# Patient Record
Sex: Female | Born: 1950
Health system: Southern US, Community
[De-identification: ages and names within clinical notes are randomized; demographics above are authoritative.]

## PROBLEM LIST (undated history)

## (undated) DIAGNOSIS — N189 Chronic kidney disease, unspecified: Secondary | ICD-10-CM

## (undated) DIAGNOSIS — I509 Heart failure, unspecified: Secondary | ICD-10-CM

## (undated) DIAGNOSIS — I639 Cerebral infarction, unspecified: Secondary | ICD-10-CM

## (undated) DIAGNOSIS — G459 Transient cerebral ischemic attack, unspecified: Secondary | ICD-10-CM

## (undated) DIAGNOSIS — K5790 Diverticulosis of intestine, part unspecified, without perforation or abscess without bleeding: Secondary | ICD-10-CM

## (undated) DIAGNOSIS — I2510 Atherosclerotic heart disease of native coronary artery without angina pectoris: Secondary | ICD-10-CM

## (undated) DIAGNOSIS — G4700 Insomnia, unspecified: Secondary | ICD-10-CM

## (undated) DIAGNOSIS — G629 Polyneuropathy, unspecified: Secondary | ICD-10-CM

## (undated) DIAGNOSIS — Z87442 Personal history of urinary calculi: Secondary | ICD-10-CM

## (undated) DIAGNOSIS — E785 Hyperlipidemia, unspecified: Secondary | ICD-10-CM

## (undated) DIAGNOSIS — I10 Essential (primary) hypertension: Secondary | ICD-10-CM

## (undated) DIAGNOSIS — E669 Obesity, unspecified: Secondary | ICD-10-CM

## (undated) DIAGNOSIS — E119 Type 2 diabetes mellitus without complications: Secondary | ICD-10-CM

## (undated) DIAGNOSIS — G4730 Sleep apnea, unspecified: Secondary | ICD-10-CM

## (undated) DIAGNOSIS — M1990 Unspecified osteoarthritis, unspecified site: Secondary | ICD-10-CM

## (undated) DIAGNOSIS — R0789 Other chest pain: Secondary | ICD-10-CM

## (undated) DIAGNOSIS — D649 Anemia, unspecified: Secondary | ICD-10-CM

## (undated) DIAGNOSIS — Z5189 Encounter for other specified aftercare: Secondary | ICD-10-CM

## (undated) DIAGNOSIS — E079 Disorder of thyroid, unspecified: Secondary | ICD-10-CM

## (undated) DIAGNOSIS — F419 Anxiety disorder, unspecified: Secondary | ICD-10-CM

## (undated) DIAGNOSIS — I209 Angina pectoris, unspecified: Secondary | ICD-10-CM

## (undated) DIAGNOSIS — L539 Erythematous condition, unspecified: Secondary | ICD-10-CM

## (undated) DIAGNOSIS — K219 Gastro-esophageal reflux disease without esophagitis: Secondary | ICD-10-CM

## (undated) DIAGNOSIS — D126 Benign neoplasm of colon, unspecified: Secondary | ICD-10-CM

## (undated) DIAGNOSIS — A419 Sepsis, unspecified organism: Secondary | ICD-10-CM

## (undated) DIAGNOSIS — I471 Supraventricular tachycardia: Secondary | ICD-10-CM

## (undated) DIAGNOSIS — G2581 Restless legs syndrome: Secondary | ICD-10-CM

## (undated) DIAGNOSIS — R251 Tremor, unspecified: Secondary | ICD-10-CM

## (undated) DIAGNOSIS — J189 Pneumonia, unspecified organism: Secondary | ICD-10-CM

## (undated) DIAGNOSIS — I4719 Other supraventricular tachycardia: Secondary | ICD-10-CM

## (undated) DIAGNOSIS — N390 Urinary tract infection, site not specified: Secondary | ICD-10-CM

## (undated) DIAGNOSIS — T8140XA Infection following a procedure, unspecified, initial encounter: Secondary | ICD-10-CM

## (undated) HISTORY — DX: Anemia, unspecified: D64.9

## (undated) HISTORY — PX: POLYPECTOMY: SHX149

## (undated) HISTORY — PX: URETERAL STENT PLACEMENT: SHX822

## (undated) HISTORY — DX: Type 2 diabetes mellitus without complications: E11.9

## (undated) HISTORY — DX: Benign neoplasm of colon, unspecified: D12.6

## (undated) HISTORY — DX: Unspecified osteoarthritis, unspecified site: M19.90

## (undated) HISTORY — DX: Obesity, unspecified: E66.9

## (undated) HISTORY — DX: Chronic kidney disease, unspecified: N18.9

## (undated) HISTORY — DX: Disorder of thyroid, unspecified: E07.9

## (undated) HISTORY — PX: JOINT REPLACEMENT: SHX530

## (undated) HISTORY — PX: COLONOSCOPY: SHX174

## (undated) HISTORY — PX: CATARACT EXTRACTION, BILATERAL: SHX1313

## (undated) HISTORY — DX: Sleep apnea, unspecified: G47.30

## (undated) HISTORY — DX: Other supraventricular tachycardia: I47.19

## (undated) HISTORY — DX: Essential (primary) hypertension: I10

## (undated) HISTORY — DX: Hyperlipidemia, unspecified: E78.5

## (undated) HISTORY — PX: SPINE SURGERY: SHX786

## (undated) HISTORY — DX: Diverticulosis of intestine, part unspecified, without perforation or abscess without bleeding: K57.90

## (undated) HISTORY — DX: Gastro-esophageal reflux disease without esophagitis: K21.9

## (undated) HISTORY — PX: CYSTOSCOPY/RETROGRADE/URETEROSCOPY/STONE EXTRACTION WITH BASKET: SHX5317

## (undated) HISTORY — DX: Anxiety disorder, unspecified: F41.9

## (undated) HISTORY — DX: Supraventricular tachycardia: I47.1

## (undated) HISTORY — PX: EYE SURGERY: SHX253

## (undated) HISTORY — DX: Tremor, unspecified: R25.1

## (undated) HISTORY — PX: TUBAL LIGATION: SHX77

## (undated) HISTORY — DX: Other chest pain: R07.89

## (undated) HISTORY — PX: UPPER GASTROINTESTINAL ENDOSCOPY: SHX188

## (undated) HISTORY — DX: Encounter for other specified aftercare: Z51.89

---
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
## 1979-05-05 HISTORY — PX: GASTRIC BYPASS: SHX52

## 1993-05-04 HISTORY — PX: ABDOMINAL HYSTERECTOMY: SHX81

## 1997-09-06 ENCOUNTER — Ambulatory Visit (HOSPITAL_COMMUNITY): Admission: RE | Admit: 1997-09-06 | Discharge: 1997-09-06 | Payer: Self-pay | Admitting: Family Medicine

## 1998-05-04 HISTORY — PX: CARPAL TUNNEL RELEASE: SHX101

## 1998-05-08 ENCOUNTER — Ambulatory Visit (HOSPITAL_BASED_OUTPATIENT_CLINIC_OR_DEPARTMENT_OTHER): Admission: RE | Admit: 1998-05-08 | Discharge: 1998-05-08 | Payer: Self-pay | Admitting: Orthopedic Surgery

## 1999-04-07 ENCOUNTER — Other Ambulatory Visit: Admission: RE | Admit: 1999-04-07 | Discharge: 1999-04-07 | Payer: Self-pay | Admitting: Gynecology

## 1999-05-05 HISTORY — PX: LAPAROSCOPIC CHOLECYSTECTOMY: SUR755

## 1999-11-21 ENCOUNTER — Encounter: Payer: Self-pay | Admitting: Family Medicine

## 1999-11-21 ENCOUNTER — Encounter: Admission: RE | Admit: 1999-11-21 | Discharge: 1999-11-21 | Payer: Self-pay | Admitting: Family Medicine

## 1999-11-21 ENCOUNTER — Encounter (INDEPENDENT_AMBULATORY_CARE_PROVIDER_SITE_OTHER): Payer: Self-pay | Admitting: *Deleted

## 1999-12-02 ENCOUNTER — Encounter: Payer: Self-pay | Admitting: *Deleted

## 1999-12-02 ENCOUNTER — Inpatient Hospital Stay (HOSPITAL_COMMUNITY): Admission: EM | Admit: 1999-12-02 | Discharge: 1999-12-03 | Payer: Self-pay | Admitting: Emergency Medicine

## 1999-12-25 ENCOUNTER — Encounter: Payer: Self-pay | Admitting: General Surgery

## 1999-12-29 ENCOUNTER — Ambulatory Visit (HOSPITAL_COMMUNITY): Admission: RE | Admit: 1999-12-29 | Discharge: 1999-12-30 | Payer: Self-pay | Admitting: General Surgery

## 1999-12-29 ENCOUNTER — Encounter (INDEPENDENT_AMBULATORY_CARE_PROVIDER_SITE_OTHER): Payer: Self-pay | Admitting: *Deleted

## 1999-12-29 ENCOUNTER — Encounter (INDEPENDENT_AMBULATORY_CARE_PROVIDER_SITE_OTHER): Payer: Self-pay | Admitting: Specialist

## 2000-01-19 ENCOUNTER — Encounter: Admission: RE | Admit: 2000-01-19 | Discharge: 2000-01-19 | Payer: Self-pay | Admitting: Family Medicine

## 2000-01-19 ENCOUNTER — Encounter: Payer: Self-pay | Admitting: Family Medicine

## 2000-01-20 ENCOUNTER — Ambulatory Visit (HOSPITAL_COMMUNITY): Admission: RE | Admit: 2000-01-20 | Discharge: 2000-01-20 | Payer: Self-pay | Admitting: Family Medicine

## 2000-05-03 ENCOUNTER — Other Ambulatory Visit: Admission: RE | Admit: 2000-05-03 | Discharge: 2000-05-03 | Payer: Self-pay | Admitting: Gynecology

## 2000-05-24 ENCOUNTER — Inpatient Hospital Stay (HOSPITAL_COMMUNITY): Admission: EM | Admit: 2000-05-24 | Discharge: 2000-05-26 | Payer: Self-pay | Admitting: Emergency Medicine

## 2000-05-24 ENCOUNTER — Encounter (INDEPENDENT_AMBULATORY_CARE_PROVIDER_SITE_OTHER): Payer: Self-pay | Admitting: *Deleted

## 2000-05-24 ENCOUNTER — Encounter: Payer: Self-pay | Admitting: Emergency Medicine

## 2000-06-03 ENCOUNTER — Encounter: Payer: Self-pay | Admitting: Urology

## 2000-06-03 ENCOUNTER — Ambulatory Visit (HOSPITAL_COMMUNITY): Admission: RE | Admit: 2000-06-03 | Discharge: 2000-06-03 | Payer: Self-pay | Admitting: Urology

## 2000-08-12 ENCOUNTER — Ambulatory Visit (HOSPITAL_COMMUNITY): Admission: RE | Admit: 2000-08-12 | Discharge: 2000-08-12 | Payer: Self-pay | Admitting: Urology

## 2000-08-12 ENCOUNTER — Encounter: Payer: Self-pay | Admitting: Urology

## 2001-03-28 ENCOUNTER — Other Ambulatory Visit: Admission: RE | Admit: 2001-03-28 | Discharge: 2001-03-28 | Payer: Self-pay | Admitting: Gynecology

## 2001-10-03 ENCOUNTER — Emergency Department (HOSPITAL_COMMUNITY): Admission: EM | Admit: 2001-10-03 | Discharge: 2001-10-03 | Payer: Self-pay | Admitting: Emergency Medicine

## 2001-10-03 ENCOUNTER — Encounter (INDEPENDENT_AMBULATORY_CARE_PROVIDER_SITE_OTHER): Payer: Self-pay | Admitting: *Deleted

## 2001-10-10 ENCOUNTER — Encounter: Payer: Self-pay | Admitting: Family Medicine

## 2001-10-10 ENCOUNTER — Encounter: Admission: RE | Admit: 2001-10-10 | Discharge: 2001-10-10 | Payer: Self-pay | Admitting: Family Medicine

## 2002-03-19 ENCOUNTER — Ambulatory Visit (HOSPITAL_BASED_OUTPATIENT_CLINIC_OR_DEPARTMENT_OTHER): Admission: RE | Admit: 2002-03-19 | Discharge: 2002-03-19 | Payer: Self-pay | Admitting: Internal Medicine

## 2002-04-03 ENCOUNTER — Other Ambulatory Visit: Admission: RE | Admit: 2002-04-03 | Discharge: 2002-04-03 | Payer: Self-pay | Admitting: Gynecology

## 2002-04-20 ENCOUNTER — Encounter: Payer: Self-pay | Admitting: Internal Medicine

## 2002-04-26 ENCOUNTER — Ambulatory Visit (HOSPITAL_COMMUNITY): Admission: RE | Admit: 2002-04-26 | Discharge: 2002-04-26 | Payer: Self-pay | Admitting: Internal Medicine

## 2002-04-26 ENCOUNTER — Encounter: Payer: Self-pay | Admitting: Internal Medicine

## 2002-04-28 ENCOUNTER — Encounter: Payer: Self-pay | Admitting: Internal Medicine

## 2002-04-28 ENCOUNTER — Ambulatory Visit (HOSPITAL_COMMUNITY): Admission: RE | Admit: 2002-04-28 | Discharge: 2002-04-28 | Payer: Self-pay | Admitting: Internal Medicine

## 2002-04-28 ENCOUNTER — Encounter (INDEPENDENT_AMBULATORY_CARE_PROVIDER_SITE_OTHER): Payer: Self-pay | Admitting: Specialist

## 2002-09-02 ENCOUNTER — Encounter: Payer: Self-pay | Admitting: Urology

## 2002-09-02 ENCOUNTER — Encounter: Payer: Self-pay | Admitting: Emergency Medicine

## 2002-09-02 ENCOUNTER — Inpatient Hospital Stay (HOSPITAL_COMMUNITY): Admission: EM | Admit: 2002-09-02 | Discharge: 2002-09-06 | Payer: Self-pay | Admitting: Emergency Medicine

## 2002-09-02 ENCOUNTER — Encounter (INDEPENDENT_AMBULATORY_CARE_PROVIDER_SITE_OTHER): Payer: Self-pay | Admitting: *Deleted

## 2002-09-13 ENCOUNTER — Ambulatory Visit (HOSPITAL_BASED_OUTPATIENT_CLINIC_OR_DEPARTMENT_OTHER): Admission: RE | Admit: 2002-09-13 | Discharge: 2002-09-13 | Payer: Self-pay | Admitting: Urology

## 2003-05-07 ENCOUNTER — Other Ambulatory Visit: Admission: RE | Admit: 2003-05-07 | Discharge: 2003-05-07 | Payer: Self-pay | Admitting: Gynecology

## 2004-03-26 ENCOUNTER — Other Ambulatory Visit: Admission: RE | Admit: 2004-03-26 | Discharge: 2004-03-26 | Payer: Self-pay | Admitting: Gynecology

## 2004-07-08 ENCOUNTER — Encounter: Admission: RE | Admit: 2004-07-08 | Discharge: 2004-07-08 | Payer: Self-pay | Admitting: Cardiology

## 2004-07-10 ENCOUNTER — Inpatient Hospital Stay (HOSPITAL_BASED_OUTPATIENT_CLINIC_OR_DEPARTMENT_OTHER): Admission: RE | Admit: 2004-07-10 | Discharge: 2004-07-10 | Payer: Self-pay | Admitting: Cardiology

## 2004-07-10 HISTORY — PX: CARDIAC CATHETERIZATION: SHX172

## 2005-03-10 ENCOUNTER — Encounter: Admission: RE | Admit: 2005-03-10 | Discharge: 2005-03-10 | Payer: Self-pay | Admitting: Family Medicine

## 2005-03-30 ENCOUNTER — Ambulatory Visit: Payer: Self-pay | Admitting: Internal Medicine

## 2005-04-09 ENCOUNTER — Ambulatory Visit: Payer: Self-pay | Admitting: Internal Medicine

## 2005-04-09 ENCOUNTER — Encounter (INDEPENDENT_AMBULATORY_CARE_PROVIDER_SITE_OTHER): Payer: Self-pay | Admitting: Specialist

## 2005-08-03 ENCOUNTER — Other Ambulatory Visit: Admission: RE | Admit: 2005-08-03 | Discharge: 2005-08-03 | Payer: Self-pay | Admitting: Gynecology

## 2005-08-25 ENCOUNTER — Encounter: Admission: RE | Admit: 2005-08-25 | Discharge: 2005-08-25 | Payer: Self-pay | Admitting: Family Medicine

## 2006-09-07 ENCOUNTER — Encounter: Payer: Self-pay | Admitting: Family Medicine

## 2007-08-06 ENCOUNTER — Encounter: Payer: Self-pay | Admitting: Family Medicine

## 2007-08-07 ENCOUNTER — Encounter (INDEPENDENT_AMBULATORY_CARE_PROVIDER_SITE_OTHER): Payer: Self-pay | Admitting: *Deleted

## 2007-08-07 LAB — CONVERTED CEMR LAB
Cholesterol: 167 mg/dL
HDL: 47 mg/dL
LDL Cholesterol: 66 mg/dL
Triglycerides: 268 mg/dL

## 2007-09-20 ENCOUNTER — Encounter: Payer: Self-pay | Admitting: Family Medicine

## 2007-09-21 ENCOUNTER — Encounter (INDEPENDENT_AMBULATORY_CARE_PROVIDER_SITE_OTHER): Payer: Self-pay | Admitting: *Deleted

## 2007-09-21 LAB — CONVERTED CEMR LAB: Vit D, 25-Hydroxy: 27 ng/mL

## 2007-10-12 ENCOUNTER — Ambulatory Visit (HOSPITAL_COMMUNITY): Admission: RE | Admit: 2007-10-12 | Discharge: 2007-10-13 | Payer: Self-pay | Admitting: Neurosurgery

## 2007-10-12 HISTORY — PX: CERVICAL FUSION: SHX112

## 2008-02-24 ENCOUNTER — Inpatient Hospital Stay (HOSPITAL_COMMUNITY): Admission: AD | Admit: 2008-02-24 | Discharge: 2008-02-25 | Payer: Self-pay | Admitting: Urology

## 2008-02-24 ENCOUNTER — Encounter (INDEPENDENT_AMBULATORY_CARE_PROVIDER_SITE_OTHER): Payer: Self-pay | Admitting: *Deleted

## 2008-02-24 ENCOUNTER — Encounter: Payer: Self-pay | Admitting: Urology

## 2008-02-28 ENCOUNTER — Ambulatory Visit (HOSPITAL_BASED_OUTPATIENT_CLINIC_OR_DEPARTMENT_OTHER): Admission: RE | Admit: 2008-02-28 | Discharge: 2008-02-28 | Payer: Self-pay | Admitting: Urology

## 2008-05-04 HISTORY — PX: TRIGGER FINGER RELEASE: SHX641

## 2008-12-24 ENCOUNTER — Encounter: Payer: Self-pay | Admitting: Family Medicine

## 2008-12-24 ENCOUNTER — Encounter (INDEPENDENT_AMBULATORY_CARE_PROVIDER_SITE_OTHER): Payer: Self-pay | Admitting: *Deleted

## 2008-12-24 LAB — CONVERTED CEMR LAB
CO2, serum: 24 mmol/L
Calcium: 9.2 mg/dL
Creatinine, Ser: 0.9 mg/dL
Globulin: 2.2 g/dL
Hemoglobin: 11.7 g/dL
Platelets: 204 10*3/uL
RBC count: 4.12 10*6/uL
Sodium, serum: 140 mmol/L
Total Bilirubin: 0.2 mg/dL

## 2008-12-27 ENCOUNTER — Encounter (INDEPENDENT_AMBULATORY_CARE_PROVIDER_SITE_OTHER): Payer: Self-pay | Admitting: *Deleted

## 2009-01-08 ENCOUNTER — Ambulatory Visit: Payer: Self-pay | Admitting: Family Medicine

## 2009-01-08 DIAGNOSIS — IMO0002 Reserved for concepts with insufficient information to code with codable children: Secondary | ICD-10-CM | POA: Insufficient documentation

## 2009-01-08 DIAGNOSIS — D649 Anemia, unspecified: Secondary | ICD-10-CM | POA: Insufficient documentation

## 2009-01-08 DIAGNOSIS — E1165 Type 2 diabetes mellitus with hyperglycemia: Secondary | ICD-10-CM

## 2009-01-08 DIAGNOSIS — E1169 Type 2 diabetes mellitus with other specified complication: Secondary | ICD-10-CM

## 2009-01-08 DIAGNOSIS — I110 Hypertensive heart disease with heart failure: Secondary | ICD-10-CM

## 2009-01-08 DIAGNOSIS — E1140 Type 2 diabetes mellitus with diabetic neuropathy, unspecified: Secondary | ICD-10-CM

## 2009-01-08 DIAGNOSIS — K219 Gastro-esophageal reflux disease without esophagitis: Secondary | ICD-10-CM

## 2009-01-08 DIAGNOSIS — E785 Hyperlipidemia, unspecified: Secondary | ICD-10-CM

## 2009-01-10 ENCOUNTER — Encounter (INDEPENDENT_AMBULATORY_CARE_PROVIDER_SITE_OTHER): Payer: Self-pay | Admitting: *Deleted

## 2009-02-05 ENCOUNTER — Ambulatory Visit: Payer: Self-pay | Admitting: Family Medicine

## 2009-02-05 DIAGNOSIS — R079 Chest pain, unspecified: Secondary | ICD-10-CM

## 2009-02-05 DIAGNOSIS — M79609 Pain in unspecified limb: Secondary | ICD-10-CM

## 2009-02-06 ENCOUNTER — Encounter (INDEPENDENT_AMBULATORY_CARE_PROVIDER_SITE_OTHER): Payer: Self-pay | Admitting: *Deleted

## 2009-02-06 LAB — CONVERTED CEMR LAB
Calcium: 9 mg/dL (ref 8.4–10.5)
Chloride: 108 meq/L (ref 96–112)
GFR calc non Af Amer: 68.18 mL/min (ref >60)

## 2009-02-11 ENCOUNTER — Encounter: Admission: RE | Admit: 2009-02-11 | Discharge: 2009-02-11 | Payer: Self-pay | Admitting: Cardiology

## 2009-02-12 ENCOUNTER — Encounter: Payer: Self-pay | Admitting: Family Medicine

## 2009-02-14 ENCOUNTER — Encounter: Payer: Self-pay | Admitting: Family Medicine

## 2009-03-20 ENCOUNTER — Encounter: Payer: Self-pay | Admitting: Internal Medicine

## 2009-05-30 ENCOUNTER — Ambulatory Visit: Payer: Self-pay | Admitting: Family Medicine

## 2009-05-30 DIAGNOSIS — R300 Dysuria: Secondary | ICD-10-CM | POA: Insufficient documentation

## 2009-05-30 LAB — CONVERTED CEMR LAB
Urobilinogen, UA: 0.2
WBC Urine, dipstick: NEGATIVE
pH: 6

## 2009-05-31 ENCOUNTER — Encounter: Payer: Self-pay | Admitting: Family Medicine

## 2009-06-03 ENCOUNTER — Telehealth (INDEPENDENT_AMBULATORY_CARE_PROVIDER_SITE_OTHER): Payer: Self-pay | Admitting: *Deleted

## 2009-06-11 ENCOUNTER — Telehealth (INDEPENDENT_AMBULATORY_CARE_PROVIDER_SITE_OTHER): Payer: Self-pay | Admitting: *Deleted

## 2009-06-12 ENCOUNTER — Encounter: Payer: Self-pay | Admitting: Family Medicine

## 2009-07-05 ENCOUNTER — Encounter: Payer: Self-pay | Admitting: Family Medicine

## 2009-07-12 ENCOUNTER — Encounter: Admission: RE | Admit: 2009-07-12 | Discharge: 2009-10-10 | Payer: Self-pay | Admitting: Anesthesiology

## 2009-07-16 ENCOUNTER — Ambulatory Visit: Payer: Self-pay | Admitting: Anesthesiology

## 2009-07-20 ENCOUNTER — Encounter: Admission: RE | Admit: 2009-07-20 | Discharge: 2009-07-20 | Payer: Self-pay | Admitting: Neurosurgery

## 2009-08-01 ENCOUNTER — Encounter: Payer: Self-pay | Admitting: Family Medicine

## 2009-08-20 ENCOUNTER — Ambulatory Visit: Payer: Self-pay | Admitting: Anesthesiology

## 2009-09-01 HISTORY — PX: OTHER SURGICAL HISTORY: SHX169

## 2009-09-04 ENCOUNTER — Encounter: Payer: Self-pay | Admitting: Family Medicine

## 2009-09-05 ENCOUNTER — Encounter (INDEPENDENT_AMBULATORY_CARE_PROVIDER_SITE_OTHER): Payer: Self-pay | Admitting: *Deleted

## 2009-11-01 ENCOUNTER — Ambulatory Visit: Payer: Self-pay | Admitting: Family Medicine

## 2009-11-03 LAB — CONVERTED CEMR LAB: Vit D, 25-Hydroxy: 48 ng/mL (ref 30–89)

## 2009-11-05 ENCOUNTER — Telehealth (INDEPENDENT_AMBULATORY_CARE_PROVIDER_SITE_OTHER): Payer: Self-pay | Admitting: *Deleted

## 2009-11-05 LAB — CONVERTED CEMR LAB
AST: 18 units/L (ref 0–37)
Basophils Relative: 0.7 % (ref 0.0–3.0)
Bilirubin, Direct: 0.1 mg/dL (ref 0.0–0.3)
Calcium: 9.6 mg/dL (ref 8.4–10.5)
Cholesterol: 178 mg/dL (ref 0–200)
GFR calc non Af Amer: 65.48 mL/min (ref >60)
Glucose, Bld: 112 mg/dL — ABNORMAL HIGH (ref 70–99)
HCT: 38.2 % (ref 36.0–46.0)
Hgb A1c MFr Bld: 6.9 % — ABNORMAL HIGH (ref 4.6–6.5)
LDL Cholesterol: 96 mg/dL (ref 0–99)
MCHC: 34.2 g/dL (ref 30.0–36.0)
MCV: 93.9 fL (ref 78.0–100.0)
Monocytes Absolute: 0.5 10*3/uL (ref 0.1–1.0)
Neutro Abs: 5.8 10*3/uL (ref 1.4–7.7)
Platelets: 235 10*3/uL (ref 150.0–400.0)
RBC: 4.07 M/uL (ref 3.87–5.11)
TSH: 2.2 microintl units/mL (ref 0.35–5.50)
Triglycerides: 187 mg/dL — ABNORMAL HIGH (ref 0.0–149.0)
VLDL: 37.4 mg/dL (ref 0.0–40.0)
WBC: 9.2 10*3/uL (ref 4.5–10.5)

## 2009-11-13 ENCOUNTER — Encounter: Admission: RE | Admit: 2009-11-13 | Discharge: 2010-02-11 | Payer: Self-pay | Admitting: Anesthesiology

## 2009-11-15 ENCOUNTER — Encounter: Payer: Self-pay | Admitting: Family Medicine

## 2009-11-15 LAB — HM MAMMOGRAPHY: HM Mammogram: NORMAL

## 2009-11-19 ENCOUNTER — Ambulatory Visit: Payer: Self-pay | Admitting: Anesthesiology

## 2009-11-19 ENCOUNTER — Ambulatory Visit: Payer: Self-pay | Admitting: Family Medicine

## 2009-11-20 ENCOUNTER — Encounter: Payer: Self-pay | Admitting: Family Medicine

## 2009-11-20 LAB — HM DIABETES EYE EXAM: HM Diabetic Eye Exam: NORMAL

## 2009-11-21 ENCOUNTER — Encounter (INDEPENDENT_AMBULATORY_CARE_PROVIDER_SITE_OTHER): Payer: Self-pay | Admitting: *Deleted

## 2009-12-19 ENCOUNTER — Ambulatory Visit: Payer: Self-pay | Admitting: Physical Medicine & Rehabilitation

## 2009-12-19 ENCOUNTER — Encounter
Admission: RE | Admit: 2009-12-19 | Discharge: 2010-03-19 | Payer: Self-pay | Admitting: Physical Medicine & Rehabilitation

## 2010-01-14 ENCOUNTER — Ambulatory Visit: Payer: Self-pay | Admitting: Anesthesiology

## 2010-01-20 ENCOUNTER — Ambulatory Visit: Payer: Self-pay | Admitting: Family Medicine

## 2010-01-20 DIAGNOSIS — G4700 Insomnia, unspecified: Secondary | ICD-10-CM | POA: Insufficient documentation

## 2010-01-20 DIAGNOSIS — G2581 Restless legs syndrome: Secondary | ICD-10-CM | POA: Insufficient documentation

## 2010-01-20 DIAGNOSIS — E559 Vitamin D deficiency, unspecified: Secondary | ICD-10-CM

## 2010-01-20 LAB — CONVERTED CEMR LAB
BUN: 19 mg/dL (ref 6–23)
CO2: 27 meq/L (ref 19–32)
Glucose, Bld: 101 mg/dL — ABNORMAL HIGH (ref 70–99)

## 2010-02-03 ENCOUNTER — Encounter: Payer: Self-pay | Admitting: Family Medicine

## 2010-02-03 ENCOUNTER — Telehealth (INDEPENDENT_AMBULATORY_CARE_PROVIDER_SITE_OTHER): Payer: Self-pay | Admitting: *Deleted

## 2010-02-04 ENCOUNTER — Telehealth: Payer: Self-pay | Admitting: Family Medicine

## 2010-02-06 ENCOUNTER — Encounter
Admission: RE | Admit: 2010-02-06 | Discharge: 2010-02-11 | Payer: Self-pay | Source: Home / Self Care | Attending: Physical Medicine & Rehabilitation | Admitting: Physical Medicine & Rehabilitation

## 2010-02-11 ENCOUNTER — Ambulatory Visit: Payer: Self-pay | Admitting: Physical Medicine & Rehabilitation

## 2010-03-06 ENCOUNTER — Encounter: Payer: Self-pay | Admitting: Internal Medicine

## 2010-03-19 ENCOUNTER — Encounter (INDEPENDENT_AMBULATORY_CARE_PROVIDER_SITE_OTHER): Payer: Self-pay | Admitting: *Deleted

## 2010-04-02 ENCOUNTER — Encounter: Payer: Self-pay | Admitting: Family Medicine

## 2010-04-17 ENCOUNTER — Encounter: Payer: Self-pay | Admitting: Family Medicine

## 2010-04-18 ENCOUNTER — Encounter
Admission: RE | Admit: 2010-04-18 | Discharge: 2010-06-03 | Payer: Self-pay | Source: Home / Self Care | Attending: Physical Medicine & Rehabilitation | Admitting: Physical Medicine & Rehabilitation

## 2010-04-24 ENCOUNTER — Ambulatory Visit: Payer: Self-pay | Admitting: Family Medicine

## 2010-04-24 ENCOUNTER — Encounter: Payer: Self-pay | Admitting: Family Medicine

## 2010-04-24 ENCOUNTER — Ambulatory Visit: Payer: Self-pay | Admitting: Physical Medicine & Rehabilitation

## 2010-04-24 ENCOUNTER — Encounter: Payer: Self-pay | Admitting: Internal Medicine

## 2010-04-24 ENCOUNTER — Ambulatory Visit: Payer: Self-pay | Admitting: Cardiology

## 2010-04-24 LAB — CONVERTED CEMR LAB
AST: 18 units/L (ref 0–37)
Albumin: 3.8 g/dL (ref 3.5–5.2)
Basophils Relative: 0.7 % (ref 0.0–3.0)
CO2: 25 meq/L (ref 19–32)
Calcium: 9.2 mg/dL (ref 8.4–10.5)
Chloride: 105 meq/L (ref 96–112)
Cholesterol: 143 mg/dL (ref 0–200)
Eosinophils Absolute: 0.4 10*3/uL (ref 0.0–0.7)
GFR calc non Af Amer: 80.08 mL/min (ref >60.00)
HCT: 34.7 % — ABNORMAL LOW (ref 36.0–46.0)
Hgb A1c MFr Bld: 7.2 % — ABNORMAL HIGH (ref 4.6–6.5)
LDL Cholesterol: 70 mg/dL (ref 0–99)
Lymphs Abs: 2.5 10*3/uL (ref 0.7–4.0)
MCHC: 33.7 g/dL (ref 30.0–36.0)
MCV: 89.6 fL (ref 78.0–100.0)
Monocytes Absolute: 0.6 10*3/uL (ref 0.1–1.0)
Neutro Abs: 5.6 10*3/uL (ref 1.4–7.7)
Neutrophils Relative %: 61.1 % (ref 43.0–77.0)
Potassium: 4.1 meq/L (ref 3.5–5.1)
Total Bilirubin: 0.4 mg/dL (ref 0.3–1.2)
Total Protein: 6.4 g/dL (ref 6.0–8.3)
Triglycerides: 186 mg/dL — ABNORMAL HIGH (ref 0.0–149.0)
WBC: 9.2 10*3/uL (ref 4.5–10.5)

## 2010-04-29 ENCOUNTER — Telehealth (INDEPENDENT_AMBULATORY_CARE_PROVIDER_SITE_OTHER): Payer: Self-pay | Admitting: *Deleted

## 2010-04-30 ENCOUNTER — Encounter (HOSPITAL_COMMUNITY)
Admission: RE | Admit: 2010-04-30 | Discharge: 2010-06-03 | Payer: Self-pay | Source: Home / Self Care | Attending: Cardiology | Admitting: Cardiology

## 2010-04-30 ENCOUNTER — Ambulatory Visit: Payer: Self-pay | Admitting: Cardiology

## 2010-04-30 ENCOUNTER — Ambulatory Visit: Payer: Self-pay

## 2010-04-30 ENCOUNTER — Encounter: Payer: Self-pay | Admitting: Cardiology

## 2010-04-30 ENCOUNTER — Encounter: Payer: Self-pay | Admitting: Family Medicine

## 2010-04-30 ENCOUNTER — Ambulatory Visit (HOSPITAL_COMMUNITY)
Admission: RE | Admit: 2010-04-30 | Discharge: 2010-04-30 | Payer: Self-pay | Source: Home / Self Care | Attending: Cardiology | Admitting: Cardiology

## 2010-05-01 ENCOUNTER — Ambulatory Visit: Payer: Self-pay | Admitting: Internal Medicine

## 2010-05-01 DIAGNOSIS — Z860100 Personal history of colon polyps, unspecified: Secondary | ICD-10-CM | POA: Insufficient documentation

## 2010-05-01 DIAGNOSIS — R198 Other specified symptoms and signs involving the digestive system and abdomen: Secondary | ICD-10-CM

## 2010-05-02 ENCOUNTER — Telehealth (INDEPENDENT_AMBULATORY_CARE_PROVIDER_SITE_OTHER): Payer: Self-pay | Admitting: *Deleted

## 2010-05-04 DIAGNOSIS — Z87442 Personal history of urinary calculi: Secondary | ICD-10-CM

## 2010-05-04 HISTORY — DX: Personal history of urinary calculi: Z87.442

## 2010-05-07 ENCOUNTER — Telehealth: Payer: Self-pay | Admitting: Cardiology

## 2010-05-07 ENCOUNTER — Encounter: Payer: Self-pay | Admitting: Cardiology

## 2010-05-08 ENCOUNTER — Telehealth: Payer: Self-pay | Admitting: Cardiology

## 2010-05-27 ENCOUNTER — Telehealth (INDEPENDENT_AMBULATORY_CARE_PROVIDER_SITE_OTHER): Payer: Self-pay | Admitting: *Deleted

## 2010-05-28 ENCOUNTER — Telehealth (INDEPENDENT_AMBULATORY_CARE_PROVIDER_SITE_OTHER): Payer: Self-pay | Admitting: *Deleted

## 2010-06-02 ENCOUNTER — Telehealth (INDEPENDENT_AMBULATORY_CARE_PROVIDER_SITE_OTHER): Payer: Self-pay | Admitting: *Deleted

## 2010-06-05 NOTE — Letter (Signed)
Summary: [facility]  [facility]   Imported By: Lanelle Bal 04/25/2010 12:49:31  _____________________________________________________________________  External Attachment:    Type:   Image     Comment:   External Document

## 2010-06-05 NOTE — Assessment & Plan Note (Signed)
Summary: Cardiology Nuclear Testing  Nuclear Med Background Indications for Stress Test: Evaluation for Ischemia   History: Heart Catheterization, Myocardial Perfusion Study  History Comments: '01 ZOX:WRUEAV, EF=67%; '06 Cath:normal, EF=55%  Symptoms: Chest Pain, Chest Pain with Exertion, Chest Pressure, Chest Pressure with Exertion, Chest Tightness, Chest Tightness with Exertion, Dizziness, DOE, Fatigue, Palpitations, Rapid HR  Symptoms Comments: Last episode of WU:JWJX a.m., 5/10; none now.   Nuclear Pre-Procedure Cardiac Risk Factors: Family History - CAD, Hypertension, IDDM Type 2, Lipids, Obesity, Smoker Caffeine/Decaff Intake: none NPO After: 7:30 AM Lungs: Clear.   IV 0.9% NS with Angio Cath: 22g     IV Site: R Hand IV Started by: Cathlyn Parsons, RN Chest Size (in) 40     Cup Size D     Height (in): 62.5 Weight (lb): 212 BMI: 38.30 Tech Comments: Patient BS at home 145 and she took no insulin. Recheck BS 100 at 1230.  Nuclear Med Study 1 or 2 day study:  1 day     Stress Test Type:  Stress Reading MD:  Olga Millers, MD     Referring MD:  Charlies Constable, MD Resting Radionuclide:  Technetium 14m Tetrofosmin     Resting Radionuclide Dose:  11 mCi  Stress Radionuclide:  Technetium 20m Tetrofosmin     Stress Radionuclide Dose:  33 mCi   Stress Protocol Exercise Time (min):  5:16 min     Max HR:  153 bpm     Predicted Max HR:  161 bpm  Max Systolic BP: 189 mm Hg     Percent Max HR:  95.03 %     METS: 7.0 Rate Pressure Product:  91478    Stress Test Technologist:  Rea College, CMA-N     Nuclear Technologist:  Doyne Keel, CNMT  Rest Procedure  Myocardial perfusion imaging was performed at rest 45 minutes following the intravenous administration of Technetium 75m Tetrofosmin.  Stress Procedure  The patient exercised for 5:16.  The patient stopped due to dyspnea and fatigue.  He did c/o chest tightness, 3-4/10, with exercise.  There were no diagnostic ST-T wave  changes, only occasional PVC's and PAC's.  Technetium 62m Tetrofosmin was injected at peak exercise and myocardial perfusion imaging was performed after a brief delay.  QPS Raw Data Images:  Acquisition technically good; normal left ventricular size. Stress Images:  Normal homogeneous uptake in all areas of the myocardium. Rest Images:  Normal homogeneous uptake in all areas of the myocardium. Subtraction (SDS):  No evidence of ischemia. Transient Ischemic Dilatation:  .90  (Normal <1.22)  Lung/Heart Ratio:  .27  (Normal <0.45)  Quantitative Gated Spect Images QGS EDV:  70 ml QGS ESV:  26 ml QGS EF:  63 % QGS cine images:  Normal wall motion.   Overall Impression  Exercise Capacity: Fair exercise capacity. BP Response: Normal blood pressure response. Clinical Symptoms: There is chest pain ECG Impression: No significant ST segment change suggestive of ischemia. Overall Impression: Normal stress nuclear study with no ischemia or infarction.  Appended Document: Cardiology Nuclear Testing pt notified of results

## 2010-06-05 NOTE — Assessment & Plan Note (Signed)
Summary: ACUTE FOR A UTI   Vital Signs:  Patient profile:   60 year old female Weight:      210.50 pounds Temp:     98.4 degrees F oral Pulse rate:   84 / minute Pulse rhythm:   regular BP sitting:   120 / 60  (left arm) Cuff size:   regular  Vitals Entered By: Army Fossa CMA (May 30, 2009 3:17 PM) CC: Pt states she has pressure when urinating x 3 weeks, not so much painful when urinating. , Dysuria   History of Present Illness:  Dysuria      This is a 60 year old woman who presents with Dysuria.  The symptoms began 3 weeks ago.  Pt has had urological w/u  --  this feels like her typical infection.  The patient complains of urinary frequency, but denies burning with urination, urgency, hematuria, vaginal discharge, vaginal itching, vaginal sores, and penile discharge.  The patient denies the following associated symptoms: nausea, vomiting, fever, shaking chills, flank pain, abdominal pain, back pain, pelvic pain, and arthralgias.  Risk factors for urinary tract infection include diabetes.  The patient denies the following risk factors: immunosuppression, history of GU anomaly, history of pyelonephritis, pregnancy, history of STD, and analgesic abuse.  History is significant for recent instrumentation.    Allergies (verified): No Known Drug Allergies  Physical Exam  General:  Well-developed,well-nourished,in no acute distress; alert,appropriate and cooperative throughout examination Psych:  Oriented X3 and normally interactive.     Impression & Recommendations:  Problem # 1:  DYSURIA (ICD-788.1)  Her updated medication list for this problem includes:    Cipro 500 Mg Tabs (Ciprofloxacin hcl) .Marland Kitchen... 1 by mouth two times a day  Orders: T-Culture, Urine (09811-91478) UA Dipstick w/o Micro (manual) (29562)  Encouraged to push clear liquids, get enough rest, and take acetaminophen as needed. To be seen in 10 days if no improvement, sooner if worse.  Complete Medication  List: 1)  Lisinopril-hydrochlorothiazide 20-12.5 Mg Tabs (Lisinopril-hydrochlorothiazide) .Marland Kitchen.. 1 tablet by mouth daily 2)  Meloxicam 15 Mg Tabs (Meloxicam) .... Take one tablet daily 3)  Trilipix 135 Mg Cpdr (Choline fenofibrate) .... Take one tablet daily 4)  Oxycodone-acetaminophen 5-325 Mg Tabs (Oxycodone-acetaminophen) .... Take 1-3 tablet every 6 hours as needed 5)  Simvastatin 40 Mg Tabs (Simvastatin) .... Take one tablet daily 6)  Vitamin D (ergocalciferol) 50000 Unit Caps (Ergocalciferol) .... Take one tablet every 2 weeks 7)  Ferrous Sulfate 325 (65 Fe) Mg Tbec (Ferrous sulfate) .... Take one tablet daily 8)  Trazodone Hcl 150 Mg Tabs (Trazodone hcl) .... Take 1-2 at bedtime as needed 9)  Metformin Hcl 1000 Mg Tabs (Metformin hcl) .... Take one tablet two times a day 10)  Hair Grain Vitamin  .... Take one tablet daily 11)  Calcium-magnesium-zinc 750-375-15 Mg Tabs (Calcium-magnesium-zinc) .... Take one tablet daily 12)  Orphenadrine Citrate Cr 100 Mg Xr12h-tab (Orphenadrine citrate) .... Take one tablet two times a day 13)  D-3 1000iu  .... Take one tablet daily 14)  Hydrocortisone 2.5 % Crea (Hydrocortisone) .... Use as directed 15)  Novolog Flexpen 100 Unit/ml Soln (Insulin aspart) .... 6/8/8 units daily 16)  Betamethasone Dipropionate 0.05 % Crea (Betamethasone dipropionate) .... Apply every 12 hrs to affected area 17)  Lantus Solostar 100 Unit/ml Soln (Insulin glargine) .... Inject 52 units daily 18)  [facility] Joseph Aspirin 81 Mg Tbec (Aspirin) .... Take one tablet daily 19)  Amlodipine Besylate 10 Mg Tabs (Amlodipine besylate) .... Once daily  20)  Flexeril 10 Mg Tabs (Cyclobenzaprine hcl) .Marland Kitchen.. 1 by mouth three times a day 21)  Cipro 500 Mg Tabs (Ciprofloxacin hcl) .Marland Kitchen.. 1 by mouth two times a day Prescriptions: CIPRO 500 MG TABS (CIPROFLOXACIN HCL) 1 by mouth two times a day  #10 x 0   Entered and Authorized by:   Loreen Freud DO   Signed by:   Loreen Freud DO on 05/30/2009   Method  used:   Electronically to        [facility]* (retail)       [address] [place], [place]  [address]       Ph: [phone] or [phone]       Fax: [phone]   RxID:   [id number]   Laboratory Results   Urine Tests    Routine Urinalysis   Color: yellow Appearance: Clear Glucose: negative   (Normal Range: Negative) Bilirubin: negative   (Normal Range: Negative) Ketone: negative   (Normal Range: Negative) Spec. Gravity: 1.015   (Normal Range: 1.003-1.035) Blood: trace-lysed   (Normal Range: Negative) pH: 6.0   (Normal Range: 5.0-8.0) Protein: trace   (Normal Range: Negative) Urobilinogen: 0.2   (Normal Range: 0-1) Nitrite: negative   (Normal Range: Negative) Leukocyte Esterace: negative   (Normal Range: Negative)    Comments: Army Fossa CMA  May 30, 2009 3:26 PM

## 2010-06-05 NOTE — Assessment & Plan Note (Signed)
Summary: 2 month followup on diabetes/kn   Vital Signs:  Patient profile:   60 year old female Menstrual status:  perimenopausal Height:      62.5 inches (158.75 cm) Weight:      208 pounds (94.55 kg) BMI:     37.57 Temp:     97.6 degrees F (36.44 degrees C) oral BP sitting:   134 / 68  (left arm) Cuff size:   large  Vitals Entered By: Lucious Groves CMA (January 20, 2010 9:32 AM) CC: 2 mo f/u on diabetes/kb Is Patient Diabetic? Yes Pain Assessment Patient in pain? no      Comments Patient notes that her reading are usually high, from 164 to 189. This mornings reading was 134 and yesterday morning was 101. Patient also notes that she needs refills office visit Vitamin D and has been taking Simvastatin instead of Lipitor./kb   History of Present Illness: 60 yo woman here today for f/u on  1) DM- reports most AM CBGs are 164-189.  recently AM readings 101-134.  Lantus now 56 units nightly.  will increase to 58 units as needed.  no symptomatic lows.  Novolog dose now 12/12/10- pt adjusting as needed based on mealtime readings.  UTD on eye exam.  no CP, SOB, HAs, visual changes, edema.  still smoking 3 cigs/day.  2) Hyperlipidemia- still taking simvastatin b/c pt had increased body aches on Lipitor.  3) Vit D Deficiency- level is normal.  no longer needs prescription strength.  4) Insomnia- reports trazodone is no longer working, having difficulty falling asleep.  would like to switch to another med that isn't habit forming.  5) restless leg- sxs in L leg only.  occur in the evening- describes pain in thigh, radiating down leg.  feels need to constantly move leg.  Current Medications (verified): 1)  Diovan Hct 160-12.5 Mg Tabs (Valsartan-Hydrochlorothiazide) .Marland Kitchen.. 1 Tab By Mouth Daily 2)  Trilipix 135 Mg Cpdr (Choline Fenofibrate) .... Take One Tablet Daily 3)  Oxycodone-Acetaminophen 5-325 Mg Tabs (Oxycodone-Acetaminophen) .... Take 1-3 Tablet Every 6 Hours As Needed 4)  Lipitor  40 Mg Tabs (Atorvastatin Calcium) .... Take One Tablet At Bedtime 5)  Vitamin D (Ergocalciferol) 50000 Unit Caps (Ergocalciferol) .... Take One Tablet Every 2 Weeks 6)  Ferrous Sulfate 325 (65 Fe) Mg Tbec (Ferrous Sulfate) .... Take One Tablet Daily 7)  Trazodone Hcl 150 Mg Tabs (Trazodone Hcl) .... Take 1-2 At Bedtime As Needed 8)  Metformin Hcl 1000 Mg Tabs (Metformin Hcl) .... Take One Tablet Two Times A Day 9)  Calcium-Magnesium-Zinc 750-375-15 Mg Tabs (Calcium-Magnesium-Zinc) .... Take One Tablet Daily 10)  D-3 1000iu .... Take One Tablet Daily 11)  Hydrocortisone Valerate 0.2 % Crea (Hydrocortisone Valerate) .... Apply Sparingly Every 8 Hours As Needed 12)  Novolog Flexpen 100 Unit/ml Soln (Insulin Aspart) .... 12/13/08 Units Daily 13)  Betamethasone Dipropionate 0.05 % Crea (Betamethasone Dipropionate) .... Apply Every 12 Hrs To Affected Area 14)  Lantus Solostar 100 Unit/ml Soln (Insulin Glargine) .... Inject 52 Units Daily 15)  [facility] Joseph Aspirin 81 Mg Tbec (Aspirin) .... Take One Tablet Daily 16)  Flexeril 10 Mg Tabs (Cyclobenzaprine Hcl) .Marland Kitchen.. 1 By Mouth Three Times A Day 17)  Nitrofurantoin Monohyd Macro 100 Mg Caps (Nitrofurantoin Monohyd Macro) .... Take One Tablet At Bedtime 18)  Nucynta 50 Mg Tabs (Tapentadol Hcl) .... Take One Tablet Three Times A Day As Needed For Pain 19)  Cartia Xt 120 Mg Xr24h-Cap (Diltiazem Hcl Coated Beads) .Marland Kitchen.. 1 Tab By  Mouth Daily 20)  Onetouch Ultra Test  Strp (Glucose Blood) .... Test Four Times Daily 21)  Onetouch Delica Lancets  Misc (Lancets) .... Test Four Times Daily 22)  Bd Pen Needle Short U/f 31g X 8 Mm Misc (Insulin Pen Needle) .... Use Four Times Daily 23)  Tramadol Hcl 50 Mg Tabs (Tramadol Hcl) .Marland Kitchen.. 1-4 Tabs By Mouth As Needed  Allergies (verified): No Known Drug Allergies  Past History:  Past Medical History: Diabetes mellitus, type II Anemia Hyperlipidemia HTN GERD Vit D deficiency Restless leg  Review of Systems      See  HPI  Physical Exam  General:  Well-developed,well-nourished,in no acute distress; alert,appropriate and cooperative throughout examination Neck:  No deformities, masses, or tenderness noted. Lungs:  Normal respiratory effort, chest expands symmetrically. Lungs are clear to auscultation, no crackles or wheezes. Heart:  Normal rate and regular rhythm. S1 and S2 normal without gallop, murmur, click, rub or other extra sounds. Abdomen:  soft, NT/ND, +BS Msk:  no TTP of L leg.  no pain w/ knee flexion/extension Pulses:  +2 carotid, radial, DP Extremities:  No clubbing, cyanosis, edema, or deformity noted Neurologic:  strength normal in all extremities, sensation intact to light touch, gait normal, and DTRs symmetrical and normal.     Impression & Recommendations:  Problem # 1:  DIABETES MELLITUS, TYPE II (ICD-250.00) Assessment Unchanged sugars remain labile.  check A1C.  will increase pt's lantus to 58 units nightly and follow closely. Her updated medication list for this problem includes:    Diovan Hct 160-12.5 Mg Tabs (Valsartan-hydrochlorothiazide) .Marland Kitchen... 1 tab by mouth daily    Metformin Hcl 1000 Mg Tabs (Metformin hcl) .Marland Kitchen... Take one tablet two times a day    Novolog Flexpen 100 Unit/ml Soln (Insulin aspart) .Marland Kitchen... 12/13/08 units daily    Lantus Solostar 100 Unit/ml Soln (Insulin glargine) ..... Inject 60 units daily    [facility] Joseph Aspirin 81 Mg Tbec (Aspirin) .Marland Kitchen... Take one tablet daily  Orders: Venipuncture ([address]) TLB-A1C / Hgb A1C (Glycohemoglobin) (83036-A1C) TLB-BMP (Basic Metabolic Panel-BMET) (80048-METABOL) Specimen Handling ([address])  Problem # 2:  HYPERLIPIDEMIA (ICD-272.4) Assessment: Unchanged pt did not tolerate Lipitor.  continuing to take her simvastatin. The following medications were removed from the medication list:    Lipitor 40 Mg Tabs (Atorvastatin calcium) .Marland Kitchen... Take one tablet at bedtime Her updated medication list for this problem includes:    Trilipix 135 Mg  Cpdr (Choline fenofibrate) .Marland Kitchen... Take one tablet daily    Simvastatin 40 Mg Tabs (Simvastatin) .Marland Kitchen... 1 tab daily  Problem # 3:  VITAMIN D DEFICIENCY (ICD-268.9) Assessment: Unchanged at last check Vit D level was normal.  continue daily supplements but no need for prescription strength repletion.  Problem # 4:  INSOMNIA (ICD-780.52) Assessment: New pt reports Trazodone no longer working.  would like to change meds.  initially thought silenor would be good choice but given pt's apparent restless leg sxs will start Klonopin.  pt understands this can be habit forming if use improperly.  will follow.  Problem # 5:  RESTLESS LEG SYNDROME (ICD-333.94) Assessment: New start Klonopin for sxs relief.  pt's leg pain sxs consistent w/ restless leg.  if no improvement on meds will refer for complete evaluation.  Pt expresses understanding and is in agreement w/ this plan.  Complete Medication List: 1)  Diovan Hct 160-12.5 Mg Tabs (Valsartan-hydrochlorothiazide) .Marland Kitchen.. 1 tab by mouth daily 2)  Trilipix 135 Mg Cpdr (Choline fenofibrate) .... Take one tablet daily 3)  Oxycodone-acetaminophen 5-325  Mg Tabs (Oxycodone-acetaminophen) .... Take 1-3 tablet every 6 hours as needed 4)  Vitamin D (ergocalciferol) 50000 Unit Caps (Ergocalciferol) .... Take one tablet every 2 weeks 5)  Ferrous Sulfate 325 (65 Fe) Mg Tbec (Ferrous sulfate) .... Take one tablet daily 6)  Trazodone Hcl 150 Mg Tabs (Trazodone hcl) .... Take 1-2 at bedtime as needed 7)  Metformin Hcl 1000 Mg Tabs (Metformin hcl) .... Take one tablet two times a day 8)  Calcium-magnesium-zinc 750-375-15 Mg Tabs (Calcium-magnesium-zinc) .... Take one tablet daily 9)  D-3 1000iu  .... Take one tablet daily 10)  Hydrocortisone Valerate 0.2 % Crea (Hydrocortisone valerate) .... Apply sparingly every 8 hours as needed 11)  Novolog Flexpen 100 Unit/ml Soln (Insulin aspart) .... 12/13/08 units daily 12)  Betamethasone Dipropionate 0.05 % Crea (Betamethasone  dipropionate) .... Apply every 12 hrs to affected area 13)  Lantus Solostar 100 Unit/ml Soln (Insulin glargine) .... Inject 60 units daily 14)  [facility] Aspirin 81 Mg Tbec (Aspirin) .... Take one tablet daily 15)  Flexeril 10 Mg Tabs (Cyclobenzaprine hcl) .Marland Kitchen.. 1 by mouth three times a day 16)  Nitrofurantoin Monohyd Macro 100 Mg Caps (Nitrofurantoin monohyd macro) .... Take one tablet at bedtime 17)  Nucynta 50 Mg Tabs (Tapentadol hcl) .... Take one tablet three times a day as needed for pain 18)  Cartia Xt 120 Mg Xr24h-cap (Diltiazem hcl coated beads) .Marland Kitchen.. 1 tab by mouth daily 19)  Onetouch Ultra Test Strp (Glucose blood) .... Test four times daily 20)  Onetouch Delica Lancets Misc (Lancets) .... Test four times daily 21)  Bd Pen Needle Short U/f 31g X 8 Mm Misc (Insulin pen needle) .... Use four times daily 22)  Tramadol Hcl 50 Mg Tabs (Tramadol hcl) .Marland Kitchen.. 1-4 tabs by mouth as needed 23)  Klonopin 1 Mg Tabs (Clonazepam) .Marland Kitchen.. 1 tab nightly for restless leg 24)  Simvastatin 40 Mg Tabs (Simvastatin) .Marland Kitchen.. 1 tab daily  Other Orders: Admin 1st Vaccine (95638) Flu Vaccine 31yrs + ([address]643)  Patient Instructions: 1)  Follow up in 3 months for regular diabetes check 2)  Take the 58 units of Lantus nightly- adjust as needed 3)  Call with any questions or concerns 4)  Continue the daily Calcium and Vit D supplements- you no longer need prescription Vit D 5)  Start the Klonopin nightly for leg pain and sleep 6)  Continue the Simvastatin nightly 7)  Hang in there! Prescriptions: KLONOPIN 1 MG TABS (CLONAZEPAM) 1 tab nightly for restless leg  #30 x 0   Entered and Authorized by:   Neena Rhymes MD   Signed by:   Neena Rhymes MD on 01/20/2010   Method used:   Print then Give to Patient   RxID:   [id number] LANTUS SOLOSTAR 100 UNIT/ML SOLN (INSULIN GLARGINE) inject 60 units daily  #3 month x 3   Entered and Authorized by:   Neena Rhymes MD   Signed by:   Neena Rhymes MD on  01/20/2010   Method used:   Faxed to ...       [facility] (mail-order)             , [place]         Ph: [phone]       Fax: [phone]   RxID:   [id number]  Flu Vaccine Consent Questions     Do you have a history of severe allergic reactions to this vaccine? no    Any prior history of allergic reactions to egg and/or  gelatin? no    Do you have a sensitivity to the preservative Thimersol? no    Do you have a past history of Guillan-Barre Syndrome? no    Do you currently have an acute febrile illness? no    Have you ever had a severe reaction to latex? no    Vaccine information given and explained to patient? yes    Are you currently pregnant? no    Lot Number:[id number]   Exp Date:10/31/2009   Site Given  Right Deltoid IM      Ph: [phone]       Fax: [phone]   RxID:   [id number]  .lbflu

## 2010-06-05 NOTE — Letter (Signed)
Summary: [facility]  [facility]   Imported By: Lanelle Bal 07/18/2009 10:43:39  _____________________________________________________________________  External Attachment:    Type:   Image     Comment:   External Document

## 2010-06-05 NOTE — Procedures (Signed)
Summary: EGD   EGD  Procedure date:  04/26/2002  Findings:      GERD Location: [facility]    EGD  Procedure date:  04/26/2002  Findings:      GERD Location: [facility]   Patient Name: Lacey [name], Lacey [name]. MRN: [id number] Procedure Procedures: Panendoscopy (EGD) CPT: 43235.    with esophageal dilation. CPT: G9296129.  Personnel: Endoscopist: Wilhemina Bonito. Marina Goodell, MD.  Referred By: Margrett Rud, MD.  Exam Location: Exam performed in Endoscopy Suite.  Patient Consent: Procedure, Alternatives, Risks and Benefits discussed, consent obtained,  Indications Symptoms: Dysphagia. Reflux symptoms  History  Pre-Exam Physical: Performed Apr 26, 2002  Entire physical exam was normal.  Exam Exam Info: Maximum depth of insertion Duodenum, intended Duodenum. Patient position: on left side. Vocal cords visualized. Gastric retroflexion performed. Images taken. ASA Classification: II. Tolerance: excellent.  Sedation Meds: Demerol 100 mg. given IV. Versed 9 mg. given IV.  Monitoring: BP and pulse monitoring done. Oximetry used. Supplemental O2 given  Fluoroscopy: Fluoroscopy was used.  Findings - Normal: Proximal Esophagus to Distal Esophagus. Comments: No Barrett's or obvious stricture.  - Dilation: Proximal Esophagus. Procedure was performed under Fluoroscopy. Wire Guided/Savary (Wilson-Cook) dilator used, Diameter: 18 mm, Minimal Resistance, No Heme present on extraction. 1  total dilators used. Patient tolerance excellent.  - PRIOR SURGERY: Cardia. Banded Gastroplasty. Comments: Mild resistance to passage of the endoscope through the pouch outlet.   Assessment Abnormal examination, see findings above.  Diagnoses: 530.81: GERD.   Events  Unplanned Intervention: No unplanned interventions were required.  Unplanned Events: There were no complications. Plans Instructions: Nothing to eat or drink for 2 hrs.  Clear or full liquids: 2 hrs. Resume previous  diet: 4 hrs.  Medication(s): Continue current medications.  Patient Education: Patient given standard instructions for: Stenosis / Stricture.  Disposition: After procedure patient sent to recovery. After recovery patient sent home.  Scheduling: Follow-up prn.  Comments: Patient for colonoscopy 04-28-02 This report was created from the original endoscopy report, which was reviewed and signed by the above listed endoscopist.    cc: Margrett Rud, MD

## 2010-06-05 NOTE — Miscellaneous (Signed)
  Clinical Lists Changes  Observations: Added new observation of DMEYEEXAMNXT: 12/2010 (11/21/2009 13:57) Added new observation of DIABET TYPE: Type 2 Diabetes Mellitus (11/21/2009 13:57) Added new observation of DMEYEEXMRES: normal (11/20/2009 13:59) Added new observation of EYE EXAM BY: Dr. Clarene Critchley (11/20/2009 13:59) Added new observation of DIAB EYE EX: normal (11/20/2009 13:59)        Diabetes Management Exam:    Eye Exam:       Eye Exam done elsewhere          Date: 11/20/2009          Results: normal          Done by: Dr. Clarene Critchley

## 2010-06-05 NOTE — Letter (Signed)
Summary: Colonoscopy Letter  [facility]  [address] [place], [place] [address]   Phone: [phone]  Fax: [phone]      March 06, 2010 MRN: [id number]   [name] [address] [place], [place]  [address]   Dear Ms. Sharene Skeans,   According to your medical record, it is time for you to schedule a Colonoscopy. The American Cancer Society recommends this procedure as a method to detect early colon cancer. Patients with a family history of colon cancer, or a personal history of colon polyps or inflammatory bowel disease are at increased risk.  This letter has been generated based on the recommendations made at the time of your procedure. If you feel that in your particular situation this may no longer apply, please contact our office.  Please call our office at [phone] to schedule this appointment or to update your records at your earliest convenience.  Thank you for cooperating with [place] to provide you with the very best care possible.   Sincerely,  Wilhemina Bonito. Marina Goodell, M.D.  [facility] [phone]

## 2010-06-05 NOTE — Letter (Signed)
Summary: Diabetic Instructions  [facility]  [address] [place], [place] [address]   Phone: [phone]  Fax: [phone]    Lacey [name] [birth year] MRN: [id number]   _ x _   ORAL DIABETIC MEDICATION INSTRUCTIONS  The day before your procedure:   Take your diabetic pill as you do normally  The day of your procedure:   Do not take your diabetic pill    We will check your blood sugar levels during the admission process and again in Recovery before discharging you home  ________________________________________________________________________  _ x _   INSULIN (LONG ACTING) MEDICATION INSTRUCTIONS (Lantus, NPH, 70/30, Humulin, Novolin-N)   The day before your procedure:   Take  your regular evening dose    The day of your procedure:   Do not take your morning dose    _x  _   INSULIN (SHORT ACTING) MEDICATION INSTRUCTIONS (Regular, Humulog, Novolog)   The day before your procedure:   Do not take your evening dose   The day of your procedure:   Do not take your morning dose

## 2010-06-05 NOTE — Letter (Signed)
Summary: [facility]  [facility]   Imported By: Lanelle Bal 04/10/2010 15:26:54  _____________________________________________________________________  External Attachment:    Type:   Image     Comment:   External Document

## 2010-06-05 NOTE — Assessment & Plan Note (Signed)
Summary: CPX/CDJ   Vital Signs:  Patient profile:   60 year old female Menstrual status:  perimenopausal Height:      62.5 inches Weight:      207.8 pounds Temp:     97.2 degrees F oral Pulse rate:   107 / minute BP sitting:   120 / 80  (left arm) Cuff size:   large  Vitals Entered By: Kathrynn Speed CMA (November 19, 2009 8:05 AM)  History of Present Illness: 60 yo woman here today for CPE.  pap done yesterday by Dr Nicholas Lose.  mammogram done last week.  eye exam tomorrow.  UTD on colonoscopy.  1) Tachycardia- improved from last visit since starting Dilt.  no dizziness.  no postural hypotension like w/ previous beta blocker.  2) Chronic cough- reports >1 month of sxs.  awakens at night w/ dry cough.  otherwise feels fine.  had URI 3-4 weeks ago but cough preceeded this.  3) DM- having labile CBGs.  some mornings fasting will be 90s and this AM was 188.  pt not following ADA diet and not exercising.  Preventive Screening-Counseling & Management  Alcohol-Tobacco     Smoking Status: current     Smoking Cessation Counseling: yes     Smoke Cessation Stage: contemplative     Packs/Day: 0.75  Caffeine-Diet-Exercise     Does Patient Exercise: no      Sexual History:  currently monogamous.        Drug Use:  never.    Problems Prior to Update: 1)  Tachycardia  (ICD-785.0) 2)  Physical Examination  (ICD-V70.0) 3)  Dysuria  (ICD-788.1) 4)  Thumb Pain, Right  (ICD-729.5) 5)  Chest Pain  (ICD-786.50) 6)  Anemia  (ICD-285.9) 7)  Hypertension, Benign Essential  (ICD-401.1) 8)  Hyperlipidemia  (ICD-272.4) 9)  Gerd  (ICD-530.81) 10)  Diabetes Mellitus, Type II  (ICD-250.00)  Current Medications (verified): 1)  Lisinopril-Hydrochlorothiazide 20-12.5 Mg Tabs (Lisinopril-Hydrochlorothiazide) .Marland Kitchen.. 1 Tablet By Mouth Daily 2)  Trilipix 135 Mg Cpdr (Choline Fenofibrate) .... Take One Tablet Daily 3)  Oxycodone-Acetaminophen 5-325 Mg Tabs (Oxycodone-Acetaminophen) .... Take 1-3 Tablet Every 6  Hours As Needed 4)  Lipitor 40 Mg Tabs (Atorvastatin Calcium) .... Take One Tablet At Bedtime 5)  Vitamin D (Ergocalciferol) 50000 Unit Caps (Ergocalciferol) .... Take One Tablet Every 2 Weeks 6)  Ferrous Sulfate 325 (65 Fe) Mg Tbec (Ferrous Sulfate) .... Take One Tablet Daily 7)  Trazodone Hcl 150 Mg Tabs (Trazodone Hcl) .... Take 1-2 At Bedtime As Needed 8)  Metformin Hcl 1000 Mg Tabs (Metformin Hcl) .... Take One Tablet Two Times A Day 9)  Calcium-Magnesium-Zinc 750-375-15 Mg Tabs (Calcium-Magnesium-Zinc) .... Take One Tablet Daily 10)  D-3 1000iu .... Take One Tablet Daily 11)  Hydrocortisone Valerate 0.2 % Crea (Hydrocortisone Valerate) .... Apply Sparingly Every 8 Hours As Needed 12)  Novolog Flexpen 100 Unit/ml Soln (Insulin Aspart) .... 12/13/08 Units Daily 13)  Betamethasone Dipropionate 0.05 % Crea (Betamethasone Dipropionate) .... Apply Every 12 Hrs To Affected Area 14)  Lantus Solostar 100 Unit/ml Soln (Insulin Glargine) .... Inject 52 Units Daily 15)  [facility] Joseph Aspirin 81 Mg Tbec (Aspirin) .... Take One Tablet Daily 16)  Flexeril 10 Mg Tabs (Cyclobenzaprine Hcl) .Marland Kitchen.. 1 By Mouth Three Times A Day 17)  Nitrofurantoin Monohyd Macro 100 Mg Caps (Nitrofurantoin Monohyd Macro) .... Take One Tablet At Bedtime 18)  Nucynta 50 Mg Tabs (Tapentadol Hcl) .... Take One Tablet Three Times A Day As Needed For Pain 19)  Cartia  Xt 120 Mg Xr24h-Cap (Diltiazem Hcl Coated Beads) .Marland Kitchen.. 1 Tab By Mouth Daily 20)  Onetouch Ultra Test  Strp (Glucose Blood) .... Test Four Times Daily 21)  Onetouch Delica Lancets  Misc (Lancets) .... Test Four Times Daily 22)  Bd Pen Needle Short U/f 31g X 8 Mm Misc (Insulin Pen Needle) .... Use Four Times Daily  Allergies (verified): No Known Drug Allergies  Past History:  Past Medical History: Last updated: 01/08/2009 Diabetes mellitus, type II Anemia Hyperlipidemia HTN GERD  Past Surgical History: Last updated: 11/01/2009 Hysterectomy-ovaries remain R arm  radial tunnel release Cholecystectomy Disc replacement C4-C6 hand surgery 5/11  Social History: Last updated: 01/08/2009 married works in office at [employer] 2 children- local 2 step children  Family History: colon cancer- no breast cancer- no MI- father  Social History: Smoking Status:  current Packs/Day:  0.75 Does Patient Exercise:  no  Review of Systems       The patient complains of prolonged cough.  The patient denies anorexia, fever, weight loss, weight gain, vision loss, decreased hearing, hoarseness, chest pain, syncope, dyspnea on exertion, peripheral edema, headaches, abdominal pain, melena, hematochezia, severe indigestion/heartburn, hematuria, suspicious skin lesions, depression, abnormal bleeding, enlarged lymph nodes, and breast masses.    Physical Exam  General:  Well-developed,well-nourished,in no acute distress; alert,appropriate and cooperative throughout examination Head:  NCAT Eyes:  No corneal or conjunctival inflammation noted. EOMI. Perrla. Funduscopic exam benign, without hemorrhages, exudates or papilledema. Vision grossly normal. Ears:  External ear exam shows no significant lesions or deformities.  Otoscopic examination reveals clear canals, tympanic membranes are intact bilaterally without bulging, retraction, inflammation or discharge. Hearing is grossly normal bilaterally. Nose:  External nasal examination shows no deformity or inflammation. Nasal mucosa are pink and moist without lesions or exudates. Mouth:  Oral mucosa and oropharynx without lesions or exudates.  Teeth in good repair. Neck:  No deformities, masses, or tenderness noted. Breasts:  deferred to gyn Lungs:  Normal respiratory effort, chest expands symmetrically. Lungs are clear to auscultation, no crackles or wheezes. Heart:  Normal rate and regular rhythm. S1 and S2 normal without gallop, murmur, click, rub or other extra sounds. Abdomen:  Bowel sounds positive,abdomen soft  and non-tender without masses, organomegaly or hernias noted. Genitalia:  deferred to gyn Msk:  No deformity or scoliosis noted of thoracic or lumbar spine.   Pulses:  +2 carotid, radial, DP Extremities:  No clubbing, cyanosis, edema, or deformity noted with normal full range of motion of all joints.   Neurologic:  No cranial nerve deficits noted. Station and gait are normal. Plantar reflexes are down-going bilaterally. DTRs are symmetrical throughout. Sensory, motor and coordinative functions appear intact. Skin:  Intact without suspicious lesions or rashes Cervical Nodes:  No lymphadenopathy noted Psych:  Cognition and judgment appear intact. Alert and cooperative with normal attention span and concentration. No apparent delusions, illusions, hallucinations   Impression & Recommendations:  Problem # 1:  PHYSICAL EXAMINATION (ICD-V70.0) Assessment Unchanged pt's PE WNL.  labs from last visit reviewed.  UTD on health maintainence.  anticipatory guidance provided.  Problem # 2:  TACHYCARDIA (ICD-785.0) Assessment: Improved pt's pulse now hovering around 100 which per her report is much more comfortable than 140s.  will not increase Dilt due to fear of hypotension.  Problem # 3:  DIABETES MELLITUS, TYPE II (ICD-250.00) Assessment: Unchanged reviewed self adjustment of Lantus dose based on fasting CBGs.  stressed the importance of ADA diet and regular exercise.  will follow closely Her  updated medication list for this problem includes:    Diovan Hct 160-12.5 Mg Tabs (Valsartan-hydrochlorothiazide) .Marland Kitchen... 1 tab by mouth daily    Metformin Hcl 1000 Mg Tabs (Metformin hcl) .Marland Kitchen... Take one tablet two times a day    Novolog Flexpen 100 Unit/ml Soln (Insulin aspart) .Marland Kitchen... 12/13/08 units daily    Lantus Solostar 100 Unit/ml Soln (Insulin glargine) ..... Inject 52 units daily    [facility] Joseph Aspirin 81 Mg Tbec (Aspirin) .Marland Kitchen... Take one tablet daily  Problem # 4:  COUGH (ICD-786.2) Assessment:  New likely due to ACE.  switch to ARB and follow.  Complete Medication List: 1)  Diovan Hct 160-12.5 Mg Tabs (Valsartan-hydrochlorothiazide) .Marland Kitchen.. 1 tab by mouth daily 2)  Trilipix 135 Mg Cpdr (Choline fenofibrate) .... Take one tablet daily 3)  Oxycodone-acetaminophen 5-325 Mg Tabs (Oxycodone-acetaminophen) .... Take 1-3 tablet every 6 hours as needed 4)  Lipitor 40 Mg Tabs (Atorvastatin calcium) .... Take one tablet at bedtime 5)  Vitamin D (ergocalciferol) 50000 Unit Caps (Ergocalciferol) .... Take one tablet every 2 weeks 6)  Ferrous Sulfate 325 (65 Fe) Mg Tbec (Ferrous sulfate) .... Take one tablet daily 7)  Trazodone Hcl 150 Mg Tabs (Trazodone hcl) .... Take 1-2 at bedtime as needed 8)  Metformin Hcl 1000 Mg Tabs (Metformin hcl) .... Take one tablet two times a day 9)  Calcium-magnesium-zinc 750-375-15 Mg Tabs (Calcium-magnesium-zinc) .... Take one tablet daily 10)  D-3 1000iu  .... Take one tablet daily 11)  Hydrocortisone Valerate 0.2 % Crea (Hydrocortisone valerate) .... Apply sparingly every 8 hours as needed 12)  Novolog Flexpen 100 Unit/ml Soln (Insulin aspart) .... 12/13/08 units daily 13)  Betamethasone Dipropionate 0.05 % Crea (Betamethasone dipropionate) .... Apply every 12 hrs to affected area 14)  Lantus Solostar 100 Unit/ml Soln (Insulin glargine) .... Inject 52 units daily 15)  [facility] Aspirin 81 Mg Tbec (Aspirin) .... Take one tablet daily 16)  Flexeril 10 Mg Tabs (Cyclobenzaprine hcl) .Marland Kitchen.. 1 by mouth three times a day 17)  Nitrofurantoin Monohyd Macro 100 Mg Caps (Nitrofurantoin monohyd macro) .... Take one tablet at bedtime 18)  Nucynta 50 Mg Tabs (Tapentadol hcl) .... Take one tablet three times a day as needed for pain 19)  Cartia Xt 120 Mg Xr24h-cap (Diltiazem hcl coated beads) .Marland Kitchen.. 1 tab by mouth daily 20)  Onetouch Ultra Test Strp (Glucose blood) .... Test four times daily 21)  Onetouch Delica Lancets Misc (Lancets) .... Test four times daily 22)  Bd Pen Needle  Short U/f 31g X 8 Mm Misc (Insulin pen needle) .... Use four times daily  Patient Instructions: 1)  Follow up in 2 months to recheck Diabetes 2)  STOP the lisinopril HCT- START the Diovan HCT daily 3)  If your fasting sugar is consistently >140 then increase your Lantus by 2 units every 2 days 4)  If your sugar is low before a meal, cut your mealtime insulin in half 5)  STOP smoking! 6)  Call with any questions or concerns 7)  Hang in there! Prescriptions: DIOVAN HCT 160-12.5 MG TABS (VALSARTAN-HYDROCHLOROTHIAZIDE) 1 tab by mouth daily  #90 x 3   Entered and Authorized by:   Neena Rhymes MD   Signed by:   Neena Rhymes MD on 11/19/2009   Method used:   Print then Give to Patient   RxID:   [id number]    Preventive Care Screening  Mammogram:    Date:  11/15/2009    Results:  normal

## 2010-06-05 NOTE — Op Note (Signed)
Summary: Operative Report                    [facility]  Patient:    PEGGI, YONO                        MRN: [id number] Proc. Date: 12/29/99 Adm. Date:  [id number] Attending:  Tempie Donning CC:         Vale Haven. Andrey Campanile, M.D.                           Operative Report  PREOPERATIVE DIAGNOSIS:  Cholecystitis.  POSTOPERATIVE DIAGNOSIS:  Cholecystitis.  OPERATION PERFORMED:  Laparoscopic cholecystectomy..  SURGEON:  Gita Kudo, M.D.  ASSISTANT:  Donnie Coffin. Samuella Cota, M.D.  ANESTHESIA:  General endotracheal.  INDICATIONS FOR PROCEDURE:  The patient is a 60 year old female with bouts of acute right upper quadrant abdominal pain.  Recent evaluation in the emergency room showed gallstones.  Of note is the fact that she has had open bariatric surgery with a stomach stapling a few years ago in [place].  OPERATIVE FINDINGS:  There were multiple adhesions but we were able to maneuver through them without having to open the patient.  The gallbladder had a few filmy adhesions on it and was not acutely inflamed.  The cystic duct was quite small and although I attempted to cannulate it, I was unsuccessful to perform cholangiogram.  DESCRIPTION OF PROCEDURE:  Under satisfactory general endotracheal anesthesia, having received 1.0 gm Ancef, the patients abdomen was prepped and draped in a standard fashion.  A transverse incision was made beneath the umbilicus and the midline opened carefully into the peritoneum.  Finger dissection was used to develop a plane and then the camera was inserted to show that we were in good position and CO2 insufflation performed.  Under direct vision, another #10 port was placed medially and two #5 ports laterally.  Operating through the upper medial port, I carefully took out adhesions.  Graspers in the lateral port gave excellent exposure. The cystic duct and cystic artery were each identified carefully and circumferentially  dissected with a right ankle clamp.  When certain of the anatomy, the cystic artery was divided between multiple metal clips.  The cystic duct was clamped close to the gallbladder and an incision made in it.  Then a cholangiogram catheter was let in a separate stab wound and attempts at cannulating the duct were unsuccessful because of the small size of the duct.  The catheter was then withdrawn and the duct controlled with three metal staples and then transected.  Then the gallbladder was removed from below upwards using the coagulating spatula for both hemostasis and dissection. Before severing the gallbladder, the liver bed was checked for hemostasis which was good.  Because of a small pinpoint hole in the gallbladder and a little bit of bile spillage, an Endo catch bag was placed through the upper port and then the gallbladder removed without complication through the umbilical port after the camera was moved.  Then the entire abdomen was lavaged with saline and suctioned dry.  The operative site was hemostatic.  Following this, all CO2 and port was released under direct vision and then the midline closed with previous suture and a second interrupted 0 Vicryl suture.  All sites had been infiltrated with Marcaine and then the subcutaneous tissues approximated  with 4-0 Vicryl and skin with Steri-Strips.  Sterile absorbent dressings were then applied.  The patient went to the recovery room from the operating room in good condition without complication. DD:  12/29/99 TD:  12/29/99 Job: [id number]

## 2010-06-05 NOTE — Progress Notes (Signed)
[facility]   Imported By: Sherian Rein 05/02/2010 07:29:17  _____________________________________________________________________  External Attachment:    Type:   Image     Comment:   External Document

## 2010-06-05 NOTE — Letter (Signed)
Summary: [facility]  [facility]   Imported By: Lanelle Bal 09/10/2009 12:21:07  _____________________________________________________________________  External Attachment:    Type:   Image     Comment:   External Document

## 2010-06-05 NOTE — Progress Notes (Signed)
Summary: labs results  Phone Note Outgoing Call   Call placed by: [facility] CMA,  June 03, 2009 2:31 PM Details for Reason: lab results Summary of Call: + UTI--- treated with Cipro Initial call taken by: Jeremy Johann CMA,  June 03, 2009 2:31 PM  Follow-up for Phone Call        pt aware....................Marland KitchenFelecia Deloach CMA  June 03, 2009 4:49 PM

## 2010-06-05 NOTE — Progress Notes (Signed)
Summary: REFILL  Phone Note Refill Request Call back at [phone] Message from:  Pharmacy on May 27, 2010 8:30 AM  Refills Requested: Medication #1:  SIMVASTATIN 40 MG TABLET-TAKE 1 TABLET EVERY DAY   Dosage confirmed as above?Dosage Confirmed   Supply Requested: 3 months   Last Refilled: 05/26/2010 PRIOR AUTH: [id number]   Initial call taken by: Lavell Islam,  May 27, 2010 8:32 AM    Prescriptions: LIPITOR 20 MG TABS (ATORVASTATIN CALCIUM) take one tablet at bedtime  #90 Tablet x 0   Entered by:   Jeremy Johann CMA   Authorized by:   Neena Rhymes MD   Signed by:   Jeremy Johann CMA on 05/27/2010   Method used:   Re-Faxed to ...       [facility]  [address] [phone]* (retail)       [address]       [place], [place]  [id number]       Ph: [phone] or [phone]       Fax: [phone]   RxID:   [id number]

## 2010-06-05 NOTE — Letter (Signed)
Summary: [facility]  [facility]   Imported By: Lanelle Bal 08/09/2009 13:32:59  _____________________________________________________________________  External Attachment:    Type:   Image     Comment:   External Document

## 2010-06-05 NOTE — Letter (Signed)
Summary: [facility] Instructions  [facility]  [address] [place], [place] [address]   Phone: [phone]  Fax: [phone]       JESSIE COWHER    60/27/1952    MRN: [id number]        Procedure Day /Date:_Wednesday February 8th, 2012     Arrival Time: 7:30am     Procedure Time: 8:00am     Location of Procedure:                    _x _  [facility] (4th Floor)                        PREPARATION FOR COLONOSCOPY WITH MOVIPREP   Starting 5 days prior to your procedure 06/06/10 do not eat nuts, seeds, popcorn, corn, beans, peas,  salads, or any raw vegetables.  Do not take any fiber supplements (e.g. Metamucil, Citrucel, and Benefiber).  THE DAY BEFORE YOUR PROCEDURE         DATE: 06/10/10  DAY: Tuesday  1.  Drink clear liquids the entire day-NO SOLID FOOD  2.  Do not drink anything colored red or purple.  Avoid juices with pulp.  No orange juice.  3.  Drink at least 64 oz. (8 glasses) of fluid/clear liquids during the day to prevent dehydration and help the prep work efficiently.  CLEAR LIQUIDS INCLUDE: Water Jello Ice Popsicles Tea (sugar ok, no milk/cream) Powdered fruit flavored drinks Coffee (sugar ok, no milk/cream) Gatorade Juice: apple, white grape, white cranberry  Lemonade Clear bullion, consomm, broth Carbonated beverages (any kind) Strained chicken noodle soup Hard Candy                             4.  In the morning, mix first dose of MoviPrep solution:    Empty 1 Pouch A and 1 Pouch B into the disposable container    Add lukewarm drinking water to the top line of the container. Mix to dissolve    Refrigerate (mixed solution should be used within 24 hrs)  5.  Begin drinking the prep at 5:00 p.m. The MoviPrep container is divided by 4 marks.   Every 15 minutes drink the solution down to the next mark (approximately 8 oz) until the full liter is complete.   6.  Follow completed prep with 16 oz of clear liquid of your choice  (Nothing red or purple).  Continue to drink clear liquids until bedtime.  7.  Before going to bed, mix second dose of MoviPrep solution:    Empty 1 Pouch A and 1 Pouch B into the disposable container    Add lukewarm drinking water to the top line of the container. Mix to dissolve    Refrigerate  THE DAY OF YOUR PROCEDURE      DATE: 06/11/10 DAY: Wednesday  Beginning at 3:00 a.m. (5 hours before procedure):         1. Every 15 minutes, drink the solution down to the next mark (approx 8 oz) until the full liter is complete.  2. Follow completed prep with 16 oz. of clear liquid of your choice.    3. You may drink clear liquids until 6:00AM (2 HOURS BEFORE PROCEDURE).   MEDICATION INSTRUCTIONS  Unless otherwise instructed, you should take regular prescription medications with a small sip of water   as early as possible the morning of your procedure.  Diabetic patients - see separate instructions.          OTHER INSTRUCTIONS  You will need a responsible adult at least 60 years of age to accompany you and drive you home.   This person must remain in the waiting room during your procedure.  Wear loose fitting clothing that is easily removed.  Leave jewelry and other valuables at home.  However, you may wish to bring a book to read or  an iPod/MP3 player to listen to music as you wait for your procedure to start.  Remove all body piercing jewelry and leave at home.  Total time from sign-in until discharge is approximately 2-3 hours.  You should go home directly after your procedure and rest.  You can resume normal activities the  day after your procedure.  The day of your procedure you should not:   Drive   Make legal decisions   Operate machinery   Drink alcohol   Return to work  You will receive specific instructions about eating, activities and medications before you leave.    The above instructions have been reviewed and explained to me by    _______________________    I fully understand and can verbalize these instructions _____________________________ Date _________

## 2010-06-05 NOTE — Letter (Signed)
Summary: New Patient letter  [facility]  [address] [place], [place] [address]   Phone: [phone]  Fax: [phone]       03/19/2010 MRN: [id number]  ROBYN GALATI [address] [place], [place]  [address]  Dear Ms. Sharene Skeans,  Welcome to the Gastroenterology Division at [facility].    You are scheduled to see Dr.  Marina Goodell on 05/01/10 at 11:00 A.M. on the 3rd floor at [facility], [address]  We ask that you try to arrive at our office 15 minutes prior to your appointment time to allow for check-in.  We would like you to complete the enclosed self-administered evaluation form prior to your visit and bring it with you on the day of your appointment.  We will review it with you.  Also, please bring a complete list of all your medications or, if you prefer, bring the medication bottles and we will list them.  Please bring your insurance card so that we may make a copy of it.  If your insurance requires a referral to see a specialist, please bring your referral form from your primary care physician.  Co-payments are due at the time of your visit and may be paid by cash, check or credit card.     Your office visit will consist of a consult with your physician (includes a physical exam), any laboratory testing he/she may order, scheduling of any necessary diagnostic testing (e.g. x-ray, ultrasound, CT-scan), and scheduling of a procedure (e.g. Endoscopy, Colonoscopy) if required.  Please allow enough time on your schedule to allow for any/all of these possibilities.    If you cannot keep your appointment, please call [phone] to cancel or reschedule prior to your appointment date.  This allows [place] the opportunity to schedule an appointment for another patient in need of care.  If you do not cancel or reschedule by 5 p.m. the business day prior to your appointment date, you will be charged a $50.00 late cancellation/no-show fee.    Thank you for choosing [facility] for your medical needs.  We appreciate the opportunity to care for you.  Please visit [place] at our website  to learn more about our practice.                     Sincerely,                                                             The Gastroenterology Division

## 2010-06-05 NOTE — Medication Information (Signed)
Summary: Interaction with Sim[facility]atin [facility] Diltiazem/[facility]  [facility] with [facility]   Imported By: Lanelle Bal 02/12/2010 11:21:38  _____________________________________________________________________  External Attachment:    Type:   Image     Comment:   External Document

## 2010-06-05 NOTE — Assessment & Plan Note (Signed)
Summary: np6   Primary Provider:  Beverely Low   History of Present Illness: Lacey [name] is 60 years old and is referred today for evaluation of palpitations. She has a long history of fast heartbeat and palpitations for the past 2-3 years. She was evaluated many years ago by Dr. Donnie Aho with a catheterization which she said was okay. In addition to the palpitations she has had chest discomfort which usually comes with the  palpitations. This could occur at rest or with exertion.  She does have a fairly high-risk profile for vascular disease. She has diabetes, hypertension, and hyperlipidemia. Also her father died at 24 after having multiple heart attacks.  She was seen today in Dr. Rennis Golden office with a tachycardia and her ECG showed sinus tachycardia at 107. Her rate was somewhat faster at 120 in the office. She says when she has this at home and takes her blood pressure her heart rate has been as high as 140.  Current Medications (verified): 1)  Diovan Hct 160-12.5 Mg Tabs (Valsartan-Hydrochlorothiazide) .Marland Kitchen.. 1 Tab By Mouth Daily 2)  Trilipix 135 Mg Cpdr (Choline Fenofibrate) .... Take One Tablet Daily 3)  Oxycodone-Acetaminophen 5-325 Mg Tabs (Oxycodone-Acetaminophen) .... Take 1-3 Tablet Every 6 Hours As Needed 4)  Trazodone Hcl 150 Mg Tabs (Trazodone Hcl) .... Take 1-2 At Bedtime As Needed 5)  Metformin Hcl 1000 Mg Tabs (Metformin Hcl) .... Take One Tablet Two Times A Day 6)  D-3 1000iu .... Take One Tablet Daily 7)  Hydrocortisone Valerate 0.2 % Crea (Hydrocortisone Valerate) .... Apply Sparingly Every 8 Hours As Needed 8)  Novolog Flexpen 100 Unit/ml Soln (Insulin Aspart) .... 12/13/08 Units Daily 9)  Betamethasone Dipropionate 0.05 % Crea (Betamethasone Dipropionate) .... Apply Every 12 Hrs To Affected Area 10)  Lantus Solostar 100 Unit/ml Soln (Insulin Glargine) .... Inject 60 Units Daily 11)  [facility] Aspirin 81 Mg Tbec (Aspirin) .... Take One Tablet Daily 12)  Nitrofurantoin Monohyd  Macro 100 Mg Caps (Nitrofurantoin Monohyd Macro) .... Take One Tablet At Bedtime 13)  Cartia Xt 120 Mg Xr24h-Cap (Diltiazem Hcl Coated Beads) .Marland Kitchen.. 1 Tab By Mouth Daily 14)  Onetouch Ultra Test  Strp (Glucose Blood) .... Test Four Times Daily 15)  Onetouch Delica Lancets  Misc (Lancets) .... Test Four Times Daily 16)  Bd Pen Needle Short U/f 31g X 8 Mm Misc (Insulin Pen Needle) .... Use Four Times Daily 17)  Klonopin 1 Mg Tabs (Clonazepam) .Marland Kitchen.. 1 Tab Nightly For Restless Leg 18)  Lipitor 20 Mg Tabs (Atorvastatin Calcium) .... Take One Tablet At Bedtime  Allergies (verified): No Known Drug Allergies  Past History:  Past Medical History: Reviewed history from 01/20/2010 and no changes required. Diabetes mellitus, type II Anemia Hyperlipidemia HTN GERD Vit D deficiency Restless leg  Family History: Reviewed history from 11/19/2009 and no changes required. colon cancer- no breast cancer- no MI- father  Social History: Reviewed history from 01/08/2009 and no changes required. 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   Vital Signs:  Patient profile:   60 year old female Menstrual status:  perimenopausal Height:      62.5 inches Weight:      213 pounds BMI:     38.48 Pulse rate:   116 / minute BP sitting:   140 / 70  (left arm)  Vitals Entered By: Laurance Flatten CMA (April 24, 2010 10:49 AM)  Physical Exam  Additional Exam:  Gen. Well-nourished, in no distress   Neck: No JVD, thyroid not enlarged, no carotid bruits Lungs: No tachypnea, clear without rales, rhonchi or wheezes Cardiovascular: Rhythm regular, PMI not displaced,  heart sounds  normal, no murmurs or gallops, no peripheral edema, pulses normal in all 4 extremities. Abdomen: BS normal, abdomen soft and non-tender without masses or organomegaly, no hepatosplenomegaly. MS: No deformities, no cyanosis  or clubbing   Neuro:  No focal sns   Skin:  no lesions    Impression & Recommendations:  Problem # 1:  TACHYCARDIA (ICD-785.0) She has tachycardia and palpitations.  Etiol not clear.  Will get Hbg, TSH and Holter monitor.  Problem # 2:  CHEST PAIN (ICD-786.50) The CP is mostly atypical for ischemia but there are some typical features. She has strong risk factor profile.  We will plan eval with MV.  We will also get 2D echo. Her updated medication list for this problem includes:    [facility] Aspirin 81 Mg Tbec (Aspirin) .Marland Kitchen... Take one tablet daily    Cartia Xt 120 Mg Xr24h-cap (Diltiazem hcl coated beads) .Marland Kitchen... 1 tab by mouth daily  Problem # 3:  HYPERTENSION, BENIGN ESSENTIAL (ICD-401.1) This is well controlled on current Rx. Her updated medication list for this problem includes:    Diovan Hct 160-12.5 Mg Tabs (Valsartan-hydrochlorothiazide) .Marland Kitchen... 1 tab by mouth daily    [facility] Aspirin 81 Mg Tbec (Aspirin) .Marland Kitchen... Take one tablet daily    Cartia Xt 120 Mg Xr24h-cap (Diltiazem hcl coated beads) .Marland Kitchen... 1 tab by mouth daily  Other Orders: Nuclear Stress Test (Nuc Stress Test) Holter (Holter) Echocardiogram (Echo) TLB-BMP (Basic Metabolic Panel-BMET) (80048-METABOL) TLB-A1C / Hgb A1C (Glycohemoglobin) (83036-A1C) TLB-Hepatic/Liver Function Pnl (80076-HEPATIC) TLB-Lipid Panel (80061-LIPID) TLB-TSH (Thyroid Stimulating Hormone) (84443-TSH) TLB-CBC Platelet - w/Differential (85025-CBCD)  Patient Instructions: 1)  Your physician has requested that you have an echocardiogram.  Echocardiography is a painless test that uses sound waves to create images of your heart. It provides your doctor with information about the size and shape of your heart and how well your heart's chambers and valves are working.  This procedure takes approximately one hour. There are no restrictions for this procedure. 2)  Your physician has recommended that you wear a 48 hour holter monitor.  Holter monitors are  medical devices that record the heart's electrical activity. Doctors most often use these monitors to diagnose arrhythmias. Arrhythmias are problems with the speed or rhythm of the heartbeat. The monitor is a small, portable device. You can wear one while you do your normal daily activities. This is usually used to diagnose what is causing palpitations/syncope (passing out). 3)  Your physician has requested that you have an exercise stress myoview.  For further information please visit [email or web address]  Please follow instruction sheet, as given. 4)  Labwork today: A1C/ bmet/lipid/liver/tsh- as ordered by Dr. Beverely Low.

## 2010-06-05 NOTE — Progress Notes (Signed)
Summary: cipro refill denied   Phone Note Refill Request Message from:  Fax from Pharmacy on [address] fax [phone]  ciprofloxacin 50 mg  Initial call taken by: Barb Merino,  June 11, 2009 10:07 AM  Follow-up for Phone Call        patient needs to contact office for appt.......Marland KitchenDoristine Devoid  June 11, 2009 2:53 PM

## 2010-06-05 NOTE — Consult Note (Signed)
Summary: [facility]   Imported By: Sherian Rein 05/02/2010 07:30:26  _____________________________________________________________________  External Attachment:    Type:   Image     Comment:   External Document

## 2010-06-05 NOTE — Progress Notes (Signed)
Summary: NO prior auth CHANGE TO LIPITOR  Phone Note Refill Request Message from:  Fax from Pharmacy on May 28, 2010 9:09 AM  Refills Requested: Medication #1:  SIMVASTATIN 40 MG TAKE ONE Lacey [name] PRIOR AUTH    [id number]  -IID [id number]  --  Initial call taken by: Okey Regal [place],  May 28, 2010 9:14 AM  Follow-up for Phone Call        Pt no longer taking med. Med changed to lipitor 02-24-10, Pharmacy faxed .............Marland KitchenFelecia Deloach CMA  May 29, 2010 8:04 AM

## 2010-06-05 NOTE — Progress Notes (Signed)
Summary: monitor results  Phone Note Outgoing Call   Call placed by: Katina Dung, RN, BSN,  May 07, 2010 3:14 PM Call placed to: Patient Summary of Call: monitor results  Follow-up for Phone Call        Dr Shirlee Latch reviewed monitor done 04/30/10--frequent runs SVT--some may be AVNRT, also shows sinus tachycardia-?some atrial tachycardia --add Toprol XL 25mg  two times a day and EP evaluation --I  discussed with pt and she verbalized understanding--appt with Dr Shirlee Latch 07/14/10   New Problems: SUPRAVENTRICULAR TACHYCARDIA (ICD-427.89)   New Problems: SUPRAVENTRICULAR TACHYCARDIA (ICD-427.89) New/Updated Medications: TOPROL XL 25 MG XR24H-TAB (METOPROLOL SUCCINATE) one twice a day Prescriptions: TOPROL XL 25 MG XR24H-TAB (METOPROLOL SUCCINATE) one twice a day  #60 x 6   Entered by:   Katina Dung, RN, BSN   Authorized by:   Marca Ancona, MD   Signed by:   Katina Dung, RN, BSN on 05/07/2010   Method used:   [facility]        [phone]       [address]       [place], [place]  [address]       Ph: [phone] or [phone]       Fax: [phone]   RxID:   [id number]    Current Medications (verified): 1)  Diovan Hct 160-12.5 Mg Tabs (Valsartan-Hydrochlorothiazide) .Marland Kitchen.. 1 Tab By Mouth Daily 2)  Trilipix 135 Mg Cpdr (Choline Fenofibrate) .... Take One Tablet Daily 3)  Oxycodone-Acetaminophen 5-325 Mg Tabs (Oxycodone-Acetaminophen) .... Take 1-3 Tablet Every 6 Hours As Needed 4)  Trazodone Hcl 150 Mg Tabs (Trazodone Hcl) .... Take 1-2 At Bedtime As Needed 5)  Metformin Hcl 1000 Mg Tabs (Metformin Hcl) .... Take One Tablet Two Times A Day 6)  D-3 1000iu .... Take One Tablet Daily 7)  Hydrocortisone Valerate 0.2 % Crea (Hydrocortisone Valerate) .... Apply Sparingly Every 8 Hours As Needed 8)  Novolog Flexpen 100 Unit/ml Soln (Insulin Aspart) .... 12/13/08 Units Daily 9)  Betamethasone Dipropionate 0.05 %  Crea (Betamethasone Dipropionate) .... Apply Every 12 Hrs To Affected Area 10)  Lantus Solostar 100 Unit/ml Soln (Insulin Glargine) .... Inject 60 Units Daily 11)  [facility] Aspirin 81 Mg Tbec (Aspirin) .... Take One Tablet Daily 12)  Nitrofurantoin Monohyd Macro 100 Mg Caps (Nitrofurantoin Monohyd Macro) .... Take One Tablet At Bedtime 13)  Cartia Xt 120 Mg Xr24h-Cap (Diltiazem Hcl Coated Beads) .Marland Kitchen.. 1 Tab By Mouth Daily 14)  Onetouch Ultra Test  Strp (Glucose Blood) .... Test Four Times Daily 15)  Onetouch Delica Lancets  Misc (Lancets) .... Test Four Times Daily 16)  Bd Pen Needle Short U/f 31g X 8 Mm Misc (Insulin Pen Needle) .... Use Four Times Daily 17)  Klonopin 1 Mg Tabs (Clonazepam) .Marland Kitchen.. 1 Tab Nightly For Restless Leg 18)  Lipitor 20 Mg Tabs (Atorvastatin Calcium) .... Take One Tablet At Bedtime 19)  Moviprep 100 Gm  Solr (Peg-Kcl-Nacl-Nasulf-Na Asc-C) .... As Per Prep Instructions. 20)  Toprol Xl 25 Mg Xr24h-Tab (Metoprolol Succinate) .... One Twice A Day  Allergies: No Known Drug Allergies

## 2010-06-05 NOTE — Letter (Signed)
Summary: Diabetes Eval / [facility]  Diabetes Eval / [facility]   Imported By: Lennie Odor 11/28/2009 12:34:39  _____________________________________________________________________  External Attachment:    Type:   Image     Comment:   External Document

## 2010-06-05 NOTE — Letter (Signed)
Summary: [facility]  [facility]   Imported By: Lanelle Bal 06/20/2009 13:34:34  _____________________________________________________________________  External Attachment:    Type:   Image     Comment:   External Document

## 2010-06-05 NOTE — Progress Notes (Signed)
Summary: Clonazepam refill  Phone Note Refill Request Message from:  Fax from Pharmacy on May 02, 2010 9:25 AM  Refills Requested: Medication #1:  KLONOPIN 1 MG TABS 1 tab nightly for restless leg   Last Ref[facility]: [id number] [address] [place], [place], [place]   phone [phone]    fax - [phone]    qty = 30  Next Appointment Scheduled: none Initial call taken by: Jerolyn Shin,  May 02, 2010 9:26 AM    Prescriptions: KLONOPIN 1 MG TABS (CLONAZEPAM) 1 tab nightly for restless leg  #30 x 0   Entered by:   Doristine Devoid CMA   Authorized by:   Neena Rhymes MD   Signed by:   Doristine Devoid CMA on 05/02/2010   Method used:   Telephoned to ...       [facility]  [address] [phone]* (retail)       [address]       [place], [place]  [id number]       Ph: [phone] or [phone]       Fax: [phone]   RxID:   [id number]

## 2010-06-05 NOTE — Procedures (Signed)
Summary: summary report  summary report   Imported By: Mirna Mires 05/08/2010 10:31:20  _____________________________________________________________________  External Attachment:    Type:   Image     Comment:   External Document

## 2010-06-05 NOTE — Assessment & Plan Note (Signed)
Summary: 3 MONTHS --REG DIABETES CHECK///[facility]   Vital Signs:  Patient profile:   60 year old female Menstrual status:  perimenopausal Weight:      219 pounds BMI:     39.56 Pulse rate:   140 / minute BP sitting:   116 / 64  (left arm)  Vitals Entered By: Doristine Devoid CMA (April 24, 2010 9:15 AM) CC: dm f/u and labs   History of Present Illness: 60 yo woman here today for f/u on  1) DM- continues to gain weight.  on Metformin, Lantus and Novolog.  has upped Novolog to 15 units three times a day.  fasting CBG 188 today.  reports #s have been high.  UTD on eye exam.  no symptomatic lows.  2) HTN- excellent control.  on Cartia and Diovan.  + CP, SOB, HAs, edema.   3) Hyperlipidemia- on lipitor.  has gained weight since last visit.  4) Tachycardia- will have palpitations intermittantly.  was watching TV the other night and felt heart racing- took pulse it was 128.  initially 140 in office today, decreased to 95 but will fluctuate between 95 and 130.  still having chest pain- Dr Donnie Aho cards was not concerned.  'sometimes i'm scared to sleep b/c it hurts too bad'.  associated fatigue.  Current Medications (verified): 1)  Diovan Hct 160-12.5 Mg Tabs (Valsartan-Hydrochlorothiazide) .Marland Kitchen.. 1 Tab By Mouth Daily 2)  Trilipix 135 Mg Cpdr (Choline Fenofibrate) .... Take One Tablet Daily 3)  Oxycodone-Acetaminophen 5-325 Mg Tabs (Oxycodone-Acetaminophen) .... Take 1-3 Tablet Every 6 Hours As Needed 4)  Trazodone Hcl 150 Mg Tabs (Trazodone Hcl) .... Take 1-2 At Bedtime As Needed 5)  Metformin Hcl 1000 Mg Tabs (Metformin Hcl) .... Take One Tablet Two Times A Day 6)  D-3 1000iu .... Take One Tablet Daily 7)  Hydrocortisone Valerate 0.2 % Crea (Hydrocortisone Valerate) .... Apply Sparingly Every 8 Hours As Needed 8)  Novolog Flexpen 100 Unit/ml Soln (Insulin Aspart) .... 12/13/08 Units Daily 9)  Betamethasone Dipropionate 0.05 % Crea (Betamethasone Dipropionate) .... Apply Every 12 Hrs To Affected  Area 10)  Lantus Solostar 100 Unit/ml Soln (Insulin Glargine) .... Inject 60 Units Daily 11)  [facility] Aspirin 81 Mg Tbec (Aspirin) .... Take One Tablet Daily 12)  Nitrofurantoin Monohyd Macro 100 Mg Caps (Nitrofurantoin Monohyd Macro) .... Take One Tablet At Bedtime 13)  Cartia Xt 120 Mg Xr24h-Cap (Diltiazem Hcl Coated Beads) .Marland Kitchen.. 1 Tab By Mouth Daily 14)  Onetouch Ultra Test  Strp (Glucose Blood) .... Test Four Times Daily 15)  Onetouch Delica Lancets  Misc (Lancets) .... Test Four Times Daily 16)  Bd Pen Needle Short U/f 31g X 8 Mm Misc (Insulin Pen Needle) .... Use Four Times Daily 17)  Klonopin 1 Mg Tabs (Clonazepam) .Marland Kitchen.. 1 Tab Nightly For Restless Leg 18)  Lipitor 20 Mg Tabs (Atorvastatin Calcium) .... Take One Tablet At Bedtime  Allergies (verified): No Known Drug Allergies  Past History:  Past medical, surgical, family and social histories (including risk factors) reviewed, and no changes noted (except as noted below).  Past Medical History: Reviewed history from 01/20/2010 and no changes required. Diabetes mellitus, type II Anemia Hyperlipidemia HTN GERD Vit D deficiency Restless leg  Past Surgical History: Reviewed history from 11/01/2009 and no changes required. Hysterectomy-ovaries remain R arm radial tunnel release Cholecystectomy Disc replacement C4-C6 hand surgery 5/11  Family History: Reviewed history from 11/19/2009 and no changes required. colon cancer- no breast cancer- no MI- father  Social History: Reviewed  history from 01/08/2009 and no changes required. 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 Lungs:  Normal respiratory effort, chest expands symmetrically. Lungs are clear to  auscultation, no crackles or wheezes. Heart:  irregular S1/S2 Pulses:  +2 radial, DP/PT Extremities:  trace edema of hands and feet bilaterally   Impression & Recommendations:  Problem # 1:  DIABETES MELLITUS, TYPE II (ICD-250.00) Assessment Unchanged due for labs.  pt reports labile sugars despite taking meds regularly.  will likely need endo referral.  pt in agreement. Her updated medication list for this problem includes:    Diovan Hct 160-12.5 Mg Tabs (Valsartan-hydrochlorothiazide) .Marland Kitchen... 1 tab by mouth daily    Metformin Hcl 1000 Mg Tabs (Metformin hcl) .Marland Kitchen... Take one tablet two times a day    Novolog Flexpen 100 Unit/ml Soln (Insulin aspart) .Marland Kitchen... 12/13/08 units daily    Lantus Solostar 100 Unit/ml Soln (Insulin glargine) ..... Inject 60 units daily    [facility] Aspirin 81 Mg Tbec (Aspirin) .Marland Kitchen... Take one tablet daily  Orders: TLB-A1C / Hgb A1C (Glycohemoglobin) (83036-A1C) TLB-BMP (Basic Metabolic Panel-BMET) (80048-METABOL)  Problem # 2:  HYPERTENSION, BENIGN ESSENTIAL (ICD-401.1) Assessment: Unchanged well controlled but having numerous concerning cardiac sxs.  (see below) Her updated medication list for this problem includes:    Diovan Hct 160-12.5 Mg Tabs (Valsartan-hydrochlorothiazide) .Marland Kitchen... 1 tab by mouth daily    Cartia Xt 120 Mg Xr24h-cap (Diltiazem hcl coated beads) .Marland Kitchen... 1 tab by mouth daily  Problem # 3:  HYPERLIPIDEMIA (ICD-272.4) Assessment: Unchanged due for labs Her updated medication list for this problem includes:    Trilipix 135 Mg Cpdr (Choline fenofibrate) .Marland Kitchen... Take one tablet daily    Lipitor 20 Mg Tabs (Atorvastatin calcium) .Marland Kitchen... Take one tablet at bedtime  Orders: TLB-Lipid Panel (80061-LIPID) TLB-Hepatic/Liver Function Pnl (80076-HEPATIC)  Problem # 4:  TACHYCARDIA (ICD-785.0) Assessment: Deteriorated HR is 140 in office.  will fluctuate between 80-140.  pt having associated fatigue, chest pain, edema, SOB.  has discussed this w/ Dr Donnie Aho who  according to her is not concerned.  i find this concerning and agree that pt needs a 2nd opinion.  spoke w/ Luther Cards who are able to see her this AM.  pt given directions to office and is to proceed directly for evaluation. Orders: TLB-TSH (Thyroid Stimulating Hormone) (84443-TSH) EKG w/ Interpretation (93000)  Complete Medication List: 1)  Diovan Hct 160-12.5 Mg Tabs (Valsartan-hydrochlorothiazide) .Marland Kitchen.. 1 tab by mouth daily 2)  Trilipix 135 Mg Cpdr (Choline fenofibrate) .... Take one tablet daily 3)  Oxycodone-acetaminophen 5-325 Mg Tabs (Oxycodone-acetaminophen) .... Take 1-3 tablet every 6 hours as needed 4)  Trazodone Hcl 150 Mg Tabs (Trazodone hcl) .... Take 1-2 at bedtime as needed 5)  Metformin Hcl 1000 Mg Tabs (Metformin hcl) .... Take one tablet two times a day 6)  D-3 1000iu  .... Take one tablet daily 7)  Hydrocortisone Valerate 0.2 % Crea (Hydrocortisone valerate) .... Apply sparingly every 8 hours as needed 8)  Novolog Flexpen 100 Unit/ml Soln (Insulin aspart) .... 12/13/08 units daily 9)  Betamethasone Dipropionate 0.05 % Crea (Betamethasone dipropionate) .... Apply every 12 hrs to affected area 10)  Lantus Solostar 100 Unit/ml Soln (Insulin glargine) .... Inject 60 units daily 11)  [facility] Aspirin 81 Mg Tbec (  Aspirin) .... Take one tablet daily 12)  Nitrofurantoin Monohyd Macro 100 Mg Caps (Nitrofurantoin monohyd macro) .... Take one tablet at bedtime 13)  Cartia Xt 120 Mg Xr24h-cap (Diltiazem hcl coated beads) .Marland Kitchen.. 1 tab by mouth daily 14)  Onetouch Ultra Test Strp (Glucose blood) .... Test four times daily 15)  Onetouch Delica Lancets Misc (Lancets) .... Test four times daily 16)  Bd Pen Needle Short U/f 31g X 8 Mm Misc (Insulin pen needle) .... Use four times daily 17)  Klonopin 1 Mg Tabs (Clonazepam) .Marland Kitchen.. 1 tab nightly for restless leg 18)  Lipitor 20 Mg Tabs (Atorvastatin calcium) .... Take one tablet at bedtime  Patient Instructions: 1)  Go to [address] [facility]-  you will see Dr Juanda Chance now 2)  They will get your labs there 3)  I'll be in touch after your appt to see how things are going 4)  Hang in there! 5)  Happy Holidays!   Orders Added: 1)  TLB-A1C / Hgb A1C (Glycohemoglobin) [83036-A1C] 2)  TLB-BMP (Basic Metabolic Panel-BMET) [80048-METABOL] 3)  TLB-Lipid Panel [80061-LIPID] 4)  TLB-Hepatic/Liver Function Pnl [80076-HEPATIC] 5)  TLB-TSH (Thyroid Stimulating Hormone) [84443-TSH] 6)  Est. Patient Level IV [16109] 7)  EKG w/ Interpretation [93000]

## 2010-06-05 NOTE — Assessment & Plan Note (Signed)
Summary: Recall colonoscopy, on insulin, other GI complaints   History of Present Illness Visit Type: Initial Visit Primary GI MD: Yancey Flemings MD Primary Provider: Neena Rhymes, MD Chief Complaint: Recall colon/ patient diabetic / other GI complaints History of Present Illness:   60 year old female with multiple medical problems including morbid obesity, insulin requiring type 2 diabetes mellitus, hypertension, dyslipidemia, osteoarthritis, GERD, and adenomatous colon polyps. She also has a history of band gastroplasty in 1981 as well as prior hysterectomy and cholecystectomy. She presents today at regarding the need for surveillance colonoscopy. As well complaints of GERD and change in bowel habits. First, initial colonoscopy in 2003 revealed tubular adenoma. Followup in 2006 revealed hyperplastic polyp and mild diverticulosis. She is now due for followup. Next, she reports a change in bowel habits. Specifically, increased loose bowel movements with urgency over the past year. This occurs daily. Can lead to incontinence. Some abdominal cramping. Symptoms are worse postprandially. Low-dose Imodium seems to help.. No bleeding or weight loss. Apparently, noted significant change in medication. Has been on metformin for years. No nocturnal symptoms. Finally, problems with GERD. She takes Prevacid OTC. On medication, fairly good symptom control. No dysphagia. Off medication, significant symptoms. Prior upper endoscopy in 2003 revealed prior banded gastroplasty, but no Barrett's esophagus Savary dilation to 18 mm performed at that time for dysphagia. Despite her multiple diabetic medications, she has had some difficulty with her blood sugar. Review of outside laboratories from one week ago finds a normal CBC except for mild anemia with a hemoglobin of 11.7. Normal MCV. Also, normal comprehensive metabolic panel.   GI Review of Systems    Reports abdominal pain, acid reflux, and  chest pain.     Location  of  Abdominal pain: lower abdomen.    Denies belching, bloating, dysphagia with liquids, dysphagia with solids, heartburn, loss of appetite, nausea, vomiting, vomiting blood, weight loss, and  weight gain.      Reports change in bowel habits, diarrhea, and  fecal incontinence.     Denies anal fissure, black tarry stools, constipation, diverticulosis, heme positive stool, hemorrhoids, irritable bowel syndrome, jaundice, light color stool, liver problems, rectal bleeding, and  rectal pain. Preventive Screening-Counseling & Management  Alcohol-Tobacco     Smoking Status: current      Drug Use:  no.      Current Medications (verified): 1)  Diovan Hct 160-12.5 Mg Tabs (Valsartan-Hydrochlorothiazide) .Marland Kitchen.. 1 Tab By Mouth Daily 2)  Trilipix 135 Mg Cpdr (Choline Fenofibrate) .... Take One Tablet Daily 3)  Oxycodone-Acetaminophen 5-325 Mg Tabs (Oxycodone-Acetaminophen) .... Take 1-3 Tablet Every 6 Hours As Needed 4)  Trazodone Hcl 150 Mg Tabs (Trazodone Hcl) .... Take 1-2 At Bedtime As Needed 5)  Metformin Hcl 1000 Mg Tabs (Metformin Hcl) .... Take One Tablet Two Times A Day 6)  D-3 1000iu .... Take One Tablet Daily 7)  Hydrocortisone Valerate 0.2 % Crea (Hydrocortisone Valerate) .... Apply Sparingly Every 8 Hours As Needed 8)  Novolog Flexpen 100 Unit/ml Soln (Insulin Aspart) .... 12/13/08 Units Daily 9)  Betamethasone Dipropionate 0.05 % Crea (Betamethasone Dipropionate) .... Apply Every 12 Hrs To Affected Area 10)  Lantus Solostar 100 Unit/ml Soln (Insulin Glargine) .... Inject 60 Units Daily 11)  [facility] Aspirin 81 Mg Tbec (Aspirin) .... Take One Tablet Daily 12)  Nitrofurantoin Monohyd Macro 100 Mg Caps (Nitrofurantoin Monohyd Macro) .... Take One Tablet At Bedtime 13)  Cartia Xt 120 Mg Xr24h-Cap (Diltiazem Hcl Coated Beads) .Marland Kitchen.. 1 Tab By Mouth Daily 14)  Onetouch Ultra Test  Strp (Glucose Blood) .... Test Four Times Daily 15)  Onetouch Delica Lancets  Misc (Lancets) .... Test Four Times  Daily 16)  Bd Pen Needle Short U/f 31g X 8 Mm Misc (Insulin Pen Needle) .... Use Four Times Daily 17)  Klonopin 1 Mg Tabs (Clonazepam) .Marland Kitchen.. 1 Tab Nightly For Restless Leg 18)  Lipitor 20 Mg Tabs (Atorvastatin Calcium) .... Take One Tablet At Bedtime  Allergies (verified): No Known Drug Allergies  Past History:  Past Medical History: Diabetes mellitus, type II Anemia Hyperlipidemia HTN GERD Vit D deficiency Restless leg Hyperplastic Polyps Adenomatous Colon Polyps Diverticulosis Arthritis Hypertension  Past Surgical History: Reviewed history from 11/01/2009 and no changes required. Hysterectomy-ovaries remain R arm radial tunnel release Cholecystectomy Disc replacement C4-C6 hand surgery 5/11  Family History: colon cancer- no breast cancer- no MI- father Family History of Diabetes:  Family History of Heart Disease:   Social History: married works in Paramedic at [employer] 2 children- local 2 step children Patient currently smokes.  Alcohol Use - no Illicit Drug Use - no Drug Use:  no  Review of Systems       The patient complains of arthritis/joint pain, fatigue, headaches-new, night sweats, sleeping problems, swelling of feet/legs, urination - excessive, urine leakage, and voice change.  The patient denies allergy/sinus, anemia, anxiety-new, back pain, blood in urine, breast changes/lumps, change in vision, confusion, cough, coughing up blood, depression-new, fainting, fever, hearing problems, heart murmur, heart rhythm changes, itching, menstrual pain, muscle pains/cramps, nosebleeds, pregnancy symptoms, skin rash, sore throat, swollen lymph glands, thirst - excessive , urination - excessive , urination changes/pain, and vision changes.    Vital Signs:  Patient profile:   60 year old female Menstrual status:  perimenopausal Height:      62 inches Weight:      211.25 pounds BMI:     38.78 Pulse rate:   120 / minute Pulse rhythm:   regular BP  sitting:   118 / 76  (left arm) Cuff size:   regular  Vitals Entered By: June McMurray CMA Duncan Dull) (May 01, 2010 10:54 AM)  Physical Exam  General:  Well developed,obese, well nourished, no acute distress. Head:  Normocephalic and atraumatic. Eyes:  anicteric Ears:  Normal auditory acuity. Nose:  No deformity, discharge,  or lesions. Mouth:  No deformity or lesions. Orthodontic braces Neck:  Thick but Supple; no masses or thyromegaly. Lungs:  Clear throughout to auscultation. Heart:  Regular rate and rhythm; no murmurs, rubs,  or bruits. Abdomen:  Soft, obese,nontender and nondistended. No masses, hepatosplenomegaly or hernias noted. Normal bowel sounds. Prior surgical incisions well-healed. Rectal:  deferred until colonoscopy Msk:  Symmetrical with no gross deformities. Normal posture. Pulses:  Normal pulses noted. Extremities:  No clubbing, cyanosis, edema or deformities noted. Neurologic:  Alert and  oriented x4;  grossly normal neurologically. Skin:  Intact without significant lesions or rashes. Psych:  Alert and cooperative. Normal mood and affect.   Impression & Recommendations:  Problem # 1:  CHANGE IN BOWELS (ICD-787.99) change in bowel habits as manifested by urgency with loose stools and occasional incontinence. Could be secondary to medication. Could be bacterial overgrowth from diabetes. Could be irritable bowel type symptoms.  Plan: #1. Continue Imodium p.r.n. as this seems to work at low dose #2. Random colon biopsies with upcoming procedure to rule out microscopic colitis #3. Consider celiac testing in a diabetic  Problem # 2:  PERSONAL HISTORY OF COLONIC POLYPS (ICD-V12.72) history of  adenomatous colon polyps in 2003. Hyperplastic in 2005. Due for followup. She is high-risk due to her comorbidities, especially obesity, and the need to assess her multiple diabetic medications. However overall medically stable and appropriate for her surveillance  exam.  Plan: #1. Colonoscopy. The nature of the procedure as well as the risks, benefits, and alternatives were reviewed. She understood and agreed to proceed #2. Movi prep prescribed. The patient instructed on its use #3. Give one half evening Lantus insulin the evening prior to the procedure #4. Hold a.m. an oblong insulin as well as a.m. metformin until p.o. intake resumed on the day of the procedure  Problem # 3:  DIABETES MELLITUS, TYPE II (ICD-250.00) adjustment of diabetic medications for the procedure as outlined above. Also will monitor blood sugar medially before and after procedure per protocol.  Problem # 4:  GERD (ICD-530.81) GERD.  Plan: #1. Reflux precautions with particular attention to weight loss #2. Continue Prevacid OTC to control GERD symptoms #3. Follow up p.r.n. recurrent dysphagia  Other Orders: Colonoscopy (Colon)  Patient Instructions: 1)  Pick up your prep from your pharmacy.  2)  Colonoscopy and Flexible Sigmoidoscopy brochure given.  3)  Can take Imodium as needed. 4)  Avoid foods high in acid content ( tomatoes, citrus juices, spicy foods) . Avoid eating within 3 to 4 hours of lying down or before exercising. Do not over eat; try smaller more frequent meals. Elevate head of bed four inches when sleeping.  5)  Copy sent to : Neena Rhymes, MD 6)  The medication list was reviewed and reconciled.  All changed / newly prescribed medications were explained.  A complete medication list was provided to the patient / caregiver. Prescriptions: MOVIPREP 100 GM  SOLR (PEG-KCL-NACL-NASULF-NA ASC-C) As per prep instructions.  #1 x 0   Entered by:   Christie Nottingham CMA (AAMA)   Authorized by:   Hilarie Fredrickson MD   Signed by:   Christie Nottingham CMA (AAMA) on 05/01/2010   Method used:   [facility]  [address] [phone]* (retail)       [address]       [place], [place]  [address]       Ph: [phone] or  [phone]       Fax: [phone]   RxID:   [id number]

## 2010-06-05 NOTE — Letter (Signed)
Summary: Primary Care Appointment Letter  [facility]  [address] [place], [place] [address]   Phone: [phone]  Fax: [phone]    09/05/2009 MRN: [id number]  Lacey [name] [address] [place], [place]  [address]  Dear Ms. Sharene Skeans,   Your Primary Care Physician Neena Rhymes MD has indicated that:    _______it is time to schedule a fasting appointment w/ Dr. Beverely Low for follow up on cholesterol and blood pressure.    _______you missed your appointment on______ and need to call and          reschedule.    _______you need to have lab work done.    _______you need to schedule an appointment discuss lab or test results.    _______you need to call to reschedule your appointment that is                       scheduled on _________.     Please call our office as soon as possible. Our phone number is [phone]____. Our office is open 8a-5p, Monday through Friday.     Thank you,    [facility]

## 2010-06-05 NOTE — Procedures (Signed)
Summary: Colonoscopy   Colonoscopy  Procedure date:  04/28/2002  Findings:      Results: Polyp.  Results: Hemorrhoids.     Location:  [facility].    Procedures Next Due Date:    Colonoscopy: 05/2007 Patient Name: Lacey [name], Lacey [name]. MRN: [id number] Procedure Procedures: Colonoscopy CPT: [phone].    with polypectomy. CPT: [id number].  Personnel: Endoscopist: Wilhemina Bonito. Marina Goodell, MD.  Referred By: Margrett Rud, MD.  Exam Location: Exam performed in Endoscopy Suite.  Patient Consent: Procedure, Alternatives, Risks and Benefits discussed, consent obtained,  Indications  Average Risk Screening Routine.  History  Pre-Exam Physical: Performed Apr 28, 2002. Entire physical exam was normal.  Exam Exam: Extent of exam reached: Cecum, extent intended: Cecum.  The cecum was identified by appendiceal orifice and IC valve. Patient position: on left side. Colon retroflexion performed. Images taken. ASA Classification: II. Tolerance: excellent.  Monitoring: Pulse and BP monitoring, Oximetry used. Supplemental O2 given.  Colon Prep Used Visicol for colon prep. Prep results: good.  Fluoroscopy: Fluoroscopy was not used.  Sedation Meds: Demerol 80 mg. given IV. Versed 6 mg. given IV.  Findings POLYP: Ascending Colon, Maximum size: 4 mm. sessile polyp. Procedure:  snare with cautery, removed, retrieved, Polyp sent to pathology. ICD9: Colon Polyps: 211.3.  POLYP: Cecum, Maximum size: 5 mm. sessile polyp. Procedure:  snare with cautery, removed, retrieved, sent to pathology. ICD9: Colon Polyps: 211.3.  HEMORRHOIDS: Internal. ICD9: Hemorrhoids, Internal: 455.0.   Assessment Abnormal examination, see findings above.  Diagnoses: 211.3: Colon Polyps.  455.0: Hemorrhoids, Internal.   Events  Unplanned Interventions: No intervention was required.  Unplanned Events: There were no complications. Plans Patient Education: Patient given standard instructions for: Polyps.    Disposition: After procedure patient sent to recovery. After recovery patient sent home.  Scheduling/Referral: Colonoscopy, to Wilhemina Bonito. Marina Goodell, MD, in 3 or 5 years pending pathologic results.,  Call office for appointment, to Wilhemina Bonito. Marina Goodell, MD, in 6-8 weeks,    This report was created from the original endoscopy report, which was reviewed and signed by the above listed endoscopist.    cc: Margrett Rud, MD

## 2010-06-05 NOTE — Progress Notes (Signed)
Summary: simvastatin refill   Phone Note Refill Request Message from:  Patient on February 03, 2010 8:34 AM  Refills Requested: Medication #1:  SIMVASTATIN 40 MG TABS 1 tab daily.   Dosage confirmed as above?Dosage Confirmed   Supply Requested: [facility] months [facility] [address]  Next Appointment Scheduled: 04/24/10 Initial call taken by: Lavell Islam,  February 03, 2010 8:35 AM    Prescriptions: SIMVASTATIN 40 MG TABS (SIMVASTATIN) 1 tab daily  #90 x 0   Entered by:   Doristine Devoid CMA   Authorized by:   Neena Rhymes MD   Signed by:   Doristine Devoid CMA on 02/03/2010   Method used:   [facility]  [address] [phone]* (retail)       [address]       [place], [place]  [id number]       Ph: [phone] or [phone]       Fax: [phone]   RxID:   [id number]

## 2010-06-05 NOTE — Progress Notes (Signed)
Summary: Med change due to interaction  Phone Note From Pharmacy   [facility]  [address] [phone] Summary of Call: received information from pharmacy about potential interaction w/ simvastatin and diltiazem  per Dr. Beverely Low will switch to lipitor 20mg  but she can finish what she has and since lipitor will be generic this fall will handle prescription then.  left message on machine .........Marland KitchenDoristine Devoid CMA  February 04, 2010 10:36 AM   Follow-up for Phone Call        Patient notified. Follow-up by: Lucious Groves CMA,  February 04, 2010 3:28 PM    New/Updated Medications: LIPITOR 20 MG TABS (ATORVASTATIN CALCIUM) take one tablet at bedtime

## 2010-06-05 NOTE — Assessment & Plan Note (Signed)
Summary: fasting roa//lch   Vital Signs:  Patient profile:   60 year old female Height:      62.50 inches Weight:      205 pounds BMI:     37.03 Pulse rate:   138 / minute BP sitting:   120 / 64  (left arm)  Vitals Entered By: Doristine Devoid (November 01, 2009 9:25 AM) CC: ROA AND LABS    History of Present Illness: 60 yo woman here today for   1) HTN- good control.  taking the lisinopril and amlodipine.  'i have CP all the time'- entire cardiac w/u negative.  no SOB, HAs, edema.  2) Hyperlipidemia- due for labs.  no abd pain, intermittant nausea.  no vomiting.  3) Tachycardia- pt reports HR has been high for 18 months 'at least'.  was previously on beta blocker but 'bottomed out' and 'almost hit the floor'.  4) Diabetes- pt would like me to assume care for this.  thinks she is due for A1C this month b/c she has upcoming appt w/ Dr Lucianne Muss.  denies symptomatic lows.  not sure how CBGs have been running.  Allergies (verified): No Known Drug Allergies  Past History:  Past Medical History: Last updated: 01/08/2009 Diabetes mellitus, type II Anemia Hyperlipidemia HTN GERD  Social History: Last updated: 01/08/2009 married works in office at [employer] 2 children- local 2 step children  Past Surgical History: Hysterectomy-ovaries remain R arm radial tunnel release Cholecystectomy Disc replacement C4-C6 hand surgery 5/11  Review of Systems      See HPI  Physical Exam  General:  Well-developed,well-nourished,in no acute distress; alert,appropriate and cooperative throughout examination Head:  NCAT Lungs:  Normal respiratory effort, chest expands symmetrically. Lungs are clear to auscultation, no crackles or wheezes. Heart:  Normal rate and regular rhythm. S1 and S2 normal without gallop, murmur, click, rub or other extra sounds. Abdomen:  soft, NT/ND, +BS Pulses:  +2 carotid, radial, DP Extremities:  no C/C/E Neurologic:  alert & oriented X3, cranial nerves  II-XII intact, gait normal, and DTRs symmetrical and normal.     Impression & Recommendations:  Problem # 1:  HYPERTENSION, BENIGN ESSENTIAL (ICD-401.1) Assessment Unchanged adequately controlled.  will stop amlodipine to start Dilt for tachycardia.  do not want to drop BP too low. The following medications were removed from the medication list:    Amlodipine Besylate 10 Mg Tabs (Amlodipine besylate) ..... Once daily Her updated medication list for this problem includes:    Lisinopril-hydrochlorothiazide 20-12.5 Mg Tabs (Lisinopril-hydrochlorothiazide) .Marland Kitchen... 1 tablet by mouth daily    Cartia Xt 120 Mg Xr24h-cap (Diltiazem hcl coated beads) .Marland Kitchen... 1 tab by mouth daily  Orders: TLB-BMP (Basic Metabolic Panel-BMET) (80048-METABOL) TLB-CBC Platelet - w/Differential (85025-CBCD) EKG w/ Interpretation (93000)  Problem # 2:  HYPERLIPIDEMIA (ICD-272.4) Assessment: Unchanged due for labs.  adjust meds as needed. Her updated medication list for this problem includes:    Trilipix 135 Mg Cpdr (Choline fenofibrate) .Marland Kitchen... Take one tablet daily    Simvastatin 40 Mg Tabs (Simvastatin) .Marland Kitchen... Take one tablet daily  Orders: TLB-Lipid Panel (80061-LIPID) TLB-Hepatic/Liver Function Pnl (80076-HEPATIC)  Problem # 3:  DIABETES MELLITUS, TYPE II (ICD-250.00) Assessment: Unchanged would like me to assume care for this.  need to get A1C to assess starting point.  will adjust meds as needed. Her updated medication list for this problem includes:    Lisinopril-hydrochlorothiazide 20-12.5 Mg Tabs (Lisinopril-hydrochlorothiazide) .Marland Kitchen... 1 tablet by mouth daily    Metformin Hcl 1000 Mg Tabs (  Metformin hcl) .Marland Kitchen... Take one tablet two times a day    Novolog Flexpen 100 Unit/ml Soln (Insulin aspart) .Marland Kitchen... 6/6/6 units daily    Lantus Solostar 100 Unit/ml Soln (Insulin glargine) ..... Inject 52 units daily    [facility] Aspirin 81 Mg Tbec (Aspirin) .Marland Kitchen... Take one tablet daily  Orders: Venipuncture (82956) TLB-A1C  / Hgb A1C (Glycohemoglobin) (83036-A1C) TLB-TSH (Thyroid Stimulating Hormone) (84443-TSH)  Problem # 4:  TACHYCARDIA (ICD-785.0) Assessment: New has had cards w/u- previously on beta blocker but this dropped BP considerably.  will stop amlodipine and start Dilt.  Complete Medication List: 1)  Lisinopril-hydrochlorothiazide 20-12.5 Mg Tabs (Lisinopril-hydrochlorothiazide) .Marland Kitchen.. 1 tablet by mouth daily 2)  Trilipix 135 Mg Cpdr (Choline fenofibrate) .... Take one tablet daily 3)  Oxycodone-acetaminophen 5-325 Mg Tabs (Oxycodone-acetaminophen) .... Take 1-3 tablet every 6 hours as needed 4)  Simvastatin 40 Mg Tabs (Simvastatin) .... Take one tablet daily 5)  Vitamin D (ergocalciferol) 50000 Unit Caps (Ergocalciferol) .... Take one tablet every 2 weeks 6)  Ferrous Sulfate 325 (65 Fe) Mg Tbec (Ferrous sulfate) .... Take one tablet daily 7)  Trazodone Hcl 150 Mg Tabs (Trazodone hcl) .... Take 1-2 at bedtime as needed 8)  Metformin Hcl 1000 Mg Tabs (Metformin hcl) .... Take one tablet two times a day 9)  Hair Grain Vitamin  .... Take one tablet daily 10)  Calcium-magnesium-zinc 750-375-15 Mg Tabs (Calcium-magnesium-zinc) .... Take one tablet daily 11)  Orphenadrine Citrate Cr 100 Mg Xr12h-tab (Orphenadrine citrate) .... Take one tablet two times a day 12)  D-3 1000iu  .... Take one tablet daily 13)  Hydrocortisone Valerate 0.2 % Crea (Hydrocortisone valerate) .... Apply sparingly every 8 hours as needed 14)  Novolog Flexpen 100 Unit/ml Soln (Insulin aspart) .... 6/6/6 units daily 15)  Betamethasone Dipropionate 0.05 % Crea (Betamethasone dipropionate) .... Apply every 12 hrs to affected area 16)  Lantus Solostar 100 Unit/ml Soln (Insulin glargine) .... Inject 52 units daily 17)  [facility] Aspirin 81 Mg Tbec (Aspirin) .... Take one tablet daily 18)  Flexeril 10 Mg Tabs (Cyclobenzaprine hcl) .Marland Kitchen.. 1 by mouth three times a day 19)  Nitrofurantoin Monohyd Macro 100 Mg Caps (Nitrofurantoin monohyd macro)  .... Take one tablet at bedtime 20)  Nucynta 50 Mg Tabs (Tapentadol hcl) .... Take one tablet three times a day as needed for pain 21)  Cartia Xt 120 Mg Xr24h-cap (Diltiazem hcl coated beads) .Marland Kitchen.. 1 tab by mouth daily 22)  Onetouch Ultra Test Strp (Glucose blood) .... Test four times daily 23)  Onetouch Delica Lancets Misc (Lancets) .... Test four times daily 24)  Bd Pen Needle Short U/f 31g X 8 Mm Misc (Insulin pen needle) .... Use four times daily  Other Orders: T-Vitamin D (25-Hydroxy) [phone])  Patient Instructions: 1)  Please schedule your complete physical in 3-4 weeks 2)  Start the Cartizem daily 3)  STOP the amlodipine 4)  We'll notify you of your lab results 5)  Call with any questions or concerns 6)  Happy 4th of July! Prescriptions: BD PEN NEEDLE SHORT U/F 31G X 8 MM MISC (INSULIN PEN NEEDLE) use four times daily  #150 x 3   Entered by:   Doristine Devoid   Authorized by:   Neena Rhymes MD   Signed by:   Doristine Devoid on 11/01/2009   Method used:   Faxed to ...       [facility] (mail-order)             , [place]  Ph: [phone]       Fax: [phone]   RxID:   [id number] [facility] DELICA LANCETS  MISC (LANCETS) test four times daily  #150 x 3   Entered by:   Doristine Devoid   Authorized by:   Neena Rhymes MD   Signed by:   Doristine Devoid on 11/01/2009   Method used:   Faxed to ...       [facility] (mail-order)             , [place]         Ph: [phone]       Fax: [phone]   RxID:   [id number] ONETOUCH ULTRA TEST  STRP (GLUCOSE BLOOD) test four times daily  #150 x 3   Entered by:   Doristine Devoid   Authorized by:   Neena Rhymes MD   Signed by:   Doristine Devoid on 11/01/2009   Method used:   Faxed to ...       [facility] (mail-order)             , [place]         Ph: [phone]       Fax: [phone]   RxID:   [id number] HYDROCORTISONE VALERATE 0.2 % CREA (HYDROCORTISONE VALERATE) apply sparingly every 8 hours as needed  #1 tube x 1   Entered  and Authorized by:   Neena Rhymes MD   Signed by:   Neena Rhymes MD on 11/01/2009   Method used:   Electronically to        [facility]  [address] [phone]* (retail)       [address]       [place], [place]  [id number]       Ph: [phone] or [phone]       Fax: [phone]   RxID:   [id number] BETAMETHASONE DIPROPIONATE 0.05 % CREA (BETAMETHASONE DIPROPIONATE) apply every 12 hrs to affected area  #1 tube x 1   Entered and Authorized by:   Neena Rhymes MD   Signed by:   Neena Rhymes MD on 11/01/2009   Method used:   [facility]        [facility]  [address] [phone]* (retail)       [address]       [place], [place]  [address]       Ph: [phone] or [phone]       Fax: [phone]   RxID:   [id number] LANTUS SOLOSTAR 100 UNIT/ML SOLN (INSULIN GLARGINE) inject 52 units daily  #1 month x 1   Entered and Authorized by:   Neena Rhymes MD   Signed by:   Neena Rhymes MD on 11/01/2009   Method used:   [facility]        [facility]  [address] [phone]* (retail)       [address]       [place], [place]  [address]       Ph: [phone] or [phone]       Fax: [phone]   RxID:   [id number] CARTIA XT 120 MG XR24H-CAP (DILTIAZEM HCL COATED BEADS) 1 tab by mouth daily  #30 x 1   Entered and Authorized by:   Neena Rhymes MD   Signed by:   Neena Rhymes MD on 11/01/2009   Method used:  [facility]        [facility]  [address] [phone]* (retail)       [address]       [place], [place]  [address]       Ph: [phone] or [phone]       Fax: [phone]   RxID:   [id number] LANTUS SOLOSTAR 100 UNIT/ML SOLN (INSULIN GLARGINE) inject 52 units daily  #3 months x 3   Entered and Authorized by:   Neena Rhymes MD   Signed by:   Neena Rhymes MD on 11/01/2009   Method used:   Faxed to ...        [facility] (mail-order)             , [place]         Ph: [phone]       Fax: [phone]   RxID:   [id number] BETAMETHASONE DIPROPIONATE 0.05 % CREA (BETAMETHASONE DIPROPIONATE) apply every 12 hrs to affected area  #3 x 3   Entered and Authorized by:   Neena Rhymes MD   Signed by:   Neena Rhymes MD on 11/01/2009   Method used:   Faxed to ...       [facility] (mail-order)             , [place]         Ph: [phone]       Fax: [phone]   RxID:   [id number] NOVOLOG FLEXPEN 100 UNIT/ML SOLN (INSULIN ASPART) 6/6/6 units daily  #3 months x 3   Entered and Authorized by:   Neena Rhymes MD   Signed by:   Neena Rhymes MD on 11/01/2009   Method used:   Faxed to ...       [facility] (mail-order)             , [place]         Ph: [phone]       Fax: [phone]   RxID:   [id number] HYDROCORTISONE VALERATE 0.2 % CREA (HYDROCORTISONE VALERATE) apply sparingly every 8 hours as needed  #3 x 3   Entered and Authorized by:   Neena Rhymes MD   Signed by:   Neena Rhymes MD on 11/01/2009   Method used:   Faxed to ...       [facility] (mail-order)             , [place]         Ph: [phone]       Fax: [phone]   RxID:   [id number] METFORMIN HCL 1000 MG TABS (METFORMIN HCL) take one tablet two times a day  #90 x 3   Entered and Authorized by:   Neena Rhymes MD   Signed by:   Neena Rhymes MD on 11/01/2009   Method used:   Faxed to ...       [facility] (mail-order)             , [place]         Ph: [phone]       Fax: [phone]   RxID:   [id number] SIMVASTATIN 40 MG TABS (SIMVASTATIN) take one tablet daily  #90 x 3   Entered and Authorized by:   Neena Rhymes MD   Signed by:   Neena Rhymes MD on 11/01/2009   Method used:   Faxed to ...       [facility] (mail-order)             ,  [place]         Ph: [phone]       Fax: [phone]   RxID:   [id number] TRILIPIX 135 MG CPDR (CHOLINE FENOFIBRATE) take one tablet daily  #90 x 3   Entered and  Authorized by:   Neena Rhymes MD   Signed by:   Neena Rhymes MD on 11/01/2009   Method used:   Faxed to ...       [facility] (mail-order)             , [place]         Ph: [phone]       Fax: [phone]   RxID:   [id number] LISINOPRIL-HYDROCHLOROTHIAZIDE 20-12.5 MG TABS (LISINOPRIL-HYDROCHLOROTHIAZIDE) 1 tablet by mouth daily  #90 x 3   Entered and Authorized by:   Neena Rhymes MD   Signed by:   Neena Rhymes MD on 11/01/2009   Method used:   Faxed to ...       [facility] (mail-order)             , [place]         Ph: [phone]       Fax: [phone]   RxID:   [id number]

## 2010-06-05 NOTE — Progress Notes (Signed)
Summary: Nuclear pre procedure  Phone Note Outgoing Call Call back at Home Phone [phone]   Call placed by: Rea College, CMA,  April 29, 2010 5:29 PM Call placed to: Patient Summary of Call: Reviewed information on My[employer] Information Sheet (see scanned document for further details).  Thurston Hole spoke with patient.       Nuclear Med Background Indications for Stress Test: Evaluation for Ischemia   History: Heart Catheterization, Myocardial Perfusion Study  History Comments: '01 UJW:JXBJYN, EF=67%; '06 Cath:normal, EF=55%  Symptoms: Chest Pain, Chest Pain with Exertion, Palpitations, Rapid HR    Nuclear Pre-Procedure Cardiac Risk Factors: Family History - CAD, History of Smoking, Hypertension, IDDM Type 2, Lipids Height (in): 62.5

## 2010-06-05 NOTE — Progress Notes (Signed)
Summary: returning your call  Phone Note Call from Patient Call back at Work Phone [phone]   Caller: Patient Reason for Call: Talk to Nurse Summary of Call: pt returning you call re drugstore Initial call taken by: Roe Coombs,  May 08, 2010 10:30 AM  Follow-up for Phone Call        I talked with pt--pt was able to get Toprol XL without prior authorization through [facility]

## 2010-06-05 NOTE — Progress Notes (Signed)
Summary: labs-  Phone Note Outgoing Call   Call placed by: Doristine Devoid,  November 05, 2009 1:39 PM Call placed to: Patient Summary of Call: pt's cholesterol and triglycerides not at goal despite being on Simvastatin 40mg  and Trilipix.  needs to continue Trilipix and switch to Lipitor 40mg  nightly- we have coupon cards available.  also needs to increase mealtime Novolog to 8/8/8 (currently on 6/6/6).  fasting sugar of 112 is ok, but w/ A1C of 6.9 needs to improve glucose control.  needs to make Novolog change and pay close attention to diet.  will follow in 3 months.  rest of labs look good.  Follow-up for Phone Call        tried calling patient no answer no machine will try later..............Marland KitchenDoristine Devoid  November 05, 2009 1:40 PM   spoke w/ patient aware of labs and that medication needs to be changed given samples and co-pay card and says she is currently doing 6/10/8 on novolog.Marland KitchenMarland KitchenMarland KitchenDo you want to make additional change?  Additional Follow-up for Phone Call Additional follow up Details #1::        increase novolog to 12/13/08 for better sugar control Additional Follow-up by: Neena Rhymes MD,  November 07, 2009 3:48 PM    Additional Follow-up for Phone Call Additional follow up Details #2::    spoke w/ patient aware of change in medication ............Marland KitchenDoristine Devoid  November 07, 2009 4:34 PM   New/Updated Medications: LIPITOR 40 MG TABS (ATORVASTATIN CALCIUM) take one tablet at bedtime NOVOLOG FLEXPEN 100 UNIT/ML SOLN (INSULIN ASPART) 12/13/08 units daily Prescriptions: LIPITOR 40 MG TABS (ATORVASTATIN CALCIUM) take one tablet at bedtime  #28 x 0   Entered by:   Doristine Devoid   Authorized by:   Neena Rhymes MD   Signed by:   Doristine Devoid on 11/07/2009   Method used:   Samples Given   RxID:   [id number]

## 2010-06-05 NOTE — Procedures (Signed)
Summary: Colonoscopy   Colonoscopy  Procedure date:  04/09/2005  Findings:      Results: Polyp.  Results: Diverticulosis.       Location:  [facility].    Procedures Next Due Date:    Colonoscopy: 04/2010 [name] Name: Lacey, [name]. MRN: [id number] Procedure Procedures: Colonoscopy CPT: [phone].    with polypectomy. CPT: [id number].  Personnel: Endoscopist: Wilhemina Bonito. Marina Goodell, MD.  Exam Location: Exam performed in Outpatient Clinic. Outpatient  [name] Consent: Procedure, Alternatives, Risks and Benefits discussed, consent obtained, from [name]. Consent was obtained by the RN.  Indications  Surveillance of: Adenomatous Polyp(s). This is an initial surveillance exam. Initial polypectomy was performed in 2003. in Dec. 1-2 Polyps were found at Index Exam. Largest polyp removed was 1 to 5 mm. Prior polyp located in proximal (splenic flexure and beyond) colon. Pathology of worst  polyp: tubular adenoma.  History  Current Medications: [name] is not currently taking Coumadin.  Pre-Exam Physical: Performed Apr 09, 2005. Cardio-pulmonary exam, HEENT exam , Abdominal exam, Mental status exam WNL.  Exam Exam: Extent of exam reached: Cecum, extent intended: Cecum.  Lacey cecum was identified by appendiceal orifice and IC valve. [name] position: on left side. Colon retroflexion performed. Images taken. ASA Classification: II. Tolerance: excellent.  Monitoring: Pulse and BP monitoring, Oximetry used. Supplemental O2 given.  Colon Prep Used Miralax for colon prep. Prep results: excellent.  Sedation Meds: [name] assessed and found to be appropriate for moderate (conscious) sedation. Fentanyl 125 mcg. given IV. Versed 12 given IV.  Findings NORMAL EXAM: Cecum to Rectum.  POLYP: Cecum, diminutive, sessile polyp. Procedure:  snare without cautery, removed, retrieved, Polyp sent to pathology. ICD9: Colon Polyps: 211.3.  DIVERTICULOSIS: Sigmoid Colon. ICD9: Diverticulosis,  Colon: 562.10. Comments: mild changes.   Assessment  Diagnoses: 211.3: Colon Polyps.  562.10: Diverticulosis, Colon.   Events  Unplanned Interventions: No intervention was required.  Unplanned Events: There were no complications. Plans Disposition: After procedure [name] sent to recovery. After recovery [name] sent home.  Scheduling/Referral: Colonoscopy, to Wilhemina Bonito. Marina Goodell, MD, in 5 years,    This report was created from Lacey original endoscopy report, which was reviewed and signed by Lacey above listed endoscopist.    cc: Margrett Rud, MD    Lacey [name]

## 2010-06-06 ENCOUNTER — Telehealth (INDEPENDENT_AMBULATORY_CARE_PROVIDER_SITE_OTHER): Payer: Self-pay | Admitting: *Deleted

## 2010-06-09 ENCOUNTER — Institutional Professional Consult (permissible substitution) (INDEPENDENT_AMBULATORY_CARE_PROVIDER_SITE_OTHER): Payer: BC Managed Care – PPO | Admitting: Internal Medicine

## 2010-06-09 ENCOUNTER — Encounter: Payer: Self-pay | Admitting: Internal Medicine

## 2010-06-09 DIAGNOSIS — I10 Essential (primary) hypertension: Secondary | ICD-10-CM

## 2010-06-09 DIAGNOSIS — I5032 Chronic diastolic (congestive) heart failure: Secondary | ICD-10-CM | POA: Insufficient documentation

## 2010-06-09 DIAGNOSIS — R0602 Shortness of breath: Secondary | ICD-10-CM

## 2010-06-09 DIAGNOSIS — I471 Supraventricular tachycardia: Secondary | ICD-10-CM

## 2010-06-11 ENCOUNTER — Other Ambulatory Visit (AMBULATORY_SURGERY_CENTER): Payer: BC Managed Care – PPO | Admitting: Internal Medicine

## 2010-06-11 ENCOUNTER — Other Ambulatory Visit: Payer: Self-pay | Admitting: Internal Medicine

## 2010-06-11 DIAGNOSIS — K5730 Diverticulosis of large intestine without perforation or abscess without bleeding: Secondary | ICD-10-CM

## 2010-06-11 DIAGNOSIS — D126 Benign neoplasm of colon, unspecified: Secondary | ICD-10-CM

## 2010-06-11 DIAGNOSIS — Z1211 Encounter for screening for malignant neoplasm of colon: Secondary | ICD-10-CM

## 2010-06-11 DIAGNOSIS — Z86010 Personal history of colonic polyps: Secondary | ICD-10-CM

## 2010-06-11 NOTE — Progress Notes (Signed)
Summary: blood in stool  Phone Note Call from Patient Call back at [phone]   Caller: Patient Summary of Call: patient left msg on voicemail that on sat. and sun. noticed bright red blood in stool has colonoscopy scheduled for next wed. just wanted to know if there was anything she need to do. Initial call taken by: Doristine Devoid CMA,  June 02, 2010 9:23 AM  Follow-up for Phone Call        spoke w/ patient after informing Dr. Beverely Low advised as long a there isn't any gushing or pain patient ok to wait until she recieves colonoscopy informed patient says that symptoms have imroved since the weekend but instructed to contact office if symptoms become worsen and she developes any new symptoms patient ok'd information .....Marland KitchenMarland KitchenDoristine Devoid CMA  June 02, 2010 9:52 AM     Prescriptions: LIPITOR 20 MG TABS (ATORVASTATIN CALCIUM) take one tablet at bedtime  #90 Tablet x 1   Entered by:   Doristine Devoid CMA   Authorized by:   Neena Rhymes MD   Signed by:   Doristine Devoid CMA on 06/02/2010   Method used:   Faxed to ...       [facility] (mail-order)             , [place]         Ph: [phone]       Fax: [phone]   RxID:   [id number]

## 2010-06-16 ENCOUNTER — Encounter: Payer: Self-pay | Admitting: Internal Medicine

## 2010-06-19 NOTE — Assessment & Plan Note (Signed)
Summary: nep/svt and anrvt/per amber/saf/kl   Primary Provider:  Neena Rhymes, MD  CC:  nep/svt and anrvt/per patient she is currently not having symptoms but may have symptoms once or twice a day.Marland Kitchen  History of Present Illness:  Lacey [name] is seen at the request of Dr.Tabori because of palpitations.  these have been long-standing over the last couple of years. She noted that in the beginning he mostly felt like they were hard. More recently they have been notable for being "fast". She understands and has been gradual in onset and offset. They were first identified most recently when she went to see her primary care physician an electrocardiogram identified a heart rate of 125 0r so she has significant exercise intolerance associated with chest discomfort as well as diaphoresis and lightheadedness.  she has some peripheral edema nocturnal dyspnea but no orthopnea. She does not take a diuretic. She is on a calcium blocker.  After having seen Dr. Juanda Chance in December for similar complaints, she underwent a Myoview scan which was negative an echo which was normal apart from diastolic dysfunction. More remotely she had a catheterization by Dr. Donnie Aho.  She also underwent a Holter monitor which is what prompted this referral. This demonstrated significant tachycardia that was diurnal. Her average heart rate during waking hours is in the 120 range plus or -10 her nocturnal heart rates are in the Lacey and 90s. Holter monitor also shows a multiple episodes of abrupt onset and offset of the tachycardia with cycle lengths in the 500-600 range.She was started on Toprol 25 twice daily. This has been associated with a decrease in her heart rate but no significant improvement in her symptoms  laboratory evaluation at that time demonstrated a mildly low hemoglobin 11.7; her TSH was normal She has significant fatigue and had a sleep study he thinks more than a decade ago        Current Medications  (verified): 1)  Diovan Hct 160-12.5 Mg Tabs (Valsartan-Hydrochlorothiazide) .Marland Kitchen.. 1 Tab By Mouth Daily 2)  Trilipix 135 Mg Cpdr (Choline Fenofibrate) .... Take One Tablet Daily 3)  Oxycodone-Acetaminophen 5-325 Mg Tabs (Oxycodone-Acetaminophen) .... Take 1-3 Tablet Every 6 Hours As Needed 4)  Trazodone Hcl 150 Mg Tabs (Trazodone Hcl) .... Take 1-2 At Bedtime As Needed 5)  Metformin Hcl 1000 Mg Tabs (Metformin Hcl) .... Take One Tablet Two Times A Day 6)  D-3 1000iu .... Take One Tablet Daily 7)  Hydrocortisone Valerate 0.2 % Crea (Hydrocortisone Valerate) .... Apply Sparingly Every 8 Hours As Needed 8)  Novolog Flexpen 100 Unit/ml Soln (Insulin Aspart) .... 12/13/08 Units Daily 9)  Betamethasone Dipropionate 0.05 % Crea (Betamethasone Dipropionate) .... Apply Every 12 Hrs To Affected Area 10)  Lantus Solostar 100 Unit/ml Soln (Insulin Glargine) .... Inject 60 Units Daily 11)  [facility] Joseph Aspirin 81 Mg Tbec (Aspirin) .... Take One Tablet Daily 12)  Nitrofurantoin Monohyd Macro 100 Mg Caps (Nitrofurantoin Monohyd Macro) .... Take One Tablet At Bedtime 13)  Cartia Xt 120 Mg Xr24h-Cap (Diltiazem Hcl Coated Beads) .Marland Kitchen.. 1 Tab By Mouth Daily 14)  Onetouch Ultra Test  Strp (Glucose Blood) .... Test Four Times Daily 15)  Onetouch Delica Lancets  Misc (Lancets) .... Test Four Times Daily 16)  Bd Pen Needle Short U/f 31g X 8 Mm Misc (Insulin Pen Needle) .... Use Four Times Daily 17)  Klonopin 1 Mg Tabs (Clonazepam) .Marland Kitchen.. 1 Tab Nightly For Restless Leg 18)  Lipitor 20 Mg Tabs (Atorvastatin Calcium) .... Take One Tablet At Bedtime  19)  Toprol Xl 25 Mg Xr24h-Tab (Metoprolol Succinate) .... One Twice A Day  Allergies (verified): No Known Drug Allergies  Past History:  Past Medical History: Last updated: 06/09/2010 SVT Diabetes mellitus, type II Anemia Hyperlipidemia HTN GERD Vit D deficiency Restless leg Hyperplastic Polyps Adenomatous Colon Polyps Diverticulosis Arthritis Hypertension  Past  Surgical History: Last updated: 11/01/2009 Hysterectomy-ovaries remain R arm radial tunnel release Cholecystectomy Disc replacement C4-C6 hand surgery 5/11  Family History: Last updated: 05/01/2010 colon cancer- no breast cancer- no MI- father Family History of Diabetes:  Family History of Heart Disease:   Social History: Last updated: 05/01/2010 married works in Paramedic at [employer] 2 children- local 2 step children Patient currently smokes.  Alcohol Use - no Illicit Drug Use - no  Vital Signs:  Patient profile:   60 year old [name] Menstrual status:  perimenopausal Height:      62 inches Weight:      219 pounds BMI:     40.20 Pulse rate:   95 / minute Pulse rhythm:   irregular BP sitting:   162 / 70  (left arm) Cuff size:   regular  Vitals Entered By: Judithe Modest CMA (June 09, 2010 3:11 PM)  Physical Exam  General:  Well developed,obese, well nourished,older Caucasian [name] appearing her stated age no acute distress. Head:  normal HEENT Neck:  JP 7-8 supple without thyromegaly Chest Wall:  without kyphosis or scoliosis Lungs:  clear to auscultation Heart:  regular rate and rhythm without murmurs or gallops Abdomen:  soft nontender without hepatomegaly positive HJR Msk:  without obvious musculoskeletal defects Pulses:  intact distal pulses Extremities:  no clubbing cyanosis trace edema Neurologic:  alert and oriented and grossly normal motor and sensory function Skin:  warm and dry Cervical Nodes:  without adenopathy Psych:  in gauge affect   EKG  Procedure date:  06/09/2010  Findings:      sinus rhythm with frequent PACs 88 beats per minute intervals 0.13/0.0 weight 5.35 Axis is 45 Occasional PVCs left bundle-branch block inferior axis  Holter Monitor  Procedure date:  06/09/2010  Findings:      as noted above multiple episodes of tachycardia average rates today 110-140 nocturnal Lacey-85  Impression &  Recommendations:  Problem # 1:  ATRIAL TACHYCARDIA (ICD-427.89) the patient has atrial tachycardia with quite rapid ventricular rates. In the context of diastolic dysfunction is likely contributes to her dyspnea on exertion as a manifestation of diastolic heart failure. Treatment options start with time to slow the heart rate down. The fact that there is diurnal variation suggest that this may be particularly catecholamine sensitive; we will thus try to use beta blockers exchanging her calcium blockers for this. In the event that this is unsuccessful, and given the multiple morphologies of P waves identified it may well be, we will use an antiarrhythmic drug. The following medications were removed from the medication list:    Cartia Xt 120 Mg Xr24h-cap (Diltiazem hcl coated beads) .Marland Kitchen... 1 tab by mouth daily Her updated medication list for this problem includes:    [facility] Joseph Aspirin 81 Mg Tbec (Aspirin) .Marland Kitchen... Take one tablet daily    Toprol Xl 50 Mg Xr24h-tab (Metoprolol succinate) .Marland Kitchen... Take one tablet two times a day  Problem # 2:  DIASTOLIC HEART FAILURE, CHRONIC (ICD-428.32) as noted above; I withhold diuretic at this point to see if we can accomplish what we will not simply by using beta blockers.I suspect she'll need diuresis more than  she is getting withHCTZ  Problem # 3:  HYPERTENSION, HEART CONTROLLED W/ CHF (ICD-402.11) her blood pressure is quite elevated. We'll need to be qute aggressive in control.  she says its higher than normal  today, perhaps becasue I saw pts out of order and she had a long wait  Patient Instructions: 1)  Your physician recommends that you schedule a follow-up appointment in: 8 weeks with Dr.Klein   2)  Your physician has recommended you make the following change in your medication:  3)  Increase Toprol to 50 mg two times a day 4)  Discontinue Cartia Prescriptions: TOPROL XL 50 MG XR24H-TAB (METOPROLOL SUCCINATE) take one tablet two times a day  #60 x 3    Entered by:   Judithe Modest CMA   Authorized by:   Nathen May, MD, [facility]   Signed by:   Judithe Modest CMA on 06/09/2010   Method used:   Electronically to        [facility]  [address] [phone]* (retail)       [address]       [place], [place]  [id number]       Ph: [phone] or [phone]       Fax: [phone]   RxID:   [id number]

## 2010-06-19 NOTE — Progress Notes (Signed)
Summary: Prior Auth not needed  Phone Note Refill Request Message from:  Pharmacy on June 06, 2010 8:13 AM  Refills Requested: Medication #1:  DIOVAN HCT 160-12.5 MG TABS 1 tab by mouth daily   Dosage confirmed as above?Dosage Confirmed [facility] [address]   Next Appointment Scheduled: none Initial call taken by: Harold Barban,  June 06, 2010 8:13 AM  Follow-up for Phone Call        No PA needed med run under wrong insurance, pharmacy informed.Marland KitchenMarland KitchenMarland KitchenFelecia Deloach CMA  June 09, 2010 11:37 AM

## 2010-06-25 NOTE — Procedures (Signed)
Summary: Colonoscopy   Colonoscopy  Procedure date:  06/11/2010  Findings:      Location:  [facility].   COLONOSCOPY PROCEDURE REPORT  PATIENT:  Lacey [name], Lacey [name]  MR#:  [id number] BIRTHDATE:   04-05-1951, 60 yrs. old   GENDER:   female ENDOSCOPIST:   Wilhemina Bonito. Eda Keys, MD REF. BY: Surveillance Program Recall, PROCEDURE DATE:  06/11/2010 PROCEDURE:  Colonoscopy with snare polypectomy x 5 ASA CLASS:   Class II INDICATIONS: history of pre-cancerous (adenomatous) colon polyps, surveillance and high-risk screening ; index 2003 w/ TA; f/u 2005 MEDICATIONS:    Fentanyl 125 mcg IV, Versed 12 mg IV  DESCRIPTION OF PROCEDURE:   After the risks benefits and alternatives of the procedure were thoroughly explained, informed consent was obtained.  Digital rectal exam was performed and revealed no abnormalities.   The LB CF-[id number] endoscope was introduced through the anus and advanced to the cecum, which was identified by both the appendix and ileocecal valve, without limitations.Time to cecum = 6:26 min. The quality of the prep was excellent, using MoviPrep.  The instrument was then slowly withdrawn (time = 16:19 min) as the colon was fully examined. <<PROCEDUREIMAGES>>              <<OLD IMAGES>>  FINDINGS:  Five polyps (all < 5mm) were found in the cecum, ascending, transverse, and descending (2) colon. Polyps were snared without cautery. Retrieval was successful.  Mild diverticulosis was found in the sigmoid colon.  Otherwise normal colonoscopy without other polyps, masses, vascular ectasias, or inflammatory changes.   Retroflexed views in the rectum revealed no abnormalities.    The scope was then withdrawn from the patient and the procedure completed.  COMPLICATIONS:   None ENDOSCOPIC IMPRESSION:  1) Five polyps - removed  2) Mild diverticulosis in the sigmoid colon  3) Otherwise nl colonoscopy   RECOMMENDATIONS:  1) Follow up colonoscopy in 5  years  _______________________________ Wilhemina Bonito. Eda Keys, MD  CC: Neena Rhymes MD;  The Patient    Appended Document: Colonoscopy recall 5 yrs     Procedures Next Due Date:    Colonoscopy: 06/2015

## 2010-06-25 NOTE — Letter (Signed)
Summary: Patient Notice- Polyp Results  [facility]  [address] [place], [place] [address]   Phone: [phone]  Fax: [phone]        June 16, 2010 MRN: [id number]    ANISIA LEIJA [address] [place], [place]  [address]    Dear Ms. Sharene Skeans,  I am pleased to inform you that the colon polyp(s) removed during your recent colonoscopy was (were) found to be benign (no cancer detected) upon pathologic examination.  I recommend you have a repeat colonoscopy examination in 5 years to look for recurrent polyps, as having colon polyps increases your risk for having recurrent polyps or even colon cancer in the future.  Should you develop new or worsening symptoms of abdominal pain, bowel habit changes or bleeding from the rectum or bowels, please schedule an evaluation with either your primary care physician or with me.  Additional information/recommendations:  __ No further action with gastroenterology is needed at this time. Please      follow-up with your primary care physician for your other healthcare      needs.   Please call us if you are having persistent problems or have questions about your condition that have not been fully answered at this time.  Sincerely,  Hilarie Fredrickson MD  This letter has been electronically signed by your physician.  Appended Document: Patient Notice- Polyp Results Letter mailed

## 2010-07-14 ENCOUNTER — Ambulatory Visit: Payer: BC Managed Care – PPO | Admitting: Cardiology

## 2010-07-14 ENCOUNTER — Encounter: Payer: Self-pay | Admitting: Family Medicine

## 2010-07-14 ENCOUNTER — Ambulatory Visit (INDEPENDENT_AMBULATORY_CARE_PROVIDER_SITE_OTHER): Payer: BC Managed Care – PPO | Admitting: Family Medicine

## 2010-07-14 DIAGNOSIS — M25569 Pain in unspecified knee: Secondary | ICD-10-CM | POA: Insufficient documentation

## 2010-07-22 NOTE — Assessment & Plan Note (Signed)
Summary: left knee pain/cbs   Vital Signs:  Patient profile:   60 year old female Menstrual status:  perimenopausal Height:      62 inches (157.48 cm) Weight:      217.50 pounds (98.86 kg) BMI:     39.93 Temp:     97.9 degrees F (36.61 degrees C) oral BP sitting:   114 / 60  (right arm) Cuff size:   large  Vitals Entered By: Lucious Groves CMA (July 14, 2010 10:25 AM) CC: Left knee pain./kb Is Patient Diabetic? Yes Pain Assessment Patient in pain? yes     Location: knee Intensity: 7 Type: sharp Onset of pain  Wednesday   History of Present Illness: 60 yo woman here today for L knee pain.  sxs started Wednesday while walking up stairs.  mild swelling.  first step is most painful.  no hx of similar.  pain is medial along knee cap.  no trauma or injury.  no relief w/ tylenol or ibuprofen.  minimal relief w/ hydrocodone.  Current Medications (verified): 1)  Diovan Hct 160-12.5 Mg Tabs (Valsartan-Hydrochlorothiazide) .Marland Kitchen.. 1 Tab By Mouth Daily 2)  Trilipix 135 Mg Cpdr (Choline Fenofibrate) .... Take One Tablet Daily 3)  Oxycodone-Acetaminophen 5-325 Mg Tabs (Oxycodone-Acetaminophen) .... Take 1-3 Tablet Every 6 Hours As Needed 4)  Metformin Hcl 1000 Mg Tabs (Metformin Hcl) .... Take One Tablet Two Times A Day 5)  Hydrocortisone Valerate 0.2 % Crea (Hydrocortisone Valerate) .... Apply Sparingly Every 8 Hours As Needed 6)  Novolog Flexpen 100 Unit/ml Soln (Insulin Aspart) .... 12/13/08 Units Daily 7)  Betamethasone Dipropionate 0.05 % Crea (Betamethasone Dipropionate) .... Apply Every 12 Hrs To Affected Area 8)  Lantus Solostar 100 Unit/ml Soln (Insulin Glargine) .... Inject 60 Units Daily 9)  [facility] Joseph Aspirin 81 Mg Tbec (Aspirin) .... Take One Tablet Daily 10)  Nitrofurantoin Monohyd Macro 100 Mg Caps (Nitrofurantoin Monohyd Macro) .... Take One Tablet At Bedtime 11)  Onetouch Ultra Test  Strp (Glucose Blood) .... Test Four Times Daily 12)  Onetouch Delica Lancets  Misc (Lancets)  .... Test Four Times Daily 13)  Bd Pen Needle Short U/f 31g X 8 Mm Misc (Insulin Pen Needle) .... Use Four Times Daily 14)  Klonopin 1 Mg Tabs (Clonazepam) .Marland Kitchen.. 1 Tab Nightly For Restless Leg 15)  Lipitor 20 Mg Tabs (Atorvastatin Calcium) .... Take One Tablet At Bedtime 16)  Toprol Xl 50 Mg Xr24h-Tab (Metoprolol Succinate) .... Take One Tablet Two Times A Day  Allergies (verified): No Known Drug Allergies  Review of Systems      See HPI  Physical Exam  General:  Well-developed,well-nourished,in no acute distress; alert,appropriate and cooperative throughout examination Msk:  + TTP along medial joint line of L knee, small effusion present.  pain w/ full extension, no pain w/ flexion.  no pain w/ internal or external rotation.    Impression & Recommendations:  Problem # 1:  KNEE PAIN (ICD-719.46) Assessment New small effusion of L knee along medial joint line.  no relief w/ NSAIDs, tylenol, or vicodin.  refer to ortho.  Pt expresses understanding and is in agreement w/ this plan. Her updated medication list for this problem includes:    Oxycodone-acetaminophen 5-325 Mg Tabs (Oxycodone-acetaminophen) .Marland Kitchen... Take 1-3 tablet every 6 hours as needed    [facility] Joseph Aspirin 81 Mg Tbec (Aspirin) .Marland Kitchen... Take one tablet daily  Orders: Orthopedic Referral (Ortho)  Complete Medication List: 1)  Diovan Hct 160-12.5 Mg Tabs (Valsartan-hydrochlorothiazide) .Marland Kitchen.. 1 tab by mouth daily  2)  Trilipix 135 Mg Cpdr (Choline fenofibrate) .... Take one tablet daily 3)  Oxycodone-acetaminophen 5-325 Mg Tabs (Oxycodone-acetaminophen) .... Take 1-3 tablet every 6 hours as needed 4)  Metformin Hcl 1000 Mg Tabs (Metformin hcl) .... Take one tablet two times a day 5)  Hydrocortisone Valerate 0.2 % Crea (Hydrocortisone valerate) .... Apply sparingly every 8 hours as needed 6)  Novolog Flexpen 100 Unit/ml Soln (Insulin aspart) .... 12/13/08 units daily 7)  Betamethasone Dipropionate 0.05 % Crea (Betamethasone  dipropionate) .... Apply every 12 hrs to affected area 8)  Lantus Solostar 100 Unit/ml Soln (Insulin glargine) .... Inject 60 units daily 9)  [facility] Aspirin 81 Mg Tbec (Aspirin) .... Take one tablet daily 10)  Nitrofurantoin Monohyd Macro 100 Mg Caps (Nitrofurantoin monohyd macro) .... Take one tablet at bedtime 11)  Onetouch Ultra Test Strp (Glucose blood) .... Test four times daily 12)  Onetouch Delica Lancets Misc (Lancets) .... Test four times daily 13)  Bd Pen Needle Short U/f 31g X 8 Mm Misc (Insulin pen needle) .... Use four times daily 14)  Klonopin 1 Mg Tabs (Clonazepam) .Marland Kitchen.. 1 tab nightly for restless leg 15)  Lipitor 20 Mg Tabs (Atorvastatin calcium) .... Take one tablet at bedtime 16)  Toprol Xl 50 Mg Xr24h-tab (Metoprolol succinate) .... Take one tablet two times a day  Patient Instructions: 1)  Schedule your diabetes check up for later this month or early April 2)  We'll call you with your Sports Med Appt 3)  Call with any questions or concerns 4)  Hang in there!!!   Orders Added: 1)  Orthopedic Referral [Ortho] 2)  Est. Patient Level III [[address]]

## 2010-08-12 ENCOUNTER — Other Ambulatory Visit: Payer: Self-pay | Admitting: *Deleted

## 2010-08-12 NOTE — Telephone Encounter (Signed)
Last Ov- 07/14/10- acute. Last refilled 05/08/10- 30, no refills.

## 2010-08-13 MED ORDER — CLONAZEPAM 1 MG PO TABS: ORAL_TABLET | ORAL | Status: DC

## 2010-08-13 NOTE — Telephone Encounter (Signed)
Faxed

## 2010-08-14 ENCOUNTER — Other Ambulatory Visit (INDEPENDENT_AMBULATORY_CARE_PROVIDER_SITE_OTHER): Payer: BC Managed Care – PPO

## 2010-08-14 DIAGNOSIS — E119 Type 2 diabetes mellitus without complications: Secondary | ICD-10-CM

## 2010-08-14 LAB — MICROALBUMIN / CREATININE URINE RATIO: Microalb Creat Ratio: 1.3 mg/g (ref 0.0–30.0)

## 2010-08-14 LAB — BASIC METABOLIC PANEL
BUN: 14 mg/dL (ref 6–23)
Creatinine, Ser: 1 mg/dL (ref 0.4–1.2)
GFR: 60.06 mL/min (ref >60.00)

## 2010-08-25 ENCOUNTER — Encounter: Payer: Self-pay | Admitting: Family Medicine

## 2010-08-25 ENCOUNTER — Ambulatory Visit (INDEPENDENT_AMBULATORY_CARE_PROVIDER_SITE_OTHER): Payer: BC Managed Care – PPO | Admitting: Family Medicine

## 2010-08-25 VITALS — BP 120/80 | Temp 98.6°F | Wt 217.0 lb

## 2010-08-25 DIAGNOSIS — R197 Diarrhea, unspecified: Secondary | ICD-10-CM

## 2010-08-25 DIAGNOSIS — K529 Noninfective gastroenteritis and colitis, unspecified: Secondary | ICD-10-CM | POA: Insufficient documentation

## 2010-08-25 LAB — CBC WITH DIFFERENTIAL/PLATELET
Basophils Absolute: 0.1 10*3/uL (ref 0.0–0.1)
Eosinophils Absolute: 0.4 10*3/uL (ref 0.0–0.7)
HCT: 36.3 % (ref 36.0–46.0)
Lymphs Abs: 2.8 10*3/uL (ref 0.7–4.0)
MCHC: 34.2 g/dL (ref 30.0–36.0)
MCV: 89.4 fl (ref 78.0–100.0)
Monocytes Absolute: 0.5 10*3/uL (ref 0.1–1.0)
Platelets: 214 10*3/uL (ref 150.0–400.0)
RDW: 14.2 % (ref 11.5–14.6)

## 2010-08-25 LAB — HEPATIC FUNCTION PANEL
AST: 20 U/L (ref 0–37)
Albumin: 3.8 g/dL (ref 3.5–5.2)
Alkaline Phosphatase: 52 U/L (ref 39–117)
Bilirubin, Direct: 0 mg/dL (ref 0.0–0.3)
Total Bilirubin: 0.3 mg/dL (ref 0.3–1.2)

## 2010-08-25 LAB — SEDIMENTATION RATE: Sed Rate: 9 mm/hr (ref 0–22)

## 2010-08-25 MED ORDER — CHOLESTYRAMINE 4 G PO PACK: 1.0000 | PACK | Freq: Three times a day (TID) | ORAL | Status: DC

## 2010-08-25 NOTE — Patient Instructions (Signed)
Follow up in 1 month to see if symptoms have improved Start the Cholestyramine 30 minutes before meals We'll notify you of your lab results Call with any questions or concerns If no improvement, we'll send you back to GI Hang in there!

## 2010-08-25 NOTE — Progress Notes (Signed)
  Subjective:    Patient ID: Lacey [name], female    DOB: [birth year], 60 y.o.   MRN: [id number]  HPI 'bowel issues'- constant diarrhea x1 year, 'if not longer'.  No fevers.  'some days i spend the whole day in the bathroom'.  Has already gone 4 times today.  Stools are loose.  No blood in stools.  Had colonoscopy w/ Dr Marina Goodell- had 5 polyps but otherwise normal.  S/p cholecystectomy- initially had diarrhea w/ eating but this improved.  Now sxs have returned and worsened.  Having accidents.  Having sxs that occur independently of food and also at night.  Never tested for celiac or IBD.  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  Reviewed article w/ pt.   Review of Systems For ROS see HPI     Objective:   Physical Exam  Constitutional: She appears well-developed and well-nourished. No distress.  Cardiovascular: Normal rate, regular rhythm, normal heart sounds and intact distal pulses.   Pulmonary/Chest: Effort normal and breath sounds normal. No respiratory distress.  Abdominal: She exhibits no distension. There is no tenderness. There is no rebound.          Assessment & Plan:

## 2010-08-25 NOTE — Assessment & Plan Note (Signed)
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  Pt has had recent colonoscopy- making dx of IBD or celiac dz unlikely (which article recommends ruling out).  Check labs.  Start tx w/ cholestyramine.  Will follow closely.

## 2010-08-26 ENCOUNTER — Encounter: Payer: Self-pay | Admitting: *Deleted

## 2010-09-08 ENCOUNTER — Telehealth: Payer: Self-pay | Admitting: Internal Medicine

## 2010-09-08 NOTE — Telephone Encounter (Signed)
All Cardiac faxed to [facility] @ [phone]  09/08/10/km

## 2010-09-13 ENCOUNTER — Other Ambulatory Visit: Payer: Self-pay | Admitting: Family Medicine

## 2010-09-16 NOTE — Op Note (Signed)
NAME:  Lacey [name], Lacey [name] NO.:  [id number]   MEDICAL RECORD NO.:  [id number]          PATIENT TYPE:  INP   LOCATION:  3537                         FACILITY:  [facility]   PHYSICIAN:  Cristi Loron, M.D.DATE OF BIRTH:  1950-05-22   DATE OF PROCEDURE:  10/12/2007  DATE OF DISCHARGE:                               OPERATIVE REPORT   BRIEF HISTORY:  The patient is a 60 year old white female who has  suffered from neck and arm pain consistent with a cervical  radiculopathy/myelopathy.  She failed medical management was worked up  with a cervical MRI which demonstrated the patient had spondylosis and a  large herniated disk etc at C5-6 and C6-7.  I discussed the various  treatment options with the patient including surgery.  She has weighed  the risks, benefits and alternative of surgery and decided to proceed  with C5-6 and C6-7 anterior cervical diskectomy, fusion and plating.   PREOPERATIVE DIAGNOSES:  1. C5-6 and C6-7 disk degeneration.  2. Spondylosis.  3. Earlier nuclear sclerosis.  4. Stenosis.  5. Cervical radiculopathy/myelopathy.  6. Cervicalgia.   POSTOPERATIVE DIAGNOSES:  1. C5-6 and C6-7 disk degeneration.  2. Spondylosis.  3. Earlier nuclear sclerosis.  4. Stenosis.  5. Cervical radiculopathy/myelopathy.  6. Cervicalgia.   PROCEDURE:  C5-6 and C6-7 extensive anterior cervical  diskectomy/decompression; C5-6 and C6-7 anterior interbody arthrodesis  with local morselized autograft bone and Actifuse bone graft extender;  insertion C5-6 and C6-7 interbody prosthesis (Novel PEEK interbody  prosthesis); C5-C7 anterior cervical plating with Codman SLIM-LOC  titanium plate and screws.   SURGEON:  Tressie Stalker, MD   ASSISTANT:  Trey Sailors, MD.   ANESTHESIA:  General endotracheal.   ESTIMATED BLOOD LOSS:  100 mL.   SPECIMENS:  None.   DRAINS:  None.   COMPLICATIONS:  None.   DESCRIPTION OF PROCEDURE:  The patient was brought to the operating  room  by the Anesthesia Team.  General endotracheal anesthesia was induced.  The patient remained in supine position.  A roll was placed under her  shoulders and placed her neck in slight extension.  Her anterior  cervical region was then prepared with Betadine scrub and Betadine  solution.  Sterile drapes were applied.  I then injected the area to be  incised with Marcaine with epinephrine solution.  I used a scalpel to  make a transverse incision in the patient's left anterior neck.  I used  the Metzenbaum scissors to divide the platysma muscle and then to  dissect medial to the sternocleidomastoid muscle, jugular vein and  carotid artery.  I carefully dissected down towards the anterior  cervical spine identifying the esophagus and retracting it medially.  I  then used skin swabs to clear the soft tissue from the anterior cervical  spine and then inserted a bent spinal needle into the upper exposed  intervertebral disk space.  We obtained intraoperative radiograph to  confirm our location.   We then used electrocautery to detach the medial border of the longus  colli muscle bilaterally from the C5-6 and C6-C7 intervertebral disk  space.  We then inserted the Caspar self-retaining retractor underneath  the longus colli muscle bilaterally to provide exposure.  We then began  our decompression at C6-7.  We incised the C6-7 intervertebral disk with  a 15 blade scalpel and performed a partial intervertebral diskectomy  using the pituitary forceps and Carlens curettes.  We then inserted the  distraction screws at C6-7, distracted the interspace and then used the  high-speed drill to decorticate the vertebral endplates at C6-6, drilled  away the remainder of C6-7 intervertebral disk, drilled away some  posterior spondylosis and to thin out the posterior ligament.  We then  incised the ligament with an arachnoid knife and removed it with a  Kerrison punch undercutting the vertebral endplates  decompressing the  thecal sac.  We then performed a foraminotomy about the C7 nerve root  completing the  decompression at this level.   We then repeated this procedure in an obvious fashion at C5-6  decompressing the thecal sac and bilateral C6 nerve roots.  This  completed the decompression.   We now turned our attention to arthrodesis to C5-6 and C6-7.  We used  trial spacers and determined to use a 6-mm medium spacer at C6-7 and a 5-  mm medium spacer at C5-6.  We prefilled the prosthesis with a  combination of local autograft bone that we obtained during the  decompression as well as Actifuse bone graft extender.  We then inserted  the prosthesis into distracted C5-6 and C6-7 intervertebral disk spaces.  We removed the distraction screws.  There was a good fit of the  prosthesis at both levels.   We now turned our attention to anterior spinal instrumentation.  We used  a high-speed drill to remove some ventral spondylosis from the C5-6 and  C6-7 vertebral endplates so that the plate would lay down flat.  We  selected appropriate length of Codman SLIM-DOC anterior cervical plate  and hold it along the anterior aspect of the vertebral bodies from C5-  C7.  We then drilled two 12 mm hole in C5, C6 and C7.  We then secured  the plate to the vertebral bodies by placing two 12 mm self-tapping  screws at C5, C6 and C7. We then obtained intraoperative radiograph.  There was limited visualization of the construct because of the  patient's shoulders but it looked good in vivo.  We therefore secured  the screws to the plate by locking each cam.   We then obtained hemostasis using bipolar electrocautery.  We irrigated  the wound out with bacitracin solution.  We then removed the retractor.  We inspected the esophagus for any damage; there was none apparent.  We  then reapproximated the patient's platysma muscle with interrupted 3-0  Vicryl suture.  The subcutaneous tissues with interrupted  3-0 Vicryl  suture and the skin with Steri-Strips and Benzoin.  The wound was then  coated with bacitracin  ointment and sterile dressing applied.  The drapes were removed and the  patient was subsequently extubated by the Anesthesia Team and  transported to the Postanesthesia Care Unit in stable condition.  All  sponge, instrument and needle counts were correct in this case.      Cristi Loron, M.D.  Electronically Signed     JDJ/MEDQ  D:  10/12/2007  T:  10/13/2007  Job:  [id number]

## 2010-09-16 NOTE — Op Note (Signed)
NAME:  Lacey [name], JOB NO.:  [id number]   MEDICAL RECORD NO.:  [id number]          PATIENT TYPE:  AMB   LOCATION:  NESC                         FACILITY:  [facility]   PHYSICIAN:  Excell Seltzer. Annabell Howells, M.D.    DATE OF BIRTH:  [birth year]   DATE OF PROCEDURE:  02/24/2008  DATE OF DISCHARGE:                               OPERATIVE REPORT   PROCEDURE:  Cystoscopy, left retrograde pyelogram with interpretation  and insertion of left double-J stent.   PREOPERATIVE DIAGNOSIS:  Left distal ureteral stone.   POSTOPERATIVE DIAGNOSIS:  Left distal ureteral stone with urosepsis.   SURGEON:  Excell Seltzer. Annabell Howells, M.D.   ANESTHESIA:  General.   FINDINGS:  Pyonephrosis on the left with an obstructing left distal  stone.   SPECIMEN:  Urine cultured from bladder and left kidney.   DRAINS:  6 French x 24 cm double-J stent.   COMPLICATIONS:  None.   INDICATIONS:  Ms. Dan Humphreys is a 60 year old white female patient of Dr.  Vonita Moss with a history of stones who presented earlier this week with  an obstructing left distal stone.  She has had progressive symptoms and  was seen last night in the office where she was afebrile but had  significant pain that was relieved by Toradol.  She was added on today  for ureteroscopy.  However, she was found to have a temperature of 101.7  preoperatively today.   FINDINGS OF PROCEDURE:  The patient was given Cipro.  She was taken to  the operating room where general anesthetic was induced.  She was placed  in lithotomy position.  Her perineum and genitalia were prepped with  Betadine solution.  She was draped in the usual sterile fashion.  Cystoscopy was performed using a 22 [place] scope and 12 and 70 degree  lenses.  Examination revealed mild trabeculation with diffuse follicular  cystitis changes.  The ureteral orifices were unremarkable.  The urethra  was unremarkable.   The left ureteral orifice was initially cannulated with a guidewire but  the stone  could not be easily bypassed.  A 5 open-end catheter was then  passed per the left ureteral orifice and contrast was instilled.   The retrograde pyelogram demonstrated no false passage or extravasation  but there was an impacted stone in the distal ureter.  Contrast did go  by with some pressure.   I then passed a sensor guidewire and was able to negotiate it by the  stone.  The opening catheter was advanced over the wire to the kidney.  The wire was removed and a urine culture was obtained from the kidney.  Previously a urine culture had been obtained from the bladder.  Once the  specimen was obtained the wire was replaced.  The open-end catheter was  removed and a 6 French 24 cm double-J stent was placed under  fluoroscopic guidance without  difficulty.  The wire was removed leaving a good coil in the kidney and  a good coil in the bladder.  The bladder was drained, cystoscope was  removed.  A B and O suppository  was placed.  The patient was taken down  from the lithotomy position and removed to recovery in stable condition.  She will be admitted to the hospital for urosepsis.      Excell Seltzer. Annabell Howells, M.D.  Electronically Signed     JJW/MEDQ  D:  02/24/2008  T:  02/24/2008  Job:  [id number]

## 2010-09-16 NOTE — H&P (Signed)
NAME:  Lacey [name], Lacey [name] NO.:  [id number]   MEDICAL RECORD NO.:  [id number]          PATIENT TYPE:  INP   LOCATION:  1406                         FACILITY:  [facility]   PHYSICIAN:  Excell Seltzer. Annabell Howells, M.D.    DATE OF BIRTH:  1951/01/22   DATE OF ADMISSION:  02/24/2008  DATE OF DISCHARGE:                              HISTORY & PHYSICAL   CHIEF COMPLAINT:  Fever.   HISTORY:  Lacey [name] is a 60 year old white female who came to the  office last night with left flank pain.  She has a known distal ureteral  stone.  She had no fever last night and elected to have the ureteroscopy  scheduled for the following day.  She had increased symptoms through the  night and when she came to the operating room on Friday morning, had a  temperature to 101.7.  At the time of the procedure, a decision was made  to only place the stent for presumed urosepsis.  During the procedure,  she was noted to have significant turbid urine above an obstructing  stone in the distal ureter.  Cultures were sent from the renal pelvis  and bladder.   PAST MEDICAL HISTORY:  1. Reflux.  2. Arthritis.  3. Diabetes.  4. High cholesterol.  5. Hypertension.   SURGICAL HISTORY:  1. Hysterectomy.  2. Cholecystectomy.  3. She had a ureteroscopy for a prior stone.  4. Surgical diskectomy.   ALLERGIES:  No drug allergies.   MEDICATIONS:  1. Lisinopril 20 mg 1/2 tab q.a.m.  2. Lantus 50 two units in the evening.  3. Triplex 135 mg q.a.m.  4. Metformin 1000 mg b.i.d.  5. Piroxicam 20 mg q.a.m.  6. Simvastatin 40 mg q.a.m.  7. Cimetidine 80 mg  nightly.  8. Clonidine 0.1 mg b.i.d.  9. Vitamin D 1000 units b.i.d.  10.Magnesium 250 mg q.a.m.  11.Fish oil 10,000 units b.i.d.  12.Oxycodone 2 tablets every 3-4 hours as needed for pain.   FAMILY HISTORY:  Pertinent for heart disease.   SOCIAL HISTORY:  Negative for alcohol.  She has been a pack-per-day  smoker for 30 years.   PHYSICAL EXAMINATION:  She is  flushed and febrile with left flank pain  but is otherwise entirely without complaints.  Blood pressure 159/78, temp 98.1, heart rate 96 the night before  admission.  Today, temp is 102.9, heart rate 106, blood pressure 133/79.  GENERAL:  She is a well-developed and well-nourished white female in no  acute distress.  Head and face normocephalic and atraumatic.  LUNGS:  Normal effort.  HEART:  Tachy arrhythmia.  ABDOMEN:  Soft, moderately obese with left CVA tenderness.  GU:  Normal external genitalia.  The vaginal mucosa looks a little  atrophied.  The urethral meatus is unremarkable.  She has no cystocele.  EXTREMITIES:  Full range of motion without edema.  NEUROLOGIC:  She is grossly intact.   IMPRESSION:  Urosepsis with an obstructing left ureteral stone.   PLAN:  She will be admitted to the hospital for IV antibiotics after  recovering from her procedure  today.      Excell Seltzer. Annabell Howells, M.D.  Electronically Signed     JJW/MEDQ  D:  02/24/2008  T:  02/24/2008  Job:  [id number]   cc:   Maretta Bees. Vonita Moss, M.D.  Fax: [phone]

## 2010-09-16 NOTE — Op Note (Signed)
NAME:  Lacey [name], Lacey [name] NO.:  [id number]   MEDICAL RECORD NO.:  [id number]          PATIENT TYPE:  AMB   LOCATION:  NESC                         [facility]ACILITY:  [facility]   PHYSICIAN:  Maretta Bees. Vonita Moss, M.D.DATE OF BIRTH:  10/31/1950   DATE OF PROCEDURE:  02/28/2008  DATE OF DISCHARGE:                               OPERATIVE REPORT   PREOPERATIVE DIAGNOSES:  Left ureteral stone and left pyelonephritis.   POSTOPERATIVE DIAGNOSES:  Left ureteral stone and left pyelonephritis.   PROCEDURE:  Cystoscopy, left ureteroscopy and fragmentation of stone  with laser and stone basketing of ureteral calculi, left retrograde  pyelogram with interpretation and insertion of left double-J catheter.   SURGEON:  Maretta Bees. Vonita Moss, M.D.   ANESTHESIA:  General.   INDICATIONS:  This lady with a long history of stone disease had the  onset last week of severe left flank pain due to a 5 mm stone in the  distal left ureter.  She then developed severe pain and actually then  developed some fever and had to have cysto and double-J catheter placed  on February 24, 2008.  She was kept overnight in the hospital and has  been afebrile over the weekend and comes in now for removal of this  stone.   PROCEDURE:  The patient was brought to the operating room and placed in  lithotomy position.  External genitalia were prepped and draped in usual  fashion.  She was cystoscoped and the double-J catheter was seen  protruding out the left UO into the bladder.  A grasper was used to pull  out the stent per urethra and a guidewire with the tip softened was  passed up through the double-J catheter and passed a stone in the renal  collecting system.  With the guidewire in place I then inserted a 6-  [place] rigid ureteroscope and found the stone which was fairly smooth  and dark yellow in color and I was able to grasp it with a nitinol stone  basket just below the pelvic brim.  The stone would not come through  the  intramural ureter easily.  The stone could not easily be released from  the basket so I cut off the basket wire and left it intact and then  reinserted the ureteroscope with the holmium laser and fragmented the  stone into six or seven pieces.  I then freed that initial stone basket  and I used another nitinol stone basket to retrieve and remove all of  the stones from the ureter and drop them off into the bladder.  I then  repeated the ureteroscopy and there were no residual stones in the  ureter and the ureter was intact with no sign of perforation or any  significant injury.   With the guidewire in place I then inserted the inner sheath of a  ureteral dilating access sheath which had been used earlier and then  injected contrast with the wire removed and the ureter was intact with  no evidence of extravasation or significant narrowing.  However, the  renal pelvis looked like it  had edema or perhaps blood as a filling  defect and therefore I thought a double-J catheter should be left.  Therefore, I reinserted the cystoscope and placed a guidewire  up into the renal pelvis and placed a 5-French, 26-cm Polaris stent with  a full coil in the renal pelvis and the loops coming into the bladder.  With the stent in good position the bladder was emptied and the scope  removed and the patient was sent to the recovery room in good condition  having tolerated the procedure well.      Maretta Bees. Vonita Moss, M.D.  Electronically Signed     LJP/MEDQ  D:  02/28/2008  T:  02/28/2008  Job:  [id number]

## 2010-09-19 NOTE — Discharge Summary (Signed)
   NAME:  Lacey [name], Lacey [name] NO.:  [id number]   MEDICAL RECORD NO.:  [id number]                   PATIENT TYPE:  INP   LOCATION:  0375                                 FACILITY:  [facility]   PHYSICIAN:  Maretta Bees. Vonita Moss, M.D.             DATE OF BIRTH:  [birth year]   DATE OF ADMISSION:  09/02/2002  DATE OF DISCHARGE:  09/06/2002                                 DISCHARGE SUMMARY   FINAL DIAGNOSES:  1. Urosepsis.  2. Left ureteral calculus.  3. Left hydronephrosis.  4. Glucose intolerance.  5. Dyspepsia.  6. Oral candidiasis.   PROCEDURE:  1. Cystoscopy.  2. Left double J catheter placement Sep 02, 2002.   HISTORY:  This 60 year old white female with long history of urinary tract  stone disease was admitted with two to three days of pain and fever.  She  had a 4 x 8 mm stone distally up the ureter on CT with hydronephrosis and  she also had gas in her collecting system and she subsequently grew out E.  coli that was pan sensitive in the urine and the blood.  Double J catheter  was placed and she was admitted for further therapy.  Remainder of physical  examination was decreased breath sounds at the bases and some mild guarding  in the left CVA region.  After admission with double J catheter in place her  fever came down slowly.  She was treated with intravenous gentamicin and  Ancef and metronidazole.  She voided satisfactorily.  She resumed a diet  satisfactorily.  Her vital signs remained stable.  Her blood sugars came  down from over 200 to 155.  She was ready for discharge on Sep 06, 2002.  She  was sent home in improved condition.  She will resume a low glucose and  sugar diet.  She will go on activity as tolerated.  She can use Prevacid as  needed.  She was sent home on 250 mg of cefalexin q.i.d. and also was given  a prescription for magic mouthwash for oral candidiasis.  She is to call our  office to coordinate her cystoscopy, ureteroscopy, and stone  manipulation  next Wednesday, May 12.  She will take her temperature at home and let us  know if the fever does not resolve.                                               Maretta Bees. Vonita Moss, M.D.    LJP/MEDQ  D:  09/06/2002  T:  09/06/2002  Job:  [id number]

## 2010-09-19 NOTE — Op Note (Signed)
[place]  Patient:    Lacey [name], Lacey [name]                        MRN: [id number] Proc. Date: 12/29/99 Adm. Date:  [id number] Attending:  Tempie Donning CC:         Vale Haven. Andrey Campanile, M.D.                           Operative Report  PREOPERATIVE DIAGNOSIS:  Cholecystitis.  POSTOPERATIVE DIAGNOSIS:  Cholecystitis.  OPERATION PERFORMED:  Laparoscopic cholecystectomy..  SURGEON:  Gita Kudo, M.D.  ASSISTANT:  Donnie Coffin. Samuella Cota, M.D.  ANESTHESIA:  General endotracheal.  INDICATIONS FOR PROCEDURE:  The patient is a 60 year old female with bouts of acute right upper quadrant abdominal pain.  Recent evaluation in the emergency room showed gallstones.  Of note is the fact that she has had open bariatric surgery with a stomach stapling a few years ago in [place].  OPERATIVE FINDINGS:  There were multiple adhesions but we were able to maneuver through them without having to open the patient.  The gallbladder had a few filmy adhesions on it and was not acutely inflamed.  The cystic duct was quite small and although I attempted to cannulate it, I was unsuccessful to perform cholangiogram.  DESCRIPTION OF PROCEDURE:  Under satisfactory general endotracheal anesthesia, having received 1.0 gm Ancef, the patients abdomen was prepped and draped in a standard fashion.  A transverse incision was made beneath the umbilicus and the midline opened carefully into the peritoneum.  Finger dissection was used to develop a plane and then the camera was inserted to show that we were in good position and CO2 insufflation performed.  Under direct vision, another #10 port was placed medially and two #5 ports laterally.  Operating through the upper medial port, I carefully took out adhesions.  Graspers in the lateral port gave excellent exposure. The cystic duct and cystic artery were each identified carefully and circumferentially dissected with a right  ankle clamp.  When certain of the anatomy, the cystic artery was divided between multiple metal clips.  The cystic duct was clamped close to the gallbladder and an incision made in it.  Then a cholangiogram catheter was let in a separate stab wound and attempts at cannulating the duct were unsuccessful because of the small size of the duct.  The catheter was then withdrawn and the duct controlled with three metal staples and then transected.  Then the gallbladder was removed from below upwards using the coagulating spatula for both hemostasis and dissection. Before severing the gallbladder, the liver bed was checked for hemostasis which was good.  Because of a small pinpoint hole in the gallbladder and a little bit of bile spillage, an Endo catch bag was placed through the upper port and then the gallbladder removed without complication through the umbilical port after the camera was moved.  Then the entire abdomen was lavaged with saline and suctioned dry.  The operative site was hemostatic.  Following this, all CO2 and port was released under direct vision and then the midline closed with previous suture and a second interrupted 0 Vicryl suture.  All sites had been infiltrated with Marcaine and then the subcutaneous tissues approximated  with 4-0 Vicryl and skin with Steri-Strips.  Sterile absorbent dressings were then applied.  The patient went to the recovery room from the operating room in good condition  without complication. DD:  12/29/99 TD:  12/29/99 Job: [id number]

## 2010-09-19 NOTE — Op Note (Signed)
   NAME:  Lacey [name], Lacey [name] NO.:  [id number]   MEDICAL RECORD NO.:  [id number]                   PATIENT TYPE:  INP   LOCATION:  0375                                 FACILITY:  [facility]   PHYSICIAN:  Bertram Millard. Dahlstedt, M.D.          DATE OF BIRTH:  [birth year]   DATE OF PROCEDURE:  09/02/2002  DATE OF DISCHARGE:                                 OPERATIVE REPORT   PREOPERATIVE DIAGNOSES:  1. Left ureteral stone.  2. Left hydronephrosis.  3. Pyelonephritis.   POSTOPERATIVE DIAGNOSES:  1. Left ureteral stone.  2. Left hydronephrosis.  3. Pyelonephritis.   PROCEDURES:  1. Cystoscopy.  2. Left double J stent placement.   SURGEON:  Bertram Millard. Dahlstedt, M.D.   ANESTHESIA:  General endotracheal.   COMPLICATIONS:  None.   BRIEF HISTORY:  A 60 year old female with pyonephrosis.  She has had kidney  stone for the past several days and presented to the emergency room with  worsened pain, fever and chills.  She has been noted to have some  hypotension and tachycardia.  She has gas-forming organisms causing  pyelonephritis.  It was recommended that she undergo emergent stent  placement.  She presents at that time for this procedure.   DESCRIPTION OF PROCEDURE:  The patient was administered preoperative IV  antibiotics.  The patient was taken to the operating room, where a general  endotracheal anesthetic was administered.  She was placed in the dorsal  lithotomy position.  Genitalia and perineum were prepped and draped.  A  cystoscope was placed in the bladder, which was seen to be normal.  The left  ureter was cannulated with a guidewire and over top of this, using  fluoroscopic guidance, a 24 cm x 7 [place] double J stent was placed.  Good  curls were seen proximally using fl uroscopy and distally using cystoscopy.  The bladder was drained, the procedure terminated.  The scope was removed.   The patient tolerated the procedure well.  She was awakened and  taken to the  PACU in stable condition.                                               Bertram Millard. Dahlstedt, M.D.    SMD/MEDQ  D:  09/02/2002  T:  09/04/2002  Job:  [id number]

## 2010-09-19 NOTE — H&P (Signed)
[place]. [facility]  Patient:    MADISIN, HASAN                        MRN: [id number] Adm. Date:  [id number] Attending:  Drema Halon                         History and Physical  HISTORY:  This is a 60 year old white female admitted through the emergency room for evaluation and treatment of abdominal pain, nausea, vomiting, history of pyelonephritis.  Patient was in her usual state of health until this weekend, when she complained of nausea for the whole weekend.  On the night of admission, she developed upper abdominal cramping pain with nausea, vomiting, fever, and chills, and came to the emergency room for further evaluation.  In the emergency room her white blood count was noted to be 22,000.  Urinalysis showed 20-50 white blood cells with many bacteria.  Because of her abdominal pain, CT scan of the abdomen was ordered which did show dilated loops of small bowel and she was admitted for further evaluation.  PAST MEDICAL HISTORY:  Significant for adult onset diabetes, diet controlled. Also, she has had stomach stapling done several years ago, as well as recent cholecystectomy.  She does not smoke.  ALLERGIES:  None.  MEDICATIONS:  She is not on any medications.  SOCIAL HISTORY:  She is married and does have two children.  PHYSICAL EXAMINATION:  GENERAL:  Shows an obese female complaining of abdominal pain and nausea.  HEENT:  TMs are normal.  Fundi not seen.  Throat is clear.  NECK:  Supple without adenopathy or thyromegaly.  CARDIAC:  Shows a regular rhythm without murmurs or gallops.  LUNGS:  Clear to auscultation.  ABDOMEN:  Shows decreased bowel sounds with diffuse tenderness, no true rebound.  Questionable flank tenderness.  PELVIC AND RECTAL:  Deferred.  EXTREMITIES:  Show 2+ pulses and reflexes.  ASSESSMENT: 1. Pyelonephritis with abdominal pain. 2. History of diabetes. 3. History of ______ and gallbladder  surgery.  PLAN:  To be admitted for intravenous antibiotics, pain control, and further evaluation of abdominal pain. DD:  05/24/00 TD:  05/24/00 Job: [id number]

## 2010-09-19 NOTE — Op Note (Signed)
NAME:  ADAORA, MCHANEY NO.:  [id number]   MEDICAL RECORD NO.:  [id number]                   PATIENT TYPE:  AMB   LOCATION:  [facility]                                 FACILITY:  [facility]   PHYSICIAN:  Maretta Bees. Vonita Moss, M.D.             DATE OF BIRTH:  [birth year]   DATE OF PROCEDURE:  09/13/2002  DATE OF DISCHARGE:                                 OPERATIVE REPORT   PREOPERATIVE DIAGNOSIS:  Left ureteral calculus.  Hydronephrosis and  pyelonephritis.   POSTOPERATIVE DIAGNOSIS:  Left ureteral calculus.  Hydronephrosis and  pyelonephritis.   PROCEDURE:  Cystoscopy.  Left ureteroscopy.  Stone basketing and left  retrograde pyelogram with interpretation.   SURGEON:  Maretta Bees. Vonita Moss, M.D.   ANESTHESIA:  General.   INDICATIONS:  This 60 year old lady has a long history of urinary tract  stone disease and UTIs.  She was hospitalized  at [facility] on  09/02/2002 when she had left flank pain and fever and a 4 x 8-mm stone in  the distal left ureter and gas in the collecting system and a double-J  catheter was placed by  Dr. Retta Diones and I followed up on her in the hospital where she was treated  with gentamicin and Ancef and went home on cephalexin, afebrile and feeling  well and a double-J catheter in place.  She comes in today for stone  manipulation and removal.   PROCEDURE:  The patient was brought to the operating room and placed in  lithotomy position.  External genitalia were prepped and draped in the usual  fashion.  She was cystoscoped and double-J catheter was noted in place  coming out the left UO.  A grasper was used to remove the double-J and a  guide wire placed up through the double J catheter without difficulty.  She  was then ureteroscoped with the short 6 French ureteroscope and the stone  was located approximately 2 cm up the ureter.  A Nitinol stone basket was  used to grasp the stone and remove it atraumatically and  subsequently it was  given to the patient's husband.  I then did a retrograde pyelogram and it  showed some hydroureteronephrosis but I felt that it was secondary to the  stent and infection.  At this point I saw no residual stones as I backed out  the ureter with the ureteroscope and felt that it was appropriate to leave  her double-J catheter out at this point.  A B&O suppository and 30 mg of  Toradol IV were given.  She was taken to the recovery room in good condition  having tolerated the procedure well.  Maretta Bees. Vonita Moss, M.D.    LJP/MEDQ  D:  09/13/2002  T:  09/13/2002  Job:  [id number]

## 2010-09-19 NOTE — Discharge Summary (Signed)
[place]. [facility]  Patient:    Lacey [name], Lacey [name]                        MRN: [id number] Adm. Date:  [id number] Disc. Date: [id number] Attending:  Nelta Numbers Dictator:   Lavella Hammock, P.A. CC:         Cecil Cranker, M.D. [facility]             Stanley C. Andrey Campanile, M.D.                           Discharge Summary  PROCEDURE:  Adenosine Cardiolite.  HOSPITAL COURSE:  Ms. Peeler is a 60 year old female patient of Stanley C. Wilson, M.D. with newly diagnosed diabetes who has a history of tobacco use for 25 years and strong family history of coronary artery disease.  She complained of chest heaviness associated with shortness of breath and dizziness and was referred to cardiology for admission.  She was admitted to rule out MI for further evaluation.  The next day her enzymes were negative for MI and she was scheduled for an Adenosine Cardiolite.  She had an Adenosine Cardiolite and the images showed no scar, no ischemia, and an EF of 67%.  Her chest pain had resolved and there were no further episodes.  Because of this, she was considered stable for discharge on December 03, 1999, p.m.  CONDITION ON DISCHARGE:  Stable.  CONSULTING PHYSICIANS:  None.  COMPLICATIONS:  None.  DISCHARGE DIAGNOSES: 1. Chest pain, therapy is to follow up with primary care M.D. 2. Postmenopausal. 3. Increased liver function tests. 4. Cholelithiasis on gallbladder ultrasound. 5. Diet-controlled diabetes. 6. Family history of coronary artery disease. 7. History of ongoing tobacco use.  DISCHARGE INSTRUCTIONS: 1. Her activity level is to be as tolerated. 2. She is to stick to a low fat diet. 3. She is not to smoke. 4. She is to follow up with Duffy Rhody C. Andrey Campanile, M.D. and call for an appointment. 5. She is to follow up with E. Graceann Congress, M.D. [facility] p.r.n.  DISCHARGE MEDICATIONS: 1. Prevacid 30 mg p.o. q.d. 2. Premarin DD:  12/03/99 TD:  12/03/99 Job:  [id number]

## 2010-09-19 NOTE — Discharge Summary (Signed)
[place]. [facility]  Patient:    Lacey [name], Lacey [name]                        MRN: [id number] Adm. Date:  [id number] Disc. Date: [id number] Attending:  Lauree Chandler                           Discharge Summary  PRESENTING HISTORY:  [name] was admitted with a 24 hour history of nausea, vomiting, abdominal and back discomfort.  She was seen in the emergency room with a CT scan which was nondelineating other than an ileus but she had grossly abnormal urine with white cells and bacteria and it was felt that this was pyelonephritis and she was admitted by Ronnald Nian, M.D. Admission history and physical will not be repeated as this is not returned as yet.  The pertinent positives to her past medical history were that she is a diabetic, controlled by diet, and has had a cholecystectomy several months before.  HOSPITAL COURSE:  The patient was admitted and placed on IV fluids.  Her sugars were monitored, which were good. She was seen in consultation both by Gita Kudo, M.D., and Barron Alvine, M.D., covering for Maretta Bees. Vonita Moss, M.D., from a urology standpoint.  Her white count on admission was 22,000.  Her abdominal examination showed some mild generalized tenderness without rebound and without significant distention.  There were no masses or bruits palpable and she had guaiac negative stool.  Surgical consultation felt that there was no sign of an acute abdomen or small-bowel obstruction.  She had no further vomiting after hospitalization and took liquids 24 hours later. Sugar stayed good throughout her hospital course.  A culture was suppose to have been done in the ER but this was not able to be located.  She had no fever for 48 hours and was taking liquids and a soft diet by the day of discharge.  Urology felt that this, though confusing, was compatible with pyelonephritis. Her scan did show that she had some intrarenal nephrolithiasis which  will be dealt with at a later date. She felt much improved by the day of discharge. Her abdomen was soft. There was no rebound. There was no significant tenderness.  Her back was substantially improved.  There was no rash or other complicating feature.  DISCHARGE DIAGNOSES: 1. Pyelonephritis. 2. Type 2 diabetes, diet controlled. 3. Nephrolithiasis.  DISCHARGE MEDICATION:  Cipro 500 mg b.i.d. x 1 week.  COMPLICATIONS:  None.  CONDITION ON DISCHARGE:  Improved.  FOLLOW-UP:  She will follow up in the office in 48 hours. DD:  05/26/00 TD:  05/27/00 Job: [id number]

## 2010-09-19 NOTE — Consult Note (Signed)
[place]. [facility]  Patient:    NAYLEEN, JANOSIK                          MRN: [id number] Proc. Date: 05/25/00 Attending:  Lucrezia Starch. Ovidio Hanger, M.D. CC:         Vale Haven. Andrey Campanile, M.D.  Gita Kudo, M.D.   Consultation Report  HISTORY OF PRESENT ILLNESS:  Ms. Puzio is a lovely, 60 year old white female who is very well known to Dr. Vonita Moss.  He sees her approximately every six months and has for many years.  She has experienced periodic urinary tract infections and several cases of cystitis, and has undergone diagnostic work ups in the past.  In 1986, a CT scan revealed left renal atrophic changes with pyelocaliectasis and some calcification and subsequent IVP in 1987 revealed no obstruction and really an unchanged situation.  She now presents with severe abdominal pain, nausea and vomiting which was sudden onset.  Urinalysis on admission was grossly infected and she had a significant leukocytosis with a left shift.  She is subsequently being treated with IV antibiotics and has had significant resolution of symptoms.  A CT scan reveals an atrophic left kidney with nonobstructive stones without significant changes from prior x-rays, with exception of possible increase in size of some of the stones, but there appears to be no obstruction from them.  She notes no significant urgency, frequency, dysuria, and notes no hematuria.  ALLERGIES:  She has no known allergies.  MEDICATIONS:  None prior to admission.  PAST MEDICAL HISTORY:  Illnesses:  She has non-insulin dependent diabetes mellitus, hypertension.  She is status post a laparoscopic cholecystectomy, August 1 by Dr. Maryagnes Amos.  She had a total abdominal hysterectomy in 1995 and had a stomach stapling in the 1980s.  The diabetes is diet controlled.  SOCIAL HISTORY:  She is a negative smoker, negative drinker.  FAMILY HISTORY:  Non pertinent.  REVIEW OF SYSTEMS:  As above, with the severe pain.  She  notes that she primarily has abdominal pain, although possibly some left flank pain.  She also had severe nausea, vomiting, sweats, and chills.  PHYSICAL EXAMINATION:  VITAL SIGNS:  She is afebrile, vital signs stable.  GENERAL:  She is slightly obese.  In no acute distress.  Oriented x 3.  HEENT:  Pearly.  Ear, nose and throat clear.  NECK:  Without mass or thyromegaly.  CHEST:  Clear anteriorly and posteriorly without rales or rhonchi.  HEART:  Normal sinus rhythm without murmurs or gallops.  ABDOMEN:  Slightly distended but totally nontender at this point and really no flank tenderness is noted.  PELVIC:  Examination was deferred.  EXTREMITIES:  Normal.  NEUROLOGICAL:  Appears to be intact.  SKIN:  Appears to be normal.  PERTINENT LABORATORY EVALUATION:  Urine is grossly infection as noted; the culture is pending.  The hemoglobin is 15.7, hematocrit 45.9, white blood cell count is 22.3.  BMET noted to have a BUN and creatinine 14/0.9 and a sugar of 188.  Pertinent x-rays with a cast, again as above.  IMPRESSION:  Pyelonephritis is a possibility, but the symptoms of severe abdominal pain with leukocytosis without irritative voiding symptoms is confusing.  Her computerized axial tomography reveals chronic problems only in the urinary tract and she has had periodic cystitis.  PLAN:  I agree with the current treatment Cipro.  Would augment based on sensitivities and certainly she will need to be  on oral antibiotics.  Would recommend this after she has been afebrile for 48 hours.  Dr. Vonita Moss will follow up and make further recommendations. DD:  05/25/00 TD:  05/26/00 Job: [id number]

## 2010-09-19 NOTE — H&P (Signed)
NAME:  Lacey [name], Lacey [name] NO.:  [id number]   MEDICAL RECORD NO.:  [id number]                   PATIENT TYPE:  INP   LOCATION:  0156                                 FACILITY:  [facility]   PHYSICIAN:  Bertram Millard. Dahlstedt, M.D.          DATE OF BIRTH:  1951/02/15   DATE OF ADMISSION:  09/02/2002  DATE OF DISCHARGE:                                HISTORY & PHYSICAL   REASON FOR ADMISSION:  Kidney stone, sepsis.   BRIEF HISTORY:  A 59 year old female with two prior kidney stones treated by  Dr. Larey Dresser with lithotripsy.  She began having left flank pain 2-3  days ago.  This pain has progressed.  She has developed burning with  urination and frequency.  She called me earlier today and stated her  symptoms, as well as stating that she felt like she was having a fever.  I  told her to present to the emergency room.  While in the emergency room she  had a CT urogram that showed an obstructing left ureteral calculus 4 x 8 mm  in size.  Complicating the fact was that she had left hydroureteronephrosis  and gas in her left renal collecting system.   As the patient has developed a fever while she has been in the emergency  room, and she has gas forming organisms most likely causing an infection, it  was recommended that she have emergent placement of a double-J stent.  Risks  and complications of the procedure have been discussed with the patient and  her family.  This is to be followed in several weeks by a stone extraction.  She is admitted emergently for this procedure.   PAST MEDICAL HISTORY:  1. History of borderline diabetes, -- the patient not on any medications     for this.  2. Status post cholecystectomy.  3. Status post partial hysterectomy.   MEDICATIONS:  None.   ALLERGIES:  None.   SOCIAL HISTORY:  Married, children.  She occasionally smokes.   FAMILY HISTORY:  Noncontributory.   REVIEW OF SYSTEMS:  Noncontributory.   PHYSICAL  EXAMINATION:  GENERAL:  Revealed an obese middle-aged female.  She  appeared somewhat toxic.  HEENT:  Normal.  CHEST:  Clear with some decreased breath sounds at the bases.  HEART:  Increased rate, but normal rhythm.  ABDOMEN:  Protuberant, soft, nondistended with mild left CVA and left lower  quadrant tenderness.  No hepatosplenomegaly or masses.  No rebound or  guarding.  GENITALIA:  (Examined in the operating room), were normal.  EXTREMITIES:  Without cyanosis, clubbing or edema.   ADMISSION STUDIES:  At the time of this dictation studies were pending.  EKG  did show sinus tachycardia, otherwise normal.   IMPRESSION:  1. Left ureteral stone.  2. Left hydronephrosis.  3. Pyelonephritis.   PLAN:  1. I have recommended emergent cystoscopy and double-J stent placement.  We  will take her to the operating room.  2. Cover with broad-spectrum antibiotics.  3. The patient will need step-down bed postoperatively.                                               Bertram Millard. Retta Diones, M.D.    SMD/MEDQ  D:  09/02/2002  T:  09/02/2002  Job:  [id number]

## 2010-09-19 NOTE — Cardiovascular Report (Signed)
Lacey [name], Lacey [name] NO.:  [id number]   MEDICAL RECORD NO.:  [id number]          PATIENT TYPE:  OIB   LOCATION:  6501                         FACILITY:  [facility]   PHYSICIAN:  W. Ashley Royalty., M.D.DATE OF BIRTH:  [birth year]   DATE OF PROCEDURE:  07/10/2004  DATE OF DISCHARGE:                              CARDIAC CATHETERIZATION   INDICATIONS:  Chest pain. Cardiolite test done recently showed no ischemia,  but she has continued to have ongoing chest pain and has multiple risk  factors.  Catheterization is done to exclude significant coronary artery  disease.   COMMENTS ABOUT PROCEDURE:  The patient tolerated this procedure well,  without complications, and had good hemostasis and peripheral pulses noted  at the end of the procedure.   HEMODYNAMIC DATA:  Aorta post contrast 124/60, LV post contrast 124/7-14.   ANGIOGRAPHIC DATA:  Left ventriculogram:  Performed in the 30-degree RAO  projection.  The aortic valve was normal.  The mitral valve was normal.  The  left ventricle appears normal in size.  Estimated ejection fraction is 55%.  Coronary arteries arise and distribute normally.  There is minimal  calcification noted at the ostium of the right coronary artery.  Otherwise,  no significant calcifications noted.  Left main coronary artery is normal.  Left anterior descending:  Large vessel which extends into the apex, is  moderately tortuous.  Coronary is somewhat small, but no significant focal  obstructive stenoses are noted.  Circumflex coronary artery:  A large  intermediate branch arises which is tortuous, but with no significant  stenoses noted.  Circumflex coronary artery:  Two marginal branches arise  which are mildly tortuous.  No significant focal obstructive stenoses noted.  The vessel appears normal.  Right coronary artery:  Mild calcification is  noted at the ostium of the right coronary artery.  Minimal narrowing noted  proximally, which may be  catheter-induced spasm.  There was no significant  focal obstructive stenoses noted.  The posterior descending artery is small  and moderately tortuous.   IMPRESSION:  1.  Small and tortuous coronary arteries, with no significant angiographic      coronary disease.  2.  Normal left ventricular function.   RECOMMENDATIONS:  Evaluate for other causes of chest pain.      WST/MEDQ  D:  07/10/2004  T:  07/10/2004  Job:  [id number]   cc:   Vale Haven. Andrey Campanile, M.D.  [address]  [place]  [place] [address]  Fax: [phone]

## 2010-09-24 ENCOUNTER — Encounter: Payer: Self-pay | Admitting: Family Medicine

## 2010-09-25 ENCOUNTER — Ambulatory Visit (INDEPENDENT_AMBULATORY_CARE_PROVIDER_SITE_OTHER): Payer: BC Managed Care – PPO | Admitting: Family Medicine

## 2010-09-25 ENCOUNTER — Encounter: Payer: Self-pay | Admitting: Family Medicine

## 2010-09-25 DIAGNOSIS — R197 Diarrhea, unspecified: Secondary | ICD-10-CM

## 2010-09-25 DIAGNOSIS — K529 Noninfective gastroenteritis and colitis, unspecified: Secondary | ICD-10-CM

## 2010-09-25 NOTE — Progress Notes (Signed)
  Subjective:    Patient ID: Lacey [name], female    DOB: 1951/04/29, 60 y.o.   MRN: [id number]  HPI Chronic diarrhea- taking cholestyramine bid and sxs have completely improved.  Will actually need to hold meds in order to have BM.  Denies abdominal cramping.  When she doesn't take meds will revert to diarrhea.  Weight loss- went to [employer] seminar on bariatric surgery.  Brought letter and info for me to complete for insurance reasons.   Review of Systems For ROS see HPI     Objective:   Physical Exam  Constitutional: She appears well-developed and well-nourished. No distress.  Abdominal: Soft. Bowel sounds are normal. She exhibits no distension. There is no tenderness. There is no rebound.  Skin: Skin is warm and dry.  Psychiatric: She has a normal mood and affect. Her behavior is normal.          Assessment & Plan:

## 2010-09-25 NOTE — Patient Instructions (Signed)
Follow up in 2-3 months to recheck diabetes.  Don't eat before this appt Continue the Questran- i'm so glad it's working!!! I'll complete the surgery stuff and send it over Call with any questions or concerns Happy Memorial Day!!!

## 2010-09-25 NOTE — Assessment & Plan Note (Signed)
sxs have resolved since starting Questran.  Continue meds.

## 2010-10-15 ENCOUNTER — Other Ambulatory Visit: Payer: Self-pay | Admitting: *Deleted

## 2010-10-15 MED ORDER — METOPROLOL SUCCINATE ER 50 MG PO TB24: 50.0000 mg | ORAL_TABLET | Freq: Two times a day (BID) | ORAL | Status: DC

## 2010-10-17 ENCOUNTER — Encounter: Payer: Self-pay | Admitting: Family Medicine

## 2010-10-18 ENCOUNTER — Other Ambulatory Visit: Payer: Self-pay | Admitting: Family Medicine

## 2010-10-20 NOTE — Telephone Encounter (Signed)
Refill sent.

## 2010-10-21 ENCOUNTER — Encounter: Payer: Self-pay | Admitting: Family Medicine

## 2010-10-21 ENCOUNTER — Ambulatory Visit (INDEPENDENT_AMBULATORY_CARE_PROVIDER_SITE_OTHER): Payer: BC Managed Care – PPO | Admitting: Family Medicine

## 2010-10-21 VITALS — BP 120/70 | Temp 98.5°F | Wt 213.8 lb

## 2010-10-21 DIAGNOSIS — G4700 Insomnia, unspecified: Secondary | ICD-10-CM

## 2010-10-21 MED ORDER — ZOLPIDEM TARTRATE ER 6.25 MG PO TBCR: 6.2500 mg | EXTENDED_RELEASE_TABLET | Freq: Every evening | ORAL | Status: DC | PRN

## 2010-10-21 MED ORDER — CLONAZEPAM 1 MG PO TABS: ORAL_TABLET | ORAL | Status: DC

## 2010-10-21 NOTE — Patient Instructions (Signed)
Start the Ambien 3x/week for sleep Continue the Klonopin for restless leg Call with any questions or concerns Hang in there!

## 2010-10-21 NOTE — Progress Notes (Signed)
  Subjective:    Patient ID: Lacey [name], female    DOB: 1951-02-19, 60 y.o.   MRN: [id number]  HPI Insomnia- only sleeping 2-3 hrs/night.  Will have trouble both falling and staying asleep.  Occurring nightly.  Son's girlfriend gave Alfonso Patten and she took this w/out relief.  sxs have been ongoing for 6 months.  Klonopin at night made pt 'relaxed' but didn't help w/ sleep.  Has never taken Ambien for sleep.  Has taken Trazodone previously, 'years ago'.     Review of Systems For ROS see HPI     Objective:   Physical Exam  Constitutional: She appears well-developed and well-nourished. No distress.  Skin: Skin is warm and dry.  Psychiatric: She has a normal mood and affect. Her behavior is normal. Judgment and thought content normal.          Assessment & Plan:

## 2010-10-21 NOTE — Assessment & Plan Note (Addendum)
Worsening per pt report.  Discussed sleep hygiene.  Start Ambien.  Will continue to follow.

## 2010-11-06 ENCOUNTER — Ambulatory Visit (INDEPENDENT_AMBULATORY_CARE_PROVIDER_SITE_OTHER): Payer: BC Managed Care – PPO | Admitting: General Surgery

## 2010-11-06 ENCOUNTER — Encounter (INDEPENDENT_AMBULATORY_CARE_PROVIDER_SITE_OTHER): Payer: Self-pay | Admitting: General Surgery

## 2010-11-06 VITALS — BP 132/78 | HR 66 | Temp 97.2°F | Resp 16 | Ht 62.0 in | Wt 217.2 lb

## 2010-11-06 DIAGNOSIS — Z9884 Bariatric surgery status: Secondary | ICD-10-CM

## 2010-11-06 NOTE — Progress Notes (Signed)
Subjective:     Patient ID: Lacey [name], female   DOB: [birth year], 60 y.o.   MRN: [id number]    BP 132/78  Pulse 66  Temp(Src) 97.2 F (36.2 C) (Temporal)  Resp 16  Ht 5\' 2"  (1.575 m)  Wt 217 lb 3.2 oz (98.521 kg)  BMI 39.73 kg/m2    HPI This patient is a pleasant 60 year old female referred by Laurine Blazer. for consideration for surgical treatment for morbid obesity. The patient has a very significant past surgical history undergoing some type of "stomach stapling" at [facility] about 1981. This was done for morbid obesity. At that time she weighed approximately 230 pounds and lost down to 140 pounds with improvement in her health. Over the past 12 years or so she has unfortunately experienced progressive weight gain. She feels she is now able to eat anything that she wants. She does have some reflux but no nausea vomiting or abdominal pain. Her obesity bothers her and a number of ways. Her most significant complaint complaint is severe arthritis involving mainly her knees. She will require knee replacement in the near future. She is also diabetic and would like to get off her insulin. She is concerned about her long-term health. She has been to our initial information seminar. She is interested in lap band surgery if possible.  Past Medical History  Diagnosis Date  . Diabetes mellitus, type 2   . Anemia   . Hyperlipidemia   . Hypertension   . Arthritis   . SVT (supraventricular tachycardia)   . GERD (gastroesophageal reflux disease)   . Vitamin D deficiency   . Restless leg   . Adenomatous colon polyp     hyperplastic  . Diverticulosis   . Kidney stones    Past Surgical History  Procedure Date  . Abdominal hysterectomy     ovaries remain  . Right arm radial tunnel release   . Hand surgery 5-11  . Cervical disc surgery     replacement c4-c6  . Laparoscopic cholecystectomy   . Gastroplasty    Current Outpatient Prescriptions  Medication Sig Dispense Refill  .  aspirin 81 MG EC tablet Take 81 mg by mouth daily.        Marland Kitchen atorvastatin (LIPITOR) 20 MG tablet Take 20 mg by mouth at bedtime.        . B-D ULTRAFINE III SHORT PEN 31G X 8 MM MISC USE FOUR TIMES A DAY  100 each  1  . betamethasone dipropionate (DIPROLENE) 0.05 % cream Apply topically every 12 (twelve) hours. To affected area       . cholestyramine (QUESTRAN) 4 G packet Take 1 packet by mouth 3 (three) times daily with meals.  90 packet  6  . clonazePAM (KLONOPIN) 1 MG tablet 1 tab nightly for restless leg.  30 tablet  3  . glucose blood (ONE TOUCH ULTRA TEST) test strip 1 each 4 (four) times daily. Test blood sugar       . Hydrocodone-Acetaminophen (VICODIN PO) Take by mouth as directed. Pt is unsure of dose and directions.       . hydrocortisone (WESTCORT) 0.2 % cream Apply topically every 8 (eight) hours as needed. sparingly       . insulin aspart (NOVOLOG FLEXPEN) 100 UNIT/ML injection Inject into the skin daily. 12/13/08 units       . insulin glargine (LANTUS SOLOSTAR) 100 UNIT/ML injection Inject 60 Units into the skin daily.        Marland Kitchen  metFORMIN (GLUCOPHAGE) 1000 MG tablet TAKE 1 TABLET TWICE A DAY  90 tablet  0  . metoprolol (TOPROL XL) 50 MG 24 hr tablet Take 1 tablet (50 mg total) by mouth 2 (two) times daily.  60 tablet  3  . nabumetone (RELAFEN) 500 MG tablet Take 500 mg by mouth 2 (two) times daily.        . nitrofurantoin, macrocrystal-monohydrate, (MACROBID) 100 MG capsule Take 100 mg by mouth at bedtime.        Letta Pate DELICA LANCETS MISC by Does not apply route 4 (four) times daily. Test blood sugar       . oxyCODONE-acetaminophen (PERCOCET) 5-325 MG per tablet Take by mouth every 6 (six) hours as needed. 1-3 tablets       . TRILIPIX 135 MG capsule TAKE 1 CAPSULE DAILY  90 capsule  2  . valsartan-hydrochlorothiazide (DIOVAN HCT) 160-12.5 MG per tablet Take 1 tablet by mouth daily.        Marland Kitchen zolpidem (AMBIEN CR) 6.25 MG CR tablet Take 1 tablet (6.25 mg total) by mouth at bedtime as  needed for sleep.  30 tablet  3   No Known Allergies   Review of Systems  Constitutional: Positive for appetite change and fatigue.  HENT: Negative for hearing loss and ear pain.   Eyes: Negative for visual disturbance.  Respiratory: Negative for apnea, cough, shortness of breath and wheezing.   Cardiovascular: Positive for palpitations. Negative for chest pain.  Genitourinary: Negative for difficulty urinating.  Musculoskeletal: Positive for myalgias, back pain and joint swelling.  Neurological: Negative for syncope.       Objective:   Physical Exam Gen.: Morbidly obese Caucasian female in no acute distress  Skin: Warm and dry without rash or infection  HEENT: No palpable masses or thyromegaly. Oropharynx clear. Sclera nonicteric.  Pulmonary lungs are clear without wheezing or infection  Cardiovascular regular rate and rhythm without murmur. No peripheral edema. No JVD.  Abdomen: Obese. A long well-healed midline incision without evidence of hernia. There are no masses palpable. No discernible organomegaly. No tenderness.  Extremities: Healing arthroscopic incisions on the left knee with mild swelling. Otherwise negative.  Neurologic alert and fully oriented. Gait normal.    Assessment:    60 year old female with morbid obesity and multiple comorbidities including severe arthritis insulin-dependent diabetes mellitus GERD. Her situation however is significantly complicated by her history of what sounds like a vertical banded gastroplasty at [facility] around 1980. If this were a typical gastroplasty I. believe this would make lap banding impossible. She certainly would be at increased risk for surgery. Conversion to Roux-en-Y gastric bypass would likely be more feasible but is a much more extensive operation the lap band and possibly more than she is interested in pursuing.     Plan:   I had a long discussion with the patient today regarding all of these issues. Before  making any final decisions I believe we need to get some more information. Going to obtain her previous records from [facility]. We will also get an upper GI series to evaluate her current anatomy. I will see her back following these studies.

## 2010-11-10 ENCOUNTER — Other Ambulatory Visit (HOSPITAL_COMMUNITY): Payer: Self-pay | Admitting: General Surgery

## 2010-11-10 DIAGNOSIS — Z9884 Bariatric surgery status: Secondary | ICD-10-CM

## 2010-11-19 ENCOUNTER — Ambulatory Visit (HOSPITAL_COMMUNITY)
Admission: RE | Admit: 2010-11-19 | Discharge: 2010-11-19 | Disposition: A | Discharge status: Home / Self Care | Payer: BC Managed Care – PPO | Source: Ambulatory Visit | Attending: General Surgery | Admitting: General Surgery

## 2010-11-19 DIAGNOSIS — R1310 Dysphagia, unspecified: Secondary | ICD-10-CM | POA: Insufficient documentation

## 2010-11-19 DIAGNOSIS — Z9884 Bariatric surgery status: Secondary | ICD-10-CM

## 2010-11-20 ENCOUNTER — Other Ambulatory Visit: Payer: Self-pay | Admitting: Gynecology

## 2010-11-21 ENCOUNTER — Encounter: Payer: Self-pay | Admitting: Physician Assistant

## 2010-11-25 ENCOUNTER — Ambulatory Visit (INDEPENDENT_AMBULATORY_CARE_PROVIDER_SITE_OTHER): Payer: BC Managed Care – PPO | Admitting: Physician Assistant

## 2010-11-25 ENCOUNTER — Encounter: Payer: Self-pay | Admitting: Physician Assistant

## 2010-11-25 VITALS — BP 152/52 | HR 102 | Resp 16 | Ht 62.0 in | Wt 219.0 lb

## 2010-11-25 DIAGNOSIS — R0602 Shortness of breath: Secondary | ICD-10-CM | POA: Insufficient documentation

## 2010-11-25 DIAGNOSIS — I509 Heart failure, unspecified: Secondary | ICD-10-CM

## 2010-11-25 DIAGNOSIS — I471 Supraventricular tachycardia: Secondary | ICD-10-CM

## 2010-11-25 DIAGNOSIS — Z01810 Encounter for preprocedural cardiovascular examination: Secondary | ICD-10-CM | POA: Insufficient documentation

## 2010-11-25 DIAGNOSIS — R079 Chest pain, unspecified: Secondary | ICD-10-CM

## 2010-11-25 DIAGNOSIS — I498 Other specified cardiac arrhythmias: Secondary | ICD-10-CM

## 2010-11-25 DIAGNOSIS — I5032 Chronic diastolic (congestive) heart failure: Secondary | ICD-10-CM

## 2010-11-25 LAB — BRAIN NATRIURETIC PEPTIDE: Pro B Natriuretic peptide (BNP): 29 pg/mL (ref 0.0–100.0)

## 2010-11-25 LAB — BASIC METABOLIC PANEL
BUN: 15 mg/dL (ref 6–23)
Chloride: 110 mEq/L (ref 96–112)
Glucose, Bld: 106 mg/dL — ABNORMAL HIGH (ref 70–99)
Potassium: 4.5 mEq/L (ref 3.5–5.1)

## 2010-11-25 MED ORDER — METOPROLOL SUCCINATE ER 50 MG PO TB24: 100.0000 mg | ORAL_TABLET | Freq: Two times a day (BID) | ORAL | Status: DC

## 2010-11-25 NOTE — Assessment & Plan Note (Signed)
Check a basic metabolic panel and BNP today.  As noted, if her BNP is significantly elevated, I will add a diuretic.

## 2010-11-25 NOTE — Assessment & Plan Note (Signed)
She has fairly chronic chest pain and shortness of breath with exertion.  This is essentially unchanged and she had a negative stress test in December 2011.  She had no CAD at cardiac catheterization in 2006.  At this point, I do not think she needs further stress testing, etc. Prior to proceeding with her orthopedic surgery.  However, I do think she needs further control of her heart rhythm.  I will adjust her Toprol and have her come back in the next couple of weeks to followup with Dr. Graciela Husbands to make final recommendations at that time.  I will also check a BNP.  She does not appear volume overloaded on exam.  However, she does have diastolic dysfunction on her echocardiogram from last year.  If her BNP is significantly elevated, I will start her on a diuretic.

## 2010-11-25 NOTE — Patient Instructions (Signed)
Your physician recommends that you return for lab work in: TODAY BMET/BNP 786.05  Your physician has recommended you make the following change in your medication: INCREASE TOPROL XL 100 MG 1 TABLET TWICE DADILY  Your physician recommends that you schedule a follow-up appointment in: 2 WEEKS WITH DR. Graciela Husbands AS PER SCOTT WEAVER, PA-C

## 2010-11-25 NOTE — Assessment & Plan Note (Signed)
Increase Toprol to 100 mg twice daily.  Followup with Dr. Graciela Husbands in the next couple of weeks.

## 2010-11-25 NOTE — Progress Notes (Signed)
History of Present Illness: Primary Electrophysiologist:  Dr. Sherryl Manges  Lacey [name] is a 60 y.o. female who presents for surgical clearance.  She has a history of diabetes, hypertension, hyperlipidemia.  She had a heart catheterization in 3/06 that demonstrated no CAD.  She saw Dr. Juanda Chance last year for palpitations and tachycardia.  An echocardiogram 04/30/10: EF 55-60%, mild LVH, grade 1 diastolic dysfunction.  She had a Myoview done in 04/30/10: EF 63%, no scar, no ischemia.  Holter monitor done 1/12: SVT with question of AVNRT, sinus tach, atrial tach.  She was referred to EP.  She saw Dr. Graciela Husbands in February 2012.  He felt that she had atrial tach and noted diurnal variation which likely indicated that it is catecholamine sensitive.  He stopped her calcium channel blocker and placed her on Toprol-XL.    She now needs a left total knee replacement for severe degenerative joint disease.  This will be with Dr. Priscille Kluver sometime in September.  She has difficulty getting around.  She is functionally limited.  She still gets short of breath with going up and down steps.  She does note chest pain.  This has been ongoing for the last 2 years or more.  It is a sharp left-sided discomfort.  It comes on at rest.  It is nonexertional.  It only lasts for a few seconds.  There have been no changes in her shortness of breath or her chest pain.  She denies syncope.  She sleeps on one pillow.  She denies orthopnea.  She has dependent edema.  Her palpitations have lessened since starting on Toprol.  Past Medical History  Diagnosis Date  . Diabetes mellitus, type 2   . Anemia   . Hyperlipidemia   . Hypertension   . Arthritis   . GERD (gastroesophageal reflux disease)   . Vitamin D deficiency   . Restless leg   . Adenomatous colon polyp     hyperplastic  . Diverticulosis   . Kidney stones   . Atrial tachycardia     Echo 12/11: EF 55-60%, Mild LVH, grade 1 diast dysfxn;    Myoview 12/11: EF 63%, no scar or  ischemia (cardiac cath in 3/06 with NO CAD);     holter 1/12: ATach    Current Outpatient Prescriptions  Medication Sig Dispense Refill  . aspirin 81 MG EC tablet Take 81 mg by mouth daily.        Marland Kitchen atorvastatin (LIPITOR) 20 MG tablet Take 20 mg by mouth at bedtime.        . B-D ULTRAFINE III SHORT PEN 31G X 8 MM MISC USE FOUR TIMES A DAY  100 each  1  . betamethasone dipropionate (DIPROLENE) 0.05 % cream Apply topically every 12 (twelve) hours. To affected area       . cholestyramine (QUESTRAN) 4 G packet Take 1 packet by mouth 3 (three) times daily with meals.  90 packet  6  . clonazePAM (KLONOPIN) 1 MG tablet 1 tab nightly for restless leg.  30 tablet  3  . etodolac (LODINE) 400 MG tablet Take 400 mg by mouth as directed. 2 po bid       . glucose blood (ONE TOUCH ULTRA TEST) test strip 1 each 4 (four) times daily. Test blood sugar       . Hydrocodone-Acetaminophen (VICODIN PO) Take by mouth as directed. Pt is unsure of dose and directions.       . hydrocortisone (WESTCORT) 0.2 % cream Apply  topically every 8 (eight) hours as needed. sparingly       . insulin aspart (NOVOLOG FLEXPEN) 100 UNIT/ML injection Inject into the skin daily. 12/13/08 units       . insulin glargine (LANTUS SOLOSTAR) 100 UNIT/ML injection Inject 60 Units into the skin daily.        . metFORMIN (GLUCOPHAGE) 1000 MG tablet TAKE 1 TABLET TWICE A DAY  90 tablet  0  . metoprolol (TOPROL XL) 50 MG 24 hr tablet Take 2 tablets (100 mg total) by mouth 2 (two) times daily.  120 tablet  3  . nitrofurantoin, macrocrystal-monohydrate, (MACROBID) 100 MG capsule Take 100 mg by mouth at bedtime.        Letta Pate DELICA LANCETS MISC by Does not apply route 4 (four) times daily. Test blood sugar       . oxyCODONE-acetaminophen (PERCOCET) 5-325 MG per tablet Take by mouth every 6 (six) hours as needed. 1-3 tablets       . TRILIPIX 135 MG capsule TAKE 1 CAPSULE DAILY  90 capsule  2  . valsartan-hydrochlorothiazide (DIOVAN HCT) 160-12.5 MG  per tablet Take 1 tablet by mouth daily.        Marland Kitchen zolpidem (AMBIEN CR) 6.25 MG CR tablet Take 6.25 mg by mouth at bedtime as needed.        Marland Kitchen DISCONTD: metoprolol (TOPROL XL) 50 MG 24 hr tablet Take 1 tablet (50 mg total) by mouth 2 (two) times daily.  60 tablet  3    Allergies: No Known Allergies  Social history:  Nonsmoker  ROS:  Please see history of present illness.  She denies fevers, chills, cough, melena.  All other systems reviewed and negative.  Vital Signs: BP 152/52  Pulse 102  Resp 16  Ht 5\' 2"  (1.575 m)  Wt 219 lb (99.338 kg)  BMI 40.06 kg/m2  PHYSICAL EXAM: Well nourished, well developed, in no acute distress HEENT: normal Neck: no JVD Vascular: No carotid bruits Cardiac:  normal S1, S2; RRR; no murmur Lungs:  clear to auscultation bilaterally, no wheezing, rhonchi or rales Abd: soft, nontender, no hepatomegaly Ext: no edema Skin: warm and dry MSK:  Antalgic gait Neuro:  CNs 2-12 intact, no focal abnormalities noted Psychiatric: Normal affect  EKG:  Sinus tachycardia, heart rate 102, normal axis, nonspecific ST-T wave changes  ASSESSMENT AND PLAN:

## 2010-11-25 NOTE — Assessment & Plan Note (Signed)
Fairly chronic.  There's been no significant change.  This seems atypical.  She had no CAD a cardiac catheterization in 2006.  She had a negative Myoview in December 2011.  No further cardiac workup at this time.

## 2010-11-30 ENCOUNTER — Other Ambulatory Visit: Payer: Self-pay | Admitting: Family Medicine

## 2010-12-03 HISTORY — PX: KNEE ARTHROSCOPY: SUR90

## 2010-12-06 ENCOUNTER — Other Ambulatory Visit: Payer: Self-pay | Admitting: Family Medicine

## 2010-12-10 ENCOUNTER — Other Ambulatory Visit (INDEPENDENT_AMBULATORY_CARE_PROVIDER_SITE_OTHER): Payer: Self-pay | Admitting: General Surgery

## 2010-12-10 DIAGNOSIS — K3189 Other diseases of stomach and duodenum: Secondary | ICD-10-CM

## 2010-12-11 ENCOUNTER — Ambulatory Visit
Admission: RE | Admit: 2010-12-11 | Discharge: 2010-12-11 | Disposition: A | Discharge status: Home / Self Care | Payer: BC Managed Care – PPO | Source: Ambulatory Visit | Attending: General Surgery | Admitting: General Surgery

## 2010-12-11 ENCOUNTER — Other Ambulatory Visit (INDEPENDENT_AMBULATORY_CARE_PROVIDER_SITE_OTHER): Payer: Self-pay | Admitting: General Surgery

## 2010-12-11 DIAGNOSIS — K3189 Other diseases of stomach and duodenum: Secondary | ICD-10-CM

## 2010-12-17 ENCOUNTER — Encounter: Payer: Self-pay | Admitting: Family Medicine

## 2010-12-17 ENCOUNTER — Ambulatory Visit (INDEPENDENT_AMBULATORY_CARE_PROVIDER_SITE_OTHER): Payer: BC Managed Care – PPO | Admitting: Family Medicine

## 2010-12-17 DIAGNOSIS — I509 Heart failure, unspecified: Secondary | ICD-10-CM

## 2010-12-17 DIAGNOSIS — E119 Type 2 diabetes mellitus without complications: Secondary | ICD-10-CM

## 2010-12-17 DIAGNOSIS — E785 Hyperlipidemia, unspecified: Secondary | ICD-10-CM

## 2010-12-17 LAB — LDL CHOLESTEROL, DIRECT: Direct LDL: 59.2 mg/dL

## 2010-12-17 LAB — HEMOGLOBIN A1C: Hgb A1c MFr Bld: 7.4 % — ABNORMAL HIGH (ref 4.6–6.5)

## 2010-12-17 LAB — HEPATIC FUNCTION PANEL
ALT: 27 U/L (ref 0–35)
Alkaline Phosphatase: 93 U/L (ref 39–117)
Bilirubin, Direct: 0 mg/dL (ref 0.0–0.3)
Total Protein: 7.3 g/dL (ref 6.0–8.3)

## 2010-12-17 LAB — BASIC METABOLIC PANEL
CO2: 26 mEq/L (ref 19–32)
Chloride: 111 mEq/L (ref 96–112)
Potassium: 5.7 mEq/L — ABNORMAL HIGH (ref 3.5–5.1)
Sodium: 146 mEq/L — ABNORMAL HIGH (ref 135–145)

## 2010-12-17 NOTE — Assessment & Plan Note (Signed)
Tolerating meds w/out difficulty.  Due for labs.  Adjust meds prn.

## 2010-12-17 NOTE — Progress Notes (Signed)
  Subjective:    Patient ID: Lacey [name], female    DOB: [birth year], 60 y.o.   MRN: [id number]  HPI DM- chronic problem for pt, on metformin/lantus 60/novolog 18.  Is attempting to get lapband to lose weight.  UTD on eye exam.  CBGs have been in the 200s fasting for the last few weeks.  Was out of metformin for over a week.  Yesterday was 99.  No symptomatic lows.  No CP, HAs, visual changes, edema.   Hyperlipidemia- chronic problem for pt, on Trilipix and lipitor.  Denies abd pain, N/V, myalgias.  HTN- chronic problem, well controlled.  On Diovan HCT, metoprolol.  Still having trouble w/ tachycardia- has appt w/ Dr Graciela Husbands tomorrow.  Review of Systems For ROS see HPI     Objective:   Physical Exam  Constitutional: She is oriented to person, place, and time. She appears well-developed and well-nourished. No distress.  HENT:  Head: Normocephalic and atraumatic.  Eyes: Conjunctivae and EOM are normal. Pupils are equal, round, and reactive to light.  Neck: Normal range of motion. Neck supple. No thyromegaly present.  Cardiovascular: Normal rate, regular rhythm, normal heart sounds and intact distal pulses.   No murmur heard. Pulmonary/Chest: Effort normal and breath sounds normal. No respiratory distress.  Abdominal: Soft. She exhibits no distension. There is no tenderness.  Musculoskeletal: She exhibits no edema.  Lymphadenopathy:    She has no cervical adenopathy.  Neurological: She is alert and oriented to person, place, and time.  Skin: Skin is warm and dry.  Psychiatric: She has a normal mood and affect. Her behavior is normal.          Assessment & Plan:

## 2010-12-17 NOTE — Assessment & Plan Note (Signed)
Pt reports elevated CBGs- this is likely due to fact she ran out of metformin.  Will check labs.  Adjust insulin prn.  UTD on eye exam.  Foot exam normal.

## 2010-12-17 NOTE — Patient Instructions (Signed)
Follow up in 3 months to recheck diabetes We'll notify you of your lab results and adjust insulin as needed Call with any questions or concerns Hang in there!

## 2010-12-17 NOTE — Assessment & Plan Note (Signed)
Excellent BP control.  Asymptomatic.  No changes.

## 2010-12-18 ENCOUNTER — Other Ambulatory Visit: Payer: Self-pay | Admitting: Family Medicine

## 2010-12-18 ENCOUNTER — Telehealth: Payer: Self-pay

## 2010-12-18 ENCOUNTER — Encounter: Payer: Self-pay | Admitting: Internal Medicine

## 2010-12-18 ENCOUNTER — Ambulatory Visit (INDEPENDENT_AMBULATORY_CARE_PROVIDER_SITE_OTHER): Payer: BC Managed Care – PPO | Admitting: Internal Medicine

## 2010-12-18 DIAGNOSIS — E875 Hyperkalemia: Secondary | ICD-10-CM

## 2010-12-18 DIAGNOSIS — Z01810 Encounter for preprocedural cardiovascular examination: Secondary | ICD-10-CM

## 2010-12-18 DIAGNOSIS — I5032 Chronic diastolic (congestive) heart failure: Secondary | ICD-10-CM

## 2010-12-18 DIAGNOSIS — I509 Heart failure, unspecified: Secondary | ICD-10-CM

## 2010-12-18 DIAGNOSIS — I498 Other specified cardiac arrhythmias: Secondary | ICD-10-CM

## 2010-12-18 DIAGNOSIS — I471 Supraventricular tachycardia: Secondary | ICD-10-CM

## 2010-12-18 NOTE — Assessment & Plan Note (Signed)
She is cleared for suftergy  I anticipate some tahcycardia afterwards, but should be responsive to betablockers

## 2010-12-18 NOTE — Progress Notes (Signed)
History of Present Illness: Primary Electrophysiologist:  Dr. Sherryl Manges  Lacey [name] is a 60 y.o. female who presents for surgical clearance.  She has a history of diabetes, hypertension, hyperlipidemia.  She had a heart catheterization in 3/06 that demonstrated no CAD.  She saw Dr. Juanda Chance last year for palpitations and tachycardia.  An echocardiogram 04/30/10: EF 55-60%, mild LVH, grade 1 diastolic dysfunction.  She had a Myoview done in 04/30/10: EF 63%, no scar, no ischemia.  Holter monitor done 1/12: SVT with question of AVNRT, sinus tach, atrial tach.  She was referred to EP.  I felt that she had atrial tach and noted diurnal variation which likely indicated that it is catecholamine sensitive adn we stopped her calcium channel blocker and placed her on Toprol-XL.   She saw scott Alben Spittle a month ago who noted HR still fast and increased her toprol and she has noted HR mostly in 80-90 without any untoward effects of drug   She  needs a left total knee replacement for severe degenerative joint disease.  This will be with Dr. Priscille Kluver sometime in September.  She has difficulty getting around.  She is functionally limited.  She still gets short of breath with going up and down steps.  She does note chest pain.  This has been ongoing for the last 2 years or more.  It is a sharp left-sided discomfort.  It comes on at rest.  It is nonexertional.  It only lasts for a few seconds.  There have been no changes in her shortness of breath or her chest pain.  She denies syncope.  She sleeps on one pillow.  She denies orthopnea.  She has dependent edema although mininmally   Past Medical History  Diagnosis Date  . Diabetes mellitus, type 2   . Anemia   . Hyperlipidemia   . Hypertension   . Arthritis   . GERD (gastroesophageal reflux disease)   . Vitamin D deficiency   . Restless leg   . Adenomatous colon polyp     hyperplastic  . Diverticulosis   . Kidney stones   . Atrial tachycardia     Echo 12/11:  EF 55-60%, Mild LVH, grade 1 diast dysfxn;    Myoview 12/11: EF 63%, no scar or ischemia (cardiac cath in 3/06 with NO CAD);     holter 1/12: ATach    Current Outpatient Prescriptions  Medication Sig Dispense Refill  . aspirin 81 MG EC tablet Take 81 mg by mouth daily.        Marland Kitchen atorvastatin (LIPITOR) 20 MG tablet Take 20 mg by mouth at bedtime.        . B-D ULTRAFINE III SHORT PEN 31G X 8 MM MISC USE FOUR TIMES A DAY  100 each  1  . betamethasone dipropionate (DIPROLENE) 0.05 % cream Apply topically every 12 (twelve) hours. To affected area       . cholestyramine (QUESTRAN) 4 G packet Take 1 packet by mouth 3 (three) times daily with meals.  90 packet  6  . clonazePAM (KLONOPIN) 1 MG tablet 1 tab nightly for restless leg.  30 tablet  3  . DIOVAN HCT 160-12.5 MG per tablet TAKE 1 TABLET DAILY  90 tablet  1  . etodolac (LODINE) 400 MG tablet Take 400 mg by mouth as directed. 2 po bid       . glucose blood (ONE TOUCH ULTRA TEST) test strip 1 each 4 (four) times daily. Test blood sugar       .  Hydrocodone-Acetaminophen (VICODIN PO) Take by mouth as directed. Pt is unsure of dose and directions.       . hydrocortisone (WESTCORT) 0.2 % cream Apply topically every 8 (eight) hours as needed. sparingly       . insulin aspart (NOVOLOG FLEXPEN) 100 UNIT/ML injection Inject into the skin daily. 12/13/08 units       . insulin glargine (LANTUS SOLOSTAR) 100 UNIT/ML injection Inject 60 Units into the skin daily.        . metFORMIN (GLUCOPHAGE) 1000 MG tablet TAKE 1 TABLET TWICE A DAY  90 tablet  0  . metoprolol (TOPROL XL) 50 MG 24 hr tablet Take 2 tablets (100 mg total) by mouth 2 (two) times daily.  120 tablet  3  . nitrofurantoin, macrocrystal-monohydrate, (MACROBID) 100 MG capsule Take 100 mg by mouth at bedtime.        Letta Pate DELICA LANCETS MISC by Does not apply route 4 (four) times daily. Test blood sugar       . oxyCODONE-acetaminophen (PERCOCET) 5-325 MG per tablet Take by mouth every 6 (six)  hours as needed. 1-3 tablets       . TRILIPIX 135 MG capsule TAKE 1 CAPSULE DAILY  90 capsule  2  . zolpidem (AMBIEN CR) 6.25 MG CR tablet Take 6.25 mg by mouth at bedtime as needed.          Allergies: No Known Allergies  Social history:  Nonsmoker  ROS:  Please see history of present illness.  She denies fevers, chills, cough, melena.  All other systems reviewed and negative.  Vital Signs: BP 132/65  Pulse 79  Resp 18  Ht 5\' 2"  (1.575 m)  Wt 219 lb (99.338 kg)  BMI 40.06 kg/m2  PHYSICAL EXAM: Well nourished, well developed, in no acute distress HEENT: normal Neck: no JVD Vascular: No carotid bruits Cardiac:  normal S1, S2; RRR; no murmur Lungs:  clear to auscultation bilaterally, no wheezing, rhonchi or rales Abd: soft, nontender, no hepatomegaly Ext: no edema Skin: warm and dry MSK:  Antalgic gait Neuro:  CNs 2-12 intact, no focal abnormalities noted Psychiatric: Normal affect  ASSESSMENT AND PLAN:

## 2010-12-18 NOTE — Telephone Encounter (Signed)
Message copied by Beverely Low on Thu Dec 18, 2010  1:00 PM ------      Message from: Sheliah Hatch      Created: Wed Dec 17, 2010  4:54 PM       K+ is elevated- should repeat K+ Thursday or Friday (dx- hyperkalemia)            Glucose is higher than previous and A1C has increased.  Should continue Metformin, increase Lantus by 2 units every 2 days until fasting CBGs are <130.            Trigs are elevated.  Continue Trilipix and pay attention to diet and try and get regular exercise            Remainder of labs look good

## 2010-12-18 NOTE — Assessment & Plan Note (Signed)
Continue current meds 

## 2010-12-18 NOTE — Assessment & Plan Note (Signed)
Seems well controlled continue current meds

## 2010-12-18 NOTE — Telephone Encounter (Signed)
Pt notified and verbalized understanding. Lab appointment scheduled and order placed

## 2010-12-19 ENCOUNTER — Other Ambulatory Visit (INDEPENDENT_AMBULATORY_CARE_PROVIDER_SITE_OTHER): Payer: BC Managed Care – PPO

## 2010-12-19 DIAGNOSIS — E875 Hyperkalemia: Secondary | ICD-10-CM

## 2010-12-19 LAB — BASIC METABOLIC PANEL
Chloride: 113 mEq/L — ABNORMAL HIGH (ref 96–112)
Creatinine, Ser: 0.8 mg/dL (ref 0.4–1.2)
GFR: 78.74 mL/min (ref >60.00)
Potassium: 5 mEq/L (ref 3.5–5.1)

## 2010-12-19 NOTE — Progress Notes (Signed)
Labs only

## 2010-12-22 ENCOUNTER — Telehealth: Payer: Self-pay

## 2010-12-22 NOTE — Telephone Encounter (Signed)
Left message to notify pt  

## 2010-12-23 ENCOUNTER — Other Ambulatory Visit: Payer: Self-pay | Admitting: Family Medicine

## 2010-12-26 ENCOUNTER — Encounter (INDEPENDENT_AMBULATORY_CARE_PROVIDER_SITE_OTHER): Payer: Self-pay | Admitting: General Surgery

## 2010-12-26 ENCOUNTER — Ambulatory Visit (INDEPENDENT_AMBULATORY_CARE_PROVIDER_SITE_OTHER): Payer: BC Managed Care – PPO | Admitting: General Surgery

## 2010-12-26 NOTE — Patient Instructions (Signed)
Call as needed for any problems with your nutrition appointment or the billing questions we discussed.

## 2010-12-26 NOTE — Progress Notes (Signed)
The patient returns for followup for consideration for bariatric surgery. She had a history of banded gastroplasty at [facility] in the 1980s and I obtained an upper GI series to evaluate her anatomy.  I reviewed these x-rays with the patient. This shows a very small gastric pouch with quite a few staples around the lesser curve and what appears to be a definite narrowing at the gastroplasty site to the lower stomach but free flow of barium through this.  I discussed with the patient that this presents a very difficult anatomical situation for revisional surgery. Her pouch is so small and with open surgery having been performed I strongly doubt that there is any place to safely put a band. I think if she were to have surgery it would require resection of her gastroplasty and Roux-en-Y bypass that would need to be done open. This would be a very extensive procedure and would have a increased risk compared to our standard patient. She understands completely and agrees that this is not something that she would like to take on.  She does have a small gastric pouch and there may be some restriction that could help her with a proper diet. We will give her an appointment to see our nutritionist at the bariatric center for weight loss program. I will remain available as needed.

## 2011-01-15 ENCOUNTER — Other Ambulatory Visit: Payer: Self-pay | Admitting: Family Medicine

## 2011-01-28 ENCOUNTER — Encounter: Payer: BC Managed Care – PPO | Attending: General Surgery | Admitting: *Deleted

## 2011-01-28 ENCOUNTER — Encounter: Payer: Self-pay | Admitting: *Deleted

## 2011-01-28 DIAGNOSIS — Z01818 Encounter for other preprocedural examination: Secondary | ICD-10-CM | POA: Insufficient documentation

## 2011-01-28 DIAGNOSIS — Z713 Dietary counseling and surveillance: Secondary | ICD-10-CM | POA: Insufficient documentation

## 2011-01-28 NOTE — Patient Instructions (Addendum)
Goals:  Follow Pre-Op Dietary Meal Plan  Eat 3-6 small meals/snacks, every 3-5 hrs  Increase lean protein foods to meet 80-90g goal  Increase fluid intake to 64oz +   Consume 30 grams of carbohydrate (fruit, whole grain, starchy vegetable) with meals (3-6 times/day)  Avoid drinking 15 minutes before, during and 30 minutes after eating  Aim for >30 min of physical activity daily   Continue to monitor glucose levels and f/u with MD on Endocrinologist referral  Bring glucose log to next visit

## 2011-01-28 NOTE — Progress Notes (Signed)
   Initial Bariatric Surgery visit: 24 Years Post-Operative Banded-Gastroplasty Surgery  Medical Nutrition Therapy:  Appt start time: 1530 end time:  1630.  Assessment:  Primary concerns today: post-operative bariatric surgery nutrition management. Lacey [name] reports that she had a gastroplasty at [facility] in 1988. She reports that she lost 100 lbs initially yet has regained this weight since. She originally wanted a surgical revision to the gastroplasty yet in discussing this with Dr. Johna Sheriff they agreed it would be too risky. Pt notes some restriction in portion sizes yet is still able to eat what she wants to eat. Pt here today with help for getting back on track with dietary behaviors.  Weight today: 229.1 lbs Total weight lost: Pt reports a weight loss of 100# after surgery yet gained it all back over the years BMI: 42% Weight goal: 150-160 lbs  24-hr recall:  B (7-8 AM): McDonald[employer] Breakfast Burrito OR Skips Snk (9-10  AM): Nabs   L (12-1 PM): Salad w/ lettuce, tomato, cheese, w/ Ranch Snk (PM): N/A  D (5:30-6:60 PM): Baked chicken, green beans, corn, w/ macaroni salad Snk (PM): N/A  Fluid intake: water, diet mountain dew = 16-36 oz Estimated total protein intake: 40-55g   Medications: See updated medication list Supplementation: No supplementation use  CBG monitoring: Daily, Multiple checks Average CBG per patient: Fasting = 145-175, Before meals = 150-200,  After meals = 200's Last patient reported A1c: 7.4  Lab Results  Component Value Date   HGBA1C 7.4* 12/17/2010   Using straws: No Drinking while eating:No Hair loss: No Carbonated beverages: Yes, 3 bottles diet mountain dew/day N/V/D/C: Diarrhea, contstant Dumping syndrome: N/A  Recent physical activity:  Limited due to arthritis pain  Progress Towards Goal(s):  In progress.  Handouts given during visit include:  Bariatric Surgery Pre-Op Diet  Carbohydrate Counting (Yellow-Card)   Nutritional Diagnosis:  [place]-3.3  Overweight/obesity As related to poor dietary habits and lack of physical activity.  As evidenced by pt w/ BMI > 30%.    Intervention:  Nutrition education.  Monitoring/Evaluation:  Dietary intake, exercise, lap band fills, and body weight. Follow up PRN.

## 2011-01-29 ENCOUNTER — Other Ambulatory Visit: Payer: Self-pay | Admitting: Family Medicine

## 2011-01-29 LAB — BASIC METABOLIC PANEL
CO2: 26
Calcium: 8.9
GFR calc Af Amer: >60
Sodium: 134 — ABNORMAL LOW

## 2011-01-29 LAB — CBC
Hemoglobin: 12.4
MCHC: 33.6
RBC: 3.99
WBC: 10.8 — ABNORMAL HIGH

## 2011-02-02 LAB — URINE CULTURE
Colony Count: >=100000
Special Requests: NEGATIVE

## 2011-02-02 LAB — POCT I-STAT 4, (NA,K, GLUC, HGB,HCT)
Glucose, Bld: 159 — ABNORMAL HIGH
HCT: 37
Hemoglobin: 11.9 — ABNORMAL LOW
Hemoglobin: 12.6
Potassium: 3.8
Potassium: 4.2
Sodium: 141

## 2011-02-02 LAB — BASIC METABOLIC PANEL
BUN: 11
Creatinine, Ser: 1.19
GFR calc non Af Amer: 47 — ABNORMAL LOW
Glucose, Bld: 166 — ABNORMAL HIGH

## 2011-02-02 LAB — GLUCOSE, CAPILLARY
Glucose-Capillary: 123 — ABNORMAL HIGH
Glucose-Capillary: 157 — ABNORMAL HIGH
Glucose-Capillary: 175 — ABNORMAL HIGH
Glucose-Capillary: 196 — ABNORMAL HIGH

## 2011-02-02 LAB — CULTURE, BLOOD (ROUTINE X 2): Culture: NO GROWTH

## 2011-02-02 LAB — CBC
HCT: 29.8 — ABNORMAL LOW
Platelets: 159
RDW: 14.2
WBC: 10

## 2011-02-05 ENCOUNTER — Ambulatory Visit (INDEPENDENT_AMBULATORY_CARE_PROVIDER_SITE_OTHER): Payer: BC Managed Care – PPO

## 2011-02-05 DIAGNOSIS — Z23 Encounter for immunization: Secondary | ICD-10-CM

## 2011-02-11 ENCOUNTER — Other Ambulatory Visit: Payer: Self-pay | Admitting: Urology

## 2011-02-11 DIAGNOSIS — N200 Calculus of kidney: Secondary | ICD-10-CM

## 2011-02-14 ENCOUNTER — Other Ambulatory Visit: Payer: Self-pay | Admitting: Family Medicine

## 2011-02-24 ENCOUNTER — Other Ambulatory Visit: Payer: Self-pay | Admitting: Urology

## 2011-02-24 ENCOUNTER — Other Ambulatory Visit (HOSPITAL_COMMUNITY): Payer: Self-pay | Admitting: Urology

## 2011-02-24 ENCOUNTER — Encounter (HOSPITAL_COMMUNITY): Payer: BC Managed Care – PPO

## 2011-02-24 ENCOUNTER — Ambulatory Visit (HOSPITAL_COMMUNITY)
Admission: RE | Admit: 2011-02-24 | Discharge: 2011-02-24 | Disposition: A | Discharge status: Home / Self Care | Payer: BC Managed Care – PPO | Source: Ambulatory Visit | Attending: Urology | Admitting: Urology

## 2011-02-24 DIAGNOSIS — Z01811 Encounter for preprocedural respiratory examination: Secondary | ICD-10-CM

## 2011-02-24 DIAGNOSIS — N200 Calculus of kidney: Secondary | ICD-10-CM | POA: Insufficient documentation

## 2011-02-24 DIAGNOSIS — Z01812 Encounter for preprocedural laboratory examination: Secondary | ICD-10-CM | POA: Insufficient documentation

## 2011-02-24 DIAGNOSIS — Z87891 Personal history of nicotine dependence: Secondary | ICD-10-CM | POA: Insufficient documentation

## 2011-02-24 DIAGNOSIS — Z01818 Encounter for other preprocedural examination: Secondary | ICD-10-CM | POA: Insufficient documentation

## 2011-02-24 DIAGNOSIS — I10 Essential (primary) hypertension: Secondary | ICD-10-CM | POA: Insufficient documentation

## 2011-02-24 LAB — BASIC METABOLIC PANEL
BUN: 15 mg/dL (ref 6–23)
CO2: 25 mEq/L (ref 19–32)
Calcium: 10 mg/dL (ref 8.4–10.5)
Chloride: 103 mEq/L (ref 96–112)
Creatinine, Ser: 0.65 mg/dL (ref 0.50–1.10)
GFR calc Af Amer: >90 mL/min (ref >90)
GFR calc non Af Amer: >90 mL/min (ref >90)
Glucose, Bld: 148 mg/dL — ABNORMAL HIGH (ref 70–99)
Potassium: 4.1 mEq/L (ref 3.5–5.1)
Sodium: 139 mEq/L (ref 135–145)

## 2011-02-24 LAB — CBC
HCT: 36 % (ref 36.0–46.0)
Hemoglobin: 11.7 g/dL — ABNORMAL LOW (ref 12.0–15.0)
MCH: 29.8 pg (ref 26.0–34.0)
MCHC: 32.5 g/dL (ref 30.0–36.0)
MCV: 91.6 fL (ref 78.0–100.0)
Platelets: 210 10*3/uL (ref 150–400)
RBC: 3.93 MIL/uL (ref 3.87–5.11)
RDW: 13.9 % (ref 11.5–15.5)
WBC: 8.8 10*3/uL (ref 4.0–10.5)

## 2011-02-24 LAB — SURGICAL PCR SCREEN
MRSA, PCR: NEGATIVE
Staphylococcus aureus: NEGATIVE

## 2011-02-26 ENCOUNTER — Other Ambulatory Visit: Payer: Self-pay | Admitting: Urology

## 2011-02-26 ENCOUNTER — Ambulatory Visit (HOSPITAL_COMMUNITY): Payer: BC Managed Care – PPO

## 2011-02-26 ENCOUNTER — Ambulatory Visit (HOSPITAL_COMMUNITY)
Admission: RE | Admit: 2011-02-26 | Discharge: 2011-02-26 | Disposition: A | Discharge status: Home / Self Care | Payer: BC Managed Care – PPO | Source: Ambulatory Visit | Attending: Urology | Admitting: Urology

## 2011-02-26 DIAGNOSIS — N200 Calculus of kidney: Secondary | ICD-10-CM

## 2011-02-26 LAB — GLUCOSE, CAPILLARY: Glucose-Capillary: 142 mg/dL — ABNORMAL HIGH (ref 70–99)

## 2011-02-26 MED ORDER — IOHEXOL 300 MG/ML  SOLN: 45.0000 mL | Freq: Once | INTRAMUSCULAR | Status: AC | PRN

## 2011-02-27 ENCOUNTER — Telehealth: Payer: Self-pay | Admitting: Family Medicine

## 2011-02-27 ENCOUNTER — Ambulatory Visit (HOSPITAL_COMMUNITY): Payer: BC Managed Care – PPO

## 2011-02-27 ENCOUNTER — Ambulatory Visit (HOSPITAL_COMMUNITY)
Admission: RE | Admit: 2011-02-27 | Discharge: 2011-02-28 | Disposition: A | Discharge status: Home / Self Care | Payer: BC Managed Care – PPO | Source: Ambulatory Visit | Attending: Urology | Admitting: Urology

## 2011-02-27 DIAGNOSIS — K219 Gastro-esophageal reflux disease without esophagitis: Secondary | ICD-10-CM | POA: Insufficient documentation

## 2011-02-27 DIAGNOSIS — E669 Obesity, unspecified: Secondary | ICD-10-CM | POA: Insufficient documentation

## 2011-02-27 DIAGNOSIS — I10 Essential (primary) hypertension: Secondary | ICD-10-CM | POA: Insufficient documentation

## 2011-02-27 DIAGNOSIS — E119 Type 2 diabetes mellitus without complications: Secondary | ICD-10-CM | POA: Insufficient documentation

## 2011-02-27 DIAGNOSIS — N200 Calculus of kidney: Secondary | ICD-10-CM | POA: Insufficient documentation

## 2011-02-27 DIAGNOSIS — Z79899 Other long term (current) drug therapy: Secondary | ICD-10-CM | POA: Insufficient documentation

## 2011-02-27 DIAGNOSIS — F17200 Nicotine dependence, unspecified, uncomplicated: Secondary | ICD-10-CM | POA: Insufficient documentation

## 2011-02-27 HISTORY — PX: PERCUTANEOUS NEPHROSTOLITHOTOMY: SHX2207

## 2011-02-27 LAB — GLUCOSE, CAPILLARY
Glucose-Capillary: 223 mg/dL — ABNORMAL HIGH (ref 70–99)
Glucose-Capillary: 138 mg/dL — ABNORMAL HIGH (ref 70–99)
Glucose-Capillary: 147 mg/dL — ABNORMAL HIGH (ref 70–99)
Glucose-Capillary: 214 mg/dL — ABNORMAL HIGH (ref 70–99)

## 2011-02-27 NOTE — Telephone Encounter (Signed)
Last filled 6-19-#30 3, last OV 12-26-10

## 2011-02-28 LAB — GLUCOSE, CAPILLARY: Glucose-Capillary: 166 mg/dL — ABNORMAL HIGH (ref 70–99)

## 2011-02-28 NOTE — Telephone Encounter (Signed)
Ok for #30, 3 refills 

## 2011-03-02 ENCOUNTER — Other Ambulatory Visit: Payer: Self-pay | Admitting: Family Medicine

## 2011-03-02 ENCOUNTER — Other Ambulatory Visit: Payer: Self-pay | Admitting: *Deleted

## 2011-03-02 LAB — GLUCOSE, CAPILLARY: Glucose-Capillary: 177 mg/dL — ABNORMAL HIGH (ref 70–99)

## 2011-03-02 MED ORDER — CLONAZEPAM 1 MG PO TABS: ORAL_TABLET | ORAL | Status: DC

## 2011-03-02 NOTE — Telephone Encounter (Signed)
Faxed refill to [facility] on [address] for Klonipin 30 tablets with 3 refills

## 2011-03-02 NOTE — Op Note (Signed)
NAME:  INGRA, ROTHER NO.:  [id number]  MEDICAL RECORD NO.:  [id number]  LOCATION:  1417                         FACILITY:  [facility]  PHYSICIAN:  Dakwan Pridgen C. Vernie Ammons, M.D.  DATE OF BIRTH:  [birth year]  DATE OF PROCEDURE:  02/27/2011 DATE OF DISCHARGE:                              OPERATIVE REPORT   DATE OF OPERATION:  February 27, 2011.  PREOPERATIVE DIAGNOSIS:  Left renal calculi.  POSTOPERATIVE DIAGNOSIS:  Left renal calculi.  PROCEDURE:  Left percutaneous nephrostolithotomy, 2.6 cm.  SURGEON:  Macarthur Lorusso C. Vernie Ammons, MD  ANESTHESIA:  General.  BLOOD LOSS:  Minimal.  DRAINS:  A 6-French, 24 cm double-J stent in the left ureter.  SPECIMEN:  Stone given to the patient.  COMPLICATIONS:  None.  INDICATIONS:  The patient is a 60 year old female with a long history of left-sided nephrolithiasis.  She was found to have multiple stones in her left kidney including stones in the lower pole calices as well as the renal pelvis.  We discussed the treatment options and due to large stone volume, has elected to proceed with a percutaneous procedure.  The risks, complications, alternatives, and limitations were discussed.  The patient understands and has elected to proceed.  DESCRIPTION OF OPERATION:  After informed consent, the patient was brought to the major OR, administered general anesthesia and then moved to the prone position.  A Foley catheter was placed prior to moving to the prone position and she then had her left flank region sterilely prepped and draped.  An official time-out was then performed.  The previously placed left percutaneous nephrostomy catheter was then used to place a 0.038-inch floppy-tipped guidewire down the ureter through this catheter and into the area of the bladder under direct fluoroscopic control.  I then removed the catheter, made a transverse skin incision, and passed the coaxial catheter over the guidewire down the ureter under  fluoroscopy.  The inner portion of the coaxial catheter was then left in place as well as the guidewire and a 2nd guidewire was then passed through this catheter and down the ureter, again under fluoroscopy into the area of the bladder.  The coaxial catheter was then removed and the guidewires were left in place and one was secured to the drape as a safety guidewire.  The nephrostomy tract was then dilated by passing the NephroMax dilating balloon over the guidewire and placing this at the location of the renal pelvis.  I then inflated the balloon and then passed the 30-French nephrostomy sheath over the balloon into the area of the renal pelvis under fluoroscopy.  I then deflated the balloon and left the sheath in place as well as the working guidewire.  The 26-French rigid nephroscope was then passed under direct vision into the area of the renal pelvis. I identified the stone and was able to extract it whole, and a second stone was fragmented and the portions extracted with 3-pronged graspers as well.  I then removed the rigid nephroscope and performed flexible nephroscopy.  The 17-French flexible cystoscope was then passed under direct vision into the area of the renal pelvis and then directed into the upper pole. This was confirmed  by fluoroscopy.  I noted the upper pole caliceal system was compound and each of the papilla was visualized and noted to be free of stones.  There were no stones found within the upper pole calyceal system.  I then advanced the scope back down to the renal pelvis and into a middle pole calix where no stones were identified. The renal pelvis also appeared to be normal and free of any further stone fragments.  I then attempted to retroflex the scope and advance it into a lower pole calyx, but was unsuccessful.  A nephrostogram was then performed by injecting full-strength contrast through the flexible cystoscope under direct fluoroscopic control and this  allowed me to visualize a generous renal pelvis and the calyceal system.  There did not appear to be any filling defects in the calyceal system, although there was one density that appeared to be present adjacent to where the cystoscope was located.  This was felt to be in the parallel calix.  I tried multiple maneuvers with both flexible and then again the rigid nephroscope, but was unable to visualize the stone that was noted on CT scan and what appears to be a parallel calix.  I passed the rigid nephroscope over the working guidewire and then passed the double-J stent over the guidewire into the area of the bladder and then began removing the guidewire with good curl being noted in the bladder.  There was good curl in the area of the renal pelvis and I used the 2-pronged grasper to position the stent in the renal pelvis and then confirmed this fluoroscopically.  There was no bleeding at the end of the procedure and there was very little bleeding throughout the entire procedure.  We therefore elected to proceed with removal of the nephrostomy sheath without the need for a nephrostomy tube.  The safety guidewire was therefore removed and I measured the depth to the edge of the renal parenchyma and then passed the laparoscopic FloSeal applicator through the nephrostomy sheath and into the depth of the previously measured renal parenchyma and began injecting the FloSeal as I removed the sheath and the laparoscopic applicator.  This filled the track with FloSeal and there was no bleeding noted at the end of the procedure.  The skin was closed with running subcuticular 4-0 Monocryl suture and then also secured with Dermabond.  A sterile occlusive dressing was then applied and the patient was awakened and taken to recovery room in stable and satisfactory condition.  She tolerated the procedure well and there were no intraoperative complications.  She will be monitored overnight with  anticipation of discharge in the morning.     Lulu Hirschmann C. Vernie Ammons, M.D.     MCO/MEDQ  D:  02/27/2011  T:  02/27/2011  Job:  [id number]  Electronically Signed by Ihor Gully M.D. on 03/02/2011 12:21:08 PM

## 2011-03-16 ENCOUNTER — Other Ambulatory Visit: Payer: Self-pay | Admitting: Urology

## 2011-03-19 ENCOUNTER — Ambulatory Visit: Payer: Self-pay | Admitting: Family Medicine

## 2011-03-20 ENCOUNTER — Ambulatory Visit (INDEPENDENT_AMBULATORY_CARE_PROVIDER_SITE_OTHER): Payer: BC Managed Care – PPO | Admitting: Family Medicine

## 2011-03-20 ENCOUNTER — Encounter: Payer: Self-pay | Admitting: Family Medicine

## 2011-03-20 ENCOUNTER — Telehealth: Payer: Self-pay | Admitting: *Deleted

## 2011-03-20 DIAGNOSIS — L02211 Cutaneous abscess of abdominal wall: Secondary | ICD-10-CM | POA: Insufficient documentation

## 2011-03-20 DIAGNOSIS — IMO0001 Reserved for inherently not codable concepts without codable children: Secondary | ICD-10-CM

## 2011-03-20 DIAGNOSIS — Q181 Preauricular sinus and cyst: Secondary | ICD-10-CM

## 2011-03-20 DIAGNOSIS — R209 Unspecified disturbances of skin sensation: Secondary | ICD-10-CM

## 2011-03-20 DIAGNOSIS — L03319 Cellulitis of trunk, unspecified: Secondary | ICD-10-CM

## 2011-03-20 DIAGNOSIS — L723 Sebaceous cyst: Secondary | ICD-10-CM

## 2011-03-20 DIAGNOSIS — L03311 Cellulitis of abdominal wall: Secondary | ICD-10-CM

## 2011-03-20 DIAGNOSIS — E119 Type 2 diabetes mellitus without complications: Secondary | ICD-10-CM

## 2011-03-20 LAB — BASIC METABOLIC PANEL
CO2: 25 mEq/L (ref 19–32)
Calcium: 9.1 mg/dL (ref 8.4–10.5)
Creatinine, Ser: 0.8 mg/dL (ref 0.4–1.2)

## 2011-03-20 MED ORDER — CEPHALEXIN 500 MG PO CAPS: 500.0000 mg | ORAL_CAPSULE | Freq: Two times a day (BID) | ORAL | Status: AC

## 2011-03-20 MED ORDER — CEPHALEXIN 500 MG PO CAPS: 500.0000 mg | ORAL_CAPSULE | Freq: Four times a day (QID) | ORAL | Status: DC

## 2011-03-20 NOTE — Progress Notes (Signed)
  Subjective:    Patient ID: Lacey [name], female    DOB: 11/28/1950, 60 y.o.   MRN: [id number]  HPI DM- chronic problem for pt, on metformin, insulin.  CBGs 90-110s.  Denies symptomatic lows.  No CP, SOB, HAs, visual changes, N/V, abd pain.  Lump on abd- red,painful.  1st noticed 2 weeks ago.  Thought it was due to insulin injxns.  R ear lump- initially thought it was a blackhead at canal opening, painful to touch.  'it's been there a long while' 6-12 months  R lateral thigh pain- reports area is painful when she stands, 'feels like a pulling pain', some numbness intermittently.  Used a pain patch w/out relief.   Review of Systems For ROS see HPI     Objective:   Physical Exam  Constitutional: She is oriented to person, place, and time. She appears well-developed and well-nourished. No distress.  HENT:  Head: Normocephalic and atraumatic.  Eyes: Conjunctivae and EOM are normal. Pupils are equal, round, and reactive to light.  Neck: Normal range of motion. Neck supple. No thyromegaly present.  Cardiovascular: Normal rate, regular rhythm, normal heart sounds and intact distal pulses.   No murmur heard. Pulmonary/Chest: Effort normal and breath sounds normal. No respiratory distress.  Abdominal: Soft. She exhibits no distension. There is no tenderness.  Musculoskeletal: Normal range of motion. She exhibits no edema and no tenderness (no TTP w/ R hip ROM or palpation over lateral thigh).  Lymphadenopathy:    She has no cervical adenopathy.  Neurological: She is alert and oriented to person, place, and time.  Skin: Skin is warm and dry. There is erythema (2.5 cm area of erythema, no induration or fluctuance- consistent w/ cellulitis of abdominal wall).  Psychiatric: She has a normal mood and affect. Her behavior is normal.          Assessment & Plan:

## 2011-03-20 NOTE — Telephone Encounter (Signed)
Called to corect medication for keflix to BID with 14#. Spoke to pt tech and they changed the medication order

## 2011-03-20 NOTE — Patient Instructions (Signed)
Schedule your complete physical in 3 months We'll notify you of your lab results The ear thing is a small mole or cyst- nothing to worry about unless it's getting bigger The the keflex for the redness on your belly Call with any questions or concerns Happy Holidays!!!

## 2011-03-23 ENCOUNTER — Encounter: Payer: Self-pay | Admitting: *Deleted

## 2011-03-30 ENCOUNTER — Encounter: Payer: Self-pay | Admitting: Family Medicine

## 2011-03-30 ENCOUNTER — Ambulatory Visit (INDEPENDENT_AMBULATORY_CARE_PROVIDER_SITE_OTHER): Payer: BC Managed Care – PPO | Admitting: Family Medicine

## 2011-03-30 VITALS — BP 135/80 | HR 92 | Temp 97.8°F | Ht 62.5 in | Wt 220.0 lb

## 2011-03-30 DIAGNOSIS — L02219 Cutaneous abscess of trunk, unspecified: Secondary | ICD-10-CM

## 2011-03-30 DIAGNOSIS — L03311 Cellulitis of abdominal wall: Secondary | ICD-10-CM

## 2011-03-30 MED ORDER — ZOLPIDEM TARTRATE ER 6.25 MG PO TBCR: 6.2500 mg | EXTENDED_RELEASE_TABLET | Freq: Every evening | ORAL | Status: DC | PRN

## 2011-03-30 MED ORDER — DOXYCYCLINE HYCLATE 100 MG PO TABS: 100.0000 mg | ORAL_TABLET | Freq: Two times a day (BID) | ORAL | Status: AC

## 2011-03-30 MED ORDER — CLONAZEPAM 1 MG PO TABS: ORAL_TABLET | ORAL | Status: DC

## 2011-03-30 NOTE — Patient Instructions (Signed)
Switch to Doxycycline for the infection- take w/ food Hot compresses If it drains, that's good Call with any questions or concerns Hang in there!

## 2011-03-30 NOTE — Progress Notes (Signed)
  Subjective:    Patient ID: Lacey [name], female    DOB: 1950/07/10, 60 y.o.   MRN: [id number]  HPI abd cellulitis- seen last week, started on Keflex.  Area still painful.  She doesn't feel it looks worse.  No drainage.   Review of Systems For ROS see HPI     Objective:   Physical Exam  Abdominal: Soft. There is tenderness (4.5 cm area of erythema, TTP, mild induration.  no fluctuance.).          Assessment & Plan:

## 2011-03-31 NOTE — Assessment & Plan Note (Signed)
New.  Likely caused by insulin injxns and skin flora.  Start keflex.  Reviewed supportive care and red flags that should prompt return.  Pt expressed understanding and is in agreement w/ plan.

## 2011-03-31 NOTE — Assessment & Plan Note (Signed)
Area of concern in pt's R ear appears to be a cyst that she has picked at.  No concerning features at this time.  Reviewed supportive care and red flags that should prompt return.  Pt expressed understanding and is in agreement w/ plan.

## 2011-03-31 NOTE — Assessment & Plan Note (Signed)
Deteriorating.  Area is now forming what appears to be an early abscess.  Since it is not improving on keflex we must assume MRSA.  Switch to Doxy.  Reviewed supportive care and red flags that should prompt return.  Pt expressed understanding and is in agreement w/ plan.

## 2011-03-31 NOTE — Assessment & Plan Note (Signed)
Pt w/ full ROM of R hip and no TTP over thigh muscles.  Most likely a cutaneous nerve that is being pinched as she has no motor or muscle involvement.  Pt to heat or ice prn.  Reviewed supportive care and red flags that should prompt return.  Pt expressed understanding and is in agreement w/ plan.

## 2011-03-31 NOTE — Assessment & Plan Note (Signed)
Chronic problem.  Level of control fluctuates.  Check labs.  Adjust meds prn.

## 2011-04-02 ENCOUNTER — Encounter (HOSPITAL_BASED_OUTPATIENT_CLINIC_OR_DEPARTMENT_OTHER): Payer: Self-pay | Admitting: *Deleted

## 2011-04-02 NOTE — Progress Notes (Signed)
NPO AFTER MN. PT ARRIVES AT 0745. NEEDS ISTAT AND KUB. CURRENT EKG W/ CHART. WILL TAKE METOPROLOL , PREVACID, AND IF NEEDED HYDROCODONE W/ SIP OF WATER.

## 2011-04-03 ENCOUNTER — Other Ambulatory Visit: Payer: Self-pay | Admitting: Internal Medicine

## 2011-04-03 MED ORDER — METOPROLOL SUCCINATE ER 50 MG PO TB24: 100.0000 mg | ORAL_TABLET | Freq: Two times a day (BID) | ORAL | Status: DC

## 2011-04-04 NOTE — H&P (Signed)
History of Present Illness     Left nephrolithiasis: She has a long history of left nephrolithiasis dating back to 4/02 at which time she underwent lithotripsy. She then required ureteroscopy and basketing of a stone in 5/04 and then had 2 episodes where she required cystoscopy and stent placement followed by ureteroscopy the last in 10/09. A CT scan done on 02/05/11 revealed no right renal calculi but multiple left renal calculi located in an anterior as well as posterior lower pole calyx as well as the renal pelvis and near the UPJ. The largest stone measuring greater than a centimeter had Hounsfield units of approximately 400. The left kidney itself is atrophic with evidence of scarring.  Interval history: At the time of her procedure I was able to clear all of the stones except one. It was located in a parallel lower pole calyx. A stent was left indwelling but not a nephrostomy tube. The stent has been causing a lot of bladder irritation primarily. She does have a little bit of mild discomfort in the flank with urination. She finds that she's going frequently and having very small amounts. She said the only time she gets relief is when she lays down at night.    Past Medical History Problems  1. History of  Arthritis V13.4 2. History of  Diabetes Mellitus 250.00 3. History of  Esophageal Reflux 530.81 4. History of  Heartburn 787.1 5. History of  Hydronephrosis 591 6. History of  Hypercholesterolemia 272.0 7. History of  Hypertension 401.9 8. History of  Nephrolithiasis V13.01 9. History of  Ureteral Stone 592.1  Surgical History Problems  1. History of  Cholecystectomy 2. History of  Cystoscopy With Insertion Of Ureteral Stent Left 3. History of  Cystoscopy With Insertion Of Ureteral Stent Left 4. History of  Cystoscopy With Ureteroscopy With Lithotripsy Left 5. History of  Cystoscopy With Ureteroscopy With Lithotripsy Left 6. History of  Cystoscopy With Ureteroscopy With Removal Of  Calculus Left 7. History of  Hand Surgery 8. History of  High Gastric Bypass 9. History of  Hysterectomy 10. History of  Kidney Surgery 11. History of  Lithotripsy Left 12. History of  Neck Surgery 13. History of  Percutaneous Lithotomy For Stone Over 2cm. 14. History of  Renal Endoscopy Via Nephrostomy With Ureteral Catheterizati  Current Meds 1. Atorvastatin Calcium 20 MG Oral Tablet; Therapy: 30Jan2012 to 2. Cholestyramine 4 GM Oral Packet; Therapy: 23Apr2012 to 3. ClonazePAM 1 MG Oral Tablet; Therapy: 19Jun2012 to 4. Diovan HCT 160-12.5 MG Oral Tablet; Therapy: 21Jul2011 to 5. Etodolac 400 MG Oral Tablet; Therapy: 04Oct2012 to 6. Hydrocortisone Valerate 0.2 % External Cream; Therapy: 01Jul2011 to 7. Lantus 100 UNIT/ML Subcutaneous Solution; Therapy: (Recorded:05Oct2012) to 8. MetFORMIN HCl 500 MG Oral Tablet; Therapy: (Recorded:05Oct2012) to 9. Metoprolol Succinate ER 50 MG Oral Tablet Extended Release 24 Hour; Therapy: 13Jun2012 to 10. Nitrofurantoin Monohyd Macro 100 MG Oral Capsule; TAKE 1 CAPSULE AT BEDTIME; Therapy:   03Mar2011 to (Evaluate:21Mar2011); Last Rx:03Mar2011 11. NovoLOG 100 UNIT/ML Subcutaneous Solution; Therapy: (Recorded:05Oct2012) to 12. Oxycodone-Acetaminophen 5-325 MG Oral Tablet; TAKE 1 TO 2 TABLETS EVERY 4 TO 6 HOURS   AS NEEDED FOR PAIN; Therapy: 04Oct2012 to (Evaluate:08Oct2012); Last Rx:04Oct2012 13. Promethazine HCl 25 MG Oral Tablet; TAKE 1 TABLET EVERY 4 TO 6 HOURS AS NEEDED FOR   NAUSEA; Therapy: 04Oct2012 to (Evaluate:09Oct2012)  Requested for: 04Oct2012; Last   Rx:04Oct2012 14. Trilipix 135 MG Oral Capsule Delayed Release; Therapy: 18Oct2010 to  Allergies Medication  1. No Known Drug  Allergies  Family History Problems  1. Paternal history of  Death In The Family Father Respiratory failure 2. Family history of  Family Health Status Number Of Children 2-sons 1-daughter 3. Paternal history of  Heart Disease V17.49 4. Maternal history of   Heart Disease V17.49  Social History Problems  1. Marital History - Currently Married 2. Occupation: office work 3. Tobacco Use V15.82 Denied  4. Alcohol Use 5. Caffeine Use  Review of Systems Genitourinary and gastrointestinal system(s) were reviewed and pertinent findings if present are noted.  Genitourinary: urinary frequency, urinary urgency, dysuria, nocturia and hematuria.  Gastrointestinal: flank pain  Physical Exam Constitutional: Well nourished and well developed . No acute distress.  ENT:. The ears and nose are normal in appearance.  Neck: The appearance of the neck is normal and no neck mass is present.  Pulmonary: No respiratory distress and normal respiratory rhythm and effort.  Cardiovascular:. No peripheral edema.  Abdomen: Left CVA tenderness.  Skin: Normal skin turgor, no visible rash and no visible skin lesions.  Neuro/Psych:. Mood and affect are appropriate.     Assessment Assessed  1. Nephrolithiasis Left 592.0   We discussed the options at this point. The first would be to remove the stent and proceed with metabolic stone workup with no further treatment for the remaining stones other than continued follow up imaging. The second option would be to leave the stent in place and proceed with ureteroscopy and removal of remaining stones and the third option, which she has chosen, is to have the stent removed since it was causing a lot of irritation and then schedule her for an outpatient ureteroscopy and removal of the remaining stones which is what she has chosen to do.   Plan Health Maintenance (V70.0)  1. UA With REFLEX  Done: 07Nov2012 08:10AM Nephrolithiasis (592.0)  2. Stent Tracking Left 3. Follow-up Schedule Surgery Office  Follow-up  Requested for: 07Nov2012 Nephrolithiasis (592.0), Previous Stent Placement  4. Cysto Removal JJ(s)  Done: 07Nov2012      1. Her stent was removed at her last visit. 2. She was given a single dose of Cipro 500 mg to  cover her for her cystoscopy. 3. She presents today for ureteroscopy and laser lithotripsy of her remaining left renal calculi. 4. Once this has been completed she will return and undergo workup for her stone disease with a hypercalciuria profile and 24-hour urine study followed by followup with me to discuss these results.

## 2011-04-06 ENCOUNTER — Encounter (HOSPITAL_BASED_OUTPATIENT_CLINIC_OR_DEPARTMENT_OTHER): Payer: Self-pay | Admitting: Anesthesiology

## 2011-04-06 ENCOUNTER — Ambulatory Visit (HOSPITAL_BASED_OUTPATIENT_CLINIC_OR_DEPARTMENT_OTHER)
Admission: RE | Admit: 2011-04-06 | Discharge: 2011-04-06 | Disposition: A | Discharge status: Home / Self Care | Payer: BC Managed Care – PPO | Source: Ambulatory Visit | Attending: Urology | Admitting: Urology

## 2011-04-06 ENCOUNTER — Ambulatory Visit (HOSPITAL_COMMUNITY): Payer: BC Managed Care – PPO

## 2011-04-06 ENCOUNTER — Encounter (HOSPITAL_BASED_OUTPATIENT_CLINIC_OR_DEPARTMENT_OTHER): Payer: Self-pay | Admitting: *Deleted

## 2011-04-06 ENCOUNTER — Encounter (HOSPITAL_BASED_OUTPATIENT_CLINIC_OR_DEPARTMENT_OTHER)
Admission: RE | Disposition: A | Discharge status: Home / Self Care | Payer: Self-pay | Source: Ambulatory Visit | Attending: Urology

## 2011-04-06 ENCOUNTER — Ambulatory Visit (HOSPITAL_BASED_OUTPATIENT_CLINIC_OR_DEPARTMENT_OTHER): Payer: BC Managed Care – PPO | Admitting: Anesthesiology

## 2011-04-06 DIAGNOSIS — K219 Gastro-esophageal reflux disease without esophagitis: Secondary | ICD-10-CM | POA: Insufficient documentation

## 2011-04-06 DIAGNOSIS — N1330 Unspecified hydronephrosis: Secondary | ICD-10-CM | POA: Insufficient documentation

## 2011-04-06 DIAGNOSIS — N200 Calculus of kidney: Secondary | ICD-10-CM | POA: Diagnosis present

## 2011-04-06 DIAGNOSIS — Z79899 Other long term (current) drug therapy: Secondary | ICD-10-CM | POA: Insufficient documentation

## 2011-04-06 DIAGNOSIS — I10 Essential (primary) hypertension: Secondary | ICD-10-CM | POA: Insufficient documentation

## 2011-04-06 DIAGNOSIS — Z794 Long term (current) use of insulin: Secondary | ICD-10-CM | POA: Insufficient documentation

## 2011-04-06 DIAGNOSIS — E119 Type 2 diabetes mellitus without complications: Secondary | ICD-10-CM | POA: Insufficient documentation

## 2011-04-06 DIAGNOSIS — E7800 Pure hypercholesterolemia, unspecified: Secondary | ICD-10-CM | POA: Insufficient documentation

## 2011-04-06 HISTORY — DX: Restless legs syndrome: G25.81

## 2011-04-06 HISTORY — PX: URETEROSCOPY: SHX842

## 2011-04-06 HISTORY — DX: Insomnia, unspecified: G47.00

## 2011-04-06 HISTORY — DX: Sepsis, unspecified organism: A41.9

## 2011-04-06 HISTORY — DX: Erythematous condition, unspecified: L53.9

## 2011-04-06 HISTORY — DX: Sepsis, unspecified organism: N39.0

## 2011-04-06 HISTORY — DX: Polyneuropathy, unspecified: G62.9

## 2011-04-06 LAB — POCT I-STAT, CHEM 8
BUN: 16 mg/dL (ref 6–23)
Calcium, Ion: 1.22 mmol/L (ref 1.12–1.32)
Chloride: 107 mEq/L (ref 96–112)
Creatinine, Ser: 0.7 mg/dL (ref 0.50–1.10)
Glucose, Bld: 144 mg/dL — ABNORMAL HIGH (ref 70–99)
HCT: 38 % (ref 36.0–46.0)
Hemoglobin: 12.9 g/dL (ref 12.0–15.0)
Potassium: 3.9 mEq/L (ref 3.5–5.1)
Sodium: 141 mEq/L (ref 135–145)
TCO2: 22 mmol/L (ref 0–100)

## 2011-04-06 LAB — GLUCOSE, CAPILLARY: Glucose-Capillary: 95 mg/dL (ref 70–99)

## 2011-04-06 SURGERY — URETEROSCOPY
Anesthesia: General | Laterality: Left

## 2011-04-06 MED ORDER — EPHEDRINE SULFATE 50 MG/ML IJ SOLN
INTRAMUSCULAR | Status: DC | PRN
  Administered 2011-04-06 (×2): 10 mg via INTRAVENOUS

## 2011-04-06 MED ORDER — MIDAZOLAM HCL 5 MG/5ML IJ SOLN
INTRAMUSCULAR | Status: DC | PRN
  Administered 2011-04-06: 1 mg via INTRAVENOUS

## 2011-04-06 MED ORDER — CIPROFLOXACIN IN D5W 200 MG/100ML IV SOLN
200.0000 mg | INTRAVENOUS | Status: AC
  Administered 2011-04-06: 200 mg via INTRAVENOUS

## 2011-04-06 MED ORDER — PROPOFOL 10 MG/ML IV EMUL
INTRAVENOUS | Status: DC | PRN
  Administered 2011-04-06: 200 mg via INTRAVENOUS
  Administered 2011-04-06: 30 mg via INTRAVENOUS

## 2011-04-06 MED ORDER — HYDROCODONE-ACETAMINOPHEN 7.5-325 MG PO TABS: 1.0000 | ORAL_TABLET | ORAL | Status: AC | PRN

## 2011-04-06 MED ORDER — SODIUM CHLORIDE 0.9 % IR SOLN
Status: DC | PRN
  Administered 2011-04-06: 6000 mL

## 2011-04-06 MED ORDER — LIDOCAINE HCL (CARDIAC) 20 MG/ML IV SOLN
INTRAVENOUS | Status: DC | PRN
  Administered 2011-04-06: 60 mg via INTRAVENOUS

## 2011-04-06 MED ORDER — IOHEXOL 350 MG/ML SOLN
INTRAVENOUS | Status: DC | PRN
  Administered 2011-04-06: 50 mL via INTRATHECAL

## 2011-04-06 MED ORDER — PHENAZOPYRIDINE HCL 200 MG PO TABS: 200.0000 mg | ORAL_TABLET | Freq: Three times a day (TID) | ORAL | Status: AC | PRN

## 2011-04-06 MED ORDER — PROMETHAZINE HCL 25 MG/ML IJ SOLN
6.2500 mg | INTRAMUSCULAR | Status: DC | PRN
  Administered 2011-04-06: 6.25 mg via INTRAVENOUS

## 2011-04-06 MED ORDER — BELLADONNA ALKALOIDS-OPIUM 16.2-60 MG RE SUPP
RECTAL | Status: DC | PRN
  Administered 2011-04-06: 1 via RECTAL

## 2011-04-06 MED ORDER — LACTATED RINGERS IV SOLN
INTRAVENOUS | Status: DC
  Administered 2011-04-06 (×3): via INTRAVENOUS

## 2011-04-06 MED ORDER — PHENAZOPYRIDINE HCL 200 MG PO TABS
200.0000 mg | ORAL_TABLET | Freq: Once | ORAL | Status: AC
  Administered 2011-04-06: 200 mg via ORAL

## 2011-04-06 MED ORDER — FENTANYL CITRATE 0.05 MG/ML IJ SOLN: 25.0000 ug | INTRAMUSCULAR | Status: DC | PRN

## 2011-04-06 MED ORDER — FENTANYL CITRATE 0.05 MG/ML IJ SOLN
INTRAMUSCULAR | Status: DC | PRN
  Administered 2011-04-06: 25 ug via INTRAVENOUS
  Administered 2011-04-06: 50 ug via INTRAVENOUS
  Administered 2011-04-06: 25 ug via INTRAVENOUS

## 2011-04-06 MED ORDER — ONDANSETRON HCL 4 MG/2ML IJ SOLN
INTRAMUSCULAR | Status: DC | PRN
  Administered 2011-04-06: 4 mg via INTRAVENOUS

## 2011-04-06 SURGICAL SUPPLY — 34 items
ADAPTER CATH URET PLST 4-6FR (CATHETERS) ×2 IMPLANT
BAG DRAIN URO-CYSTO SKYTR STRL (DRAIN) ×2 IMPLANT
BASKET LASER NITINOL 1.9FR (BASKET) IMPLANT
BASKET STNLS GEMINI 4WIRE 3FR (BASKET) IMPLANT
BASKET ZERO TIP NITINOL 2.4FR (BASKET) ×2 IMPLANT
BRUSH URET BIOPSY 3F (UROLOGICAL SUPPLIES) IMPLANT
BSKT STON RTRVL GEM 120X11 3FR (BASKET)
BSKT STON RTRVL ZERO TP 2.4FR (BASKET) ×1
CANISTER SUCT LVC 12 LTR MEDI- (MISCELLANEOUS) ×2 IMPLANT
CATH INTERMIT  6FR 70CM (CATHETERS) IMPLANT
CATH URET 5FR 28IN CONE TIP (BALLOONS)
CATH URET 5FR 70CM CONE TIP (BALLOONS) IMPLANT
CLOTH BEACON ORANGE TIMEOUT ST (SAFETY) ×2 IMPLANT
DRAPE CAMERA CLOSED 9X96 (DRAPES) ×2 IMPLANT
ELECT REM PT RETURN 9FT ADLT (ELECTROSURGICAL)
ELECTRODE REM PT RTRN 9FT ADLT (ELECTROSURGICAL) IMPLANT
GLOVE BIO SURGEON STRL SZ8 (GLOVE) ×2 IMPLANT
GOWN PREVENTION PLUS LG XLONG (DISPOSABLE) ×2 IMPLANT
GOWN STRL REIN XL XLG (GOWN DISPOSABLE) ×2 IMPLANT
GUIDEWIRE 0.038 PTFE COATED (WIRE) IMPLANT
GUIDEWIRE ANG ZIPWIRE 038X150 (WIRE) IMPLANT
GUIDEWIRE STR DUAL SENSOR (WIRE) ×2 IMPLANT
IV NS IRRIG 3000ML ARTHROMATIC (IV SOLUTION) ×4 IMPLANT
KIT BALLIN UROMAX 15FX10 (LABEL) IMPLANT
KIT BALLN UROMAX 15FX4 (MISCELLANEOUS) IMPLANT
KIT BALLN UROMAX 26 75X4 (MISCELLANEOUS)
LASER FIBER DISP (UROLOGICAL SUPPLIES) ×2 IMPLANT
PACK CYSTOSCOPY (CUSTOM PROCEDURE TRAY) ×2 IMPLANT
SET HIGH PRES BAL DIL (LABEL)
SHEATH ACCESS URETERAL 38CM (SHEATH) ×2 IMPLANT
SHEATH URET ACCESS 12FR/35CM (UROLOGICAL SUPPLIES) IMPLANT
SHEATH URET ACCESS 12FR/55CM (UROLOGICAL SUPPLIES) IMPLANT
STENT POLARIS 5FRX24X.035 (STENTS) ×2 IMPLANT
WATER STERILE IRR 3000ML UROMA (IV SOLUTION) IMPLANT

## 2011-04-06 NOTE — Anesthesia Preprocedure Evaluation (Signed)
Anesthesia Evaluation  Patient identified by MRN, date of birth, ID band Patient awake    Reviewed: Allergy & Precautions, H&P , NPO status , Patient's Chart, lab work & pertinent test results  Airway Mallampati: II TM Distance: >3 FB Neck ROM: Full    Dental No notable dental hx.  Missing many rear teeth:   Pulmonary neg pulmonary ROS, shortness of breath, Current Smoker,  clear to auscultation  Pulmonary exam normal       Cardiovascular hypertension, Pt. on home beta blockers and Pt. on medications +CHF and neg cardio ROS Regular Normal Clearance Dr. Graciela Husbands; expects tachycardia   Neuro/Psych  Neuromuscular disease Negative Neurological ROS  Negative Psych ROS   GI/Hepatic negative GI ROS, Neg liver ROS,   Endo/Other  Negative Endocrine ROSDiabetes mellitus-, Insulin Dependent  Renal/GU negative Renal ROS  Genitourinary negative   Musculoskeletal negative musculoskeletal ROS (+)   Abdominal (+) obese,   Peds negative pediatric ROS (+)  Hematology negative hematology ROS (+)   Anesthesia Other Findings   Reproductive/Obstetrics negative OB ROS                           Anesthesia Physical Anesthesia Plan  ASA: III  Anesthesia Plan: General   Post-op Pain Management:    Induction: Intravenous  Airway Management Planned: LMA  Additional Equipment:   Intra-op Plan:   Post-operative Plan: Extubation in OR  Informed Consent: I have reviewed the patients History and Physical, chart, labs and discussed the procedure including the risks, benefits and alternatives for the proposed anesthesia with the patient or authorized representative who has indicated his/her understanding and acceptance.   Dental advisory given  Plan Discussed with: CRNA  Anesthesia Plan Comments:         Anesthesia Quick Evaluation

## 2011-04-06 NOTE — Anesthesia Procedure Notes (Signed)
Procedure Name: LMA Insertion Date/Time: 04/06/2011 9:35 AM Performed by: Lorrin Jackson Pre-anesthesia Checklist: Patient identified, Emergency Drugs available, Suction available and Patient being monitored Patient Re-evaluated:Patient Re-evaluated prior to inductionOxygen Delivery Method: Circle System Utilized Preoxygenation: Pre-oxygenation with 100% oxygen Intubation Type: IV induction Ventilation: Mask ventilation without difficulty LMA: LMA with gastric port inserted LMA Size: 4.0 Number of attempts: 1 Placement Confirmation: positive ETCO2 Tube secured with: Tape Dental Injury: Teeth and Oropharynx as per pre-operative assessment

## 2011-04-06 NOTE — Anesthesia Postprocedure Evaluation (Signed)
  Anesthesia Post-op Note  Patient: Lacey [name]  Procedure(s) Performed:  URETEROSCOPY - L Ureteroscopy Laser Litho & Stent C-arm, Camera, Digital Ureteroscope, Holmium Laser needed  Patient Location: PACU  Anesthesia Type: General  Level of Consciousness: awake and alert   Airway and Oxygen Therapy: Patient Spontanous Breathing  Post-op Pain: mild  Post-op Assessment: Post-op Vital signs reviewed, Patient's Cardiovascular Status Stable, Respiratory Function Stable, Patent Airway and No signs of Nausea or vomiting  Post-op Vital Signs: stable  Complications: No apparent anesthesia complications

## 2011-04-06 NOTE — Transfer of Care (Signed)
Immediate Anesthesia Transfer of Care Note  Patient: Lacey [name]  Procedure(s) Performed:  URETEROSCOPY - L Ureteroscopy Laser Litho & Stent C-arm, Camera, Digital Ureteroscope, Holmium Laser needed  Patient Location: Patient transported to PACU with oxygen via face mask at 6 Liters / Min  Anesthesia Type: General  Level of Consciousness: awake and alert   Airway & Oxygen Therapy: Patient Spontanous Breathing and Patient connected to face mask oxygen Post-op Assessment: Report given to PACU RN and Post -op Vital signs reviewed and stable  Post vital signs: Reviewed and stable  Complications: No apparent anesthesia complications

## 2011-04-06 NOTE — Op Note (Signed)
PATIENT:  Mendel Corning  PRE-OPERATIVE DIAGNOSIS:  Left renal calcului  POST-OPERATIVE DIAGNOSIS:  Same  PROCEDURE: 1. Cystoscopy with left retrograde pyelogram including interpretation. 2. Left ureteroscopy with laser lithotripsy and stone extraction. 3. Left ureteral stent placement.  SURGEON:  Surgeon: Garnett Farm  INDICATION: She had left renal calculi and that were treated with a percutaneous nephrostolithotomy. I was able to clear the majority of her stones however there was a stone in a parallel calyx and also a second calcification appears to be in the kidney as well. We discussed the treatment options and she has elected to proceed with ureteroscopic treatment of the remaining calculi.  ANESTHESIA:  General  EBL:  Minimal  DRAINS: 5 French, 24 cm Polaris stent (no string)  SPECIMEN:  Stone fragments  DISPOSITION OF SPECIMEN:  Given the patient  DESCRIPTION OF PROCEDURE: The patient was taken to the major OR and placed on the table. General anesthesia was administered and then the patient was moved to the dorsal lithotomy position. The genitalia was sterilely prepped and draped. An official timeout was performed.  Initially the 22 French cystoscope with 12 lens was passed under direct vision was passed into the bladder. The bladder was then entered and fully inspected. It was noted be free of any tumors stones or inflammatory lesions. Ureteral orifices were of normal configuration and position. A 6 French open-ended ureteral catheter was then passed through the cystoscope into the ureteral orifice in order to perform a left retrograde pyelogram.  A retrograde pyelogram was performed by injecting full-strength contrast up the left ureter under direct fluoroscopic control. It revealed no ureteral filling defects or abnormalities. The previously noted calculi seen on her KUB were identified as filling defects within the kidney.I then passed a 0.038 inch floppy-tipped guidewire  through the open ended catheter and into the area of the renal pelvis and this was left in place. The inner portion of a ureteral access sheath was then passed over the guidewire to gently dilate the intramural ureter. I then placed the outer portion of the access sheath over the inner portion, passed this over the guidewire into the area of the ureteropelvic junction and then removed the inner portion as well as guidewire. I then proceeded with ureteroscopy.  A 6 French flexible ureteroscope was then passed under direct into the bladder and into the left orifice and up the ureter. The stones we identified and I felt they were too large to extract and therefore elected to proceed with laser lithotripsy. The 200  holmium laser fiber was used to fragment the stone. I then used the nitinol basket to extract all of the stone fragments and reinspection of the renal pelvis and each calyx revealed no further stone fragments larger than submillimeter size and no injury to the ureter or intrarenal collecting system was noted. Ureteroscope was then removed and as was the ureteral access sheath after a guidewire had been passed into the area the renal pelvis under direct fluoroscopic control. I then backloaded the cystoscope over the guidewire and passed the stent over the guidewire into the area of the renal pelvis. As the guidewire was removed good curl was noted in the renal pelvis. The bladder was drained and the cystoscope was then removed. The patient tolerated the procedure well no intraoperative complications.  PLAN OF CARE: Discharge to home after PACU  PATIENT DISPOSITION:  PACU - hemodynamically stable.

## 2011-04-16 ENCOUNTER — Ambulatory Visit (INDEPENDENT_AMBULATORY_CARE_PROVIDER_SITE_OTHER): Payer: BC Managed Care – PPO | Admitting: General Surgery

## 2011-04-16 ENCOUNTER — Encounter (INDEPENDENT_AMBULATORY_CARE_PROVIDER_SITE_OTHER): Payer: Self-pay | Admitting: General Surgery

## 2011-04-16 ENCOUNTER — Other Ambulatory Visit (INDEPENDENT_AMBULATORY_CARE_PROVIDER_SITE_OTHER): Payer: Self-pay | Admitting: General Surgery

## 2011-04-16 ENCOUNTER — Encounter: Payer: Self-pay | Admitting: Family Medicine

## 2011-04-16 ENCOUNTER — Ambulatory Visit (INDEPENDENT_AMBULATORY_CARE_PROVIDER_SITE_OTHER): Payer: BC Managed Care – PPO | Admitting: Family Medicine

## 2011-04-16 VITALS — BP 142/70 | HR 100 | Temp 97.9°F | Resp 20 | Ht 62.0 in | Wt 224.4 lb

## 2011-04-16 VITALS — BP 120/90 | HR 88 | Temp 97.7°F | Ht 62.0 in | Wt 224.0 lb

## 2011-04-16 DIAGNOSIS — L02219 Cutaneous abscess of trunk, unspecified: Secondary | ICD-10-CM

## 2011-04-16 DIAGNOSIS — L02211 Cutaneous abscess of abdominal wall: Secondary | ICD-10-CM

## 2011-04-16 MED ORDER — DOXYCYCLINE HYCLATE 100 MG PO TABS: 100.0000 mg | ORAL_TABLET | Freq: Two times a day (BID) | ORAL | Status: AC

## 2011-04-16 NOTE — Patient Instructions (Signed)
Keep covered with gauze and change this as needed (at least daily). Remove packing strip Saturday.

## 2011-04-16 NOTE — Progress Notes (Signed)
Addended by: Latricia Heft on: 04/16/2011 03:27 PM   Modules accepted: Orders

## 2011-04-16 NOTE — Patient Instructions (Signed)
Please arrive at 3:00 today to see Dr Janee Morn (he's great!) They will take care of this nonsense once and for all Call with any questions or concerns Hang in there! Happy Holidays!

## 2011-04-16 NOTE — Assessment & Plan Note (Signed)
Pt's abscess now visible on the abdominal wall.  Due to fact that this will likely need packing will refer to surgery for definitive tx.  Pt scheduled for 3:00 this afternoon.  Reviewed supportive care and red flags that should prompt return.  Pt expressed understanding and is in agreement w/ plan.

## 2011-04-16 NOTE — Progress Notes (Signed)
Subjective:     Patient ID: Lacey [name], female   DOB: 1951-04-03, 61 y.o.   MRN: [id number]  HPI The patient has a two-week history of abdominal abscess. She is a diabetic. THis developed at the site of insulin injections. It is not resolved with antibiotic ointment. I spoke with Dr. Beverely Low today h.s. to see her in consultation for possible incision and drainage of this abdominal wall abscess. The patient completed her most recent course of doxycycline  Review of Systems  Constitutional: Negative for fever, chills and unexpected weight change.  HENT: Negative for hearing loss, congestion, sore throat, trouble swallowing and voice change.   Eyes: Negative for visual disturbance.  Respiratory: Negative for cough and wheezing.   Cardiovascular: Negative for chest pain, palpitations and leg swelling.  Gastrointestinal: Negative for nausea, vomiting, abdominal pain, diarrhea, constipation, blood in stool, abdominal distention and anal bleeding.  Genitourinary: Negative for hematuria, vaginal bleeding and difficulty urinating.  Musculoskeletal: Positive for arthralgias.  Skin: Positive for wound. Negative for rash.  Neurological: Negative for seizures, syncope and headaches.  Hematological: Negative for adenopathy. Does not bruise/bleed easily.  Psychiatric/Behavioral: Negative for confusion.       Objective:   Physical Exam  Cardiovascular: Normal rate, regular rhythm and normal heart sounds.   Pulmonary/Chest: Effort normal and breath sounds normal.  Abdominal: Soft. She exhibits no distension. There is tenderness. There is no rebound.       2x3 CM abscess R abdominal wall with fluctuance   The area was prepped in sterile fashion. 1% lidocaine with epinephrine was injected. A transverse incision was made with prompt return of a large volume of pus. Cultures were taken. The wound was cleaned out and packed with iodoform gauze. A bulky gauze dressing was applied.    Assessment:       Abdominal wall abscess    Plan:     This was incised and drained as above. She will leave the packing in until Saturday. Wound care instructions were given. I gave her prescription for doxycycline. We will check her cultures. We will see her back next week.

## 2011-04-16 NOTE — Progress Notes (Signed)
  Subjective:    Patient ID: Lacey [name], female    DOB: Oct 16, 1950, 60 y.o.   MRN: [id number]  HPI Abdominal abscess- has completed course of doxy.  This improved the surrounding redness but did nothing to resolve the induration.  Has been very painful x3 days.  No drainage   Review of Systems For ROS see HPI     Objective:   Physical Exam  Vitals reviewed. Constitutional: She appears well-developed and well-nourished. No distress.  Skin: Skin is warm and dry.       2 cm abscess on R abdominal wall.  + TTP, fluctuance.          Assessment & Plan:

## 2011-04-19 LAB — WOUND CULTURE
Gram Stain: NONE SEEN
Organism ID, Bacteria: NO GROWTH

## 2011-04-20 ENCOUNTER — Encounter (HOSPITAL_BASED_OUTPATIENT_CLINIC_OR_DEPARTMENT_OTHER): Payer: Self-pay | Admitting: Urology

## 2011-04-22 ENCOUNTER — Ambulatory Visit (INDEPENDENT_AMBULATORY_CARE_PROVIDER_SITE_OTHER): Payer: BC Managed Care – PPO | Admitting: General Surgery

## 2011-04-22 ENCOUNTER — Encounter (INDEPENDENT_AMBULATORY_CARE_PROVIDER_SITE_OTHER): Payer: Self-pay | Admitting: General Surgery

## 2011-04-22 VITALS — BP 129/88 | HR 78 | Temp 98.1°F | Resp 16 | Ht 62.0 in | Wt 221.8 lb

## 2011-04-22 DIAGNOSIS — L03319 Cellulitis of trunk, unspecified: Secondary | ICD-10-CM

## 2011-04-22 DIAGNOSIS — L02211 Cutaneous abscess of abdominal wall: Secondary | ICD-10-CM

## 2011-04-22 NOTE — Progress Notes (Signed)
Subjective:     Patient ID: Lacey [name], female   DOB: [birth year], 60 y.o.   MRN: [id number]  HPI Patient underwent incision and drainage of abdominal abscess last week. She is doing well. She has had minimal drainage. Her packing came out over the weekend   Review of Systems     Objective:   Physical Exam Incision site is opened with clean granulation tissue, there is no residual induration, there is no cellulitis. She has had some small tape blisters which are healing    Assessment:        Improving status post incision and drainage of abdominal wall abscess Plan:     Complete course of antibiotics and we'll see her as needed

## 2011-05-05 HISTORY — PX: OTHER SURGICAL HISTORY: SHX169

## 2011-06-22 ENCOUNTER — Ambulatory Visit (INDEPENDENT_AMBULATORY_CARE_PROVIDER_SITE_OTHER): Payer: BC Managed Care – PPO | Admitting: Family Medicine

## 2011-06-22 ENCOUNTER — Encounter: Payer: Self-pay | Admitting: Family Medicine

## 2011-06-22 DIAGNOSIS — M792 Neuralgia and neuritis, unspecified: Secondary | ICD-10-CM

## 2011-06-22 DIAGNOSIS — E119 Type 2 diabetes mellitus without complications: Secondary | ICD-10-CM

## 2011-06-22 DIAGNOSIS — Z0000 Encounter for general adult medical examination without abnormal findings: Secondary | ICD-10-CM

## 2011-06-22 DIAGNOSIS — G5790 Unspecified mononeuropathy of unspecified lower limb: Secondary | ICD-10-CM

## 2011-06-22 DIAGNOSIS — E785 Hyperlipidemia, unspecified: Secondary | ICD-10-CM

## 2011-06-22 DIAGNOSIS — I110 Hypertensive heart disease with heart failure: Secondary | ICD-10-CM

## 2011-06-22 DIAGNOSIS — I509 Heart failure, unspecified: Secondary | ICD-10-CM

## 2011-06-22 LAB — BASIC METABOLIC PANEL
CO2: 26 mEq/L (ref 19–32)
Calcium: 9.4 mg/dL (ref 8.4–10.5)
Creatinine, Ser: 0.6 mg/dL (ref 0.4–1.2)
GFR: 103.97 mL/min (ref >60.00)
Sodium: 140 mEq/L (ref 135–145)

## 2011-06-22 LAB — HEMOGLOBIN A1C: Hgb A1c MFr Bld: 7.7 % — ABNORMAL HIGH (ref 4.6–6.5)

## 2011-06-22 LAB — CBC WITH DIFFERENTIAL/PLATELET
Basophils Absolute: 0.1 10*3/uL (ref 0.0–0.1)
Eosinophils Absolute: 0.2 10*3/uL (ref 0.0–0.7)
HCT: 37.1 % (ref 36.0–46.0)
Lymphs Abs: 1.9 10*3/uL (ref 0.7–4.0)
MCHC: 33.3 g/dL (ref 30.0–36.0)
Monocytes Relative: 6 % (ref 3.0–12.0)
Neutro Abs: 4.3 10*3/uL (ref 1.4–7.7)
Platelets: 184 10*3/uL (ref 150.0–400.0)
RDW: 14.7 % — ABNORMAL HIGH (ref 11.5–14.6)

## 2011-06-22 LAB — HEPATIC FUNCTION PANEL
Alkaline Phosphatase: 70 U/L (ref 39–117)
Bilirubin, Direct: 0 mg/dL (ref 0.0–0.3)
Total Bilirubin: 0.3 mg/dL (ref 0.3–1.2)
Total Protein: 7 g/dL (ref 6.0–8.3)

## 2011-06-22 LAB — LIPID PANEL
Cholesterol: 123 mg/dL (ref 0–200)
HDL: 37.3 mg/dL — ABNORMAL LOW (ref >39.00)
Total CHOL/HDL Ratio: 3
Triglycerides: 280 mg/dL — ABNORMAL HIGH (ref 0.0–149.0)
VLDL: 56 mg/dL — ABNORMAL HIGH (ref 0.0–40.0)

## 2011-06-22 MED ORDER — TETANUS-DIPHTH-ACELL PERTUSSIS 5-2.5-18.5 LF-MCG/0.5 IM SUSP
0.5000 mL | Freq: Once | INTRAMUSCULAR | Status: AC
  Administered 2011-06-22: 0.5 mL via INTRAMUSCULAR

## 2011-06-22 NOTE — Progress Notes (Signed)
  Subjective:    Patient ID: Lacey [name], female    DOB: 28-May-1950, 61 y.o.   MRN: [id number]  HPI CPE- UTD on colonoscopy, GYN screening.  Overdue on Tdap  DM- chronic problem.  Reports CBGs are labile.  This AM- 221.  Taking insulin as directed, watching diet.  Upset by elevated sugars.  UTD on eye exam.  Denies symptomatic lows.  Complains of sweats for 'a long time'.  HTN- chronic problem, adequate control.  On Diovan HCT, metoprolol.  Continues to have CP, SOB- but no more than usual.  Has seen cards.  no HAs, visual changes, edema.  Hyperlipidemia- chronic problem.  On Trilipix and lipitor.  Hx of good control.  Denies abd pain, N/V, myalgias.    Foot numbness- R foot, will have pain throughout the day.  Numbness will travel down side of leg and wrap under the foot.  'it's a numbness that hurts'.  Seeing Dr Ethelene Hal for neck.  Was started on Klonopin for presumed RLS w/ some relief.   Review of Systems For ROS see HPI     Objective:   Physical Exam General Appearance:    Alert, cooperative, no distress, appears stated age  Head:    Normocephalic, without obvious abnormality, atraumatic  Eyes:    PERRL, conjunctiva/corneas clear, EOM's intact, fundi    benign, both eyes  Ears:    Normal TM's and external ear canals, both ears  Nose:   Nares normal, septum midline, mucosa normal, no drainage    or sinus tenderness  Throat:   Lips, mucosa, and tongue normal; teeth and gums normal  Neck:   Supple, symmetrical, trachea midline, no adenopathy;    Thyroid: no enlargement/tenderness/nodules  Back:     Symmetric, no curvature, ROM normal, no CVA tenderness  Lungs:     Clear to auscultation bilaterally, respirations unlabored  Chest Wall:    No tenderness or deformity   Heart:    Regular rate and rhythm, S1 and S2 normal, no murmur, rub   or gallop  Breast Exam:    Deferred to GYN  Abdomen:     Soft, non-tender, bowel sounds active all four quadrants,    no masses, no organomegaly    Genitalia:    Deferred to GYN  Rectal:    Extremities:   Extremities normal, atraumatic, no cyanosis or edema  Pulses:   2+ and symmetric all extremities  Skin:   Skin color, texture, turgor normal, no rashes or lesions  Lymph nodes:   Cervical, supraclavicular, and axillary nodes normal  Neurologic:   CNII-XII intact, normal strength, sensation and reflexes    throughout          Assessment & Plan:

## 2011-06-22 NOTE — Patient Instructions (Signed)
Follow up in 3 months to recheck diabetes We'll notify you of your lab results and make any changes if needed Tell Dr Ethelene Hal about your leg/foot numbness If you have worsening chest pain or shortness of breath- please go to the ER Call with any questions or concerns Hang in there!!!

## 2011-06-24 LAB — VITAMIN D 1,25 DIHYDROXY: Vitamin D2 1, 25 (OH)2: 12 pg/mL

## 2011-07-05 NOTE — Assessment & Plan Note (Signed)
Chronic problem.  Fair control.  Pt continues to have intermittent chest pain but cards doesn't feel this is anything to worry about.  No changes at this time.  Will follow closely.

## 2011-07-05 NOTE — Assessment & Plan Note (Signed)
New.  RLS vs neuropathic pain- possibly from DDD or DM.  Klonopin started at last visit has improved sxs somewhat but pt still struggling w/ this.  Encouraged her to talk w/ Dr Ethelene Hal about her sxs since she is already seeing him for her neck.  She is in agreement w/ this.  Will continue to follow.

## 2011-07-05 NOTE — Assessment & Plan Note (Signed)
Chronic problem.  Tolerating meds w/out difficulty.  Check labs.  Adjust meds prn  

## 2011-07-05 NOTE — Assessment & Plan Note (Signed)
Pt's PE WNL.  UTD on health maintenance.  Check labs.  Anticipatory guidance provided.  

## 2011-07-05 NOTE — Assessment & Plan Note (Signed)
Deteriorated.  Pt's CBGs much higher than previous despite pt's reported compliance w/ meds.  Stressed importance of healthy diet and regular exercise.  Check labs.  Adjust meds prn.  If A1C is too high may need endo referral.  Will follow closely.

## 2011-07-22 ENCOUNTER — Ambulatory Visit: Payer: BC Managed Care – PPO | Admitting: Internal Medicine

## 2011-07-22 ENCOUNTER — Encounter: Payer: Self-pay | Admitting: Family Medicine

## 2011-07-22 ENCOUNTER — Ambulatory Visit (INDEPENDENT_AMBULATORY_CARE_PROVIDER_SITE_OTHER): Payer: BC Managed Care – PPO | Admitting: Family Medicine

## 2011-07-22 VITALS — BP 134/72 | HR 92 | Wt 228.8 lb

## 2011-07-22 DIAGNOSIS — M25579 Pain in unspecified ankle and joints of unspecified foot: Secondary | ICD-10-CM

## 2011-07-22 DIAGNOSIS — M79609 Pain in unspecified limb: Secondary | ICD-10-CM

## 2011-07-22 DIAGNOSIS — M25476 Effusion, unspecified foot: Secondary | ICD-10-CM

## 2011-07-22 DIAGNOSIS — M79672 Pain in left foot: Secondary | ICD-10-CM

## 2011-07-22 DIAGNOSIS — M25572 Pain in left ankle and joints of left foot: Secondary | ICD-10-CM

## 2011-07-22 DIAGNOSIS — M25472 Effusion, left ankle: Secondary | ICD-10-CM

## 2011-07-22 NOTE — Patient Instructions (Signed)
Ankle Pain  Ankle pain is a common symptom. The bones, cartilage, tendons, and muscles of the ankle joint perform a lot of work each day. The ankle joint holds your body weight and allows you to move around. Ankle pain can occur on either side or back of 1 or both ankles. Ankle pain may be sharp and burning or dull and aching. There may be tenderness, stiffness, redness, or warmth around the ankle. The pain occurs more often when a person walks or puts pressure on the ankle.  CAUSES   There are many reasons ankle pain can develop. It is important to work with your caregiver to identify the cause since many conditions can impact the bones, cartilage, muscles, and tendons. Causes for ankle pain include:  · Injury, including a break (fracture), sprain, or strain often due to a fall, sports, or a high-impact activity.  · Swelling (inflammation) of a tendon (tendonitis).  · Achilles tendon rupture.  · Ankle instability after repeated sprains and strains.  · Poor foot alignment.  · Pressure on a nerve (tarsal tunnel syndrome).  · Arthritis in the ankle or the lining of the ankle.  · Crystal formation in the ankle (gout or pseudogout).  DIAGNOSIS   A diagnosis is based on your medical history, your symptoms, results of your physical exam, and results of diagnostic tests. Diagnostic tests may include X-ray exams or a computerized magnetic scan (magnetic resonance imaging, MRI).  TREATMENT   Treatment will depend on the cause of your ankle pain and may include:  · Keeping pressure off the ankle and limiting activities.  · Using crutches or other walking support (a cane or brace).  · Using rest, ice, compression, and elevation.  · Participating in physical therapy or home exercises.  · Wearing shoe inserts or special shoes.  · Losing weight.  · Taking medications to reduce pain or swelling or receiving an injection.  · Undergoing surgery.  HOME CARE INSTRUCTIONS   · Only take over-the-counter or prescription medicines for  pain, discomfort, or fever as directed by your caregiver.  · Put ice on the injured area.  · Put ice in a plastic bag.  · Place a towel between your skin and the bag.  · Leave the ice on for 15 to 20 minutes at a time, 3 to 4 times a day.  · Keep your leg raised (elevated) when possible to lessen swelling.  · Avoid activities that cause ankle pain.  · Follow specific exercises as directed by your caregiver.  · Record how often you have ankle pain, the location of the pain, and what it feels like. This information may be helpful to you and your caregiver.  · Ask your caregiver about returning to work or sports and whether you should drive.  · Follow up with your caregiver for further examination, therapy, or testing as directed.  SEEK MEDICAL CARE IF:   · Pain or swelling continues or worsens beyond 1 week.  · You have an oral temperature above 102° F (38.9° C).  · You are feeling unwell or have chills.  · You are having an increasingly difficult time with walking.  · You have loss of sensation or other new symptoms.  · You have questions or concerns.  MAKE SURE YOU:   · Understand these instructions.  · Will watch your condition.  · Will get help right away if you are not doing well or get worse.  Document Released: 10/08/2009 Document Revised: 04/09/2011 Document   Reviewed: 10/08/2009  [employer]® Patient Information ©2012 [employer].

## 2011-07-22 NOTE — Progress Notes (Signed)
  Subjective:    Lacey [name] is a 61 y.o. female who presents for evaluation of edema in the left ankle and foot. The edema has been mild. Onset of symptoms was 1 day ago, and patient reports symptoms have stabilized since that time. The edema is present all day. The patient states the problem is new. The swelling has been aggravated by dependency of involved area. The swelling has been relieved by nothing. Associated factors include: nothing. Cardiac risk factors: none.  The following portions of the patient's history were reviewed and updated as appropriate: allergies, current medications, past family history, past medical history, past social history, past surgical history and problem list.  Review of Systems Pertinent items are noted in HPI.   Objective:    BP 134/72  Pulse 92  Wt 228 lb 12.8 oz (103.783 kg)  SpO2 95%  LMP 06/06/1993 General appearance: alert, cooperative, appears stated age and no distress Extremities: L foot and ankle--- + pitting edema ankle and foot, good pulses   Cardiographics ECG: not done  Imaging Chest x-ray: not indicated   Assessment:     Edema secondary to ankle/ foot injury.    Plan:  Air cast Elevate Rest Xray Pt did not want any pain meds  Recommendations: decrease sodium in the diet, elevate feet above the level of the heart whenever possible and use of compression stockings. The patient was also instructed to call IMMEDIATELY (i.e., day or night) if any cardiopulmonary symptoms occur, especially chest pain, shortness of breath, dyspnea on exertion, paroxysmal nocturnal dyspnea, or orthopnea, and these were explained. Follow up in 2 weeks and as needed. 2

## 2011-07-23 ENCOUNTER — Ambulatory Visit
Admission: RE | Admit: 2011-07-23 | Discharge: 2011-07-23 | Disposition: A | Discharge status: Home / Self Care | Payer: BC Managed Care – PPO | Source: Ambulatory Visit | Attending: Family Medicine | Admitting: Family Medicine

## 2011-07-23 ENCOUNTER — Telehealth: Payer: Self-pay | Admitting: *Deleted

## 2011-07-23 DIAGNOSIS — M79672 Pain in left foot: Secondary | ICD-10-CM

## 2011-07-23 DIAGNOSIS — M25572 Pain in left ankle and joints of left foot: Secondary | ICD-10-CM

## 2011-07-23 LAB — BASIC METABOLIC PANEL
BUN: 16 mg/dL (ref 6–23)
CO2: 22 mEq/L (ref 19–32)
Chloride: 99 mEq/L (ref 96–112)
Creatinine, Ser: 0.8 mg/dL (ref 0.4–1.2)
Glucose, Bld: 151 mg/dL — ABNORMAL HIGH (ref 70–99)
Potassium: 4.1 mEq/L (ref 3.5–5.1)

## 2011-07-23 LAB — URIC ACID: Uric Acid, Serum: 5.1 mg/dL (ref 2.4–7.0)

## 2011-07-23 NOTE — Telephone Encounter (Signed)
Message copied by Ovidio Kin on Thu Jul 23, 2011  4:35 PM ------      Message from: MCDANIELS, [place] R      Created: Thu Jul 23, 2011  3:03 PM      Contact: [facility]       Returned your call please call at [phone] as patient states she is not at home right now      Thanks

## 2011-07-23 NOTE — Telephone Encounter (Signed)
Unable to leave message with pt direct vm, only general mailbox vm available, .left message to have patient return my call on home number

## 2011-07-24 ENCOUNTER — Telehealth: Payer: Self-pay | Admitting: Family Medicine

## 2011-07-24 NOTE — Telephone Encounter (Signed)
Patient is returning Kim's call in reference to xray results. Call back # [phone]

## 2011-07-24 NOTE — Telephone Encounter (Signed)
Discuss with patient,  Neg ankle

## 2011-07-28 NOTE — Progress Notes (Signed)
Addended by: Derry Lory A on: 07/28/2011 10:27 AM   Modules accepted: Orders

## 2011-08-13 ENCOUNTER — Emergency Department (HOSPITAL_COMMUNITY): Payer: BC Managed Care – PPO

## 2011-08-13 ENCOUNTER — Encounter (HOSPITAL_COMMUNITY): Payer: Self-pay | Admitting: Nurse Practitioner

## 2011-08-13 ENCOUNTER — Emergency Department (HOSPITAL_COMMUNITY)
Admission: EM | Admit: 2011-08-13 | Discharge: 2011-08-13 | Disposition: A | Discharge status: Home / Self Care | Payer: BC Managed Care – PPO | Attending: Emergency Medicine | Admitting: Emergency Medicine

## 2011-08-13 ENCOUNTER — Telehealth: Payer: Self-pay | Admitting: *Deleted

## 2011-08-13 DIAGNOSIS — Z7982 Long term (current) use of aspirin: Secondary | ICD-10-CM | POA: Insufficient documentation

## 2011-08-13 DIAGNOSIS — R079 Chest pain, unspecified: Secondary | ICD-10-CM | POA: Insufficient documentation

## 2011-08-13 DIAGNOSIS — Z794 Long term (current) use of insulin: Secondary | ICD-10-CM | POA: Insufficient documentation

## 2011-08-13 DIAGNOSIS — I10 Essential (primary) hypertension: Secondary | ICD-10-CM | POA: Insufficient documentation

## 2011-08-13 DIAGNOSIS — E119 Type 2 diabetes mellitus without complications: Secondary | ICD-10-CM | POA: Insufficient documentation

## 2011-08-13 DIAGNOSIS — E785 Hyperlipidemia, unspecified: Secondary | ICD-10-CM | POA: Insufficient documentation

## 2011-08-13 DIAGNOSIS — Z79899 Other long term (current) drug therapy: Secondary | ICD-10-CM | POA: Insufficient documentation

## 2011-08-13 LAB — CBC
HCT: 36.1 % (ref 36.0–46.0)
Hemoglobin: 11.7 g/dL — ABNORMAL LOW (ref 12.0–15.0)
MCHC: 32.4 g/dL (ref 30.0–36.0)
MCV: 91.6 fL (ref 78.0–100.0)
RDW: 14.2 % (ref 11.5–15.5)

## 2011-08-13 LAB — DIFFERENTIAL
Basophils Absolute: 0.1 10*3/uL (ref 0.0–0.1)
Lymphocytes Relative: 28 % (ref 12–46)
Lymphs Abs: 2.6 10*3/uL (ref 0.7–4.0)
Neutro Abs: 5.4 10*3/uL (ref 1.7–7.7)

## 2011-08-13 LAB — COMPREHENSIVE METABOLIC PANEL
ALT: 26 U/L (ref 0–35)
AST: 25 U/L (ref 0–37)
Albumin: 3.7 g/dL (ref 3.5–5.2)
Alkaline Phosphatase: 76 U/L (ref 39–117)
CO2: 22 mEq/L (ref 19–32)
Chloride: 103 mEq/L (ref 96–112)
Potassium: 4 mEq/L (ref 3.5–5.1)
Sodium: 139 mEq/L (ref 135–145)
Total Bilirubin: 0.2 mg/dL — ABNORMAL LOW (ref 0.3–1.2)

## 2011-08-13 LAB — TROPONIN I: Troponin I: <0.3 ng/mL (ref <0.30)

## 2011-08-13 LAB — POCT I-STAT TROPONIN I

## 2011-08-13 MED ORDER — SODIUM CHLORIDE 0.9 % IV BOLUS (SEPSIS)
1000.0000 mL | Freq: Once | INTRAVENOUS | Status: AC
  Administered 2011-08-13: 1000 mL via INTRAVENOUS

## 2011-08-13 MED ORDER — NITROGLYCERIN 0.4 MG SL SUBL
0.4000 mg | SUBLINGUAL_TABLET | SUBLINGUAL | Status: DC | PRN
  Administered 2011-08-13: 0.4 mg via SUBLINGUAL

## 2011-08-13 NOTE — Telephone Encounter (Signed)
Pt seen in ED 

## 2011-08-13 NOTE — ED Notes (Signed)
Pt states chest pain is constant and sharp - unchanging with movement.

## 2011-08-13 NOTE — ED Provider Notes (Signed)
History     CSN: [id number]  Arrival date & time 08/13/11  1053   First MD Initiated Contact with Patient 08/13/11 1127      Chief Complaint  Patient presents with  . Chest Pain    (Consider location/radiation/quality/duration/timing/severity/associated sxs/prior treatment) HPI Comments: Chest discomfort off and on for the past three days.  Worse this morning and is more constant.  Patient reports normal heart cath by Dr. Donnie Aho at least five years ago, maybe longer.  Patient is a 61 y.o. female presenting with chest pain. The history is provided by the patient.  Chest Pain The chest pain began 3 - 5 hours ago. Chest pain occurs constantly. The chest pain is unchanged. Associated with: nothing. At its most intense, the pain is at 6/10. The pain is currently at 6/10. The severity of the pain is moderate. The quality of the pain is described as pressure-like. The pain radiates to the left shoulder. Exacerbated by: nothing. Pertinent negatives for primary symptoms include no fever, no cough, no palpitations and no nausea. She tried nothing for the symptoms. Risk factors: tobacco, DM, HTN, High Cholesterol, Family History.     Past Medical History  Diagnosis Date  . Hyperlipidemia   . Hypertension   . Vitamin d deficiency   . Adenomatous colon polyp     hyperplastic  . Diverticulosis   . Diabetes mellitus, type 2     ORAL AND INSULIN MEDS (LAST A1C  7.3)  . Atrial tachycardia CARDIOLOGIST - DR Graciela Husbands (LAST VISIT AUG 2012)    Echo 12/11: EF 55-60%, Mild LVH, grade 1 diast dysfxn;    Myoview 12/11: EF 63%, no scar or ischemia (cardiac cath in 3/06 with NO CAD);     holter 1/12: ATach  . Neuropathy, peripheral   . Erythema CURRENT-- CLOSED ABD. WALL ABSCESS    PER PCP NOTE (03-31-11)- MRSA-- TAKES DOXYCYCLINE  . Sepsis, urinary HISTORY - 2004  . Kidney stones LEFT    HX    STONE EXTRACTIONS  . Restless leg syndrome   . Insomnia     TAKES MEDS PRN  . Arthritis     NECK, KNEES,  FINGERS, TOES  . GERD (gastroesophageal reflux disease)     CONTROLLED W/ PREVACID    Past Surgical History  Procedure Date  . Laparoscopic cholecystectomy 2001  . Carpal tunnel release 2000    RIGHT  . Cervical fusion 10-12-07    C5 - 7  . Right thumb joint surg.  MAY 2011  . Gastric bypass 1981  . Abdominal hysterectomy 1985    ovaries remain  . Knee arthroscopy AUG 2012    LEFT  . Cystoscopy/retrograde/ureteroscopy/stone extraction with basket X2 2004 & 2009    LEFT   . Ureteral stent placement X2  2004  &  2009    LEFT  . Percutaneous nephrostolithotomy 02-27-11    LEFT  . Trigger finger release 2010    RIGHT THUMB  . Ureteroscopy 04/06/2011    Procedure: URETEROSCOPY;  Surgeon: Garnett Farm, MD;  Location: [facility];  Service: Urology;  Laterality: Left;  L Ureteroscopy Laser Litho & Stent C-arm, Camera, Digital Ureteroscope, Holmium Laser needed    Family History  Problem Relation Age of Onset  . Diabetes    . Breast cancer    . Heart disease    . Heart attack Father     History  Substance Use Topics  . Smoking status: Current Everyday Smoker -- 0.2 packs/day  for 30 years    Types: Cigarettes  . Smokeless tobacco: Never Used   Comment: 1 pack per week  . Alcohol Use: No    OB History    Grav Para Term Preterm Abortions TAB SAB Ect Mult Living                  Review of Systems  Constitutional: Negative for fever.  Respiratory: Negative for cough.   Cardiovascular: Positive for chest pain. Negative for palpitations.  Gastrointestinal: Negative for nausea.  All other systems reviewed and are negative.    Allergies  Review of patient's allergies indicates no known allergies.  Home Medications   Current Outpatient Rx  Name Route Sig Dispense Refill  . ASPIRIN 81 MG PO TBEC Oral Take 81 mg by mouth daily.     . ATORVASTATIN CALCIUM 20 MG PO TABS Oral Take 20 mg by mouth at bedtime.    . CHOLESTYRAMINE 4 G PO PACK Oral Take 1  packet by mouth 2 (two) times daily with a meal.     . CHOLINE FENOFIBRATE 135 MG PO CPDR Oral Take 135 mg by mouth daily.    Marland Kitchen CLONAZEPAM 1 MG PO TABS Oral Take 1 mg by mouth at bedtime. 1 tab nightly for restless leg.     Marland Kitchen HYDROCODONE-ACETAMINOPHEN 5-500 MG PO TABS Oral Take 1 tablet by mouth daily as needed. For pain.    Marland Kitchen HYDROCORTISONE VALERATE 0.2 % EX CREA Topical Apply 1 application topically 2 (two) times daily. sparingly    . INSULIN ASPART 100 UNIT/ML [place] SOLN Subcutaneous Inject 20 Units into the skin 3 (three) times daily before meals. Per sliding scale    . INSULIN GLARGINE 100 UNIT/ML [place] SOLN Subcutaneous Inject 60-62 Units into the skin at bedtime. Alternates with 62 units every other day    . LANSOPRAZOLE 30 MG PO CPDR Oral Take 30 mg by mouth every morning.      Marland Kitchen METFORMIN HCL 1000 MG PO TABS Oral Take 1,000 mg by mouth 2 (two) times daily with a meal.    . METOPROLOL SUCCINATE ER 50 MG PO TB24 Oral Take 100 mg by mouth 2 (two) times daily.    Marland Kitchen NITROFURANTOIN MONOHYD MACRO 100 MG PO CAPS Oral Take 100 mg by mouth at bedtime as needed. For UTI    . VALSARTAN-HYDROCHLOROTHIAZIDE 160-12.5 MG PO TABS Oral Take 1 tablet by mouth daily.    Marland Kitchen ZOLPIDEM TARTRATE ER 6.25 MG PO TBCR Oral Take 1 tablet (6.25 mg total) by mouth at bedtime as needed for sleep. 30 tablet 3    BP 150/73  Pulse 86  Temp(Src) 97.5 F (36.4 C) (Oral)  Resp 18  Ht 5\' 2"  (1.575 m)  Wt 220 lb (99.791 kg)  BMI 40.24 kg/m2  SpO2 96%  LMP 06/06/1993  Physical Exam  Nursing note and vitals reviewed. Constitutional: She is oriented to person, place, and time. She appears well-developed and well-nourished. No distress.  HENT:  Head: Normocephalic and atraumatic.  Mouth/Throat: Oropharynx is clear and moist.  Neck: Normal range of motion. Neck supple.  Cardiovascular: Normal rate and regular rhythm.  Exam reveals no friction rub.   No murmur heard. Pulmonary/Chest: Effort normal and breath sounds normal. No  respiratory distress.  Abdominal: Soft. Bowel sounds are normal. She exhibits no distension. There is no tenderness.  Musculoskeletal: Normal range of motion. She exhibits no edema.  Neurological: She is alert and oriented to person, place, and time.  Skin:  Skin is warm and dry. She is not diaphoretic.    ED Course  Procedures (including critical care time)   Labs Reviewed  CBC  DIFFERENTIAL  TROPONIN I  COMPREHENSIVE METABOLIC PANEL   No results found.   No diagnosis found.   Date: 08/13/2011  Rate: 76  Rhythm: normal sinus rhythm  QRS Axis: normal  Intervals: normal  ST/T Wave abnormalities: normal  Conduction Disutrbances:none  Narrative Interpretation:   Old EKG Reviewed: unchanged    MDM  The patient presents with symptoms atypical for cardiac pain.  She did not receive much relief with ntg.  She had a normal heart cath several years back and has follow up with Dr. Graciela Husbands in the near future.  She wants to go home and I am comfortable with this.  She agrees to return should her symptoms worsen or change.        Geoffery Lyons, MD 08/13/11 1349

## 2011-08-13 NOTE — Telephone Encounter (Signed)
[facility] Triage Call Report Triage Record Num: [id number] Operator: Durward Mallard DiMatteis Patient Name: Lacey [name] Call Date & Time: 08/13/2011 10:23:15AM Patient Phone: [phone] PCP: Lezlie Octave Patient Gender: Female PCP Fax : [phone] Patient DOB: [birth year] Practice Name: [facility] Reason for Call: Caller: [name]/Patient; PCP: Sheliah Hatch.; CB#: [phone]; Call regarding Chest Pain/Chest Discomfort; sx started 08/10/11; pain is located to the left chest and rad to shoulder and back; pain was off and on and now it is constant for the last 5 1/2 hours; Triaged per Chest Pain Guideline; Call 911 Immed d/t chest pain > 5 or more minutes; pt became upset and sister was placed on the phone; she stated that she will try to get her to call 911 and if not she will take her to [facility] now THE PATIENT REFUSED 911; Protocol(s) Used: Chest Pain Recommended Outcome per Protocol: Activate EMS 911 Reason for Outcome: Pressure, fullness, squeezing sensation or pain anywhere in the chest lasting 5 or more minutes now or within the last hour. Pain is NOT associated with taking a deep breath or a productive cough, movement, or touch to a localized area on the chest. Care Advice: ~ IMMEDIATE ACTION After calling EMS 911, have the person chew one aspirin tablet (325 mg), or 4 baby aspirin (81mg ) with a small amount of water now if conscious, not allergic to aspirin, or if has not been told to avoid taking aspirin by their provider. It is important to use aspirin, not acetaminophen. ~ Take nitroglycerin as directed if prescribed by your provider. Men should not take nitroglycerin within 36 hours of taking erectile dysfunction drugs unless directed to do so by their provider as it may cause a sudden drop in blood pressure. ~ 08/13/2011 10:30:24AM

## 2011-08-13 NOTE — Discharge Instructions (Signed)
Chest Pain (Nonspecific) It is often hard to give a specific diagnosis for the cause of chest pain. There is always a chance that your pain could be related to something serious, such as a heart attack or a blood clot in the lungs. You need to follow up with your caregiver for further evaluation. CAUSES   Heartburn.   Pneumonia or bronchitis.   Anxiety or stress.   Inflammation around your heart (pericarditis) or lung (pleuritis or pleurisy).   A blood clot in the lung.   A collapsed lung (pneumothorax). It can develop suddenly on its own (spontaneous pneumothorax) or from injury (trauma) to the chest.   Shingles infection (herpes zoster virus).  The chest wall is composed of bones, muscles, and cartilage. Any of these can be the source of the pain.  The bones can be bruised by injury.   The muscles or cartilage can be strained by coughing or overwork.   The cartilage can be affected by inflammation and become sore (costochondritis).  DIAGNOSIS  Lab tests or other studies, such as X-rays, electrocardiography, stress testing, or cardiac imaging, may be needed to find the cause of your pain.  TREATMENT   Treatment depends on what may be causing your chest pain. Treatment may include:   Acid blockers for heartburn.   Anti-inflammatory medicine.   Pain medicine for inflammatory conditions.   Antibiotics if an infection is present.   You may be advised to change lifestyle habits. This includes stopping smoking and avoiding alcohol, caffeine, and chocolate.   You may be advised to keep your head raised (elevated) when sleeping. This reduces the chance of acid going backward from your stomach into your esophagus.   Most of the time, nonspecific chest pain will improve within 2 to 3 days with rest and mild pain medicine.  HOME CARE INSTRUCTIONS   If antibiotics were prescribed, take your antibiotics as directed. Finish them even if you start to feel better.   For the next few  days, avoid physical activities that bring on chest pain. Continue physical activities as directed.   Do not smoke.   Avoid drinking alcohol.   Only take over-the-counter or prescription medicine for pain, discomfort, or fever as directed by your caregiver.   Follow your caregiver's suggestions for further testing if your chest pain does not go away.   Keep any follow-up appointments you made. If you do not go to an appointment, you could develop lasting (chronic) problems with pain. If there is any problem keeping an appointment, you must call to reschedule.  SEEK MEDICAL CARE IF:   You think you are having problems from the medicine you are taking. Read your medicine instructions carefully.   Your chest pain does not go away, even after treatment.   You develop a rash with blisters on your chest.  SEEK IMMEDIATE MEDICAL CARE IF:   You have increased chest pain or pain that spreads to your arm, neck, jaw, back, or abdomen.   You develop shortness of breath, an increasing cough, or you are coughing up blood.   You have severe back or abdominal pain, feel nauseous, or vomit.   You develop severe weakness, fainting, or chills.   You have a fever.  THIS IS AN EMERGENCY. Do not wait to see if the pain will go away. Get medical help at once. Call your local emergency services (911 in [place] Do not drive yourself to the hospital. MAKE SURE YOU:   Understand these instructions.     Will watch your condition.   Will get help right away if you are not doing well or get worse.  Document Released: 01/28/2005 Document Revised: 04/09/2011 Document Reviewed: 11/24/2007 Exit[employer] Patient Information 2012 [employer]. 

## 2011-08-13 NOTE — ED Notes (Signed)
C/o intermittent cp over past week, but today pain became constant. L sided cp. SOB also. A&Ox4, resp e/u

## 2011-08-17 ENCOUNTER — Ambulatory Visit (INDEPENDENT_AMBULATORY_CARE_PROVIDER_SITE_OTHER): Payer: BC Managed Care – PPO | Admitting: Family Medicine

## 2011-08-17 ENCOUNTER — Encounter: Payer: Self-pay | Admitting: Nurse Practitioner

## 2011-08-17 ENCOUNTER — Encounter: Payer: Self-pay | Admitting: Family Medicine

## 2011-08-17 ENCOUNTER — Ambulatory Visit (INDEPENDENT_AMBULATORY_CARE_PROVIDER_SITE_OTHER): Payer: BC Managed Care – PPO | Admitting: Nurse Practitioner

## 2011-08-17 VITALS — BP 146/72 | HR 78 | Ht 62.0 in | Wt 228.4 lb

## 2011-08-17 DIAGNOSIS — I498 Other specified cardiac arrhythmias: Secondary | ICD-10-CM

## 2011-08-17 DIAGNOSIS — I471 Supraventricular tachycardia: Secondary | ICD-10-CM

## 2011-08-17 DIAGNOSIS — E1149 Type 2 diabetes mellitus with other diabetic neurological complication: Secondary | ICD-10-CM

## 2011-08-17 DIAGNOSIS — E1140 Type 2 diabetes mellitus with diabetic neuropathy, unspecified: Secondary | ICD-10-CM

## 2011-08-17 DIAGNOSIS — R0789 Other chest pain: Secondary | ICD-10-CM

## 2011-08-17 DIAGNOSIS — I509 Heart failure, unspecified: Secondary | ICD-10-CM

## 2011-08-17 DIAGNOSIS — R079 Chest pain, unspecified: Secondary | ICD-10-CM

## 2011-08-17 DIAGNOSIS — E785 Hyperlipidemia, unspecified: Secondary | ICD-10-CM

## 2011-08-17 DIAGNOSIS — E1142 Type 2 diabetes mellitus with diabetic polyneuropathy: Secondary | ICD-10-CM

## 2011-08-17 DIAGNOSIS — K219 Gastro-esophageal reflux disease without esophagitis: Secondary | ICD-10-CM

## 2011-08-17 NOTE — Patient Instructions (Signed)
Your physician recommends that you return for lab work in: today (ddimer)  Your physician recommends that you schedule a follow-up appointment in: 4/24 with Christain Sacramento PA-C  Your physician has recommended you make the following change in your medication: Take Naproxen twice daily

## 2011-08-17 NOTE — Progress Notes (Signed)
Patient Name: Lacey [name] Date of Encounter: 08/17/2011  Primary Care Provider:  Neena Rhymes, MD, MD Primary Cardiologist:  Odessa Fleming, MD  Patient Profile  61 year old female with history of atypical chest pain presents with recurrence.  Problem List   Past Medical History  Diagnosis Date  . Hyperlipidemia   . Hypertension   . Vitamin d deficiency   . Adenomatous colon polyp     hyperplastic  . Diverticulosis   . Diabetes mellitus, type 2     ORAL AND INSULIN MEDS (LAST A1C  7.3)  . Atrial tachycardia CARDIOLOGIST - DR Graciela Husbands (LAST VISIT AUG 2012)    Echo 12/11: EF 55-60%, Mild LVH, grade 1 diast dysfxn;   holter 1/12: ATach  . Neuropathy, peripheral   . Erythema CURRENT-- CLOSED ABD. WALL ABSCESS    PER PCP NOTE (03-31-11)- MRSA-- TAKES DOXYCYCLINE  . Sepsis, urinary HISTORY - 2004  . Kidney stones LEFT    HX    STONE EXTRACTIONS (10/12, 12/12)  . Restless leg syndrome   . Insomnia     TAKES MEDS PRN  . Arthritis     NECK, KNEES, FINGERS, TOES  . GERD (gastroesophageal reflux disease)     CONTROLLED W/ PREVACID  . Atypical chest pain     a. 07/2004 Cath: Clean cors;  b. 04/2010 Myoview: EF 63%, no ischemia   Past Surgical History  Procedure Date  . Laparoscopic cholecystectomy 2001  . Carpal tunnel release 2000    RIGHT  . Cervical fusion 10-12-07    C5 - 7  . Right thumb joint surg.  MAY 2011  . Gastric bypass 1981  . Abdominal hysterectomy 1985    ovaries remain  . Knee arthroscopy AUG 2012    LEFT  . Cystoscopy/retrograde/ureteroscopy/stone extraction with basket X2 2004 & 2009    LEFT   . Ureteral stent placement X2  2004  &  2009    LEFT  . Percutaneous nephrostolithotomy 02-27-11    LEFT  . Trigger finger release 2010    RIGHT THUMB  . Ureteroscopy 04/06/2011    Procedure: URETEROSCOPY;  Surgeon: Garnett Farm, MD;  Location: [facility];  Service: Urology;  Laterality: Left;  L Ureteroscopy Laser Litho & Stent C-arm, Camera,  Digital Ureteroscope, Holmium Laser needed    Allergies  No Known Allergies  HPI  61 year old female with the above problem list.  She was in her usual state of health until about a month or more ago, when she began to experience left ankle pain and swelling.  Because of this, she did reduce her activity.  Over the past 3 weeks or so, she's been experiencing constant 2-4/10 focal left-sided chest discomfort occurring at the medial edge of her left breast that is worse with deep breathing and certain position changes like outstretching her left arm.  This did not change anything with exertion nor is it any worse with palpation.  The pain itself has no associated symptoms though over the past 3 weeks she has noted some dyspnea on exertion.  Last week, she was diagnosed with a stress fracture left ankle has been wearing a boot.  Further, last Thursday her chest pain worsened and she presented to the ED where her ECG was nonacute and her cardiac markers were negative x2.  She was sent home from the emergency department and had followup with her primary care provider today.  Her chest pain remains much as it was prior to her ER  visit.  The only thing that has changed in the past 3 weeks is that when she has taken Aleve she has had some relief.  Home Medications  Prior to Admission medications   Medication Sig Start Date End Date Taking? Authorizing Provider  aspirin 81 MG EC tablet Take 81 mg by mouth daily.    Yes Historical Provider, MD  atorvastatin (LIPITOR) 20 MG tablet Take 20 mg by mouth at bedtime.   Yes Historical Provider, MD  cholestyramine Lanetta Inch) 4 G packet Take 1 packet by mouth 2 (two) times daily with a meal.  08/25/10 08/25/11 Yes Sheliah Hatch, MD  Choline Fenofibrate (TRILIPIX) 135 MG capsule Take 135 mg by mouth daily.   Yes Historical Provider, MD  clonazePAM (KLONOPIN) 1 MG tablet Take 1 mg by mouth at bedtime. 1 tab nightly for restless leg.  03/30/11  Yes Sheliah Hatch, MD  HYDROcodone-acetaminophen (VICODIN) 5-500 MG per tablet Take 1 tablet by mouth daily as needed. For pain.   Yes Historical Provider, MD  hydrocortisone (WESTCORT) 0.2 % cream Apply 1 application topically 2 (two) times daily. sparingly   Yes Historical Provider, MD  insulin aspart (NOVOLOG FLEXPEN) 100 UNIT/ML injection Inject 20 Units into the skin 3 (three) times daily before meals. Per sliding scale 12/23/10  Yes Sheliah Hatch, MD  insulin glargine (LANTUS SOLOSTAR) 100 UNIT/ML injection Inject 60-62 Units into the skin at bedtime. Alternates with 62 units every other day   Yes Historical Provider, MD  lansoprazole (PREVACID) 30 MG capsule Take 30 mg by mouth every morning.     Yes Historical Provider, MD  metFORMIN (GLUCOPHAGE) 1000 MG tablet Take 1,000 mg by mouth 2 (two) times daily with a meal.   Yes Historical Provider, MD  metoprolol succinate (TOPROL-XL) 50 MG 24 hr tablet Take 100 mg by mouth 2 (two) times daily. 04/03/11  Yes Duke Salvia, MD  nitrofurantoin, macrocrystal-monohydrate, (MACROBID) 100 MG capsule Take 100 mg by mouth at bedtime as needed. For UTI   Yes Historical Provider, MD  valsartan-hydrochlorothiazide (DIOVAN-HCT) 160-12.5 MG per tablet Take 1 tablet by mouth daily.   Yes Historical Provider, MD  zolpidem (AMBIEN CR) 6.25 MG CR tablet Take 6.25 mg by mouth at bedtime as needed. 10/21/10  Yes Sheliah Hatch, MD    Family History  Family History  Problem Relation Age of Onset  . Diabetes    . Breast cancer    . Heart disease    . Heart attack Father    Social History  History   Social History  . Marital Status: Married    Spouse Name: N/A    Number of Children: 2  . Years of Education: N/A   Occupational History  . [employer]     in office   Social History Main Topics  . Smoking status: Current Everyday Smoker -- 0.2 packs/day for 30 years    Types: Cigarettes  . Smokeless tobacco: Never Used   Comment: 1 pack per week   . Alcohol Use: No  . Drug Use: No  . Sexually Active:    Other Topics Concern  . Not on file   Social History Narrative   2 children, 2 stepchildren     Review of Systems Chest pain as outlined in the HPI.  She has also had left ankle pain r/t stress fx and has been wearing a brace for the past week.  No n, v, dizziness, syncope, edema, early satiety, dysuria,  dark stools, blood in stools, diarrhea, rash/skin changes, fevers, chills, wt loss/gain.  Otherwise all systems reviewed and negative.  Physical Exam  Blood pressure 146/72, pulse 78, height 5\' 2"  (1.575 m), weight 228 lb 6.4 oz (103.602 kg), last menstrual period 06/06/1993.  General: Pleasant, NAD Psych: Normal affect. Neuro: Alert and oriented X 3. Moves all extremities spontaneously. HEENT: Normal  Neck: Supple without bruits or JVD. Lungs:  Resp regular and unlabored, CTA. Heart: RRR no s3, s4, or murmurs. Abdomen: Soft, non-tender, non-distended, BS + x 4.  Extremities: No clubbing, cyanosis or edema. DP/PT/Radials 1+ and equal bilaterally.  Accessory Clinical Findings  ECG - sinus rhythm, 89, no acute ST or T changes.  Assessment & Plan  1.  Atypical chest pain:  Patient has been having chest pain of a constant nature of at least 3 weeks duration.  This is worse with deep breathing and also movement of the left arm.  She had cardiac markers drawn last week which were normal and her ECG also shows no objective evidence of ischemia.  This is most likely Musculoskeletal in nature.  That said, she has also had dyspnea on exertion in the setting of some immobility related to a stress fracture of the left ankle.  A d-dimer was not drawn in the ED and we'll check this today with the plan to obtain a CT of the chest if the d-dimer is abnormal.  Recommended she take Aleve p.r.n.(she's on a PPI) and I will see her back in the office in about 10 days to reevaluate.  I have offered her a lexiscan myoview to further r/o ischemia,  but she would like to defer this @ this time.  2.  DOE:  See above.  D-dimer today.  3.  HTN:  Elevated in office today but trending better @ home.  Cont current meds.  4.  DM:  Per IM.  5.  HL:  Cont statin.  LDL 70 on last check (04/2010).  NL LFT's 08/13/2011.  6.  Gerd:  Cont PPI.      Nicolasa Ducking, NP 08/17/2011, 12:49 PM

## 2011-08-17 NOTE — Progress Notes (Signed)
  Subjective:    Patient ID: Lacey [name], female    DOB: 03-26-1951, 61 y.o.   MRN: [id number]  HPI CP- pt went to ER on Thursday.  Was d/c'd w/ dx of atypical CP.  Reports pain has been constant, L sided, radiating to shoulder blade, and associated w/ SOB.  Pain worsens w/ reaching arm.  Reports pain today is not as bad as previously.  Had cardiac enzymes drawn that were normal, reports EDP discussed admission but 'gave me the option to go home'.  Took nitro w/out relief.  Reports L arm will feel 'heavy' and 'sorta numb'.  Has hx of similar.  Had normal cath years ago.  Was given Cards appt for 4/23.   Review of Systems For ROS see HPI     Objective:   Physical Exam  Vitals reviewed. Constitutional: She is oriented to person, place, and time. She appears well-developed and well-nourished.       Obviously uncomfortable.  HENT:  Head: Normocephalic and atraumatic.  Eyes: Conjunctivae and EOM are normal. Pupils are equal, round, and reactive to light.  Neck: Normal range of motion. Neck supple. No thyromegaly present.  Cardiovascular: Normal rate, regular rhythm, normal heart sounds and intact distal pulses.   No murmur heard. Pulmonary/Chest: Effort normal and breath sounds normal. No respiratory distress.  Abdominal: Soft. She exhibits no distension. There is no tenderness.  Musculoskeletal: She exhibits no edema.  Lymphadenopathy:    She has no cervical adenopathy.  Neurological: She is alert and oriented to person, place, and time.  Skin: Skin is warm and dry.  Psychiatric: She has a normal mood and affect. Her behavior is normal.          Assessment & Plan:

## 2011-08-25 ENCOUNTER — Ambulatory Visit: Payer: BC Managed Care – PPO | Admitting: Physician Assistant

## 2011-08-26 ENCOUNTER — Encounter: Payer: Self-pay | Admitting: Nurse Practitioner

## 2011-08-26 ENCOUNTER — Ambulatory Visit (INDEPENDENT_AMBULATORY_CARE_PROVIDER_SITE_OTHER): Payer: BC Managed Care – PPO | Admitting: Nurse Practitioner

## 2011-08-26 VITALS — BP 138/68 | HR 88 | Ht 62.0 in | Wt 229.1 lb

## 2011-08-26 DIAGNOSIS — I5032 Chronic diastolic (congestive) heart failure: Secondary | ICD-10-CM

## 2011-08-26 DIAGNOSIS — I509 Heart failure, unspecified: Secondary | ICD-10-CM

## 2011-08-26 DIAGNOSIS — R0789 Other chest pain: Secondary | ICD-10-CM

## 2011-08-26 DIAGNOSIS — I110 Hypertensive heart disease with heart failure: Secondary | ICD-10-CM

## 2011-08-26 NOTE — Patient Instructions (Signed)
Your physician recommends that you schedule a follow-up appointment as needed with Dr. Klein  

## 2011-08-26 NOTE — Progress Notes (Signed)
Patient Name: Lacey [name] Date of Encounter: 08/26/2011  Primary Care Provider:  Neena Rhymes, MD, MD Primary Cardiologist:  Odessa Fleming, MD  Patient Profile  61 y/o female with recent complaints of positional and pleuritic chest pain who presents for f/u.  Problem List   Past Medical History  Diagnosis Date  . Hyperlipidemia   . Hypertension   . Vitamin d deficiency   . Adenomatous colon polyp     hyperplastic  . Diverticulosis   . Diabetes mellitus, type 2     ORAL AND INSULIN MEDS (LAST A1C  7.3)  . Atrial tachycardia CARDIOLOGIST - DR Graciela Husbands (LAST VISIT AUG 2012)    Echo 12/11: EF 55-60%, Mild LVH, grade 1 diast dysfxn;   holter 1/12: ATach  . Neuropathy, peripheral   . Erythema CURRENT-- CLOSED ABD. WALL ABSCESS    PER PCP NOTE (03-31-11)- MRSA-- TAKES DOXYCYCLINE  . Sepsis, urinary HISTORY - 2004  . Kidney stones LEFT    HX    STONE EXTRACTIONS (10/12, 12/12)  . Restless leg syndrome   . Insomnia     TAKES MEDS PRN  . Arthritis     NECK, KNEES, FINGERS, TOES  . GERD (gastroesophageal reflux disease)     CONTROLLED W/ PREVACID  . Atypical chest pain     a. 07/2004 Cath: Clean cors;  b. 04/2010 Myoview: EF 63%, no ischemia   Past Surgical History  Procedure Date  . Laparoscopic cholecystectomy 2001  . Carpal tunnel release 2000    RIGHT  . Cervical fusion 10-12-07    C5 - 7  . Right thumb joint surg.  MAY 2011  . Gastric bypass 1981  . Abdominal hysterectomy 1985    ovaries remain  . Knee arthroscopy AUG 2012    LEFT  . Cystoscopy/retrograde/ureteroscopy/stone extraction with basket X2 2004 & 2009    LEFT   . Ureteral stent placement X2  2004  &  2009    LEFT  . Percutaneous nephrostolithotomy 02-27-11    LEFT  . Trigger finger release 2010    RIGHT THUMB  . Ureteroscopy 04/06/2011    Procedure: URETEROSCOPY;  Surgeon: Garnett Farm, MD;  Location: [facility];  Service: Urology;  Laterality: Left;  L Ureteroscopy Laser Litho &  Stent   . Cardiac catheterization 07/10/04    Allergies  No Known Allergies  HPI  61 y/o female with the above problem list.  She was last seen here about a week and a 1/2 ago at which time she complained of a several week history of constant, positional, and pleuritic chest pain and tenderness.  She had had nl cardiac markers in the ER the week prior and had no obj evidence of ischemia on ECG.  The pain was easily reproducible with extension of her left arm as well as palpation along the llsb.  We checked a d dimer that day and it was normal.  Since that visit, she has been using aleve prn with some relief.  The pain is no longer constant and she is not as tender as she was.  She has noted that the discomfort is most likely to occur @ night while lying on her left side and is relieved by lying on her back.  Today she is completely pain free.  Home Medications  Prior to Admission medications   Medication Sig Start Date End Date Taking? Authorizing Provider  aspirin 81 MG EC tablet Take 81 mg by mouth daily.  Yes Historical Provider, MD  atorvastatin (LIPITOR) 20 MG tablet Take 20 mg by mouth at bedtime.   Yes Historical Provider, MD  Choline Fenofibrate (TRILIPIX) 135 MG capsule Take 135 mg by mouth daily.   Yes Historical Provider, MD  clonazePAM (KLONOPIN) 1 MG tablet Take 1 mg by mouth at bedtime. 1 tab nightly for restless leg.  03/30/11  Yes Sheliah Hatch, MD  HYDROcodone-acetaminophen (VICODIN) 5-500 MG per tablet Take 1 tablet by mouth daily as needed. For pain.   Yes Historical Provider, MD  hydrocortisone (WESTCORT) 0.2 % cream Apply 1 application topically 2 (two) times daily. sparingly   Yes Historical Provider, MD  insulin aspart (NOVOLOG FLEXPEN) 100 UNIT/ML injection Inject 20 Units into the skin 3 (three) times daily before meals. Per sliding scale 12/23/10  Yes Sheliah Hatch, MD  insulin glargine (LANTUS SOLOSTAR) 100 UNIT/ML injection Inject 60-62 Units into the  skin at bedtime. Alternates with 62 units every other day   Yes Historical Provider, MD  lansoprazole (PREVACID) 30 MG capsule Take 30 mg by mouth every morning.     Yes Historical Provider, MD  metFORMIN (GLUCOPHAGE) 1000 MG tablet Take 1,000 mg by mouth 2 (two) times daily with a meal.   Yes Historical Provider, MD  metoprolol succinate (TOPROL-XL) 50 MG 24 hr tablet Take 100 mg by mouth 2 (two) times daily. 04/03/11  Yes Duke Salvia, MD  nitrofurantoin, macrocrystal-monohydrate, (MACROBID) 100 MG capsule Take 100 mg by mouth at bedtime as needed. For UTI   Yes Historical Provider, MD  valsartan-hydrochlorothiazide (DIOVAN-HCT) 160-12.5 MG per tablet Take 1 tablet by mouth daily.   Yes Historical Provider, MD  cholestyramine Lanetta Inch) 4 G packet Take 1 packet by mouth 2 (two) times daily with a meal.  08/25/10 08/25/11  Sheliah Hatch, MD    Family History  Family History  Problem Relation Age of Onset  . Diabetes    . Breast cancer    . Heart disease    . Heart attack Father     Social History  History   Social History  . Marital Status: Married    Spouse Name: N/A    Number of Children: 2  . Years of Education: N/A   Occupational History  . [employer]     in office   Social History Main Topics  . Smoking status: Current Some Day Smoker -- 0.2 packs/day for 30 years    Types: Cigarettes  . Smokeless tobacco: Never Used   Comment: 1 pack per week  . Alcohol Use: No  . Drug Use: No  . Sexually Active: Not on file   Other Topics Concern  . Not on file   Social History Narrative   2 children, 2 stepchildren     Review of Systems Chest discomfort Ss as above.  No chest pain, sob, n, v, dizziness, syncope, edema, early satiety, dysuria, dark stools, blood in stools, diarrhea, rash/skin changes, fevers, chills, wt loss/gain.  Otherwise all systems reviewed and negative.  Physical Exam  Blood pressure 138/68, pulse 88, height 5\' 2"  (1.575 m), weight 229  lb 1.9 oz (103.928 kg), last menstrual period 06/06/1993.  General: Pleasant, NAD Psych: Normal affect. Neuro: Alert and oriented X 3. Moves all extremities spontaneously. HEENT: Normal  Neck: Supple without bruits or JVD. Lungs:  Resp regular and unlabored, CTA. Heart: RRR no s3, s4, or murmurs. Abdomen: Soft, non-tender, non-distended, BS + x 4.  No chest wall tenderness today. Extremities:  No clubbing, cyanosis or edema. DP/PT/Radials 1+ and equal bilaterally.  Assessment & Plan  1.  Atypical, midsternal chest pain:  This has improved since her last visit and she is pain free today.  She's already had negative cardiac markers and her D Dimer on the last visit was normal.  Ss have shown improvement with prn aleve, though there remains a positional component to the discomfort that she notes when lying on her left side.  Suspect this is MSK in nature.  Will not pursue additional ischemic eval.  She will contact us if Ss recur/worsen at which point we could consider a myoview.  2.  DOE:  Resolved.  D-dimer normal 4/15.  3.  Dispo:  F/U Dr. Graciela Husbands prn.   Nicolasa Ducking, NP 08/26/2011, 3:56 PM

## 2011-09-01 NOTE — Assessment & Plan Note (Signed)
New.  EKG performed w/out acute abnormality.  Given degree of discomfort and ongoing sxs, called cards while pt was in office and they agreed to see her immediately.

## 2011-09-22 ENCOUNTER — Ambulatory Visit (INDEPENDENT_AMBULATORY_CARE_PROVIDER_SITE_OTHER): Payer: BC Managed Care – PPO | Admitting: Family Medicine

## 2011-09-22 ENCOUNTER — Encounter: Payer: Self-pay | Admitting: Family Medicine

## 2011-09-22 VITALS — BP 124/80 | HR 84 | Temp 98.4°F | Ht 64.0 in | Wt 226.4 lb

## 2011-09-22 DIAGNOSIS — E1142 Type 2 diabetes mellitus with diabetic polyneuropathy: Secondary | ICD-10-CM

## 2011-09-22 DIAGNOSIS — E1165 Type 2 diabetes mellitus with hyperglycemia: Secondary | ICD-10-CM

## 2011-09-22 DIAGNOSIS — E1149 Type 2 diabetes mellitus with other diabetic neurological complication: Secondary | ICD-10-CM

## 2011-09-22 LAB — BASIC METABOLIC PANEL
Chloride: 109 mEq/L (ref 96–112)
GFR: 83.4 mL/min (ref >60.00)
Glucose, Bld: 55 mg/dL — ABNORMAL LOW (ref 70–99)
Potassium: 4.3 mEq/L (ref 3.5–5.1)
Sodium: 143 mEq/L (ref 135–145)

## 2011-09-22 NOTE — Progress Notes (Signed)
  Subjective:    Patient ID: Lacey [name], female    DOB: 02/23/1951, 61 y.o.   MRN: [id number]  HPI DM- chronic problem, on Lantus and Novolog, metformin.  CBGs labile.  Denies symptomatic lows.  Taking 62 units of Lantus.  21 units of Novolog w/ each meal.  Has previously seen Dr Lucianne Muss- was unahppy.  UTD on eye exam.  Hx of neuropathy.  Having continued CP.  No SOB, HAs, visual changes, edema.   Review of Systems For ROS see HPI     Objective:   Physical Exam  Vitals reviewed. Constitutional: She is oriented to person, place, and time. She appears well-developed and well-nourished. No distress.  HENT:  Head: Normocephalic and atraumatic.  Eyes: Conjunctivae and EOM are normal. Pupils are equal, round, and reactive to light.  Neck: Normal range of motion. Neck supple. No thyromegaly present.  Cardiovascular: Normal rate, regular rhythm, normal heart sounds and intact distal pulses.   No murmur heard. Pulmonary/Chest: Effort normal and breath sounds normal. No respiratory distress.  Abdominal: Soft. She exhibits no distension. There is no tenderness.  Musculoskeletal: She exhibits no edema.  Lymphadenopathy:    She has no cervical adenopathy.  Neurological: She is alert and oriented to person, place, and time.  Skin: Skin is warm and dry.  Psychiatric: She has a normal mood and affect. Her behavior is normal.          Assessment & Plan:

## 2011-09-22 NOTE — Patient Instructions (Signed)
Follow up in 3 months to recheck cholesterol and diabetes We'll notify you of your lab results and make any med changes if needed Try and relax!  Summer is coming! Hang in there!!!

## 2011-09-22 NOTE — Assessment & Plan Note (Signed)
Chronic problem.  CBGs remain labile.  Has seen endo in the past and was unhappy.  Check labs.  Adjust meds prn.  Encouraged healthy diet and regular exercise.  UTD on eye exam.  Foot exam performed.  Will continue to follow closely.

## 2011-09-27 ENCOUNTER — Encounter: Payer: Self-pay | Admitting: Family Medicine

## 2011-09-30 ENCOUNTER — Telehealth: Payer: Self-pay | Admitting: *Deleted

## 2011-09-30 MED ORDER — INSULIN ASPART 100 UNIT/ML ~~LOC~~ SOLN: 24.0000 [IU] | Freq: Three times a day (TID) | SUBCUTANEOUS | Status: DC

## 2011-09-30 MED ORDER — INSULIN PEN NEEDLE 31G X 5 MM MISC: 1.0000 [IU] | Freq: Four times a day (QID) | Status: DC

## 2011-09-30 NOTE — Telephone Encounter (Signed)
Pt sent my chart message requesting refill for novolog per recent increase, also needs more needles for flex pen noted in chart, sent all to [facility], pt notified via my chart

## 2011-09-30 NOTE — Telephone Encounter (Signed)
All rx sent to [facility] via escribe, pt notified via my chart message

## 2011-10-03 ENCOUNTER — Other Ambulatory Visit: Payer: Self-pay | Admitting: Internal Medicine

## 2011-10-23 ENCOUNTER — Other Ambulatory Visit: Payer: Self-pay | Admitting: Family Medicine

## 2011-10-23 MED ORDER — GLUCOSE BLOOD VI STRP: ORAL_STRIP | Status: DC

## 2011-10-23 NOTE — Telephone Encounter (Signed)
refill one touch ultra strip blue 100, qty 1, refills 2-test four times a day  Last ov 5.21.13

## 2011-10-23 NOTE — Telephone Encounter (Signed)
rx sent to pharmacy by e-script  

## 2011-11-10 ENCOUNTER — Telehealth: Payer: Self-pay | Admitting: Family Medicine

## 2011-11-10 NOTE — Telephone Encounter (Signed)
Refill: Atorvastatin 20mg  tabs. Take 1 tablet at bedtime. Qty 90.

## 2011-11-11 MED ORDER — ATORVASTATIN CALCIUM 20 MG PO TABS: 20.0000 mg | ORAL_TABLET | Freq: Every day | ORAL | Status: DC

## 2011-11-11 NOTE — Telephone Encounter (Signed)
rx sent to pharmacy by e-script per pt upcoming apt

## 2011-11-20 ENCOUNTER — Encounter: Payer: Self-pay | Admitting: Family Medicine

## 2011-11-20 ENCOUNTER — Ambulatory Visit (INDEPENDENT_AMBULATORY_CARE_PROVIDER_SITE_OTHER)
Admission: RE | Admit: 2011-11-20 | Discharge: 2011-11-20 | Disposition: A | Discharge status: Home / Self Care | Payer: BC Managed Care – PPO | Source: Ambulatory Visit | Attending: Family Medicine | Admitting: Family Medicine

## 2011-11-20 ENCOUNTER — Ambulatory Visit (INDEPENDENT_AMBULATORY_CARE_PROVIDER_SITE_OTHER): Payer: BC Managed Care – PPO | Admitting: Family Medicine

## 2011-11-20 VITALS — BP 125/78 | HR 81 | Temp 98.4°F | Ht 63.0 in | Wt 223.4 lb

## 2011-11-20 DIAGNOSIS — M79609 Pain in unspecified limb: Secondary | ICD-10-CM

## 2011-11-20 DIAGNOSIS — M79673 Pain in unspecified foot: Secondary | ICD-10-CM | POA: Insufficient documentation

## 2011-11-20 NOTE — Assessment & Plan Note (Signed)
New.  Pt's pain w/out cause but obvious swelling and TTP.  Get xrays.  Start NSAIDs.  Wear boot when walking.  Pt expressed understanding and is in agreement w/ plan.

## 2011-11-20 NOTE — Progress Notes (Signed)
  Subjective:    Patient ID: Lacey [name], female    DOB: 01/13/1951, 61 y.o.   MRN: [id number]  HPI Foot pain- R foot.  Started hurting spontaneously on Monday.  Worsened until yesterday when she made appt.  Constant pain on top of foot.  No known injury.  Had spontaneous fx of L foot that required surgery.  + swelling.  Pain concentrated over metatarsals.   Review of Systems For ROS see HPI     Objective:   Physical Exam  Vitals reviewed. Constitutional: She appears well-developed and well-nourished. No distress.  Musculoskeletal:       Right foot: She exhibits tenderness, bony tenderness and swelling. She exhibits normal range of motion.       Feet:          Assessment & Plan:

## 2011-11-20 NOTE — Patient Instructions (Addendum)
Go to [address] [employer]) for your Surveyor, minerals!!! Ibuprofen as needed for pain We'll call you with your xray results Hang in there!!!

## 2011-11-24 ENCOUNTER — Other Ambulatory Visit: Payer: Self-pay | Admitting: Gynecology

## 2011-11-24 ENCOUNTER — Encounter: Payer: Self-pay | Admitting: Family Medicine

## 2011-11-26 ENCOUNTER — Telehealth (INDEPENDENT_AMBULATORY_CARE_PROVIDER_SITE_OTHER): Payer: Self-pay

## 2011-11-26 NOTE — Telephone Encounter (Signed)
Patient called regarding Barium Swallow Test (Speech Pathology) ordered back in 2012.  Incorrect test was ordered, patient states they have turned this bill over to collections.  Patient will mail a copy of this bill to our office.

## 2011-12-17 ENCOUNTER — Other Ambulatory Visit: Payer: Self-pay | Admitting: Family Medicine

## 2011-12-17 ENCOUNTER — Other Ambulatory Visit: Payer: Self-pay | Admitting: Internal Medicine

## 2011-12-23 ENCOUNTER — Encounter: Payer: Self-pay | Admitting: Family Medicine

## 2011-12-23 ENCOUNTER — Ambulatory Visit (INDEPENDENT_AMBULATORY_CARE_PROVIDER_SITE_OTHER): Payer: BC Managed Care – PPO | Admitting: Family Medicine

## 2011-12-23 VITALS — BP 122/77 | HR 86 | Temp 98.3°F | Ht 62.5 in | Wt 226.6 lb

## 2011-12-23 DIAGNOSIS — E785 Hyperlipidemia, unspecified: Secondary | ICD-10-CM

## 2011-12-23 DIAGNOSIS — E1142 Type 2 diabetes mellitus with diabetic polyneuropathy: Secondary | ICD-10-CM

## 2011-12-23 DIAGNOSIS — E1149 Type 2 diabetes mellitus with other diabetic neurological complication: Secondary | ICD-10-CM

## 2011-12-23 DIAGNOSIS — I110 Hypertensive heart disease with heart failure: Secondary | ICD-10-CM

## 2011-12-23 DIAGNOSIS — G4700 Insomnia, unspecified: Secondary | ICD-10-CM

## 2011-12-23 DIAGNOSIS — I509 Heart failure, unspecified: Secondary | ICD-10-CM

## 2011-12-23 DIAGNOSIS — E1140 Type 2 diabetes mellitus with diabetic neuropathy, unspecified: Secondary | ICD-10-CM

## 2011-12-23 LAB — BASIC METABOLIC PANEL
Chloride: 108 mEq/L (ref 96–112)
GFR: 90.23 mL/min (ref >60.00)
Glucose, Bld: 136 mg/dL — ABNORMAL HIGH (ref 70–99)
Potassium: 4.7 mEq/L (ref 3.5–5.1)
Sodium: 140 mEq/L (ref 135–145)

## 2011-12-23 LAB — HEPATIC FUNCTION PANEL
AST: 61 U/L — ABNORMAL HIGH (ref 0–37)
Albumin: 3.9 g/dL (ref 3.5–5.2)
Alkaline Phosphatase: 62 U/L (ref 39–117)
Total Bilirubin: 0.6 mg/dL (ref 0.3–1.2)

## 2011-12-23 LAB — LIPID PANEL
HDL: 40.1 mg/dL (ref >39.00)
Total CHOL/HDL Ratio: 3
VLDL: 32.6 mg/dL (ref 0.0–40.0)

## 2011-12-23 MED ORDER — TRAZODONE HCL 50 MG PO TABS: 25.0000 mg | ORAL_TABLET | Freq: Every evening | ORAL | Status: DC | PRN

## 2011-12-23 NOTE — Assessment & Plan Note (Signed)
Chronic problem.  Tolerating meds w/out difficulty.  Check labs.  Adjust meds prn  

## 2011-12-23 NOTE — Assessment & Plan Note (Signed)
Chronic problem.  UTD on eye exam.  No symptomatic lows.  Check labs.  Adjust meds prn

## 2011-12-23 NOTE — Assessment & Plan Note (Signed)
Chronic problem.  Well controlled.  Asymptomatic.  No changes. 

## 2011-12-23 NOTE — Progress Notes (Signed)
  Subjective:    Patient ID: Lacey [name], female    DOB: 02/09/1951, 61 y.o.   MRN: [id number]  HPI DM- chronic problem, on Novolog 24 units TID , Lantus 62 units QHS, Metformin.  CBG yesterday was 112.  Denies symptomatic lows.  UTD on eye exam.  No numbness or tingling of hands or feet.  Hyperlipidemia- chronic problem, on Lipitor, Trilipix.  Due for labs today.  Denies abd pain, N/V, myalgias.  HTN- chronic problem, well controlled on Toprol XL, Diovan HCT.  No CP, SOB, HAs, visual changes, edema.  Insomnia- recurrent problem, previously on Ambien w/out relief.  Klonopin helps some but still finds herself lying in bed from 10-1:30 unable to sleep.  Has taken Trazodone previously w/ good results.   Review of Systems For ROS see HPI     Objective:   Physical Exam  Vitals reviewed. Constitutional: She is oriented to person, place, and time. She appears well-developed and well-nourished. No distress.  HENT:  Head: Normocephalic and atraumatic.  Eyes: Conjunctivae and EOM are normal. Pupils are equal, round, and reactive to light.  Neck: Normal range of motion. Neck supple. No thyromegaly present.  Cardiovascular: Normal rate, regular rhythm, normal heart sounds and intact distal pulses.   No murmur heard. Pulmonary/Chest: Effort normal and breath sounds normal. No respiratory distress.  Abdominal: Soft. She exhibits no distension. There is no tenderness.  Musculoskeletal: She exhibits no edema.  Lymphadenopathy:    She has no cervical adenopathy.  Neurological: She is alert and oriented to person, place, and time.  Skin: Skin is warm and dry.  Psychiatric: She has a normal mood and affect. Her behavior is normal.          Assessment & Plan:

## 2011-12-23 NOTE — Patient Instructions (Addendum)
Follow up in 3 months to recheck diabetes We'll notify you of your lab results and make any changes if needed Restart the Trazodone nightly for sleep Call with any questions or concerns Daisey Must w/ school!!!

## 2011-12-23 NOTE — Assessment & Plan Note (Addendum)
Recurrent issue.  Pt not getting relief from klonopin.  Add Trazodone as this has worked well for her in the past.  Will follow.

## 2011-12-26 ENCOUNTER — Other Ambulatory Visit: Payer: Self-pay | Admitting: Family Medicine

## 2012-01-19 ENCOUNTER — Other Ambulatory Visit: Payer: Self-pay | Admitting: Family Medicine

## 2012-01-19 MED ORDER — ATORVASTATIN CALCIUM 20 MG PO TABS: 20.0000 mg | ORAL_TABLET | Freq: Every day | ORAL | Status: DC

## 2012-01-19 NOTE — Telephone Encounter (Signed)
rx sent to pharmacy by e-script  

## 2012-01-19 NOTE — Telephone Encounter (Signed)
refill atorvastatin tabs 20mg  #90 take 1 tablet at bedtime --- last wrt 7.10.13 #90 Last ov 8.21.13 f/u dm

## 2012-02-01 ENCOUNTER — Ambulatory Visit (INDEPENDENT_AMBULATORY_CARE_PROVIDER_SITE_OTHER): Payer: BC Managed Care – PPO | Admitting: Family Medicine

## 2012-02-01 ENCOUNTER — Encounter: Payer: Self-pay | Admitting: Family Medicine

## 2012-02-01 VITALS — BP 125/80 | HR 81 | Temp 98.8°F | Ht 62.25 in | Wt 220.8 lb

## 2012-02-01 DIAGNOSIS — H6690 Otitis media, unspecified, unspecified ear: Secondary | ICD-10-CM | POA: Insufficient documentation

## 2012-02-01 DIAGNOSIS — J209 Acute bronchitis, unspecified: Secondary | ICD-10-CM | POA: Insufficient documentation

## 2012-02-01 MED ORDER — GUAIFENESIN-CODEINE 100-10 MG/5ML PO SYRP: 10.0000 mL | ORAL_SOLUTION | Freq: Three times a day (TID) | ORAL | Status: DC | PRN

## 2012-02-01 MED ORDER — AMOXICILLIN 875 MG PO TABS: 875.0000 mg | ORAL_TABLET | Freq: Two times a day (BID) | ORAL | Status: DC

## 2012-02-01 NOTE — Assessment & Plan Note (Signed)
New.  Bilateral OM.  Start amox.  Reviewed supportive care and red flags that should prompt return.  Pt expressed understanding and is in agreement w/ plan.

## 2012-02-01 NOTE — Progress Notes (Signed)
  Subjective:    Patient ID: Lacey [name], female    DOB: 11-12-1950, 61 y.o.   MRN: [id number]  HPI URI- sxs started 5 days ago.  Having sore throat, bilateral ear pain.  Denies nasal congestion.  Denies facial pain.  + cough, worse at night.  + sick contacts.  + subjective fevers.  Had severe nausea on Wed, none since.  Cough is productive.  + body aches.     Review of Systems For ROS see HPI     Objective:   Physical Exam  Vitals reviewed. Constitutional: She is oriented to person, place, and time. She appears well-developed and well-nourished. No distress.  HENT:  Head: Normocephalic and atraumatic.       TMs dull, erythematous w/ poor landmarks No TTP over sinuses + PND and posterior pharyngeal erythema  Neck: Normal range of motion. Neck supple.  Cardiovascular: Normal rate, regular rhythm and normal heart sounds.   Pulmonary/Chest: No respiratory distress. She has wheezes (diffusely throughout, improved s/p neb tx). She has no rales.  Lymphadenopathy:    She has cervical adenopathy.  Neurological: She is alert and oriented to person, place, and time. No cranial nerve deficit.  Skin: Skin is warm and dry.          Assessment & Plan:

## 2012-02-01 NOTE — Assessment & Plan Note (Signed)
New.  Pt w/ wheezing diffusely.  This improved s/p neb tx.  amox for bronchitis.  Pt to call if wheezing doesn't improve- will then require inhaler.  Reviewed supportive care and red flags that should prompt return.  Pt expressed understanding and is in agreement w/ plan.

## 2012-02-01 NOTE — Patient Instructions (Addendum)
Start the Amoxicillin twice daily for the ear infection and bronchitis- take w/ food Add Mucinex to thin congestion Cough syrup as needed- will cause drowsiness Drink plenty of fluids REST!!! Call with any questions or concerns- particularly if the breathing worsens Hang in there!

## 2012-02-02 MED ORDER — IPRATROPIUM BROMIDE 0.02 % IN SOLN
0.5000 mg | Freq: Once | RESPIRATORY_TRACT | Status: AC
  Administered 2012-02-02: 0.5 mg via RESPIRATORY_TRACT

## 2012-02-02 MED ORDER — ALBUTEROL SULFATE (5 MG/ML) 0.5% IN NEBU
2.5000 mg | INHALATION_SOLUTION | Freq: Once | RESPIRATORY_TRACT | Status: AC
  Administered 2012-02-02: 2.5 mg via RESPIRATORY_TRACT

## 2012-02-02 NOTE — Addendum Note (Signed)
Addended by: Derry Lory A on: 02/02/2012 03:42 PM   Modules accepted: Orders

## 2012-02-09 ENCOUNTER — Telehealth: Payer: Self-pay

## 2012-02-09 NOTE — Telephone Encounter (Signed)
Pt states ears still clogged up hard to hear. Pt was seen 02/01/12. Pt states Meds still not working is there anything else you can Rx or advise. Plz Advise   MW

## 2012-02-10 MED ORDER — CEFDINIR 300 MG PO CAPS: 300.0000 mg | ORAL_CAPSULE | Freq: Two times a day (BID) | ORAL | Status: DC

## 2012-02-10 NOTE — Telephone Encounter (Signed)
Spoke with pt verified pharmacy and made pt aware Rx sent.        MW

## 2012-02-10 NOTE — Telephone Encounter (Signed)
Can switch to Omnicef 300mg  BID x10 days

## 2012-02-22 ENCOUNTER — Other Ambulatory Visit: Payer: Self-pay | Admitting: Family Medicine

## 2012-02-22 MED ORDER — CLONAZEPAM 1 MG PO TABS: 1.0000 mg | ORAL_TABLET | Freq: Every day | ORAL | Status: DC

## 2012-02-22 NOTE — Telephone Encounter (Signed)
.  rx [facility]ed to pharmacy, [facility]

## 2012-02-22 NOTE — Telephone Encounter (Signed)
refills x 3 last ov 9.30.13-acute---requesting 90-day supply on all 3  1-BD PEN NED UF SHORT 31G5/16 #3 WT/1-REFILL Use 1 Four Times Daily  2-METFORMIN HCL TABS 1000MG  #90-wt/3-refills Take 1 tablet twice daily   3-CLONZAEPAM TABS 1MG  #90-wt/3/refills Take 1 tablet nightly for restless leg

## 2012-02-22 NOTE — Telephone Encounter (Signed)
Last OV 02-01-12 last refill for clonazepam 03-30-11 #90 with 3 refills, noted for RLS at HS

## 2012-02-22 NOTE — Telephone Encounter (Signed)
Ok for #90 x3 on all meds

## 2012-02-26 ENCOUNTER — Ambulatory Visit (INDEPENDENT_AMBULATORY_CARE_PROVIDER_SITE_OTHER): Payer: BC Managed Care – PPO | Admitting: Internal Medicine

## 2012-02-26 VITALS — BP 130/64 | HR 70 | Temp 98.1°F | Wt 224.0 lb

## 2012-02-26 DIAGNOSIS — Z23 Encounter for immunization: Secondary | ICD-10-CM

## 2012-02-26 DIAGNOSIS — R252 Cramp and spasm: Secondary | ICD-10-CM

## 2012-02-26 MED ORDER — CYCLOBENZAPRINE HCL 10 MG PO TABS: 10.0000 mg | ORAL_TABLET | Freq: Every evening | ORAL | Status: DC | PRN

## 2012-02-26 NOTE — Patient Instructions (Addendum)
Take flexeril at bedtime, watch for excessive somnolence Call if symptoms are severe or not better in few days

## 2012-02-26 NOTE — Progress Notes (Signed)
  Subjective:    Patient ID: Lacey [name], female    DOB: Apr 08, 1951, 61 y.o.   MRN: [id number]  HPI Acute visit Twice this week she woke up in the middle of the night with an acute pain at the right thigh, described as a cramp, lasted 2 or 3 hours then went away but left the area sore. She has a history of RLS, this pain is different, did not improve by shaking her leg.  Past Medical History  Diagnosis Date  . Hyperlipidemia   . Hypertension   . Vitamin D deficiency   . Adenomatous colon polyp     hyperplastic  . Diverticulosis   . Diabetes mellitus, type 2     ORAL AND INSULIN MEDS (LAST A1C  7.3)  . Atrial tachycardia CARDIOLOGIST - DR Graciela Husbands (LAST VISIT AUG 2012)    Echo 12/11: EF 55-60%, Mild LVH, grade 1 diast dysfxn;   holter 1/12: ATach  . Neuropathy, peripheral   . Erythema CURRENT-- CLOSED ABD. WALL ABSCESS    PER PCP NOTE (03-31-11)- MRSA-- TAKES DOXYCYCLINE  . Sepsis, urinary HISTORY - 2004  . Kidney stones LEFT    HX    STONE EXTRACTIONS (10/12, 12/12)  . Restless leg syndrome   . Insomnia     TAKES MEDS PRN  . Arthritis     NECK, KNEES, FINGERS, TOES  . GERD (gastroesophageal reflux disease)     CONTROLLED W/ PREVACID  . Atypical chest pain     a. 07/2004 Cath: Clean cors;  b. 04/2010 Myoview: EF 63%, no ischemia    Review of Systems She's not taking any new medicines. Denies any unusual lower extremity edema or rash. No back pain or any recent back injury. No claudication per se.     Objective:   Physical Exam General -- alert, well-developed, and overweight appearing. No apparent distress.  Abdomen--soft, non-tender, no distention, no masses .   Extremities--   inspection and palpation of the thighs, knees, pretibial areas normal. No edema ; knees and hips with normal range of motion. Normal femoral and pedal pulses Neurologic-- alert & oriented X3 ; gait normal, DTRs and strength normal in all extremities. Psych-- Cognition and judgment appear intact.  Alert and cooperative with normal attention span and concentration.  not anxious appearing and not depressed appearing.  \     Assessment & Plan:   Leg cramps, Leg cramps twice this week, different from RLS, vascular and neurological exam essentially negative. Etiology unclear but she does not seem to have a radiculopathy or a DVT. also, denies meralgia paresthetica type of symptoms Plan: Flexeril as needed, reassess if symptoms continue

## 2012-02-28 ENCOUNTER — Encounter: Payer: Self-pay | Admitting: Internal Medicine

## 2012-03-10 ENCOUNTER — Encounter: Payer: Self-pay | Admitting: Family Medicine

## 2012-03-15 ENCOUNTER — Telehealth: Payer: Self-pay | Admitting: Family Medicine

## 2012-03-15 MED ORDER — INSULIN PEN NEEDLE 31G X 8 MM MISC: Status: DC

## 2012-03-15 NOTE — Telephone Encounter (Signed)
Refill: BD pen ned uf short 8mm 31G5/16. Use four times a day. Qty 1

## 2012-03-16 ENCOUNTER — Other Ambulatory Visit: Payer: Self-pay | Admitting: Cardiology

## 2012-03-16 MED ORDER — METFORMIN HCL 1000 MG PO TABS: 1000.0000 mg | ORAL_TABLET | Freq: Two times a day (BID) | ORAL | Status: DC

## 2012-03-16 MED ORDER — METOPROLOL SUCCINATE ER 50 MG PO TB24: 100.0000 mg | ORAL_TABLET | Freq: Two times a day (BID) | ORAL | Status: DC

## 2012-03-16 NOTE — Telephone Encounter (Signed)
Rx sent 

## 2012-03-24 ENCOUNTER — Ambulatory Visit (INDEPENDENT_AMBULATORY_CARE_PROVIDER_SITE_OTHER): Payer: BC Managed Care – PPO | Admitting: Family Medicine

## 2012-03-24 ENCOUNTER — Encounter: Payer: Self-pay | Admitting: Family Medicine

## 2012-03-24 VITALS — BP 130/62 | HR 84 | Temp 98.6°F | Ht 63.0 in | Wt 224.0 lb

## 2012-03-24 DIAGNOSIS — E1149 Type 2 diabetes mellitus with other diabetic neurological complication: Secondary | ICD-10-CM

## 2012-03-24 DIAGNOSIS — E1142 Type 2 diabetes mellitus with diabetic polyneuropathy: Secondary | ICD-10-CM

## 2012-03-24 DIAGNOSIS — Z0000 Encounter for general adult medical examination without abnormal findings: Secondary | ICD-10-CM

## 2012-03-24 DIAGNOSIS — Z2911 Encounter for prophylactic immunotherapy for respiratory syncytial virus (RSV): Secondary | ICD-10-CM

## 2012-03-24 DIAGNOSIS — E1140 Type 2 diabetes mellitus with diabetic neuropathy, unspecified: Secondary | ICD-10-CM

## 2012-03-24 DIAGNOSIS — E785 Hyperlipidemia, unspecified: Secondary | ICD-10-CM

## 2012-03-24 LAB — BASIC METABOLIC PANEL
GFR: 98.21 mL/min (ref >60.00)
Potassium: 4.4 mEq/L (ref 3.5–5.1)
Sodium: 139 mEq/L (ref 135–145)

## 2012-03-24 LAB — HEMOGLOBIN A1C: Hgb A1c MFr Bld: 6.5 % (ref 4.6–6.5)

## 2012-03-24 MED ORDER — FENOFIBRATE 160 MG PO TABS: 160.0000 mg | ORAL_TABLET | Freq: Every day | ORAL | Status: DC

## 2012-03-24 NOTE — Progress Notes (Signed)
  Subjective:    Patient ID: Lacey [name], female    DOB: 03/05/1951, 61 y.o.   MRN: [id number]  HPI DM- chronic problem, CBGs 'running double digits'.  Had 1 symptomatic low at work.  No CP, SOB, HAs, visual changes, edema.  Some numbness/tingling of feet but not progressing.  UTD on eye exam.  On Lantus, metformin, novolog.  Shingles shot- wants it today  Review of Systems For ROS see HPI     Objective:   Physical Exam  Vitals reviewed. Constitutional: She is oriented to person, place, and time. She appears well-developed and well-nourished. No distress.  HENT:  Head: Normocephalic and atraumatic.  Eyes: Conjunctivae normal and EOM are normal. Pupils are equal, round, and reactive to light.  Neck: Normal range of motion. Neck supple. No thyromegaly present.  Cardiovascular: Normal rate, regular rhythm, normal heart sounds and intact distal pulses.   No murmur heard. Pulmonary/Chest: Effort normal and breath sounds normal. No respiratory distress.  Abdominal: Soft. She exhibits no distension. There is no tenderness.  Musculoskeletal: She exhibits no edema.  Lymphadenopathy:    She has no cervical adenopathy.  Neurological: She is alert and oriented to person, place, and time.  Skin: Skin is warm and dry.  Psychiatric: She has a normal mood and affect. Her behavior is normal.          Assessment & Plan:

## 2012-03-24 NOTE — Assessment & Plan Note (Signed)
Switch to generic fenofibrate due to formulary change.

## 2012-03-24 NOTE — Assessment & Plan Note (Signed)
CBGs much improved recently.  Pt thrilled w/ change.  UTD on eye exam.  Check labs.  Adjust meds prn

## 2012-03-24 NOTE — Patient Instructions (Addendum)
Follow up in 3 months to recheck diabetes and cholesterol You look great!!  Keep up the good work!! Call with any questions or concerns Happy Holidays!!!  (and early birthday!!!)

## 2012-05-24 ENCOUNTER — Other Ambulatory Visit: Payer: Self-pay | Admitting: Internal Medicine

## 2012-05-24 ENCOUNTER — Other Ambulatory Visit: Payer: Self-pay | Admitting: Family Medicine

## 2012-05-25 ENCOUNTER — Other Ambulatory Visit: Payer: Self-pay | Admitting: *Deleted

## 2012-05-25 ENCOUNTER — Encounter: Payer: Self-pay | Admitting: *Deleted

## 2012-05-25 DIAGNOSIS — E119 Type 2 diabetes mellitus without complications: Secondary | ICD-10-CM

## 2012-05-25 MED ORDER — INSULIN GLARGINE 100 UNIT/ML ~~LOC~~ SOLN: 60.0000 [IU] | Freq: Every day | SUBCUTANEOUS | Status: DC

## 2012-05-25 MED ORDER — INSULIN PEN NEEDLE 31G X 8 MM MISC: Status: DC

## 2012-05-25 MED ORDER — ONETOUCH DELICA LANCETS FINE MISC: 1.0000 | Freq: Four times a day (QID) | Status: DC

## 2012-06-22 ENCOUNTER — Encounter: Payer: Self-pay | Admitting: Lab

## 2012-06-23 ENCOUNTER — Other Ambulatory Visit: Payer: Self-pay | Admitting: Family Medicine

## 2012-06-23 ENCOUNTER — Encounter: Payer: Self-pay | Admitting: Family Medicine

## 2012-06-23 ENCOUNTER — Ambulatory Visit (INDEPENDENT_AMBULATORY_CARE_PROVIDER_SITE_OTHER): Payer: BC Managed Care – PPO | Admitting: Family Medicine

## 2012-06-23 VITALS — BP 130/60 | HR 74 | Temp 98.3°F | Ht 64.0 in | Wt 225.2 lb

## 2012-06-23 DIAGNOSIS — M79609 Pain in unspecified limb: Secondary | ICD-10-CM

## 2012-06-23 DIAGNOSIS — E1142 Type 2 diabetes mellitus with diabetic polyneuropathy: Secondary | ICD-10-CM

## 2012-06-23 DIAGNOSIS — E1149 Type 2 diabetes mellitus with other diabetic neurological complication: Secondary | ICD-10-CM

## 2012-06-23 DIAGNOSIS — E785 Hyperlipidemia, unspecified: Secondary | ICD-10-CM

## 2012-06-23 DIAGNOSIS — I110 Hypertensive heart disease with heart failure: Secondary | ICD-10-CM

## 2012-06-23 DIAGNOSIS — M79673 Pain in unspecified foot: Secondary | ICD-10-CM

## 2012-06-23 DIAGNOSIS — IMO0002 Reserved for concepts with insufficient information to code with codable children: Secondary | ICD-10-CM

## 2012-06-23 DIAGNOSIS — E1165 Type 2 diabetes mellitus with hyperglycemia: Secondary | ICD-10-CM

## 2012-06-23 LAB — BASIC METABOLIC PANEL
CO2: 26 mEq/L (ref 19–32)
Calcium: 9.2 mg/dL (ref 8.4–10.5)
Chloride: 105 mEq/L (ref 96–112)
Glucose, Bld: 94 mg/dL (ref 70–99)
Potassium: 4 mEq/L (ref 3.5–5.1)
Sodium: 140 mEq/L (ref 135–145)

## 2012-06-23 LAB — HEPATIC FUNCTION PANEL
ALT: 27 U/L (ref 0–35)
AST: 33 U/L (ref 0–37)
Albumin: 3.9 g/dL (ref 3.5–5.2)
Alkaline Phosphatase: 64 U/L (ref 39–117)
Total Protein: 7 g/dL (ref 6.0–8.3)

## 2012-06-23 LAB — TSH: TSH: 1.58 u[IU]/mL (ref 0.35–5.50)

## 2012-06-23 LAB — LIPID PANEL: Total CHOL/HDL Ratio: 3

## 2012-06-23 NOTE — Progress Notes (Signed)
  Subjective:    Patient ID: Lacey [name], female    DOB: 10/08/1950, 62 y.o.   MRN: [id number]  HPI DM- chronic problem, last A1C showed good control.  Denies symptomatic lows.  UTD on eye exam, July 2013.  On Lantus, metformin, novolog SSI.  Hyperlipidemia- chronic problem, pt is taking fenofibrate but uncertain if taking Lipitor at this time.  Denies abd pain, N/V, myalgia.  HTN- chronic problem, on Toprol, Diovan HCT.  Denies CP, SOB, HAs, visual changes, + edema.  Bilateral foot pain- pt reports she will get a 'stabbing pain' that 'comes up from the bottom'.  Will occur w/ sitting and not necessarily weight bearing.   Review of Systems For ROS see HPI     Objective:   Physical Exam  Vitals reviewed. Constitutional: She is oriented to person, place, and time. She appears well-developed and well-nourished. No distress.  HENT:  Head: Normocephalic and atraumatic.  Eyes: Conjunctivae and EOM are normal. Pupils are equal, round, and reactive to light.  Neck: Normal range of motion. Neck supple. No thyromegaly present.  Cardiovascular: Normal rate, regular rhythm, normal heart sounds and intact distal pulses.   No murmur heard. Pulmonary/Chest: Effort normal and breath sounds normal. No respiratory distress.  Abdominal: Soft. She exhibits no distension. There is no tenderness.  Musculoskeletal: She exhibits no edema.  Lymphadenopathy:    She has no cervical adenopathy.  Neurological: She is alert and oriented to person, place, and time.  Skin: Skin is warm and dry.  Psychiatric: She has a normal mood and affect. Her behavior is normal.          Assessment & Plan:

## 2012-06-23 NOTE — Patient Instructions (Signed)
Schedule your complete physical for mid July We'll notify you of your lab results and make any changes if needed We'll call you with your podiatry appt Keep up the good work! Happy [birth year] Birthday!!!

## 2012-06-23 NOTE — Assessment & Plan Note (Signed)
Chronic problem.  BP well controlled.  Asymptomatic.  Check labs.  No changes.

## 2012-06-23 NOTE — Assessment & Plan Note (Signed)
Chronic problem.  Tolerating statin w/out difficulty.  Check labs.  Adjust meds prn  

## 2012-06-23 NOTE — Assessment & Plan Note (Signed)
Pt's pain not consistent w/ neuropathy.  Possible metatarsalgia vs neuroma.  Refer to podiatry.  Pt expressed understanding and is in agreement w/ plan.

## 2012-06-23 NOTE — Assessment & Plan Note (Signed)
Chronic problem.  Asymptomatic.  UTD on eye exam.  Foot exam performed today.  Tolerating meds w/out difficulty.  Check labs.  Adjust meds prn.

## 2012-06-24 ENCOUNTER — Other Ambulatory Visit: Payer: Self-pay | Admitting: *Deleted

## 2012-06-24 MED ORDER — CLONAZEPAM 1 MG PO TABS: 1.0000 mg | ORAL_TABLET | Freq: Every day | ORAL | Status: DC

## 2012-06-24 NOTE — Telephone Encounter (Signed)
Rx sent to the pharmacy([facility]) by fax.//AB/CMA

## 2012-07-04 ENCOUNTER — Other Ambulatory Visit: Payer: Self-pay | Admitting: Family Medicine

## 2012-07-05 ENCOUNTER — Other Ambulatory Visit: Payer: Self-pay | Admitting: Family Medicine

## 2012-07-05 DIAGNOSIS — E119 Type 2 diabetes mellitus without complications: Secondary | ICD-10-CM

## 2012-07-11 MED ORDER — INSULIN PEN NEEDLE 31G X 8 MM MISC: Status: DC

## 2012-07-14 ENCOUNTER — Encounter: Payer: Self-pay | Admitting: Family Medicine

## 2012-08-15 ENCOUNTER — Encounter: Payer: Self-pay | Admitting: Family Medicine

## 2012-08-16 ENCOUNTER — Other Ambulatory Visit: Payer: Self-pay | Admitting: *Deleted

## 2012-08-16 DIAGNOSIS — E119 Type 2 diabetes mellitus without complications: Secondary | ICD-10-CM

## 2012-08-16 MED ORDER — INSULIN GLARGINE 100 UNIT/ML ~~LOC~~ SOLN: 60.0000 [IU] | Freq: Every day | SUBCUTANEOUS | Status: DC

## 2012-08-16 NOTE — Telephone Encounter (Signed)
Refill for lantus solostar sent to [facility]

## 2012-08-22 ENCOUNTER — Other Ambulatory Visit: Payer: Self-pay | Admitting: *Deleted

## 2012-08-22 NOTE — Telephone Encounter (Signed)
Spoke with Lacey [name] at [facility] to clarify sig for Lantus. Patient is to receive enough of medication to cover 62 units/ daily. Dispensing a 90 day supply with 3 refills.

## 2012-08-22 NOTE — Telephone Encounter (Signed)
See note below.//AB/CMA 

## 2012-10-05 ENCOUNTER — Ambulatory Visit (INDEPENDENT_AMBULATORY_CARE_PROVIDER_SITE_OTHER): Payer: BC Managed Care – PPO | Admitting: Family Medicine

## 2012-10-05 ENCOUNTER — Encounter: Payer: Self-pay | Admitting: Family Medicine

## 2012-10-05 VITALS — BP 130/62 | HR 82 | Temp 98.2°F | Ht 64.0 in | Wt 230.0 lb

## 2012-10-05 DIAGNOSIS — I951 Orthostatic hypotension: Secondary | ICD-10-CM | POA: Insufficient documentation

## 2012-10-05 DIAGNOSIS — R0982 Postnasal drip: Secondary | ICD-10-CM

## 2012-10-05 NOTE — Assessment & Plan Note (Signed)
New.  Likely the cause of pt's 'squeaking' at night.  Start OTC antihistamine daily.  If no improvement, pt to call.

## 2012-10-05 NOTE — Progress Notes (Signed)
  Subjective:    Patient ID: Lacey [name], female    DOB: 18-Apr-1951, 62 y.o.   MRN: [id number]  HPI Dizziness- sxs started Monday afternoon 'running into things, being dizzy and nauseated'.  At times 'feels like i'm spinning'.  Will have dizziness upon standing, bending over, reaching above head.  No sinus pain/pressure.  No CP.  Mild SOB.  No sinus congestion or pressure.  No vomiting.  Pt reports wheezing at night.  Constant need to clear throat.  Husband describes this as a 'mouse squeaking'.  + nasal congestion.   Review of Systems For ROS see HPI     Objective:   Physical Exam  Vitals reviewed. Constitutional: She is oriented to person, place, and time. She appears well-developed and well-nourished. No distress.  HENT:  Head: Normocephalic and atraumatic.  Mouth/Throat: Uvula is midline and mucous membranes are normal.  TMs WNL No TTP over sinuses + nasal congestion w/ PND  Eyes: Conjunctivae and EOM are normal. Pupils are equal, round, and reactive to light.  Neck: Normal range of motion. Neck supple.  Cardiovascular: Normal rate, regular rhythm, normal heart sounds and intact distal pulses.   Pulmonary/Chest: Effort normal and breath sounds normal. No respiratory distress. She has no wheezes. She has no rales.  Abdominal: Soft. Bowel sounds are normal. She exhibits no distension. There is no tenderness. There is no rebound.  Musculoskeletal: She exhibits no edema.  Lymphadenopathy:    She has no cervical adenopathy.  Neurological: She is alert and oriented to person, place, and time. She has normal reflexes. No cranial nerve deficit.  Skin: Skin is warm and dry.  Psychiatric: She has a normal mood and affect. Her behavior is normal. Judgment and thought content normal.          Assessment & Plan:

## 2012-10-05 NOTE — Assessment & Plan Note (Signed)
New.  Pt's BP does not drop exceptionally low but it does drop 16 points from lying to sitting and even though the drop remains in the normal range, it is likely relative hypotension for her.  Encouraged increased fluid intake and changing positions slowly.  Will not adjust BP meds at this time unless sxs continue.  Pt expressed understanding and is in agreement w/ plan.

## 2012-10-05 NOTE — Patient Instructions (Addendum)
This is called Orthostatic hypotension and is when you get dizzy b/c your BP drops when you stand up too fast Increase your water intake Change positions slowly Start Claritin or Zyrtec daily to decrease post nasal drip- if you continue to wheeze at night or have shortness of breath, call me! Call with any questions or concerns Enjoy the end of school!!!

## 2012-10-24 HISTORY — PX: BUNIONECTOMY: SHX129

## 2012-10-31 ENCOUNTER — Other Ambulatory Visit: Payer: Self-pay | Admitting: Family Medicine

## 2012-11-07 DIAGNOSIS — T8140XA Infection following a procedure, unspecified, initial encounter: Secondary | ICD-10-CM

## 2012-11-07 HISTORY — DX: Infection following a procedure, unspecified, initial encounter: T81.40XA

## 2012-11-18 ENCOUNTER — Encounter: Payer: Self-pay | Admitting: Family Medicine

## 2012-11-18 ENCOUNTER — Ambulatory Visit (HOSPITAL_BASED_OUTPATIENT_CLINIC_OR_DEPARTMENT_OTHER)
Admission: RE | Admit: 2012-11-18 | Discharge: 2012-11-18 | Disposition: A | Discharge status: Home / Self Care | Payer: BC Managed Care – PPO | Source: Ambulatory Visit | Attending: Family Medicine | Admitting: Family Medicine

## 2012-11-18 ENCOUNTER — Ambulatory Visit (INDEPENDENT_AMBULATORY_CARE_PROVIDER_SITE_OTHER): Payer: BC Managed Care – PPO | Admitting: Family Medicine

## 2012-11-18 VITALS — BP 138/60 | HR 84 | Temp 98.3°F | Ht 64.0 in | Wt 226.0 lb

## 2012-11-18 DIAGNOSIS — Q619 Cystic kidney disease, unspecified: Secondary | ICD-10-CM | POA: Insufficient documentation

## 2012-11-18 DIAGNOSIS — R310 Gross hematuria: Secondary | ICD-10-CM

## 2012-11-18 DIAGNOSIS — I700 Atherosclerosis of aorta: Secondary | ICD-10-CM | POA: Insufficient documentation

## 2012-11-18 DIAGNOSIS — R319 Hematuria, unspecified: Secondary | ICD-10-CM | POA: Insufficient documentation

## 2012-11-18 DIAGNOSIS — K7689 Other specified diseases of liver: Secondary | ICD-10-CM | POA: Insufficient documentation

## 2012-11-18 DIAGNOSIS — R9389 Abnormal findings on diagnostic imaging of other specified body structures: Secondary | ICD-10-CM | POA: Insufficient documentation

## 2012-11-18 LAB — BASIC METABOLIC PANEL
BUN: 17 mg/dL (ref 6–23)
Calcium: 9.4 mg/dL (ref 8.4–10.5)
Glucose, Bld: 165 mg/dL — ABNORMAL HIGH (ref 70–99)
Sodium: 139 mEq/L (ref 135–145)

## 2012-11-18 LAB — POCT URINALYSIS DIPSTICK
Nitrite, UA: NEGATIVE
pH, UA: 6

## 2012-11-18 MED ORDER — CIPROFLOXACIN HCL 500 MG PO TABS: 500.0000 mg | ORAL_TABLET | Freq: Two times a day (BID) | ORAL | Status: DC

## 2012-11-18 MED ORDER — IOHEXOL 300 MG/ML  SOLN
125.0000 mL | Freq: Once | INTRAMUSCULAR | Status: AC | PRN
  Administered 2012-11-18: 125 mL via INTRAVENOUS

## 2012-11-18 NOTE — Patient Instructions (Addendum)
Go to [facility] at 4pm for your CT scan Start the Cipro If severe bleeding, pain, dizziness, etc- GO TO THE ER!! Hang in there!

## 2012-11-18 NOTE — Progress Notes (Signed)
  Subjective:    Patient ID: Lacey [name], female    DOB: [birth year], 62 y.o.   MRN: [id number]  HPI Hematuria- sxs started Wednesday.  No dysuria.  Intermittent LBP.  No fevers.  No N/V/D.  Doesn't feel similar to previous kidney stones.  Denies frequency.  + urgency.  Has seen Dr Vernie Ammons previously.  Has hx of 'kidney bleed' in 1985.   Review of Systems For ROS see HPI     Objective:   Physical Exam  Vitals reviewed. Constitutional: She appears well-developed and well-nourished. No distress.  Abdominal: Soft. She exhibits no distension. There is no tenderness (no suprapubic or CVA tenderness).          Assessment & Plan:

## 2012-11-21 ENCOUNTER — Ambulatory Visit (INDEPENDENT_AMBULATORY_CARE_PROVIDER_SITE_OTHER): Payer: BC Managed Care – PPO | Admitting: Family Medicine

## 2012-11-21 ENCOUNTER — Encounter: Payer: Self-pay | Admitting: Family Medicine

## 2012-11-21 VITALS — BP 128/50 | HR 82 | Temp 98.3°F | Wt 221.8 lb

## 2012-11-21 DIAGNOSIS — T8149XA Infection following a procedure, other surgical site, initial encounter: Secondary | ICD-10-CM | POA: Insufficient documentation

## 2012-11-21 DIAGNOSIS — E785 Hyperlipidemia, unspecified: Secondary | ICD-10-CM

## 2012-11-21 DIAGNOSIS — E1142 Type 2 diabetes mellitus with diabetic polyneuropathy: Secondary | ICD-10-CM

## 2012-11-21 DIAGNOSIS — I509 Heart failure, unspecified: Secondary | ICD-10-CM

## 2012-11-21 DIAGNOSIS — T8140XA Infection following a procedure, unspecified, initial encounter: Secondary | ICD-10-CM

## 2012-11-21 DIAGNOSIS — E1140 Type 2 diabetes mellitus with diabetic neuropathy, unspecified: Secondary | ICD-10-CM

## 2012-11-21 DIAGNOSIS — E1149 Type 2 diabetes mellitus with other diabetic neurological complication: Secondary | ICD-10-CM

## 2012-11-21 DIAGNOSIS — R310 Gross hematuria: Secondary | ICD-10-CM

## 2012-11-21 DIAGNOSIS — Z0000 Encounter for general adult medical examination without abnormal findings: Secondary | ICD-10-CM

## 2012-11-21 DIAGNOSIS — I110 Hypertensive heart disease with heart failure: Secondary | ICD-10-CM

## 2012-11-21 LAB — URINE CULTURE

## 2012-11-21 LAB — CBC WITH DIFFERENTIAL/PLATELET
Basophils Absolute: 0.1 10*3/uL (ref 0.0–0.1)
Lymphocytes Relative: 24.9 % (ref 12.0–46.0)
Lymphs Abs: 2 10*3/uL (ref 0.7–4.0)
Monocytes Relative: 4.9 % (ref 3.0–12.0)
Neutrophils Relative %: 64.4 % (ref 43.0–77.0)
Platelets: 178 10*3/uL (ref 150.0–400.0)
RDW: 14.8 % — ABNORMAL HIGH (ref 11.5–14.6)

## 2012-11-21 LAB — TSH: TSH: 2.53 u[IU]/mL (ref 0.35–5.50)

## 2012-11-21 LAB — HEPATIC FUNCTION PANEL
AST: 29 U/L (ref 0–37)
Alkaline Phosphatase: 58 U/L (ref 39–117)
Bilirubin, Direct: 0 mg/dL (ref 0.0–0.3)
Total Bilirubin: 0.2 mg/dL — ABNORMAL LOW (ref 0.3–1.2)

## 2012-11-21 LAB — LIPID PANEL
HDL: 41.9 mg/dL (ref >39.00)
VLDL: 25.8 mg/dL (ref 0.0–40.0)

## 2012-11-21 LAB — HEMOGLOBIN A1C: Hgb A1c MFr Bld: 7.3 % — ABNORMAL HIGH (ref 4.6–6.5)

## 2012-11-21 NOTE — Patient Instructions (Addendum)
Follow up in 3-4 months to recheck diabetes We'll notify you of your lab results and make any changes if needed Call and schedule your mammo We'll call you with your urology referral Peroxide the foot twice daily and apply neosporin- cover w/ bandaid Call with any questions or concerns Hang in there!

## 2012-11-21 NOTE — Progress Notes (Signed)
  Subjective:    Patient ID: Lacey [name], female    DOB: [birth year], 62 y.o.   MRN: [id number]  HPI CPE- UTD on colonoscopy, DEXA, Mammo, pap (2013).  UTD on eye exam.   Review of Systems Patient reports no vision/ hearing changes, adenopathy,fever, weight change,  persistant/recurrent hoarseness , swallowing issues, chest pain, palpitations, edema, persistant/recurrent cough, hemoptysis, dyspnea (rest/exertional/paroxysmal nocturnal), gastrointestinal bleeding (melena, rectal bleeding), abdominal pain, significant heartburn, bowel changes, Gyn symptoms (abnormal  bleeding, pain),  syncope, focal weakness, memory loss, numbness & tingling, skin/hair/nail changes, abnormal bruising or bleeding, anxiety, or depression.  + hematuria w/ hx of stones and renal bleed, inconclusive CT on Friday 7/18.  Has seen Dr Vernie Ammons.  Having episodes of severe pain that improve w/ passing of large quantities of clot/debris in urine.  L bunionectomy incision- drainage from incision w/ odor.  Initially only drainage blood, now drainage is yellow, more purulent looking.  + pain.  Surrounding redness.  Started Cipro 3 days ago for urinary issues.    Objective:   Physical Exam General Appearance:    Alert, cooperative, no distress, appears stated age  Head:    Normocephalic, without obvious abnormality, atraumatic  Eyes:    PERRL, conjunctiva/corneas clear, EOM's intact, fundi    benign, both eyes  Ears:    Normal TM's and external ear canals, both ears  Nose:   Nares normal, septum midline, mucosa normal, no drainage    or sinus tenderness  Throat:   Lips, mucosa, and tongue normal; teeth and gums normal  Neck:   Supple, symmetrical, trachea midline, no adenopathy;    Thyroid: no enlargement/tenderness/nodules  Back:     Symmetric, no curvature, ROM normal, no CVA tenderness  Lungs:     Clear to auscultation bilaterally, respirations unlabored  Chest Wall:    No tenderness or deformity   Heart:    Regular rate  and rhythm, S1 and S2 normal, no murmur, rub   or gallop  Breast Exam:    Deferred to GYN  Abdomen:     Soft, non-tender, bowel sounds active all four quadrants,    no masses, no organomegaly  Genitalia:    Deferred to GYN  Rectal:    Extremities:   L bunionectomy incision w/ purulent drainage, odor and mild surrounding erythema.  Pulses:   2+ and symmetric all extremities  Skin:   Skin color, texture, turgor normal, no rashes or lesions  Lymph nodes:   Cervical, supraclavicular, and axillary nodes normal  Neurologic:   CNII-XII intact, normal strength, sensation and reflexes    throughout          Assessment & Plan:

## 2012-11-22 ENCOUNTER — Telehealth (HOSPITAL_COMMUNITY): Payer: Self-pay | Admitting: *Deleted

## 2012-11-22 NOTE — Assessment & Plan Note (Signed)
Chronic problem.  Asymptomatic.  Tolerating statin.  Check labs.  Adjust meds prn

## 2012-11-22 NOTE — Assessment & Plan Note (Signed)
BP adequately controlled today.  Check labs.

## 2012-11-22 NOTE — Assessment & Plan Note (Signed)
sxs continue from last week.  Amount of blood has not improved and continues to have episodic severe pain.  Pt's CT was inconclusive from last week.  Will put in for stat urology referral and follow closely.

## 2012-11-22 NOTE — Assessment & Plan Note (Signed)
New.  Bunionectomy incision now infected.  Pt on cipro for urinary issues and this should cover her wound infection.  Area cleaned w/ H2O2, neosporin applied, and band-aid placed.  Pt has f/u w/ surgeon later this week.  Reviewed supportive care and red flags that should prompt return.  Pt expressed understanding and is in agreement w/ plan.

## 2012-11-22 NOTE — Assessment & Plan Note (Signed)
New.  Pt's urine is color of grape juice and has been this way x2 days.  Hx of renal stones and 'kidney bleed'.  No pain currently and pt doesn't feel this is consistent w/ previous stones.  Get BMP to assess Cr.  Get stat CT scan.  If needed refer back to urology.  Start tx w/ Cipro for possible UTI.  Reviewed supportive care and red flags that should prompt return.

## 2012-11-22 NOTE — Assessment & Plan Note (Signed)
Check labs.  Adjust meds prn  

## 2012-11-22 NOTE — Assessment & Plan Note (Signed)
Pt's PE WNL w/ exception of wound infxn and hematuria.  UTD on health maintenance.  Check labs.  Anticipatory guidance provided.

## 2012-11-23 ENCOUNTER — Encounter (HOSPITAL_COMMUNITY): Payer: Self-pay | Admitting: Pharmacy Technician

## 2012-11-23 ENCOUNTER — Other Ambulatory Visit: Payer: Self-pay | Admitting: Urology

## 2012-11-24 ENCOUNTER — Other Ambulatory Visit (HOSPITAL_COMMUNITY): Payer: Self-pay | Admitting: *Deleted

## 2012-11-25 ENCOUNTER — Encounter (HOSPITAL_COMMUNITY): Payer: Self-pay

## 2012-11-25 ENCOUNTER — Encounter (HOSPITAL_COMMUNITY)
Admission: RE | Admit: 2012-11-25 | Discharge: 2012-11-25 | Disposition: A | Discharge status: Home / Self Care | Payer: BC Managed Care – PPO | Source: Ambulatory Visit | Attending: Urology | Admitting: Urology

## 2012-11-25 HISTORY — DX: Pneumonia, unspecified organism: J18.9

## 2012-11-25 HISTORY — DX: Angina pectoris, unspecified: I20.9

## 2012-11-25 HISTORY — DX: Personal history of urinary calculi: Z87.442

## 2012-11-25 LAB — BASIC METABOLIC PANEL
BUN: 14 mg/dL (ref 6–23)
CO2: 25 mEq/L (ref 19–32)
Calcium: 9.4 mg/dL (ref 8.4–10.5)
Chloride: 102 mEq/L (ref 96–112)
Creatinine, Ser: 0.82 mg/dL (ref 0.50–1.10)
GFR calc Af Amer: 87 mL/min — ABNORMAL LOW (ref >90)
GFR calc non Af Amer: 75 mL/min — ABNORMAL LOW (ref >90)
Glucose, Bld: 197 mg/dL — ABNORMAL HIGH (ref 70–99)
Potassium: 4.2 mEq/L (ref 3.5–5.1)
Sodium: 137 mEq/L (ref 135–145)

## 2012-11-25 LAB — CBC
HCT: 38.7 % (ref 36.0–46.0)
Hemoglobin: 12.2 g/dL (ref 12.0–15.0)
MCH: 28.5 pg (ref 26.0–34.0)
MCHC: 31.5 g/dL (ref 30.0–36.0)
MCV: 90.4 fL (ref 78.0–100.0)
Platelets: 183 10*3/uL (ref 150–400)
RBC: 4.28 MIL/uL (ref 3.87–5.11)
RDW: 14.4 % (ref 11.5–15.5)
WBC: 7.3 10*3/uL (ref 4.0–10.5)

## 2012-11-25 NOTE — Patient Instructions (Signed)
20 DANAISHA CELLI  11/25/2012   Your procedure is scheduled on: 11/28/12  Report to [facility] at 5:30 AM.  Call this number if you have problems the morning of surgery [phone]: [phone]   Remember:   Do not eat food or drink liquids After Midnight.     Take these medicines the morning of surgery with A SIP OF WATER: metoprolol   Do not wear jewelry, make-up or nail polish.  Do not wear lotions, powders, or perfumes. You may wear deodorant.  Do not shave 48 hours prior to surgery. Men may shave face and neck.  Do not bring valuables to the hospital.  Contacts, dentures or bridgework may not be worn into surgery.     Patients discharged the day of surgery will not be allowed to drive home.  Name and phone number of your driver: Beckie Busing (husband) [phone]      Birdie Sons, RN  pre op nurse call if needed [phone]    FAILURE TO FOLLOW THESE INSTRUCTIONS MAY RESULT IN CANCELLATION OF YOUR SURGERY   Patient Signature: ___________________________________________

## 2012-11-25 NOTE — Progress Notes (Signed)
CT abd 11/18/12 on [facility]

## 2012-11-27 MED ORDER — CIPROFLOXACIN IN D5W 200 MG/100ML IV SOLN
200.0000 mg | INTRAVENOUS | Status: AC
  Administered 2012-11-28: 200 mg via INTRAVENOUS
  Filled 2012-11-27 (×2): qty 100

## 2012-11-27 NOTE — H&P (Signed)
History of Present Illness            Left nephrolithiasis: She has a long history of left nephrolithiasis dating back to 4/02 at which time she underwent lithotripsy. She then required ureteroscopy and basketing of a stone in 5/04 and then had 2 episodes where she required cystoscopy and stent placement followed by ureteroscopy the last in 10/09. A CT scan done on 02/05/11 revealed no right renal calculi but multiple left renal calculi located in an anterior as well as posterior lower pole calyx as well as the renal pelvis and near the UPJ. The largest stone measuring greater than a centimeter had Hounsfield units of approximately 400. The left kidney itself is atrophic with evidence of scarring. Left percutaneous nephrolithotomy 10/12: At the time of her procedure I was able to clear all of the stones except one. It was located in a parallel lower pole calyx. Left ureteroscopy and laser lithotripsy 12/12: I was able to clear the remaining stones in her kidney at that time.  Interval history: She reports she began having gross hematuria about a week ago or more. It started out like but then progressed and became quite bloody. He then began to pass clots. She found that just before she urinated out a clot she did have fairly significant left flank pain and then when she urinated out the clot her flank pain would resolve. She denies any stones pass. She underwent a CT scan with and without contrast and this revealed bilateral nephrolithiasis but no ureteral stones. On delayed films filling defects were seen within the renal pelvis that were felt to represent either urothelial malignancy or more likely clot. She continues to have gross hematuria although it appears to be improving. She's not having any flank pain at this time. Her gross hematuria would be considered of moderate severity with no modifying factors or other associated signs and symptoms.    Past Medical History Problems  1. History of   Arthritis V13.4 2. History of  Diabetes Mellitus 250.00 3. History of  Esophageal Reflux 530.81 4. History of  Heartburn 787.1 5. History of  Hydronephrosis 591 6. History of  Hypercholesterolemia 272.0 7. History of  Hypertension 401.9 8. History of  Nephrolithiasis V13.01 9. History of  Ureteral Stone 592.1  Surgical History Problems  1. History of  Cholecystectomy 2. History of  Cystoscopy With Insertion Of Ureteral Stent Left 3. History of  Cystoscopy With Insertion Of Ureteral Stent Left 4. History of  Cystoscopy With Insertion Of Ureteral Stent Left 5. History of  Cystoscopy With Ureteroscopy With Lithotripsy Left 6. History of  Cystoscopy With Ureteroscopy With Lithotripsy Left 7. History of  Cystoscopy With Ureteroscopy With Lithotripsy 8. History of  Cystoscopy With Ureteroscopy With Removal Of Calculus Left 9. History of  Hand Surgery 10. History of  High Gastric Bypass 11. History of  Hysterectomy 12. History of  Kidney Surgery 13. History of  Lithotripsy Left 14. History of  Neck Surgery 15. History of  Percutaneous Lithotomy For Stone Over 2cm. 16. History of  Renal Endoscopy Via Nephrostomy With Ureteral Catheterizati  Current Meds 1. Atorvastatin Calcium 20 MG Oral Tablet; Therapy: 30Jan2012 to 2. Cholestyramine 4 GM Oral Packet; Therapy: 23Apr2012 to 3. ClonazePAM 1 MG Oral Tablet; Therapy: 19Jun2012 to 4. Diovan HCT 160-12.5 MG Oral Tablet; Therapy: 21Jul2011 to 5. Hydrocortisone Valerate 0.2 % External Cream; Therapy: 01Jul2011 to 6. Lantus 100 UNIT/ML Subcutaneous Solution; Therapy: (Recorded:05Oct2012) to 7. MetFORMIN HCl 1000 MG Oral Tablet;  Therapy: 30Oct2012 to 8. MetFORMIN HCl 500 MG Oral Tablet; Therapy: (Recorded:05Oct2012) to 9. Metoprolol Succinate ER 50 MG Oral Tablet Extended Release 24 Hour; Therapy: 13Jun2012 to 10. Nitrofurantoin Monohyd Macro 100 MG Oral Capsule; TAKE 1 CAPSULE AT BEDTIME; Therapy:   03Mar2011 to (Evaluate:21Mar2011); Last  Rx:03Mar2011 11. NovoLOG 100 UNIT/ML Subcutaneous Solution; Therapy: (Recorded:05Oct2012) to 12. Trilipix 135 MG Oral Capsule Delayed Release; Therapy: 18Oct2010 to 13. Zolpidem Tartrate ER 6.25 MG Oral Tablet Extended Release; Therapy: 19Jun2012 to  Allergies Medication  1. No Known Drug Allergies  Family History Problems  1. Paternal history of  Death In The Family Father Respiratory failure 2. Family history of  Family Health Status Number Of Children 2-sons 1-daughter 3. Paternal history of  Heart Disease V17.49 4. Maternal history of  Heart Disease V17.49  Social History Problems  1. Marital History - Currently Married 2. Occupation: office work 3. Tobacco Use V15.82 Denied  4. Alcohol Use 5. Caffeine Use   No change in family or social history as noted.   Review of Systems Genitourinary and gastrointestinal system(s) were reviewed and pertinent findings if present are noted.  Genitourinary: hematuria.  Gastrointestinal: flank pain.    Vitals Vital Signs [Data Includes: Last 1 Day]  22Jul2014 02:05PM  BMI Calculated: 40.48 BSA Calculated: 1.99 Height: 5 ft 2 in Weight: 220 lb  Blood Pressure: 130 / 51 Heart Rate: 84  Physical Exam Constitutional: Well nourished and well developed . No acute distress.  ENT:. The ears and nose are normal in appearance.  Neck: The appearance of the neck is normal and no neck mass is present.  Pulmonary: No respiratory distress and normal respiratory rhythm and effort.  Cardiovascular: Heart rate and rhythm are normal . No peripheral edema.  Abdomen: The abdomen is obese. The abdomen is soft and nontender. No masses are palpated. No CVA tenderness. No hernias are palpable. No hepatosplenomegaly noted.  Lymphatics: The femoral and inguinal nodes are not enlarged or tender.  Skin: Normal skin turgor, no visible rash and no visible skin lesions.  Neuro/Psych:. Mood and affect are appropriate.    Results/Data  The following  images/tracing/specimen were independently visualized:  CT scan as above.  The following clinical lab reports were reviewed:  Her urine had some red cells. It did not appear infected.  The following radiology reports were reviewed: CT scan.    Assessment Assessed  1. Gross Hematuria 599.71 2. Nephrolithiasis Left 592.0   I have discussed with the patient the fact that she clearly has had gross hematuria with classic clot colic from the left-hand side and has had an abnormality noted within the renal pelvis on the left. I have been in her kidney as recently as 2 years ago and no abnormality was seen. She does have stones in the kidney and nose are possibly the source of her hematuria however I told her that I was not willing to attribute her gross hematuria to stones in the periphery of her kidney. We therefore discussed diagnostic ureteroscopy, left retrograde pyelogram and possible laser lithotripsy of any stones that appeared to be causing bleeding. I went over the procedure with her in detail including its risks and complications, the alternatives such as repeating the CT scan and the anticipated postoperative course. She understands and elected to proceed.   Plan    She will be scheduled for cystoscopy, left retrograde pyelogram with left ureteroscopy and possible laser lithotripsy versus biopsy.

## 2012-11-28 ENCOUNTER — Encounter (HOSPITAL_COMMUNITY): Payer: Self-pay | Admitting: Anesthesiology

## 2012-11-28 ENCOUNTER — Encounter (HOSPITAL_COMMUNITY)
Admission: RE | Disposition: A | Discharge status: Home / Self Care | Payer: Self-pay | Source: Ambulatory Visit | Attending: Urology

## 2012-11-28 ENCOUNTER — Ambulatory Visit (HOSPITAL_COMMUNITY): Payer: BC Managed Care – PPO

## 2012-11-28 ENCOUNTER — Ambulatory Visit (HOSPITAL_COMMUNITY)
Admission: RE | Admit: 2012-11-28 | Discharge: 2012-11-28 | Disposition: A | Discharge status: Home / Self Care | Payer: BC Managed Care – PPO | Source: Ambulatory Visit | Attending: Urology | Admitting: Urology

## 2012-11-28 ENCOUNTER — Encounter (HOSPITAL_COMMUNITY): Payer: Self-pay

## 2012-11-28 ENCOUNTER — Ambulatory Visit (HOSPITAL_COMMUNITY): Payer: BC Managed Care – PPO | Admitting: Anesthesiology

## 2012-11-28 DIAGNOSIS — K219 Gastro-esophageal reflux disease without esophagitis: Secondary | ICD-10-CM | POA: Insufficient documentation

## 2012-11-28 DIAGNOSIS — R310 Gross hematuria: Secondary | ICD-10-CM

## 2012-11-28 DIAGNOSIS — Z01812 Encounter for preprocedural laboratory examination: Secondary | ICD-10-CM | POA: Insufficient documentation

## 2012-11-28 DIAGNOSIS — N200 Calculus of kidney: Secondary | ICD-10-CM | POA: Insufficient documentation

## 2012-11-28 DIAGNOSIS — Z8701 Personal history of pneumonia (recurrent): Secondary | ICD-10-CM | POA: Insufficient documentation

## 2012-11-28 DIAGNOSIS — Z01810 Encounter for preprocedural cardiovascular examination: Secondary | ICD-10-CM | POA: Insufficient documentation

## 2012-11-28 DIAGNOSIS — Z6841 Body Mass Index (BMI) 40.0 and over, adult: Secondary | ICD-10-CM | POA: Insufficient documentation

## 2012-11-28 DIAGNOSIS — I129 Hypertensive chronic kidney disease with stage 1 through stage 4 chronic kidney disease, or unspecified chronic kidney disease: Secondary | ICD-10-CM | POA: Insufficient documentation

## 2012-11-28 DIAGNOSIS — E669 Obesity, unspecified: Secondary | ICD-10-CM | POA: Insufficient documentation

## 2012-11-28 DIAGNOSIS — I509 Heart failure, unspecified: Secondary | ICD-10-CM | POA: Insufficient documentation

## 2012-11-28 DIAGNOSIS — N189 Chronic kidney disease, unspecified: Secondary | ICD-10-CM | POA: Insufficient documentation

## 2012-11-28 DIAGNOSIS — Z79899 Other long term (current) drug therapy: Secondary | ICD-10-CM | POA: Insufficient documentation

## 2012-11-28 DIAGNOSIS — M129 Arthropathy, unspecified: Secondary | ICD-10-CM | POA: Insufficient documentation

## 2012-11-28 DIAGNOSIS — Z794 Long term (current) use of insulin: Secondary | ICD-10-CM | POA: Insufficient documentation

## 2012-11-28 DIAGNOSIS — E7800 Pure hypercholesterolemia, unspecified: Secondary | ICD-10-CM | POA: Insufficient documentation

## 2012-11-28 DIAGNOSIS — E119 Type 2 diabetes mellitus without complications: Secondary | ICD-10-CM | POA: Insufficient documentation

## 2012-11-28 DIAGNOSIS — I498 Other specified cardiac arrhythmias: Secondary | ICD-10-CM | POA: Insufficient documentation

## 2012-11-28 HISTORY — PX: CYSTOSCOPY WITH RETROGRADE PYELOGRAM, URETEROSCOPY AND STENT PLACEMENT: SHX5789

## 2012-11-28 HISTORY — DX: Infection following a procedure, unspecified, initial encounter: T81.40XA

## 2012-11-28 LAB — GLUCOSE, CAPILLARY
Glucose-Capillary: 149 mg/dL — ABNORMAL HIGH (ref 70–99)
Glucose-Capillary: 137 mg/dL — ABNORMAL HIGH (ref 70–99)

## 2012-11-28 SURGERY — CYSTOURETEROSCOPY, WITH RETROGRADE PYELOGRAM AND STENT INSERTION
Anesthesia: General | Laterality: Left | Wound class: Clean Contaminated

## 2012-11-28 MED ORDER — HYDROMORPHONE HCL PF 1 MG/ML IJ SOLN: 0.2500 mg | INTRAMUSCULAR | Status: DC | PRN

## 2012-11-28 MED ORDER — LACTATED RINGERS IV SOLN: INTRAVENOUS | Status: DC

## 2012-11-28 MED ORDER — OXYCODONE-ACETAMINOPHEN 10-325 MG PO TABS: 1.0000 | ORAL_TABLET | ORAL | Status: DC | PRN

## 2012-11-28 MED ORDER — PHENAZOPYRIDINE HCL 200 MG PO TABS
200.0000 mg | ORAL_TABLET | Freq: Once | ORAL | Status: AC
  Administered 2012-11-28: 200 mg via ORAL
  Filled 2012-11-28: qty 1

## 2012-11-28 MED ORDER — MIDAZOLAM HCL 5 MG/5ML IJ SOLN
INTRAMUSCULAR | Status: DC | PRN
  Administered 2012-11-28: 2 mg via INTRAVENOUS

## 2012-11-28 MED ORDER — MEPERIDINE HCL 50 MG/ML IJ SOLN: 6.2500 mg | INTRAMUSCULAR | Status: DC | PRN

## 2012-11-28 MED ORDER — PROMETHAZINE HCL 25 MG/ML IJ SOLN: 6.2500 mg | INTRAMUSCULAR | Status: DC | PRN

## 2012-11-28 MED ORDER — OXYCODONE HCL 5 MG/5ML PO SOLN
5.0000 mg | Freq: Once | ORAL | Status: AC | PRN
  Filled 2012-11-28: qty 5

## 2012-11-28 MED ORDER — ACETAMINOPHEN 10 MG/ML IV SOLN
1000.0000 mg | Freq: Once | INTRAVENOUS | Status: DC | PRN
  Filled 2012-11-28: qty 100

## 2012-11-28 MED ORDER — IOHEXOL 300 MG/ML  SOLN
INTRAMUSCULAR | Status: AC
  Filled 2012-11-28: qty 1

## 2012-11-28 MED ORDER — LACTATED RINGERS IV SOLN
INTRAVENOUS | Status: DC | PRN
  Administered 2012-11-28: 07:00:00 via INTRAVENOUS

## 2012-11-28 MED ORDER — OXYCODONE HCL 5 MG PO TABS
5.0000 mg | ORAL_TABLET | Freq: Once | ORAL | Status: AC | PRN
Start: 2012-11-28 — End: 2012-11-28
  Administered 2012-11-28: 5 mg via ORAL
  Filled 2012-11-28: qty 1

## 2012-11-28 MED ORDER — PROPOFOL 10 MG/ML IV BOLUS
INTRAVENOUS | Status: DC | PRN
  Administered 2012-11-28: 150 mg via INTRAVENOUS

## 2012-11-28 MED ORDER — ONDANSETRON HCL 4 MG/2ML IJ SOLN
INTRAMUSCULAR | Status: DC | PRN
  Administered 2012-11-28: 4 mg via INTRAVENOUS

## 2012-11-28 MED ORDER — TAMSULOSIN HCL 0.4 MG PO CAPS
0.4000 mg | ORAL_CAPSULE | Freq: Once | ORAL | Status: AC
  Administered 2012-11-28: 0.4 mg via ORAL
  Filled 2012-11-28: qty 1

## 2012-11-28 MED ORDER — FENTANYL CITRATE 0.05 MG/ML IJ SOLN
INTRAMUSCULAR | Status: DC | PRN
  Administered 2012-11-28: 100 ug via INTRAVENOUS

## 2012-11-28 SURGICAL SUPPLY — 19 items
BAG URO CATCHER STRL LF (DRAPE) ×2 IMPLANT
BASKET ZERO TIP NITINOL 2.4FR (BASKET) IMPLANT
BSKT STON RTRVL ZERO TP 2.4FR (BASKET)
CATH INTERMIT  6FR 70CM (CATHETERS) ×2 IMPLANT
CATH URET 5FR 28IN OPEN ENDED (CATHETERS) IMPLANT
CLOTH BEACON ORANGE TIMEOUT ST (SAFETY) ×2 IMPLANT
DRAPE CAMERA CLOSED 9X96 (DRAPES) ×2 IMPLANT
GLOVE BIOGEL M 8.0 STRL (GLOVE) ×6 IMPLANT
GLOVE SURG SS PI 8.0 STRL IVOR (GLOVE) IMPLANT
GOWN PREVENTION PLUS XLARGE (GOWN DISPOSABLE) ×2 IMPLANT
GOWN STRL REIN XL XLG (GOWN DISPOSABLE) ×2 IMPLANT
GUIDEWIRE ANG ZIPWIRE 038X150 (WIRE) IMPLANT
GUIDEWIRE STR DUAL SENSOR (WIRE) ×2 IMPLANT
MANIFOLD NEPTUNE II (INSTRUMENTS) ×2 IMPLANT
MARKER SKIN DUAL TIP RULER LAB (MISCELLANEOUS) IMPLANT
PACK CYSTO (CUSTOM PROCEDURE TRAY) ×2 IMPLANT
SHEATH ACCESS URETERAL 24CM (SHEATH) ×2 IMPLANT
TUBING CONNECTING 10 (TUBING) ×2 IMPLANT
WIRE COONS/BENSON .038X145CM (WIRE) IMPLANT

## 2012-11-28 NOTE — Anesthesia Preprocedure Evaluation (Signed)
Anesthesia Evaluation  Patient identified by MRN, date of birth, ID band Patient awake    Reviewed: Allergy & Precautions, H&P , NPO status , Patient's Chart, lab work & pertinent test results  Airway Mallampati: II TM Distance: >3 FB Neck ROM: Full    Dental no notable dental hx. (+) Dental Advisory Given and Teeth Intact Missing many rear teeth:   Pulmonary shortness of breath, pneumonia -, Current Smoker,  breath sounds clear to auscultation  Pulmonary exam normal       Cardiovascular hypertension, Pt. on home beta blockers and Pt. on medications + angina +CHF negative cardio ROS  + dysrhythmias Supra Ventricular Tachycardia Rhythm:Regular Rate:Normal  Clearance Dr. Graciela Husbands; expects tachycardia   Neuro/Psych  Neuromuscular disease negative psych ROS   GI/Hepatic negative GI ROS, Neg liver ROS, GERD-  ,  Endo/Other  diabetes, Type 2, Oral Hypoglycemic AgentsMorbid obesity  Renal/GU negative Renal ROS     Musculoskeletal negative musculoskeletal ROS (+)   Abdominal (+) + obese,   Peds  Hematology negative hematology ROS (+)   Anesthesia Other Findings   Reproductive/Obstetrics negative OB ROS                           Anesthesia Physical  Anesthesia Plan  ASA: III  Anesthesia Plan: General   Post-op Pain Management:    Induction: Intravenous  Airway Management Planned: LMA  Additional Equipment:   Intra-op Plan:   Post-operative Plan: Extubation in OR  Informed Consent: I have reviewed the patients History and Physical, chart, labs and discussed the procedure including the risks, benefits and alternatives for the proposed anesthesia with the patient or authorized representative who has indicated his/her understanding and acceptance.   Dental advisory given  Plan Discussed with: CRNA  Anesthesia Plan Comments:         Anesthesia Quick Evaluation

## 2012-11-28 NOTE — Transfer of Care (Signed)
Immediate Anesthesia Transfer of Care Note  Patient: Lacey [name]  Procedure(s) Performed: Procedure(s): LEFT RETROGRADE PYELOGRAM, LEFT URETEROSCOPY,  (Left)  Patient Location: PACU  Anesthesia Type:General  Level of Consciousness: awake, alert , oriented and patient cooperative  Airway & Oxygen Therapy: Patient Spontanous Breathing and Patient connected to face mask oxygen  Post-op Assessment: Report given to PACU RN and Post -op Vital signs reviewed and stable  Post vital signs: Reviewed and stable  Complications: No apparent anesthesia complications

## 2012-11-28 NOTE — Anesthesia Postprocedure Evaluation (Signed)
Anesthesia Post Note  Patient: Lacey [name]  Procedure(s) Performed: Procedure(s) (LRB): LEFT RETROGRADE PYELOGRAM, LEFT URETEROSCOPY,  (Left)  Anesthesia type: General  Patient location: PACU  Post pain: Pain level controlled  Post assessment: Post-op Vital signs reviewed  Last Vitals: BP 115/52  Pulse 67  Temp(Src) 36.6 C  Resp 20  SpO2 97%  LMP 06/06/1993  Post vital signs: Reviewed  Level of consciousness: sedated  Complications: No apparent anesthesia complications

## 2012-11-28 NOTE — Progress Notes (Signed)
Pt wishes to go home.  She has voided without difficulty. She is tolerating liquids and has had the prescribed medications prior to discharge. Husband at pt's bedside. Warm compress also given to pt to place on lower abdomen.

## 2012-11-28 NOTE — Op Note (Signed)
PATIENT:  Lacey [name]  PRE-OPERATIVE DIAGNOSIS: 1. Gross hematuria. 2. Left renal pelvic filling defects. 3. History of left nephrolithiasis.  POST-OPERATIVE DIAGNOSIS: Same  PROCEDURE:  1. Cystoscopy with left retrograde pyelogram including interpretation. 2. Left diagnostic ureteroscopy.  SURGEON: Garnett Farm, MD  INDICATION: Lacey [name] is a 62 year old female patient who recently experienced gross hematuria with passage of clots. She underwent a CT scan which revealed no ureteral calculi but delayed films revealed filling defects within the renal pelvis on the left-hand side that were felt to represent either clot or urothelial malignancy. We discussed ureteroscopic evaluation in order to fully evaluate the source of her bleeding and the abnormality noted on her CT scan.  ANESTHESIA:  General  EBL: None  DRAINS: None  SPECIMEN:  None   DESCRIPTION OF PROCEDURE: The patient was taken to the major OR and placed on the table. General anesthesia was administered and then the patient was moved to the dorsal lithotomy position. The genitalia was sterilely prepped and draped. An official timeout was performed.  Initially the 22 French cystoscope with 12 lens was passed under direct vision. The bladder was then entered and fully inspected. It was noted be free of any tumors stones or inflammatory lesions. Ureteral orifices were of normal configuration and position. A 6 French open-ended ureteral catheter was then passed through the cystoscope into the ureteral orifice in order to perform a left retrograde pyelogram.  A retrograde pyelogram was performed by injecting full-strength contrast up the left ureter under direct fluoroscopic control. It revealed a normal appearing ureter throughout its course with no evidence of hydronephrosis or filling defect. The renal pelvis was also free of any filling defects or abnormalities. The collecting system revealed what appeared to be a slightly  greater than usual number of calyces however there were no definite filling defects or mass effect noted. I then passed a 0.038 inch floppy-tipped guidewire through the open ended catheter and into the area of the renal pelvis and this was left in place. Over the guidewire I passed a 24 cm ureteral access sheath. I then proceeded with ureteroscopy.  A 6 French flexible, digital ureteroscope was then passed over the guidewire and into the area of the renal pelvis. The guidewire was then removed. I then performed a systematic inspection first of the renal pelvis with no abnormality or filling defect having been noted. There was no evidence of bleeding. I then inspected each and every calyx. This was performed with the aid of fluoroscopy to confirm the location of the ureteroscope as each calyx was visualized. There were no stones noted in any of the calyces except in a lower pole calyx where there were a few submillimeter calcifications that appeared to be adherent to the renal papilla. I also noted there appeared to be narrowing of a middle pole calyx that barely allowed my scope access. I was however able to gain access and visualize the calyx and papilla and there were noted be normal. I then advanced the scope down the ureter under direct vision and noted that the entire length of the ureter was visibly normal in appearance. Because the procedure was performed with very little ureteral trauma I elected not to place a double-J stent. The patient tolerated the procedure well no intraoperative complications.  PLAN OF CARE: Discharge to home after PACU  PATIENT DISPOSITION:  PACU - hemodynamically stable.

## 2012-11-28 NOTE — Preoperative (Signed)
Beta Blockers   Reason not to administer Beta Blockers:Not Applicable 

## 2012-11-28 NOTE — Interval H&P Note (Signed)
History and Physical Interval Note:  11/28/2012 7:09 AM  Lacey [name]  has presented today for surgery, with the diagnosis of LEFT RENAL FILLING DEFECTS AND GROSS HEMATURIA  The various methods of treatment have been discussed with the patient and family. After consideration of risks, benefits and other options for treatment, the patient has consented to  Procedure(s): LEFT RETROGRADE PYELOGRAM, LEFT URETEROSCOPY, POSSIBLE LASER LITHO (Left) HOLMIUM LASER APPLICATION (Left) as a surgical intervention .  The patient's history has been reviewed, patient examined, no change in status, stable for surgery.  I have reviewed the patient's chart and labs.  Questions were answered to the patient's satisfaction.     Garnett Farm

## 2012-11-29 ENCOUNTER — Encounter (HOSPITAL_COMMUNITY): Payer: Self-pay | Admitting: Urology

## 2012-12-09 LAB — HM MAMMOGRAPHY: HM Mammogram: NORMAL

## 2012-12-15 ENCOUNTER — Other Ambulatory Visit: Payer: Self-pay | Admitting: Family Medicine

## 2012-12-15 ENCOUNTER — Other Ambulatory Visit: Payer: Self-pay | Admitting: Internal Medicine

## 2012-12-15 NOTE — Telephone Encounter (Signed)
Last refill:08-30-12 Last CPE:11-21-12 UDS:06-23-12-Low Risk Please advise.//AB/CMA

## 2012-12-16 ENCOUNTER — Telehealth: Payer: Self-pay | Admitting: *Deleted

## 2012-12-16 MED ORDER — CLONAZEPAM 1 MG PO TABS: 1.0000 mg | ORAL_TABLET | Freq: Every day | ORAL | Status: DC

## 2012-12-16 NOTE — Telephone Encounter (Signed)
Ok for #90, 1 refill 

## 2012-12-16 NOTE — Telephone Encounter (Signed)
Pharmacy is requesting a refill for a 90 day supply of clonazepam 1 mg.  Last ov was 11/21/12 last date filled 06/24/12 #90 1 R. Last UDS 06/23/12.  Patient has a controlled substance contract on file and she's low risk.  Also patient now uses the [employer].  AG cma

## 2012-12-19 MED ORDER — CLONAZEPAM 1 MG PO TABS: 1.0000 mg | ORAL_TABLET | Freq: Every day | ORAL | Status: DC

## 2012-12-19 NOTE — Telephone Encounter (Signed)
Med filled #90 per [place].

## 2012-12-19 NOTE — Addendum Note (Signed)
Addended by: Jackson Latino on: 12/19/2012 08:54 AM   Modules accepted: Orders

## 2013-01-21 ENCOUNTER — Other Ambulatory Visit: Payer: Self-pay | Admitting: Family Medicine

## 2013-01-24 ENCOUNTER — Ambulatory Visit (INDEPENDENT_AMBULATORY_CARE_PROVIDER_SITE_OTHER): Payer: BC Managed Care – PPO

## 2013-01-24 ENCOUNTER — Encounter: Payer: Self-pay | Admitting: *Deleted

## 2013-01-24 DIAGNOSIS — Z23 Encounter for immunization: Secondary | ICD-10-CM

## 2013-01-25 NOTE — Telephone Encounter (Signed)
Rx filled and sent to pharmacy.  

## 2013-02-07 ENCOUNTER — Other Ambulatory Visit: Payer: Self-pay | Admitting: Family Medicine

## 2013-02-07 NOTE — Telephone Encounter (Signed)
Med filled.  

## 2013-02-21 ENCOUNTER — Other Ambulatory Visit: Payer: Self-pay | Admitting: Family Medicine

## 2013-02-22 NOTE — Telephone Encounter (Signed)
Med filled.  

## 2013-02-28 ENCOUNTER — Encounter: Payer: Self-pay | Admitting: Family Medicine

## 2013-03-01 ENCOUNTER — Ambulatory Visit (INDEPENDENT_AMBULATORY_CARE_PROVIDER_SITE_OTHER): Payer: BC Managed Care – PPO | Admitting: Family Medicine

## 2013-03-01 ENCOUNTER — Telehealth: Payer: Self-pay | Admitting: General Practice

## 2013-03-01 ENCOUNTER — Encounter: Payer: Self-pay | Admitting: Family Medicine

## 2013-03-01 VITALS — BP 118/70 | HR 68 | Temp 98.2°F | Resp 16 | Wt 228.0 lb

## 2013-03-01 DIAGNOSIS — E1142 Type 2 diabetes mellitus with diabetic polyneuropathy: Secondary | ICD-10-CM

## 2013-03-01 DIAGNOSIS — E1149 Type 2 diabetes mellitus with other diabetic neurological complication: Secondary | ICD-10-CM

## 2013-03-01 DIAGNOSIS — E1140 Type 2 diabetes mellitus with diabetic neuropathy, unspecified: Secondary | ICD-10-CM

## 2013-03-01 LAB — BASIC METABOLIC PANEL
CO2: 24 mEq/L (ref 19–32)
Calcium: 9.2 mg/dL (ref 8.4–10.5)
Creatinine, Ser: 0.7 mg/dL (ref 0.4–1.2)
Glucose, Bld: 90 mg/dL (ref 70–99)

## 2013-03-01 LAB — HEMOGLOBIN A1C: Hgb A1c MFr Bld: 6.6 % — ABNORMAL HIGH (ref 4.6–6.5)

## 2013-03-01 MED ORDER — HYDROCORTISONE VALERATE 0.2 % EX CREA: 1.0000 "application " | TOPICAL_CREAM | Freq: Two times a day (BID) | CUTANEOUS | Status: DC

## 2013-03-01 NOTE — Telephone Encounter (Signed)
Ok for refill on hydrocortisone (Westcort) cream.  Does pt want mail order or local?

## 2013-03-01 NOTE — Assessment & Plan Note (Signed)
Chronic problem.  Pt feels CBGs are well controlled.  UTD on eye exam.  Check labs.  Adjust meds prn

## 2013-03-01 NOTE — Progress Notes (Signed)
  Subjective:    Patient ID: Lacey [name], female    DOB: 1951/03/28, 62 y.o.   MRN: [id number]  HPI DM- chronic problem, on Lantus and novolog.  Also on Metformin.  UTD on eye exam.  Reports CBGs are 'really good'- 'sometimes double digits'.  Has had 3 symptomatic lows.  No CP, SOB, HAs, visual changes, edema.  Following w/ podiatry.  Neuropathy is not progressing.   Review of Systems For ROS see HPI     Objective:   Physical Exam  Vitals reviewed. Constitutional: She is oriented to person, place, and time. She appears well-developed and well-nourished. No distress.  HENT:  Head: Normocephalic and atraumatic.  Eyes: Conjunctivae and EOM are normal. Pupils are equal, round, and reactive to light.  Neck: Normal range of motion. Neck supple. No thyromegaly present.  Cardiovascular: Normal rate, regular rhythm, normal heart sounds and intact distal pulses.   No murmur heard. Pulmonary/Chest: Effort normal and breath sounds normal. No respiratory distress.  Abdominal: Soft. She exhibits no distension. There is no tenderness.  Musculoskeletal: She exhibits no edema.  Lymphadenopathy:    She has no cervical adenopathy.  Neurological: She is alert and oriented to person, place, and time.  Skin: Skin is warm and dry.  Psychiatric: She has a normal mood and affect. Her behavior is normal.          Assessment & Plan:

## 2013-03-01 NOTE — Patient Instructions (Signed)
Follow up in 3-4 months to recheck sugar and cholesterol We'll notify you of your lab results and make any changes if needed Keep up the good work!  You look great! Call with any questions or concerns Happy Anniversary!

## 2013-03-01 NOTE — Telephone Encounter (Signed)
Pt asked if we could refill her hydrocortisone cream. Please advise.

## 2013-03-01 NOTE — Telephone Encounter (Signed)
Med filled to mail order, pt notified.

## 2013-03-09 ENCOUNTER — Other Ambulatory Visit: Payer: Self-pay

## 2013-04-13 ENCOUNTER — Encounter: Payer: Self-pay | Admitting: Family Medicine

## 2013-04-26 ENCOUNTER — Other Ambulatory Visit: Payer: Self-pay | Admitting: Family Medicine

## 2013-04-26 ENCOUNTER — Telehealth: Payer: Self-pay | Admitting: *Deleted

## 2013-04-26 NOTE — Telephone Encounter (Signed)
Patient called to inform dr Beverely Low what dr Faythe Ghee said at her last visit. Patient has a pinch nerve at the bottom of her back. Dr Faythe Ghee is going to give her prednisone for inflammation but wants patient to follow up with dr Beverely Low about adjusting her insulin incase her blood sugar starts to increase.

## 2013-04-26 NOTE — Telephone Encounter (Signed)
Pt should schedule an appt ~1 week after starting prednisone and check CBGs regularly at home

## 2013-04-26 NOTE — Telephone Encounter (Signed)
Called and lmovm to inform pt.  

## 2013-04-28 NOTE — Telephone Encounter (Signed)
Med filled.  

## 2013-05-03 ENCOUNTER — Encounter: Payer: Self-pay | Admitting: Family Medicine

## 2013-05-03 ENCOUNTER — Ambulatory Visit (INDEPENDENT_AMBULATORY_CARE_PROVIDER_SITE_OTHER): Payer: BC Managed Care – PPO | Admitting: Family Medicine

## 2013-05-03 VITALS — BP 124/72 | HR 66 | Temp 98.5°F | Resp 16 | Wt 225.2 lb

## 2013-05-03 DIAGNOSIS — E1140 Type 2 diabetes mellitus with diabetic neuropathy, unspecified: Secondary | ICD-10-CM

## 2013-05-03 DIAGNOSIS — E1142 Type 2 diabetes mellitus with diabetic polyneuropathy: Secondary | ICD-10-CM

## 2013-05-03 DIAGNOSIS — E1149 Type 2 diabetes mellitus with other diabetic neurological complication: Secondary | ICD-10-CM

## 2013-05-03 NOTE — Progress Notes (Signed)
Pre visit review using our clinic review tool, if applicable. No additional management support is needed unless otherwise documented below in the visit note. 

## 2013-05-03 NOTE — Assessment & Plan Note (Signed)
Pt's CBGs were initially high w/ pred taper but these have normalized.  In fact, pt is actually having symptomatic lows.  Asked her to keep track of how many lows she is having and to decrease her insulin by 3-5 units if persistent.  Pt in agreement.

## 2013-05-03 NOTE — Patient Instructions (Signed)
Follow up as scheduled Keep track of how many times you drop low If you start to drop low regularly, decrease your insulin by 3-5 units Call with any questions or concerns Happy New Year and Happy [place] Ouch!!

## 2013-05-03 NOTE — Progress Notes (Signed)
   Subjective:    Patient ID: Lacey [name], female    DOB: Sep 15, 1950, 62 y.o.   MRN: [id number]  HPI Diabetes- Ortho started pt on Prednisone.  Has finished course.  1st day of prednisone CBGs went as high as 400+.  Took 2-3 extra shots of insulin that day.  Fasting CBG was 88 today.  Yesterday CBGs were pretty stable, highest was 200.  Prior to the steroids pt's CBGs were excellent- even starting to run low.  Had multiple episodes of symptomatic lows   Review of Systems For ROS see HPI     Objective:   Physical Exam  Vitals reviewed. Constitutional: She is oriented to person, place, and time. She appears well-developed and well-nourished. No distress.  HENT:  Head: Normocephalic and atraumatic.  Cardiovascular: Normal rate, regular rhythm and normal heart sounds.   Pulmonary/Chest: Effort normal and breath sounds normal. No respiratory distress. She has no wheezes. She has no rales.  Neurological: She is alert and oriented to person, place, and time.  Skin: Skin is warm and dry.  Psychiatric: She has a normal mood and affect. Her behavior is normal.          Assessment & Plan:

## 2013-06-01 ENCOUNTER — Encounter: Payer: Self-pay | Admitting: Family Medicine

## 2013-06-01 ENCOUNTER — Ambulatory Visit (INDEPENDENT_AMBULATORY_CARE_PROVIDER_SITE_OTHER): Payer: BC Managed Care – PPO | Admitting: Family Medicine

## 2013-06-01 ENCOUNTER — Other Ambulatory Visit: Payer: Self-pay | Admitting: Internal Medicine

## 2013-06-01 VITALS — BP 134/80 | HR 73 | Temp 98.4°F | Resp 18 | Wt 233.0 lb

## 2013-06-01 DIAGNOSIS — E1165 Type 2 diabetes mellitus with hyperglycemia: Secondary | ICD-10-CM

## 2013-06-01 DIAGNOSIS — I509 Heart failure, unspecified: Secondary | ICD-10-CM

## 2013-06-01 DIAGNOSIS — R079 Chest pain, unspecified: Secondary | ICD-10-CM

## 2013-06-01 DIAGNOSIS — I110 Hypertensive heart disease with heart failure: Secondary | ICD-10-CM

## 2013-06-01 DIAGNOSIS — IMO0002 Reserved for concepts with insufficient information to code with codable children: Secondary | ICD-10-CM

## 2013-06-01 DIAGNOSIS — E1149 Type 2 diabetes mellitus with other diabetic neurological complication: Secondary | ICD-10-CM

## 2013-06-01 DIAGNOSIS — E785 Hyperlipidemia, unspecified: Secondary | ICD-10-CM

## 2013-06-01 DIAGNOSIS — E1140 Type 2 diabetes mellitus with diabetic neuropathy, unspecified: Secondary | ICD-10-CM

## 2013-06-01 DIAGNOSIS — E1142 Type 2 diabetes mellitus with diabetic polyneuropathy: Secondary | ICD-10-CM

## 2013-06-01 LAB — HEPATIC FUNCTION PANEL
ALBUMIN: 3.8 g/dL (ref 3.5–5.2)
ALK PHOS: 56 U/L (ref 39–117)
ALT: 23 U/L (ref 0–35)
AST: 28 U/L (ref 0–37)
Bilirubin, Direct: 0 mg/dL (ref 0.0–0.3)
TOTAL PROTEIN: 6.7 g/dL (ref 6.0–8.3)
Total Bilirubin: 0.4 mg/dL (ref 0.3–1.2)

## 2013-06-01 LAB — LIPID PANEL
CHOL/HDL RATIO: 3
CHOLESTEROL: 106 mg/dL (ref 0–200)
HDL: 36.9 mg/dL — AB (ref >39.00)
LDL CALC: 38 mg/dL (ref 0–99)
TRIGLYCERIDES: 157 mg/dL — AB (ref 0.0–149.0)
VLDL: 31.4 mg/dL (ref 0.0–40.0)

## 2013-06-01 LAB — BASIC METABOLIC PANEL
BUN: 15 mg/dL (ref 6–23)
CALCIUM: 9 mg/dL (ref 8.4–10.5)
CHLORIDE: 106 meq/L (ref 96–112)
CO2: 25 meq/L (ref 19–32)
Creatinine, Ser: 0.7 mg/dL (ref 0.4–1.2)
GFR: 84.23 mL/min (ref >60.00)
GLUCOSE: 98 mg/dL (ref 70–99)
Potassium: 4 mEq/L (ref 3.5–5.1)
SODIUM: 139 meq/L (ref 135–145)

## 2013-06-01 LAB — HEMOGLOBIN A1C: Hgb A1c MFr Bld: 6.9 % — ABNORMAL HIGH (ref 4.6–6.5)

## 2013-06-01 NOTE — Assessment & Plan Note (Signed)
Chronic problem.  UTD on eye exam.  Currently asymptomatic.  Check labs.  Adjust meds prn

## 2013-06-01 NOTE — Assessment & Plan Note (Signed)
Recurrent problem for pt.  Has multiple cardiac risk factors- DM, HTN, Hyperlipidemia.  Pt is asymptomatic in office today.  Refer back to cards for complete evaluation.  Will follow.

## 2013-06-01 NOTE — Assessment & Plan Note (Signed)
Chronic problem.  Tolerating statin w/o difficulty.  Check labs.  Adjust meds prn  

## 2013-06-01 NOTE — Patient Instructions (Signed)
Follow up in 3-4 months to recheck sugars We'll notify you of your lab results and make any changes if needed We'll call you with your cardiology appt Call with any questions or concerns Happy Belated Birthday!!!

## 2013-06-01 NOTE — Progress Notes (Signed)
Pre-visit discussion using our clinic review tool, as applicable. No additional management support is needed unless otherwise documented below in the visit note.  

## 2013-06-01 NOTE — Assessment & Plan Note (Signed)
Chronic problem, BP was initially elevated in office but returned to normal by end of OV.  No med changes at this time.  Will follow.

## 2013-06-01 NOTE — Progress Notes (Signed)
   Subjective:    Patient ID: Lacey [name], female    DOB: 1950/06/13, 63 y.o.   MRN: [id number]  HPI DM- chronic problem,on Lantus, Novolog, Metformin.  CBGs 70-140s.  Denies symptomatic lows.  UTD on eye exam.  Ongoing neuropathy- stable.  HTN- chronic problem.  On Diovan HCT and Toprol.  BP is mildly elevated today but pt had near accident on [address].  Pt having intermittent CP- has seen Cards previously.  Mild SOB w/ exertion.  No HAs, visual changes.  Hyperlipidemia- chronic problem, on fenofibrate and lipitor.  No abd pain, N/V, myalgias   Review of Systems For ROS see HPI     Objective:   Physical Exam  Vitals reviewed. Constitutional: She is oriented to person, place, and time. She appears well-developed and well-nourished. No distress.  HENT:  Head: Normocephalic and atraumatic.  Eyes: Conjunctivae and EOM are normal. Pupils are equal, round, and reactive to light.  Neck: Normal range of motion. Neck supple. No thyromegaly present.  Cardiovascular: Normal rate, regular rhythm, normal heart sounds and intact distal pulses.   No murmur heard. Pulmonary/Chest: Effort normal and breath sounds normal. No respiratory distress.  Abdominal: Soft. She exhibits no distension. There is no tenderness.  Musculoskeletal: She exhibits no edema.  Lymphadenopathy:    She has no cervical adenopathy.  Neurological: She is alert and oriented to person, place, and time.  Skin: Skin is warm and dry.  Psychiatric: She has a normal mood and affect. Her behavior is normal.          Assessment & Plan:

## 2013-06-02 ENCOUNTER — Telehealth: Payer: Self-pay

## 2013-06-02 ENCOUNTER — Telehealth: Payer: Self-pay | Admitting: Family Medicine

## 2013-06-02 NOTE — Telephone Encounter (Signed)
Relevant patient education assigned to patient using Emmi. ° °

## 2013-06-05 ENCOUNTER — Other Ambulatory Visit: Payer: Self-pay | Admitting: General Practice

## 2013-06-05 MED ORDER — CLONAZEPAM 1 MG PO TABS: 1.0000 mg | ORAL_TABLET | Freq: Every day | ORAL | Status: DC

## 2013-06-28 ENCOUNTER — Encounter: Payer: Self-pay | Admitting: Internal Medicine

## 2013-06-28 ENCOUNTER — Ambulatory Visit (INDEPENDENT_AMBULATORY_CARE_PROVIDER_SITE_OTHER): Payer: BC Managed Care – PPO | Admitting: Internal Medicine

## 2013-06-28 VITALS — BP 164/62 | HR 79 | Ht 62.0 in | Wt 233.0 lb

## 2013-06-28 DIAGNOSIS — R079 Chest pain, unspecified: Secondary | ICD-10-CM

## 2013-06-28 NOTE — Patient Instructions (Signed)
Please arrange visit with Jeremy/Sally (pharm) for lipid management.  Your physician recommends that you continue on your current medications as directed. Please refer to the Current Medication list given to you today.  Your physician wants you to follow-up in: 1 year with Dr. Caryl Comes.  You will receive a reminder letter in the mail two months in advance. If you don't receive a letter, please call our office to schedule the follow-up appointment.

## 2013-06-28 NOTE — Progress Notes (Signed)
Patient Care Team: Midge Minium, MD as PCP - General   HPI  Lacey [name] is a 63 y.o. female  She has a history of palpitations. They were thought upon evaluation to be caused by atrial tachycardia  She also has hypertension  She has a history of chest discomfort which prompts this re referral  the pain is described as over her left breast. The last 20-30 minutes at a time. It is unassociated with exertion and is   Nowithout radiation. There is no associated dyspnea or diaphoresis.  She has no [place] CAD per cath 3/06 .An echocardiogram 04/30/10: EF 55-60%, mild LVH, grade 1 diastolic dysfunction. She had a Myoview done in 04/30/10: EF 63%, no scar, no ischemia.  this was undertaken for the aforementioned chest pain syndrome  She is a greater than 20 year history of easy sweating and night sweats  TSH was normal 7 months ago  Past Medical History  Diagnosis Date  . Hyperlipidemia   . Hypertension   . Vitamin D deficiency   . Adenomatous colon polyp     hyperplastic  . Diverticulosis   . Diabetes mellitus, type 2     ORAL AND INSULIN MEDS (LAST A1C  7.3)  . Atrial tachycardia CARDIOLOGIST - DR Caryl Comes (LAST VISIT AUG 2012)    Echo 12/11: EF 55-60%, Mild LVH, grade 1 diast dysfxn;   holter 1/12: ATach  . Neuropathy, peripheral   . Erythema CURRENT-- CLOSED ABD. WALL ABSCESS    PER PCP NOTE (03-31-11)- MRSA-- TAKES DOXYCYCLINE  . Sepsis, urinary HISTORY - 2004  . Restless leg syndrome   . Insomnia     TAKES MEDS PRN  . Arthritis     NECK, KNEES, FINGERS, TOES  . GERD (gastroesophageal reflux disease)     CONTROLLED W/ PREVACID  . Atypical chest pain     a. 07/2004 Cath: Clean cors;  b. 04/2010 Myoview: EF 63%, no ischemia  . Anginal pain     not recent  . Pneumonia     as child  . History of kidney stones 2012  . Post op infection 11/07/12    left bunionectomy    Past Surgical History  Procedure Laterality Date  . Laparoscopic cholecystectomy  2001    . Carpal tunnel release  2000    RIGHT  . Cervical fusion  10-12-07    C5 - 7  . Right thumb joint surg.   MAY 2011  . Gastric bypass  1981  . Knee arthroscopy  AUG 2012    LEFT  . Cystoscopy/retrograde/ureteroscopy/stone extraction with basket  X2 2004 & 2009    LEFT   . Ureteral stent placement  X2  2004  &  2009    LEFT  . Percutaneous nephrostolithotomy  02-27-11    LEFT  . Trigger finger release  2010    RIGHT THUMB  . Ureteroscopy  04/06/2011    Procedure: URETEROSCOPY;  Surgeon: Claybon Jabs, MD;  Location: [facility];  Service: Urology;  Laterality: Left;  L Ureteroscopy Laser Litho & Stent   . Cardiac catheterization  07/10/04  . Abdominal hysterectomy  1995    ovaries remain  . Left thumb joint surgery  2013  . Bunionectomy Left 10/24/12  . Cystoscopy with retrograde pyelogram, ureteroscopy and stent placement Left 11/28/2012    Procedure: LEFT RETROGRADE PYELOGRAM, LEFT URETEROSCOPY, ;  Surgeon: Claybon Jabs, MD;  Location: [facility];  Service: Urology;  Laterality: Left;    Current Outpatient Prescriptions  Medication Sig Dispense Refill  . aspirin EC 81 MG tablet Take 81 mg by mouth daily.      Marland Kitchen atorvastatin (LIPITOR) 20 MG tablet Take 20 mg by mouth at bedtime.      . celecoxib (CELEBREX) 200 MG capsule Take 200 mg by mouth every morning.      . cholestyramine light (PREVALITE) 4 GM/DOSE powder Take 4 g by mouth 2 (two) times daily.      . clonazePAM (KLONOPIN) 1 MG tablet Take 1 tablet (1 mg total) by mouth at bedtime. 1 tab nightly for restless leg.  90 tablet  1  . fenofibrate 160 MG tablet TAKE 1 TABLET DAILY  90 tablet  2  . hydrocortisone valerate cream (WESTCORT) 0.2 % Apply 1 application topically 2 (two) times daily. sparingly  45 g  3  . Insulin Glargine (LANTUS SOLOSTAR) 100 UNIT/ML SOPN Inject 62 Units into the skin daily.      . lansoprazole (PREVACID) 15 MG capsule Take 15 mg by mouth daily.      Marland Kitchen loperamide (IMODIUM) 2 MG capsule  Take 2 mg by mouth 4 (four) times daily as needed for diarrhea or loose stools.      . metFORMIN (GLUCOPHAGE) 1000 MG tablet TAKE 1 TABLET TWICE A DAY WITH A MEAL  180 tablet  2  . metoprolol succinate (TOPROL-XL) 50 MG 24 hr tablet TAKE 2 TABLETS TWICE A DAY. TAKE WITH OR IMMEDIATELY FOLLOWING A MEAL  120 tablet  0  . NOVOLOG FLEXPEN 100 UNIT/ML SOPN FlexPen INJECT 24 UNITS UNDER THE SKIN THREE TIMES A DAY BEFORE MEALS PER SLIDING SCALE  3 pen  3  . ONE TOUCH ULTRA TEST test strip TEST FOUR TIMES A DAY AS DIRECTED  200 each  11  . traZODone (DESYREL) 50 MG tablet TAKE ONE-HALF (1/2) TO 1 TABLET (25 TO 50 MG TOTAL) AT BEDTIME AS NEEDED FOR SLEEP  90 tablet  2  . valsartan-hydrochlorothiazide (DIOVAN-HCT) 160-12.5 MG per tablet Take 1 tablet by mouth every morning.       No current facility-administered medications for this visit.    No Known Allergies  Review of Systems negative except from HPI and PMH  Physical Exam LMP 06/06/1993 BP 164/62  Pulse 79  Ht 5\' 2"  (1.575 m)  Wt 233 lb (105.688 kg)  BMI 42.61 kg/m2  LMP 06/06/1993  Well developed and nourished in no acute distress HENT normal Neck supple with JVP-flat Clear Regular rate and rhythm, no murmurs or gallops Abd-soft with active BS No Clubbing cyanosis edema Skin-warm and damp A & Oriented  Grossly normal sensory and motor function     Assessment and  Plan  Chest pain syndrome this is unlikely to be cardiac given its prolonged history, prior negative Myoview and an association with exertion. She is advised to let us know if her symptom complex changes. We will see her again in one year  Dyslipidemia we will have her see the [facility] lipid clinic  Atrial tachycardia  Symptomatically quiet

## 2013-07-03 ENCOUNTER — Other Ambulatory Visit: Payer: Self-pay | Admitting: Family Medicine

## 2013-07-03 ENCOUNTER — Other Ambulatory Visit: Payer: Self-pay | Admitting: Internal Medicine

## 2013-07-04 NOTE — Telephone Encounter (Signed)
Med filled.  

## 2013-07-14 ENCOUNTER — Ambulatory Visit: Payer: BC Managed Care – PPO | Admitting: Pharmacist

## 2013-07-20 ENCOUNTER — Ambulatory Visit (INDEPENDENT_AMBULATORY_CARE_PROVIDER_SITE_OTHER): Payer: BC Managed Care – PPO | Admitting: Family Medicine

## 2013-07-20 ENCOUNTER — Encounter: Payer: Self-pay | Admitting: Family Medicine

## 2013-07-20 VITALS — BP 110/68 | HR 84 | Temp 98.0°F | Resp 16 | Wt 225.1 lb

## 2013-07-20 DIAGNOSIS — K529 Noninfective gastroenteritis and colitis, unspecified: Secondary | ICD-10-CM

## 2013-07-20 DIAGNOSIS — K5289 Other specified noninfective gastroenteritis and colitis: Secondary | ICD-10-CM

## 2013-07-20 MED ORDER — PROMETHAZINE HCL 25 MG PO TABS: 25.0000 mg | ORAL_TABLET | Freq: Four times a day (QID) | ORAL | Status: DC | PRN

## 2013-07-20 MED ORDER — PROMETHAZINE HCL 25 MG/ML IJ SOLN
25.0000 mg | Freq: Once | INTRAMUSCULAR | Status: AC
  Administered 2013-07-20: 25 mg via INTRAMUSCULAR

## 2013-07-20 MED ORDER — DIPHENOXYLATE-ATROPINE 2.5-0.025 MG PO TABS: 1.0000 | ORAL_TABLET | Freq: Four times a day (QID) | ORAL | Status: DC | PRN

## 2013-07-20 NOTE — Assessment & Plan Note (Signed)
Pt's sxs and lack of fever consistent w/ viral gastroenteritis.  Start phenergan for nausea and lomotil for diarrhea as pt's sxs are not improving on immodium.  Stressed need for fluids.  Will follow.

## 2013-07-20 NOTE — Progress Notes (Signed)
   Subjective:    Patient ID: [name], female    DOB: Sep 02, 1950, 63 y.o.   MRN: [id number]  HPI Gastroenteritis- sxs started Monday AM w/ HA and had to leave work after lunch due to nausea.  Started vomiting when she got home.  Vomited x2.5 days and then diarrhea started.  Not taking meds due to vomiting and low sugar/BP due to not eating.  No relief w/ immodium.  + sick contacts.   Review of Systems For ROS see HPI     Objective:   Physical Exam  Vitals reviewed. Constitutional: She is oriented to person, place, and time. She appears well-developed and well-nourished. No distress.  HENT:  Head: Normocephalic and atraumatic.  MMM  Neck: Neck supple.  Cardiovascular: Normal rate, regular rhythm and intact distal pulses.   Pulmonary/Chest: Effort normal and breath sounds normal. No respiratory distress. She has no wheezes. She has no rales.  Abdominal: Soft. She exhibits no distension. There is no tenderness. There is no rebound.  Hyperactive BS  Lymphadenopathy:    She has no cervical adenopathy.  Neurological: She is alert and oriented to person, place, and time.  Skin: Skin is warm and dry.          Assessment & Plan:

## 2013-07-20 NOTE — Patient Instructions (Signed)
Follow up as needed Use the phenergan for the nausea DRINK! Eat as you are able, this will come as you feel better Use the lomotil as needed for the diarrhea Call with any questions or concerns Hang in there!!

## 2013-07-20 NOTE — Progress Notes (Signed)
Pre visit review using our clinic review tool, if applicable. No additional management support is needed unless otherwise documented below in the visit note. 

## 2013-07-21 ENCOUNTER — Telehealth: Payer: Self-pay | Admitting: Family Medicine

## 2013-07-21 NOTE — Telephone Encounter (Signed)
Relevant patient education assigned to patient using Emmi. ° °

## 2013-08-01 ENCOUNTER — Other Ambulatory Visit: Payer: Self-pay | Admitting: Family Medicine

## 2013-08-01 NOTE — Telephone Encounter (Signed)
Med filled.  

## 2013-08-19 ENCOUNTER — Other Ambulatory Visit: Payer: Self-pay | Admitting: Family Medicine

## 2013-08-20 ENCOUNTER — Other Ambulatory Visit: Payer: Self-pay | Admitting: Family Medicine

## 2013-08-21 NOTE — Telephone Encounter (Signed)
Med filled.  

## 2013-09-06 ENCOUNTER — Encounter: Payer: Self-pay | Admitting: Family Medicine

## 2013-09-06 ENCOUNTER — Ambulatory Visit (INDEPENDENT_AMBULATORY_CARE_PROVIDER_SITE_OTHER): Payer: BC Managed Care – PPO | Admitting: Family Medicine

## 2013-09-06 ENCOUNTER — Other Ambulatory Visit: Payer: Self-pay | Admitting: Internal Medicine

## 2013-09-06 VITALS — BP 130/80 | HR 70 | Temp 98.2°F | Resp 16 | Wt 225.1 lb

## 2013-09-06 DIAGNOSIS — E1142 Type 2 diabetes mellitus with diabetic polyneuropathy: Secondary | ICD-10-CM

## 2013-09-06 DIAGNOSIS — E1165 Type 2 diabetes mellitus with hyperglycemia: Principal | ICD-10-CM

## 2013-09-06 DIAGNOSIS — E1140 Type 2 diabetes mellitus with diabetic neuropathy, unspecified: Secondary | ICD-10-CM

## 2013-09-06 DIAGNOSIS — E1149 Type 2 diabetes mellitus with other diabetic neurological complication: Secondary | ICD-10-CM

## 2013-09-06 DIAGNOSIS — N390 Urinary tract infection, site not specified: Secondary | ICD-10-CM

## 2013-09-06 DIAGNOSIS — IMO0002 Reserved for concepts with insufficient information to code with codable children: Secondary | ICD-10-CM

## 2013-09-06 DIAGNOSIS — L989 Disorder of the skin and subcutaneous tissue, unspecified: Secondary | ICD-10-CM

## 2013-09-06 LAB — BASIC METABOLIC PANEL
BUN: 15 mg/dL (ref 6–23)
CHLORIDE: 106 meq/L (ref 96–112)
CO2: 25 mEq/L (ref 19–32)
Calcium: 9.1 mg/dL (ref 8.4–10.5)
Creatinine, Ser: 0.8 mg/dL (ref 0.4–1.2)
GFR: 78.04 mL/min (ref >60.00)
Glucose, Bld: 93 mg/dL (ref 70–99)
POTASSIUM: 3.8 meq/L (ref 3.5–5.1)
SODIUM: 140 meq/L (ref 135–145)

## 2013-09-06 LAB — HEMOGLOBIN A1C: HEMOGLOBIN A1C: 6.9 % — AB (ref 4.6–6.5)

## 2013-09-06 MED ORDER — CLOTRIMAZOLE-BETAMETHASONE 1-0.05 % EX CREA: 1.0000 "application " | TOPICAL_CREAM | Freq: Two times a day (BID) | CUTANEOUS | Status: DC

## 2013-09-06 MED ORDER — NITROFURANTOIN MONOHYD MACRO 100 MG PO CAPS: ORAL_CAPSULE | ORAL | Status: DC

## 2013-09-06 NOTE — Assessment & Plan Note (Signed)
Ongoing issue for pt.  Has seen urology.  Needs refill on Macrodantin.  Script sent to mail order pharmacy.

## 2013-09-06 NOTE — Assessment & Plan Note (Signed)
Chronic problem.  CBGs have been excellent recently.  UTD on eye exam.  Continued neuropathy- did not do foot exam today due to case on L foot.  Check labs.  Adjust meds prn

## 2013-09-06 NOTE — Assessment & Plan Note (Signed)
New.  Start Lotrisone cream twice daily.  If no improvement in the next few weeks will refer to derm.  Pt expressed understanding and is in agreement w/ plan.

## 2013-09-06 NOTE — Progress Notes (Signed)
   Subjective:    Patient ID: Lacey [name], female    DOB: May 08, 1950, 63 y.o.   MRN: [id number]  HPI DM- chronic problem, on Lantus, Novolog, Metformin.  On ARB for renal protection.   CBG this AM 101.  Sugars have been very well controlled.  No CP, SOB, HAs, visual changes, edema.  UTD on eye exam (due in July).  + numbness/tingling of feet (particularly L foot s/p injury and recent casting).  Recurrent UTIs- was seeing Dr Terance Hart, no Ottelin (uro) and kept supply of Macrobid in case of infxn.    Skin lesion- L temple, 1st appeared 2 months ago.  Initially thought it was ringworm and school nurse agreed.  Started applying OTC antifungal cream w/o improvement.  Intermittently itches.     Review of Systems For ROS see HPI     Objective:   Physical Exam  Vitals reviewed. Constitutional: She is oriented to person, place, and time. She appears well-developed and well-nourished. No distress.  HENT:  Head: Normocephalic and atraumatic.  Eyes: Conjunctivae and EOM are normal. Pupils are equal, round, and reactive to light.  Neck: Normal range of motion. Neck supple. No thyromegaly present.  Cardiovascular: Normal rate, regular rhythm, normal heart sounds and intact distal pulses.   No murmur heard. Pulmonary/Chest: Effort normal and breath sounds normal. No respiratory distress.  Abdominal: Soft. She exhibits no distension. There is no tenderness.  Musculoskeletal: She exhibits no edema.  Lymphadenopathy:    She has no cervical adenopathy.  Neurological: She is alert and oriented to person, place, and time.  Skin: Skin is warm and dry. There is erythema (ovoid lesion on L temple w/ mild scaling).  Psychiatric: She has a normal mood and affect. Her behavior is normal.          Assessment & Plan:

## 2013-09-06 NOTE — Progress Notes (Signed)
Pre visit review using our clinic review tool, if applicable. No additional management support is needed unless otherwise documented below in the visit note. 

## 2013-09-06 NOTE — Patient Instructions (Signed)
Schedule your complete physical in 3-4 months We'll notify you of your lab results and make any changes if needed Apply the Lotrisone cream twice daily- if no better in 2-3 weeks call me so we can send you to derm Use the Macrobid as needed for UTI Call with any questions or concerns Happy Mother's Day!

## 2013-09-12 ENCOUNTER — Other Ambulatory Visit: Payer: Self-pay | Admitting: Family Medicine

## 2013-09-12 NOTE — Telephone Encounter (Signed)
Med filled.  

## 2013-09-28 ENCOUNTER — Encounter: Payer: Self-pay | Admitting: Family Medicine

## 2013-10-12 ENCOUNTER — Other Ambulatory Visit: Payer: Self-pay | Admitting: Family Medicine

## 2013-10-12 NOTE — Telephone Encounter (Signed)
Med filled.  

## 2013-10-16 ENCOUNTER — Other Ambulatory Visit: Payer: Self-pay | Admitting: Family Medicine

## 2013-10-16 ENCOUNTER — Other Ambulatory Visit: Payer: Self-pay | Admitting: Internal Medicine

## 2013-10-16 NOTE — Telephone Encounter (Signed)
Med filled.  

## 2013-10-27 ENCOUNTER — Other Ambulatory Visit: Payer: Self-pay | Admitting: Family Medicine

## 2013-10-27 NOTE — Telephone Encounter (Signed)
Med filled.  

## 2013-11-14 ENCOUNTER — Other Ambulatory Visit: Payer: Self-pay | Admitting: Family Medicine

## 2013-11-15 NOTE — Telephone Encounter (Signed)
Med filled.  

## 2013-12-02 LAB — HM DIABETES EYE EXAM

## 2013-12-07 ENCOUNTER — Encounter: Payer: Self-pay | Admitting: Internal Medicine

## 2013-12-11 LAB — HM MAMMOGRAPHY

## 2013-12-18 ENCOUNTER — Encounter: Payer: Self-pay | Admitting: General Practice

## 2013-12-25 ENCOUNTER — Encounter: Payer: Self-pay | Admitting: Family Medicine

## 2013-12-25 MED ORDER — CLONAZEPAM 1 MG PO TABS: 1.0000 mg | ORAL_TABLET | Freq: Every day | ORAL | Status: DC

## 2013-12-25 NOTE — Telephone Encounter (Signed)
Med filled.  

## 2014-01-09 ENCOUNTER — Encounter: Payer: BC Managed Care – PPO | Admitting: Family Medicine

## 2014-01-15 ENCOUNTER — Encounter: Payer: Self-pay | Admitting: General Practice

## 2014-01-15 ENCOUNTER — Ambulatory Visit (INDEPENDENT_AMBULATORY_CARE_PROVIDER_SITE_OTHER): Payer: BC Managed Care – PPO | Admitting: Family Medicine

## 2014-01-15 ENCOUNTER — Encounter: Payer: Self-pay | Admitting: Family Medicine

## 2014-01-15 ENCOUNTER — Ambulatory Visit (HOSPITAL_BASED_OUTPATIENT_CLINIC_OR_DEPARTMENT_OTHER)
Admission: RE | Admit: 2014-01-15 | Discharge: 2014-01-15 | Disposition: A | Discharge status: Home / Self Care | Payer: BC Managed Care – PPO | Source: Ambulatory Visit | Attending: Family Medicine | Admitting: Family Medicine

## 2014-01-15 VITALS — BP 120/78 | HR 75 | Temp 97.9°F | Resp 16 | Ht 62.0 in | Wt 225.4 lb

## 2014-01-15 DIAGNOSIS — Z720 Tobacco use: Secondary | ICD-10-CM

## 2014-01-15 DIAGNOSIS — Z801 Family history of malignant neoplasm of trachea, bronchus and lung: Secondary | ICD-10-CM | POA: Diagnosis not present

## 2014-01-15 DIAGNOSIS — F17200 Nicotine dependence, unspecified, uncomplicated: Secondary | ICD-10-CM | POA: Insufficient documentation

## 2014-01-15 DIAGNOSIS — E1142 Type 2 diabetes mellitus with diabetic polyneuropathy: Secondary | ICD-10-CM

## 2014-01-15 DIAGNOSIS — E1140 Type 2 diabetes mellitus with diabetic neuropathy, unspecified: Secondary | ICD-10-CM

## 2014-01-15 DIAGNOSIS — E559 Vitamin D deficiency, unspecified: Secondary | ICD-10-CM

## 2014-01-15 DIAGNOSIS — Z0000 Encounter for general adult medical examination without abnormal findings: Secondary | ICD-10-CM

## 2014-01-15 DIAGNOSIS — E1149 Type 2 diabetes mellitus with other diabetic neurological complication: Secondary | ICD-10-CM

## 2014-01-15 DIAGNOSIS — IMO0002 Reserved for concepts with insufficient information to code with codable children: Secondary | ICD-10-CM

## 2014-01-15 DIAGNOSIS — E1165 Type 2 diabetes mellitus with hyperglycemia: Secondary | ICD-10-CM

## 2014-01-15 DIAGNOSIS — Z23 Encounter for immunization: Secondary | ICD-10-CM

## 2014-01-15 LAB — HEPATIC FUNCTION PANEL
ALBUMIN: 3.8 g/dL (ref 3.5–5.2)
ALK PHOS: 50 U/L (ref 39–117)
ALT: 24 U/L (ref 0–35)
AST: 36 U/L (ref 0–37)
BILIRUBIN DIRECT: 0 mg/dL (ref 0.0–0.3)
TOTAL PROTEIN: 6.9 g/dL (ref 6.0–8.3)
Total Bilirubin: 0.4 mg/dL (ref 0.2–1.2)

## 2014-01-15 LAB — BASIC METABOLIC PANEL
BUN: 14 mg/dL (ref 6–23)
CO2: 25 meq/L (ref 19–32)
Calcium: 8.9 mg/dL (ref 8.4–10.5)
Chloride: 105 mEq/L (ref 96–112)
Creatinine, Ser: 0.8 mg/dL (ref 0.4–1.2)
GFR: 72.62 mL/min (ref >60.00)
Glucose, Bld: 105 mg/dL — ABNORMAL HIGH (ref 70–99)
Potassium: 4.3 mEq/L (ref 3.5–5.1)
SODIUM: 139 meq/L (ref 135–145)

## 2014-01-15 LAB — TSH: TSH: 2.31 u[IU]/mL (ref 0.35–4.50)

## 2014-01-15 LAB — CBC WITH DIFFERENTIAL/PLATELET
BASOS ABS: 0.1 10*3/uL (ref 0.0–0.1)
Basophils Relative: 0.9 % (ref 0.0–3.0)
Eosinophils Absolute: 0.2 10*3/uL (ref 0.0–0.7)
Eosinophils Relative: 2.5 % (ref 0.0–5.0)
HCT: 37.3 % (ref 36.0–46.0)
Hemoglobin: 12.2 g/dL (ref 12.0–15.0)
LYMPHS PCT: 31.2 % (ref 12.0–46.0)
Lymphs Abs: 2.1 10*3/uL (ref 0.7–4.0)
MCHC: 32.7 g/dL (ref 30.0–36.0)
MCV: 89.6 fl (ref 78.0–100.0)
MONOS PCT: 6.5 % (ref 3.0–12.0)
Monocytes Absolute: 0.4 10*3/uL (ref 0.1–1.0)
Neutro Abs: 4 10*3/uL (ref 1.4–7.7)
Neutrophils Relative %: 58.9 % (ref 43.0–77.0)
Platelets: 173 10*3/uL (ref 150.0–400.0)
RBC: 4.17 Mil/uL (ref 3.87–5.11)
RDW: 14.9 % (ref 11.5–15.5)
WBC: 6.9 10*3/uL (ref 4.0–10.5)

## 2014-01-15 LAB — LIPID PANEL
Cholesterol: 98 mg/dL (ref 0–200)
HDL: 31.5 mg/dL — ABNORMAL LOW (ref >39.00)
LDL Cholesterol: 33 mg/dL (ref 0–99)
NonHDL: 66.5
TRIGLYCERIDES: 170 mg/dL — AB (ref 0.0–149.0)
Total CHOL/HDL Ratio: 3
VLDL: 34 mg/dL (ref 0.0–40.0)

## 2014-01-15 LAB — HEMOGLOBIN A1C: HEMOGLOBIN A1C: 6.7 % — AB (ref 4.6–6.5)

## 2014-01-15 LAB — VITAMIN D 25 HYDROXY (VIT D DEFICIENCY, FRACTURES): VITD: 28.16 ng/mL — AB (ref 30.00–100.00)

## 2014-01-15 NOTE — Patient Instructions (Signed)
Follow up in 3-4 months to recheck diabetes We'll notify you of your lab results and make any changes if needed We'll notify you of your chest xray results STOP SMOKING- you can do it! Call with any questions or concerns Hang in there!  You can do this!!

## 2014-01-15 NOTE — Assessment & Plan Note (Signed)
Pt's PE WNL w/ exception of obesity.  UTD on colonoscopy, Pneumovax, mammo.  No need for paps.  UTD on eye exam and podiatry.  Due to pt's hx of smoking and family hx of lung cancer, will get CXR.  Check labs.  Anticipatory guidance provided.

## 2014-01-15 NOTE — Assessment & Plan Note (Signed)
UTD on eye exam.  Check labs.  Check labs.  Adjust meds prn

## 2014-01-15 NOTE — Progress Notes (Signed)
   Subjective:    Patient ID: Lacey [name], female    DOB: 04/05/1951, 63 y.o.   MRN: [id number]  HPI CPE- UTD on mammo.  Due for DEXA- pt wants to hold due to multiple medical issues w/ husband.  UTD on colonoscopy.  No need for paps due to hysterectomy.  UTD on eye exam, December 11, 2013.  UTD on podiatry.   Review of Systems Patient reports no vision/ hearing changes, adenopathy,fever, weight change,  persistant/recurrent hoarseness , swallowing issues, chest pain, palpitations, edema, persistant/recurrent cough, hemoptysis, dyspnea (rest/exertional/paroxysmal nocturnal), gastrointestinal bleeding (melena, rectal bleeding), abdominal pain, significant heartburn, bowel changes, GU symptoms (dysuria, hematuria, incontinence), Gyn symptoms (abnormal  bleeding, pain),  syncope, focal weakness, memory loss, numbness & tingling, skin/hair/nail changes, abnormal bruising or bleeding, anxiety, or depression.     Objective:   Physical Exam General Appearance:    Alert, cooperative, no distress, appears stated age, obesity  Head:    Normocephalic, without obvious abnormality, atraumatic  Eyes:    PERRL, conjunctiva/corneas clear, EOM's intact, fundi    benign, both eyes  Ears:    Normal TM's and external ear canals, both ears  Nose:   Nares normal, septum midline, mucosa normal, no drainage    or sinus tenderness  Throat:   Lips, mucosa, and tongue normal; teeth and gums normal  Neck:   Supple, symmetrical, trachea midline, no adenopathy;    Thyroid: no enlargement/tenderness/nodules  Back:     Symmetric, no curvature, ROM normal, no CVA tenderness  Lungs:     Clear to auscultation bilaterally, respirations unlabored  Chest Wall:    No tenderness or deformity   Heart:    Regular rate and rhythm, S1 and S2 normal, no murmur, rub   or gallop  Breast Exam:    Deferred to mammo  Abdomen:     Soft, non-tender, bowel sounds active all four quadrants,    no masses, no organomegaly  Genitalia:     Deferred  Rectal:    Extremities:   Extremities normal, atraumatic, no cyanosis or edema  Pulses:   2+ and symmetric all extremities  Skin:   Skin color, texture, turgor normal, no rashes or lesions  Lymph nodes:   Cervical, supraclavicular, and axillary nodes normal  Neurologic:   CNII-XII intact, normal strength, sensation and reflexes    throughout          Assessment & Plan:

## 2014-01-15 NOTE — Progress Notes (Signed)
Pre visit review using our clinic review tool, if applicable. No additional management support is needed unless otherwise documented below in the visit note. 

## 2014-01-15 NOTE — Assessment & Plan Note (Signed)
Chronic problem, pt is attempting to quit smoking due to sister's recent dx of lung cancer.  Get CXR.  Will follow.

## 2014-01-15 NOTE — Assessment & Plan Note (Signed)
Check Vit D level.  Replete prn. °

## 2014-01-16 ENCOUNTER — Other Ambulatory Visit: Payer: Self-pay | Admitting: General Practice

## 2014-01-16 MED ORDER — VITAMIN D (ERGOCALCIFEROL) 1.25 MG (50000 UNIT) PO CAPS: 50000.0000 [IU] | ORAL_CAPSULE | ORAL | Status: DC

## 2014-01-22 ENCOUNTER — Other Ambulatory Visit: Payer: Self-pay | Admitting: Family Medicine

## 2014-01-22 NOTE — Telephone Encounter (Signed)
Med filled.  

## 2014-01-25 ENCOUNTER — Other Ambulatory Visit: Payer: Self-pay | Admitting: Family Medicine

## 2014-01-25 NOTE — Telephone Encounter (Signed)
Med filled.  

## 2014-03-05 ENCOUNTER — Other Ambulatory Visit: Payer: Self-pay | Admitting: Family Medicine

## 2014-03-05 NOTE — Telephone Encounter (Signed)
Med filled.  

## 2014-03-08 ENCOUNTER — Encounter: Payer: Self-pay | Admitting: Family Medicine

## 2014-03-09 ENCOUNTER — Other Ambulatory Visit: Payer: Self-pay | Admitting: Family Medicine

## 2014-03-09 NOTE — Telephone Encounter (Signed)
Med filled.  

## 2014-03-16 ENCOUNTER — Encounter: Payer: Self-pay | Admitting: Family Medicine

## 2014-04-02 ENCOUNTER — Other Ambulatory Visit: Payer: Self-pay | Admitting: Family Medicine

## 2014-04-02 NOTE — Telephone Encounter (Signed)
Med filled.  

## 2014-04-03 ENCOUNTER — Other Ambulatory Visit: Payer: Self-pay | Admitting: Family Medicine

## 2014-04-03 NOTE — Telephone Encounter (Signed)
Med filled.  

## 2014-04-10 ENCOUNTER — Telehealth: Payer: Self-pay | Admitting: Family Medicine

## 2014-04-10 NOTE — Telephone Encounter (Signed)
Pt notified this medication will not be filled. Pt should be on 3,000iu daily. We will recheck vitamin d levels at a future appt to see if this is needed.

## 2014-04-10 NOTE — Telephone Encounter (Signed)
Caller name: [facility]  Call back number:[phone]   Reason for call:  Pharmacy called requesting a refill of  Vitamin D, Ergocalciferol, (DRISDOL) 50000 UNITS CAPS capsule

## 2014-04-12 ENCOUNTER — Encounter: Payer: Self-pay | Admitting: Family Medicine

## 2014-04-12 ENCOUNTER — Other Ambulatory Visit: Payer: Self-pay | Admitting: Family Medicine

## 2014-04-12 ENCOUNTER — Ambulatory Visit (HOSPITAL_BASED_OUTPATIENT_CLINIC_OR_DEPARTMENT_OTHER)
Admission: RE | Admit: 2014-04-12 | Discharge: 2014-04-12 | Disposition: A | Discharge status: Home / Self Care | Payer: BC Managed Care – PPO | Source: Ambulatory Visit | Attending: Family Medicine | Admitting: Family Medicine

## 2014-04-12 ENCOUNTER — Ambulatory Visit (INDEPENDENT_AMBULATORY_CARE_PROVIDER_SITE_OTHER): Payer: BC Managed Care – PPO | Admitting: Family Medicine

## 2014-04-12 VITALS — BP 122/84 | HR 72 | Temp 98.1°F | Resp 16 | Wt 232.0 lb

## 2014-04-12 DIAGNOSIS — M5032 Other cervical disc degeneration, mid-cervical region: Secondary | ICD-10-CM | POA: Diagnosis not present

## 2014-04-12 DIAGNOSIS — S0990XA Unspecified injury of head, initial encounter: Secondary | ICD-10-CM

## 2014-04-12 DIAGNOSIS — R51 Headache: Secondary | ICD-10-CM | POA: Diagnosis present

## 2014-04-12 DIAGNOSIS — S065X9A Traumatic subdural hemorrhage with loss of consciousness of unspecified duration, initial encounter: Secondary | ICD-10-CM

## 2014-04-12 DIAGNOSIS — S065XAA Traumatic subdural hemorrhage with loss of consciousness status unknown, initial encounter: Secondary | ICD-10-CM

## 2014-04-12 DIAGNOSIS — W009XXA Unspecified fall due to ice and snow, initial encounter: Secondary | ICD-10-CM

## 2014-04-12 MED ORDER — CYCLOBENZAPRINE HCL 10 MG PO TABS: 10.0000 mg | ORAL_TABLET | Freq: Three times a day (TID) | ORAL | Status: DC | PRN

## 2014-04-12 MED ORDER — MECLIZINE HCL 50 MG PO TABS: 50.0000 mg | ORAL_TABLET | Freq: Three times a day (TID) | ORAL | Status: DC | PRN

## 2014-04-12 NOTE — Assessment & Plan Note (Signed)
New.  Pt fell hitting her back and head 2 days ago.  Last night, had vertigo and vomiting.  This has resolved.  Neuro exam today WNL but due to episode last night and use of ASA, will get CT to r/o bleed.  Start meclizine for dizziness prn.  Flexeril for trap spasm.

## 2014-04-12 NOTE — Patient Instructions (Addendum)
We'll notify you of your CT and xray results Use the flexeril as needed for neck spasm Use heating pad for neck pain Take the Meclizine as needed for dizziness Drink plenty of fluids REST! Call with any questions or concerns Hang in there! Happy Holidays!!!

## 2014-04-12 NOTE — Progress Notes (Signed)
   Subjective:    Patient ID: Lacey [name], female    DOB: 28-Jun-1950, 63 y.o.   MRN: [id number]  HPI Fall- occurred on Tuesday morning after slipping on ice.  Fell landing on back and head.  Got back in the house and noticed that she was having trouble focusing.  Slept through rest of the AM.  Did not lose consciousness.  Last night was vomiting when lying down due to dizziness.  No dizziness this AM.  Having severe neck pain- particularly w/ extension.  No nausea currently.  Currently on ASA and Celebrex.   Review of Systems For ROS see HPI     Objective:   Physical Exam  Constitutional: She is oriented to person, place, and time. She appears well-developed and well-nourished. No distress.  HENT:  Head: Normocephalic and atraumatic.  TMs WNL-  No blood present  Eyes: Conjunctivae are normal. Pupils are equal, round, and reactive to light.  Neck: Normal range of motion. Neck supple.  + trap spasm  Cardiovascular: Normal rate, regular rhythm, normal heart sounds and intact distal pulses.   Pulmonary/Chest: Effort normal and breath sounds normal. No respiratory distress. She has no wheezes. She has no rales.  Lymphadenopathy:    She has no cervical adenopathy.  Neurological: She is alert and oriented to person, place, and time. She has normal reflexes. No cranial nerve deficit. Coordination normal.  Skin: Skin is warm and dry.  Psychiatric: She has a normal mood and affect. Her behavior is normal. Thought content normal.  Vitals reviewed.         Assessment & Plan:

## 2014-04-12 NOTE — Assessment & Plan Note (Signed)
New.  Pt fell, hitting back of head 2 days ago.  Last night had dizziness and vomiting when attempting to lie down in episode consistent w/ BPV.  No neurologic deficits today.  But due to age, use of ASA, and episode of dizziness, will get CT to r/o bleed.  Reviewed supportive care and red flags that should prompt return.  Pt expressed understanding and is in agreement w/ plan.

## 2014-04-12 NOTE — Progress Notes (Signed)
Pre visit review using our clinic review tool, if applicable. No additional management support is needed unless otherwise documented below in the visit note. 

## 2014-04-17 ENCOUNTER — Ambulatory Visit (INDEPENDENT_AMBULATORY_CARE_PROVIDER_SITE_OTHER): Payer: BC Managed Care – PPO | Admitting: Family Medicine

## 2014-04-17 ENCOUNTER — Encounter: Payer: Self-pay | Admitting: Family Medicine

## 2014-04-17 VITALS — BP 126/82 | HR 76 | Temp 98.3°F | Resp 16 | Wt 229.2 lb

## 2014-04-17 DIAGNOSIS — IMO0002 Reserved for concepts with insufficient information to code with codable children: Secondary | ICD-10-CM

## 2014-04-17 DIAGNOSIS — E1165 Type 2 diabetes mellitus with hyperglycemia: Secondary | ICD-10-CM

## 2014-04-17 DIAGNOSIS — E1140 Type 2 diabetes mellitus with diabetic neuropathy, unspecified: Secondary | ICD-10-CM

## 2014-04-17 DIAGNOSIS — Z23 Encounter for immunization: Secondary | ICD-10-CM

## 2014-04-17 LAB — BASIC METABOLIC PANEL
BUN: 16 mg/dL (ref 6–23)
CHLORIDE: 105 meq/L (ref 96–112)
CO2: 23 meq/L (ref 19–32)
CREATININE: 0.7 mg/dL (ref 0.4–1.2)
Calcium: 9 mg/dL (ref 8.4–10.5)
GFR: 84 mL/min (ref >60.00)
Glucose, Bld: 121 mg/dL — ABNORMAL HIGH (ref 70–99)
Potassium: 4.4 mEq/L (ref 3.5–5.1)
Sodium: 135 mEq/L (ref 135–145)

## 2014-04-17 LAB — HEMOGLOBIN A1C: Hgb A1c MFr Bld: 7.1 % — ABNORMAL HIGH (ref 4.6–6.5)

## 2014-04-17 NOTE — Progress Notes (Signed)
   Subjective:    Patient ID: Lacey [name], female    DOB: [birth year], 63 y.o.   MRN: [id number]  HPI DM- chronic problem, UTD on eye exam.  On ARB for renal protection.  On Metformin, Novolog TID (25 units), and Lantus nightly (63 units).  Having rare symptomatic lows.  CBG yesterday was 104.  + neuropathy- slightly worse than previous.  No sores or open wounds.  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 She appears well-developed and well-nourished. No distress.  HENT:  Head: Normocephalic and atraumatic.  Eyes: Conjunctivae and EOM are normal. Pupils are equal, round, and reactive to light.  Neck: Normal range of motion. Neck supple. No thyromegaly present.  Cardiovascular: Normal rate, regular rhythm, normal heart sounds and intact distal pulses.   No murmur heard. Pulmonary/Chest: Effort normal and breath sounds normal. No respiratory distress.  Abdominal: Soft. She exhibits no distension. There is no tenderness.  Musculoskeletal: She exhibits no edema.  Lymphadenopathy:    She has no cervical adenopathy.  Neurological: She is alert and oriented to person, place, and time.  Skin: Skin is warm and dry.  Psychiatric: She has a normal mood and affect. Her behavior is normal.  Vitals reviewed.         Assessment & Plan:

## 2014-04-17 NOTE — Assessment & Plan Note (Signed)
Chronic problem.  Stable for pt.  UTD on eye exam.  Following w/ podiatry regularly for foot exams.  On ARB for renal protection.  Check labs.  Adjust meds prn.

## 2014-04-17 NOTE — Patient Instructions (Signed)
Follow up in 3-4 months to recheck diabetes and cholesterol We'll notify you of your lab results and make any changes if needed Keep up the good work on healthy diet and attempt to get regular exercise Call with any questions or concerns Happy Holidays!!!

## 2014-04-17 NOTE — Addendum Note (Signed)
Addended by: Kris Hartmann on: 04/17/2014 09:18 AM   Modules accepted: Orders

## 2014-04-17 NOTE — Progress Notes (Signed)
Pre visit review using our clinic review tool, if applicable. No additional management support is needed unless otherwise documented below in the visit note. 

## 2014-04-18 ENCOUNTER — Encounter: Payer: Self-pay | Admitting: Family Medicine

## 2014-04-24 ENCOUNTER — Other Ambulatory Visit: Payer: Self-pay | Admitting: Family Medicine

## 2014-04-25 ENCOUNTER — Other Ambulatory Visit: Payer: Self-pay | Admitting: Family Medicine

## 2014-04-25 NOTE — Telephone Encounter (Signed)
Med filled.  

## 2014-05-09 ENCOUNTER — Ambulatory Visit: Payer: BC Managed Care – PPO | Admitting: Family Medicine

## 2014-06-05 ENCOUNTER — Other Ambulatory Visit: Payer: Self-pay | Admitting: Family Medicine

## 2014-06-06 NOTE — Telephone Encounter (Signed)
Med filled.  

## 2014-06-16 ENCOUNTER — Other Ambulatory Visit: Payer: Self-pay | Admitting: Family Medicine

## 2014-06-18 NOTE — Telephone Encounter (Signed)
Med filled.  

## 2014-07-20 ENCOUNTER — Ambulatory Visit (INDEPENDENT_AMBULATORY_CARE_PROVIDER_SITE_OTHER): Payer: BC Managed Care – PPO | Admitting: Family Medicine

## 2014-07-20 ENCOUNTER — Encounter: Payer: Self-pay | Admitting: Family Medicine

## 2014-07-20 ENCOUNTER — Ambulatory Visit (HOSPITAL_BASED_OUTPATIENT_CLINIC_OR_DEPARTMENT_OTHER)
Admission: RE | Admit: 2014-07-20 | Discharge: 2014-07-20 | Disposition: A | Discharge status: Home / Self Care | Payer: BC Managed Care – PPO | Source: Ambulatory Visit | Attending: Family Medicine | Admitting: Family Medicine

## 2014-07-20 ENCOUNTER — Other Ambulatory Visit: Payer: Self-pay | Admitting: Family Medicine

## 2014-07-20 VITALS — BP 140/80 | HR 77 | Temp 97.9°F | Resp 16 | Wt 228.0 lb

## 2014-07-20 DIAGNOSIS — M25561 Pain in right knee: Secondary | ICD-10-CM

## 2014-07-20 DIAGNOSIS — M179 Osteoarthritis of knee, unspecified: Secondary | ICD-10-CM | POA: Diagnosis not present

## 2014-07-20 NOTE — Progress Notes (Signed)
Pre visit review using our clinic review tool, if applicable. No additional management support is needed unless otherwise documented below in the visit note. 

## 2014-07-20 NOTE — Patient Instructions (Signed)
Follow up as scheduled We'll notify you of your ultrasound and Xray results Continue the Celebrex and Hydrocodone for pain and inflammation Alternate heat/ice for pain Call with any questions or concerns Hang in there! Happy [place]!

## 2014-07-20 NOTE — Progress Notes (Signed)
   Subjective:    Patient ID: Lacey [name], female    DOB: 1950/08/07, 64 y.o.   MRN: [id number]  HPI R leg pain- sxs started 2-3 weeks ago but pain is worsening and now waking her at night.  'i don't know if it's the knee, behind the knee or the whole freaking leg'.  Really painful to drive.  Having some foot and ankle pain.  Behind the knee is exceptionally painful.  Unable to kneel on leg when getting in bed.  No swelling.  A few days ago leg felt 'numb and heavy'.  No known injury.  Has hydrocodone and Celebrex.  Has sensation of leg giving way.   Review of Systems For ROS see HPI     Objective:   Physical Exam  Constitutional: She is oriented to person, place, and time. She appears well-developed and well-nourished. No distress.  HENT:  Head: Normocephalic and atraumatic.  Cardiovascular: Intact distal pulses.   Musculoskeletal: She exhibits edema (pt w/ localized swelling posterior to R knee) and tenderness (TTP over R posterior knee).  Neurological: She is alert and oriented to person, place, and time. She has normal reflexes. No cranial nerve deficit.  + antalgic gait  Skin: Skin is warm and dry. No erythema.  Psychiatric: She has a normal mood and affect. Her behavior is normal.  Vitals reviewed.         Assessment & Plan:

## 2014-07-22 ENCOUNTER — Other Ambulatory Visit: Payer: Self-pay | Admitting: Family Medicine

## 2014-07-22 NOTE — Assessment & Plan Note (Addendum)
New.  Pain is severe and waking her from sleep.  PE is consistent w/ possible Baker's cyst.  Will get xrays and [place] to assess.  Pt already on scheduled NSAIDs and has pain meds available.  Pt to take meds and if no improvement will need ortho referral.  Pt expressed understanding and is in agreement w/ plan.

## 2014-07-23 ENCOUNTER — Other Ambulatory Visit: Payer: Self-pay | Admitting: Family Medicine

## 2014-07-23 ENCOUNTER — Encounter: Payer: Self-pay | Admitting: Family Medicine

## 2014-07-23 DIAGNOSIS — M25561 Pain in right knee: Secondary | ICD-10-CM

## 2014-07-23 MED ORDER — CLONAZEPAM 1 MG PO TABS: 1.0000 mg | ORAL_TABLET | Freq: Every day | ORAL | Status: DC

## 2014-07-23 NOTE — Telephone Encounter (Signed)
Med filled.  

## 2014-07-23 NOTE — Telephone Encounter (Signed)
Last OV 07-20-14 Clonazepam last filled 12-25-13 #30 with 0

## 2014-07-23 NOTE — Telephone Encounter (Signed)
Med filled and faxed.  

## 2014-07-23 NOTE — Telephone Encounter (Signed)
Referral placed.

## 2014-07-25 ENCOUNTER — Encounter: Payer: Self-pay | Admitting: Internal Medicine

## 2014-07-25 ENCOUNTER — Ambulatory Visit (INDEPENDENT_AMBULATORY_CARE_PROVIDER_SITE_OTHER): Payer: BC Managed Care – PPO | Admitting: Internal Medicine

## 2014-07-25 VITALS — BP 134/82 | HR 71 | Ht 62.0 in | Wt 228.0 lb

## 2014-07-25 DIAGNOSIS — R002 Palpitations: Secondary | ICD-10-CM

## 2014-07-25 DIAGNOSIS — I110 Hypertensive heart disease with heart failure: Secondary | ICD-10-CM | POA: Diagnosis not present

## 2014-07-25 DIAGNOSIS — I509 Heart failure, unspecified: Secondary | ICD-10-CM

## 2014-07-25 DIAGNOSIS — G478 Other sleep disorders: Secondary | ICD-10-CM

## 2014-07-25 DIAGNOSIS — G4730 Sleep apnea, unspecified: Secondary | ICD-10-CM

## 2014-07-25 MED ORDER — VALSARTAN 160 MG PO TABS: 160.0000 mg | ORAL_TABLET | Freq: Every day | ORAL | Status: DC

## 2014-07-25 MED ORDER — FUROSEMIDE 20 MG PO TABS: 20.0000 mg | ORAL_TABLET | Freq: Every day | ORAL | Status: DC

## 2014-07-25 NOTE — Progress Notes (Signed)
Patient Care Team: Midge Minium, MD as PCP - General   HPI  Lacey [name] is a 64 y.o. female  Seen with a  history of palpitations. They were thought upon evaluation to be caused by atrial tachycardia  She also has hypertension  She has had increasing problems with dyspnea on exertion. She has noted more peripheral edema. Dyspnea on exertion is associated with chest tightness with radiation. This is not occurring at rest as is noted last visit. She is short of breath at less than 150 feet.  She continues to smoke albeit infrequently.  She has significant daytime fatigue. She falls asleep as soon as she gets home from work. She has significant snoring.  She has no [place] CAD per cath 3/06 .An echocardiogram 04/30/10: EF 55-60%, mild LVH, grade 1 diastolic dysfunction. She had a Myoview done in 04/30/10: EF 63%, no scar, no ischemia.  this was undertaken for the aforementioned chest pain syndrome     TSH was normal 7 months ago  Past Medical History  Diagnosis Date  . Hyperlipidemia   . Hypertension   . Vitamin D deficiency   . Adenomatous colon polyp     hyperplastic  . Diverticulosis   . Diabetes mellitus, type 2     ORAL AND INSULIN MEDS (LAST A1C  7.3)  . Atrial tachycardia CARDIOLOGIST - DR Caryl Comes (LAST VISIT AUG 2012)    Echo 12/11: EF 55-60%, Mild LVH, grade 1 diast dysfxn;   holter 1/12: ATach  . Neuropathy, peripheral   . Erythema CURRENT-- CLOSED ABD. WALL ABSCESS    PER PCP NOTE (03-31-11)- MRSA-- TAKES DOXYCYCLINE  . Sepsis, urinary HISTORY - 2004  . Restless leg syndrome   . Insomnia     TAKES MEDS PRN  . Arthritis     NECK, KNEES, FINGERS, TOES  . GERD (gastroesophageal reflux disease)     CONTROLLED W/ PREVACID  . Atypical chest pain     a. 07/2004 Cath: Clean cors;  b. 04/2010 Myoview: EF 63%, no ischemia  . Anginal pain     not recent  . Pneumonia     as child  . History of kidney stones 2012  . Post op infection 11/07/12    left  bunionectomy    Past Surgical History  Procedure Laterality Date  . Laparoscopic cholecystectomy  2001  . Carpal tunnel release  2000    RIGHT  . Cervical fusion  10-12-07    C5 - 7  . Right thumb joint surg.   MAY 2011  . Gastric bypass  1981  . Knee arthroscopy  AUG 2012    LEFT  . Cystoscopy/retrograde/ureteroscopy/stone extraction with basket  X2 2004 & 2009    LEFT   . Ureteral stent placement  X2  2004  &  2009    LEFT  . Percutaneous nephrostolithotomy  02-27-11    LEFT  . Trigger finger release  2010    RIGHT THUMB  . Ureteroscopy  04/06/2011    Procedure: URETEROSCOPY;  Surgeon: Claybon Jabs, MD;  Location: [facility];  Service: Urology;  Laterality: Left;  L Ureteroscopy Laser Litho & Stent   . Cardiac catheterization  07/10/04  . Abdominal hysterectomy  1995    ovaries remain  . Left thumb joint surgery  2013  . Bunionectomy Left 10/24/12  . Cystoscopy with retrograde pyelogram, ureteroscopy and stent placement Left 11/28/2012    Procedure: LEFT RETROGRADE PYELOGRAM, LEFT URETEROSCOPY, ;  Surgeon: Claybon Jabs, MD;  Location: [facility];  Service: Urology;  Laterality: Left;    Current Outpatient Prescriptions  Medication Sig Dispense Refill  . aspirin EC 81 MG tablet Take 81 mg by mouth daily.    Marland Kitchen atorvastatin (LIPITOR) 20 MG tablet TAKE 1 TABLET AT BEDTIME 90 tablet 1  . B-D ULTRAFINE III SHORT PEN 31G X 8 MM MISC USE FOUR TIMES A DAY 1 each 4  . celecoxib (CELEBREX) 200 MG capsule Take 200 mg by mouth every morning.    . clonazePAM (KLONOPIN) 1 MG tablet Take 1 tablet (1 mg total) by mouth at bedtime. 1 tab nightly for restless leg. 30 tablet 0  . clotrimazole-betamethasone (LOTRISONE) cream Apply 1 application topically 2 (two) times daily. 45 g 0  . cyclobenzaprine (FLEXERIL) 10 MG tablet Take 1 tablet (10 mg total) by mouth 3 (three) times daily as needed for muscle spasms. 30 tablet 0  . diphenoxylate-atropine (LOMOTIL) 2.5-0.025 MG per tablet  Take 1 tablet by mouth 4 (four) times daily as needed for diarrhea or loose stools. 30 tablet 0  . fenofibrate 160 MG tablet TAKE 1 TABLET DAILY 90 tablet 1  . HYDROcodone-acetaminophen (NORCO/VICODIN) 5-325 MG per tablet Take 1 tablet by mouth 2 (two) times daily as needed.  0  . hydrocortisone valerate cream (WESTCORT) 0.2 % APPLY SPARINGLY TWICE A DAY 1 g 1  . lansoprazole (PREVACID) 15 MG capsule Take 15 mg by mouth daily.    Marland Kitchen LANTUS SOLOSTAR 100 UNIT/ML Solostar Pen INJECT 62 UNITS DAILY 4 pen 2  . loperamide (IMODIUM) 2 MG capsule Take 2 mg by mouth 4 (four) times daily as needed for diarrhea or loose stools.    . meclizine (ANTIVERT) 25 MG tablet     . metFORMIN (GLUCOPHAGE) 1000 MG tablet TAKE 1 TABLET TWICE A DAY WITH MEALS 180 tablet 1  . metoprolol succinate (TOPROL-XL) 50 MG 24 hr tablet TAKE AS INSTRUCTED BY YOUR PRESCRIBER 180 tablet 30  . nitrofurantoin, macrocrystal-monohydrate, (MACROBID) 100 MG capsule TAKE 1 CAPSULE TWICE A DAY FOR 7 DAYS IN CASE OF URINARY TRACT INFECTION REPEAT AS NEEDED. 56 capsule 2  . NOVOLOG FLEXPEN 100 UNIT/ML FlexPen INJECT 24 UNITS UNDER THE SKIN THREE TIMES A DAY BEFORE MEALS PER SLIDING SCALE 3 pen 6  . ONE TOUCH ULTRA TEST test strip TEST FOUR TIMES A DAY AS DIRECTED 200 each 11  . ONETOUCH DELICA LANCETS 99M MISC USE FOUR TIMES A DAY 100 each 5  . PREVALITE 4 G packet MIX AND DRINK 1 PACKET TWICE A DAY 180 packet 2  . promethazine (PHENERGAN) 25 MG tablet Take 1 tablet (25 mg total) by mouth every 6 (six) hours as needed for nausea or vomiting. 30 tablet 0  . traZODone (DESYREL) 50 MG tablet TAKE ONE-HALF (1/2) TO ONE TABLET AT BEDTIME AS NEEDED FOR SLEEP 90 tablet 1  . Vitamin D, Ergocalciferol, (DRISDOL) 50000 UNITS CAPS capsule Take 1 capsule (50,000 Units total) by mouth every 7 (seven) days. 12 capsule 0  . furosemide (LASIX) 20 MG tablet Take 1 tablet (20 mg total) by mouth daily. 30 tablet 3  . valsartan (DIOVAN) 160 MG tablet Take 1 tablet  (160 mg total) by mouth daily. 30 tablet 3   No current facility-administered medications for this visit.    No Known Allergies  Review of Systems negative except from HPI and PMH  Physical Exam BP 134/82 mmHg  Pulse 71  Ht 5\' 2"  (1.575 m)  Wt 228 lb (103.42 kg)  BMI 41.69 kg/m2  LMP 06/06/1993 BP 134/82 mmHg  Pulse 71  Ht 5\' 2"  (1.575 m)  Wt 228 lb (103.42 kg)  BMI 41.69 kg/m2  LMP 06/06/1993  Well developed and nourished in no acute distress HENT normal Neck supple with JVP-8-9  Clear Regular rate and rhythm, no murmurs or gallops Abd-soft with active BS No Clubbing cyanosis 1+edema Skin-warm and damp A & Oriented  Grossly normal sensory and motor function     Assessment and  Plan      Atrial tachycardia  Symptomatically quiet  Sleep disordered breathing  HFpEF  Obesity  Exertional chest discomfort  Cigarette abuse  Hypertension  With her worsening dyspnea on exertion and accompanied chest discomfort, it has been 4 years since we last assessed in this diabetic woman, will undertake repeat Myoview scanning.  We will change her diuretics discontinued her hydrochlorothiazide and begin her on low-dose furosemide at 20 mg a day. She will need a metabolic profile in about 2 weeks time.  She is encouraged to stop smoking  We will also undertake a sleep study given her fatigue obesity his sleep disordered breathing.

## 2014-07-25 NOTE — Patient Instructions (Addendum)
Your physician has recommended you make the following change in your medication:  1) STOP Diovan (Valsartan-Hydrochlorthiazide combo) 2) START Valsartan 160 mg daily 3) START Furosemide 20 mg daily  Your physician has requested that you have a lexiscan myoview. For further information please visit [email or web address]. Please follow instruction sheet, as given.  Your physician has recommended that you have a sleep study. This test records several body functions during sleep, including: brain activity, eye movement, oxygen and carbon dioxide blood levels, heart rate and rhythm, breathing rate and rhythm, the flow of air through your mouth and nose, snoring, body muscle movements, and chest and belly movement.  Your physician wants you to follow-up in: 1 year with Dr. Caryl Comes.  You will receive a reminder letter in the mail two months in advance. If you don't receive a letter, please call our office to schedule the follow-up appointment.

## 2014-07-31 ENCOUNTER — Ambulatory Visit: Payer: BC Managed Care – PPO | Admitting: Family Medicine

## 2014-08-03 ENCOUNTER — Other Ambulatory Visit: Payer: Self-pay | Admitting: General Practice

## 2014-08-03 ENCOUNTER — Encounter: Payer: Self-pay | Admitting: Family Medicine

## 2014-08-03 ENCOUNTER — Ambulatory Visit (INDEPENDENT_AMBULATORY_CARE_PROVIDER_SITE_OTHER): Payer: BC Managed Care – PPO | Admitting: Family Medicine

## 2014-08-03 VITALS — BP 130/80 | HR 69 | Temp 98.0°F | Resp 16 | Wt 227.4 lb

## 2014-08-03 DIAGNOSIS — E1169 Type 2 diabetes mellitus with other specified complication: Secondary | ICD-10-CM

## 2014-08-03 DIAGNOSIS — E785 Hyperlipidemia, unspecified: Secondary | ICD-10-CM | POA: Diagnosis not present

## 2014-08-03 DIAGNOSIS — I509 Heart failure, unspecified: Secondary | ICD-10-CM | POA: Diagnosis not present

## 2014-08-03 DIAGNOSIS — E1165 Type 2 diabetes mellitus with hyperglycemia: Secondary | ICD-10-CM | POA: Diagnosis not present

## 2014-08-03 DIAGNOSIS — I110 Hypertensive heart disease with heart failure: Secondary | ICD-10-CM | POA: Diagnosis not present

## 2014-08-03 DIAGNOSIS — E1140 Type 2 diabetes mellitus with diabetic neuropathy, unspecified: Secondary | ICD-10-CM

## 2014-08-03 DIAGNOSIS — N951 Menopausal and female climacteric states: Secondary | ICD-10-CM

## 2014-08-03 DIAGNOSIS — R7989 Other specified abnormal findings of blood chemistry: Secondary | ICD-10-CM

## 2014-08-03 DIAGNOSIS — R232 Flushing: Secondary | ICD-10-CM | POA: Insufficient documentation

## 2014-08-03 DIAGNOSIS — IMO0002 Reserved for concepts with insufficient information to code with codable children: Secondary | ICD-10-CM

## 2014-08-03 LAB — LIPID PANEL
Cholesterol: 98 mg/dL (ref 0–200)
HDL: 43.9 mg/dL (ref >39.00)
LDL CALC: 34 mg/dL (ref 0–99)
NonHDL: 54.1
TRIGLYCERIDES: 101 mg/dL (ref 0.0–149.0)
Total CHOL/HDL Ratio: 2
VLDL: 20.2 mg/dL (ref 0.0–40.0)

## 2014-08-03 LAB — CBC WITH DIFFERENTIAL/PLATELET
BASOS PCT: 0.5 % (ref 0.0–3.0)
Basophils Absolute: 0 10*3/uL (ref 0.0–0.1)
EOS PCT: 2.5 % (ref 0.0–5.0)
Eosinophils Absolute: 0.2 10*3/uL (ref 0.0–0.7)
HEMATOCRIT: 38.5 % (ref 36.0–46.0)
Hemoglobin: 12.9 g/dL (ref 12.0–15.0)
Lymphocytes Relative: 29.6 % (ref 12.0–46.0)
Lymphs Abs: 2.3 10*3/uL (ref 0.7–4.0)
MCHC: 33.5 g/dL (ref 30.0–36.0)
MCV: 87.4 fl (ref 78.0–100.0)
Monocytes Absolute: 0.5 10*3/uL (ref 0.1–1.0)
Monocytes Relative: 6 % (ref 3.0–12.0)
NEUTROS PCT: 61.4 % (ref 43.0–77.0)
Neutro Abs: 4.7 10*3/uL (ref 1.4–7.7)
Platelets: 192 10*3/uL (ref 150.0–400.0)
RBC: 4.41 Mil/uL (ref 3.87–5.11)
RDW: 14.2 % (ref 11.5–15.5)
WBC: 7.7 10*3/uL (ref 4.0–10.5)

## 2014-08-03 LAB — HEPATIC FUNCTION PANEL
ALBUMIN: 3.9 g/dL (ref 3.5–5.2)
ALK PHOS: 62 U/L (ref 39–117)
ALT: 23 U/L (ref 0–35)
AST: 24 U/L (ref 0–37)
Bilirubin, Direct: 0.1 mg/dL (ref 0.0–0.3)
TOTAL PROTEIN: 6.6 g/dL (ref 6.0–8.3)
Total Bilirubin: 0.3 mg/dL (ref 0.2–1.2)

## 2014-08-03 LAB — BASIC METABOLIC PANEL
BUN: 17 mg/dL (ref 6–23)
CALCIUM: 9.5 mg/dL (ref 8.4–10.5)
CHLORIDE: 105 meq/L (ref 96–112)
CO2: 27 meq/L (ref 19–32)
Creatinine, Ser: 0.81 mg/dL (ref 0.40–1.20)
GFR: 75.61 mL/min (ref >60.00)
GLUCOSE: 113 mg/dL — AB (ref 70–99)
Potassium: 4.8 mEq/L (ref 3.5–5.1)
SODIUM: 138 meq/L (ref 135–145)

## 2014-08-03 LAB — TSH: TSH: 4.04 u[IU]/mL (ref 0.35–4.50)

## 2014-08-03 LAB — HEMOGLOBIN A1C: HEMOGLOBIN A1C: 7 % — AB (ref 4.6–6.5)

## 2014-08-03 MED ORDER — LEVOTHYROXINE SODIUM 50 MCG PO TABS: 50.0000 ug | ORAL_TABLET | Freq: Every day | ORAL | Status: DC

## 2014-08-03 MED ORDER — CLONAZEPAM 1 MG PO TABS: 1.0000 mg | ORAL_TABLET | Freq: Every day | ORAL | Status: DC

## 2014-08-03 MED ORDER — INSULIN PEN NEEDLE 31G X 8 MM MISC: Status: DC

## 2014-08-03 MED ORDER — CITALOPRAM HYDROBROMIDE 20 MG PO TABS: 20.0000 mg | ORAL_TABLET | Freq: Every day | ORAL | Status: DC

## 2014-08-03 NOTE — Assessment & Plan Note (Signed)
Chronic problem, rare symptomatic lows.  UTD on eye exam.  Foot exam done today.  On ARB for renal protection.  Check labs.  Adjust meds prn

## 2014-08-03 NOTE — Progress Notes (Signed)
Pre visit review using our clinic review tool, if applicable. No additional management support is needed unless otherwise documented below in the visit note. 

## 2014-08-03 NOTE — Patient Instructions (Signed)
Follow up in 4-6 weeks to recheck hot flashes Start the Celexa once daily to help w/ the flashes and sweats We'll notify you of your lab results and make any changes if needed Have Dr Lynann Bologna send me copies of her notes so I can follow along Call with any questions or concerns Elbert Ewings in there!  We'll figure this out!

## 2014-08-03 NOTE — Assessment & Plan Note (Signed)
Chronic problem.  BP adequately controlled.  + CP- has stress test scheduled w/ Dr Caryl Comes.  Check labs.  No anticipated med changes.  Will follow.

## 2014-08-03 NOTE — Progress Notes (Signed)
   Subjective:    Patient ID: Lacey [name], female    DOB: 1951-04-30, 64 y.o.   MRN: [id number]  HPI DM- chronic problem, on Metformin, Lantus (63 units), Novolog SSI (25 units TID).  On ARB for renal protection.  UTD on eye exam.  Pt has had 2 lows since last visit.  Denies abd pain, N/V/D.    Hyperlipidemia- chronic problem, on Lipitor.  Denies abd pain, N/V.  HTN- chronic problem, on Valsartan, Metoprolol, Lasix.  + CP- has stress test on 4/8.  Denies SOB above baseline.    Hot flashes- ongoing issue for pt.  Sweating 'regardless of the temperature'.   Review of Systems For ROS see HPI     Objective:   Physical Exam  Constitutional: She is oriented to person, place, and time. She appears well-developed and well-nourished. No distress.  obese  HENT:  Head: Normocephalic and atraumatic.  Eyes: Conjunctivae and EOM are normal. Pupils are equal, round, and reactive to light.  Neck: Normal range of motion. Neck supple. No thyromegaly present.  Cardiovascular: Normal rate, regular rhythm, normal heart sounds and intact distal pulses.   No murmur heard. Pulmonary/Chest: Effort normal and breath sounds normal. No respiratory distress.  Abdominal: Soft. She exhibits no distension. There is no tenderness.  Musculoskeletal: She exhibits no edema.  Lymphadenopathy:    She has no cervical adenopathy.  Neurological: She is alert and oriented to person, place, and time.  Skin: Skin is warm and dry.  Psychiatric: She has a normal mood and affect. Her behavior is normal.  Vitals reviewed.         Assessment & Plan:

## 2014-08-03 NOTE — Assessment & Plan Note (Signed)
Ongoing issue for pt.  Due to hx of anxiety and reported sweats, will start low dose SSRI and monitor for improvement.  Pt expressed understanding and is in agreement w/ plan.

## 2014-08-03 NOTE — Assessment & Plan Note (Signed)
Chronic problem.  Tolerating statin w/o difficulty.  Check labs.  Adjust meds prn  

## 2014-08-07 ENCOUNTER — Other Ambulatory Visit: Payer: Self-pay | Admitting: Family Medicine

## 2014-08-08 ENCOUNTER — Encounter: Payer: Self-pay | Admitting: Cardiology

## 2014-08-08 NOTE — Telephone Encounter (Signed)
Med filled.  

## 2014-08-10 ENCOUNTER — Encounter (HOSPITAL_COMMUNITY): Payer: BC Managed Care – PPO

## 2014-08-29 ENCOUNTER — Encounter (HOSPITAL_COMMUNITY): Payer: BC Managed Care – PPO

## 2014-09-05 ENCOUNTER — Other Ambulatory Visit: Payer: Self-pay | Admitting: Family Medicine

## 2014-09-05 NOTE — Telephone Encounter (Signed)
Med filled.  

## 2014-09-07 ENCOUNTER — Ambulatory Visit (INDEPENDENT_AMBULATORY_CARE_PROVIDER_SITE_OTHER): Payer: BC Managed Care – PPO | Admitting: Family Medicine

## 2014-09-07 ENCOUNTER — Encounter: Payer: Self-pay | Admitting: Family Medicine

## 2014-09-07 VITALS — BP 128/82 | HR 67 | Temp 98.1°F | Resp 16 | Wt 235.0 lb

## 2014-09-07 DIAGNOSIS — G2581 Restless legs syndrome: Secondary | ICD-10-CM

## 2014-09-07 DIAGNOSIS — E039 Hypothyroidism, unspecified: Secondary | ICD-10-CM | POA: Insufficient documentation

## 2014-09-07 DIAGNOSIS — E038 Other specified hypothyroidism: Secondary | ICD-10-CM | POA: Diagnosis not present

## 2014-09-07 DIAGNOSIS — R232 Flushing: Secondary | ICD-10-CM

## 2014-09-07 DIAGNOSIS — N951 Menopausal and female climacteric states: Secondary | ICD-10-CM

## 2014-09-07 LAB — TSH: TSH: 1.51 u[IU]/mL (ref 0.35–4.50)

## 2014-09-07 NOTE — Patient Instructions (Signed)
Schedule a diabetes f/u for July We'll notify you of your lab results and make any changes if needed Continue the Citalopram and the Clonazepam for the hot flashes and RLS Keep up the good work!  You look great! Happy Mother's Day!!!

## 2014-09-07 NOTE — Progress Notes (Signed)
   Subjective:    Patient ID: Lacey [name], female    DOB: 1950-10-21, 64 y.o.   MRN: [id number]  HPI Hot flashes- started on Celexa at last visit.  Pt reports hot flashes are better.  No longer sweating w/ minimal activity.  RLS- started on Klonopin.  Pt reports this is also better.  Sleeping better and now has better energy at the end of the work day.  Hypothyroid- pt's TSH was at high end of normal at last visit.  Started on levothyroxine 53mcg.  Pt reports hair loss is slowing.  Due for repeat labs today.   Review of Systems For ROS see HPI     Objective:   Physical Exam  Constitutional: She is oriented to person, place, and time. She appears well-developed and well-nourished. No distress.  HENT:  Head: Normocephalic and atraumatic.  Neck: Normal range of motion. Neck supple. No thyromegaly present.  Cardiovascular: Normal rate, regular rhythm and normal heart sounds.   Pulmonary/Chest: Effort normal. No respiratory distress.  Musculoskeletal: She exhibits no edema.  Lymphadenopathy:    She has no cervical adenopathy.  Neurological: She is alert and oriented to person, place, and time.  Skin: Skin is warm and dry.  Psychiatric: She has a normal mood and affect. Her behavior is normal. Thought content normal.  Vitals reviewed.         Assessment & Plan:

## 2014-09-07 NOTE — Progress Notes (Signed)
Pre visit review using our clinic review tool, if applicable. No additional management support is needed unless otherwise documented below in the visit note. 

## 2014-09-07 NOTE — Assessment & Plan Note (Signed)
New at last labs.  Started on low dose levothyroxine.  Pt reports since starting meds hair loss has slowed.  Repeat TSH today.  Will follow.

## 2014-09-07 NOTE — Assessment & Plan Note (Signed)
Chronic problem.  Much improved since starting Klonopin.  Pt reports improved sleep and energy.  Is very pleased w/ results.  Continue at this time.

## 2014-09-07 NOTE — Assessment & Plan Note (Signed)
Improved since starting Celexa.  Pt is very pleased w/ results.  No med changes at this time.  Will follow.

## 2014-09-09 ENCOUNTER — Other Ambulatory Visit: Payer: Self-pay | Admitting: Family Medicine

## 2014-09-10 NOTE — Telephone Encounter (Signed)
Med filled.  

## 2014-09-24 ENCOUNTER — Other Ambulatory Visit: Payer: Self-pay | Admitting: General Practice

## 2014-09-24 ENCOUNTER — Other Ambulatory Visit: Payer: Self-pay | Admitting: Family Medicine

## 2014-09-24 MED ORDER — LEVOTHYROXINE SODIUM 50 MCG PO TABS: ORAL_TABLET | ORAL | Status: DC

## 2014-09-24 NOTE — Telephone Encounter (Signed)
Med filled.  

## 2014-09-27 ENCOUNTER — Other Ambulatory Visit: Payer: Self-pay | Admitting: Family Medicine

## 2014-10-02 ENCOUNTER — Encounter (HOSPITAL_BASED_OUTPATIENT_CLINIC_OR_DEPARTMENT_OTHER): Payer: BC Managed Care – PPO

## 2014-10-08 ENCOUNTER — Other Ambulatory Visit: Payer: Self-pay | Admitting: Family Medicine

## 2014-10-08 NOTE — Telephone Encounter (Signed)
Med filled.  

## 2014-10-10 ENCOUNTER — Telehealth (HOSPITAL_COMMUNITY): Payer: Self-pay

## 2014-10-10 NOTE — Telephone Encounter (Signed)
Left message on voicemail per DPR in reference to upcoming appointment scheduled on 10-10-2014 at 8:30am with detailed instructions given per Myocardial Perfusion Study Information Sheet for the test. Phone number given for call back for any questions. Oletta Lamas, Haidy Kackley A

## 2014-10-15 ENCOUNTER — Ambulatory Visit (HOSPITAL_COMMUNITY): Payer: BC Managed Care – PPO | Attending: Cardiology

## 2014-10-15 DIAGNOSIS — E119 Type 2 diabetes mellitus without complications: Secondary | ICD-10-CM | POA: Diagnosis not present

## 2014-10-15 DIAGNOSIS — R079 Chest pain, unspecified: Secondary | ICD-10-CM | POA: Diagnosis not present

## 2014-10-15 DIAGNOSIS — R002 Palpitations: Secondary | ICD-10-CM | POA: Diagnosis not present

## 2014-10-15 DIAGNOSIS — I509 Heart failure, unspecified: Secondary | ICD-10-CM | POA: Diagnosis not present

## 2014-10-15 DIAGNOSIS — R0609 Other forms of dyspnea: Secondary | ICD-10-CM | POA: Insufficient documentation

## 2014-10-15 DIAGNOSIS — R5383 Other fatigue: Secondary | ICD-10-CM | POA: Diagnosis not present

## 2014-10-15 DIAGNOSIS — R0602 Shortness of breath: Secondary | ICD-10-CM

## 2014-10-15 DIAGNOSIS — I110 Hypertensive heart disease with heart failure: Secondary | ICD-10-CM | POA: Diagnosis not present

## 2014-10-15 MED ORDER — REGADENOSON 0.4 MG/5ML IV SOLN
0.4000 mg | Freq: Once | INTRAVENOUS | Status: AC
  Administered 2014-10-15: 0.4 mg via INTRAVENOUS

## 2014-10-15 MED ORDER — TECHNETIUM TC 99M SESTAMIBI GENERIC - CARDIOLITE
33.0000 | Freq: Once | INTRAVENOUS | Status: AC | PRN
  Administered 2014-10-15: 33 via INTRAVENOUS

## 2014-10-17 ENCOUNTER — Ambulatory Visit (HOSPITAL_COMMUNITY): Payer: BC Managed Care – PPO | Attending: Cardiology

## 2014-10-17 LAB — MYOCARDIAL PERFUSION IMAGING
CHL CUP NUCLEAR SDS: 0
CHL CUP NUCLEAR SSS: 2
CHL CUP RESTING HR STRESS: 68 {beats}/min
LV dias vol: 131 mL
LV sys vol: 59 mL
Nuc Stress EF: 55 %
Peak HR: 85 {beats}/min
RATE: 0.29
SRS: 21
TID: 1.02

## 2014-10-17 MED ORDER — TECHNETIUM TC 99M SESTAMIBI GENERIC - CARDIOLITE
33.0000 | Freq: Once | INTRAVENOUS | Status: AC | PRN
  Administered 2014-10-17: 33 via INTRAVENOUS

## 2014-10-19 ENCOUNTER — Ambulatory Visit (HOSPITAL_BASED_OUTPATIENT_CLINIC_OR_DEPARTMENT_OTHER): Payer: BC Managed Care – PPO | Attending: Internal Medicine

## 2014-10-19 VITALS — Ht 62.0 in | Wt 230.0 lb

## 2014-10-19 DIAGNOSIS — G4730 Sleep apnea, unspecified: Secondary | ICD-10-CM

## 2014-10-19 DIAGNOSIS — G4733 Obstructive sleep apnea (adult) (pediatric): Secondary | ICD-10-CM

## 2014-10-19 DIAGNOSIS — G2581 Restless legs syndrome: Secondary | ICD-10-CM | POA: Insufficient documentation

## 2014-10-19 DIAGNOSIS — G4719 Other hypersomnia: Secondary | ICD-10-CM

## 2014-10-19 DIAGNOSIS — R0683 Snoring: Secondary | ICD-10-CM | POA: Insufficient documentation

## 2014-10-23 ENCOUNTER — Telehealth: Payer: Self-pay | Admitting: Cardiology

## 2014-10-23 DIAGNOSIS — G4733 Obstructive sleep apnea (adult) (pediatric): Secondary | ICD-10-CM | POA: Insufficient documentation

## 2014-10-23 DIAGNOSIS — G4719 Other hypersomnia: Secondary | ICD-10-CM | POA: Insufficient documentation

## 2014-10-23 NOTE — Telephone Encounter (Signed)
Please let patient know that they have mild sleep apnea.   Please set up OV at next available to discuss treatment options.

## 2014-10-23 NOTE — Sleep Study (Addendum)
NAME: Lacey [name] DATE OF BIRTH:  [birth year] MEDICAL RECORD NUMBER [id number]  LOCATION: [facility]  PHYSICIAN: Travious Vanover R  DATE OF STUDY: 10/19/2014  SLEEP STUDY TYPE: Nocturnal Polysomnogram               REFERRING PHYSICIAN: Deboraha Sprang, MD  INDICATION FOR STUDY: excessive daytime sleepiness. nonrestorative sleep, snoring  EPWORTH SLEEPINESS SCORE: 11 HEIGHT: 5\' 2"  (157.5 cm)  WEIGHT: 230 lb (104.327 kg)    Body mass index is 42.06 kg/(m^2).  NECK SIZE:   in.  MEDICATIONS: Reviewed in the chart  SLEEP ARCHITECTURE: The patient slept for a total of 296 minutes out of a total sleep period time of 344 minutes.  There was no slow wave sleep and 51 minutes of REM sleep.  The onset to sleep latency was prolonged at 32 minutes.  The onset to REM sleep latency was prolonged at 244 minutes.  The sleep efficiency was reduced at 79%.    RESPIRATORY DATA: There were 26 apneas, of which, 17 were obstructive, 7 were central and 2 were mixed.  There were 11 hypopneas.  The overall AHI was 7.5 events per hour consistent with mild obstructive sleep apnea/hyponea syndrome.  Most events occurred during REM sleep in the supine position.  The AHI during REM sleep ate 23 events per hour.  There was moderate to severe snoring.  OXYGEN DATA: The average oxygen saturation was 94%.  The lowest oxygen saturation was 89%.    CARDIAC DATA: The patient maintained NSR thoughout the study. Th average heart rate was 63 bpm.  The lowest heart rate was 35 bpm and the fastest heart rate was 98 bpm.    MOVEMENT/PARASOMNIA: There were an increased number of periodic limb movements during the study with a PLMS index of 25 movements per hour. There were no REM sleep behavior disorders noted.    IMPRESSION/ RECOMMENDATION:   1.  Mild obstructive sleep apnea/hypopnea syndrome with an AHI of 7.5 events per hour.   Most events occurred during REM sleep in the supine position. 2.  Moderate to  severe snoring was noted.   3.  Reduced sleep efficiency with increased frequency of arousals mainly due to spontaneous arousals and periodic limb movements.  4.  Abnormal sleep architecture with no slow wave sleep and prolonged latency to REM sleep.  The patient is on SSRI therapy which is known to suppress REM sleep. 5.  Minimal oxygen desaturations with respiratory events. 6.  Periodic limb movements were noted with an elevated PLMS index of 25 per hour. The patient has a history of restless leg syndrome and should be treated for such as needed. 7.  Treatment options include a trial of CPAP therapy to see if daytime sleepiness improves, referral to ENT to evaluate for surgical causes of mild sleep apnea and snoring or referral to dentistry for oral device.   8.  Treatment would also include careful attention to proper sleep hygiene, weight reduction for elevated BMI, avoidance of sleeping in the supine position and avoidance of alcohol within four hours of bedtime.  Specific treatment decisions should be tailored to each patient based upon the clinical situation and all treatment options should be considered.  The patient should be instructed to avoid driving if sleepy and careful clinical follow up is needed to ensure that the patient's symptoms are improving with therapy and the PAP adherence is supported and measured if prescribed.    Signed: Sueanne Margarita Diplomate, American Board  of Sleep Medicine  ELECTRONICALLY SIGNED ON:  10/23/2014, 8:22 PM [place] PH: [phone]   FX: [phone] [place]

## 2014-10-24 NOTE — Telephone Encounter (Signed)
Patient is aware of results.   Appointment scheduled for 12/24/14 10am.

## 2014-10-29 ENCOUNTER — Other Ambulatory Visit: Payer: Self-pay | Admitting: Family Medicine

## 2014-10-29 NOTE — Telephone Encounter (Signed)
Med filled.  

## 2014-11-12 ENCOUNTER — Other Ambulatory Visit: Payer: Self-pay | Admitting: Internal Medicine

## 2014-11-15 ENCOUNTER — Ambulatory Visit (INDEPENDENT_AMBULATORY_CARE_PROVIDER_SITE_OTHER): Payer: BC Managed Care – PPO | Admitting: Family Medicine

## 2014-11-15 ENCOUNTER — Encounter: Payer: Self-pay | Admitting: Family Medicine

## 2014-11-15 VITALS — BP 154/76 | HR 68 | Temp 98.5°F | Resp 16 | Ht 62.0 in | Wt 239.6 lb

## 2014-11-15 DIAGNOSIS — E1165 Type 2 diabetes mellitus with hyperglycemia: Secondary | ICD-10-CM

## 2014-11-15 DIAGNOSIS — I110 Hypertensive heart disease with heart failure: Secondary | ICD-10-CM

## 2014-11-15 DIAGNOSIS — I509 Heart failure, unspecified: Secondary | ICD-10-CM

## 2014-11-15 DIAGNOSIS — E1140 Type 2 diabetes mellitus with diabetic neuropathy, unspecified: Secondary | ICD-10-CM | POA: Diagnosis not present

## 2014-11-15 DIAGNOSIS — IMO0002 Reserved for concepts with insufficient information to code with codable children: Secondary | ICD-10-CM

## 2014-11-15 LAB — BASIC METABOLIC PANEL
BUN: 13 mg/dL (ref 6–23)
CALCIUM: 9.1 mg/dL (ref 8.4–10.5)
CHLORIDE: 106 meq/L (ref 96–112)
CO2: 28 meq/L (ref 19–32)
Creatinine, Ser: 0.76 mg/dL (ref 0.40–1.20)
GFR: 81.3 mL/min (ref >60.00)
Glucose, Bld: 109 mg/dL — ABNORMAL HIGH (ref 70–99)
Potassium: 4.1 mEq/L (ref 3.5–5.1)
SODIUM: 141 meq/L (ref 135–145)

## 2014-11-15 LAB — TSH: TSH: 1.54 u[IU]/mL (ref 0.35–4.50)

## 2014-11-15 LAB — HEMOGLOBIN A1C: Hgb A1c MFr Bld: 8.1 % — ABNORMAL HIGH (ref 4.6–6.5)

## 2014-11-15 MED ORDER — GLUCOSE BLOOD VI STRP: ORAL_STRIP | Status: DC

## 2014-11-15 MED ORDER — GABAPENTIN 300 MG PO CAPS: 300.0000 mg | ORAL_CAPSULE | Freq: Every day | ORAL | Status: DC

## 2014-11-15 NOTE — Progress Notes (Signed)
Pre visit review using our clinic review tool, if applicable. No additional management support is needed unless otherwise documented below in the visit note. 

## 2014-11-15 NOTE — Assessment & Plan Note (Signed)
Chronic problem.  Pt reports CBGs have been very high recently.  Pt isn't clear if her meter is accurate b/c her sugars are not typically high.  Has gained 10 lbs since last visit.  UTD on eye exam, foot exam.  On ARB for renal protection.  Pt will stop Metformin due to loose and frequent stools.  Depending on her A1C and Cr, we will determine what medication changes are appropriate.  Stressed need for healthy diet and regular exercise.  Will continue to follow closely.

## 2014-11-15 NOTE — Assessment & Plan Note (Signed)
Deteriorated.  Pt's BP is higher than usual for her.  Admits she doesn't like the drive over here.  Asymptomatic at this time.  Will likely need meds adjusted but will determine current Cr prior to making changes.  Pt expressed understanding and is in agreement w/ plan.

## 2014-11-15 NOTE — Progress Notes (Signed)
   Subjective:    Patient ID: Lacey [name], female    DOB: 1950/05/12, 64 y.o.   MRN: [id number]  HPI DM- chronic problem, on Metformin, Lantus 63 units nightly, Novolog 25 units TID.  On ARB for renal protection.  UTD on eye exam- due in Aug.  UTD on foot exam.  Pt reports CBGs are 'not good'- '2,3,4 hundreds'.  CBG 158 this AM.  Pt has determined that Metformin is the cause of her loose and frequent stools.  No CP, SOB, HAs, visual changes, edema.  Ongoing numbness of hands/feet.  Pt has gained 10 lbs since last visit- upset by this.  BP is also higher than usual for her.   Review of Systems For ROS see HPI     Objective:   Physical Exam  Constitutional: She is oriented to person, place, and time. She appears well-developed and well-nourished. No distress.  HENT:  Head: Normocephalic and atraumatic.  Eyes: Conjunctivae and EOM are normal. Pupils are equal, round, and reactive to light.  Neck: Normal range of motion. Neck supple. No thyromegaly present.  Cardiovascular: Normal rate, regular rhythm, normal heart sounds and intact distal pulses.   No murmur heard. Pulmonary/Chest: Effort normal and breath sounds normal. No respiratory distress.  Abdominal: Soft. She exhibits no distension. There is no tenderness.  Musculoskeletal: She exhibits no edema.  Lymphadenopathy:    She has no cervical adenopathy.  Neurological: She is alert and oriented to person, place, and time.  Skin: Skin is warm and dry.  Psychiatric: She has a normal mood and affect. Her behavior is normal.  Vitals reviewed.         Assessment & Plan:

## 2014-11-15 NOTE — Patient Instructions (Signed)
Schedule your complete physical in 3-4 months We'll notify you of your lab results and make any changes if needed STOP the Metformin- we'll figure out new meds based on your results Try and make healthy food choices and get regular exercise Call with any questions or concerns Have a great summer!!!

## 2014-11-16 ENCOUNTER — Other Ambulatory Visit: Payer: Self-pay | Admitting: General Practice

## 2014-11-16 MED ORDER — VALSARTAN 320 MG PO TABS
320.0000 mg | ORAL_TABLET | Freq: Every day | ORAL | Status: DC
Start: 2014-11-16 — End: 2015-01-22

## 2014-11-16 MED ORDER — CANAGLIFLOZIN 100 MG PO TABS: 100.0000 mg | ORAL_TABLET | Freq: Every day | ORAL | Status: DC

## 2014-11-19 ENCOUNTER — Other Ambulatory Visit: Payer: Self-pay | Admitting: Family Medicine

## 2014-11-19 NOTE — Telephone Encounter (Signed)
Med filled.  

## 2014-11-22 ENCOUNTER — Encounter: Payer: Self-pay | Admitting: Family Medicine

## 2014-11-22 MED ORDER — GLUCOSE BLOOD VI STRP: ORAL_STRIP | Status: DC

## 2014-11-22 NOTE — Telephone Encounter (Signed)
Chart updated to reflect glucose strip change.

## 2014-11-30 LAB — HM DIABETES EYE EXAM

## 2014-12-13 ENCOUNTER — Encounter: Payer: Self-pay | Admitting: Cardiology

## 2014-12-18 ENCOUNTER — Encounter: Payer: Self-pay | Admitting: General Practice

## 2014-12-18 LAB — HM MAMMOGRAPHY

## 2014-12-20 ENCOUNTER — Ambulatory Visit (INDEPENDENT_AMBULATORY_CARE_PROVIDER_SITE_OTHER): Payer: BC Managed Care – PPO | Admitting: Family Medicine

## 2014-12-20 ENCOUNTER — Encounter: Payer: Self-pay | Admitting: Family Medicine

## 2014-12-20 ENCOUNTER — Encounter: Payer: Self-pay | Admitting: General Practice

## 2014-12-20 VITALS — BP 122/78 | HR 78 | Temp 98.1°F | Resp 16 | Ht 62.0 in | Wt 237.2 lb

## 2014-12-20 DIAGNOSIS — I110 Hypertensive heart disease with heart failure: Secondary | ICD-10-CM | POA: Diagnosis not present

## 2014-12-20 DIAGNOSIS — E1140 Type 2 diabetes mellitus with diabetic neuropathy, unspecified: Secondary | ICD-10-CM

## 2014-12-20 DIAGNOSIS — E1165 Type 2 diabetes mellitus with hyperglycemia: Secondary | ICD-10-CM

## 2014-12-20 DIAGNOSIS — I509 Heart failure, unspecified: Secondary | ICD-10-CM

## 2014-12-20 DIAGNOSIS — Z720 Tobacco use: Secondary | ICD-10-CM

## 2014-12-20 DIAGNOSIS — IMO0002 Reserved for concepts with insufficient information to code with codable children: Secondary | ICD-10-CM

## 2014-12-20 NOTE — Progress Notes (Signed)
Pre visit review using our clinic review tool, if applicable. No additional management support is needed unless otherwise documented below in the visit note. 

## 2014-12-20 NOTE — Assessment & Plan Note (Signed)
30+ pack years of smoking.  Sister w/ lung cancer.  Pt interested in low dose CT screen.  Order entered.

## 2014-12-20 NOTE — Assessment & Plan Note (Signed)
Chronic problem.  Pt's BP is now at goal since increasing Valsartan.  Asymptomatic at this time.  Check BMP.  No anticipated med changes.

## 2014-12-20 NOTE — Patient Instructions (Signed)
Follow up as scheduled We'll notify you of your lab results and make any changes if needed Continue your current meds- BP looks great! We'll call you with your CT scan appt Call with any questions or concerns ENJOY YOUR LAST BACK TO SCHOOL!!!

## 2014-12-20 NOTE — Progress Notes (Signed)
   Subjective:    Patient ID: Lacey [name], female    DOB: 24-Mar-1951, 64 y.o.   MRN: [id number]  HPI HTN- chronic problem, Valsartan was increased to 320mg  at last visit.  Also on Metoprolol, Lasix.  BP is much better today.  Denies CP, SOB above baseline, HAs, visual changes, edema.  DM- chronic problem, added Invokana at last visit.  Also on Novolog, Lantus.  Pt reports CBGs are still high.  Denies symptomatic lows.  Tobacco use- >30 pack years.  Interested in screening CT.   Review of Systems For ROS see HPI     Objective:   Physical Exam  Constitutional: She is oriented to person, place, and time. She appears well-developed and well-nourished. No distress.  HENT:  Head: Normocephalic and atraumatic.  Eyes: Conjunctivae and EOM are normal. Pupils are equal, round, and reactive to light.  Neck: Normal range of motion. Neck supple. No thyromegaly present.  Cardiovascular: Normal rate, regular rhythm, normal heart sounds and intact distal pulses.   No murmur heard. Pulmonary/Chest: Effort normal and breath sounds normal. No respiratory distress.  Abdominal: Soft. She exhibits no distension. There is no tenderness.  Musculoskeletal: She exhibits no edema.  Lymphadenopathy:    She has no cervical adenopathy.  Neurological: She is alert and oriented to person, place, and time.  Skin: Skin is warm and dry.  Psychiatric: She has a normal mood and affect. Her behavior is normal.  Vitals reviewed.         Assessment & Plan:

## 2014-12-20 NOTE — Assessment & Plan Note (Signed)
Pt started Invokana at last visit.  Still having hard time controlling sugars.  Check BMP w/ addition of Invokana.  Will continue to follow closely

## 2014-12-24 ENCOUNTER — Ambulatory Visit: Payer: BC Managed Care – PPO | Admitting: Cardiology

## 2014-12-27 ENCOUNTER — Encounter: Payer: Self-pay | Admitting: Family Medicine

## 2015-01-03 ENCOUNTER — Encounter: Payer: Self-pay | Admitting: Cardiology

## 2015-01-10 ENCOUNTER — Telehealth: Payer: Self-pay | Admitting: *Deleted

## 2015-01-10 NOTE — Telephone Encounter (Signed)
PA for citalopram initiated. Awaiting determination. JG//CMA

## 2015-01-20 ENCOUNTER — Other Ambulatory Visit: Payer: Self-pay | Admitting: Family Medicine

## 2015-01-21 ENCOUNTER — Other Ambulatory Visit: Payer: Self-pay | Admitting: General Practice

## 2015-01-21 MED ORDER — CLONAZEPAM 1 MG PO TABS: 1.0000 mg | ORAL_TABLET | Freq: Every day | ORAL | Status: DC

## 2015-01-21 NOTE — Telephone Encounter (Signed)
Medication filled to pharmacy as requested.   

## 2015-01-21 NOTE — Telephone Encounter (Signed)
Last OV 12-20-14 Clonazepam last filled 08-03-14 #90 with 1

## 2015-01-22 ENCOUNTER — Other Ambulatory Visit: Payer: Self-pay | Admitting: General Practice

## 2015-01-22 MED ORDER — CANAGLIFLOZIN 100 MG PO TABS: 100.0000 mg | ORAL_TABLET | Freq: Every day | ORAL | Status: DC

## 2015-01-22 MED ORDER — VALSARTAN 320 MG PO TABS: 320.0000 mg | ORAL_TABLET | Freq: Every day | ORAL | Status: DC

## 2015-01-22 MED ORDER — CITALOPRAM HYDROBROMIDE 20 MG PO TABS: ORAL_TABLET | ORAL | Status: DC

## 2015-02-06 ENCOUNTER — Ambulatory Visit: Payer: BC Managed Care – PPO | Admitting: Cardiology

## 2015-02-18 NOTE — Progress Notes (Signed)
Cardiology Office Note   Date:  02/19/2015   ID:  Lacey [name], DOB October 16, 1950, MRN [id number]  PCP:  Lacey Asa, MD    Chief Complaint  Patient presents with  . Sleep Apnea  . Hypertension      History of Present Illness: Lacey [name] is a 64 y.o. female who presents for evaluation of OSA.  She was referred for PSG due to excessive daytime sleepiness and nonrestorative sleep.  Her Epworth sleepiness scale was 11.  She was found to have mild OSA with an AHI of 7.5/hr with most events occurring during REM sleep in the supine position.  There were minimal oxygen desaturations noted.  She is now here to discuss treatment options.  She says that she normally sleeps on her back at home.  She snores and wakes herself up snoring.  She feels very tired when she wakes up in the am and has a lot of daytime sleepiness.  She will nap late in the evening.  She does not drink ETOH.      Past Medical History  Diagnosis Date  . Hyperlipidemia   . Hypertension   . Vitamin D deficiency   . Adenomatous colon polyp     hyperplastic  . Diverticulosis   . Diabetes mellitus, type 2 (HCC)     ORAL AND INSULIN MEDS (LAST A1C  7.3)  . Atrial tachycardia ([place]) CARDIOLOGIST - DR Caryl Comes (LAST VISIT AUG 2012)    Echo 12/11: EF 55-60%, Mild LVH, grade 1 diast dysfxn;   holter 1/12: ATach  . Neuropathy, peripheral ([place])   . Erythema CURRENT-- CLOSED ABD. WALL ABSCESS    PER PCP NOTE (03-31-11)- MRSA-- TAKES DOXYCYCLINE  . Sepsis, urinary HISTORY - 2004  . Restless leg syndrome   . Insomnia     TAKES MEDS PRN  . Arthritis     NECK, KNEES, FINGERS, TOES  . GERD (gastroesophageal reflux disease)     CONTROLLED W/ PREVACID  . Atypical chest pain     a. 07/2004 Cath: Clean cors;  b. 04/2010 Myoview: EF 63%, no ischemia  . Anginal pain ([place])     not recent  . Pneumonia     as child  . History of kidney stones 2012  . Post op infection 11/07/12    left bunionectomy    Past  Surgical History  Procedure Laterality Date  . Laparoscopic cholecystectomy  2001  . Carpal tunnel release  2000    RIGHT  . Cervical fusion  10-12-07    C5 - 7  . Right thumb joint surg.   MAY 2011  . Gastric bypass  1981  . Knee arthroscopy  AUG 2012    LEFT  . Cystoscopy/retrograde/ureteroscopy/stone extraction with basket  X2 2004 & 2009    LEFT   . Ureteral stent placement  X2  2004  &  2009    LEFT  . Percutaneous nephrostolithotomy  02-27-11    LEFT  . Trigger finger release  2010    RIGHT THUMB  . Ureteroscopy  04/06/2011    Procedure: URETEROSCOPY;  Surgeon: Claybon Jabs, MD;  Location: [facility];  Service: Urology;  Laterality: Left;  L Ureteroscopy Laser Litho & Stent   . Cardiac catheterization  07/10/04  . Abdominal hysterectomy  1995    ovaries remain  . Left thumb joint surgery  2013  . Bunionectomy Left 10/24/12  .  Cystoscopy with retrograde pyelogram, ureteroscopy and stent placement Left 11/28/2012    Procedure: LEFT RETROGRADE PYELOGRAM, LEFT URETEROSCOPY, ;  Surgeon: Claybon Jabs, MD;  Location: [facility];  Service: Urology;  Laterality: Left;     Current Outpatient Prescriptions  Medication Sig Dispense Refill  . aspirin EC 81 MG tablet Take 81 mg by mouth daily.    Marland Kitchen atorvastatin (LIPITOR) 20 MG tablet TAKE 1 TABLET AT BEDTIME 90 tablet 1  . B-D ULTRAFINE III SHORT PEN 31G X 8 MM MISC USE FOUR TIMES A DAY 360 each 0  . canagliflozin (INVOKANA) 100 MG TABS tablet Take 1 tablet (100 mg total) by mouth daily. 90 tablet 1  . celecoxib (CELEBREX) 200 MG capsule Take 200 mg by mouth every morning.    . citalopram (CELEXA) 20 MG tablet TAKE 1 TABLET (20 MG TOTAL) BY MOUTH DAILY. 90 tablet 1  . clonazePAM (KLONOPIN) 1 MG tablet Take 1 tablet (1 mg total) by mouth at bedtime. 1 tab nightly for restless leg. 90 tablet 1  . clotrimazole-betamethasone (LOTRISONE) cream Apply 1 application topically 2 (two) times daily. 45 g 0  . cyclobenzaprine  (FLEXERIL) 10 MG tablet Take 1 tablet (10 mg total) by mouth 3 (three) times daily as needed for muscle spasms. 30 tablet 0  . diphenoxylate-atropine (LOMOTIL) 2.5-0.025 MG per tablet Take 1 tablet by mouth 4 (four) times daily as needed for diarrhea or loose stools. 30 tablet 0  . fenofibrate 160 MG tablet TAKE 1 TABLET DAILY 90 tablet 1  . furosemide (LASIX) 20 MG tablet TAKE 1 TABLET DAILY 30 tablet 3  . gabapentin (NEURONTIN) 300 MG capsule Take 1 capsule (300 mg total) by mouth at bedtime. 90 capsule 3  . glucose blood (ONETOUCH VERIO) test strip Use on e strip each time sugars are tested, pt tests 4 times daily. Dx. E11.40 200 each 12  . HYDROcodone-acetaminophen (NORCO/VICODIN) 5-325 MG per tablet Take 1 tablet by mouth 2 (two) times daily as needed.  0  . hydrocortisone valerate cream (WESTCORT) 0.2 % APPLY SPARINGLY TWICE A DAY 15 g 3  . lansoprazole (PREVACID) 15 MG capsule Take 15 mg by mouth daily.    Marland Kitchen LANTUS SOLOSTAR 100 UNIT/ML Solostar Pen INJECT 62 UNITS DAILY 4 pen 2  . levothyroxine (SYNTHROID, LEVOTHROID) 50 MCG tablet TAKE 1 TABLET (50 MCG TOTAL) BY MOUTH DAILY. 30 tablet 5  . loperamide (IMODIUM) 2 MG capsule Take 2 mg by mouth 4 (four) times daily as needed for diarrhea or loose stools.    . meclizine (ANTIVERT) 25 MG tablet     . metoprolol succinate (TOPROL-XL) 50 MG 24 hr tablet TAKE AS INSTRUCTED BY YOUR PRESCRIBER 180 tablet 1  . nitrofurantoin, macrocrystal-monohydrate, (MACROBID) 100 MG capsule TAKE 1 CAPSULE TWICE A DAY FOR 7 DAYS IN CASE OF URINARY TRACT INFECTION REPEAT AS NEEDED. 56 capsule 2  . NOVOLOG FLEXPEN 100 UNIT/ML FlexPen INJECT 24 UNITS UNDER THE SKIN THREE TIMES A DAY BEFORE MEALS PER SLIDING SCALE 3 pen 6  . ONETOUCH DELICA LANCETS 37J MISC USE FOUR TIMES A DAY 360 each 6  . oxyCODONE-acetaminophen (PERCOCET/ROXICET) 5-325 MG per tablet   0  . PREVALITE 4 G packet MIX AND DRINK 1 PACKET TWICE A DAY 180 packet 2  . promethazine (PHENERGAN) 25 MG tablet  Take 1 tablet (25 mg total) by mouth every 6 (six) hours as needed for nausea or vomiting. 30 tablet 0  . traZODone (DESYREL) 50 MG tablet TAKE ONE-HALF (  1/2) TO 1 TABLET AT BEDTIME AS NEEDED FOR SLEEP 90 tablet 1  . valsartan (DIOVAN) 320 MG tablet Take 1 tablet (320 mg total) by mouth daily. 90 tablet 1  . Vitamin D, Ergocalciferol, (DRISDOL) 50000 UNITS CAPS capsule Take 1 capsule (50,000 Units total) by mouth every 7 (seven) days. 12 capsule 0   No current facility-administered medications for this visit.    Allergies:   Review of patient's allergies indicates no known allergies.    Social History:  The patient  reports that she has been smoking Cigarettes.  She has a 7.5 pack-year smoking history. She has never used smokeless tobacco. She reports that she does not drink alcohol or use illicit drugs.   Family History:  The patient's family history includes Breast cancer in an other family member; Cancer (age of onset: 31) in her sister; Diabetes in an other family member; Heart attack in her father; Heart disease in her sister and another family member; Hyperlipidemia in her mother; Hypertension in her mother, sister, sister, sister, and sister.    ROS:  Please see the history of present illness.   Otherwise, review of systems are positive for none.   All other systems are reviewed and negative.    PHYSICAL EXAM: VS:  BP 130/58 mmHg  Pulse 68  Ht 5\' 2"  (1.575 m)  Wt 237 lb 12.8 oz (107.865 kg)  BMI 43.48 kg/m2  SpO2 97%  LMP 06/06/1993 , BMI Body mass index is 43.48 kg/(m^2). GEN: Well nourished, well developed, in no acute distress HEENT: normal Neck: no JVD, carotid bruits, or masses Cardiac: RRR; no murmurs, rubs, or gallops,no edema  Respiratory:  clear to auscultation bilaterally, normal work of breathing GI: soft, nontender, nondistended, + BS MS: no deformity or atrophy Skin: warm and dry, no rash Neuro:  Strength and sensation are intact Psych: euthymic mood, full  affect   EKG:  EKG is not ordered today.    Recent Labs: 08/03/2014: ALT 23; Hemoglobin 12.9; Platelets 192.0 11/15/2014: BUN 13; Creatinine, Ser 0.76; Potassium 4.1; Sodium 141; TSH 1.54    Lipid Panel    Component Value Date/Time   CHOL 98 08/03/2014 0905   TRIG 101.0 08/03/2014 0905   HDL 43.90 08/03/2014 0905   CHOLHDL 2 08/03/2014 0905   VLDL 20.2 08/03/2014 0905   LDLCALC 34 08/03/2014 0905   LDLDIRECT 55.9 06/22/2011 0848      Wt Readings from Last 3 Encounters:  02/19/15 237 lb 12.8 oz (107.865 kg)  12/20/14 237 lb 4 oz (107.616 kg)  11/15/14 239 lb 9.6 oz (108.682 kg)        ASSESSMENT AND PLAN:  1.  Mild OSA with AHI 7/hr and no significant oxygen desaturations.  Most events occurred while patient was supine.  I have encouraged her to try to sleep on her side.  She also complains of wheezing in her throat when lying flat and constant throat clearing.  I do not see anything in the posterior oropharynx.  I will refer her to ENT for further evaluation and I will send her to Dr. Toy Cookey to evaluate to see if she is a candidate for an oral device.  I do not think her OSA is severe enough to warrant CPAP especially since she does not have oxygen desaturations.   2.  HTN - controlled 3.  Excessive daytime sleepiness secondary to #1 4.  Obesity - I encouraged her to get into a routine exercise program.     Current medicines  are reviewed at length with the patient today.  The patient does not have concerns regarding medicines.  The following changes have been made:  no change  Labs/ tests ordered today: See above Assessment and Plan No orders of the defined types were placed in this encounter.     Disposition:   FU with me  in 6 months  Signed, Sueanne Margarita, MD  02/19/2015 9:42 AM    [facility] [address], [place], [place]  [address] Phone: [phone]; Fax: [phone]

## 2015-02-19 ENCOUNTER — Ambulatory Visit (INDEPENDENT_AMBULATORY_CARE_PROVIDER_SITE_OTHER): Payer: BC Managed Care – PPO | Admitting: Cardiology

## 2015-02-19 ENCOUNTER — Encounter: Payer: Self-pay | Admitting: Cardiology

## 2015-02-19 VITALS — BP 130/58 | HR 68 | Ht 62.0 in | Wt 237.8 lb

## 2015-02-19 DIAGNOSIS — G4733 Obstructive sleep apnea (adult) (pediatric): Secondary | ICD-10-CM

## 2015-02-19 DIAGNOSIS — I509 Heart failure, unspecified: Secondary | ICD-10-CM

## 2015-02-19 DIAGNOSIS — E669 Obesity, unspecified: Secondary | ICD-10-CM | POA: Diagnosis not present

## 2015-02-19 DIAGNOSIS — I110 Hypertensive heart disease with heart failure: Secondary | ICD-10-CM | POA: Diagnosis not present

## 2015-02-19 DIAGNOSIS — G4719 Other hypersomnia: Secondary | ICD-10-CM

## 2015-02-19 HISTORY — DX: Obesity, unspecified: E66.9

## 2015-02-19 NOTE — Patient Instructions (Signed)
Medication Instructions:  Your physician recommends that you continue on your current medications as directed. Please refer to the Current Medication list given to you today.   Labwork: None  Testing/Procedures: None  Follow-Up: You have been referred to Dr. Toy Cookey.  You have been referred to Dr. Erik Obey.  Your physician wants you to follow-up in: 6 months with Dr. Radford Pax. You will receive a reminder letter in the mail two months in advance. If you don't receive a letter, please call our office to schedule the follow-up appointment.   Any Other Special Instructions Will Be Listed Below (If Applicable).

## 2015-02-20 ENCOUNTER — Ambulatory Visit: Payer: BC Managed Care – PPO | Admitting: Physician Assistant

## 2015-03-01 ENCOUNTER — Encounter: Payer: Self-pay | Admitting: Family Medicine

## 2015-03-01 ENCOUNTER — Ambulatory Visit (INDEPENDENT_AMBULATORY_CARE_PROVIDER_SITE_OTHER): Payer: BC Managed Care – PPO | Admitting: Family Medicine

## 2015-03-01 VITALS — BP 128/60 | HR 79 | Temp 98.0°F | Resp 16 | Ht 62.0 in | Wt 239.2 lb

## 2015-03-01 DIAGNOSIS — N611 Abscess of the breast and nipple: Secondary | ICD-10-CM

## 2015-03-01 MED ORDER — DOXYCYCLINE HYCLATE 100 MG PO TABS: 100.0000 mg | ORAL_TABLET | Freq: Two times a day (BID) | ORAL | Status: DC

## 2015-03-01 NOTE — Progress Notes (Signed)
Pre visit review using our clinic review tool, if applicable. No additional management support is needed unless otherwise documented below in the visit note. 

## 2015-03-01 NOTE — Patient Instructions (Signed)
Follow up as needed Start the Doxycycline twice daily- take w/ food Keep area clean, dry, and covered Call with any questions or concerns Happy Halloween!!

## 2015-03-01 NOTE — Assessment & Plan Note (Signed)
New.  Culture collected.  Copious amount of pus expressed in office w/ gentle pressure.  Start abx.  Reviewed supportive care and red flags that should prompt return.  Pt expressed understanding and is in agreement w/ plan.

## 2015-03-01 NOTE — Progress Notes (Signed)
   Subjective:    Patient ID: Lacey [name], female    DOB: 1951-04-04, 64 y.o.   MRN: [id number]  HPI L breast mass- pt reports she was showering last night and had pain under L breast.  'i felt a big old knot'.  Pt reports area is red, painful.  Pt had a cyst noted at 12 o'clock in L breast on mammo/US in August.   Review of Systems For ROS see HPI     Objective:   Physical Exam  Constitutional: She is oriented to person, place, and time. She appears well-developed and well-nourished. No distress.  HENT:  Head: Normocephalic and atraumatic.  Neurological: She is alert and oriented to person, place, and time.  Skin: Skin is warm and dry. There is erythema (pt w/ abscess under L breast- copious amount of pus expressed w/ gentle pressure w/ near instant improvement in pain).  Psychiatric: She has a normal mood and affect. Her behavior is normal.  Vitals reviewed.         Assessment & Plan:

## 2015-03-04 ENCOUNTER — Other Ambulatory Visit: Payer: Self-pay | Admitting: Family Medicine

## 2015-03-04 LAB — WOUND CULTURE: GRAM STAIN: NONE SEEN

## 2015-03-04 NOTE — Telephone Encounter (Signed)
Medication filled to pharmacy as requested.   

## 2015-03-07 ENCOUNTER — Other Ambulatory Visit: Payer: Self-pay | Admitting: Family Medicine

## 2015-03-07 NOTE — Telephone Encounter (Signed)
Medication filled to pharmacy as requested.   

## 2015-03-10 ENCOUNTER — Other Ambulatory Visit: Payer: Self-pay | Admitting: Internal Medicine

## 2015-03-18 ENCOUNTER — Other Ambulatory Visit: Payer: Self-pay | Admitting: Family Medicine

## 2015-03-20 ENCOUNTER — Other Ambulatory Visit: Payer: Self-pay | Admitting: Internal Medicine

## 2015-03-20 MED ORDER — METOPROLOL SUCCINATE ER 50 MG PO TB24: ORAL_TABLET | ORAL | Status: DC

## 2015-03-25 ENCOUNTER — Other Ambulatory Visit: Payer: Self-pay | Admitting: Family Medicine

## 2015-03-25 NOTE — Telephone Encounter (Signed)
Medication filled to pharmacy as requested.   

## 2015-04-04 ENCOUNTER — Other Ambulatory Visit: Payer: Self-pay | Admitting: Family Medicine

## 2015-04-04 ENCOUNTER — Encounter: Payer: Self-pay | Admitting: Family Medicine

## 2015-04-04 DIAGNOSIS — E1165 Type 2 diabetes mellitus with hyperglycemia: Principal | ICD-10-CM

## 2015-04-04 DIAGNOSIS — IMO0002 Reserved for concepts with insufficient information to code with codable children: Secondary | ICD-10-CM

## 2015-04-04 DIAGNOSIS — E1140 Type 2 diabetes mellitus with diabetic neuropathy, unspecified: Secondary | ICD-10-CM

## 2015-04-05 ENCOUNTER — Encounter: Payer: Self-pay | Admitting: General Practice

## 2015-04-19 ENCOUNTER — Other Ambulatory Visit: Payer: Self-pay | Admitting: Family Medicine

## 2015-04-19 NOTE — Telephone Encounter (Signed)
Medication filled to pharmacy as requested.   

## 2015-04-24 ENCOUNTER — Encounter: Payer: Self-pay | Admitting: Internal Medicine

## 2015-04-24 ENCOUNTER — Other Ambulatory Visit: Payer: Self-pay | Admitting: Family Medicine

## 2015-04-24 ENCOUNTER — Ambulatory Visit (INDEPENDENT_AMBULATORY_CARE_PROVIDER_SITE_OTHER): Payer: BC Managed Care – PPO | Admitting: Internal Medicine

## 2015-04-24 VITALS — BP 122/68 | HR 77 | Temp 97.9°F | Resp 12 | Ht 62.0 in | Wt 235.6 lb

## 2015-04-24 DIAGNOSIS — E11319 Type 2 diabetes mellitus with unspecified diabetic retinopathy without macular edema: Secondary | ICD-10-CM

## 2015-04-24 DIAGNOSIS — E1165 Type 2 diabetes mellitus with hyperglycemia: Secondary | ICD-10-CM | POA: Diagnosis not present

## 2015-04-24 DIAGNOSIS — E113391 Type 2 diabetes mellitus with moderate nonproliferative diabetic retinopathy without macular edema, right eye: Secondary | ICD-10-CM | POA: Insufficient documentation

## 2015-04-24 MED ORDER — METFORMIN HCL ER 500 MG PO TB24: 500.0000 mg | ORAL_TABLET | Freq: Two times a day (BID) | ORAL | Status: DC

## 2015-04-24 NOTE — Patient Instructions (Signed)
Please continue Invokana.  Increase: - Lantus to 80 units at bedtime  Start Metformin ER 500 mg with dinner. If you can tolerate this well, add another tablet in am. Increase to 3x a day, with meals, if tolerate it.  Change NovoLog: - 27 units with a smaller meal - 35 units with a regular meal - 40 units with a larger meal  Let me know about your sugars in 2 weeks to see if we need to add a weekly injectable medicine.  Please return in 1.5 months with your sugar log.   PATIENT INSTRUCTIONS FOR TYPE 2 DIABETES:  **Please join MyChart!** - see attached instructions about how to join if you have not done so already.  DIET AND EXERCISE Diet and exercise is an important part of diabetic treatment.  We recommended aerobic exercise in the form of brisk walking (working between 40-60% of maximal aerobic capacity, similar to brisk walking) for 150 minutes per week (such as 30 minutes five days per week) along with 3 times per week performing 'resistance' training (using various gauge rubber tubes with handles) 5-10 exercises involving the major muscle groups (upper body, lower body and core) performing 10-15 repetitions (or near fatigue) each exercise. Start at half the above goal but build slowly to reach the above goals. If limited by weight, joint pain, or disability, we recommend daily walking in a swimming pool with water up to waist to reduce pressure from joints while allow for adequate exercise.    BLOOD GLUCOSES Monitoring your blood glucoses is important for continued management of your diabetes. Please check your blood glucoses 2-4 times a day: fasting, before meals and at bedtime (you can rotate these measurements - e.g. one day check before the 3 meals, the next day check before 2 of the meals and before bedtime, etc.).   HYPOGLYCEMIA (low blood sugar) Hypoglycemia is usually a reaction to not eating, exercising, or taking too much insulin/ other diabetes drugs.  Symptoms include  tremors, sweating, hunger, confusion, headache, etc. Treat IMMEDIATELY with 15 grams of Carbs: . 4 glucose tablets .  cup regular juice/soda . 2 tablespoons raisins . 4 teaspoons sugar . 1 tablespoon honey Recheck blood glucose in 15 mins and repeat above if still symptomatic/blood glucose <100.  RECOMMENDATIONS TO REDUCE YOUR RISK OF DIABETIC COMPLICATIONS: * Take your prescribed MEDICATION(S) * Follow a DIABETIC diet: Complex carbs, fiber rich foods, (monounsaturated and polyunsaturated) fats * AVOID saturated/trans fats, high fat foods, >2,300 mg salt per day. * EXERCISE at least 5 times a week for 30 minutes or preferably daily.  * DO NOT SMOKE OR DRINK more than 1 drink a day. * Check your FEET every day. Do not wear tightfitting shoes. Contact us if you develop an ulcer * See your EYE doctor once a year or more if needed * Get a FLU shot once a year * Get a PNEUMONIA vaccine once before and once after age 23 years  GOALS:  * Your Hemoglobin A1c of <7%  * fasting sugars need to be <130 * after meals sugars need to be <180 (2h after you start eating) * Your Systolic BP should be [id number] or lower  * Your Diastolic BP should be 80 or lower  * Your HDL (Good Cholesterol) should be 40 or higher  * Your LDL (Bad Cholesterol) should be 100 or lower. * Your Triglycerides should be 150 or lower  * Your Urine microalbumin (kidney function) should be <30 * Your Body Mass Index  should be 25 or lower    Please consider the following ways to cut down carbs and fat and increase fiber and micronutrients in your diet: - substitute whole grain for white bread or pasta - substitute brown rice for white rice - substitute 90-calorie flat bread pieces for slices of bread when possible - substitute sweet potatoes or yams for white potatoes - substitute humus for margarine - substitute tofu for cheese when possible - substitute almond or rice milk for regular milk (would not drink soy milk daily  due to concern for soy estrogen influence on breast cancer risk) - substitute dark chocolate for other sweets when possible - substitute water - can add lemon or orange slices for taste - for diet sodas (artificial sweeteners will trick your body that you can eat sweets without getting calories and will lead you to overeating and weight gain in the long run) - do not skip breakfast or other meals (this will slow down the metabolism and will result in more weight gain over time)  - can try smoothies made from fruit and almond/rice milk in am instead of regular breakfast - can also try old-fashioned (not instant) oatmeal made with almond/rice milk in am - order the dressing on the side when eating salad at a restaurant (pour less than half of the dressing on the salad) - eat as little meat as possible - can try juicing, but should not forget that juicing will get rid of the fiber, so would alternate with eating raw veg./fruits or drinking smoothies - use as little oil as possible, even when using olive oil - can dress a salad with a mix of balsamic vinegar and lemon juice, for e.g. - use agave nectar, stevia sugar, or regular sugar rather than artificial sweateners - steam or broil/roast veggies  - snack on veggies/fruit/nuts (unsalted, preferably) when possible, rather than processed foods - reduce or eliminate aspartame in diet (it is in diet sodas, chewing gum, etc) Read the labels!  Try to read Dr. Janene Harvey book: "Program for Reversing Diabetes" for other ideas for healthy eating.

## 2015-04-24 NOTE — Telephone Encounter (Signed)
Medication filled to pharmacy as requested.   

## 2015-04-24 NOTE — Progress Notes (Signed)
Patient ID: Lacey [name], female   DOB: February 08, 1951, 64 y.o.   MRN: [id number]  HPI: Lacey [name] is a 64 y.o.-year-old female, referred by her PCP, [name], for management of DM2, dx in 2000, insulin-dependent since 2000, uncontrolled, with complications (DR, PN).  Last hemoglobin A1c was: Lab Results  Component Value Date   HGBA1C 8.1* 11/15/2014   HGBA1C 7.0* 08/03/2014   HGBA1C 7.1* 04/17/2014   Pt is on a regimen of: - Invokana 100 mg daily >> added after HbA1c in 11/2014 - Lantus 68 units at bedtime - Novolog 35 units 3x a day, before meals - no missed doses She stopped Metformin >> diarrhea.  Pt checks her sugars 3-4 a day and they are: - am: 245-369 - 2h after b'fast: n/c - before lunch: 70s in the past; 196-250 - 2h after lunch: n/c - before dinner: 200-300 - 2h after dinner: n/c - bedtime: 146-196 - nighttime: n/c No lows. Lowest sugar was 70, not recently; she has hypoglycemia awareness at 70.  Highest sugar was 572.  Glucometer: One Touch   Pt's meals are: - Breakfast: egg or sausage - am snack: Pretzels or crackers - Lunch: 1/2 sandwich or salad + dessert - Dinner: meat + veggies + starch - Snacks: not after dinner No regular sodas. Walking 3x a week.  - no CKD, last BUN/creatinine:  Lab Results  Component Value Date   BUN 13 11/15/2014   CREATININE 0.76 11/15/2014  On Valsartan. - last set of lipids: Lab Results  Component Value Date   CHOL 98 08/03/2014   HDL 43.90 08/03/2014   LDLCALC 34 08/03/2014   LDLDIRECT 55.9 06/22/2011   TRIG 101.0 08/03/2014   CHOLHDL 2 08/03/2014  On Lipitor. - last eye exam was in 11/30/2014. + DR.  - + numbness and tingling in her feet. On Neurontin 300 mg at bedtime..  Pt has FH of DM in M aunts and uncles.   She has a history of HTN, HL, GERD. No h/o Pancreatitis. No FH of Medullary ThyCA.   ROS: Constitutional: + weight gain, + fatigue, + hot flashes, + excessive urination, + nocturia Eyes: +  blurry vision,  no xerophthalmia ENT: no sore throat, no nodules palpated in throat, no dysphagia/odynophagia, no hoarseness, + tinnitus Cardiovascular: + CP/+ SOB/no palpitations/+ leg swelling Respiratory: no cough/+ SOB/+ wheezing Gastrointestinal: no N/V/D/C Musculoskeletal: no muscle/joint aches Skin: no rashes, + hair loss, itching Neurological: no tremors/numbness/tingling/dizziness Psychiatric: no depression/anxiety  Past Medical History  Diagnosis Date  . Hyperlipidemia   . Hypertension   . Vitamin D deficiency   . Adenomatous colon polyp     hyperplastic  . Diverticulosis   . Diabetes mellitus, type 2 (HCC)     ORAL AND INSULIN MEDS (LAST A1C  7.3)  . Atrial tachycardia ([place]) CARDIOLOGIST - DR Caryl Comes (LAST VISIT AUG 2012)    Echo 12/11: EF 55-60%, Mild LVH, grade 1 diast dysfxn;   holter 1/12: ATach  . Neuropathy, peripheral ([place])   . Erythema CURRENT-- CLOSED ABD. WALL ABSCESS    PER PCP NOTE (03-31-11)- MRSA-- TAKES DOXYCYCLINE  . Sepsis, urinary HISTORY - 2004  . Restless leg syndrome   . Insomnia     TAKES MEDS PRN  . Arthritis     NECK, KNEES, FINGERS, TOES  . GERD (gastroesophageal reflux disease)     CONTROLLED W/ PREVACID  . Atypical chest pain     a. 07/2004 Cath: Clean cors;  b. 04/2010 Myoview: EF 63%, no ischemia  .  Anginal pain ([place])     not recent  . Pneumonia     as child  . History of kidney stones 2012  . Post op infection 11/07/12    left bunionectomy  . Obesity (BMI 30-39.9) 02/19/2015   Past Surgical History  Procedure Laterality Date  . Laparoscopic cholecystectomy  2001  . Carpal tunnel release  2000    RIGHT  . Cervical fusion  10-12-07    C5 - 7  . Right thumb joint surg.   MAY 2011  . Gastric bypass  1981  . Knee arthroscopy  AUG 2012    LEFT  . Cystoscopy/retrograde/ureteroscopy/stone extraction with basket  X2 2004 & 2009    LEFT   . Ureteral stent placement  X2  2004  &  2009    LEFT  . Percutaneous nephrostolithotomy  02-27-11    LEFT  .  Trigger finger release  2010    RIGHT THUMB  . Ureteroscopy  04/06/2011    Procedure: URETEROSCOPY;  Surgeon: Claybon Jabs, MD;  Location: [facility];  Service: Urology;  Laterality: Left;  L Ureteroscopy Laser Litho & Stent   . Cardiac catheterization  07/10/04  . Abdominal hysterectomy  1995    ovaries remain  . Left thumb joint surgery  2013  . Bunionectomy Left 10/24/12  . Cystoscopy with retrograde pyelogram, ureteroscopy and stent placement Left 11/28/2012    Procedure: LEFT RETROGRADE PYELOGRAM, LEFT URETEROSCOPY, ;  Surgeon: Claybon Jabs, MD;  Location: [facility];  Service: Urology;  Laterality: Left;   Social History   Social History  . Marital Status: Married    Spouse Name: N/A  . Number of Children: 2  . Years of Education: N/A   Occupational History  . [employer]     in office   Social History Main Topics  . Smoking status: Current Every Day Smoker -- 0.25 packs/day for 30 years    Types: Cigarettes  . Smokeless tobacco: Never Used     Comment: 1 pack per week  . Alcohol Use: No  . Drug Use: No   Other Topics Concern  . Office support   Social History Narrative   2 children, 2 stepchildren   Current Outpatient Prescriptions on File Prior to Visit  Medication Sig Dispense Refill  . aspirin EC 81 MG tablet Take 81 mg by mouth daily.    Marland Kitchen atorvastatin (LIPITOR) 20 MG tablet TAKE 1 TABLET AT BEDTIME 90 tablet 1  . B-D ULTRAFINE III SHORT PEN 31G X 8 MM MISC USE FOUR TIMES A DAY 360 each 1  . canagliflozin (INVOKANA) 100 MG TABS tablet Take 1 tablet (100 mg total) by mouth daily. 90 tablet 1  . celecoxib (CELEBREX) 200 MG capsule Take 200 mg by mouth every morning.    . citalopram (CELEXA) 20 MG tablet TAKE 1 TABLET (20 MG TOTAL) BY MOUTH DAILY. 90 tablet 1  . clonazePAM (KLONOPIN) 1 MG tablet Take 1 tablet (1 mg total) by mouth at bedtime. 1 tab nightly for restless leg. 90 tablet 1  . clotrimazole-betamethasone (LOTRISONE) cream Apply 1  application topically 2 (two) times daily. 45 g 0  . cyclobenzaprine (FLEXERIL) 10 MG tablet Take 1 tablet (10 mg total) by mouth 3 (three) times daily as needed for muscle spasms. 30 tablet 0  . diphenoxylate-atropine (LOMOTIL) 2.5-0.025 MG per tablet Take 1 tablet by mouth 4 (four) times daily as needed for diarrhea or loose stools. 30 tablet 0  .  doxycycline (VIBRA-TABS) 100 MG tablet Take 1 tablet (100 mg total) by mouth 2 (two) times daily. 20 tablet 0  . fenofibrate 160 MG tablet TAKE 1 TABLET DAILY 90 tablet 1  . furosemide (LASIX) 20 MG tablet TAKE 1 TABLET DAILY 30 tablet 5  . gabapentin (NEURONTIN) 300 MG capsule Take 1 capsule (300 mg total) by mouth at bedtime. 90 capsule 3  . glucose blood (ONETOUCH VERIO) test strip Use on e strip each time sugars are tested, pt tests 4 times daily. Dx. E11.40 200 each 12  . HYDROcodone-acetaminophen (NORCO/VICODIN) 5-325 MG per tablet Take 1 tablet by mouth 2 (two) times daily as needed.  0  . hydrocortisone valerate cream (WESTCORT) 0.2 % APPLY SPARINGLY TWICE A DAY 15 g 3  . insulin aspart (NOVOLOG FLEXPEN) 100 UNIT/ML FlexPen Inject 35 Units into the skin 3 (three) times daily with meals.    . insulin glargine (LANTUS) 100 UNIT/ML injection Inject 68 Units into the skin at bedtime.    . lansoprazole (PREVACID) 15 MG capsule Take 15 mg by mouth daily.    Marland Kitchen levothyroxine (SYNTHROID, LEVOTHROID) 50 MCG tablet TAKE 1 TABLET DAILY 90 tablet 1  . loperamide (IMODIUM) 2 MG capsule Take 2 mg by mouth 4 (four) times daily as needed for diarrhea or loose stools.    . meclizine (ANTIVERT) 25 MG tablet     . metoprolol succinate (TOPROL-XL) 50 MG 24 hr tablet TAKE AS INSTRUCTED BY YOUR PRESCRIBER 180 tablet 1  . nitrofurantoin, macrocrystal-monohydrate, (MACROBID) 100 MG capsule TAKE 1 CAPSULE TWICE A DAY FOR 7 DAYS IN CASE OF URINARY TRACT INFECTION REPEAT AS NEEDED. 56 capsule 2  . ONETOUCH DELICA LANCETS 99991111 MISC USE FOUR TIMES A DAY 360 each 6  .  oxyCODONE-acetaminophen (PERCOCET/ROXICET) 5-325 MG per tablet   0  . PREVALITE 4 G packet MIX AND DRINK 1 PACKET TWICE A DAY 180 packet 2  . promethazine (PHENERGAN) 25 MG tablet Take 1 tablet (25 mg total) by mouth every 6 (six) hours as needed for nausea or vomiting. 30 tablet 0  . valsartan (DIOVAN) 320 MG tablet Take 1 tablet (320 mg total) by mouth daily. 90 tablet 1  . Vitamin D, Ergocalciferol, (DRISDOL) 50000 UNITS CAPS capsule Take 1 capsule (50,000 Units total) by mouth every 7 (seven) days. 12 capsule 0   No current facility-administered medications on file prior to visit.   No Known Allergies Family History  Problem Relation Age of Onset  . Diabetes    . Breast cancer    . Heart disease    . Heart attack Father   . Hyperlipidemia Mother   . Hypertension Mother   . Heart disease Sister   . Hypertension Sister   . Hypertension Sister   . Hypertension Sister   . Hypertension Sister   . Cancer Sister 32    stage 4 lung cancer   PE: BP 122/68 mmHg  Pulse 77  Temp(Src) 97.9 F (36.6 C) (Oral)  Resp 12  Ht 5\' 2"  (1.575 m)  Wt 235 lb 9.6 oz (106.867 kg)  BMI 43.08 kg/m2  SpO2 95%  LMP 06/06/1993 Wt Readings from Last 3 Encounters:  04/24/15 235 lb 9.6 oz (106.867 kg)  03/01/15 239 lb 4 oz (108.523 kg)  02/19/15 237 lb 12.8 oz (107.865 kg)   Constitutional: overweight, in NAD Eyes: PERRLA, EOMI, no exophthalmos ENT: moist mucous membranes, no thyromegaly, no cervical lymphadenopathy Cardiovascular: RRR, No MRG Respiratory: CTA B Gastrointestinal: abdomen soft, NT,  ND, BS+ Musculoskeletal: no deformities, strength intact in all 4 Skin: moist, warm, no rashes Neurological: no tremor with outstretched hands, DTR normal in all 4  ASSESSMENT: 1. DM2, insulin-dependent, uncontrolled, with complications - DR - PN  PLAN:  1. Patient with long-standing, uncontrolled diabetes, on iNVOKANA and large doses of insulin (basal-bolus), which became insufficient. Her  hemoglobin A1c was controlled until 08/2014 when he started to increase and he continued to increase afterwards. HbA1c checked today >> 9.7% (higher). We traced the onset of hyperglycemia to stopping metformin. She mentions that this was causing her diarrhea. She has not tried metformin extended-release. We discussed about maybe trying this formulation of metformin, which has less GI side effects. She agrees to try it. We'll also increase Lantus to 80 units at bedtime. I will also give her more flexible NovoLog regimen. I advised her to call me back with her sugars in 2 weeks to see if we need a GLP 1 receptor agonist. We also discussed about diet and I suggested few changes. We may need a referral to nutrition at next visit. - I suggested to:  Patient Instructions  Please continue Invokana.  Increase: - Lantus to 80 units at bedtime  Start Metformin ER 500 mg with dinner. If you can tolerate this well, add another tablet in am. Increase to 3x a day, with meals, if tolerate it.  Change NovoLog: - 27 units with a smaller meal - 35 units with a regular meal - 40 units with a larger meal  Let me know about your sugars in 2 weeks to see if we need to add a weekly injectable medicine.  Please return in 1.5 months with your sugar log.   - Strongly advised her to start checking sugars at different times of the day - check 3x times a day, rotating checks - given sugar log and advised how to fill it and to bring it at next appt  - given foot care handout and explained the principles  - given instructions for hypoglycemia management "15-15 rule"  - advised for yearly eye exams >> she is up-to-date - Return to clinic in 1.5 mo with sugar log

## 2015-04-30 ENCOUNTER — Telehealth: Payer: Self-pay | Admitting: Internal Medicine

## 2015-04-30 NOTE — Telephone Encounter (Signed)
Patient called stating that per Dr. Cruzita Lederer she is to follow up in 1 week  We have no availability till Jun 26 2015  Please advise    Thank you

## 2015-04-30 NOTE — Telephone Encounter (Signed)
Her note said follow up in 1.5 months (6 weeks) ... Please call patient and schedule. Thank you!

## 2015-05-01 ENCOUNTER — Encounter: Payer: Self-pay | Admitting: Behavioral Health

## 2015-05-01 ENCOUNTER — Telehealth: Payer: Self-pay | Admitting: Behavioral Health

## 2015-05-01 NOTE — Telephone Encounter (Signed)
Pre-Visit Call completed with patient and chart updated.   Pre-Visit Info documented in Specialty Comments under [employer].    

## 2015-05-02 ENCOUNTER — Ambulatory Visit (INDEPENDENT_AMBULATORY_CARE_PROVIDER_SITE_OTHER): Payer: BC Managed Care – PPO | Admitting: Family Medicine

## 2015-05-02 ENCOUNTER — Encounter: Payer: Self-pay | Admitting: Family Medicine

## 2015-05-02 VITALS — BP 122/72 | HR 65 | Temp 97.9°F | Resp 16 | Ht 62.0 in | Wt 234.4 lb

## 2015-05-02 DIAGNOSIS — Z0000 Encounter for general adult medical examination without abnormal findings: Secondary | ICD-10-CM | POA: Diagnosis not present

## 2015-05-02 DIAGNOSIS — R7989 Other specified abnormal findings of blood chemistry: Secondary | ICD-10-CM

## 2015-05-02 LAB — LIPID PANEL
CHOLESTEROL: 148 mg/dL (ref 0–200)
HDL: 34.3 mg/dL — AB (ref >39.00)
NonHDL: 113.27
TRIGLYCERIDES: 212 mg/dL — AB (ref 0.0–149.0)
Total CHOL/HDL Ratio: 4
VLDL: 42.4 mg/dL — ABNORMAL HIGH (ref 0.0–40.0)

## 2015-05-02 LAB — CBC WITH DIFFERENTIAL/PLATELET
Basophils Absolute: 0 10*3/uL (ref 0.0–0.1)
Basophils Relative: 0.5 % (ref 0.0–3.0)
EOS ABS: 0.3 10*3/uL (ref 0.0–0.7)
Eosinophils Relative: 3.8 % (ref 0.0–5.0)
HCT: 39.7 % (ref 36.0–46.0)
Hemoglobin: 12.8 g/dL (ref 12.0–15.0)
LYMPHS PCT: 28 % (ref 12.0–46.0)
Lymphs Abs: 2.3 10*3/uL (ref 0.7–4.0)
MCHC: 32.2 g/dL (ref 30.0–36.0)
MCV: 90.6 fl (ref 78.0–100.0)
MONOS PCT: 7.5 % (ref 3.0–12.0)
Monocytes Absolute: 0.6 10*3/uL (ref 0.1–1.0)
Neutro Abs: 5 10*3/uL (ref 1.4–7.7)
Neutrophils Relative %: 60.2 % (ref 43.0–77.0)
PLATELETS: 186 10*3/uL (ref 150.0–400.0)
RBC: 4.38 Mil/uL (ref 3.87–5.11)
RDW: 14.3 % (ref 11.5–15.5)
WBC: 8.3 10*3/uL (ref 4.0–10.5)

## 2015-05-02 LAB — LDL CHOLESTEROL, DIRECT: LDL DIRECT: 86 mg/dL

## 2015-05-02 LAB — HEPATIC FUNCTION PANEL
ALT: 20 U/L (ref 0–35)
AST: 20 U/L (ref 0–37)
Albumin: 4 g/dL (ref 3.5–5.2)
Alkaline Phosphatase: 58 U/L (ref 39–117)
BILIRUBIN TOTAL: 0.4 mg/dL (ref 0.2–1.2)
Bilirubin, Direct: 0.1 mg/dL (ref 0.0–0.3)
TOTAL PROTEIN: 6.9 g/dL (ref 6.0–8.3)

## 2015-05-02 LAB — BASIC METABOLIC PANEL
BUN: 28 mg/dL — ABNORMAL HIGH (ref 6–23)
CALCIUM: 9.6 mg/dL (ref 8.4–10.5)
CO2: 28 meq/L (ref 19–32)
CREATININE: 1.21 mg/dL — AB (ref 0.40–1.20)
Chloride: 104 mEq/L (ref 96–112)
GFR: 47.47 mL/min — ABNORMAL LOW (ref >60.00)
GLUCOSE: 203 mg/dL — AB (ref 70–99)
Potassium: 5.5 mEq/L — ABNORMAL HIGH (ref 3.5–5.1)
Sodium: 139 mEq/L (ref 135–145)

## 2015-05-02 LAB — VITAMIN D 25 HYDROXY (VIT D DEFICIENCY, FRACTURES): VITD: 23.66 ng/mL — AB (ref 30.00–100.00)

## 2015-05-02 LAB — TSH: TSH: 0.42 u[IU]/mL (ref 0.35–4.50)

## 2015-05-02 MED ORDER — FLUCONAZOLE 150 MG PO TABS
150.0000 mg | ORAL_TABLET | Freq: Once | ORAL | Status: DC
Start: 2015-05-02 — End: 2015-07-12

## 2015-05-02 MED ORDER — MICONAZOLE NITRATE 2 % VA CREA: TOPICAL_CREAM | VAGINAL | Status: DC

## 2015-05-02 NOTE — Assessment & Plan Note (Signed)
Ongoing issue for pt.  Stressed the need for healthy diet and regular exercise.  Will continue to follow.

## 2015-05-02 NOTE — Progress Notes (Signed)
   Subjective:    Patient ID: Lacey [name], female    DOB: July 02, 1950, 64 y.o.   MRN: [id number]  HPI CPE- UTD on mammo, colonoscopy (due 2017), immunizations.  No need for pap due to hysterectomy.  Now seeing Dr Cruzita Lederer for DM.   Review of Systems Patient reports no vision/ hearing changes, adenopathy,fever, weight change,  persistant/recurrent hoarseness , swallowing issues, chest pain, palpitations, edema, persistant/recurrent cough, hemoptysis, dyspnea (rest/exertional/paroxysmal nocturnal), gastrointestinal bleeding (melena, rectal bleeding), abdominal pain, significant heartburn, bowel changes, GU symptoms (dysuria, hematuria, incontinence),  syncope, focal weakness, memory loss, skin/hair/nail changes, abnormal bruising or bleeding, anxiety, or depression.   + vaginitis since starting Invokana + diabetic neuropathy    Objective:   Physical Exam General Appearance:    Alert, cooperative, no distress, appears stated age, obese  Head:    Normocephalic, without obvious abnormality, atraumatic  Eyes:    PERRL, conjunctiva/corneas clear, EOM's intact, fundi    benign, both eyes  Ears:    Normal TM's and external ear canals, both ears  Nose:   Nares normal, septum midline, mucosa normal, no drainage    or sinus tenderness  Throat:   Lips, mucosa, and tongue normal; teeth and gums normal  Neck:   Supple, symmetrical, trachea midline, no adenopathy;    Thyroid: no enlargement/tenderness/nodules  Back:     Symmetric, no curvature, ROM normal, no CVA tenderness  Lungs:     Clear to auscultation bilaterally, respirations unlabored  Chest Wall:    No tenderness or deformity   Heart:    Regular rate and rhythm, S1 and S2 normal, no murmur, rub   or gallop  Breast Exam:    Deferred to mammo  Abdomen:     Soft, non-tender, bowel sounds active all four quadrants,    no masses, no organomegaly  Genitalia:    Deferred at pt's request  Rectal:    Extremities:   Extremities normal, atraumatic, no  cyanosis or edema  Pulses:   2+ and symmetric all extremities  Skin:   Skin color, texture, turgor normal, no rashes or lesions  Lymph nodes:   Cervical, supraclavicular, and axillary nodes normal  Neurologic:   CNII-XII intact, normal strength, sensation and reflexes    throughout          Assessment & Plan:

## 2015-05-02 NOTE — Progress Notes (Signed)
Pre visit review using our clinic review tool, if applicable. No additional management support is needed unless otherwise documented below in the visit note. 

## 2015-05-02 NOTE — Patient Instructions (Signed)
Follow up in 6 months to recheck BP and cholesterol We'll notify you of your lab results and make any changes if needed You are due for repeat colonoscopy in 2017- you should receive a letter to schedule Continue to work on healthy diet and regular exercise- you can do it! Call with any questions or concerns If you want to join [place] at the new [place] office, any scheduled appointments will automatically transfer and we will see you at [address] [place] [address], [place], [place] [phone] ([place]) Happy New Year!!!

## 2015-05-02 NOTE — Assessment & Plan Note (Signed)
Pt's PE unchanged from previous.  UTD on mammo, due for repeat colonoscopy 2017.  No need for paps.  UTD on immunizations.  UTD on DM appts w/ Dr Cruzita Lederer.  Check labs.  Anticipatory guidance provided.

## 2015-05-03 ENCOUNTER — Other Ambulatory Visit: Payer: Self-pay | Admitting: General Practice

## 2015-05-03 DIAGNOSIS — E875 Hyperkalemia: Secondary | ICD-10-CM

## 2015-05-03 MED ORDER — VITAMIN D (ERGOCALCIFEROL) 1.25 MG (50000 UNIT) PO CAPS
50000.0000 [IU] | ORAL_CAPSULE | ORAL | Status: DC
Start: 2015-05-03 — End: 2015-06-27

## 2015-05-20 ENCOUNTER — Encounter: Payer: Self-pay | Admitting: Family Medicine

## 2015-05-28 ENCOUNTER — Encounter: Payer: Self-pay | Admitting: Family Medicine

## 2015-05-28 ENCOUNTER — Ambulatory Visit (INDEPENDENT_AMBULATORY_CARE_PROVIDER_SITE_OTHER): Payer: BC Managed Care – PPO | Admitting: Family Medicine

## 2015-05-28 VITALS — BP 140/70 | HR 78 | Temp 98.1°F | Ht 62.0 in | Wt 231.0 lb

## 2015-05-28 DIAGNOSIS — H66002 Acute suppurative otitis media without spontaneous rupture of ear drum, left ear: Secondary | ICD-10-CM | POA: Diagnosis not present

## 2015-05-28 MED ORDER — AMOXICILLIN 875 MG PO TABS
875.0000 mg | ORAL_TABLET | Freq: Two times a day (BID) | ORAL | Status: DC
Start: 2015-05-28 — End: 2015-07-12

## 2015-05-28 NOTE — Progress Notes (Signed)
Pre visit review using our clinic review tool, if applicable. No additional management support is needed unless otherwise documented below in the visit note. 

## 2015-05-28 NOTE — Progress Notes (Signed)
   Subjective:    Patient ID: Lacey [name], female    DOB: 06/10/1950, 65 y.o.   MRN: [id number]  HPI Ear pain- 'i'm not feeling up to par'.  + laryngitis, sore throat.  Ear pain is bilateral.  Denies sinus pain/pressure.  + HA.  + nasal congestion.  Son-in-law and sister have both passed away within the week.  No fevers.  + cough- productive.  Some SOB.   Review of Systems For ROS see HPI     Objective:   Physical Exam  Constitutional: She is oriented to person, place, and time. She appears well-developed and well-nourished. No distress.  HENT:  Head: Normocephalic and atraumatic.  Nose: Nose normal.  Mouth/Throat: Oropharynx is clear and moist. No oropharyngeal exudate.  L TM dull, erythematous w/ visible fluid R TM dull, less erythematous, no fluid present No TTP over sinuses  Eyes: Conjunctivae and EOM are normal. Pupils are equal, round, and reactive to light.  Neck: Normal range of motion. Neck supple.  Cardiovascular: Normal rate, regular rhythm and normal heart sounds.   Pulmonary/Chest: Effort normal and breath sounds normal. No respiratory distress. She has no wheezes. She has no rales.  Lymphadenopathy:    She has no cervical adenopathy.  Neurological: She is alert and oriented to person, place, and time.  Skin: Skin is warm and dry.  Vitals reviewed.         Assessment & Plan:

## 2015-05-28 NOTE — Patient Instructions (Signed)
Follow up as needed Start the Amoxicillin twice daily for the ear infection- take w/ food Invokana substitutes are Jardiance, Farxiga Lantus substitutes are Levemir, Toujeo, Tyler Aas Mucinex DM for cough and congestion REST! Call with any questions or concerns Hang in there! You can do this!!!

## 2015-05-28 NOTE — Assessment & Plan Note (Signed)
Pt's sxs and PE consistent w/ infxn.  Start abx.  Reviewed supportive care and red flags that should prompt return.  Pt expressed understanding and is in agreement w/ plan.  

## 2015-05-29 ENCOUNTER — Encounter: Payer: Self-pay | Admitting: Family Medicine

## 2015-06-03 MED ORDER — EMPAGLIFLOZIN 10 MG PO TABS: 10.0000 mg | ORAL_TABLET | Freq: Every day | ORAL | Status: DC

## 2015-06-03 MED ORDER — INSULIN DEGLUDEC 200 UNIT/ML ~~LOC~~ SOPN: 80.0000 [IU] | PEN_INJECTOR | Freq: Every day | SUBCUTANEOUS | Status: DC

## 2015-06-15 ENCOUNTER — Other Ambulatory Visit: Payer: Self-pay | Admitting: Internal Medicine

## 2015-06-20 ENCOUNTER — Telehealth: Payer: Self-pay | Admitting: Family Medicine

## 2015-06-20 MED ORDER — HYDROCORTISONE VALERATE 0.2 % EX CREA: TOPICAL_CREAM | CUTANEOUS | Status: DC

## 2015-06-20 NOTE — Telephone Encounter (Signed)
Ok for refill? 

## 2015-06-20 NOTE — Telephone Encounter (Signed)
Medication filled to pharmacy as requested.   

## 2015-06-27 ENCOUNTER — Other Ambulatory Visit: Payer: Self-pay | Admitting: *Deleted

## 2015-06-27 ENCOUNTER — Other Ambulatory Visit: Payer: Self-pay

## 2015-06-27 MED ORDER — METFORMIN HCL ER 500 MG PO TB24: 500.0000 mg | ORAL_TABLET | Freq: Three times a day (TID) | ORAL | Status: DC

## 2015-06-27 MED ORDER — VITAMIN D (ERGOCALCIFEROL) 1.25 MG (50000 UNIT) PO CAPS: 50000.0000 [IU] | ORAL_CAPSULE | ORAL | Status: DC

## 2015-06-28 ENCOUNTER — Encounter: Payer: Self-pay | Admitting: Family Medicine

## 2015-06-28 DIAGNOSIS — Z720 Tobacco use: Secondary | ICD-10-CM

## 2015-06-28 DIAGNOSIS — Z801 Family history of malignant neoplasm of trachea, bronchus and lung: Secondary | ICD-10-CM

## 2015-06-28 NOTE — Telephone Encounter (Signed)
Referral placed.

## 2015-07-01 ENCOUNTER — Telehealth: Payer: Self-pay | Admitting: General Practice

## 2015-07-01 DIAGNOSIS — L918 Other hypertrophic disorders of the skin: Secondary | ICD-10-CM | POA: Diagnosis not present

## 2015-07-01 DIAGNOSIS — L820 Inflamed seborrheic keratosis: Secondary | ICD-10-CM | POA: Diagnosis not present

## 2015-07-01 DIAGNOSIS — D225 Melanocytic nevi of trunk: Secondary | ICD-10-CM | POA: Diagnosis not present

## 2015-07-01 MED ORDER — TRAZODONE HCL 50 MG PO TABS: ORAL_TABLET | ORAL | Status: DC

## 2015-07-01 MED ORDER — VITAMIN D (ERGOCALCIFEROL) 1.25 MG (50000 UNIT) PO CAPS: 50000.0000 [IU] | ORAL_CAPSULE | ORAL | Status: DC

## 2015-07-01 MED ORDER — HYDROCORTISONE VALERATE 0.2 % EX CREA: TOPICAL_CREAM | CUTANEOUS | Status: DC

## 2015-07-01 MED ORDER — INSULIN PEN NEEDLE 31G X 8 MM MISC: Status: DC

## 2015-07-01 MED ORDER — LEVOTHYROXINE SODIUM 50 MCG PO TABS: 50.0000 ug | ORAL_TABLET | Freq: Every day | ORAL | Status: DC

## 2015-07-01 MED ORDER — GABAPENTIN 300 MG PO CAPS: 300.0000 mg | ORAL_CAPSULE | Freq: Every day | ORAL | Status: DC

## 2015-07-01 NOTE — Telephone Encounter (Signed)
Medication filled to pharmacy as requested.   

## 2015-07-03 ENCOUNTER — Other Ambulatory Visit: Payer: Self-pay

## 2015-07-03 MED ORDER — METOPROLOL SUCCINATE ER 50 MG PO TB24: ORAL_TABLET | ORAL | Status: DC

## 2015-07-09 ENCOUNTER — Encounter: Payer: Self-pay | Admitting: Internal Medicine

## 2015-07-12 ENCOUNTER — Telehealth: Payer: Self-pay | Admitting: Family Medicine

## 2015-07-12 ENCOUNTER — Telehealth: Payer: Self-pay | Admitting: Acute Care

## 2015-07-12 ENCOUNTER — Other Ambulatory Visit: Payer: Self-pay | Admitting: Acute Care

## 2015-07-12 ENCOUNTER — Encounter: Payer: Self-pay | Admitting: Acute Care

## 2015-07-12 ENCOUNTER — Ambulatory Visit (INDEPENDENT_AMBULATORY_CARE_PROVIDER_SITE_OTHER): Payer: Medicare Other | Admitting: Family Medicine

## 2015-07-12 ENCOUNTER — Encounter: Payer: Self-pay | Admitting: Family Medicine

## 2015-07-12 VITALS — BP 140/78 | HR 69 | Temp 98.1°F | Resp 16 | Wt 235.4 lb

## 2015-07-12 DIAGNOSIS — M545 Low back pain: Secondary | ICD-10-CM

## 2015-07-12 DIAGNOSIS — F17210 Nicotine dependence, cigarettes, uncomplicated: Principal | ICD-10-CM

## 2015-07-12 DIAGNOSIS — M5410 Radiculopathy, site unspecified: Secondary | ICD-10-CM

## 2015-07-12 LAB — POCT URINALYSIS DIPSTICK
Bilirubin, UA: NEGATIVE
Blood, UA: NEGATIVE
Ketones, UA: NEGATIVE
LEUKOCYTES UA: NEGATIVE
Nitrite, UA: NEGATIVE
Protein, UA: NEGATIVE
SPEC GRAV UA: 1.02
UROBILINOGEN UA: 0.2
pH, UA: 6

## 2015-07-12 MED ORDER — MELOXICAM 15 MG PO TABS: 15.0000 mg | ORAL_TABLET | Freq: Every day | ORAL | Status: DC

## 2015-07-12 MED ORDER — CYCLOBENZAPRINE HCL 10 MG PO TABS: 10.0000 mg | ORAL_TABLET | Freq: Three times a day (TID) | ORAL | Status: DC | PRN

## 2015-07-12 NOTE — Telephone Encounter (Signed)
Pt has an appt today with [name] at 2:45pm.

## 2015-07-12 NOTE — Telephone Encounter (Signed)
[facility] - Client [facility]  Patient Name: Lacey [name]  DOB: 1950-07-29    Initial Comment Caller States she is pain in her left side near her kidney, it is going down into her leg   Nurse Assessment  Nurse: Wynetta Emery, RN, Baker Janus Date/Time (Eastern Time): 07/12/2015 8:30:18 AM  Confirm and document reason for call. If symptomatic, describe symptoms. You must click the next button to save text entered. ---Jannete is having pain that is over left kidney urinated scantly thinks kidney stone pain radiating down outside of left leg. onset two days  Has the patient traveled out of the country within the last 30 days? ---No  Does the patient have any new or worsening symptoms? ---Yes  Will a triage be completed? ---Yes  Related visit to physician within the last 2 weeks? ---No  Does the PT have any chronic conditions? (i.e. diabetes, asthma, etc.) ---Yes  List chronic conditions. ---History of kidney stones  Is this a behavioral health or substance abuse call? ---No     Guidelines    Guideline Title Affirmed Question Affirmed Notes  Flank Pain MODERATE pain (e.g., interferes with normal activities or awakens from sleep)    Final Disposition User   See Physician within [place], RN, [facility]    Referrals  REFERRED TO PCP OFFICE   Disagree/Comply: Leta Baptist

## 2015-07-12 NOTE — Progress Notes (Signed)
Pre visit review using our clinic review tool, if applicable. No additional management support is needed unless otherwise documented below in the visit note. 

## 2015-07-12 NOTE — Patient Instructions (Signed)
Follow up as needed/scheduled Start the Mobic once daily- take w/ food Use the flexeril as needed Heating pad for pain relief Continue the hydrocodone as needed for severe pain Gentle stretching to avoid stiffness If you want to join [place] at the new [place] office, any scheduled appointments will automatically transfer and we will see you at [address] [place] [address] [place], [place] [address] ([place] 3/23) Call with any questions or concerns Hang in there!!!

## 2015-07-12 NOTE — Progress Notes (Signed)
   Subjective:    Patient ID: Lacey [name], female    DOB: 01-09-1951, 65 y.o.   MRN: [id number]  HPI L back pain- sxs started Tuesday 'in L kidney'.  Pt reports pain is stabbing and radiates down back, into buttock and L thigh.  Decreased urination but increased frequency.  Hx of kidney stones.  No fevers.  No pain w/ urination.  No relief w/ hydrocodone 5/325mg .  Pt reports increased fluid intake.   Review of Systems For ROS see HPI     Objective:   Physical Exam  Constitutional: She is oriented to person, place, and time. She appears well-developed and well-nourished. No distress.  HENT:  Head: Normocephalic and atraumatic.  Cardiovascular: Intact distal pulses.   Abdominal: Soft. There is no tenderness (no suprapubic or CVA tenderness). There is no rebound and no guarding.  Musculoskeletal: She exhibits tenderness (TTP over L paraspinal muscles). She exhibits no edema.  Neurological: She is alert and oriented to person, place, and time. She has normal reflexes. No cranial nerve deficit. Coordination normal.  + SLR on L, (-) on R  Psychiatric: She has a normal mood and affect. Her behavior is normal. Thought content normal.  Vitals reviewed.         Assessment & Plan:

## 2015-07-12 NOTE — Telephone Encounter (Signed)
Shared decision making visit scheduled for 3.21.17

## 2015-07-14 NOTE — Assessment & Plan Note (Signed)
New.  Pt's UA is not consistent w/ stone despite hx of this.  sxs are more consistent w/ musculoskeletal etiology- supported by + SLR on L.  Start scheduled NSAIDs- avoid prednisone due to DM.  Start muscle relaxer.  Reviewed supportive care and red flags that should prompt return.  Pt expressed understanding and is in agreement w/ plan.

## 2015-07-15 ENCOUNTER — Encounter: Payer: Self-pay | Admitting: Family Medicine

## 2015-07-15 ENCOUNTER — Encounter: Payer: Self-pay | Admitting: Internal Medicine

## 2015-07-15 DIAGNOSIS — M5410 Radiculopathy, site unspecified: Secondary | ICD-10-CM

## 2015-07-15 NOTE — Telephone Encounter (Signed)
Referral placed.

## 2015-07-17 NOTE — Telephone Encounter (Signed)
Noted  

## 2015-07-19 DIAGNOSIS — M5416 Radiculopathy, lumbar region: Secondary | ICD-10-CM | POA: Diagnosis not present

## 2015-07-23 ENCOUNTER — Encounter: Payer: Self-pay | Admitting: Acute Care

## 2015-07-23 ENCOUNTER — Ambulatory Visit (INDEPENDENT_AMBULATORY_CARE_PROVIDER_SITE_OTHER)
Admission: RE | Admit: 2015-07-23 | Discharge: 2015-07-23 | Disposition: A | Discharge status: Home / Self Care | Payer: Medicare Other | Source: Ambulatory Visit | Attending: Acute Care | Admitting: Acute Care

## 2015-07-23 ENCOUNTER — Ambulatory Visit (INDEPENDENT_AMBULATORY_CARE_PROVIDER_SITE_OTHER): Payer: Medicare Other | Admitting: Acute Care

## 2015-07-23 DIAGNOSIS — F17210 Nicotine dependence, cigarettes, uncomplicated: Secondary | ICD-10-CM

## 2015-07-23 DIAGNOSIS — Z87891 Personal history of nicotine dependence: Secondary | ICD-10-CM | POA: Diagnosis not present

## 2015-07-23 NOTE — Progress Notes (Signed)
Shared Decision Making Visit Lung Cancer Screening Program [id number])   Eligibility:  Age 65 y.o.  Pack Years Smoking History Calculation 32.5 pack years (# packs/per year x # years smoked)  Recent History of coughing up blood  no  Unexplained weight loss? no ( >Than 15 pounds within the last 6 months )  Prior History Lung / other cancer no (Diagnosis within the last 5 years already requiring surveillance chest CT Scans).  Smoking Status Current Smoker  Former Smokers: Years since quit:NA Current Smoker  Quit Date: NA  Visit Components:  Discussion included one or more decision making aids. yes  Discussion included risk/benefits of screening. yes  Discussion included potential follow up diagnostic testing for abnormal scans. yes  Discussion included meaning and risk of over diagnosis. yes  Discussion included meaning and risk of False Positives. yes  Discussion included meaning of total radiation exposure. yes  Counseling Included:  Importance of adherence to annual lung cancer LDCT screening. yes  Impact of comorbidities on ability to participate in the program. yes  Ability and willingness to under diagnostic treatment. yes  Smoking Cessation Counseling:  Current Smokers:   Discussed importance of smoking cessation. yes  Information about tobacco cessation classes and interventions provided to patient. yes  Patient provided with "ticket" for LDCT Scan. yes  Symptomatic Patient. no  Counseling  Diagnosis Code: Tobacco Use Z72.0  Asymptomatic Patient: yes  Counseling (Intermediate counseling: > three minutes counseling) [id number]  Former Smokers:   Discussed the importance of maintaining cigarette abstinence: NA, Current Smoker  Diagnosis Code: Personal History of Nicotine Dependence. [id number]  Information about tobacco cessation classes and interventions provided to patient. Yes  Patient provided with "ticket" for LDCT Scan. yes  Written Order for  Lung Cancer Screening with LDCT placed in Epic. Yes (CT Chest Lung Cancer Screening Low Dose W/O CM) [id number] Z12.2-Screening of respiratory organs Z87.891-Personal history of nicotine dependence  I have spent 15 minutes of face to face time with Mrs. Mcnalley discussing the risks and benefits of lung cancer screening. We viewed a power point together that explained in detail the above noted topics. We paused at intervals to allow for questions to be asked and answered to ensure understanding.We discussed that the single most powerful action that she can take to decrease her risk of developing lung cancer is to quit smoking. We discussed whether or not she is ready to commit to setting a quit date. She is currently not ready to set a quit date. We discussed options for tools to aid in quitting smoking including nicotine replacement therapy, non-nicotine medications, support groups, Quit Smart classes, and behavior modification. We discussed that often times setting smaller, more achievable goals, such as eliminating 1 cigarette a day for a week and then 2 cigarettes a day for a week can be helpful in slowly decreasing the number of cigarettes smoked. This allows for a sense of accomplishment as well as providing a clinical benefit. I gave Mrs. Berrocal the " Be Stronger Than Your Excuses" card with contact information for community resources, classes, free nicotine replacement therapy, and access to mobile apps, text messaging, and on-line smoking cessation help. I have also given her my card and contact information in the event she needs to contact me. We discussed the time and location of the scan, and that either June Leap, CMA, or I will call with the results within 24-48 hours of receiving them. I have provided her with a copy of the power point we  viewed  as a resource in the event they need reinforcement of the concepts we discussed today in the office. The patient verbalized understanding of all of  the  above and had no further questions upon leaving the office. They have my contact information in the event they have any further questions.   Magdalen Spatz, NP  07/23/2015

## 2015-07-25 DIAGNOSIS — M5136 Other intervertebral disc degeneration, lumbar region: Secondary | ICD-10-CM | POA: Diagnosis not present

## 2015-07-25 DIAGNOSIS — M5416 Radiculopathy, lumbar region: Secondary | ICD-10-CM | POA: Diagnosis not present

## 2015-07-25 DIAGNOSIS — M5442 Lumbago with sciatica, left side: Secondary | ICD-10-CM | POA: Diagnosis not present

## 2015-08-03 DIAGNOSIS — M545 Low back pain: Secondary | ICD-10-CM | POA: Diagnosis not present

## 2015-08-07 DIAGNOSIS — M5136 Other intervertebral disc degeneration, lumbar region: Secondary | ICD-10-CM | POA: Diagnosis not present

## 2015-08-07 DIAGNOSIS — M5416 Radiculopathy, lumbar region: Secondary | ICD-10-CM | POA: Diagnosis not present

## 2015-08-07 DIAGNOSIS — M5442 Lumbago with sciatica, left side: Secondary | ICD-10-CM | POA: Diagnosis not present

## 2015-08-08 DIAGNOSIS — N261 Atrophy of kidney (terminal): Secondary | ICD-10-CM | POA: Diagnosis not present

## 2015-08-08 DIAGNOSIS — N200 Calculus of kidney: Secondary | ICD-10-CM | POA: Diagnosis not present

## 2015-08-08 DIAGNOSIS — Z0000 Encounter for general adult medical examination without abnormal findings: Secondary | ICD-10-CM | POA: Diagnosis not present

## 2015-08-12 ENCOUNTER — Encounter: Payer: Self-pay | Admitting: Family Medicine

## 2015-08-12 MED ORDER — CLONAZEPAM 1 MG PO TABS: 1.0000 mg | ORAL_TABLET | Freq: Every day | ORAL | Status: DC

## 2015-08-12 NOTE — Telephone Encounter (Signed)
CSC on file, needs UDS

## 2015-08-12 NOTE — Telephone Encounter (Signed)
Please advise on the pt receiving injections.   Clonazepam last filled 01/21/15 #90 with 1 Last OV 07/12/15

## 2015-08-19 ENCOUNTER — Encounter: Payer: Self-pay | Admitting: Family Medicine

## 2015-08-19 MED ORDER — CITALOPRAM HYDROBROMIDE 20 MG PO TABS: ORAL_TABLET | ORAL | Status: DC

## 2015-08-19 MED ORDER — VALSARTAN 320 MG PO TABS: 320.0000 mg | ORAL_TABLET | Freq: Every day | ORAL | Status: DC

## 2015-08-19 NOTE — Telephone Encounter (Signed)
Medication filled to pharmacy as requested.   

## 2015-08-22 ENCOUNTER — Other Ambulatory Visit: Payer: Self-pay | Admitting: Family Medicine

## 2015-08-22 NOTE — Telephone Encounter (Signed)
Medication filled to pharmacy as requested.   

## 2015-09-02 ENCOUNTER — Encounter: Payer: Self-pay | Admitting: Family Medicine

## 2015-09-02 ENCOUNTER — Other Ambulatory Visit: Payer: Self-pay | Admitting: Family Medicine

## 2015-09-02 ENCOUNTER — Ambulatory Visit (INDEPENDENT_AMBULATORY_CARE_PROVIDER_SITE_OTHER): Payer: Medicare Other | Admitting: Family Medicine

## 2015-09-02 VITALS — BP 102/68 | HR 68 | Temp 98.4°F | Resp 17 | Ht 62.0 in | Wt 225.4 lb

## 2015-09-02 DIAGNOSIS — R509 Fever, unspecified: Secondary | ICD-10-CM

## 2015-09-02 DIAGNOSIS — I951 Orthostatic hypotension: Secondary | ICD-10-CM | POA: Diagnosis not present

## 2015-09-02 DIAGNOSIS — R945 Abnormal results of liver function studies: Secondary | ICD-10-CM

## 2015-09-02 DIAGNOSIS — R7989 Other specified abnormal findings of blood chemistry: Secondary | ICD-10-CM

## 2015-09-02 LAB — BASIC METABOLIC PANEL
BUN: 27 mg/dL — ABNORMAL HIGH (ref 6–23)
CALCIUM: 9.6 mg/dL (ref 8.4–10.5)
CO2: 24 meq/L (ref 19–32)
CREATININE: 1.17 mg/dL (ref 0.40–1.20)
Chloride: 103 mEq/L (ref 96–112)
GFR: 49.29 mL/min — AB (ref >60.00)
GLUCOSE: 173 mg/dL — AB (ref 70–99)
Potassium: 4.3 mEq/L (ref 3.5–5.1)
SODIUM: 136 meq/L (ref 135–145)

## 2015-09-02 LAB — HEPATIC FUNCTION PANEL
ALBUMIN: 3.8 g/dL (ref 3.5–5.2)
ALT: 52 U/L — ABNORMAL HIGH (ref 0–35)
AST: 30 U/L (ref 0–37)
Alkaline Phosphatase: 98 U/L (ref 39–117)
Bilirubin, Direct: 0.1 mg/dL (ref 0.0–0.3)
Total Bilirubin: 0.4 mg/dL (ref 0.2–1.2)
Total Protein: 7.4 g/dL (ref 6.0–8.3)

## 2015-09-02 LAB — CBC WITH DIFFERENTIAL/PLATELET
BASOS ABS: 0 10*3/uL (ref 0.0–0.1)
Basophils Relative: 0.4 % (ref 0.0–3.0)
Eosinophils Absolute: 0.2 10*3/uL (ref 0.0–0.7)
Eosinophils Relative: 2 % (ref 0.0–5.0)
HEMATOCRIT: 39.7 % (ref 36.0–46.0)
HEMOGLOBIN: 13.2 g/dL (ref 12.0–15.0)
LYMPHS PCT: 15.1 % (ref 12.0–46.0)
Lymphs Abs: 1.4 10*3/uL (ref 0.7–4.0)
MCHC: 33.2 g/dL (ref 30.0–36.0)
MCV: 88.7 fl (ref 78.0–100.0)
MONOS PCT: 12 % (ref 3.0–12.0)
Monocytes Absolute: 1.1 10*3/uL — ABNORMAL HIGH (ref 0.1–1.0)
NEUTROS PCT: 70.5 % (ref 43.0–77.0)
Neutro Abs: 6.6 10*3/uL (ref 1.4–7.7)
Platelets: 176 10*3/uL (ref 150.0–400.0)
RBC: 4.48 Mil/uL (ref 3.87–5.11)
RDW: 15 % (ref 11.5–15.5)
WBC: 9.4 10*3/uL (ref 4.0–10.5)

## 2015-09-02 LAB — POCT INFLUENZA A/B
Influenza A, POC: NEGATIVE
Influenza B, POC: NEGATIVE

## 2015-09-02 MED ORDER — ONDANSETRON HCL 4 MG PO TABS: 4.0000 mg | ORAL_TABLET | Freq: Three times a day (TID) | ORAL | Status: DC | PRN

## 2015-09-02 NOTE — Progress Notes (Signed)
Pre visit review using our clinic review tool, if applicable. No additional management support is needed unless otherwise documented below in the visit note. 

## 2015-09-02 NOTE — Assessment & Plan Note (Signed)
During this illness, pt's BP has been quite low.  Based on this, will decrease Metoprolol dose to once daily.  If her BP remains low, she is to contact me and we will then hold the Valsartan.  Encouraged increased fluids and changing position slowly.  Will follow.

## 2015-09-02 NOTE — Progress Notes (Signed)
   Subjective:    Patient ID: Lacey [name], female    DOB: 1951-01-07, 65 y.o.   MRN: [id number]  HPI Nausea/fever- sxs started 'late Friday evening' w/ nausea.  No actual vomiting.  + chills.  Saturday developed fever and labile BP- lowest BP was 70/44.  Was not able to go to church yesterday b/c 'everything hurt- even my hair hurt'.   Nausea has improved.  No diarrhea.  Intermittent dizziness.  Pt reports CBGs are 'good'.  Currently taking metoprolol 1 twice daily.  Also on Valsartan.   Review of Systems For ROS see HPI     Objective:   Physical Exam  Constitutional: She is oriented to person, place, and time. She appears well-developed and well-nourished. No distress.  Obviously not feeling well  HENT:  Head: Normocephalic and atraumatic.  Right Ear: Tympanic membrane normal.  Left Ear: Tympanic membrane normal.  Nose: Mucosal edema and rhinorrhea present. Right sinus exhibits no maxillary sinus tenderness and no frontal sinus tenderness. Left sinus exhibits no maxillary sinus tenderness and no frontal sinus tenderness.  Mouth/Throat: Uvula is midline and mucous membranes are normal. Posterior oropharyngeal erythema present. No oropharyngeal exudate.  Eyes: Conjunctivae and EOM are normal. Pupils are equal, round, and reactive to light.  Neck: Normal range of motion. Neck supple.  Cardiovascular: Normal rate, regular rhythm and normal heart sounds.   Pulmonary/Chest: Effort normal and breath sounds normal. No respiratory distress. She has no wheezes.  Abdominal: Soft. Bowel sounds are normal. She exhibits no distension. There is no tenderness. There is no rebound and no guarding.  Musculoskeletal: She exhibits no edema.  Lymphadenopathy:    She has no cervical adenopathy.  Neurological: She is alert and oriented to person, place, and time.  Skin: Skin is warm.  Vitals reviewed.         Assessment & Plan:

## 2015-09-02 NOTE — Patient Instructions (Signed)
Follow up as needed We'll notify you of your lab results and make any changes if needed Use the Zofran as needed for nausea Drink plenty of fluids Decrease the metoprolol to once daily until your blood pressure comes up and then you can add back the 2nd dose REST!!! Call with any questions or concerns Hang in there!!!

## 2015-09-02 NOTE — Assessment & Plan Note (Signed)
New.  Pt's temp was up to 101.  + nausea, body aches, chills w/o other focal sxs- minimal cough, no sinus pain/pressure, no abd pain.  Suspect a flu like illness- flu test in office negative.  Check labs to r/o infxn, check sugar and assess other electrolytes in setting of diabetes.  Reviewed supportive care and red flags that should prompt return.  Pt expressed understanding and is in agreement w/ plan.

## 2015-09-04 ENCOUNTER — Other Ambulatory Visit: Payer: Self-pay | Admitting: General Practice

## 2015-09-04 MED ORDER — MELOXICAM 15 MG PO TABS
15.0000 mg | ORAL_TABLET | Freq: Every day | ORAL | Status: DC
Start: 2015-09-04 — End: 2015-09-06

## 2015-09-06 ENCOUNTER — Ambulatory Visit (AMBULATORY_SURGERY_CENTER): Payer: Self-pay | Admitting: *Deleted

## 2015-09-06 VITALS — Ht 62.0 in | Wt 226.0 lb

## 2015-09-06 DIAGNOSIS — Z86010 Personal history of colonic polyps: Secondary | ICD-10-CM

## 2015-09-06 MED ORDER — NA SULFATE-K SULFATE-MG SULF 17.5-3.13-1.6 GM/177ML PO SOLN: 1.0000 | Freq: Once | ORAL | Status: DC

## 2015-09-06 NOTE — Progress Notes (Signed)
No egg or soy allergy known to patient  No issues with past sedation with any surgeries  or procedures, no intubation problems  No diet pills per patient No home 02 use per patient  No blood thinners per patient  Pt denies issues with constipation  emmi declined

## 2015-09-13 ENCOUNTER — Other Ambulatory Visit: Payer: Self-pay | Admitting: Family Medicine

## 2015-09-13 NOTE — Telephone Encounter (Signed)
Medication filled to pharmacy as requested.   

## 2015-09-16 ENCOUNTER — Other Ambulatory Visit (INDEPENDENT_AMBULATORY_CARE_PROVIDER_SITE_OTHER): Payer: Medicare Other

## 2015-09-16 DIAGNOSIS — R7989 Other specified abnormal findings of blood chemistry: Secondary | ICD-10-CM | POA: Diagnosis not present

## 2015-09-16 DIAGNOSIS — R945 Abnormal results of liver function studies: Secondary | ICD-10-CM

## 2015-09-16 LAB — HEPATIC FUNCTION PANEL
ALT: 15 U/L (ref 0–35)
AST: 15 U/L (ref 0–37)
Albumin: 4 g/dL (ref 3.5–5.2)
Alkaline Phosphatase: 82 U/L (ref 39–117)
BILIRUBIN TOTAL: 0.3 mg/dL (ref 0.2–1.2)
Bilirubin, Direct: 0.1 mg/dL (ref 0.0–0.3)
TOTAL PROTEIN: 7.1 g/dL (ref 6.0–8.3)

## 2015-09-20 ENCOUNTER — Encounter: Payer: Self-pay | Admitting: Internal Medicine

## 2015-09-20 ENCOUNTER — Ambulatory Visit (AMBULATORY_SURGERY_CENTER): Payer: Medicare Other | Admitting: Internal Medicine

## 2015-09-20 VITALS — BP 119/47 | HR 67 | Temp 98.9°F | Resp 17 | Ht 62.0 in | Wt 226.0 lb

## 2015-09-20 DIAGNOSIS — D122 Benign neoplasm of ascending colon: Secondary | ICD-10-CM | POA: Diagnosis not present

## 2015-09-20 DIAGNOSIS — Z86010 Personal history of colonic polyps: Secondary | ICD-10-CM | POA: Diagnosis not present

## 2015-09-20 DIAGNOSIS — Z1211 Encounter for screening for malignant neoplasm of colon: Secondary | ICD-10-CM | POA: Diagnosis not present

## 2015-09-20 DIAGNOSIS — I2510 Atherosclerotic heart disease of native coronary artery without angina pectoris: Secondary | ICD-10-CM | POA: Diagnosis not present

## 2015-09-20 LAB — GLUCOSE, CAPILLARY
GLUCOSE-CAPILLARY: 127 mg/dL — AB (ref 65–99)
Glucose-Capillary: 152 mg/dL — ABNORMAL HIGH (ref 65–99)

## 2015-09-20 MED ORDER — SODIUM CHLORIDE 0.9 % IV SOLN: 500.0000 mL | INTRAVENOUS | Status: DC

## 2015-09-20 NOTE — Progress Notes (Signed)
Called to room to assist during endoscopic procedure.  Patient ID and intended procedure confirmed with present staff. Received instructions for my participation in the procedure from the performing physician.  

## 2015-09-20 NOTE — Progress Notes (Signed)
A/ox3 pleased with MAC, report to Penny RN 

## 2015-09-20 NOTE — Patient Instructions (Signed)
YOU HAD AN ENDOSCOPIC PROCEDURE TODAY AT [facility]:   Refer to the procedure report that was given to you for any specific questions about what was found during the examination.  If the procedure report does not answer your questions, please call your gastroenterologist to clarify.  If you requested that your care partner not be given the details of your procedure findings, then the procedure report has been included in a sealed envelope for you to review at your convenience later.  YOU SHOULD EXPECT: Some feelings of bloating in the abdomen. Passage of more gas than usual.  Walking can help get rid of the air that was put into your GI tract during the procedure and reduce the bloating. If you had a lower endoscopy (such as a colonoscopy or flexible sigmoidoscopy) you may notice spotting of blood in your stool or on the toilet paper. If you underwent a bowel prep for your procedure, you may not have a normal bowel movement for a few days.  Please Note:  You might notice some irritation and congestion in your nose or some drainage.  This is from the oxygen used during your procedure.  There is no need for concern and it should clear up in a day or so.  SYMPTOMS TO REPORT IMMEDIATELY:   Following lower endoscopy (colonoscopy or flexible sigmoidoscopy):  Excessive amounts of blood in the stool  Significant tenderness or worsening of abdominal pains  Swelling of the abdomen that is new, acute  Fever of 100F or higher    For urgent or emergent issues, a gastroenterologist can be reached at any hour by calling [phone].   DIET: Your first meal following the procedure should be a small meal and then it is ok to progress to your normal diet. Heavy or fried foods are harder to digest and may make you feel nauseous or bloated.  Likewise, meals heavy in dairy and vegetables can increase bloating.  Drink plenty of fluids but you should avoid alcoholic beverages for 24  hours.  ACTIVITY:  You should plan to take it easy for the rest of today and you should NOT DRIVE or use heavy machinery until tomorrow (because of the sedation medicines used during the test).    FOLLOW UP: Our staff will call the number listed on your records the next business day following your procedure to check on you and address any questions or concerns that you may have regarding the information given to you following your procedure. If we do not reach you, we will leave a message.  However, if you are feeling well and you are not experiencing any problems, there is no need to return our call.  We will assume that you have returned to your regular daily activities without incident.  If any biopsies were taken you will be contacted by phone or by letter within the next 1-3 weeks.  Please call us at [phone] if you have not heard about the biopsies in 3 weeks.    SIGNATURES/CONFIDENTIALITY: You and/or your care partner have signed paperwork which will be entered into your electronic medical record.  These signatures attest to the fact that that the information above on your After Visit Summary has been reviewed and is understood.  Full responsibility of the confidentiality of this discharge information lies with you and/or your care-partner.  INFORMATION ON POLYPS,DIVERTICULOSIS,AND HIGH FIBER DIET GIVEN TO YOU TODAY.  AWAIT LETTER FROM DR. PERRY WITH PATHOLOGY RESULTS.

## 2015-09-20 NOTE — Op Note (Signed)
[facility] Patient Name: Lacey [name] Procedure Date: 09/20/2015 7:46 AM MRN: [id number] Endoscopist: Docia Chuck. Henrene Pastor , MD Age: 65 Referring MD:  Date of Birth: [birth year] Gender: Female Procedure:                Colonoscopy, with cold biopsy removal of polyp Indications:              Surveillance: Personal history of adenomatous                            polyps on last colonoscopy 5 years ago Medicines:                Monitored Anesthesia Care Procedure:                Pre-Anesthesia Assessment:                           - Prior to the procedure, a History and Physical                            was performed, and patient medications and                            allergies were reviewed. The patient's tolerance of                            previous anesthesia was also reviewed. The risks                            and benefits of the procedure and the sedation                            options and risks were discussed with the patient.                            All questions were answered, and informed consent                            was obtained. Prior Anticoagulants: The patient has                            taken no previous anticoagulant or antiplatelet                            agents. ASA Grade Assessment: II - A patient with                            mild systemic disease. After reviewing the risks                            and benefits, the patient was deemed in                            satisfactory condition to undergo the procedure.  After obtaining informed consent, the colonoscope                            was passed under direct vision. Throughout the                            procedure, the patient's blood pressure, pulse, and                            oxygen saturations were monitored continuously. The                            Model CF-HQ190L [id number]) scope was introduced                            through the anus  and advanced to the the cecum,                            identified by appendiceal orifice and ileocecal                            valve. The ileocecal valve, appendiceal orifice,                            and rectum were photographed. The quality of the                            bowel preparation was excellent. The colonoscopy                            was performed without difficulty. The patient                            tolerated the procedure well. The bowel preparation                            used was SUPREP. Scope In: 8:14:16 AM Scope Out: 8:35:05 AM Scope Withdrawal Time: 0 hours 17 minutes 13 seconds  Total Procedure Duration: 0 hours 20 minutes 49 seconds  Findings:                 A 1 mm polyp was found in the ascending colon.                            Biopsies were taken with a cold forceps for polyp                            removal and for histology.                           A few small-mouthed diverticula were found in the                            sigmoid colon.  The exam was otherwise without abnormality on                            direct and retroflexion views. Complications:            No immediate complications. Estimated blood loss:                            None. Estimated Blood Loss:     Estimated blood loss: none. Impression:               - One 1 mm polyp in the ascending colon. Biopsied.                           - Diverticulosis in the sigmoid colon.                           - The examination was otherwise normal on direct                            and retroflexion views. Recommendation:           - Repeat colonoscopy in 5 years for surveillance.                           - Await pathology results. Docia Chuck. Henrene Pastor, MD 09/20/2015 8:47:07 AM This report has been signed electronically. CC Letter to:             Aundra Millet. Birdie Riddle, MD

## 2015-09-23 ENCOUNTER — Telehealth: Payer: Self-pay | Admitting: *Deleted

## 2015-09-23 NOTE — Telephone Encounter (Signed)
  Follow up Call-  Call back number 09/20/2015  Post procedure Call Back phone  # [phone]  Permission to leave phone message Yes     Patient questions:  Do you have a fever, pain , or abdominal swelling? No. Pain Score  0 *  Have you tolerated food without any problems? Yes.    Have you been able to return to your normal activities? Yes.    Do you have any questions about your discharge instructions: Diet   No. Medications  No. Follow up visit  No.  Do you have questions or concerns about your Care? No.  Actions: * If pain score is 4 or above: No action needed, pain <4.

## 2015-09-25 ENCOUNTER — Encounter: Payer: Self-pay | Admitting: Internal Medicine

## 2015-10-07 DIAGNOSIS — M5136 Other intervertebral disc degeneration, lumbar region: Secondary | ICD-10-CM | POA: Diagnosis not present

## 2015-10-07 DIAGNOSIS — M5416 Radiculopathy, lumbar region: Secondary | ICD-10-CM | POA: Diagnosis not present

## 2015-10-07 DIAGNOSIS — M5442 Lumbago with sciatica, left side: Secondary | ICD-10-CM | POA: Diagnosis not present

## 2015-10-08 ENCOUNTER — Other Ambulatory Visit: Payer: Self-pay | Admitting: Internal Medicine

## 2015-10-09 ENCOUNTER — Other Ambulatory Visit: Payer: Self-pay | Admitting: General Practice

## 2015-10-09 MED ORDER — INSULIN PEN NEEDLE 31G X 8 MM MISC: Status: DC

## 2015-10-10 NOTE — Telephone Encounter (Signed)
I'm not sure how she is taking this. Can you please call her to find out and clarify this on the instructions. She can have it refilled, but will need to clarify first. She is scheduled to f/u 7/3.  Thanks!

## 2015-10-23 ENCOUNTER — Telehealth: Payer: Self-pay | Admitting: *Deleted

## 2015-10-23 MED ORDER — METOPROLOL SUCCINATE ER 100 MG PO TB24: ORAL_TABLET | ORAL | Status: DC

## 2015-10-23 NOTE — Telephone Encounter (Signed)
Per pharmacy request changed to Metoprolol XL 100 mg to take 50 mg bid, both pt & Alvis Lemmings agreed to this.

## 2015-10-31 ENCOUNTER — Ambulatory Visit (INDEPENDENT_AMBULATORY_CARE_PROVIDER_SITE_OTHER): Payer: Medicare Other | Admitting: Family Medicine

## 2015-10-31 ENCOUNTER — Encounter: Payer: Self-pay | Admitting: Family Medicine

## 2015-10-31 VITALS — BP 120/62 | HR 90 | Temp 98.2°F | Resp 16 | Ht 62.0 in | Wt 227.2 lb

## 2015-10-31 DIAGNOSIS — E1165 Type 2 diabetes mellitus with hyperglycemia: Secondary | ICD-10-CM

## 2015-10-31 DIAGNOSIS — I110 Hypertensive heart disease with heart failure: Secondary | ICD-10-CM

## 2015-10-31 DIAGNOSIS — E1140 Type 2 diabetes mellitus with diabetic neuropathy, unspecified: Secondary | ICD-10-CM | POA: Diagnosis not present

## 2015-10-31 DIAGNOSIS — IMO0002 Reserved for concepts with insufficient information to code with codable children: Secondary | ICD-10-CM

## 2015-10-31 DIAGNOSIS — I509 Heart failure, unspecified: Secondary | ICD-10-CM

## 2015-10-31 DIAGNOSIS — E785 Hyperlipidemia, unspecified: Secondary | ICD-10-CM | POA: Diagnosis not present

## 2015-10-31 DIAGNOSIS — E1169 Type 2 diabetes mellitus with other specified complication: Secondary | ICD-10-CM

## 2015-10-31 LAB — HEPATIC FUNCTION PANEL
ALT: 12 U/L (ref 0–35)
AST: 15 U/L (ref 0–37)
Albumin: 3.9 g/dL (ref 3.5–5.2)
Alkaline Phosphatase: 73 U/L (ref 39–117)
BILIRUBIN DIRECT: 0.1 mg/dL (ref 0.0–0.3)
BILIRUBIN TOTAL: 0.3 mg/dL (ref 0.2–1.2)
Total Protein: 6.7 g/dL (ref 6.0–8.3)

## 2015-10-31 LAB — CBC WITH DIFFERENTIAL/PLATELET
BASOS PCT: 0.5 % (ref 0.0–3.0)
Basophils Absolute: 0.1 10*3/uL (ref 0.0–0.1)
EOS ABS: 0.3 10*3/uL (ref 0.0–0.7)
EOS PCT: 2.5 % (ref 0.0–5.0)
HEMATOCRIT: 37.8 % (ref 36.0–46.0)
HEMOGLOBIN: 12.4 g/dL (ref 12.0–15.0)
LYMPHS PCT: 24.8 % (ref 12.0–46.0)
Lymphs Abs: 2.6 10*3/uL (ref 0.7–4.0)
MCHC: 32.9 g/dL (ref 30.0–36.0)
MCV: 89.5 fl (ref 78.0–100.0)
MONOS PCT: 5.9 % (ref 3.0–12.0)
Monocytes Absolute: 0.6 10*3/uL (ref 0.1–1.0)
Neutro Abs: 7 10*3/uL (ref 1.4–7.7)
Neutrophils Relative %: 66.3 % (ref 43.0–77.0)
Platelets: 248 10*3/uL (ref 150.0–400.0)
RBC: 4.22 Mil/uL (ref 3.87–5.11)
RDW: 15.4 % (ref 11.5–15.5)
WBC: 10.6 10*3/uL — AB (ref 4.0–10.5)

## 2015-10-31 LAB — BASIC METABOLIC PANEL
BUN: 23 mg/dL (ref 6–23)
CHLORIDE: 105 meq/L (ref 96–112)
CO2: 26 mEq/L (ref 19–32)
CREATININE: 0.89 mg/dL (ref 0.40–1.20)
Calcium: 9.1 mg/dL (ref 8.4–10.5)
GFR: 67.56 mL/min (ref >60.00)
GLUCOSE: 164 mg/dL — AB (ref 70–99)
Potassium: 4.9 mEq/L (ref 3.5–5.1)
Sodium: 138 mEq/L (ref 135–145)

## 2015-10-31 LAB — LIPID PANEL
CHOL/HDL RATIO: 3
Cholesterol: 121 mg/dL (ref 0–200)
HDL: 37.1 mg/dL — AB (ref >39.00)
LDL Cholesterol: 54 mg/dL (ref 0–99)
NONHDL: 84.33
TRIGLYCERIDES: 152 mg/dL — AB (ref 0.0–149.0)
VLDL: 30.4 mg/dL (ref 0.0–40.0)

## 2015-10-31 LAB — HEMOGLOBIN A1C: Hgb A1c MFr Bld: 7.5 % — ABNORMAL HIGH (ref 4.6–6.5)

## 2015-10-31 LAB — TSH: TSH: 1.17 u[IU]/mL (ref 0.35–4.50)

## 2015-10-31 NOTE — Assessment & Plan Note (Signed)
Chronic problem.  Tolerating statin w/o difficulty.  Check labs.  Adjust meds prn  

## 2015-10-31 NOTE — Assessment & Plan Note (Signed)
Chronic problem.  Well controlled.  Asymptomatic.  Check labs.  No anticipated med changes. 

## 2015-10-31 NOTE — Progress Notes (Signed)
   Subjective:    Patient ID: Lacey [name], female    DOB: [birth year], 65 y.o.   MRN: [id number]  HPI DM- chronic problem, on Jardiance, Novolog, Tresiba.  Due for eye exam next month- pt has this scheduled.  Due for foot exam today.  On ARB for renal protection.  Home CBGs 'really good in the morning'.  A few symptomatic lows.  No CP, SOB, HAs, visual changes, edema.  Rare numbness/tingling of hands/feet.  HTN- chronic problem, on Metoprolol and Valsartan.  Denies CP, SOB, HAs, visual changes, edema.  Hyperlipidemia- chronic problem, on Lipitor and fenofibrate.  Denies abd pain, N/V, myalgias.   Review of Systems For ROS see HPI     Objective:   Physical Exam  Constitutional: She is oriented to person, place, and time. She appears well-developed and well-nourished. No distress.  HENT:  Head: Normocephalic and atraumatic.  Eyes: Conjunctivae and EOM are normal. Pupils are equal, round, and reactive to light.  Neck: Normal range of motion. Neck supple. No thyromegaly present.  Cardiovascular: Normal rate, regular rhythm, normal heart sounds and intact distal pulses.   No murmur heard. Pulmonary/Chest: Effort normal and breath sounds normal. No respiratory distress.  Abdominal: Soft. She exhibits no distension. There is no tenderness.  Musculoskeletal: She exhibits no edema.  Lymphadenopathy:    She has no cervical adenopathy.  Neurological: She is alert and oriented to person, place, and time.  Skin: Skin is warm and dry.  Psychiatric: She has a normal mood and affect. Her behavior is normal.  Vitals reviewed.         Assessment & Plan:

## 2015-10-31 NOTE — Progress Notes (Signed)
Pre visit review using our clinic review tool, if applicable. No additional management support is needed unless otherwise documented below in the visit note. 

## 2015-10-31 NOTE — Assessment & Plan Note (Signed)
Chronic problem.  Pt reports sugars are currently well.  Has eye exam scheduled.  Foot exam done today.  On ARB for renal protection.  Encouraged healthy diet and regular exercise.  Check labs.  Adjust meds prn

## 2015-10-31 NOTE — Patient Instructions (Signed)
Follow up in 3-4 months to recheck diabetes We'll notify you of your lab results and make any changes if needed Continue to work on healthy diet and regular exercise- you can do it! Have them send me a copy of your eye exam Call with any questions or concerns Happy 4th!!!

## 2015-11-03 ENCOUNTER — Other Ambulatory Visit: Payer: Self-pay | Admitting: Internal Medicine

## 2015-11-04 ENCOUNTER — Ambulatory Visit (INDEPENDENT_AMBULATORY_CARE_PROVIDER_SITE_OTHER): Payer: Medicare Other | Admitting: Internal Medicine

## 2015-11-04 ENCOUNTER — Encounter: Payer: Self-pay | Admitting: Internal Medicine

## 2015-11-04 VITALS — BP 130/58 | HR 75 | Ht 62.0 in | Wt 230.0 lb

## 2015-11-04 DIAGNOSIS — I471 Supraventricular tachycardia: Secondary | ICD-10-CM | POA: Diagnosis not present

## 2015-11-04 DIAGNOSIS — I5030 Unspecified diastolic (congestive) heart failure: Secondary | ICD-10-CM

## 2015-11-04 NOTE — Patient Instructions (Signed)

## 2015-11-04 NOTE — Progress Notes (Signed)
Patient Care Team: Midge Minium, MD as PCP - General Deboraha Sprang, MD as Consulting Physician (Cardiology) Almedia Balls, MD as Consulting Physician (Orthopedic Surgery) Philemon Kingdom, MD as Consulting Physician (Internal Medicine) Kathie Rhodes, MD as Consulting Physician (Urology) Dyke Maes, OD as Consulting Physician (Optometry) Irene Shipper, MD as Consulting Physician (Gastroenterology)   HPI  Lacey [name] is a 65 y.o. female  Seen with a  history of palpitations. They were thought upon evaluation to be caused by atrial tachycardia  She also has hypertension  She has had increasing problems with dyspnea on exertion. She has noted more peripheral edema. Dyspnea on exertion is associated with chest tightness with radiation. This is not occurring at rest as is noted last visit. She is short of breath at less than 150 feet.  She continues to smoke albeit infrequently.  Is seen Dr. Radford Pax for sleep apnea-- modest and is using her own retainer  Much better since retiring   She has no [place] CAD per cath 3/06 .An echocardiogram 04/30/10: EF 55-60%, mild LVH, grade 1 diastolic dysfunction. She had a Myoview done in 04/30/10: EF 63%, no scar, no ischemia.  Repeat scanning 6/16 was normal    TSH was normal 7 months ago  Past Medical History  Diagnosis Date  . Hyperlipidemia   . Hypertension   . Vitamin D deficiency   . Adenomatous colon polyp     hyperplastic  . Diverticulosis   . Diabetes mellitus, type 2 (HCC)     ORAL AND INSULIN MEDS (LAST A1C  7.3)  . Atrial tachycardia ([place]) CARDIOLOGIST - DR Caryl Comes (LAST VISIT AUG 2012)    Echo 12/11: EF 55-60%, Mild LVH, grade 1 diast dysfxn;   holter 1/12: ATach  . Neuropathy, peripheral ([place])   . Erythema CURRENT-- CLOSED ABD. WALL ABSCESS    PER PCP NOTE (03-31-11)- MRSA-- TAKES DOXYCYCLINE  . Sepsis, urinary HISTORY - 2004  . Restless leg syndrome   . Insomnia     TAKES MEDS PRN  . Arthritis     NECK,  KNEES, FINGERS, TOES  . GERD (gastroesophageal reflux disease)     CONTROLLED W/ PREVACID  . Atypical chest pain     a. 07/2004 Cath: Clean cors;  b. 04/2010 Myoview: EF 63%, no ischemia  . Anginal pain ([place])     not recent  . Pneumonia     as child  . History of kidney stones 2012  . Post op infection 11/07/12    left bunionectomy  . Obesity (BMI 30-39.9) 02/19/2015  . Chronic kidney disease   . Blood transfusion without reported diagnosis 1969  . Sleep apnea     Past Surgical History  Procedure Laterality Date  . Laparoscopic cholecystectomy  2001  . Carpal tunnel release  2000    RIGHT  . Cervical fusion  10-12-07    C5 - 7  . Right thumb joint surg.   MAY 2011  . Gastric bypass  1981  . Knee arthroscopy  AUG 2012    LEFT  . Cystoscopy/retrograde/ureteroscopy/stone extraction with basket  X2 2004 & 2009    LEFT   . Ureteral stent placement  X2  2004  &  2009    LEFT  . Percutaneous nephrostolithotomy  02-27-11    LEFT  . Trigger finger release  2010    RIGHT THUMB  . Ureteroscopy  04/06/2011    Procedure: URETEROSCOPY;  Surgeon: Claybon Jabs, MD;  Location:  [facility];  Service: Urology;  Laterality: Left;  L Ureteroscopy Laser Litho & Stent   . Cardiac catheterization  07/10/04  . Abdominal hysterectomy  1995    ovaries remain  . Left thumb joint surgery  2013  . Bunionectomy Left 10/24/12  . Cystoscopy with retrograde pyelogram, ureteroscopy and stent placement Left 11/28/2012    Procedure: LEFT RETROGRADE PYELOGRAM, LEFT URETEROSCOPY, ;  Surgeon: Claybon Jabs, MD;  Location: [facility];  Service: Urology;  Laterality: Left;  . Colonoscopy    . Polypectomy    . Upper gastrointestinal endoscopy      Current Outpatient Prescriptions  Medication Sig Dispense Refill  . aspirin EC 81 MG tablet Take 81 mg by mouth daily.    Marland Kitchen atorvastatin (LIPITOR) 20 MG tablet Take 20 mg by mouth daily.    . celecoxib (CELEBREX) 200 MG capsule Take 200 mg by mouth  every morning.    . citalopram (CELEXA) 20 MG tablet TAKE 1 TABLET (20 MG TOTAL) BY MOUTH DAILY. 90 tablet 1  . clonazePAM (KLONOPIN) 1 MG tablet Take 1 tablet (1 mg total) by mouth at bedtime. 1 tab nightly for restless leg. 90 tablet 1  . clotrimazole-betamethasone (LOTRISONE) cream Apply 1 application topically 2 (two) times daily. 45 g 0  . diphenoxylate-atropine (LOMOTIL) 2.5-0.025 MG per tablet Take 1 tablet by mouth 4 (four) times daily as needed for diarrhea or loose stools. 30 tablet 0  . empagliflozin (JARDIANCE) 10 MG TABS tablet Take 10 mg by mouth daily. 90 tablet 1  . fenofibrate 160 MG tablet Take 160 mg by mouth daily.    . furosemide (LASIX) 20 MG tablet Take 20 mg by mouth daily.    Marland Kitchen gabapentin (NEURONTIN) 300 MG capsule Take 300 mg by mouth daily.    Marland Kitchen glucose blood (ONETOUCH VERIO) test strip Use on e strip each time sugars are tested, pt tests 4 times daily. Dx. E11.40 200 each 12  . hydrocortisone valerate cream (WESTCORT) 0.2 % Apply 1 application topically as needed (for skin irritation).    . insulin aspart (NOVOLOG FLEXPEN) 100 UNIT/ML FlexPen Inject 35 Units into the skin 3 (three) times daily with meals.    . Insulin Degludec (TRESIBA FLEXTOUCH) 200 UNIT/ML SOPN Inject 80 Units into the skin at bedtime. 12 pen 1  . Insulin Pen Needle (B-D ULTRAFINE III SHORT PEN) 31G X 8 MM MISC USE FOUR TIMES A DAY 360 each 1  . lansoprazole (PREVACID) 15 MG capsule Take 15 mg by mouth daily.    Marland Kitchen levothyroxine (SYNTHROID, LEVOTHROID) 50 MCG tablet Take 50 mcg by mouth daily before breakfast.    . loperamide (IMODIUM) 2 MG capsule Take 2 mg by mouth 4 (four) times daily as needed for diarrhea or loose stools.    . metoprolol succinate (TOPROL-XL) 100 MG 24 hr tablet Take 50 mg by mouth twice a day 90 tablet 2  . nitrofurantoin, macrocrystal-monohydrate, (MACROBID) 100 MG capsule Take 100 mg by mouth as needed (for urinary tract infection). Take 1 capsule by mouth twice daily for 7 days     . ondansetron (ZOFRAN) 4 MG tablet Take 1 tablet (4 mg total) by mouth every 8 (eight) hours as needed for nausea or vomiting. 30 tablet 0  . ONETOUCH DELICA LANCETS 99991111 MISC USE FOUR TIMES A DAY 360 each 6  . oxyCODONE-acetaminophen (PERCOCET/ROXICET) 5-325 MG per tablet 1 tablet every 6 (six) hours as needed (for pain). Reported on 09/20/2015  0  .  traZODone (DESYREL) 50 MG tablet Take 50 mg by mouth at bedtime as needed for sleep.    . valsartan (DIOVAN) 320 MG tablet Take 1 tablet (320 mg total) by mouth daily. 90 tablet 1   No current facility-administered medications for this visit.    No Known Allergies  Review of Systems negative except from HPI and PMH  Physical Exam BP 130/58 mmHg  Pulse 75  Ht 5\' 2"  (1.575 m)  Wt 230 lb (104.327 kg)  BMI 42.06 kg/m2  SpO2 94%  LMP 06/06/1993 BP 130/58 mmHg  Pulse 75  Ht 5\' 2"  (1.575 m)  Wt 230 lb (104.327 kg)  BMI 42.06 kg/m2  SpO2 94%  LMP 06/06/1993  Well developed and nourished in no acute distress HENT normal Neck supple with JVP-8-9  Clear Regular rate and rhythm, no murmurs or gallops Abd-soft with active BS No Clubbing cyanosis 1+edema Skin-warm and damp A & Oriented  Grossly normal sensory and motor function  ECG from March demonstrated sinus rhythm with atrial ectopic beats Rate was 71 intervals 15/09/39     Assessment and  Plan     Atrial tachycardia  Symptomatically quiet  Sleep disordered breathing  HFpEF  Hypertension  She is much better since having quit work.  Euvolemic continue current meds

## 2015-11-06 ENCOUNTER — Other Ambulatory Visit: Payer: Self-pay | Admitting: *Deleted

## 2015-11-06 MED ORDER — FUROSEMIDE 20 MG PO TABS: 20.0000 mg | ORAL_TABLET | Freq: Every day | ORAL | Status: DC

## 2015-11-08 DIAGNOSIS — M545 Low back pain: Secondary | ICD-10-CM | POA: Diagnosis not present

## 2015-11-08 DIAGNOSIS — M5136 Other intervertebral disc degeneration, lumbar region: Secondary | ICD-10-CM | POA: Diagnosis not present

## 2015-11-08 DIAGNOSIS — M4726 Other spondylosis with radiculopathy, lumbar region: Secondary | ICD-10-CM | POA: Diagnosis not present

## 2015-11-15 ENCOUNTER — Other Ambulatory Visit: Payer: Self-pay | Admitting: General Practice

## 2015-11-15 MED ORDER — INSULIN DEGLUDEC 200 UNIT/ML ~~LOC~~ SOPN: 80.0000 [IU] | PEN_INJECTOR | Freq: Every day | SUBCUTANEOUS | Status: DC

## 2015-11-15 MED ORDER — EMPAGLIFLOZIN 10 MG PO TABS: 10.0000 mg | ORAL_TABLET | Freq: Every day | ORAL | Status: DC

## 2015-12-04 DIAGNOSIS — H524 Presbyopia: Secondary | ICD-10-CM | POA: Diagnosis not present

## 2015-12-04 DIAGNOSIS — E113299 Type 2 diabetes mellitus with mild nonproliferative diabetic retinopathy without macular edema, unspecified eye: Secondary | ICD-10-CM | POA: Diagnosis not present

## 2015-12-04 DIAGNOSIS — H5203 Hypermetropia, bilateral: Secondary | ICD-10-CM | POA: Diagnosis not present

## 2015-12-04 LAB — HM DIABETES EYE EXAM

## 2015-12-18 ENCOUNTER — Other Ambulatory Visit: Payer: Self-pay | Admitting: General Practice

## 2015-12-18 DIAGNOSIS — Z1231 Encounter for screening mammogram for malignant neoplasm of breast: Secondary | ICD-10-CM | POA: Diagnosis not present

## 2015-12-18 LAB — HM MAMMOGRAPHY

## 2015-12-18 MED ORDER — GLUCOSE BLOOD VI STRP: ORAL_STRIP | 12 refills | Status: DC

## 2015-12-20 ENCOUNTER — Encounter: Payer: Self-pay | Admitting: General Practice

## 2015-12-20 ENCOUNTER — Telehealth: Payer: Self-pay | Admitting: Family Medicine

## 2015-12-20 ENCOUNTER — Other Ambulatory Visit: Payer: Self-pay | Admitting: General Practice

## 2015-12-20 MED ORDER — INSULIN DEGLUDEC 200 UNIT/ML ~~LOC~~ SOPN: 80.0000 [IU] | PEN_INJECTOR | Freq: Every day | SUBCUTANEOUS | 1 refills | Status: DC

## 2015-12-20 MED ORDER — ONETOUCH VERIO VI STRP: ORAL_STRIP | 1 refills | Status: DC

## 2015-12-20 MED ORDER — EMPAGLIFLOZIN 10 MG PO TABS: 10.0000 mg | ORAL_TABLET | Freq: Every day | ORAL | 1 refills | Status: DC

## 2015-12-20 MED ORDER — ATORVASTATIN CALCIUM 20 MG PO TABS: 20.0000 mg | ORAL_TABLET | Freq: Every day | ORAL | 1 refills | Status: DC

## 2015-12-20 NOTE — Telephone Encounter (Signed)
Med filled again, changed to DAW so one touch verio is shown .

## 2015-12-20 NOTE — Telephone Encounter (Signed)
Pt states that [facility] states that the Rx for test strips needs to say glucose blood (ONETOUCH VERIO) test strip. Pt states she is having a hard time with this.

## 2016-01-07 DIAGNOSIS — G894 Chronic pain syndrome: Secondary | ICD-10-CM | POA: Diagnosis not present

## 2016-02-05 ENCOUNTER — Other Ambulatory Visit: Payer: Self-pay | Admitting: Family Medicine

## 2016-02-06 ENCOUNTER — Encounter: Payer: Self-pay | Admitting: Family Medicine

## 2016-02-06 ENCOUNTER — Ambulatory Visit (INDEPENDENT_AMBULATORY_CARE_PROVIDER_SITE_OTHER): Payer: BC Managed Care – PPO | Admitting: Family Medicine

## 2016-02-06 VITALS — BP 122/82 | HR 82 | Temp 98.0°F | Resp 17 | Ht 62.0 in | Wt 232.2 lb

## 2016-02-06 DIAGNOSIS — E1140 Type 2 diabetes mellitus with diabetic neuropathy, unspecified: Secondary | ICD-10-CM | POA: Diagnosis not present

## 2016-02-06 DIAGNOSIS — E1165 Type 2 diabetes mellitus with hyperglycemia: Secondary | ICD-10-CM | POA: Diagnosis not present

## 2016-02-06 DIAGNOSIS — IMO0002 Reserved for concepts with insufficient information to code with codable children: Secondary | ICD-10-CM

## 2016-02-06 DIAGNOSIS — Z1159 Encounter for screening for other viral diseases: Secondary | ICD-10-CM

## 2016-02-06 DIAGNOSIS — Z23 Encounter for immunization: Secondary | ICD-10-CM

## 2016-02-06 LAB — BASIC METABOLIC PANEL
BUN: 23 mg/dL (ref 6–23)
CHLORIDE: 105 meq/L (ref 96–112)
CO2: 29 mEq/L (ref 19–32)
CREATININE: 0.97 mg/dL (ref 0.40–1.20)
Calcium: 9.3 mg/dL (ref 8.4–10.5)
GFR: 61.12 mL/min (ref >60.00)
GLUCOSE: 148 mg/dL — AB (ref 70–99)
Potassium: 4.9 mEq/L (ref 3.5–5.1)
Sodium: 141 mEq/L (ref 135–145)

## 2016-02-06 LAB — HEMOGLOBIN A1C: Hgb A1c MFr Bld: 8.3 % — ABNORMAL HIGH (ref 4.6–6.5)

## 2016-02-06 NOTE — Progress Notes (Signed)
   Subjective:    Patient ID: Lacey [name], female    DOB: 18-Dec-1950, 65 y.o.   MRN: [id number]  HPI DM- chronic problem.  On Jardiance, Novolog, and Tresiba.  On ARB for renal protection.  UTD on foot exam, eye exam.  Denies symptomatic lows.  Pt is checking CBGs 4-5x/day.  CBG this AM was 159.  Denies CP, SOB, HAs, visual changes, abd pain, N/V, edema.  Having some numbness in feet when lying down at night.   Review of Systems For ROS see HPI     Objective:   Physical Exam  Constitutional: She is oriented to person, place, and time. She appears well-developed and well-nourished. No distress.  HENT:  Head: Normocephalic and atraumatic.  Eyes: Conjunctivae and EOM are normal. Pupils are equal, round, and reactive to light.  Neck: Normal range of motion. Neck supple. No thyromegaly present.  Cardiovascular: Normal rate, regular rhythm, normal heart sounds and intact distal pulses.   No murmur heard. Pulmonary/Chest: Effort normal and breath sounds normal. No respiratory distress.  Abdominal: Soft. She exhibits no distension. There is no tenderness.  Musculoskeletal: She exhibits no edema.  Lymphadenopathy:    She has no cervical adenopathy.  Neurological: She is alert and oriented to person, place, and time.  Skin: Skin is warm and dry.  Psychiatric: She has a normal mood and affect. Her behavior is normal.  Vitals reviewed.         Assessment & Plan:

## 2016-02-06 NOTE — Patient Instructions (Signed)
Schedule your complete physical in 3-4 months We'll notify you of your lab results and make any changes if needed Continue to work on healthy diet and regular exercise- you can do it!!! Call your eye doctor and figure out the next steps for the eye exam Call with any questions or concerns Happy Fall!!!

## 2016-02-06 NOTE — Assessment & Plan Note (Signed)
Chronic problem.  Currently asymptomatic on Jardiance, Novolog, Tresiba.  UTD on eye exam, foot exam.  On ARB for renal protection.  Check labs.  Adjust meds prn

## 2016-02-06 NOTE — Addendum Note (Signed)
Addended by: Davis Gourd on: 02/06/2016 10:57 AM   Modules accepted: Orders

## 2016-02-06 NOTE — Progress Notes (Signed)
Pre visit review using our clinic review tool, if applicable. No additional management support is needed unless otherwise documented below in the visit note. 

## 2016-02-07 LAB — HEPATITIS C ANTIBODY: HCV Ab: NEGATIVE

## 2016-02-23 ENCOUNTER — Encounter: Payer: Self-pay | Admitting: Family Medicine

## 2016-02-24 ENCOUNTER — Encounter (INDEPENDENT_AMBULATORY_CARE_PROVIDER_SITE_OTHER): Payer: Medicare Other | Admitting: Ophthalmology

## 2016-02-24 DIAGNOSIS — H43813 Vitreous degeneration, bilateral: Secondary | ICD-10-CM | POA: Diagnosis not present

## 2016-02-24 DIAGNOSIS — H353131 Nonexudative age-related macular degeneration, bilateral, early dry stage: Secondary | ICD-10-CM | POA: Diagnosis not present

## 2016-02-24 DIAGNOSIS — I10 Essential (primary) hypertension: Secondary | ICD-10-CM | POA: Diagnosis not present

## 2016-02-24 DIAGNOSIS — H35033 Hypertensive retinopathy, bilateral: Secondary | ICD-10-CM

## 2016-02-24 DIAGNOSIS — H2513 Age-related nuclear cataract, bilateral: Secondary | ICD-10-CM | POA: Diagnosis not present

## 2016-02-24 LAB — HM DIABETES EYE EXAM

## 2016-02-26 ENCOUNTER — Encounter: Payer: Self-pay | Admitting: General Practice

## 2016-03-10 ENCOUNTER — Telehealth: Payer: Self-pay | Admitting: Acute Care

## 2016-03-10 DIAGNOSIS — F17210 Nicotine dependence, cigarettes, uncomplicated: Secondary | ICD-10-CM

## 2016-03-10 NOTE — Telephone Encounter (Signed)
This is documentation of the phone call made to Mrs. Lacey [name] on 07/26/2015. I explained during that phone call that her low-dose screening CT was read as a Lung RADS 2: nodules that are benign in appearance and behavior with a very low likelihood of becoming a clinically active cancer due to size or lack of growth. Recommendation per radiology is for a repeat LDCT in 12 months. I told her that we would order and schedule her annual screening scan for March 2018. She verbalized understanding and had no further questions at completion of the call.

## 2016-03-11 ENCOUNTER — Other Ambulatory Visit: Payer: Self-pay | Admitting: General Practice

## 2016-03-11 MED ORDER — CLONAZEPAM 1 MG PO TABS: 1.0000 mg | ORAL_TABLET | Freq: Every day | ORAL | 1 refills | Status: DC

## 2016-03-11 MED ORDER — INSULIN PEN NEEDLE 31G X 8 MM MISC: 1 refills | Status: DC

## 2016-03-20 ENCOUNTER — Other Ambulatory Visit: Payer: Self-pay | Admitting: General Practice

## 2016-03-20 NOTE — Telephone Encounter (Signed)
Last OV 02/06/16 novolog last filled 09/30/11 3 vials with no refills. Pt was informed to contact [facility] for an appt based on last labs  Is she still on this medication?

## 2016-03-20 NOTE — Telephone Encounter (Signed)
From my last note, pt is on Novolog but I do recommend that Dr Cruzita Lederer manage her diabetes due to her A1C climbing at last check so this refill needs to come from her and she needs an appt

## 2016-04-02 ENCOUNTER — Other Ambulatory Visit: Payer: Self-pay | Admitting: General Practice

## 2016-04-02 ENCOUNTER — Telehealth: Payer: Self-pay | Admitting: *Deleted

## 2016-04-02 MED ORDER — INSULIN ASPART 100 UNIT/ML FLEXPEN: 35.0000 [IU] | PEN_INJECTOR | Freq: Three times a day (TID) | SUBCUTANEOUS | 3 refills | Status: DC

## 2016-04-02 NOTE — Telephone Encounter (Signed)
[facility] calling about patient medication.   Asked that you call [phone] Reference Number: [id number]

## 2016-04-02 NOTE — Telephone Encounter (Signed)
Called and was informed that pharmacy needed a refill on patients Novlog. Per last OV note pt is still on this medication. Rx was sent electronically so that we had record of the refill.

## 2016-04-02 NOTE — Addendum Note (Signed)
Addended by: Davis Gourd on: 04/02/2016 01:53 PM   Modules accepted: Orders

## 2016-04-22 DIAGNOSIS — M549 Dorsalgia, unspecified: Secondary | ICD-10-CM | POA: Diagnosis not present

## 2016-04-22 DIAGNOSIS — R8279 Other abnormal findings on microbiological examination of urine: Secondary | ICD-10-CM | POA: Diagnosis not present

## 2016-04-28 ENCOUNTER — Encounter: Payer: Self-pay | Admitting: Family Medicine

## 2016-04-29 ENCOUNTER — Other Ambulatory Visit: Payer: Self-pay | Admitting: General Practice

## 2016-04-29 MED ORDER — INSULIN ASPART 100 UNIT/ML FLEXPEN: 35.0000 [IU] | PEN_INJECTOR | Freq: Three times a day (TID) | SUBCUTANEOUS | 0 refills | Status: DC

## 2016-05-13 ENCOUNTER — Encounter: Payer: Self-pay | Admitting: Family Medicine

## 2016-05-13 MED ORDER — VALSARTAN 320 MG PO TABS: 320.0000 mg | ORAL_TABLET | Freq: Every day | ORAL | 1 refills | Status: DC

## 2016-05-22 ENCOUNTER — Telehealth: Payer: Self-pay | Admitting: Family Medicine

## 2016-05-22 ENCOUNTER — Ambulatory Visit: Payer: BC Managed Care – PPO | Admitting: Family Medicine

## 2016-05-22 ENCOUNTER — Other Ambulatory Visit: Payer: Self-pay | Admitting: General Practice

## 2016-05-22 MED ORDER — TRAZODONE HCL 50 MG PO TABS: 50.0000 mg | ORAL_TABLET | Freq: Every evening | ORAL | 1 refills | Status: DC | PRN

## 2016-05-22 MED ORDER — INSULIN LISPRO 100 UNIT/ML (KWIKPEN): 35.0000 [IU] | PEN_INJECTOR | Freq: Three times a day (TID) | SUBCUTANEOUS | 3 refills | Status: DC

## 2016-05-22 MED ORDER — LEVOTHYROXINE SODIUM 50 MCG PO TABS: 50.0000 ug | ORAL_TABLET | Freq: Every day | ORAL | 1 refills | Status: DC

## 2016-05-22 MED ORDER — GABAPENTIN 300 MG PO CAPS: 300.0000 mg | ORAL_CAPSULE | Freq: Every day | ORAL | 1 refills | Status: DC

## 2016-05-22 NOTE — Telephone Encounter (Signed)
Please advise 

## 2016-05-22 NOTE — Telephone Encounter (Signed)
Ok to send script for Humalog- it is a 1:1 switch from the [employer]

## 2016-05-22 NOTE — Telephone Encounter (Signed)
New insulin filled to pharmacy.

## 2016-05-22 NOTE — Telephone Encounter (Signed)
Patient has rescheduled her cpe appt to 06/04/16 due to inclement weather.  She states she will not have enough insulin to last until then.  Per patient, [employer] will not [employer] but they will cover Humalog.  Please send rx for Humalog to [facility].

## 2016-05-25 ENCOUNTER — Encounter: Payer: Self-pay | Admitting: Family Medicine

## 2016-05-25 MED ORDER — ATORVASTATIN CALCIUM 20 MG PO TABS: 20.0000 mg | ORAL_TABLET | Freq: Every day | ORAL | 1 refills | Status: DC

## 2016-05-25 MED ORDER — CLONAZEPAM 1 MG PO TABS: 1.0000 mg | ORAL_TABLET | Freq: Every day | ORAL | 1 refills | Status: DC

## 2016-05-25 MED ORDER — TRAZODONE HCL 50 MG PO TABS: 50.0000 mg | ORAL_TABLET | Freq: Every evening | ORAL | 1 refills | Status: DC | PRN

## 2016-05-25 MED ORDER — EMPAGLIFLOZIN 10 MG PO TABS
10.0000 mg | ORAL_TABLET | Freq: Every day | ORAL | 1 refills | Status: DC
Start: 2016-05-25 — End: 2016-08-11

## 2016-05-25 MED ORDER — LEVOTHYROXINE SODIUM 50 MCG PO TABS: 50.0000 ug | ORAL_TABLET | Freq: Every day | ORAL | 1 refills | Status: DC

## 2016-05-27 ENCOUNTER — Ambulatory Visit (INDEPENDENT_AMBULATORY_CARE_PROVIDER_SITE_OTHER): Payer: Medicare Other | Admitting: Family Medicine

## 2016-05-27 ENCOUNTER — Encounter: Payer: Self-pay | Admitting: Family Medicine

## 2016-05-27 VITALS — BP 132/80 | HR 77 | Temp 98.0°F | Resp 16 | Ht 62.0 in | Wt 232.1 lb

## 2016-05-27 DIAGNOSIS — J019 Acute sinusitis, unspecified: Secondary | ICD-10-CM | POA: Diagnosis not present

## 2016-05-27 DIAGNOSIS — M5410 Radiculopathy, site unspecified: Secondary | ICD-10-CM | POA: Diagnosis not present

## 2016-05-27 MED ORDER — GUAIFENESIN-CODEINE 100-10 MG/5ML PO SYRP: 10.0000 mL | ORAL_SOLUTION | Freq: Three times a day (TID) | ORAL | 0 refills | Status: DC | PRN

## 2016-05-27 MED ORDER — AMOXICILLIN 875 MG PO TABS: 875.0000 mg | ORAL_TABLET | Freq: Two times a day (BID) | ORAL | 0 refills | Status: DC

## 2016-05-27 NOTE — Progress Notes (Signed)
   Subjective:    Patient ID: Lacey [name], female    DOB: 1950-11-25, 66 y.o.   MRN: [id number]  HPI 'winter time blues'- pt reports ongoing hoarseness, coughing, sneezing.  URI sxs present since prior to Christmas.  Tm 100.7.  Denies sinus pressure.  + 'severe headaches'.  R ear pain.  Cough is not productive.  + SOB and wheezing.  Chronic low back pain- pt had injxn at [place] and 'it did absolutely nothing'.  Has MRI on CD and would like to see back specialist.   Review of Systems For ROS see HPI     Objective:   Physical Exam  Constitutional: She is oriented to person, place, and time. She appears well-developed and well-nourished. No distress.  HENT:  Head: Normocephalic and atraumatic.  Right Ear: Tympanic membrane normal.  Left Ear: Tympanic membrane normal.  Nose: Mucosal edema and rhinorrhea present. Right sinus exhibits maxillary sinus tenderness and frontal sinus tenderness. Left sinus exhibits maxillary sinus tenderness and frontal sinus tenderness.  Mouth/Throat: Uvula is midline and mucous membranes are normal. Posterior oropharyngeal erythema present. No oropharyngeal exudate.  + laryngitis  Eyes: Conjunctivae and EOM are normal. Pupils are equal, round, and reactive to light.  Neck: Normal range of motion. Neck supple.  Cardiovascular: Normal rate, regular rhythm and normal heart sounds.   Pulmonary/Chest: Effort normal and breath sounds normal. No respiratory distress. She has no wheezes.  Lymphadenopathy:    She has no cervical adenopathy.  Neurological: She is alert and oriented to person, place, and time.  Skin: Skin is warm and dry.  Psychiatric: She has a normal mood and affect. Her behavior is normal. Thought content normal.  Vitals reviewed.         Assessment & Plan:  Acute sinusitis- pt's waxing and waning sxs and recent worsening w/ fever is consistent w/ sinus infxn.  Due to sxs x1 month will start abx and cough meds.  Reviewed supportive care and  red flags that should prompt return.  Pt expressed understanding and is in agreement w/ plan.   Lumbar radiculopathy- worsening.  Pt is asking for referral to Neurosurg.  Referral entered.

## 2016-05-27 NOTE — Patient Instructions (Signed)
Follow up as needed/scheduled Start the Amoxicillin twice daily for infection REST! Use the cough syrup as needed- will cause drowsiness We'll call you with your Neurosurgery appt Call with any questions or concerns Hang in there!!!

## 2016-05-27 NOTE — Progress Notes (Signed)
Pre visit review using our clinic review tool, if applicable. No additional management support is needed unless otherwise documented below in the visit note. 

## 2016-06-04 ENCOUNTER — Ambulatory Visit (INDEPENDENT_AMBULATORY_CARE_PROVIDER_SITE_OTHER): Payer: Medicare Other | Admitting: Family Medicine

## 2016-06-04 ENCOUNTER — Encounter: Payer: Self-pay | Admitting: Family Medicine

## 2016-06-04 VITALS — BP 126/82 | HR 73 | Temp 98.0°F | Resp 16 | Ht 62.0 in | Wt 233.1 lb

## 2016-06-04 DIAGNOSIS — E038 Other specified hypothyroidism: Secondary | ICD-10-CM

## 2016-06-04 DIAGNOSIS — E1165 Type 2 diabetes mellitus with hyperglycemia: Secondary | ICD-10-CM | POA: Diagnosis not present

## 2016-06-04 DIAGNOSIS — I110 Hypertensive heart disease with heart failure: Secondary | ICD-10-CM | POA: Diagnosis not present

## 2016-06-04 DIAGNOSIS — E1140 Type 2 diabetes mellitus with diabetic neuropathy, unspecified: Secondary | ICD-10-CM | POA: Diagnosis not present

## 2016-06-04 DIAGNOSIS — IMO0002 Reserved for concepts with insufficient information to code with codable children: Secondary | ICD-10-CM

## 2016-06-04 DIAGNOSIS — E785 Hyperlipidemia, unspecified: Secondary | ICD-10-CM | POA: Diagnosis not present

## 2016-06-04 DIAGNOSIS — Z0000 Encounter for general adult medical examination without abnormal findings: Secondary | ICD-10-CM

## 2016-06-04 DIAGNOSIS — E1169 Type 2 diabetes mellitus with other specified complication: Secondary | ICD-10-CM | POA: Diagnosis not present

## 2016-06-04 LAB — CBC WITH DIFFERENTIAL/PLATELET
BASOS PCT: 0.7 % (ref 0.0–3.0)
Basophils Absolute: 0.1 10*3/uL (ref 0.0–0.1)
EOS PCT: 2.6 % (ref 0.0–5.0)
Eosinophils Absolute: 0.3 10*3/uL (ref 0.0–0.7)
HEMATOCRIT: 42 % (ref 36.0–46.0)
HEMOGLOBIN: 13.8 g/dL (ref 12.0–15.0)
LYMPHS PCT: 25.1 % (ref 12.0–46.0)
Lymphs Abs: 2.5 10*3/uL (ref 0.7–4.0)
MCHC: 32.9 g/dL (ref 30.0–36.0)
MCV: 91 fl (ref 78.0–100.0)
MONOS PCT: 5.8 % (ref 3.0–12.0)
Monocytes Absolute: 0.6 10*3/uL (ref 0.1–1.0)
NEUTROS ABS: 6.5 10*3/uL (ref 1.4–7.7)
Neutrophils Relative %: 65.8 % (ref 43.0–77.0)
PLATELETS: 216 10*3/uL (ref 150.0–400.0)
RBC: 4.61 Mil/uL (ref 3.87–5.11)
RDW: 14.7 % (ref 11.5–15.5)
WBC: 9.9 10*3/uL (ref 4.0–10.5)

## 2016-06-04 LAB — HEPATIC FUNCTION PANEL
ALT: 18 U/L (ref 0–35)
AST: 17 U/L (ref 0–37)
Albumin: 4.2 g/dL (ref 3.5–5.2)
Alkaline Phosphatase: 67 U/L (ref 39–117)
BILIRUBIN DIRECT: 0.1 mg/dL (ref 0.0–0.3)
BILIRUBIN TOTAL: 0.3 mg/dL (ref 0.2–1.2)
Total Protein: 7 g/dL (ref 6.0–8.3)

## 2016-06-04 LAB — BASIC METABOLIC PANEL
BUN: 20 mg/dL (ref 6–23)
CALCIUM: 9.6 mg/dL (ref 8.4–10.5)
CO2: 30 meq/L (ref 19–32)
CREATININE: 0.99 mg/dL (ref 0.40–1.20)
Chloride: 108 mEq/L (ref 96–112)
GFR: 59.63 mL/min — ABNORMAL LOW (ref >60.00)
Glucose, Bld: 160 mg/dL — ABNORMAL HIGH (ref 70–99)
Potassium: 5.3 mEq/L — ABNORMAL HIGH (ref 3.5–5.1)
SODIUM: 142 meq/L (ref 135–145)

## 2016-06-04 LAB — LIPID PANEL
Cholesterol: 129 mg/dL (ref 0–200)
HDL: 36.9 mg/dL — ABNORMAL LOW (ref >39.00)
LDL CALC: 59 mg/dL (ref 0–99)
NONHDL: 92.59
Total CHOL/HDL Ratio: 4
Triglycerides: 169 mg/dL — ABNORMAL HIGH (ref 0.0–149.0)
VLDL: 33.8 mg/dL (ref 0.0–40.0)

## 2016-06-04 LAB — HEMOGLOBIN A1C: HEMOGLOBIN A1C: 8 % — AB (ref 4.6–6.5)

## 2016-06-04 LAB — TSH: TSH: 1.3 u[IU]/mL (ref 0.35–4.50)

## 2016-06-04 MED ORDER — CLONAZEPAM 1 MG PO TABS: 1.0000 mg | ORAL_TABLET | Freq: Every day | ORAL | 1 refills | Status: DC

## 2016-06-04 NOTE — Patient Instructions (Signed)
Follow up in 3-4 months to recheck diabetes We'll notify you of your lab results and make any changes if needed You are up to date on colonoscopy, mammo, and immunizations- great job! Continue to work on healthy diet and exercise as you are able [facility] call you with your Neurosurgery appt Call with any questions or concerns Hang in there!!!

## 2016-06-04 NOTE — Progress Notes (Signed)
Pre visit review using our clinic review tool, if applicable. No additional management support is needed unless otherwise documented below in the visit note. 

## 2016-06-04 NOTE — Progress Notes (Signed)
   Subjective:    Patient ID: Lacey [name], female    DOB: 1951-04-30, 66 y.o.   MRN: [id number]  HPI Here today for CPE.  Risk Factors: HTN- chronic problem, well controlled today on Diovan, Metoprolol, Lasix Hyperlipidemia- chronic problem, on Fenofibrate and Lipitor DM- chronic problem, on Jardiance and Tresiba, Humalog,  Obesity- chronic problem, BMI 42.5.  Not able to exercise regularly due to back issues Hypothyroid- chronic problem, on Levothyroxine Physical Activity: very little activity due to back pain Fall Risk: low Depression: denies sxs on Celexa Hearing: normal to conversational tones, decreased to whispered voice ADL's: independent Cognitive: normal linear thought process, memory and attention intact Home Safety: safe at home, lives w/ husband Height, Weight, BMI, Visual Acuity: see vitals, vision corrected to 20/20 w/ glasses Counseling: UTD on eye exam, foot exam, on ARB for renal protection, UTD on colonoscopy, mammo and immunizations Care team reviewed and updated w/ pt Labs Ordered: See A&P Care Plan: See A&P    Review of Systems Patient reports no vision/ hearing changes, adenopathy,fever, weight change,  persistant/recurrent hoarseness , swallowing issues, chest pain, palpitations, edema, persistant/recurrent cough, hemoptysis, dyspnea (rest/exertional/paroxysmal nocturnal), gastrointestinal bleeding (melena, rectal bleeding), abdominal pain, significant heartburn, bowel changes, GU symptoms (dysuria, hematuria, incontinence), Gyn symptoms (abnormal  bleeding, pain),  syncope, focal weakness, memory loss, numbness & tingling, skin/hair/nail changes, abnormal bruising or bleeding, anxiety, or depression.     Objective:   Physical Exam General Appearance:    Alert, cooperative, no distress, appears stated age, obese  Head:    Normocephalic, without obvious abnormality, atraumatic  Eyes:    PERRL, conjunctiva/corneas clear, EOM's intact, fundi    benign, both eyes    Ears:    Normal TM's and external ear canals, both ears  Nose:   Nares normal, septum midline, mucosa normal, no drainage    or sinus tenderness  Throat:   Lips, mucosa, and tongue normal; teeth and gums normal  Neck:   Supple, symmetrical, trachea midline, no adenopathy;    Thyroid: no enlargement/tenderness/nodules  Back:     Symmetric, no curvature, ROM normal, no CVA tenderness  Lungs:     Clear to auscultation bilaterally, respirations unlabored  Chest Wall:    No tenderness or deformity   Heart:    Regular rate and rhythm, S1 and S2 normal, no murmur, rub   or gallop  Breast Exam:    Deferred to mammo  Abdomen:     Soft, non-tender, bowel sounds active all four quadrants,    no masses, no organomegaly  Genitalia:    Deferred  Rectal:    Extremities:   Extremities normal, atraumatic, no cyanosis or edema  Pulses:   2+ and symmetric all extremities  Skin:   Skin color, texture, turgor normal, no rashes or lesions  Lymph nodes:   Cervical, supraclavicular, and axillary nodes normal  Neurologic:   CNII-XII intact, normal strength, sensation and reflexes    throughout          Assessment & Plan:

## 2016-06-19 ENCOUNTER — Encounter: Payer: Self-pay | Admitting: Acute Care

## 2016-06-19 ENCOUNTER — Encounter: Payer: Self-pay | Admitting: Family Medicine

## 2016-06-19 MED ORDER — INSULIN DEGLUDEC 200 UNIT/ML ~~LOC~~ SOPN: 80.0000 [IU] | PEN_INJECTOR | Freq: Every day | SUBCUTANEOUS | 1 refills | Status: DC

## 2016-06-22 NOTE — Assessment & Plan Note (Signed)
Chronic problem.  UTD on foot exam, eye exam.  On ARB for renal protection.  Check labs.  Adjust meds prn  

## 2016-06-22 NOTE — Assessment & Plan Note (Signed)
Pt's PE unchanged from previous and WNL w/ exception of obesity.  UTD on mammo, colonoscopy, immunizations.  Written screening schedule updated and given to pt.  Check labs.  Anticipatory guidance provided.

## 2016-06-22 NOTE — Assessment & Plan Note (Signed)
Chronic problem.  Check labs.  Adjust meds prn  

## 2016-06-22 NOTE — Assessment & Plan Note (Signed)
Chronic problem.  Adequate control today.  Asymptomatic.  Check labs.  No anticipated med changes. 

## 2016-06-22 NOTE — Assessment & Plan Note (Signed)
Chronic problem.  Tolerating statin w/o difficulty.  Check labs.  Adjust meds prn  

## 2016-06-22 NOTE — Assessment & Plan Note (Deleted)
Chronic problem.  UTD on foot exam, eye exa.  On ARB for renal protection.  Check labs.  Adjust meds prn

## 2016-06-22 NOTE — Assessment & Plan Note (Signed)
Ongoing issue.  Pt is not able to exercise due to chronic back issues so stressed need for healthy diet.  Check labs to risk stratify.  Will follow.

## 2016-06-23 DIAGNOSIS — G8929 Other chronic pain: Secondary | ICD-10-CM | POA: Insufficient documentation

## 2016-06-23 DIAGNOSIS — M542 Cervicalgia: Secondary | ICD-10-CM | POA: Insufficient documentation

## 2016-06-24 ENCOUNTER — Telehealth: Payer: Self-pay | Admitting: Family Medicine

## 2016-06-24 NOTE — Telephone Encounter (Signed)
Ok to switch back - same dose

## 2016-06-24 NOTE — Telephone Encounter (Signed)
Ok to change back to Lantus? Is it the same dosage?

## 2016-06-24 NOTE — Telephone Encounter (Signed)
Pt states that lantus is now covered with her plan and would like to go back to this and not use [place], pt asking that this be called into [facility]

## 2016-06-25 MED ORDER — INSULIN GLARGINE 100 UNIT/ML SOLOSTAR PEN: 80.0000 [IU] | PEN_INJECTOR | Freq: Every day | SUBCUTANEOUS | 1 refills | Status: DC

## 2016-06-25 NOTE — Telephone Encounter (Signed)
Medication filled to pharmacy as requested.   

## 2016-07-08 ENCOUNTER — Encounter: Payer: Self-pay | Admitting: Family Medicine

## 2016-07-08 ENCOUNTER — Ambulatory Visit (INDEPENDENT_AMBULATORY_CARE_PROVIDER_SITE_OTHER): Payer: Medicare Other | Admitting: Family Medicine

## 2016-07-08 ENCOUNTER — Emergency Department (HOSPITAL_COMMUNITY)
Admission: EM | Admit: 2016-07-08 | Discharge: 2016-07-08 | Disposition: A | Discharge status: Home / Self Care | Payer: Medicare Other | Attending: Emergency Medicine | Admitting: Emergency Medicine

## 2016-07-08 ENCOUNTER — Emergency Department (HOSPITAL_COMMUNITY): Payer: Medicare Other

## 2016-07-08 ENCOUNTER — Encounter (HOSPITAL_COMMUNITY): Payer: Self-pay

## 2016-07-08 VITALS — BP 130/66 | HR 109 | Temp 99.4°F | Resp 14 | Ht 62.0 in | Wt 235.0 lb

## 2016-07-08 DIAGNOSIS — I5032 Chronic diastolic (congestive) heart failure: Secondary | ICD-10-CM | POA: Diagnosis not present

## 2016-07-08 DIAGNOSIS — M549 Dorsalgia, unspecified: Secondary | ICD-10-CM | POA: Diagnosis not present

## 2016-07-08 DIAGNOSIS — F17210 Nicotine dependence, cigarettes, uncomplicated: Secondary | ICD-10-CM | POA: Diagnosis not present

## 2016-07-08 DIAGNOSIS — R6889 Other general symptoms and signs: Secondary | ICD-10-CM

## 2016-07-08 DIAGNOSIS — E1122 Type 2 diabetes mellitus with diabetic chronic kidney disease: Secondary | ICD-10-CM | POA: Diagnosis not present

## 2016-07-08 DIAGNOSIS — N189 Chronic kidney disease, unspecified: Secondary | ICD-10-CM | POA: Insufficient documentation

## 2016-07-08 DIAGNOSIS — I130 Hypertensive heart and chronic kidney disease with heart failure and stage 1 through stage 4 chronic kidney disease, or unspecified chronic kidney disease: Secondary | ICD-10-CM | POA: Insufficient documentation

## 2016-07-08 DIAGNOSIS — R109 Unspecified abdominal pain: Secondary | ICD-10-CM

## 2016-07-08 DIAGNOSIS — R81 Glycosuria: Secondary | ICD-10-CM | POA: Diagnosis not present

## 2016-07-08 DIAGNOSIS — Z794 Long term (current) use of insulin: Secondary | ICD-10-CM | POA: Diagnosis not present

## 2016-07-08 DIAGNOSIS — E1140 Type 2 diabetes mellitus with diabetic neuropathy, unspecified: Secondary | ICD-10-CM | POA: Diagnosis not present

## 2016-07-08 DIAGNOSIS — R8299 Other abnormal findings in urine: Secondary | ICD-10-CM | POA: Diagnosis not present

## 2016-07-08 DIAGNOSIS — R319 Hematuria, unspecified: Secondary | ICD-10-CM

## 2016-07-08 DIAGNOSIS — N12 Tubulo-interstitial nephritis, not specified as acute or chronic: Secondary | ICD-10-CM | POA: Diagnosis not present

## 2016-07-08 DIAGNOSIS — Z79899 Other long term (current) drug therapy: Secondary | ICD-10-CM | POA: Diagnosis not present

## 2016-07-08 DIAGNOSIS — R82998 Other abnormal findings in urine: Secondary | ICD-10-CM

## 2016-07-08 DIAGNOSIS — E039 Hypothyroidism, unspecified: Secondary | ICD-10-CM | POA: Insufficient documentation

## 2016-07-08 DIAGNOSIS — Z7982 Long term (current) use of aspirin: Secondary | ICD-10-CM | POA: Diagnosis not present

## 2016-07-08 DIAGNOSIS — R112 Nausea with vomiting, unspecified: Secondary | ICD-10-CM

## 2016-07-08 LAB — URINALYSIS, ROUTINE W REFLEX MICROSCOPIC
Bilirubin Urine: NEGATIVE
Glucose, UA: >=500 mg/dL — AB
Ketones, ur: NEGATIVE mg/dL
Nitrite: POSITIVE — AB
PH: 6 (ref 5.0–8.0)
Protein, ur: 30 mg/dL — AB
SPECIFIC GRAVITY, URINE: 1.017 (ref 1.005–1.030)

## 2016-07-08 LAB — COMPREHENSIVE METABOLIC PANEL
ALK PHOS: 70 U/L (ref 38–126)
ALT: 29 U/L (ref 14–54)
ANION GAP: 9 (ref 5–15)
AST: 40 U/L (ref 15–41)
Albumin: 3.8 g/dL (ref 3.5–5.0)
BILIRUBIN TOTAL: 0.6 mg/dL (ref 0.3–1.2)
BUN: 24 mg/dL — ABNORMAL HIGH (ref 6–20)
CALCIUM: 8.9 mg/dL (ref 8.9–10.3)
CO2: 22 mmol/L (ref 22–32)
Chloride: 105 mmol/L (ref 101–111)
Creatinine, Ser: 1.04 mg/dL — ABNORMAL HIGH (ref 0.44–1.00)
GFR calc Af Amer: >60 mL/min (ref >60)
GFR, EST NON AFRICAN AMERICAN: 55 mL/min — AB (ref >60)
Glucose, Bld: 195 mg/dL — ABNORMAL HIGH (ref 65–99)
POTASSIUM: 3.9 mmol/L (ref 3.5–5.1)
Sodium: 136 mmol/L (ref 135–145)
TOTAL PROTEIN: 7 g/dL (ref 6.5–8.1)

## 2016-07-08 LAB — POCT URINALYSIS DIPSTICK
Bilirubin, UA: NEGATIVE
Ketones, UA: NEGATIVE
SPEC GRAV UA: 1.01
Urobilinogen, UA: 0.2
pH, UA: 6

## 2016-07-08 LAB — CBC WITH DIFFERENTIAL/PLATELET
Basophils Absolute: 0 10*3/uL (ref 0.0–0.1)
Basophils Relative: 0 %
Eosinophils Absolute: 0 10*3/uL (ref 0.0–0.7)
Eosinophils Relative: 0 %
HEMATOCRIT: 38.1 % (ref 36.0–46.0)
Hemoglobin: 12.4 g/dL (ref 12.0–15.0)
LYMPHS ABS: 1 10*3/uL (ref 0.7–4.0)
LYMPHS PCT: 8 %
MCH: 29.5 pg (ref 26.0–34.0)
MCHC: 32.5 g/dL (ref 30.0–36.0)
MCV: 90.7 fL (ref 78.0–100.0)
MONO ABS: 1.2 10*3/uL — AB (ref 0.1–1.0)
MONOS PCT: 10 %
NEUTROS ABS: 9.8 10*3/uL — AB (ref 1.7–7.7)
Neutrophils Relative %: 82 %
Platelets: 133 10*3/uL — ABNORMAL LOW (ref 150–400)
RBC: 4.2 MIL/uL (ref 3.87–5.11)
RDW: 14.8 % (ref 11.5–15.5)
WBC: 12 10*3/uL — ABNORMAL HIGH (ref 4.0–10.5)

## 2016-07-08 LAB — LIPASE, BLOOD: Lipase: 22 U/L (ref 11–51)

## 2016-07-08 LAB — PROTIME-INR
INR: 1.09
PROTHROMBIN TIME: 14.1 s (ref 11.4–15.2)

## 2016-07-08 LAB — POCT INFLUENZA A: Rapid Influenza A Ag: NEGATIVE

## 2016-07-08 LAB — I-STAT CG4 LACTIC ACID, ED: LACTIC ACID, VENOUS: 0.62 mmol/L (ref 0.5–1.9)

## 2016-07-08 MED ORDER — MORPHINE SULFATE (PF) 4 MG/ML IV SOLN
4.0000 mg | Freq: Once | INTRAVENOUS | Status: AC
  Administered 2016-07-08: 4 mg via INTRAVENOUS
  Filled 2016-07-08: qty 1

## 2016-07-08 MED ORDER — LEVOFLOXACIN 750 MG PO TABS: 750.0000 mg | ORAL_TABLET | Freq: Every day | ORAL | 0 refills | Status: AC

## 2016-07-08 MED ORDER — ONDANSETRON HCL 4 MG/2ML IJ SOLN
4.0000 mg | Freq: Once | INTRAMUSCULAR | Status: AC
  Administered 2016-07-08: 4 mg via INTRAVENOUS
  Filled 2016-07-08: qty 2

## 2016-07-08 MED ORDER — ONDANSETRON HCL 4 MG PO TABS
4.0000 mg | ORAL_TABLET | Freq: Three times a day (TID) | ORAL | 0 refills | Status: DC | PRN
Start: 2016-07-08 — End: 2021-11-27

## 2016-07-08 MED ORDER — SODIUM CHLORIDE 0.9 % IV BOLUS (SEPSIS)
1000.0000 mL | Freq: Once | INTRAVENOUS | Status: AC
  Administered 2016-07-08: 1000 mL via INTRAVENOUS

## 2016-07-08 MED ORDER — HYDROCODONE-ACETAMINOPHEN 5-325 MG PO TABS
1.0000 | ORAL_TABLET | ORAL | 0 refills | Status: DC | PRN
Start: 2016-07-08 — End: 2019-04-03

## 2016-07-08 MED ORDER — DEXTROSE 5 % IV SOLN
1.0000 g | Freq: Once | INTRAVENOUS | Status: AC
  Administered 2016-07-08: 1 g via INTRAVENOUS
  Filled 2016-07-08: qty 10

## 2016-07-08 NOTE — ED Triage Notes (Signed)
Pt from MD office.  Pt c/o flank pain with low grade temp and chills.  Told to come to ED.  Urine "did not look good" per patient.

## 2016-07-08 NOTE — Patient Instructions (Signed)
GO TO [place] ER- they are expecting you

## 2016-07-08 NOTE — Progress Notes (Signed)
   Subjective:    Patient ID: Lacey [name], female    DOB: 1950-10-08, 66 y.o.   MRN: [id number]  HPI Flank pain- sxs started at 3:30am, sxs started suddenly w/ chills, shaking.  L flank pain.  No dysuria.  Pt has hx of kidney stones and this feels similar.  Pt has hx of pyelonephritis.  + nausea, no vomiting.  Pt sees Dr Karsten Ro.   Review of Systems For ROS see HPI     Objective:   Physical Exam  Constitutional: She is oriented to person, place, and time. She appears well-developed and well-nourished. She appears distressed (pt is rigoring in exam room).  HENT:  Head: Normocephalic and atraumatic.  Abdominal:  Pt w/ L flank pain, radiating anteriorly into abdomen  Neurological: She is alert and oriented to person, place, and time.  Skin: Skin is warm and dry.  Psychiatric: She has a normal mood and affect. Her behavior is normal. Thought content normal.  Vitals reviewed.         Assessment & Plan:  Pyelonephritis- new.  Pt's acute onset, fevers, chills, and UA are all consistent w/ severe infxn.  Pt has hx of obstructing stones that caused pyelo in the past.  Concern for similar today.  I am not able to send her home in her current condition despite her reluctance to go to the ER.  Pt finally agreed to go to ER- I called ahead to give report.  Pt expressed understanding and is in agreement w/ plan.

## 2016-07-08 NOTE — Discharge Instructions (Signed)
We found that you have a urinary tract infection tonight and given the pain in your flank, we suspect is a pyelonephritis. You were given IV antibiotics tonight and will be discharged with oral antibiotics. He'll be given pain medicine and nausea medicine prescriptions as well. Please schedule appointment to follow-up with your primary physician in the next several days and if any symptoms worsen, please return to the nearest emergency department.

## 2016-07-08 NOTE — Progress Notes (Signed)
Pre visit review using our clinic review tool, if applicable. No additional management support is needed unless otherwise documented below in the visit note. 

## 2016-07-08 NOTE — ED Provider Notes (Signed)
[facility] DEPT Provider Note   CSN: [id number] Arrival date & time: 07/08/16  1511     History   Chief Complaint Chief Complaint  Patient presents with  . Flank Pain    HPI Lacey [name] is a 66 y.o. female with a past medical history significant for chronic kidney disease, nephrolithiasis, hypertension, hyperlipidemia, diabetes, and prior diverticulosis who presents from her PCP office for severe left-sided flank pain, fevers, nausea and intolerance of oral intake. Patient is currently by her husband. Patient reports she was feeling normal yesterday however she woke up at 3:30 AM this morning with severe left-sided flank pain. She says it feels like the last time she had a large kidney stone with infection. She reports episodes of kidney stones requiring stenting. She says that her urine looked darker but she did not having dysuria today. She reports some mild constipation but denies diarrhea. She denies any upper history symptoms of a dry cough. She does report a fever of 100.6 this morning at home. She reports nausea and has not been able to eat or drink anything today. She denies vomiting. She has not taken any medicine today.   She reports a home health nurse came out today and after seeing her, recommend she see her PCP. After going to PCPs office, she was sent directly to the ED for concern for pyelonephritis or infected kidney stone.       HPI  Past Medical History:  Diagnosis Date  . Adenomatous colon polyp    hyperplastic  . Anginal pain ([place])    not recent  . Arthritis    NECK, KNEES, FINGERS, TOES  . Atrial tachycardia ([place]) CARDIOLOGIST - DR Caryl Comes (LAST VISIT AUG 2012)   Echo 12/11: EF 55-60%, Mild LVH, grade 1 diast dysfxn;   holter 1/12: ATach  . Atypical chest pain    a. 07/2004 Cath: Clean cors;  b. 04/2010 Myoview: EF 63%, no ischemia  . Blood transfusion without reported diagnosis 1969  . Chronic kidney disease   . Diabetes mellitus, type 2 (HCC)    ORAL  AND INSULIN MEDS (LAST A1C  7.3)  . Diverticulosis   . Erythema CURRENT-- CLOSED ABD. WALL ABSCESS   PER PCP NOTE (03-31-11)- MRSA-- TAKES DOXYCYCLINE  . GERD (gastroesophageal reflux disease)    CONTROLLED W/ PREVACID  . History of kidney stones 2012  . Hyperlipidemia   . Hypertension   . Insomnia    TAKES MEDS PRN  . Neuropathy, peripheral ([place])   . Obesity (BMI 30-39.9) 02/19/2015  . Pneumonia    as child  . Post op infection 11/07/12   left bunionectomy  . Restless leg syndrome   . Sepsis, urinary HISTORY - 2004  . Sleep apnea   . Vitamin D deficiency     Patient Active Problem List   Diagnosis Date Noted  . Fever, unspecified 09/02/2015  . Radicular low back pain 07/12/2015  . Uncontrolled type 2 diabetes mellitus with retinopathy without macular edema ([place]) 04/24/2015  . Left breast abscess 03/01/2015  . Severe obesity (BMI >= 40) ([place]) 02/19/2015  . OSA (obstructive sleep apnea) 10/23/2014  . Excessive daytime sleepiness 10/23/2014  . Hypothyroidism 09/07/2014  . Hot flashes 08/03/2014  . Posterior right knee pain 07/20/2014  . Fall from slipping on ice 04/12/2014  . Head injury 04/12/2014  . Tobacco abuse 01/15/2014  . Recurrent UTI 09/06/2013  . Skin lesion of face 09/06/2013  . Gastroenteritis 07/20/2013  . Postoperative wound infection 11/21/2012  .  Hematuria, gross 11/18/2012  . PND (post-nasal drip) 10/05/2012  . Orthostatic hypotension 10/05/2012  . Otitis media 02/01/2012  . Bronchitis, acute 02/01/2012  . Foot pain 11/20/2011  . Atypical chest pain   . General medical examination 06/22/2011  . Neuropathic pain of lower extremity 06/22/2011  . Ear cysts 03/20/2011  . Paresthesias/numbness 03/20/2011  . Abdominal wall abscess 03/20/2011  . Atrial tachycardia ([place]) 11/25/2010  . Shortness of breath 11/25/2010  . Pre-operative cardiovascular examination 11/25/2010  . Chronic diarrhea 08/25/2010  . KNEE PAIN 07/14/2010  . DIASTOLIC HEART FAILURE,  CHRONIC 06/09/2010  . CHANGE IN BOWELS 05/01/2010  . PERSONAL HISTORY OF COLONIC POLYPS 05/01/2010  . VITAMIN D DEFICIENCY 01/20/2010  . RESTLESS LEG SYNDROME 01/20/2010  . INSOMNIA 01/20/2010  . DYSURIA 05/30/2009  . THUMB PAIN, RIGHT 02/05/2009  . CHEST PAIN 02/05/2009  . Type 2 diabetes, uncontrolled, with neuropathy ([place]) 01/08/2009  . Hyperlipidemia associated with type 2 diabetes mellitus ([place]) 01/08/2009  . ANEMIA 01/08/2009  . GERD 01/08/2009  . Benign hypertensive heart disease with heart failure ([place]) 01/08/2009    Past Surgical History:  Procedure Laterality Date  . ABDOMINAL HYSTERECTOMY  1995   ovaries remain  . BUNIONECTOMY Left 10/24/12  . CARDIAC CATHETERIZATION  07/10/04  . CARPAL TUNNEL RELEASE  2000   RIGHT  . CERVICAL FUSION  10-12-07   C5 - 7  . COLONOSCOPY    . CYSTOSCOPY WITH RETROGRADE PYELOGRAM, URETEROSCOPY AND STENT PLACEMENT Left 11/28/2012   Procedure: LEFT RETROGRADE PYELOGRAM, LEFT URETEROSCOPY, ;  Surgeon: Claybon Jabs, MD;  Location: [facility];  Service: Urology;  Laterality: Left;  . CYSTOSCOPY/RETROGRADE/URETEROSCOPY/STONE EXTRACTION WITH BASKET  X2 2004 & 2009   LEFT   . GASTRIC BYPASS  1981  . KNEE ARTHROSCOPY  AUG 2012   LEFT  . LAPAROSCOPIC CHOLECYSTECTOMY  2001  . left thumb joint surgery  2013  . PERCUTANEOUS NEPHROSTOLITHOTOMY  02-27-11   LEFT  . POLYPECTOMY    . RIGHT THUMB JOINT SURG.   MAY 2011  . TRIGGER FINGER RELEASE  2010   RIGHT THUMB  . UPPER GASTROINTESTINAL ENDOSCOPY    . URETERAL STENT PLACEMENT  X2  2004  &  2009   LEFT  . URETEROSCOPY  04/06/2011   Procedure: URETEROSCOPY;  Surgeon: Claybon Jabs, MD;  Location[facility];  Service: Urology;  Laterality: Left;  L Ureteroscopy Laser Litho & Stent     OB History    No data available       Home Medications    Prior to Admission medications   Medication Sig Start Date End Date Taking? Authorizing Provider  aspirin EC 81 MG tablet Take 81 mg by  mouth daily.    Historical Provider, MD  atorvastatin (LIPITOR) 20 MG tablet Take 1 tablet (20 mg total) by mouth daily. 05/25/16   Midge Minium, MD  celecoxib (CELEBREX) 200 MG capsule Take 200 mg by mouth every morning.    Historical Provider, MD  citalopram (CELEXA) 20 MG tablet TAKE 1 TABLET DAILY 02/05/16   Midge Minium, MD  clonazePAM (KLONOPIN) 1 MG tablet Take 1 tablet (1 mg total) by mouth at bedtime. 1 tab nightly for restless leg. 06/04/16   Midge Minium, MD  clotrimazole-betamethasone (LOTRISONE) cream Apply 1 application topically 2 (two) times daily. 09/06/13   Midge Minium, MD  diphenoxylate-atropine (LOMOTIL) 2.5-0.025 MG per tablet Take 1 tablet by mouth 4 (four) times daily as needed for diarrhea or  loose stools. 07/20/13   Midge Minium, MD  empagliflozin (JARDIANCE) 10 MG TABS tablet Take 10 mg by mouth daily. 05/25/16   Midge Minium, MD  fenofibrate 160 MG tablet Take 160 mg by mouth daily.    Historical Provider, MD  furosemide (LASIX) 20 MG tablet Take 1 tablet (20 mg total) by mouth daily. 11/06/15   Deboraha Sprang, MD  gabapentin (NEURONTIN) 300 MG capsule Take 1 capsule (300 mg total) by mouth daily. 05/22/16   Midge Minium, MD  hydrocortisone valerate cream (WESTCORT) 0.2 % Apply 1 application topically as needed (for skin irritation).    Historical Provider, MD  Insulin Glargine (LANTUS SOLOSTAR) 100 UNIT/ML Solostar Pen Inject 80 Units into the skin daily at 10 pm. 06/25/16   Midge Minium, MD  insulin lispro (HUMALOG KWIKPEN) 100 UNIT/ML KiwkPen Inject 0.35 mLs (35 Units total) into the skin 3 (three) times daily. 05/22/16   Midge Minium, MD  Insulin Pen Needle (B-D ULTRAFINE III SHORT PEN) 31G X 8 MM MISC USE FOUR TIMES A DAY 03/11/16   Midge Minium, MD  lansoprazole (PREVACID) 15 MG capsule Take 15 mg by mouth daily.    Historical Provider, MD  levothyroxine (SYNTHROID, LEVOTHROID) 50 MCG tablet Take 1 tablet (50 mcg total)  by mouth daily before breakfast. 05/25/16   Midge Minium, MD  loperamide (IMODIUM) 2 MG capsule Take 2 mg by mouth 4 (four) times daily as needed for diarrhea or loose stools.    Historical Provider, MD  metoprolol succinate (TOPROL-XL) 100 MG 24 hr tablet Take 50 mg by mouth twice a day 10/23/15   Deboraha Sprang, MD  nitrofurantoin, macrocrystal-monohydrate, (MACROBID) 100 MG capsule Take 100 mg by mouth as needed (for urinary tract infection). Take 1 capsule by mouth twice daily for 7 days    Historical Provider, MD  ondansetron (ZOFRAN) 4 MG tablet Take 1 tablet (4 mg total) by mouth every 8 (eight) hours as needed for nausea or vomiting. 09/02/15   Midge Minium, MD  [facility] DELICA LANCETS 37C MISC USE FOUR TIMES A DAY 09/24/14   Midge Minium, MD  [facility] VERIO test strip Use on e strip each time sugars are tested, pt tests 4 times daily. Dx. H88.50 12/20/15   Midge Minium, MD  Oxycodone HCl 10 MG TABS TAKE 1-2 TABLETS EVERY 4 HOURS 04/23/16   Historical Provider, MD  oxyCODONE-acetaminophen (PERCOCET/ROXICET) 5-325 MG per tablet 1 tablet every 6 (six) hours as needed (for pain). Reported on 09/20/2015 07/26/14   Historical Provider, MD  traZODone (DESYREL) 50 MG tablet Take 1 tablet (50 mg total) by mouth at bedtime as needed for sleep. 05/25/16   Midge Minium, MD  valsartan (DIOVAN) 320 MG tablet Take 1 tablet (320 mg total) by mouth daily. 05/13/16   Midge Minium, MD    Family History Family History  Problem Relation Age of Onset  . Heart attack Father   . Hyperlipidemia Mother   . Hypertension Mother   . Heart disease Sister   . Hypertension Sister   . Colon polyps Sister   . Hypertension Sister   . Colon polyps Sister   . Hypertension Sister   . Hypertension Sister   . Lung cancer Sister 1    stage 4   . Diabetes    . Breast cancer    . Heart disease    . Colon cancer Other 81    nephew  .  Esophageal cancer Neg Hx   . Rectal cancer Neg Hx   .  Stomach cancer Neg Hx     Social History Social History  Substance Use Topics  . Smoking status: Current Every Day Smoker    Packs/day: 0.25    Years: 43.00    Types: Cigarettes  . Smokeless tobacco: Never Used     Comment: 0.75ppd x67yrs, 0.25ppd x71yr (07/12/15). Counseled to quit -smoking, nicotine replacement and smoking cessation classes discussed-3-4 a day as of 09-06-15  . Alcohol use No     Allergies   Patient has no known allergies.   Review of Systems Review of Systems  Constitutional: Positive for appetite change, chills and fever. Negative for activity change, diaphoresis and fatigue.  HENT: Negative for congestion and rhinorrhea.   Eyes: Negative for visual disturbance.  Respiratory: Negative for cough, chest tightness, shortness of breath, wheezing and stridor.   Cardiovascular: Negative for chest pain, palpitations and leg swelling.  Gastrointestinal: Positive for abdominal pain, constipation and nausea. Negative for abdominal distention, diarrhea and vomiting.  Genitourinary: Positive for flank pain. Negative for difficulty urinating, dysuria, frequency, hematuria and pelvic pain.  Musculoskeletal: Positive for back pain. Negative for neck pain and neck stiffness.  Skin: Negative for rash and wound.  Neurological: Negative for dizziness, weakness, light-headedness, numbness and headaches.  Psychiatric/Behavioral: Negative for agitation and confusion.  All other systems reviewed and are negative.    Physical Exam Updated Vital Signs BP 153/55 (BP Location: Left Arm)   Pulse 98   Temp 98.4 F (36.9 C) (Oral)   Resp 20   LMP 06/06/1993   SpO2 99%   Physical Exam  Constitutional: She is oriented to person, place, and time. She appears well-developed and well-nourished. No distress.  HENT:  Head: Normocephalic and atraumatic.  Right Ear: External ear normal.  Left Ear: External ear normal.  Nose: Nose normal.  Mouth/Throat: Oropharynx is clear and moist.  No oropharyngeal exudate.  Eyes: Conjunctivae and EOM are normal. Pupils are equal, round, and reactive to light.  Neck: Normal range of motion. Neck supple.  Cardiovascular: Normal heart sounds and intact distal pulses.  Tachycardia present.   No murmur heard. Pulmonary/Chest: Effort normal. No stridor. No respiratory distress. She has no wheezes. She exhibits no tenderness.  Abdominal: Soft. Normal appearance. She exhibits no distension. There is tenderness. There is CVA tenderness. There is no rigidity and no rebound.    Musculoskeletal: She exhibits tenderness.       Thoracic back: She exhibits tenderness and pain.       Back:  Neurological: She is alert and oriented to person, place, and time. She has normal reflexes. No sensory deficit. She exhibits normal muscle tone.  Skin: Skin is warm. Capillary refill takes less than 2 seconds. No rash noted. She is not diaphoretic. No erythema.  Psychiatric: She has a normal mood and affect.  Nursing note and vitals reviewed.    ED Treatments / Results  Labs (all labs ordered are listed, but only abnormal results are displayed) Labs Reviewed  URINALYSIS, ROUTINE W REFLEX MICROSCOPIC - Abnormal; Notable for the following:       Result Value   APPearance HAZY (*)    Glucose, UA >=500 (*)    Hgb urine dipstick MODERATE (*)    Protein, ur 30 (*)    Nitrite POSITIVE (*)    Leukocytes, UA MODERATE (*)    Bacteria, UA MANY (*)    Squamous Epithelial / LPF 0-5 (*)  All other components within normal limits  CBC WITH DIFFERENTIAL/PLATELET - Abnormal; Notable for the following:    WBC 12.0 (*)    Platelets 133 (*)    Neutro Abs 9.8 (*)    Monocytes Absolute 1.2 (*)    All other components within normal limits  COMPREHENSIVE METABOLIC PANEL - Abnormal; Notable for the following:    Glucose, Bld 195 (*)    BUN 24 (*)    Creatinine, Ser 1.04 (*)    GFR calc non Af Amer 55 (*)    All other components within normal limits  URINE CULTURE    LIPASE, BLOOD  PROTIME-INR  I-STAT CG4 LACTIC ACID, ED    EKG  EKG Interpretation None       Radiology Ct Renal Stone Study  Result Date: 07/08/2016 CLINICAL DATA:  Left-sided flank pain EXAM: CT ABDOMEN AND PELVIS WITHOUT CONTRAST TECHNIQUE: Multidetector CT imaging of the abdomen and pelvis was performed following the standard protocol without IV contrast. COMPARISON:  05/12/2016, 10/31/2014 FINDINGS: Lower chest: Lung bases demonstrate no acute infiltrate or effusion. The heart is non enlarged. Hepatobiliary: No focal liver abnormality is seen. Status post cholecystectomy. No biliary dilatation. Pancreas: Unremarkable. No pancreatic ductal dilatation or surrounding inflammatory changes. Spleen: No focal abnormality.  Borderline to slightly enlarged. Adrenals/Urinary Tract: Adrenal glands are within normal limits. Nonspecific perinephric fat stranding. No hydronephrosis on the right. Atrophic left kidney with cortical scarring. Multiple calcifications in the left kidney, less than 5 mm in size, these appear parenchymal. Stable exophytic 1 point 3 cm hypodense lesion posterior mid kidney. Prominent left extrarenal pelvis. No definite calcified stone along the course of left ureter. The bladder is unremarkable. Stomach/Bowel: Postsurgical changes of the stomach. No dilated small bowel. Appendix normal. No colon wall thickening. Vascular/Lymphatic: Aortic atherosclerosis. No enlarged abdominal or pelvic lymph nodes. Reproductive: Uterus and bilateral adnexa are unremarkable. Other: No free air or free fluid. Musculoskeletal: Mild retrolisthesis of L4 on L5. Multilevel degenerative disc changes. No acute or suspicious bone lesions. IMPRESSION: 1. Negative for hydronephrosis or ureteral stone. 2. Atrophic left kidney with multiple scattered calcifications many of which appear parenchymal. 3. Borderline enlarged spleen Electronically Signed   By: Donavan Foil M.D.   On: 07/08/2016 17:26     Procedures Procedures (including critical care time)  Medications Ordered in ED Medications  morphine 4 MG/ML injection 4 mg (4 mg Intravenous Given 07/08/16 1736)  ondansetron (ZOFRAN) injection 4 mg (4 mg Intravenous Given 07/08/16 1736)  sodium chloride 0.9 % bolus 1,000 mL (0 mLs Intravenous Stopped 07/08/16 1850)  cefTRIAXone (ROCEPHIN) 1 g in dextrose 5 % 50 mL IVPB (0 g Intravenous Stopped 07/08/16 2041)     Initial Impression / Assessment and Plan / ED Course  I have reviewed the triage vital signs and the nursing notes.  Pertinent labs & imaging results that were available during my care of the patient were reviewed by me and considered in my medical decision making (see chart for details).     Lacey [name] is a 66 y.o. female with a past medical history significant for chronic kidney disease, nephrolithiasis, hypertension, hyperlipidemia, diabetes, and prior diverticulosis who presents from her PCP office for severe left-sided flank pain, fevers, nausea and intolerance of oral intake.  History and exam are seen above.  On exam, patient has severe left-sided CVA tenderness worse than right. Patient has left-sided flank tenderness. Patient's abdomen is otherwise in remarkable and nontender. Patient's lungs are clear. No significant lower extremity edema.  Given patient's history of multiple kidney stones and infections, patient was worked up for nephrolithiasis. Patient also have urinalysis and laboratory testing to look for sepsis. Patient given pain medicine, nausea medicine, and fluids.  Diagnostic laboratory testing results are seen above. Patient found to have slight leukocytosis of 12.0. Normal hemoglobin. CMP showed creatinine of 1.04. Lipase not elevated. Urinalysis confirmed urinary tract infection. Given the flank pain and history of pyelonephritis, patient will be treated for pyelonephritis.   Even her history of significant kidney stones, CT scan ordered to look for  infected stone. CT scan showed no evidence of infected stone or hydronephrosis.  Patient given dose of Rocephin in the emergency room. Patient is able to tolerate food and fluids. Patient reports feeling better after medications. Patient does not want to be admitted for this pyelonephritis. Patient understands risks of worsening condition for outpatient management however, patient wants to try oral antibiotics.   Patient will be given prescription for levofloxacin as well as pain and nausea medicine. Patient will follow up with her PCP in the next 24-48 hours. Patient was given strict return precautions for any new or worsening symptoms. Patient had no other questions or concerns and felt much better. Patient discharged in good condition.   Final Clinical Impressions(s) / ED Diagnoses   Final diagnoses:  Pyelonephritis  Left flank pain  Non-intractable vomiting with nausea, unspecified vomiting type    New Prescriptions Discharge Medication List as of 07/08/2016  9:02 PM    START taking these medications   Details  HYDROcodone-acetaminophen (NORCO/VICODIN) 5-325 MG tablet Take 1 tablet by mouth every 4 (four) hours as needed., Starting Wed 07/08/2016, Print    levofloxacin (LEVAQUIN) 750 MG tablet Take 1 tablet (750 mg total) by mouth daily., Starting Wed 07/08/2016, Until Mon 07/13/2016, Print    !! ondansetron (ZOFRAN) 4 MG tablet Take 1 tablet (4 mg total) by mouth every 8 (eight) hours as needed for nausea or vomiting., Starting Wed 07/08/2016, Print     !! - Potential duplicate medications found. Please discuss with provider.      Clinical Impression: 1. Pyelonephritis   2. Left flank pain   3. Non-intractable vomiting with nausea, unspecified vomiting type     Disposition: Discharge  Condition: Good  I have discussed the results, Dx and Tx plan with the pt(& family if present). He/she/they expressed understanding and agree(s) with the plan. Discharge instructions discussed at  great length. Strict return precautions discussed and pt &/or family have verbalized understanding of the instructions. No further questions at time of discharge.    Discharge Medication List as of 07/08/2016  9:02 PM    START taking these medications   Details  HYDROcodone-acetaminophen (NORCO/VICODIN) 5-325 MG tablet Take 1 tablet by mouth every 4 (four) hours as needed., Starting Wed 07/08/2016, Print    levofloxacin (LEVAQUIN) 750 MG tablet Take 1 tablet (750 mg total) by mouth daily., Starting Wed 07/08/2016, Until Mon 07/13/2016, Print    !! ondansetron (ZOFRAN) 4 MG tablet Take 1 tablet (4 mg total) by mouth every 8 (eight) hours as needed for nausea or vomiting., Starting Wed 07/08/2016, Print     !! - Potential duplicate medications found. Please discuss with provider.      Follow Up: Midge Minium, MD [address] [place] [address] [phone]  Schedule an appointment as soon as possible for a visit    [facility]PT [place] [id number] [place]  [phone]  If symptoms worsen       Courtney Paris, MD 07/09/16 [phone]

## 2016-07-10 LAB — URINE CULTURE

## 2016-07-11 LAB — URINE CULTURE: Culture: >=100000 — AB

## 2016-07-12 ENCOUNTER — Telehealth: Payer: Self-pay

## 2016-07-12 NOTE — Telephone Encounter (Signed)
Post ED Visit - Positive Culture Follow-up  Culture report reviewed by antimicrobial stewardship pharmacist:  []  Elenor Quinones, Pharm.D. []  [name], Pharm.D., BCPS []  Parks Neptune, Pharm.D. []  Alycia Rossetti, Pharm.D., BCPS []  [place], Pharm.D., BCPS, AAHIVP []  Legrand Como, Pharm.D., BCPS, AAHIVP []  Milus Glazier, Pharm.D. []  Rob Evette Doffing, Pharm.D. [employer] Pharm D Positive urine culture Treated with Levofloxacin, organism sensitive to the same and no further patient follow-up is required at this time.  Genia Del 07/12/2016, 9:19 AM

## 2016-07-16 ENCOUNTER — Encounter: Payer: Self-pay | Admitting: Family Medicine

## 2016-07-16 ENCOUNTER — Ambulatory Visit (INDEPENDENT_AMBULATORY_CARE_PROVIDER_SITE_OTHER): Payer: Medicare Other | Admitting: Family Medicine

## 2016-07-16 VITALS — BP 122/80 | HR 60 | Temp 97.9°F | Resp 17 | Ht 62.0 in | Wt 231.4 lb

## 2016-07-16 DIAGNOSIS — N12 Tubulo-interstitial nephritis, not specified as acute or chronic: Secondary | ICD-10-CM | POA: Diagnosis not present

## 2016-07-16 MED ORDER — LEVOFLOXACIN 500 MG PO TABS: 500.0000 mg | ORAL_TABLET | Freq: Every day | ORAL | 0 refills | Status: DC

## 2016-07-16 NOTE — Patient Instructions (Signed)
Follow up by phone or MyChart next week and let me know how you're doing We're going to restart antibiotics at a lower dose (500mg  daily x5 days) Drink plenty of fluids Call with any questions or concerns Hang in there!!!

## 2016-07-16 NOTE — Progress Notes (Signed)
Pre visit review using our clinic review tool, if applicable. No additional management support is needed unless otherwise documented below in the visit note. 

## 2016-07-16 NOTE — Progress Notes (Signed)
   Subjective:    Patient ID: Lacey [name], female    DOB: 1951-01-28, 66 y.o.   MRN: [id number]  HPI ER f/u- pt was seen 3/7 for pyelonephritis.  Pt was given Rocephin in ER and d/c'd w/ script for Levaquin.  Hospital wanted to admit pt but after pain meds, she declined admission.  Pt has completed abx and reports feeling much better.  Pt reports she is still having L kidney pain.  No burning w/ urination, no discoloration or odor.  No longer having fevers   Review of Systems For ROS see HPI     Objective:   Physical Exam  Constitutional: She is oriented to person, place, and time. She appears well-developed and well-nourished. No distress.  HENT:  Head: Normocephalic and atraumatic.  Abdominal: Soft. Bowel sounds are normal. She exhibits no distension. There is tenderness (no abdominal TTP but pt continues to have L CVA pain).  Neurological: She is alert and oriented to person, place, and time.  Skin: Skin is warm and dry.  Psychiatric: She has a normal mood and affect. Her behavior is normal. Thought content normal.  Vitals reviewed.         Assessment & Plan:  Pyelonephritis- new.  Pt was sent to ER on 3/7 for pyelonephritis where she was given 1g Rocephin, IV fluids, pain meds.  Reviewed CT results.  Pt was d/c'd home on 750mg  Levaquin x5 days and completed course on Monday.  Pt continues to have L CVA pain.  Will extend abx using Levaquin 500mg  x5 additional days.  Reviewed supportive care and red flags that should prompt return.  Pt expressed understanding and is in agreement w/ plan.

## 2016-07-17 ENCOUNTER — Telehealth: Payer: Self-pay | Admitting: Family Medicine

## 2016-07-17 MED ORDER — LEVOFLOXACIN 500 MG PO TABS: 500.0000 mg | ORAL_TABLET | Freq: Every day | ORAL | 0 refills | Status: DC

## 2016-07-17 NOTE — Telephone Encounter (Signed)
Patient called pharmacy this morning and was informed they have not received script for levofloxacin (LEVAQUIN) 500 MG tablet that was sent in by pcp yesterday.  Please contact pharmacy so patient can pick up medication today.

## 2016-07-17 NOTE — Telephone Encounter (Signed)
Lacey [name] with [facility] calling to state they have received both rx requests.  However, they had faxed over a notice to pcp to advise of severe drug interaction between citalopram (CELEXA) 20 MG tablet & levofloxacin (LEVAQUIN) 500 MG tablet, that could cause issues with heart rhythm.     Please call back to provide verbal approval before they can fill levofloxacin (LEVAQUIN) 500 MG tablet.  Pharmacy: [id number] [address], [place] [phone] (Phone) [phone] (Fax)

## 2016-07-17 NOTE — Telephone Encounter (Signed)
Called and gave the verbal ok for pt to have the 500mg . Medication will be filled for her today.

## 2016-07-17 NOTE — Telephone Encounter (Signed)
Yes- ok to take.  Pt just completed 5 days of 750mg  and this is a step down/extension of that abx course

## 2016-07-17 NOTE — Telephone Encounter (Signed)
Medication resent again today.

## 2016-07-24 ENCOUNTER — Ambulatory Visit (INDEPENDENT_AMBULATORY_CARE_PROVIDER_SITE_OTHER)
Admission: RE | Admit: 2016-07-24 | Discharge: 2016-07-24 | Disposition: A | Discharge status: Home / Self Care | Payer: Medicare Other | Source: Ambulatory Visit | Attending: Acute Care | Admitting: Acute Care

## 2016-07-24 DIAGNOSIS — Z87891 Personal history of nicotine dependence: Secondary | ICD-10-CM

## 2016-07-24 DIAGNOSIS — F17210 Nicotine dependence, cigarettes, uncomplicated: Secondary | ICD-10-CM

## 2016-07-29 ENCOUNTER — Other Ambulatory Visit: Payer: Self-pay | Admitting: Acute Care

## 2016-07-29 DIAGNOSIS — F17210 Nicotine dependence, cigarettes, uncomplicated: Principal | ICD-10-CM

## 2016-07-30 ENCOUNTER — Other Ambulatory Visit: Payer: Self-pay | Admitting: Family Medicine

## 2016-07-30 ENCOUNTER — Encounter: Payer: Self-pay | Admitting: Family Medicine

## 2016-07-30 MED ORDER — ONETOUCH VERIO VI STRP: ORAL_STRIP | 1 refills | Status: DC

## 2016-07-30 MED ORDER — ONETOUCH DELICA LANCETS 33G MISC: 1 refills | Status: DC

## 2016-07-30 MED ORDER — GABAPENTIN 300 MG PO CAPS: 300.0000 mg | ORAL_CAPSULE | Freq: Every day | ORAL | 1 refills | Status: DC

## 2016-07-30 MED ORDER — FENOFIBRATE 160 MG PO TABS: 160.0000 mg | ORAL_TABLET | Freq: Every day | ORAL | 1 refills | Status: DC

## 2016-08-03 ENCOUNTER — Other Ambulatory Visit: Payer: Self-pay | Admitting: Family Medicine

## 2016-08-11 ENCOUNTER — Other Ambulatory Visit: Payer: Self-pay | Admitting: Family Medicine

## 2016-09-06 ENCOUNTER — Encounter: Payer: Self-pay | Admitting: Family Medicine

## 2016-09-17 ENCOUNTER — Ambulatory Visit (INDEPENDENT_AMBULATORY_CARE_PROVIDER_SITE_OTHER): Payer: Medicare Other | Admitting: Family Medicine

## 2016-09-17 ENCOUNTER — Encounter: Payer: Self-pay | Admitting: Family Medicine

## 2016-09-17 ENCOUNTER — Other Ambulatory Visit: Payer: Self-pay | Admitting: Family Medicine

## 2016-09-17 VITALS — BP 121/83 | HR 78 | Temp 98.4°F | Resp 16 | Ht 62.0 in | Wt 230.5 lb

## 2016-09-17 DIAGNOSIS — E1165 Type 2 diabetes mellitus with hyperglycemia: Secondary | ICD-10-CM

## 2016-09-17 DIAGNOSIS — E1140 Type 2 diabetes mellitus with diabetic neuropathy, unspecified: Secondary | ICD-10-CM

## 2016-09-17 DIAGNOSIS — Z794 Long term (current) use of insulin: Secondary | ICD-10-CM

## 2016-09-17 DIAGNOSIS — E11319 Type 2 diabetes mellitus with unspecified diabetic retinopathy without macular edema: Secondary | ICD-10-CM

## 2016-09-17 DIAGNOSIS — IMO0002 Reserved for concepts with insufficient information to code with codable children: Secondary | ICD-10-CM

## 2016-09-17 LAB — BASIC METABOLIC PANEL
BUN: 16 mg/dL (ref 6–23)
CO2: 27 mEq/L (ref 19–32)
CREATININE: 0.91 mg/dL (ref 0.40–1.20)
Calcium: 9.8 mg/dL (ref 8.4–10.5)
Chloride: 107 mEq/L (ref 96–112)
GFR: 65.67 mL/min (ref >60.00)
Glucose, Bld: 134 mg/dL — ABNORMAL HIGH (ref 70–99)
POTASSIUM: 4.6 meq/L (ref 3.5–5.1)
Sodium: 141 mEq/L (ref 135–145)

## 2016-09-17 LAB — HEMOGLOBIN A1C: Hgb A1c MFr Bld: 7.4 % — ABNORMAL HIGH (ref 4.6–6.5)

## 2016-09-17 MED ORDER — GABAPENTIN 300 MG PO CAPS: 300.0000 mg | ORAL_CAPSULE | Freq: Two times a day (BID) | ORAL | 1 refills | Status: DC

## 2016-09-17 NOTE — Progress Notes (Signed)
Pre visit review using our clinic review tool, if applicable. No additional management support is needed unless otherwise documented below in the visit note. 

## 2016-09-17 NOTE — Progress Notes (Signed)
   Subjective:    Patient ID: Lacey [name], female    DOB: Mar 16, 1951, 66 y.o.   MRN: [id number]  HPI DM- chronic problem, on 80 units of Lantus, 35 units of Humalog TID, Jardiance.  Last A1C 8.  On ARB for renal protection.  UTD on eye exam, foot exam.  Pt reports 'I feel good'.  No CP w/ exception of musculoskeletal chest pain, SOB, HAs, visual changes, edema.  Rare symptomatic lows.  Pt reports neuropathy of feet are worsening.   Review of Systems For ROS see HPI     Objective:   Physical Exam  Constitutional: She is oriented to person, place, and time. She appears well-developed and well-nourished. No distress.  obese  HENT:  Head: Normocephalic and atraumatic.  Eyes: Conjunctivae and EOM are normal. Pupils are equal, round, and reactive to light.  Neck: Normal range of motion. Neck supple. No thyromegaly present.  Cardiovascular: Normal rate, regular rhythm, normal heart sounds and intact distal pulses.   No murmur heard. Pulmonary/Chest: Effort normal and breath sounds normal. No respiratory distress.  Abdominal: Soft. She exhibits no distension. There is no tenderness.  Musculoskeletal: She exhibits no edema.  Lymphadenopathy:    She has no cervical adenopathy.  Neurological: She is alert and oriented to person, place, and time.  Skin: Skin is warm and dry.  Psychiatric: She has a normal mood and affect. Her behavior is normal.  Vitals reviewed.         Assessment & Plan:

## 2016-09-17 NOTE — Assessment & Plan Note (Signed)
Chronic problem.  Last A1C was 8.  Pt is on Lantus, Humalog, and Jardiance.  UTD on eye exam, foot exam.  On ARB for renal protection.  Stressed need for healthy diet and regular exercise.  Check labs.  Check labs.  Adjust meds prn

## 2016-09-17 NOTE — Patient Instructions (Signed)
Follow up in 3-4 months to recheck sugar, blood pressure, cholesterol We'll notify you of your lab results and make any changes if needed Continue to work on healthy diet and regular exercise- you can do it!!! Call with any questions or concerns Have a great summer!!!

## 2016-09-20 ENCOUNTER — Other Ambulatory Visit: Payer: Self-pay | Admitting: Family Medicine

## 2016-10-01 ENCOUNTER — Ambulatory Visit: Payer: Medicare Other | Admitting: Family Medicine

## 2016-10-01 ENCOUNTER — Other Ambulatory Visit: Payer: Self-pay | Admitting: General Practice

## 2016-10-01 MED ORDER — CLONAZEPAM 1 MG PO TABS: 1.0000 mg | ORAL_TABLET | Freq: Every day | ORAL | 0 refills | Status: DC

## 2016-10-01 NOTE — Telephone Encounter (Signed)
Medication filled to pharmacy as requested.   

## 2016-10-01 NOTE — Telephone Encounter (Signed)
Last OV 09/17/16 Clonazepam last filled 06/04/16 #30 with 1  Pt requesting to mail order

## 2016-10-18 ENCOUNTER — Other Ambulatory Visit: Payer: Self-pay | Admitting: Family Medicine

## 2016-11-17 ENCOUNTER — Other Ambulatory Visit: Payer: Self-pay | Admitting: Internal Medicine

## 2016-11-17 MED ORDER — METOPROLOL SUCCINATE ER 50 MG PO TB24: 50.0000 mg | ORAL_TABLET | Freq: Two times a day (BID) | ORAL | 0 refills | Status: DC

## 2016-11-17 NOTE — Telephone Encounter (Signed)
Pt calling requesting a refill on metoprolol 50 mg tablet, BID. Pt was prescribed metoprolol 100 mg tablet, taken 50 mg BID. A Rx was sent in for metoprolol 50 mg tablet, dispensing 180 mg tablet with 0 refills, asking pt to keep upcoming appointment for future refills.

## 2016-11-18 ENCOUNTER — Encounter: Payer: Self-pay | Admitting: *Deleted

## 2016-11-24 ENCOUNTER — Telehealth: Payer: Self-pay | Admitting: Family Medicine

## 2016-11-24 NOTE — Telephone Encounter (Signed)
Called pt and received verbal ok to give information to [facility]. Called and gave information to [place].

## 2016-11-24 NOTE — Telephone Encounter (Signed)
Lacey [name] with [facility] calling to request most recent A1C & BP results with dates performed.  Please call her back at [phone] ext [phone] with information.  This is a confidential voice mail, ok to leave message if she does not answer.

## 2016-12-02 ENCOUNTER — Ambulatory Visit (INDEPENDENT_AMBULATORY_CARE_PROVIDER_SITE_OTHER): Payer: Medicare Other | Admitting: Internal Medicine

## 2016-12-02 ENCOUNTER — Telehealth: Payer: Self-pay | Admitting: Family Medicine

## 2016-12-02 ENCOUNTER — Encounter: Payer: Self-pay | Admitting: Internal Medicine

## 2016-12-02 VITALS — BP 146/70 | HR 72 | Ht 62.0 in | Wt 232.0 lb

## 2016-12-02 DIAGNOSIS — I5030 Unspecified diastolic (congestive) heart failure: Secondary | ICD-10-CM | POA: Diagnosis not present

## 2016-12-02 DIAGNOSIS — I471 Supraventricular tachycardia: Secondary | ICD-10-CM

## 2016-12-02 MED ORDER — OLMESARTAN MEDOXOMIL 40 MG PO TABS: 40.0000 mg | ORAL_TABLET | Freq: Every day | ORAL | 1 refills | Status: DC

## 2016-12-02 NOTE — Telephone Encounter (Signed)
Pt states that there has been a recall on her BP medication and asking for a new one to be called into [facility] and a 7 day supply be called into [facility] on [address].

## 2016-12-02 NOTE — Telephone Encounter (Signed)
Chart updated to reflect and refills sent as requested. Pt informed.

## 2016-12-02 NOTE — Telephone Encounter (Signed)
Please advise 

## 2016-12-02 NOTE — Progress Notes (Signed)
Patient Care Team: Midge Minium, MD as PCP - General Deboraha Sprang, MD as Consulting Physician (Cardiology) Almedia Balls, MD as Consulting Physician (Orthopedic Surgery) Philemon Kingdom, MD as Consulting Physician (Internal Medicine) Kathie Rhodes, MD as Consulting Physician (Urology) Dyke Maes, [place] as Consulting Physician (Optometry) Irene Shipper, MD as Consulting Physician (Gastroenterology)   HPI  Lacey [name] is a 66 y.o. female w history of palpitations. They were thought upon evaluation to be caused by atrial tachycardia  She also has hypertension  She has chronic SOB; no snoring.  She is mostly at home. She doesn't exercise.   She continues to smoke albeit infrequently.    She has no [place] CAD per cath 3/06 .An echocardiogram 04/30/10: EF 55-60%, mild LVH, grade 1 diastolic dysfunction. She had a Myoview done in 04/30/10: EF 63%, no scar, no ischemia.  Repeat scanning 6/16 was normal   Date Cr K HgbA1c  5/18  0.91 4.6 7.4          Past Medical History:  Diagnosis Date  . Adenomatous colon polyp    hyperplastic  . Anginal pain ([place])    not recent  . Arthritis    NECK, KNEES, FINGERS, TOES  . Atrial tachycardia ([place]) CARDIOLOGIST - DR Caryl Comes (LAST VISIT AUG 2012)   Echo 12/11: EF 55-60%, Mild LVH, grade 1 diast dysfxn;   holter 1/12: ATach  . Atypical chest pain    a. 07/2004 Cath: Clean cors;  b. 04/2010 Myoview: EF 63%, no ischemia  . Blood transfusion without reported diagnosis 1969  . Chronic kidney disease   . Diabetes mellitus, type 2 (HCC)    ORAL AND INSULIN MEDS (LAST A1C  7.3)  . Diverticulosis   . Erythema CURRENT-- CLOSED ABD. WALL ABSCESS   PER PCP NOTE (03-31-11)- MRSA-- TAKES DOXYCYCLINE  . GERD (gastroesophageal reflux disease)    CONTROLLED W/ PREVACID  . History of kidney stones 2012  . Hyperlipidemia   . Hypertension   . Insomnia    TAKES MEDS PRN  . Neuropathy, peripheral   . Obesity (BMI 30-39.9) 02/19/2015  .  Pneumonia    as child  . Post op infection 11/07/12   left bunionectomy  . Restless leg syndrome   . Sepsis, urinary HISTORY - 2004  . Sleep apnea   . Vitamin D deficiency     Past Surgical History:  Procedure Laterality Date  . ABDOMINAL HYSTERECTOMY  1995   ovaries remain  . BUNIONECTOMY Left 10/24/12  . CARDIAC CATHETERIZATION  07/10/04  . CARPAL TUNNEL RELEASE  2000   RIGHT  . CERVICAL FUSION  10-12-07   C5 - 7  . COLONOSCOPY    . CYSTOSCOPY WITH RETROGRADE PYELOGRAM, URETEROSCOPY AND STENT PLACEMENT Left 11/28/2012   Procedure: LEFT RETROGRADE PYELOGRAM, LEFT URETEROSCOPY, ;  Surgeon: Claybon Jabs, MD;  Location: [facility];  Service: Urology;  Laterality: Left;  . CYSTOSCOPY/RETROGRADE/URETEROSCOPY/STONE EXTRACTION WITH BASKET  X2 2004 & 2009   LEFT   . GASTRIC BYPASS  1981  . KNEE ARTHROSCOPY  AUG 2012   LEFT  . LAPAROSCOPIC CHOLECYSTECTOMY  2001  . left thumb joint surgery  2013  . PERCUTANEOUS NEPHROSTOLITHOTOMY  02-27-11   LEFT  . POLYPECTOMY    . RIGHT THUMB JOINT SURG.   MAY 2011  . TRIGGER FINGER RELEASE  2010   RIGHT THUMB  . UPPER GASTROINTESTINAL ENDOSCOPY    . URETERAL STENT PLACEMENT  X2  2004  &  2009   LEFT  . URETEROSCOPY  04/06/2011   Procedure: URETEROSCOPY;  Surgeon: Claybon Jabs, MD;  Location: [facility];  Service: Urology;  Laterality: Left;  L Ureteroscopy Laser Litho & Stent     Current Outpatient Prescriptions  Medication Sig Dispense Refill  . aspirin EC 81 MG tablet Take 81 mg by mouth daily.    Marland Kitchen atorvastatin (LIPITOR) 20 MG tablet TAKE 1 TABLET BY MOUTH  DAILY 90 tablet 1  . celecoxib (CELEBREX) 200 MG capsule Take 200 mg by mouth every morning.    . citalopram (CELEXA) 20 MG tablet Take 20 mg by mouth daily.    . clonazePAM (KLONOPIN) 1 MG tablet Take 1 tablet (1 mg total) by mouth at bedtime. 1 tab nightly for restless leg. 90 tablet 0  . clotrimazole-betamethasone (LOTRISONE) cream Apply 1 application topically 2 (two)  times daily. 45 g 0  . diphenoxylate-atropine (LOMOTIL) 2.5-0.025 MG per tablet Take 1 tablet by mouth 4 (four) times daily as needed for diarrhea or loose stools. 30 tablet 0  . fenofibrate 160 MG tablet Take 1 tablet (160 mg total) by mouth daily. 90 tablet 1  . furosemide (LASIX) 20 MG tablet Take 1 tablet (20 mg total) by mouth daily. 90 tablet 3  . gabapentin (NEURONTIN) 300 MG capsule Take 1 capsule (300 mg total) by mouth 2 (two) times daily. 180 capsule 1  . HUMALOG KWIKPEN 100 UNIT/ML KiwkPen INJECT 35 UNITS  SUBCUTANEOUSLY 3 TIMES  DAILY AS DIRECTED 105 mL 1  . HYDROcodone-acetaminophen (NORCO/VICODIN) 5-325 MG tablet Take 1 tablet by mouth every 4 (four) hours as needed. 16 tablet 0  . hydrocortisone valerate cream (WESTCORT) 0.2 % Apply 1 application topically as needed (for skin irritation).    . Insulin Pen Needle (B-D ULTRAFINE III SHORT PEN) 31G X 8 MM MISC USE FOUR TIMES A DAY 360 each 1  . JARDIANCE 10 MG TABS tablet TAKE 1 TABLET BY MOUTH  DAILY 90 tablet 1  . lansoprazole (PREVACID) 15 MG capsule Take 15 mg by mouth daily.    Marland Kitchen LANTUS SOLOSTAR 100 UNIT/ML Solostar Pen INJECT SUBCUTANEOUSLY 80  UNITS DAILY AT 10PM 60 mL 6  . levothyroxine (SYNTHROID, LEVOTHROID) 50 MCG tablet TAKE 1 TABLET BY MOUTH  DAILY BEFORE BREAKFAST 90 tablet 1  . loperamide (IMODIUM) 2 MG capsule Take 2 mg by mouth 4 (four) times daily as needed for diarrhea or loose stools.    . metoprolol succinate (TOPROL-XL) 50 MG 24 hr tablet Take 1 tablet (50 mg total) by mouth 2 (two) times daily. Take with or immediately following a meal. 180 tablet 0  . nitrofurantoin, macrocrystal-monohydrate, (MACROBID) 100 MG capsule Take 100 mg by mouth as needed (for urinary tract infection). Take 1 capsule by mouth twice daily for 7 days    . olmesartan (BENICAR) 40 MG tablet Take 1 tablet (40 mg total) by mouth daily. 90 tablet 1  . ondansetron (ZOFRAN) 4 MG tablet Take 1 tablet (4 mg total) by mouth every 8 (eight) hours as  needed for nausea or vomiting. 12 tablet 0  . ONETOUCH DELICA LANCETS 50D MISC USE FOUR TIMES A DAY 360 each 1  . ONETOUCH VERIO test strip Use on e strip each time sugars are tested, pt tests 4 times daily. Dx. E11.40 360 each 1  . Oxycodone HCl 10 MG TABS TAKE 1-2 TABLETS BY MOUTH EVERY 4 HOURS PRN FOR PAIN  0  . oxyCODONE-acetaminophen (PERCOCET/ROXICET) 5-325 MG per  tablet 1 tablet every 6 (six) hours as needed (for pain). Reported on 09/20/2015  0  . traZODone (DESYREL) 50 MG tablet TAKE 1 TABLET BY MOUTH AT  BEDTIME AS NEEDED FOR SLEEP 90 tablet 1   No current facility-administered medications for this visit.     No Known Allergies  Review of Systems negative except from HPI and PMH  Physical Exam BP (!) 146/70   Pulse 72   Ht 5\' 2"  (1.575 m)   Wt 232 lb (105.2 kg)   LMP 06/06/1993   SpO2 98%   BMI 42.43 kg/m  Well developed and nourished in no acute distress HENT normal Neck supple with JVP-flat Carotids brisk and full without bruits Clear Regular rate and rhythm, no murmurs or gallops Abd-soft with active BS without hepatomegaly No Clubbing cyanosis edema Skin-warm and dry A & Oriented  Grossly normal sensory and motor function   ECG demonstrates sinus rhythm at 70 Intervals 15/09/40 Nonspecific T waves   Assessment and  Plan  Atrial tachycardia  Symptomatically quiet  HFpEF  Hypertension  Morbidly obese   Euvolemic continue current meds  I've encouraged her to exercise. Her obesity is a problem. Weights have remained stable. We also discussed dieting using the [employer] in conjunction with exercise.

## 2016-12-02 NOTE — Patient Instructions (Signed)
Medication Instructions: - Your physician recommends that you continue on your current medications as directed. Please refer to the Current Medication list given to you today.  Labwork: - none ordered  Procedures/Testing: - none ordered  Follow-Up: - Your physician wants you to follow-up in: 1 year with Tommye Standard, PA for Dr. Caryl Comes.  You will receive a reminder letter in the mail two months in advance. If you don't receive a letter, please call our office to schedule the follow-up appointment.   Any Additional Special Instructions Will Be Listed Below (If Applicable).     If you need a refill on your cardiac medications before your next appointment, please call your pharmacy.

## 2016-12-02 NOTE — Telephone Encounter (Signed)
Ok to stop Valsartan and start Benicar 40mg  daily

## 2016-12-18 LAB — HM MAMMOGRAPHY

## 2016-12-20 ENCOUNTER — Other Ambulatory Visit: Payer: Self-pay | Admitting: Family Medicine

## 2016-12-21 ENCOUNTER — Encounter: Payer: Self-pay | Admitting: General Practice

## 2016-12-29 ENCOUNTER — Encounter: Payer: Self-pay | Admitting: Family Medicine

## 2016-12-29 ENCOUNTER — Ambulatory Visit (INDEPENDENT_AMBULATORY_CARE_PROVIDER_SITE_OTHER): Payer: Medicare Other | Admitting: Family Medicine

## 2016-12-29 VITALS — BP 126/82 | HR 74 | Temp 98.7°F | Resp 16 | Ht 62.0 in | Wt 229.4 lb

## 2016-12-29 DIAGNOSIS — E11319 Type 2 diabetes mellitus with unspecified diabetic retinopathy without macular edema: Secondary | ICD-10-CM

## 2016-12-29 DIAGNOSIS — Z794 Long term (current) use of insulin: Secondary | ICD-10-CM

## 2016-12-29 DIAGNOSIS — I110 Hypertensive heart disease with heart failure: Secondary | ICD-10-CM

## 2016-12-29 DIAGNOSIS — E785 Hyperlipidemia, unspecified: Secondary | ICD-10-CM | POA: Diagnosis not present

## 2016-12-29 DIAGNOSIS — E1169 Type 2 diabetes mellitus with other specified complication: Secondary | ICD-10-CM | POA: Diagnosis not present

## 2016-12-29 DIAGNOSIS — Z23 Encounter for immunization: Secondary | ICD-10-CM

## 2016-12-29 DIAGNOSIS — E1140 Type 2 diabetes mellitus with diabetic neuropathy, unspecified: Secondary | ICD-10-CM | POA: Diagnosis not present

## 2016-12-29 DIAGNOSIS — E1165 Type 2 diabetes mellitus with hyperglycemia: Secondary | ICD-10-CM | POA: Diagnosis not present

## 2016-12-29 DIAGNOSIS — IMO0002 Reserved for concepts with insufficient information to code with codable children: Secondary | ICD-10-CM

## 2016-12-29 LAB — LIPID PANEL
CHOL/HDL RATIO: 4
Cholesterol: 133 mg/dL (ref 0–200)
HDL: 34.2 mg/dL — AB (ref >39.00)
LDL Cholesterol: 71 mg/dL (ref 0–99)
NONHDL: 98.56
Triglycerides: 140 mg/dL (ref 0.0–149.0)
VLDL: 28 mg/dL (ref 0.0–40.0)

## 2016-12-29 LAB — CBC WITH DIFFERENTIAL/PLATELET
Basophils Absolute: 0.1 10*3/uL (ref 0.0–0.1)
Basophils Relative: 0.9 % (ref 0.0–3.0)
EOS PCT: 3 % (ref 0.0–5.0)
Eosinophils Absolute: 0.2 10*3/uL (ref 0.0–0.7)
HEMATOCRIT: 43.5 % (ref 36.0–46.0)
HEMOGLOBIN: 14.2 g/dL (ref 12.0–15.0)
LYMPHS PCT: 28.6 % (ref 12.0–46.0)
Lymphs Abs: 2 10*3/uL (ref 0.7–4.0)
MCHC: 32.7 g/dL (ref 30.0–36.0)
MCV: 93.7 fl (ref 78.0–100.0)
MONOS PCT: 6.2 % (ref 3.0–12.0)
Monocytes Absolute: 0.4 10*3/uL (ref 0.1–1.0)
Neutro Abs: 4.4 10*3/uL (ref 1.4–7.7)
Neutrophils Relative %: 61.3 % (ref 43.0–77.0)
Platelets: 178 10*3/uL (ref 150.0–400.0)
RBC: 4.64 Mil/uL (ref 3.87–5.11)
RDW: 14.4 % (ref 11.5–15.5)
WBC: 7.1 10*3/uL (ref 4.0–10.5)

## 2016-12-29 LAB — HEPATIC FUNCTION PANEL
ALT: 15 U/L (ref 0–35)
AST: 18 U/L (ref 0–37)
Albumin: 4.3 g/dL (ref 3.5–5.2)
Alkaline Phosphatase: 59 U/L (ref 39–117)
BILIRUBIN DIRECT: 0.1 mg/dL (ref 0.0–0.3)
BILIRUBIN TOTAL: 0.4 mg/dL (ref 0.2–1.2)
Total Protein: 6.8 g/dL (ref 6.0–8.3)

## 2016-12-29 LAB — HEMOGLOBIN A1C: Hgb A1c MFr Bld: 7.6 % — ABNORMAL HIGH (ref 4.6–6.5)

## 2016-12-29 LAB — TSH: TSH: 2.05 u[IU]/mL (ref 0.35–4.50)

## 2016-12-29 LAB — BASIC METABOLIC PANEL
BUN: 16 mg/dL (ref 6–23)
CALCIUM: 9.5 mg/dL (ref 8.4–10.5)
CHLORIDE: 104 meq/L (ref 96–112)
CO2: 29 mEq/L (ref 19–32)
CREATININE: 0.84 mg/dL (ref 0.40–1.20)
GFR: 71.96 mL/min (ref >60.00)
Glucose, Bld: 150 mg/dL — ABNORMAL HIGH (ref 70–99)
Potassium: 4.8 mEq/L (ref 3.5–5.1)
Sodium: 139 mEq/L (ref 135–145)

## 2016-12-29 NOTE — Progress Notes (Signed)
Pre visit review using our clinic review tool, if applicable. No additional management support is needed unless otherwise documented below in the visit note. 

## 2016-12-29 NOTE — Assessment & Plan Note (Signed)
See above

## 2016-12-29 NOTE — Progress Notes (Signed)
   Subjective:    Patient ID: Lacey [name], female    DOB: 1950-12-09, 66 y.o.   MRN: [id number]  HPI DM- chronic problem, on Jardiance and Lantus.  On ARB for renal protection.  UTD on eye exam, foot exam.  Denies symptomatic lows, numbness/tingling of hands/feet that is new or different from previous.  HTN- chronic problem, on Metoprolol, Lasix, and Benicar w/ good control today.  Denies CP, SOB, HAs, visual changes, edema.  Hyperlipidemia- chronic problem, on Lipitor and Fenofibrate daily.  Pt is down 3 lbs from last visit.  Denies abd pain, N/V, myalgias.   Review of Systems For ROS see HPI     Objective:   Physical Exam  Constitutional: She is oriented to person, place, and time. She appears well-developed and well-nourished. No distress.  HENT:  Head: Normocephalic and atraumatic.  Eyes: Pupils are equal, round, and reactive to light. Conjunctivae and EOM are normal.  Neck: Normal range of motion. Neck supple. No thyromegaly present.  Cardiovascular: Normal rate, regular rhythm, normal heart sounds and intact distal pulses.   No murmur heard. Pulmonary/Chest: Effort normal and breath sounds normal. No respiratory distress.  Abdominal: Soft. She exhibits no distension. There is no tenderness.  Musculoskeletal: She exhibits no edema.  Lymphadenopathy:    She has no cervical adenopathy.  Neurological: She is alert and oriented to person, place, and time.  Skin: Skin is warm and dry.  Psychiatric: She has a normal mood and affect. Her behavior is normal.  Vitals reviewed.         Assessment & Plan:

## 2016-12-29 NOTE — Assessment & Plan Note (Signed)
Chronic problem.  On Lantus and Jardiance w/o difficulty.  UTD on foot exam, eye exam.  On ARB for renal protection.  Again stressed need for healthy diet and regular exercise.  Check labs.  Adjust meds prn

## 2016-12-29 NOTE — Patient Instructions (Signed)
Follow up in 3-4 months to recheck diabetes We'll notify you of your lab results and make any changes if needed Continue to work on healthy diet and regular exercise- you're losing weight!!! Call with any questions or concerns Happy Labor Day!!!

## 2016-12-29 NOTE — Assessment & Plan Note (Signed)
Chronic problem.  Tolerating statin and fenofibrate w/o difficulty.  Stressed need for healthy diet and regular exercise.  Check labs.  Adjust meds prn

## 2016-12-29 NOTE — Assessment & Plan Note (Signed)
Chronic problem.  BP adequately controlled today.  Asymptomatic.  Check labs.  No anticipated med changes.

## 2016-12-30 ENCOUNTER — Encounter: Payer: Self-pay | Admitting: General Practice

## 2017-01-05 ENCOUNTER — Other Ambulatory Visit: Payer: Self-pay | Admitting: Internal Medicine

## 2017-01-16 ENCOUNTER — Other Ambulatory Visit: Payer: Self-pay | Admitting: Family Medicine

## 2017-01-18 ENCOUNTER — Other Ambulatory Visit: Payer: Self-pay | Admitting: General Practice

## 2017-01-18 MED ORDER — CLONAZEPAM 1 MG PO TABS: 1.0000 mg | ORAL_TABLET | Freq: Every day | ORAL | 0 refills | Status: DC

## 2017-01-18 NOTE — Telephone Encounter (Signed)
Medication filled to pharmacy as requested.   

## 2017-01-18 NOTE — Telephone Encounter (Signed)
Last OV 12/29/16 Clonazepam last filled 10/01/16 #90 with 0

## 2017-01-26 NOTE — Progress Notes (Signed)
[facility] Note  01/27/2017     CHIEF COMPLAINT Patient presents for Diabetic Eye Exam   HISTORY OF PRESENT ILLNESS: Lacey [name] is a 66 y.o. female who presents to the clinic today for:   HPI    Diabetic Eye Exam  Vision is stable.  Associated Symptoms Negative for Flashes, Blind Spot, Photophobia, Scalp Tenderness, Fever, Floaters, Pain, Glare, Jaw Claudication, Weight Loss, Distortion, Redness, Trauma, Shoulder/Hip pain and Fatigue.  Diabetes characteristics include Type 2, on insulin and taking oral medications.  This started 19 years ago.  Blood sugar level is controlled.  Last Blood Glucose 121.  Last A1C 7.  I, the attending physician,  performed the HPI with the patient and updated documentation appropriately.        Comments  Pt presents on the referral of C. Thom Chimes, OD for DM exam with concern of cascading exudates approaching macula OD; Pt states she is type II IDDM; Pt reports that CGB is "pretty much" stable; Pt reports that last CBG was 121 (this AM), and last A1C was 7.4; Pt reports that Dr. Thom Chimes "saw something that concerned him"; Pt denies floaters, denies flashes, denies wavy VA; Pt denies gtts usage, denies taking eye vits;      Last edited by Bernarda Caffey, MD on 01/27/2017  1:43 PM. (History)     Lab Results  Component Value Date   HGBA1C 7.6 (H) 12/29/2016     Referring physician: Dyke Maes, [address] [place], [place] [address]  HISTORICAL INFORMATION:   Selected notes from the MEDICAL RECORD NUMBER Referral from Dr. Micheline Rough, OD, for diabetic eye exam, concern for cascading exudates approaching macula OD;  Ocular Hx - IDDM type II;  PHM - IDDM type II; HTN; Hypercholesterolemia;    CURRENT MEDICATIONS: No current outpatient prescriptions on file. (Ophthalmic Drugs)   No current facility-administered medications for this visit.  (Ophthalmic Drugs)   Current Outpatient Prescriptions (Other)  Medication  Sig   losartan (COZAAR) 100 MG tablet Take 100 mg by mouth daily.   aspirin EC 81 MG tablet Take 81 mg by mouth daily.   atorvastatin (LIPITOR) 20 MG tablet TAKE 1 TABLET BY MOUTH  DAILY   celecoxib (CELEBREX) 200 MG capsule Take 200 mg by mouth every morning.   citalopram (CELEXA) 20 MG tablet Take 20 mg by mouth daily.   clonazePAM (KLONOPIN) 1 MG tablet Take 1 tablet (1 mg total) by mouth at bedtime. 1 tab nightly for restless leg.   clotrimazole-betamethasone (LOTRISONE) cream Apply 1 application topically 2 (two) times daily.   diphenoxylate-atropine (LOMOTIL) 2.5-0.025 MG per tablet Take 1 tablet by mouth 4 (four) times daily as needed for diarrhea or loose stools.   fenofibrate 160 MG tablet TAKE 1 TABLET BY MOUTH  DAILY   furosemide (LASIX) 20 MG tablet Take 1 tablet (20 mg total) by mouth daily.   gabapentin (NEURONTIN) 300 MG capsule Take 1 capsule (300 mg total) by mouth 2 (two) times daily.   HUMALOG KWIKPEN 100 UNIT/ML KiwkPen INJECT 35 UNITS  SUBCUTANEOUSLY 3 TIMES  DAILY AS DIRECTED   HYDROcodone-acetaminophen (NORCO/VICODIN) 5-325 MG tablet Take 1 tablet by mouth every 4 (four) hours as needed.   hydrocortisone valerate cream (WESTCORT) 0.2 % Apply 1 application topically as needed (for skin irritation).   Insulin Pen Needle (B-D ULTRAFINE III SHORT PEN) 31G X 8 MM MISC USE FOUR TIMES A DAY   JARDIANCE 10 MG TABS tablet TAKE 1 TABLET  BY MOUTH  DAILY   lansoprazole (PREVACID) 15 MG capsule Take 15 mg by mouth daily.   LANTUS SOLOSTAR 100 UNIT/ML Solostar Pen INJECT SUBCUTANEOUSLY 80  UNITS DAILY AT 10PM   levothyroxine (SYNTHROID, LEVOTHROID) 50 MCG tablet TAKE 1 TABLET BY MOUTH  DAILY BEFORE BREAKFAST   loperamide (IMODIUM) 2 MG capsule Take 2 mg by mouth 4 (four) times daily as needed for diarrhea or loose stools.   metoprolol succinate (TOPROL-XL) 50 MG 24 hr tablet TAKE 1 TABLET BY MOUTH 2  TIMES DAILY. TAKE WITH OR  IMMEDIATELY FOLLOWING A  MEAL.    nitrofurantoin, macrocrystal-monohydrate, (MACROBID) 100 MG capsule Take 100 mg by mouth as needed (for urinary tract infection). Take 1 capsule by mouth twice daily for 7 days   olmesartan (BENICAR) 40 MG tablet Take 1 tablet (40 mg total) by mouth daily.   ondansetron (ZOFRAN) 4 MG tablet Take 1 tablet (4 mg total) by mouth every 8 (eight) hours as needed for nausea or vomiting.   ONETOUCH DELICA LANCETS 88F MISC USE FOUR TIMES A DAY   ONETOUCH VERIO test strip Use on e strip each time sugars are tested, pt tests 4 times daily. Dx. E11.40   Oxycodone HCl 10 MG TABS TAKE 1-2 TABLETS BY MOUTH EVERY 4 HOURS PRN FOR PAIN   oxyCODONE-acetaminophen (PERCOCET/ROXICET) 5-325 MG per tablet 1 tablet every 6 (six) hours as needed (for pain). Reported on 09/20/2015   traZODone (DESYREL) 50 MG tablet TAKE 1 TABLET BY MOUTH AT  BEDTIME AS NEEDED FOR SLEEP   No current facility-administered medications for this visit.  (Other)      REVIEW OF SYSTEMS: ROS    Positive for: Musculoskeletal, Endocrine, Cardiovascular   Negative for: Constitutional, Gastrointestinal, Neurological, Skin, Genitourinary, HENT, Eyes, Respiratory, Psychiatric, Allergic/Imm, Heme/Lymph   Last edited by Alyse Low on 01/27/2017  1:01 PM. (History)       ALLERGIES No Known Allergies  PAST MEDICAL HISTORY Past Medical History:  Diagnosis Date   Adenomatous colon polyp    hyperplastic   Anginal pain ([place])    not recent   Arthritis    NECK, KNEES, FINGERS, TOES   Atrial tachycardia ([place]) CARDIOLOGIST - DR Caryl Comes (LAST VISIT AUG 2012)   Echo 12/11: EF 55-60%, Mild LVH, grade 1 diast dysfxn;   holter 1/12: ATach   Atypical chest pain    a. 07/2004 Cath: Clean cors;  b. 04/2010 Myoview: EF 63%, no ischemia   Blood transfusion without reported diagnosis 1969   Chronic kidney disease    Diabetes mellitus, type 2 (HCC)    ORAL AND INSULIN MEDS (LAST A1C  7.3)   Diverticulosis    Erythema CURRENT--  CLOSED ABD. WALL ABSCESS   PER PCP NOTE (03-31-11)- MRSA-- TAKES DOXYCYCLINE   GERD (gastroesophageal reflux disease)    CONTROLLED W/ PREVACID   History of kidney stones 2012   Hyperlipidemia    Hypertension    Insomnia    TAKES MEDS PRN   Neuropathy, peripheral    Obesity (BMI 30-39.9) 02/19/2015   Pneumonia    as child   Post op infection 11/07/12   left bunionectomy   Restless leg syndrome    Sepsis, urinary HISTORY - 2004   Sleep apnea    Vitamin D deficiency    Past Surgical History:  Procedure Laterality Date   ABDOMINAL HYSTERECTOMY  1995   ovaries remain   BUNIONECTOMY Left 10/24/12   CARDIAC CATHETERIZATION  07/10/04   CARPAL TUNNEL RELEASE  2000  RIGHT   CERVICAL FUSION  10-12-07   C5 - 7   COLONOSCOPY     CYSTOSCOPY WITH RETROGRADE PYELOGRAM, URETEROSCOPY AND STENT PLACEMENT Left 11/28/2012   Procedure: LEFT RETROGRADE PYELOGRAM, LEFT URETEROSCOPY, ;  Surgeon: Claybon Jabs, MD;  Location: [facility];  Service: Urology;  Laterality: Left;   CYSTOSCOPY/RETROGRADE/URETEROSCOPY/STONE EXTRACTION WITH BASKET  X2 2004 & 2009   LEFT    GASTRIC BYPASS  1981   KNEE ARTHROSCOPY  AUG 2012   LEFT   LAPAROSCOPIC CHOLECYSTECTOMY  2001   left thumb joint surgery  2013   PERCUTANEOUS NEPHROSTOLITHOTOMY  02-27-11   LEFT   POLYPECTOMY     RIGHT THUMB JOINT SURG.   MAY 2011   TRIGGER FINGER RELEASE  2010   RIGHT THUMB   UPPER GASTROINTESTINAL ENDOSCOPY     URETERAL STENT PLACEMENT  X2  2004  &  2009   LEFT   URETEROSCOPY  04/06/2011   Procedure: URETEROSCOPY;  Surgeon: Claybon Jabs, MD;  Location: [facility];  Service: Urology;  Laterality: Left;  L Ureteroscopy Laser Litho & Stent     FAMILY HISTORY Family History  Problem Relation Age of Onset   Heart attack Father    Hyperlipidemia Mother    Hypertension Mother    Heart disease Sister    Hypertension Sister    Colon polyps Sister    Hypertension Sister     Colon polyps Sister    Hypertension Sister    Hypertension Sister    Lung cancer Sister 24       stage 4    Diabetes Unknown    Breast cancer Unknown    Heart disease Unknown    Colon cancer Other 47       nephew   Esophageal cancer Neg Hx    Rectal cancer Neg Hx    Stomach cancer Neg Hx    Amblyopia Neg Hx    Blindness Neg Hx    Cataracts Neg Hx    Glaucoma Neg Hx    Retinal detachment Neg Hx    Strabismus Neg Hx    Retinitis pigmentosa Neg Hx     SOCIAL HISTORY Social History  Substance Use Topics   Smoking status: Current Every Day Smoker    Packs/day: 0.25    Years: 43.00    Types: Cigarettes   Smokeless tobacco: Never Used     Comment: 0.75ppd x67yrs, 0.25ppd x72yr (07/12/15). Counseled to quit -smoking, nicotine replacement and smoking cessation classes discussed-3-4 a day as of 09-06-15   Alcohol use No         OPHTHALMIC EXAM:  Base Eye Exam    Visual Acuity (Snellen - Linear)      Right Left   Dist cc 20/25 -1 20/40   Dist ph cc NI NI   Correction:  Glasses       Tonometry (Applanation, 1:17 PM)      Right Left   Pressure 15 14       Pupils      Dark Light Shape React APD   Right 3 2 Round Brisk None   Left 3 2 Round Brisk None       Visual Fields (Counting fingers)      Left Right    Full Full       Extraocular Movement      Right Left    Full, Ortho Full, Ortho       Neuro/Psych    Oriented x3:  Yes   Mood/Affect:  Normal       Dilation    Both eyes:  1.0% Mydriacyl, 2.5% Phenylephrine @ 1:17 PM        Slit Lamp and Fundus Exam    External Exam      Right Left   External Normal Normal       Slit Lamp Exam      Right Left   Lids/Lashes Dermatochalasis - upper lid - superiorly  Dermatochalasis - upper lid - superiorly    Conjunctiva/Sclera White and quiet White and quiet   Cornea Clear Clear   Anterior Chamber Deep and quiet Deep and quiet   Iris Round and dilated Round and dilated   Lens 2+ Cortical  cataract, 2-3+ Nuclear sclerosis 2+ Cortical cataract, 2-3+ Nuclear sclerosis   Vitreous Vitreous syneresis Vitreous syneresis       Fundus Exam      Right Left   Disc Normal; no NVD Normal; no NVD   C/D Ratio 0.3 0.3   Macula Flat; Scattered drusen, RPE disruptions; no heme Flat; good foveal reflex; few drusen, RPE disruptions nasally; no heme   Vessels AV crossing changes; no NVE; spontaneous venous pulsations at the disc  AV crossing changes; no NVE; spontaneous venous pulsations at the disc    Periphery Attached; no heme Attached; no heme; scattered midzonal drusen        Refraction    Wearing Rx      Sphere Cylinder Axis   Right +1.50 +0.25 002   Left +1.00 +1.00 002   Age:  38m   Type:  PAL       Manifest Refraction      Sphere Cylinder Axis Dist VA   Right +0.50 +1.00 178 20/25-2   Left +1.50 +1.50 005 20/30          IMAGING AND PROCEDURES  Imaging and Procedures for 01/27/17  OCT, Retina - OU - Both Eyes     Right Eye Quality was good. Central Foveal Thickness: 267. Progression has been stable. Findings include normal foveal contour, no IRF, no SRF, pigment epithelial detachment (PVD superiorly and hyperreflective material in vitreous centrally; rare drusen).   Left Eye Quality was good. Central Foveal Thickness: 271. Progression has been stable. Findings include normal foveal contour, no IRF, no SRF (Rare drusen).      Fluorescein Angiography Optos (Transit OS)     Right Eye Progression has no prior data. Early phase findings include normal observations (Low signal). Mid/Late phase findings include normal observations.   Left Eye Progression has no prior data. Early phase findings include normal observations (Low signal). Mid/Late phase findings include normal observations.               ASSESSMENT/PLAN:    ICD-10-CM   1. Diabetes mellitus type 2 without retinopathy ([place]) E11.9 OCT, Retina - OU - Both Eyes    Fluorescein Angiography Optos  (Transit OS)  2. Early dry stage nonexudative age-related macular degeneration of both eyes H35.3131 Fluorescein Angiography Optos (Transit OS)  3. Mixed type age-related cataract, both eyes H25.813     1. Diabetes mellitus type 2 w/o retinopathy, OU  - The incidence, risk factors for progression, natural history and treatment options for diabetic retinopathy were discussed with patient.    - The need for close monitoring of blood glucose, blood pressure, and serum lipids, avoiding cigarette or any type of tobacco, and the need for long term follow up was also discussed with patient.  2. Age related macular degeneration, non-exudative, both eyes  - The incidence, anatomy, and pathology of dry AMD, risk of progression, and the AREDS and AREDS 2 study including smoking risks discussed with patient.  - scattered non central drusen OU  - no IRF/SRF on OCT  - no heme or evidence of CNVM  - Recommend amsler grid monitoring  3. [place] + CC, OU  - The symptoms of cataract, surgical options, and treatments and risks were discussed with patient.  - discussed diagnosis and progression  - approaching visual significance  - monitor for now   Ophthalmic Meds Ordered this visit:  No orders of the defined types were placed in this encounter.      Return in about 1 year (around 01/27/2018) for Dilated Exam, OCT.  There are no Patient Instructions on file for this visit.   Explained the diagnoses, plan, and follow up with the patient and they expressed understanding.  Patient expressed understanding of the importance of proper follow up care.   Gardiner Sleeper, M.D., Ph.D. Vitreoretinal Surgeon [facility] 01/27/17     Abbreviations: M myopia (nearsighted); A astigmatism; H hyperopia (farsighted); P presbyopia; Mrx spectacle prescription;  CTL contact lenses; OD right eye; OS left eye; OU both eyes  XT exotropia; ET esotropia; PEK punctate epithelial keratitis; PEE  punctate epithelial erosions; DES dry eye syndrome; MGD meibomian gland dysfunction; ATs artificial tears; PFAT's preservative free artificial tears; [place] nuclear sclerotic cataract; PSC posterior subcapsular cataract; ERM epi-retinal membrane; PVD posterior vitreous detachment; RD retinal detachment; DM diabetes mellitus; DR diabetic retinopathy; NPDR non-proliferative diabetic retinopathy; PDR proliferative diabetic retinopathy; CSME clinically significant macular edema; DME diabetic macular edema; dbh dot blot hemorrhages; CWS cotton wool spot; POAG primary open angle glaucoma; C/D cup-to-disc ratio; HVF humphrey visual field; GVF goldmann visual field; OCT optical coherence tomography; IOP intraocular pressure; BRVO Branch retinal vein occlusion; CRVO central retinal vein occlusion; CRAO central retinal artery occlusion; BRAO branch retinal artery occlusion; RT retinal tear; SB scleral buckle; PPV pars plana vitrectomy; VH Vitreous hemorrhage; PRP panretinal laser photocoagulation; IVK intravitreal kenalog; VMT vitreomacular traction; MH Macular hole;  NVD neovascularization of the disc; NVE neovascularization elsewhere; AREDS age related eye disease study; ARMD age related macular degeneration; POAG primary open angle glaucoma; EBMD epithelial/anterior basement membrane dystrophy; ACIOL anterior chamber intraocular lens; IOL intraocular lens; PCIOL posterior chamber intraocular lens; Phaco/IOL phacoemulsification with intraocular lens placement; [place] photorefractive keratectomy; LASIK laser assisted in situ keratomileusis; HTN hypertension; DM diabetes mellitus; COPD chronic obstructive pulmonary disease

## 2017-01-27 ENCOUNTER — Ambulatory Visit (INDEPENDENT_AMBULATORY_CARE_PROVIDER_SITE_OTHER): Payer: Medicare Other | Admitting: Ophthalmology

## 2017-01-27 ENCOUNTER — Encounter (INDEPENDENT_AMBULATORY_CARE_PROVIDER_SITE_OTHER): Payer: Self-pay | Admitting: Ophthalmology

## 2017-01-27 DIAGNOSIS — H353131 Nonexudative age-related macular degeneration, bilateral, early dry stage: Secondary | ICD-10-CM

## 2017-01-27 DIAGNOSIS — H25813 Combined forms of age-related cataract, bilateral: Secondary | ICD-10-CM | POA: Diagnosis not present

## 2017-01-27 DIAGNOSIS — E119 Type 2 diabetes mellitus without complications: Secondary | ICD-10-CM

## 2017-02-03 LAB — HM DIABETES EYE EXAM

## 2017-02-07 ENCOUNTER — Other Ambulatory Visit: Payer: Self-pay | Admitting: Family Medicine

## 2017-02-18 ENCOUNTER — Other Ambulatory Visit: Payer: Self-pay | Admitting: Family Medicine

## 2017-03-15 ENCOUNTER — Other Ambulatory Visit: Payer: Self-pay | Admitting: Family Medicine

## 2017-03-16 ENCOUNTER — Ambulatory Visit: Payer: Medicare Other | Admitting: Family Medicine

## 2017-03-16 ENCOUNTER — Other Ambulatory Visit: Payer: Self-pay

## 2017-03-16 ENCOUNTER — Encounter: Payer: Self-pay | Admitting: Family Medicine

## 2017-03-16 VITALS — BP 133/78 | HR 65 | Resp 16 | Ht 62.0 in | Wt 234.1 lb

## 2017-03-16 DIAGNOSIS — E1165 Type 2 diabetes mellitus with hyperglycemia: Secondary | ICD-10-CM | POA: Diagnosis not present

## 2017-03-16 DIAGNOSIS — IMO0002 Reserved for concepts with insufficient information to code with codable children: Secondary | ICD-10-CM

## 2017-03-16 DIAGNOSIS — E11319 Type 2 diabetes mellitus with unspecified diabetic retinopathy without macular edema: Secondary | ICD-10-CM

## 2017-03-16 LAB — BASIC METABOLIC PANEL
BUN: 14 mg/dL (ref 6–23)
CALCIUM: 9.2 mg/dL (ref 8.4–10.5)
CO2: 28 mEq/L (ref 19–32)
Chloride: 106 mEq/L (ref 96–112)
Creatinine, Ser: 0.99 mg/dL (ref 0.40–1.20)
GFR: 59.49 mL/min — AB (ref >60.00)
GLUCOSE: 104 mg/dL — AB (ref 70–99)
POTASSIUM: 5.2 meq/L — AB (ref 3.5–5.1)
SODIUM: 140 meq/L (ref 135–145)

## 2017-03-16 LAB — HEMOGLOBIN A1C: HEMOGLOBIN A1C: 7.4 % — AB (ref 4.6–6.5)

## 2017-03-16 NOTE — Progress Notes (Signed)
   Subjective:    Patient ID: Lacey [name], female    DOB: 09/13/1950, 66 y.o.   MRN: [id number]  HPI DM- chronic problem, on Jardiance 10mg , Lantus, Humalog.  UTD on foot exam, eye exam.  On ARB for renal protection.  Home CBGs '73, 85- they've been unreal'.  Pt has gained 5 lbs from last OV.  No CP, SOB, HAs.  + vision changes since switching glasses.  No abd pain, N/V.  No changes to neuropathy.  Rare symptomatic lows.     Review of Systems For ROS see HPI     Objective:   Physical Exam  Constitutional: She is oriented to person, place, and time. She appears well-developed and well-nourished. No distress.  HENT:  Head: Normocephalic and atraumatic.  Eyes: Conjunctivae and EOM are normal. Pupils are equal, round, and reactive to light.  Neck: Normal range of motion. Neck supple. No thyromegaly present.  Cardiovascular: Normal rate, regular rhythm, normal heart sounds and intact distal pulses.  No murmur heard. Pulmonary/Chest: Effort normal and breath sounds normal. No respiratory distress.  Abdominal: Soft. She exhibits no distension. There is no tenderness.  Musculoskeletal: She exhibits no edema.  Lymphadenopathy:    She has no cervical adenopathy.  Neurological: She is alert and oriented to person, place, and time.  Skin: Skin is warm and dry.  Psychiatric: She has a normal mood and affect. Her behavior is normal.  Vitals reviewed.         Assessment & Plan:

## 2017-03-16 NOTE — Patient Instructions (Signed)
Schedule your complete physical for Feb and your Medicare Wellness Visit at the same time [facility] notify you of your lab results and make any changes if needed Continue to work on healthy diet and regular exercise- you can do it! Call with any questions or concerns Happy Thanksgiving!!!

## 2017-03-16 NOTE — Assessment & Plan Note (Signed)
Chronic problem.  Tolerating medications w/o difficulty.  Pt reports home CBGs are excellent.  No changes to neuropathy sxs.  Stressed need for healthy diet and regular exercise.  Check labs.  Adjust meds prn

## 2017-04-13 ENCOUNTER — Encounter: Payer: Self-pay | Admitting: Family Medicine

## 2017-04-14 MED ORDER — INSULIN PEN NEEDLE 31G X 8 MM MISC: 1 refills | Status: DC

## 2017-04-24 ENCOUNTER — Other Ambulatory Visit: Payer: Self-pay | Admitting: Family Medicine

## 2017-05-14 ENCOUNTER — Encounter: Payer: Self-pay | Admitting: Physician Assistant

## 2017-05-14 ENCOUNTER — Ambulatory Visit: Payer: Medicare Other | Admitting: Physician Assistant

## 2017-05-14 ENCOUNTER — Ambulatory Visit: Payer: Self-pay | Admitting: *Deleted

## 2017-05-14 VITALS — BP 160/80 | HR 64 | Temp 97.9°F | Resp 14 | Ht 62.0 in | Wt 230.0 lb

## 2017-05-14 DIAGNOSIS — I110 Hypertensive heart disease with heart failure: Secondary | ICD-10-CM

## 2017-05-14 MED ORDER — HYDROCHLOROTHIAZIDE 12.5 MG PO TABS: 12.5000 mg | ORAL_TABLET | Freq: Every day | ORAL | 3 refills | Status: DC

## 2017-05-14 NOTE — Progress Notes (Signed)
Patient presents to clinic today c/o elevated BP measurements over the past week despite taking her Toprol XL and Benicar daily for hypertension. Patient denies chest pain, palpitations, lightheadedness, dizziness, vision changes or frequent headaches. Has noted increased salt intake over the past several weeks. Is not active presently. Has been dealing with added stressors. Is scheduled for bunionectomy Monday morning.    Past Medical History:  Diagnosis Date  . Adenomatous colon polyp    hyperplastic  . Anginal pain ([place])    not recent  . Arthritis    NECK, KNEES, FINGERS, TOES  . Atrial tachycardia ([place]) CARDIOLOGIST - DR Caryl Comes (LAST VISIT AUG 2012)   Echo 12/11: EF 55-60%, Mild LVH, grade 1 diast dysfxn;   holter 1/12: ATach  . Atypical chest pain    a. 07/2004 Cath: Clean cors;  b. 04/2010 Myoview: EF 63%, no ischemia  . Blood transfusion without reported diagnosis 1969  . Chronic kidney disease   . Diabetes mellitus, type 2 (HCC)    ORAL AND INSULIN MEDS (LAST A1C  7.3)  . Diverticulosis   . Erythema CURRENT-- CLOSED ABD. WALL ABSCESS   PER PCP NOTE (03-31-11)- MRSA-- TAKES DOXYCYCLINE  . GERD (gastroesophageal reflux disease)    CONTROLLED W/ PREVACID  . History of kidney stones 2012  . Hyperlipidemia   . Hypertension   . Insomnia    TAKES MEDS PRN  . Neuropathy, peripheral   . Obesity (BMI 30-39.9) 02/19/2015  . Pneumonia    as child  . Post op infection 11/07/12   left bunionectomy  . Restless leg syndrome   . Sepsis, urinary HISTORY - 2004  . Sleep apnea   . Vitamin D deficiency     Current Outpatient Medications on File Prior to Visit  Medication Sig Dispense Refill  . aspirin EC 81 MG tablet Take 81 mg by mouth daily.    Marland Kitchen atorvastatin (LIPITOR) 20 MG tablet TAKE 1 TABLET BY MOUTH  DAILY 90 tablet 1  . celecoxib (CELEBREX) 200 MG capsule Take 200 mg by mouth every morning.    . citalopram (CELEXA) 20 MG tablet Take 20 mg by mouth daily.    . clonazePAM  (KLONOPIN) 1 MG tablet Take 1 tablet (1 mg total) by mouth at bedtime. 1 tab nightly for restless leg. 90 tablet 0  . clotrimazole-betamethasone (LOTRISONE) cream Apply 1 application topically 2 (two) times daily. 45 g 0  . diphenoxylate-atropine (LOMOTIL) 2.5-0.025 MG per tablet Take 1 tablet by mouth 4 (four) times daily as needed for diarrhea or loose stools. 30 tablet 0  . fenofibrate 160 MG tablet TAKE 1 TABLET BY MOUTH  DAILY 90 tablet 1  . furosemide (LASIX) 20 MG tablet Take 1 tablet (20 mg total) by mouth daily. 90 tablet 3  . gabapentin (NEURONTIN) 300 MG capsule TAKE 1 CAPSULE BY MOUTH TWO TIMES DAILY 180 capsule 1  . HUMALOG KWIKPEN 100 UNIT/ML KiwkPen INJECT 35 UNITS  SUBCUTANEOUSLY 3 TIMES  DAILY AS DIRECTED 105 mL 1  . HYDROcodone-acetaminophen (NORCO/VICODIN) 5-325 MG tablet Take 1 tablet by mouth every 4 (four) hours as needed. 16 tablet 0  . hydrocortisone valerate cream (WESTCORT) 0.2 % Apply 1 application topically as needed (for skin irritation).    . Insulin Pen Needle (B-D ULTRAFINE III SHORT PEN) 31G X 8 MM MISC USE FOUR TIMES A DAY 360 each 1  . JARDIANCE 10 MG TABS tablet TAKE 1 TABLET BY MOUTH  DAILY 90 tablet 1  . lansoprazole (PREVACID)  15 MG capsule Take 15 mg by mouth daily.    Marland Kitchen LANTUS SOLOSTAR 100 UNIT/ML Solostar Pen INJECT SUBCUTANEOUSLY 80  UNITS DAILY AT 10PM 60 mL 6  . levothyroxine (SYNTHROID, LEVOTHROID) 50 MCG tablet TAKE 1 TABLET BY MOUTH  DAILY BEFORE BREAKFAST 90 tablet 1  . loperamide (IMODIUM) 2 MG capsule Take 2 mg by mouth 4 (four) times daily as needed for diarrhea or loose stools.    . metoprolol succinate (TOPROL-XL) 50 MG 24 hr tablet TAKE 1 TABLET BY MOUTH 2  TIMES DAILY. TAKE WITH OR  IMMEDIATELY FOLLOWING A  MEAL. 180 tablet 2  . olmesartan (BENICAR) 40 MG tablet TAKE 1 TABLET BY MOUTH  DAILY 90 tablet 1  . ondansetron (ZOFRAN) 4 MG tablet Take 1 tablet (4 mg total) by mouth every 8 (eight) hours as needed for nausea or vomiting. 12 tablet 0  .  ONETOUCH DELICA LANCETS 01S MISC USE FOUR TIMES A DAY 360 each 1  . ONETOUCH VERIO test strip TEST 4 TIMES DAILY 400 each 1  . oxybutynin (DITROPAN-XL) 10 MG 24 hr tablet Take 10 mg daily by mouth.  11  . Oxycodone HCl 10 MG TABS TAKE 1-2 TABLETS BY MOUTH EVERY 4 HOURS PRN FOR PAIN  0  . traZODone (DESYREL) 50 MG tablet TAKE 1 TABLET BY MOUTH AT  BEDTIME AS NEEDED FOR SLEEP 90 tablet 1  . nitrofurantoin, macrocrystal-monohydrate, (MACROBID) 100 MG capsule Take 100 mg by mouth as needed (for urinary tract infection). Take 1 capsule by mouth twice daily for 7 days    . promethazine (PHENERGAN) 25 MG tablet Take 1 tablet by mouth as needed.     No current facility-administered medications on file prior to visit.     No Known Allergies  Family History  Problem Relation Age of Onset  . Heart attack Father   . Hyperlipidemia Mother   . Hypertension Mother   . Heart disease Sister   . Hypertension Sister   . Colon polyps Sister   . Hypertension Sister   . Colon polyps Sister   . Hypertension Sister   . Hypertension Sister   . Lung cancer Sister 62       stage 4   . Diabetes Unknown   . Breast cancer Unknown   . Heart disease Unknown   . Colon cancer Other 38       nephew  . Esophageal cancer Neg Hx   . Rectal cancer Neg Hx   . Stomach cancer Neg Hx   . Amblyopia Neg Hx   . Blindness Neg Hx   . Cataracts Neg Hx   . Glaucoma Neg Hx   . Retinal detachment Neg Hx   . Strabismus Neg Hx   . Retinitis pigmentosa Neg Hx     Social History   Socioeconomic History  . Marital status: Married    Spouse name: None  . Number of children: 2  . Years of education: None  . Highest education level: None  Social Needs  . Financial resource strain: None  . Food insecurity - worry: None  . Food insecurity - inability: None  . Transportation needs - medical: None  . Transportation needs - non-medical: None  Occupational History  . Occupation: Engineer, materials middle school    Employer: [employer]    Comment: in office  Tobacco Use  . Smoking status: Current Every Day Smoker    Packs/day: 0.25    Years: 43.00    Pack  years: 10.75    Types: Cigarettes  . Smokeless tobacco: Never Used  . Tobacco comment: 0.75ppd x64yrs, 0.25ppd x7yr (07/12/15). Counseled to quit -smoking, nicotine replacement and smoking cessation classes discussed-3-4 a day as of 09-06-15  Substance and Sexual Activity  . Alcohol use: No    Alcohol/week: 0.0 oz  . Drug use: No  . Sexual activity: None  Other Topics Concern  . None  Social History Narrative   2 children, 2 stepchildren    Review of Systems - See HPI.  All other ROS are negative.  BP (!) 160/80   Pulse 64   Temp 97.9 F (36.6 C) (Oral)   Resp 14   Ht 5\' 2"  (1.575 m)   Wt 230 lb (104.3 kg)   LMP 06/06/1993   SpO2 97%   BMI 42.07 kg/m   Physical Exam  Constitutional: She is oriented to person, place, and time and well-developed, well-nourished, and in no distress.  HENT:  Head: Normocephalic and atraumatic.  Eyes: Conjunctivae are normal.  Neck: Neck supple.  Cardiovascular: Normal rate, regular rhythm, normal heart sounds and intact distal pulses.  Pulmonary/Chest: Effort normal and breath sounds normal. No respiratory distress. She has no wheezes. She has no rales. She exhibits no tenderness.  Neurological: She is alert and oriented to person, place, and time.  Skin: Skin is warm and dry.  Psychiatric: Affect normal.  Vitals reviewed.  Recent Results (from the past 2160 hour(s))  Basic metabolic panel     Status: Abnormal   Collection Time: 03/16/17 10:07 AM  Result Value Ref Range   Sodium 140 135 - 145 mEq/L   Potassium 5.2 (H) 3.5 - 5.1 mEq/L   Chloride 106 96 - 112 mEq/L   CO2 28 19 - 32 mEq/L   Glucose, Bld 104 (H) 70 - 99 mg/dL   BUN 14 6 - 23 mg/dL   Creatinine, Ser 0.99 0.40 - 1.20 mg/dL   Calcium 9.2 8.4 - 10.5 mg/dL   GFR 59.49 (L) >60.00 mL/min  Hemoglobin A1c     Status: Abnormal   Collection Time:  03/16/17 10:07 AM  Result Value Ref Range   Hgb A1c MFr Bld 7.4 (H) 4.6 - 6.5 %    Comment: Glycemic Control Guidelines for People with Diabetes:Non Diabetic:  <6%Goal of Therapy: <7%Additional Action Suggested:  >8%     Assessment/Plan: 1. Benign hypertensive heart disease with heart failure (HCC) Discussed DASH diet. Handout given. Stress relief tactics reviewed. Continue Toprol XL and Benicar. Will add on HCTZ 12.5 mg daily. Close follow-up scheduled with PCP for repeat assessment of BP.  - hydrochlorothiazide (HYDRODIURIL) 12.5 MG tablet; Take 1 tablet (12.5 mg total) by mouth daily.  Dispense: 30 tablet; Refill: 3    Leeanne Rio, [place]

## 2017-05-14 NOTE — Patient Instructions (Addendum)
Please stay well-hydrated. Avoid processed and heavy foods as they contain a lot of salt. Follow the dietary recommendations below. Start the HCTZ 12.5 mg daily. Follow-up in 1-2 weeks for repeat assessment. Check BP at home over the weekend. Numbers should start coming down.    DASH Eating Plan DASH stands for "Dietary Approaches to Stop Hypertension." The DASH eating plan is a healthy eating plan that has been shown to reduce high blood pressure (hypertension). It may also reduce your risk for type 2 diabetes, heart disease, and stroke. The DASH eating plan may also help with weight loss. What are tips for following this plan? General guidelines  Avoid eating more than 2,300 mg (milligrams) of salt (sodium) a day. If you have hypertension, you may need to reduce your sodium intake to 1,500 mg a day.  Limit alcohol intake to no more than 1 drink a day for nonpregnant women and 2 drinks a day for men. One drink equals 12 oz of beer, 5 oz of wine, or 1 oz of hard liquor.  Work with your health care provider to maintain a healthy body weight or to lose weight. Ask what an ideal weight is for you.  Get at least 30 minutes of exercise that causes your heart to beat faster (aerobic exercise) most days of the week. Activities may include walking, swimming, or biking.  Work with your health care provider or diet and nutrition specialist (dietitian) to adjust your eating plan to your individual calorie needs. Reading food labels  Check food labels for the amount of sodium per serving. Choose foods with less than 5 percent of the Daily Value of sodium. Generally, foods with less than 300 mg of sodium per serving fit into this eating plan.  To find whole grains, look for the word "whole" as the first word in the ingredient list. Shopping  Buy products labeled as "low-sodium" or "no salt added."  Buy fresh foods. Avoid canned foods and premade or frozen meals. Cooking  Avoid adding salt when  cooking. Use salt-free seasonings or herbs instead of table salt or sea salt. Check with your health care provider or pharmacist before using salt substitutes.  Do not fry foods. Cook foods using healthy methods such as baking, boiling, grilling, and broiling instead.  Cook with heart-healthy oils, such as olive, canola, soybean, or sunflower oil. Meal planning   Eat a balanced diet that includes: ? 5 or more servings of fruits and vegetables each day. At each meal, try to fill half of your plate with fruits and vegetables. ? Up to 6-8 servings of whole grains each day. ? Less than 6 oz of lean meat, poultry, or fish each day. A 3-oz serving of meat is about the same size as a deck of cards. One egg equals 1 oz. ? 2 servings of low-fat dairy each day. ? A serving of nuts, seeds, or beans 5 times each week. ? Heart-healthy fats. Healthy fats called Omega-3 fatty acids are found in foods such as flaxseeds and coldwater fish, like sardines, salmon, and mackerel.  Limit how much you eat of the following: ? Canned or prepackaged foods. ? Food that is high in trans fat, such as fried foods. ? Food that is high in saturated fat, such as fatty meat. ? Sweets, desserts, sugary drinks, and other foods with added sugar. ? Full-fat dairy products.  Do not salt foods before eating.  Try to eat at least 2 vegetarian meals each week.  Eat more  home-cooked food and less restaurant, buffet, and fast food.  When eating at a restaurant, ask that your food be prepared with less salt or no salt, if possible. What foods are recommended? The items listed may not be a complete list. Talk with your dietitian about what dietary choices are best for you. Grains Whole-grain or whole-wheat bread. Whole-grain or whole-wheat pasta. Brown rice. [place]. Bulgur. Whole-grain and low-sodium cereals. Pita bread. Low-fat, low-sodium crackers. Whole-wheat flour tortillas. Vegetables Fresh or frozen vegetables  (raw, steamed, roasted, or grilled). Low-sodium or reduced-sodium tomato and vegetable juice. Low-sodium or reduced-sodium tomato sauce and tomato paste. Low-sodium or reduced-sodium canned vegetables. Fruits All fresh, dried, or frozen fruit. Canned fruit in natural juice (without added sugar). Meat and other protein foods Skinless chicken or [place]. Ground chicken or [place]. Pork with fat trimmed off. Fish and seafood. Egg whites. Dried beans, peas, or lentils. Unsalted nuts, nut butters, and seeds. Unsalted canned beans. Lean cuts of beef with fat trimmed off. Low-sodium, lean deli meat. Dairy Low-fat (1%) or fat-free (skim) milk. Fat-free, low-fat, or reduced-fat cheeses. Nonfat, low-sodium ricotta or cottage cheese. Low-fat or nonfat yogurt. Low-fat, low-sodium cheese. Fats and oils Soft margarine without trans fats. Vegetable oil. Low-fat, reduced-fat, or light mayonnaise and salad dressings (reduced-sodium). Canola, safflower, olive, soybean, and sunflower oils. Avocado. Seasoning and other foods Herbs. Spices. Seasoning mixes without salt. Unsalted popcorn and pretzels. Fat-free sweets. What foods are not recommended? The items listed may not be a complete list. Talk with your dietitian about what dietary choices are best for you. Grains Baked goods made with fat, such as croissants, muffins, or some breads. Dry pasta or rice meal packs. Vegetables Creamed or fried vegetables. Vegetables in a cheese sauce. Regular canned vegetables (not low-sodium or reduced-sodium). Regular canned tomato sauce and paste (not low-sodium or reduced-sodium). Regular tomato and vegetable juice (not low-sodium or reduced-sodium). Angie Fava. Olives. Fruits Canned fruit in a light or heavy syrup. Fried fruit. Fruit in cream or butter sauce. Meat and other protein foods Fatty cuts of meat. Ribs. Fried meat. Berniece Salines. Sausage. Bologna and other processed lunch meats. Salami. Fatback. Hotdogs. Bratwurst. Salted nuts  and seeds. Canned beans with added salt. Canned or smoked fish. Whole eggs or egg yolks. Chicken or [place] with skin. Dairy Whole or 2% milk, cream, and half-and-half. Whole or full-fat cream cheese. Whole-fat or sweetened yogurt. Full-fat cheese. Nondairy creamers. Whipped toppings. Processed cheese and cheese spreads. Fats and oils Butter. Stick margarine. Lard. Shortening. Ghee. Bacon fat. Tropical oils, such as coconut, palm kernel, or palm oil. Seasoning and other foods Salted popcorn and pretzels. Onion salt, garlic salt, seasoned salt, table salt, and sea salt. Worcestershire sauce. Tartar sauce. Barbecue sauce. Teriyaki sauce. Soy sauce, including reduced-sodium. Steak sauce. Canned and packaged gravies. Fish sauce. Oyster sauce. Cocktail sauce. Horseradish that you find on the shelf. Ketchup. Mustard. Meat flavorings and tenderizers. Bouillon cubes. Hot sauce and Tabasco sauce. Premade or packaged marinades. Premade or packaged taco seasonings. Relishes. Regular salad dressings. Where to find more information:  National Heart, Lung, and [place]: https://wilson-eaton.com/  American Heart Association: www.heart.org Summary  The DASH eating plan is a healthy eating plan that has been shown to reduce high blood pressure (hypertension). It may also reduce your risk for type 2 diabetes, heart disease, and stroke.  With the DASH eating plan, you should limit salt (sodium) intake to 2,300 mg a day. If you have hypertension, you may need to reduce your sodium intake to 1,500  mg a day.  When on the DASH eating plan, aim to eat more fresh fruits and vegetables, whole grains, lean proteins, low-fat dairy, and heart-healthy fats.  Work with your health care provider or diet and nutrition specialist (dietitian) to adjust your eating plan to your individual calorie needs. This information is not intended to replace advice given to you by your health care provider. Make sure you discuss any questions  you have with your health care provider. Document Released: 04/09/2011 Document Revised: 04/13/2016 Document Reviewed: 04/13/2016 Elsevier Interactive Patient Education  [employer].

## 2017-05-14 NOTE — Telephone Encounter (Signed)
[place] [employer] developer phoned in with Lacey [name] on the line. She reported Lacey [name] has been experienced high blood pressure readings since Tuesday and has began experiencing dizziness when she moves from sitting to standing. Lacey [name] reported on/off headaches and some blurred vision at times. Today's B/P 170/106 and 156/110. Reason for Disposition . Systolic BP  >= 409 OR Diastolic >= 811  Answer Assessment - Initial Assessment Questions 1. BLOOD PRESSURE: "What is the blood pressure?" "Did you take at least two measurements 5 minutes apart?"    170/106, 186/115 2. ONSET: "When did you take your blood pressure?"     This am 3. HOW: "How did you obtain the blood pressure?" (e.g., visiting nurse, automatic home BP monitor)     Home monitor 4. HISTORY: "Do you have a history of high blood pressure?"     yes 5. MEDICATIONS: "Are you taking any medications for blood pressure?" "Have you missed any doses recently?"    yes 6. OTHER SYMPTOMS: "Do you have any symptoms?" (e.g., headache, chest pain, blurred vision, difficulty breathing, weakness)   Blurred vision on and off. 7. PREGNANCY: "Is there any chance you are pregnant?" "When was your last menstrual period?"    no  Protocols used: HIGH BLOOD PRESSURE-A-AH

## 2017-05-17 HISTORY — PX: BUNIONECTOMY: SHX129

## 2017-05-17 MED FILL — OXYCODONE-ACETAMINOPHEN 5-3: 5-325 | 5 days supply | Qty: 30 | Fill #0

## 2017-05-31 ENCOUNTER — Other Ambulatory Visit: Payer: Self-pay | Admitting: Family Medicine

## 2017-05-31 NOTE — Telephone Encounter (Signed)
Medication filled to pharmacy as requested.   

## 2017-05-31 NOTE — Telephone Encounter (Signed)
Last OV 03/16/17 Clonazepam last filled 01/18/17 #90 with 0

## 2017-06-08 NOTE — Progress Notes (Addendum)
Subjective:   Lacey [name] is a 67 y.o. female who presents for Medicare Annual (Subsequent) preventive examination.  Review of Systems:  No ROS.  Medicare Wellness Visit. Additional risk factors are reflected in the social history.  Cardiac Risk Factors include: advanced age (>32men, >46 women);diabetes mellitus;dyslipidemia;hypertension;family history of premature cardiovascular disease;obesity (BMI >30kg/m2);smoking/ tobacco exposure;sedentary lifestyle   Sleep patterns: Sleeps 6 hours. Difficulty falling asleep. Voids x 2.  Home Safety/Smoke Alarms: Feels safe in home. Smoke alarms in place.  Living environment; residence and Firearm Safety: Lives with husband in 1 story home.  Seat Belt Safety/Bike Helmet: Wears seat belt.   Female:   [id number]       Mammo-12/18/2016, benign. [facility].        Dexa scan-11/24/2011. Ordered today, [facility].         CCS-Colonoscopy 09/20/2015, polyp. Recall 5 years.      Objective:     Vitals: BP 140/70 (BP Location: Left Arm, Patient Position: Sitting, Cuff Size: Large)   Pulse 62   Temp 98.1 F (36.7 C) (Temporal)   Resp 18   Ht 5\' 2"  (1.575 m)   Wt 232 lb 3.2 oz (105.3 kg)   LMP 06/06/1993   SpO2 97%   BMI 42.47 kg/m   Body mass index is 42.47 kg/m.  Advanced Directives 06/09/2017 07/08/2016 09/06/2015 10/19/2014 11/25/2012 04/06/2011 04/02/2011  Does Patient Have a Medical Advance Directive? Yes No No No Patient does not have advance directive;Patient would not like information Patient does not have advance directive (No Data)  Type of Advance Directive Living will;Healthcare Power of Attorney - - - - - -  [place] in Chart? No - copy requested - - - - - -  Would patient like information on creating a medical advance directive? - No - Patient declined - No - patient declined information - - -  Pre-existing out of facility DNR order (yellow form or pink MOST form) - - - - No No -    Tobacco Social History   Tobacco  Use  Smoking Status Current Every Day Smoker  . Packs/day: 0.25  . Years: 43.00  . Pack years: 10.75  . Types: Cigarettes  Smokeless Tobacco Never Used  Tobacco Comment   0.75ppd x47yrs, 0.25ppd x38yr (07/12/15). Counseled to quit -smoking, nicotine replacement and smoking cessation classes discussed-3-4 a day as of 09-06-15     Ready to quit: No Counseling given: No Comment: 0.75ppd x46yrs, 0.25ppd x60yr (07/12/15). Counseled to quit -smoking, nicotine replacement and smoking cessation classes discussed-3-4 a day as of 09-06-15    Past Medical History:  Diagnosis Date  . Adenomatous colon polyp    hyperplastic  . Anginal pain ([place])    not recent  . Arthritis    NECK, KNEES, FINGERS, TOES  . Atrial tachycardia ([place]) CARDIOLOGIST - DR Caryl Comes (LAST VISIT AUG 2012)   Echo 12/11: EF 55-60%, Mild LVH, grade 1 diast dysfxn;   holter 1/12: ATach  . Atypical chest pain    a. 07/2004 Cath: Clean cors;  b. 04/2010 Myoview: EF 63%, no ischemia  . Blood transfusion without reported diagnosis 1969  . Chronic kidney disease   . Diabetes mellitus, type 2 (HCC)    ORAL AND INSULIN MEDS (LAST A1C  7.3)  . Diverticulosis   . Erythema CURRENT-- CLOSED ABD. WALL ABSCESS   PER PCP NOTE (03-31-11)- MRSA-- TAKES DOXYCYCLINE  . GERD (gastroesophageal reflux disease)    CONTROLLED W/ PREVACID  . History  of kidney stones 2012  . Hyperlipidemia   . Hypertension   . Insomnia    TAKES MEDS PRN  . Neuropathy, peripheral   . Obesity (BMI 30-39.9) 02/19/2015  . Pneumonia    as child  . Post op infection 11/07/12   left bunionectomy  . Restless leg syndrome   . Sepsis, urinary HISTORY - 2004  . Sleep apnea   . Vitamin D deficiency    Past Surgical History:  Procedure Laterality Date  . ABDOMINAL HYSTERECTOMY  1995   ovaries remain  . BUNIONECTOMY Left 10/24/12  . BUNIONECTOMY Right 05/17/2017  . CARDIAC CATHETERIZATION  07/10/04  . CARPAL TUNNEL RELEASE  2000   RIGHT  . CERVICAL FUSION  10-12-07   C5  - 7  . COLONOSCOPY    . CYSTOSCOPY WITH RETROGRADE PYELOGRAM, URETEROSCOPY AND STENT PLACEMENT Left 11/28/2012   Procedure: LEFT RETROGRADE PYELOGRAM, LEFT URETEROSCOPY, ;  Surgeon: Claybon Jabs, MD;  Location: [facility];  Service: Urology;  Laterality: Left;  . CYSTOSCOPY/RETROGRADE/URETEROSCOPY/STONE EXTRACTION WITH BASKET  X2 2004 & 2009   LEFT   . GASTRIC BYPASS  1981  . KNEE ARTHROSCOPY  AUG 2012   LEFT  . LAPAROSCOPIC CHOLECYSTECTOMY  2001  . left thumb joint surgery  2013  . PERCUTANEOUS NEPHROSTOLITHOTOMY  02-27-11   LEFT  . POLYPECTOMY    . RIGHT THUMB JOINT SURG.   MAY 2011  . TRIGGER FINGER RELEASE  2010   RIGHT THUMB  . UPPER GASTROINTESTINAL ENDOSCOPY    . URETERAL STENT PLACEMENT  X2  2004  &  2009   LEFT  . URETEROSCOPY  04/06/2011   Procedure: URETEROSCOPY;  Surgeon: Claybon Jabs, MD;  Location: [facility];  Service: Urology;  Laterality: Left;  L Ureteroscopy Laser Litho & Stent    Family History  Problem Relation Age of Onset  . Heart attack Father   . Hyperlipidemia Mother   . Hypertension Mother   . Heart disease Sister   . Hypertension Sister   . Colon polyps Sister   . Hypertension Sister   . Colon polyps Sister   . Hypertension Sister   . Hypertension Sister   . Lung cancer Sister 58       stage 4   . Diabetes Unknown   . Breast cancer Unknown   . Heart disease Unknown   . Colon cancer Other 48       nephew  . Esophageal cancer Neg Hx   . Rectal cancer Neg Hx   . Stomach cancer Neg Hx   . Amblyopia Neg Hx   . Blindness Neg Hx   . Cataracts Neg Hx   . Glaucoma Neg Hx   . Retinal detachment Neg Hx   . Strabismus Neg Hx   . Retinitis pigmentosa Neg Hx    Social History   Socioeconomic History  . Marital status: Married    Spouse name: None  . Number of children: 2  . Years of education: None  . Highest education level: None  Social Needs  . Financial resource strain: None  . Food insecurity - worry: None  . Food  insecurity - inability: None  . Transportation needs - medical: None  . Transportation needs - non-medical: None  Occupational History  . Occupation: Engineer, materials middle school    Employer: [employer]    Comment: in office  Tobacco Use  . Smoking status: Current Every Day Smoker    Packs/day: 0.25  Years: 43.00    Pack years: 10.75    Types: Cigarettes  . Smokeless tobacco: Never Used  . Tobacco comment: 0.75ppd x72yrs, 0.25ppd x64yr (07/12/15). Counseled to quit -smoking, nicotine replacement and smoking cessation classes discussed-3-4 a day as of 09-06-15  Substance and Sexual Activity  . Alcohol use: No    Alcohol/week: 0.0 oz  . Drug use: No  . Sexual activity: None  Other Topics Concern  . None  Social History Narrative   2 children, 2 stepchildren    Outpatient Encounter Medications as of 06/09/2017  Medication Sig  . aspirin EC 81 MG tablet Take 81 mg by mouth daily.  Marland Kitchen atorvastatin (LIPITOR) 20 MG tablet TAKE 1 TABLET BY MOUTH  DAILY  . celecoxib (CELEBREX) 200 MG capsule Take 200 mg by mouth every morning.  . clonazePAM (KLONOPIN) 1 MG tablet TAKE 1 TABLET BY MOUTH  NIGHTLY AT BEDTIME FOR  RESTLESS LEG  . clotrimazole-betamethasone (LOTRISONE) cream Apply 1 application topically 2 (two) times daily.  . diphenoxylate-atropine (LOMOTIL) 2.5-0.025 MG per tablet Take 1 tablet by mouth 4 (four) times daily as needed for diarrhea or loose stools.  . fenofibrate 160 MG tablet TAKE 1 TABLET BY MOUTH  DAILY  . gabapentin (NEURONTIN) 300 MG capsule TAKE 1 CAPSULE BY MOUTH TWO TIMES DAILY  . HUMALOG KWIKPEN 100 UNIT/ML KiwkPen INJECT 35 UNITS  SUBCUTANEOUSLY 3 TIMES  DAILY AS DIRECTED  . hydrochlorothiazide (HYDRODIURIL) 12.5 MG tablet Take 1 tablet (12.5 mg total) by mouth daily.  . hydrocortisone valerate cream (WESTCORT) 0.2 % Apply 1 application topically as needed (for skin irritation).  . Insulin Pen Needle (B-D ULTRAFINE III SHORT PEN) 31G X 8 MM MISC USE FOUR TIMES A DAY    . JARDIANCE 10 MG TABS tablet TAKE 1 TABLET BY MOUTH  DAILY  . lansoprazole (PREVACID) 15 MG capsule Take 15 mg by mouth daily.  Marland Kitchen LANTUS SOLOSTAR 100 UNIT/ML Solostar Pen INJECT SUBCUTANEOUSLY 80  UNITS DAILY AT 10PM  . levothyroxine (SYNTHROID, LEVOTHROID) 50 MCG tablet TAKE 1 TABLET BY MOUTH  DAILY BEFORE BREAKFAST  . metoprolol succinate (TOPROL-XL) 50 MG 24 hr tablet TAKE 1 TABLET BY MOUTH 2  TIMES DAILY. TAKE WITH OR  IMMEDIATELY FOLLOWING A  MEAL.  . nitrofurantoin, macrocrystal-monohydrate, (MACROBID) 100 MG capsule Take 100 mg by mouth as needed (for urinary tract infection). Take 1 capsule by mouth twice daily for 7 days  . olmesartan (BENICAR) 40 MG tablet TAKE 1 TABLET BY MOUTH  DAILY  . ondansetron (ZOFRAN) 4 MG tablet Take 1 tablet (4 mg total) by mouth every 8 (eight) hours as needed for nausea or vomiting.  Glory Rosebush DELICA LANCETS 28N MISC USE FOUR TIMES A DAY  . ONETOUCH VERIO test strip TEST 4 TIMES DAILY  . promethazine (PHENERGAN) 25 MG tablet Take 1 tablet by mouth as needed.  . traZODone (DESYREL) 50 MG tablet TAKE 1 TABLET BY MOUTH AT  BEDTIME AS NEEDED FOR SLEEP  . [DISCONTINUED] loperamide (IMODIUM) 2 MG capsule Take 2 mg by mouth 4 (four) times daily as needed for diarrhea or loose stools.  . citalopram (CELEXA) 20 MG tablet Take 20 mg by mouth daily.  . furosemide (LASIX) 20 MG tablet Take 1 tablet (20 mg total) by mouth daily.  Marland Kitchen HYDROcodone-acetaminophen (NORCO/VICODIN) 5-325 MG tablet Take 1 tablet by mouth every 4 (four) hours as needed.  Marland Kitchen oxybutynin (DITROPAN-XL) 10 MG 24 hr tablet Take 10 mg daily by mouth.  . Oxycodone HCl 10  MG TABS TAKE 1-2 TABLETS BY MOUTH EVERY 4 HOURS PRN FOR PAIN  . Zoster Vaccine Adjuvanted [facility]) injection Inject 0.5 mLs into the muscle once for 1 dose.   No facility-administered encounter medications on file as of 06/09/2017.     Activities of Daily Living In your present state of health, do you have any difficulty performing  the following activities: 06/09/2017 03/16/2017  Hearing? N N  Vision? N N  Difficulty concentrating or making decisions? N N  Walking or climbing stairs? N N  Dressing or bathing? N N  Doing errands, shopping? N N  Preparing Food and eating ? N N  Using the Toilet? N N  In the past six months, have you accidently leaked urine? Y N  Do you have problems with loss of bowel control? N N  Managing your Medications? N N  Managing your Finances? N N  Housekeeping or managing your Housekeeping? N N  Some recent data might be hidden    Patient Care Team: Midge Minium, MD as PCP - General Deboraha Sprang, MD as Consulting Physician (Cardiology) Almedia Balls, MD as Consulting Physician (Orthopedic Surgery) Dyke Maes, [place] as Consulting Physician (Optometry) Irene Shipper, MD as Consulting Physician (Gastroenterology) Ceasar Mons, MD as Consulting Physician (Urology) [place], Amy, MD as Consulting Physician (Dermatology)    Assessment:   This is a routine wellness examination for Nattalie.  Exercise Activities and Dietary recommendations Current Exercise Habits: The patient does not participate in regular exercise at present(Housework), Exercise limited by: None identified   Diet (meal preparation, eat out, water intake, caffeinated beverages, dairy products, fruits and vegetables): Drinks water and caffeine free diet soda  Breakfast: eggs/toast; cereal; pancake Lunch: snacks fruit Dinner: protein and veggies     Goals    . Weight (lb) < 200 lb (90.7 kg)     Lose weight by being as active as possible and watching carb intake.        Fall Risk Fall Risk  06/09/2017 03/16/2017 12/29/2016 06/04/2016 05/27/2016  Falls in the past year? No No No No No    Depression Screen PHQ 2/9 Scores 06/09/2017 03/16/2017 12/29/2016 07/16/2016  PHQ - 2 Score 0 0 0 0  PHQ- 9 Score - 0 0 0  Exception Documentation - - - -     Cognitive Function MMSE - Mini Mental State Exam  06/09/2017  Orientation to time 5  Orientation to Place 5  Registration 3  Attention/ Calculation 5  Recall 3  Language- name 2 objects 2  Language- repeat 1  Language- follow 3 step command 3  Language- read & follow direction 1  Write a sentence 1  Copy design 1  Total score 30        Immunization History  Administered Date(s) Administered  . Influenza Split 02/05/2011, 02/26/2012  . Influenza Whole 02/05/2009, 01/20/2010  . Influenza,inj,Quad PF,6+ Mos 01/24/2013, 01/15/2014, 01/22/2015, 01/23/2016, 12/29/2016  . Pneumococcal Conjugate-13 04/17/2014  . Pneumococcal Polysaccharide-23 06/05/2007, 02/06/2016  . Tdap 06/22/2011  . Zoster 03/24/2012    Screening Tests Health Maintenance  Topic Date Due  . HEMOGLOBIN A1C  09/13/2017  . FOOT EXAM  09/17/2017  . MAMMOGRAM  12/18/2017  . OPHTHALMOLOGY EXAM  02/03/2018  . COLONOSCOPY  09/19/2020  . TETANUS/TDAP  06/21/2021  . INFLUENZA VACCINE  Completed  . DEXA SCAN  Completed  . Hepatitis C Screening  Completed  . PNA vac Low Risk Adult  Completed  Plan:     Shingles vaccine at pharmacy.  Schedule bone scan.   Bring a copy of your living will and/or healthcare power of attorney to your next office visit.  Continue doing brain stimulating activities (puzzles, reading, adult coloring books, staying active) to keep memory sharp.   I have personally reviewed and noted the following in the patient's chart:   . Medical and social history . Use of alcohol, tobacco or illicit drugs  . Current medications and supplements . Functional ability and status . Nutritional status . Physical activity . Advanced directives . List of other physicians . Hospitalizations, surgeries, and ER visits in previous 12 months . Vitals . Screenings to include cognitive, depression, and falls . Referrals and appointments  In addition, I have reviewed and discussed with patient certain preventive protocols, quality metrics, and  best practice recommendations. A written personalized care plan for preventive services as well as general preventive health recommendations were provided to patient.     Gerilyn Nestle, RN  06/09/2017  Reviewed documentation provided by RN and agree w/ above.  Annye Asa, MD

## 2017-06-09 ENCOUNTER — Encounter: Payer: Self-pay | Admitting: Family Medicine

## 2017-06-09 ENCOUNTER — Other Ambulatory Visit: Payer: Self-pay

## 2017-06-09 ENCOUNTER — Ambulatory Visit (INDEPENDENT_AMBULATORY_CARE_PROVIDER_SITE_OTHER): Payer: Medicare Other | Admitting: Family Medicine

## 2017-06-09 ENCOUNTER — Ambulatory Visit: Payer: Medicare Other

## 2017-06-09 VITALS — BP 130/78 | HR 62 | Temp 98.1°F | Resp 18 | Ht 62.0 in | Wt 232.1 lb

## 2017-06-09 VITALS — BP 140/70 | HR 62 | Temp 98.1°F | Resp 18 | Ht 62.0 in | Wt 232.2 lb

## 2017-06-09 DIAGNOSIS — Z0000 Encounter for general adult medical examination without abnormal findings: Secondary | ICD-10-CM | POA: Diagnosis not present

## 2017-06-09 DIAGNOSIS — E1165 Type 2 diabetes mellitus with hyperglycemia: Secondary | ICD-10-CM | POA: Diagnosis not present

## 2017-06-09 DIAGNOSIS — E559 Vitamin D deficiency, unspecified: Secondary | ICD-10-CM

## 2017-06-09 DIAGNOSIS — E11319 Type 2 diabetes mellitus with unspecified diabetic retinopathy without macular edema: Secondary | ICD-10-CM

## 2017-06-09 DIAGNOSIS — E2839 Other primary ovarian failure: Secondary | ICD-10-CM

## 2017-06-09 DIAGNOSIS — IMO0002 Reserved for concepts with insufficient information to code with codable children: Secondary | ICD-10-CM

## 2017-06-09 DIAGNOSIS — Z23 Encounter for immunization: Secondary | ICD-10-CM

## 2017-06-09 LAB — CBC WITH DIFFERENTIAL/PLATELET
Basophils Absolute: 0.1 10*3/uL (ref 0.0–0.1)
Basophils Relative: 0.7 % (ref 0.0–3.0)
EOS PCT: 3.8 % (ref 0.0–5.0)
Eosinophils Absolute: 0.4 10*3/uL (ref 0.0–0.7)
HEMATOCRIT: 43.5 % (ref 36.0–46.0)
HEMOGLOBIN: 14.6 g/dL (ref 12.0–15.0)
Lymphocytes Relative: 27.3 % (ref 12.0–46.0)
Lymphs Abs: 2.7 10*3/uL (ref 0.7–4.0)
MCHC: 33.5 g/dL (ref 30.0–36.0)
MCV: 92 fl (ref 78.0–100.0)
MONO ABS: 0.8 10*3/uL (ref 0.1–1.0)
Monocytes Relative: 7.6 % (ref 3.0–12.0)
Neutro Abs: 6 10*3/uL (ref 1.4–7.7)
Neutrophils Relative %: 60.6 % (ref 43.0–77.0)
Platelets: 185 10*3/uL (ref 150.0–400.0)
RBC: 4.73 Mil/uL (ref 3.87–5.11)
RDW: 13.8 % (ref 11.5–15.5)
WBC: 9.9 10*3/uL (ref 4.0–10.5)

## 2017-06-09 LAB — HEPATIC FUNCTION PANEL
ALBUMIN: 4.2 g/dL (ref 3.5–5.2)
ALT: 14 U/L (ref 0–35)
AST: 17 U/L (ref 0–37)
Alkaline Phosphatase: 63 U/L (ref 39–117)
Bilirubin, Direct: 0.1 mg/dL (ref 0.0–0.3)
Total Bilirubin: 0.4 mg/dL (ref 0.2–1.2)
Total Protein: 6.6 g/dL (ref 6.0–8.3)

## 2017-06-09 LAB — LIPID PANEL
Cholesterol: 118 mg/dL (ref 0–200)
HDL: 34.6 mg/dL — AB (ref >39.00)
LDL Cholesterol: 44 mg/dL (ref 0–99)
NONHDL: 83.73
Total CHOL/HDL Ratio: 3
Triglycerides: 198 mg/dL — ABNORMAL HIGH (ref 0.0–149.0)
VLDL: 39.6 mg/dL (ref 0.0–40.0)

## 2017-06-09 LAB — VITAMIN D 25 HYDROXY (VIT D DEFICIENCY, FRACTURES): VITD: 20.32 ng/mL — AB (ref 30.00–100.00)

## 2017-06-09 LAB — HEMOGLOBIN A1C: HEMOGLOBIN A1C: 7 % — AB (ref 4.6–6.5)

## 2017-06-09 LAB — BASIC METABOLIC PANEL
BUN: 18 mg/dL (ref 6–23)
CHLORIDE: 106 meq/L (ref 96–112)
CO2: 29 meq/L (ref 19–32)
CREATININE: 0.9 mg/dL (ref 0.40–1.20)
Calcium: 9.1 mg/dL (ref 8.4–10.5)
GFR: 66.36 mL/min (ref >60.00)
Glucose, Bld: 86 mg/dL (ref 70–99)
Potassium: 4.4 mEq/L (ref 3.5–5.1)
Sodium: 141 mEq/L (ref 135–145)

## 2017-06-09 LAB — TSH: TSH: 1.59 u[IU]/mL (ref 0.35–4.50)

## 2017-06-09 MED ORDER — ZOSTER VAC RECOMB ADJUVANTED 50 MCG/0.5ML IM SUSR: 0.5000 mL | Freq: Once | INTRAMUSCULAR | 1 refills | Status: AC

## 2017-06-09 NOTE — Assessment & Plan Note (Signed)
Check labs and replete prn. 

## 2017-06-09 NOTE — Assessment & Plan Note (Signed)
Ongoing issue.  Stressed need for healthy diet and regular exercise.  Will continue to follow. 

## 2017-06-09 NOTE — Assessment & Plan Note (Signed)
Chronic problem.  Currently asymptomatic.  UTD on eye exam, foot exam.  On ARB for renal protection.  Stressed need for healthy diet and regular exercise.  Check labs.  Adjust meds prn

## 2017-06-09 NOTE — Patient Instructions (Addendum)
Shingles vaccine at pharmacy.  Schedule bone scan.   Bring a copy of your living will and/or healthcare power of attorney to your next office visit.  Continue doing brain stimulating activities (puzzles, reading, adult coloring books, staying active) to keep memory sharp.    Fall Prevention in the Home Falls can cause injuries. They can happen to people of all ages. There are many things you can do to make your home safe and to help prevent falls. What can I do on the outside of my home?  Regularly fix the edges of walkways and driveways and fix any cracks.  Remove anything that might make you trip as you walk through a door, such as a raised step or threshold.  Trim any bushes or trees on the path to your home.  Use bright outdoor lighting.  Clear any walking paths of anything that might make someone trip, such as rocks or tools.  Regularly check to see if handrails are loose or broken. Make sure that both sides of any steps have handrails.  Any raised decks and porches should have guardrails on the edges.  Have any leaves, snow, or ice cleared regularly.  Use sand or salt on walking paths during winter.  Clean up any spills in your garage right away. This includes oil or grease spills. What can I do in the bathroom?  Use night lights.  Install grab bars by the toilet and in the tub and shower. Do not use towel bars as grab bars.  Use non-skid mats or decals in the tub or shower.  If you need to sit down in the shower, use a plastic, non-slip stool.  Keep the floor dry. Clean up any water that spills on the floor as soon as it happens.  Remove soap buildup in the tub or shower regularly.  Attach bath mats securely with double-sided non-slip rug tape.  Do not have throw rugs and other things on the floor that can make you trip. What can I do in the bedroom?  Use night lights.  Make sure that you have a light by your bed that is easy to reach.  Do not use any  sheets or blankets that are too big for your bed. They should not hang down onto the floor.  Have a firm chair that has side arms. You can use this for support while you get dressed.  Do not have throw rugs and other things on the floor that can make you trip. What can I do in the kitchen?  Clean up any spills right away.  Avoid walking on wet floors.  Keep items that you use a lot in easy-to-reach places.  If you need to reach something above you, use a strong step stool that has a grab bar.  Keep electrical cords out of the way.  Do not use floor polish or wax that makes floors slippery. If you must use wax, use non-skid floor wax.  Do not have throw rugs and other things on the floor that can make you trip. What can I do with my stairs?  Do not leave any items on the stairs.  Make sure that there are handrails on both sides of the stairs and use them. Fix handrails that are broken or loose. Make sure that handrails are as long as the stairways.  Check any carpeting to make sure that it is firmly attached to the stairs. Fix any carpet that is loose or worn.  Avoid having throw  rugs at the top or bottom of the stairs. If you do have throw rugs, attach them to the floor with carpet tape.  Make sure that you have a light switch at the top of the stairs and the bottom of the stairs. If you do not have them, ask someone to add them for you. What else can I do to help prevent falls?  Wear shoes that: ? Do not have high heels. ? Have rubber bottoms. ? Are comfortable and fit you well. ? Are closed at the toe. Do not wear sandals.  If you use a stepladder: ? Make sure that it is fully opened. Do not climb a closed stepladder. ? Make sure that both sides of the stepladder are locked into place. ? Ask someone to hold it for you, if possible.  Clearly mark and make sure that you can see: ? Any grab bars or handrails. ? First and last steps. ? Where the edge of each step  is.  Use tools that help you move around (mobility aids) if they are needed. These include: ? Canes. ? Walkers. ? Scooters. ? Crutches.  Turn on the lights when you go into a dark area. Replace any light bulbs as soon as they burn out.  Set up your furniture so you have a clear path. Avoid moving your furniture around.  If any of your floors are uneven, fix them.  If there are any pets around you, be aware of where they are.  Review your medicines with your doctor. Some medicines can make you feel dizzy. This can increase your chance of falling. Ask your doctor what other things that you can do to help prevent falls. This information is not intended to replace advice given to you by your health care provider. Make sure you discuss any questions you have with your health care provider. Document Released: 02/14/2009 Document Revised: 09/26/2015 Document Reviewed: 05/25/2014 Elsevier Interactive Patient Education  2018 [employer] Maintenance, Female Adopting a healthy lifestyle and getting preventive care can go a long way to promote health and wellness. Talk with your health care provider about what schedule of regular examinations is right for you. This is a good chance for you to check in with your provider about disease prevention and staying healthy. In between checkups, there are plenty of things you can do on your own. Experts have done a lot of research about which lifestyle changes and preventive measures are most likely to keep you healthy. Ask your health care provider for more information. Weight and diet Eat a healthy diet  Be sure to include plenty of vegetables, fruits, low-fat dairy products, and lean protein.  Do not eat a lot of foods high in solid fats, added sugars, or salt.  Get regular exercise. This is one of the most important things you can do for your health. ? Most adults should exercise for at least 150 minutes each week. The exercise should  increase your heart rate and make you sweat (moderate-intensity exercise). ? Most adults should also do strengthening exercises at least twice a week. This is in addition to the moderate-intensity exercise.  Maintain a healthy weight  Body mass index (BMI) is a measurement that can be used to identify possible weight problems. It estimates body fat based on height and weight. Your health care provider can help determine your BMI and help you achieve or maintain a healthy weight.  For females 33 years of age and older: ? A  BMI below 18.5 is considered underweight. ? A BMI of 18.5 to 24.9 is normal. ? A BMI of 25 to 29.9 is considered overweight. ? A BMI of 30 and above is considered obese.  Watch levels of cholesterol and blood lipids  You should start having your blood tested for lipids and cholesterol at 67 years of age, then have this test every 5 years.  You may need to have your cholesterol levels checked more often if: ? Your lipid or cholesterol levels are high. ? You are older than 67 years of age. ? You are at high risk for heart disease.  Cancer screening Lung Cancer  Lung cancer screening is recommended for adults 36-42 years old who are at high risk for lung cancer because of a history of smoking.  A yearly low-dose CT scan of the lungs is recommended for people who: ? Currently smoke. ? Have quit within the past 15 years. ? Have at least a 30-pack-year history of smoking. A pack year is smoking an average of one pack of cigarettes a day for 1 year.  Yearly screening should continue until it has been 15 years since you quit.  Yearly screening should stop if you develop a health problem that would prevent you from having lung cancer treatment.  Breast Cancer  Practice breast self-awareness. This means understanding how your breasts normally appear and feel.  It also means doing regular breast self-exams. Let your health care provider know about any changes, no matter  how small.  If you are in your 20s or 30s, you should have a clinical breast exam (CBE) by a health care provider every 1-3 years as part of a regular health exam.  If you are 42 or older, have a CBE every year. Also consider having a breast X-ray (mammogram) every year.  If you have a family history of breast cancer, talk to your health care provider about genetic screening.  If you are at high risk for breast cancer, talk to your health care provider about having an MRI and a mammogram every year.  Breast cancer gene (BRCA) assessment is recommended for women who have family members with BRCA-related cancers. BRCA-related cancers include: ? Breast. ? Ovarian. ? Tubal. ? Peritoneal cancers.  Results of the assessment will determine the need for genetic counseling and BRCA1 and BRCA2 testing.  Cervical Cancer Your health care provider may recommend that you be screened regularly for cancer of the pelvic organs (ovaries, uterus, and vagina). This screening involves a pelvic examination, including checking for microscopic changes to the surface of your cervix (Pap test). You may be encouraged to have this screening done every 3 years, beginning at age 43.  For women ages 38-65, health care providers may recommend pelvic exams and Pap testing every 3 years, or they may recommend the Pap and pelvic exam, combined with testing for human papilloma virus (HPV), every 5 years. Some types of HPV increase your risk of cervical cancer. Testing for HPV may also be done on women of any age with unclear Pap test results.  Other health care providers may not recommend any screening for nonpregnant women who are considered low risk for pelvic cancer and who do not have symptoms. Ask your health care provider if a screening pelvic exam is right for you.  If you have had past treatment for cervical cancer or a condition that could lead to cancer, you need Pap tests and screening for cancer for at least 20  years  after your treatment. If Pap tests have been discontinued, your risk factors (such as having a new sexual partner) need to be reassessed to determine if screening should resume. Some women have medical problems that increase the chance of getting cervical cancer. In these cases, your health care provider may recommend more frequent screening and Pap tests.  Colorectal Cancer  This type of cancer can be detected and often prevented.  Routine colorectal cancer screening usually begins at 67 years of age and continues through 67 years of age.  Your health care provider may recommend screening at an earlier age if you have risk factors for colon cancer.  Your health care provider may also recommend using home test kits to check for hidden blood in the stool.  A small camera at the end of a tube can be used to examine your colon directly (sigmoidoscopy or colonoscopy). This is done to check for the earliest forms of colorectal cancer.  Routine screening usually begins at age 67.  Direct examination of the colon should be repeated every 5-10 years through 67 years of age. However, you may need to be screened more often if early forms of precancerous polyps or small growths are found.  Skin Cancer  Check your skin from head to toe regularly.  Tell your health care provider about any new moles or changes in moles, especially if there is a change in a mole's shape or color.  Also tell your health care provider if you have a mole that is larger than the size of a pencil eraser.  Always use sunscreen. Apply sunscreen liberally and repeatedly throughout the day.  Protect yourself by wearing long sleeves, pants, a wide-brimmed hat, and sunglasses whenever you are outside.  Heart disease, diabetes, and high blood pressure  High blood pressure causes heart disease and increases the risk of stroke. High blood pressure is more likely to develop in: ? People who have blood pressure in the high  end of the normal range (130-139/85-89 mm Hg). ? People who are overweight or obese. ? People who are African American.  If you are 105-76 years of age, have your blood pressure checked every 3-5 years. If you are 38 years of age or older, have your blood pressure checked every year. You should have your blood pressure measured twice-once when you are at a hospital or clinic, and once when you are not at a hospital or clinic. Record the average of the two measurements. To check your blood pressure when you are not at a hospital or clinic, you can use: ? An automated blood pressure machine at a pharmacy. ? A home blood pressure monitor.  If you are between 62 years and 50 years old, ask your health care provider if you should take aspirin to prevent strokes.  Have regular diabetes screenings. This involves taking a blood sample to check your fasting blood sugar level. ? If you are at a normal weight and have a low risk for diabetes, have this test once every three years after 67 years of age. ? If you are overweight and have a high risk for diabetes, consider being tested at a younger age or more often. Preventing infection Hepatitis B  If you have a higher risk for hepatitis B, you should be screened for this virus. You are considered at high risk for hepatitis B if: ? You were born in a country where hepatitis B is common. Ask your health care provider which countries are considered high  risk. ? Your parents were born in a high-risk country, and you have not been immunized against hepatitis B (hepatitis B vaccine). ? You have HIV or AIDS. ? You use needles to inject street drugs. ? You live with someone who has hepatitis B. ? You have had sex with someone who has hepatitis B. ? You get hemodialysis treatment. ? You take certain medicines for conditions, including cancer, organ transplantation, and autoimmune conditions.  Hepatitis C  Blood testing is recommended for: ? Everyone born from  [birth year] through 1965. ? Anyone with known risk factors for hepatitis C.  Sexually transmitted infections (STIs)  You should be screened for sexually transmitted infections (STIs) including gonorrhea and chlamydia if: ? You are sexually active and are younger than 67 years of age. ? You are older than 67 years of age and your health care provider tells you that you are at risk for this type of infection. ? Your sexual activity has changed since you were last screened and you are at an increased risk for chlamydia or gonorrhea. Ask your health care provider if you are at risk.  If you do not have HIV, but are at risk, it may be recommended that you take a prescription medicine daily to prevent HIV infection. This is called pre-exposure prophylaxis (PrEP). You are considered at risk if: ? You are sexually active and do not regularly use condoms or know the HIV status of your partner(s). ? You take drugs by injection. ? You are sexually active with a partner who has HIV.  Talk with your health care provider about whether you are at high risk of being infected with HIV. If you choose to begin PrEP, you should first be tested for HIV. You should then be tested every 3 months for as long as you are taking PrEP. Pregnancy  If you are premenopausal and you may become pregnant, ask your health care provider about preconception counseling.  If you may become pregnant, take 400 to 800 micrograms (mcg) of folic acid every day.  If you want to prevent pregnancy, talk to your health care provider about birth control (contraception). Osteoporosis and menopause  Osteoporosis is a disease in which the bones lose minerals and strength with aging. This can result in serious bone fractures. Your risk for osteoporosis can be identified using a bone density scan.  If you are 1 years of age or older, or if you are at risk for osteoporosis and fractures, ask your health care provider if you should be  screened.  Ask your health care provider whether you should take a calcium or vitamin D supplement to lower your risk for osteoporosis.  Menopause may have certain physical symptoms and risks.  Hormone replacement therapy may reduce some of these symptoms and risks. Talk to your health care provider about whether hormone replacement therapy is right for you. Follow these instructions at home:  Schedule regular health, dental, and eye exams.  Stay current with your immunizations.  Do not use any tobacco products including cigarettes, chewing tobacco, or electronic cigarettes.  If you are pregnant, do not drink alcohol.  If you are breastfeeding, limit how much and how often you drink alcohol.  Limit alcohol intake to no more than 1 drink per day for nonpregnant women. One drink equals 12 ounces of beer, 5 ounces of wine, or 1 ounces of hard liquor.  Do not use street drugs.  Do not share needles.  Ask your health care provider for help  if you need support or information about quitting drugs.  Tell your health care provider if you often feel depressed.  Tell your health care provider if you have ever been abused or do not feel safe at home. This information is not intended to replace advice given to you by your health care provider. Make sure you discuss any questions you have with your health care provider. Document Released: 11/03/2010 Document Revised: 09/26/2015 Document Reviewed: 01/22/2015 Elsevier Interactive Patient Education  [employer].

## 2017-06-09 NOTE — Patient Instructions (Signed)
Follow up in 3-4 months to recheck diabetes We'll notify you of your lab results and make any changes if needed Continue to work on healthy diet and regular exercise- you can do it! Call with any questions or concerns Happy Valentine's Day!!! 

## 2017-06-09 NOTE — Assessment & Plan Note (Signed)
Pt's PE WNL w/ exception of obesity.  UTD on mammo, colonoscopy.  DEXA ordered.  UTD on immunizations.  Check labs.  Anticipatory guidance provided.

## 2017-06-09 NOTE — Progress Notes (Signed)
   Subjective:    Patient ID: Lacey [name], female    DOB: 23-Dec-1950, 67 y.o.   MRN: [id number]  HPI CPE- UTD on mammo, colonoscopy, eye exam, foot exam, immunizations.  DEXA ordered.   Review of Systems Patient reports no vision/ hearing changes, adenopathy,fever, weight change,  persistant/recurrent hoarseness , swallowing issues, chest pain, palpitations, edema, persistant/recurrent cough, hemoptysis, dyspnea (rest/exertional/paroxysmal nocturnal), gastrointestinal bleeding (melena, rectal bleeding), abdominal pain, significant heartburn, bowel changes, GU symptoms (dysuria, hematuria, incontinence), Gyn symptoms (abnormal  bleeding, pain),  syncope, focal weakness, memory loss, numbness & tingling, skin/hair/nail changes, abnormal bruising or bleeding, anxiety, or depression.     Objective:   Physical Exam General Appearance:    Alert, cooperative, no distress, appears stated age, obese  Head:    Normocephalic, without obvious abnormality, atraumatic  Eyes:    PERRL, conjunctiva/corneas clear, EOM's intact, fundi    benign, both eyes  Ears:    Normal TM's and external ear canals, both ears  Nose:   Nares normal, septum midline, mucosa normal, no drainage    or sinus tenderness  Throat:   Lips, mucosa, and tongue normal; teeth and gums normal  Neck:   Supple, symmetrical, trachea midline, no adenopathy;    Thyroid: no enlargement/tenderness/nodules  Back:     Symmetric, no curvature, ROM normal, no CVA tenderness  Lungs:     Clear to auscultation bilaterally, respirations unlabored  Chest Wall:    No tenderness or deformity   Heart:    Regular rate and rhythm, S1 and S2 normal, no murmur, rub   or gallop  Breast Exam:    Deferred to mammo  Abdomen:     Soft, non-tender, bowel sounds active all four quadrants,    no masses, no organomegaly  Genitalia:    Deferred  Rectal:    Extremities:   Extremities normal, atraumatic, no cyanosis or edema  Pulses:   2+ and symmetric all  extremities  Skin:   Skin color, texture, turgor normal, no rashes or lesions  Lymph nodes:   Cervical, supraclavicular, and axillary nodes normal  Neurologic:   CNII-XII intact, normal strength, sensation and reflexes    throughout          Assessment & Plan:

## 2017-06-10 ENCOUNTER — Other Ambulatory Visit: Payer: Self-pay | Admitting: General Practice

## 2017-06-10 MED ORDER — VITAMIN D (ERGOCALCIFEROL) 1.25 MG (50000 UNIT) PO CAPS: 50000.0000 [IU] | ORAL_CAPSULE | ORAL | 0 refills | Status: DC

## 2017-06-14 LAB — HM DEXA SCAN: HM Dexa Scan: NORMAL

## 2017-06-22 ENCOUNTER — Telehealth: Payer: Self-pay | Admitting: Family Medicine

## 2017-06-22 NOTE — Telephone Encounter (Signed)
Called requesting last Hgb A1C, BP at last OV, and date of pneumonia vaccine

## 2017-07-19 ENCOUNTER — Other Ambulatory Visit: Payer: Self-pay | Admitting: Family Medicine

## 2017-07-27 ENCOUNTER — Encounter: Payer: Self-pay | Admitting: General Practice

## 2017-07-29 ENCOUNTER — Ambulatory Visit (INDEPENDENT_AMBULATORY_CARE_PROVIDER_SITE_OTHER)
Admission: RE | Admit: 2017-07-29 | Discharge: 2017-07-29 | Disposition: A | Discharge status: Home / Self Care | Payer: Medicare Other | Source: Ambulatory Visit | Attending: Acute Care | Admitting: Acute Care

## 2017-07-29 DIAGNOSIS — F17210 Nicotine dependence, cigarettes, uncomplicated: Secondary | ICD-10-CM

## 2017-07-31 ENCOUNTER — Other Ambulatory Visit: Payer: Self-pay | Admitting: Family Medicine

## 2017-08-04 ENCOUNTER — Other Ambulatory Visit: Payer: Self-pay | Admitting: Acute Care

## 2017-08-04 DIAGNOSIS — Z122 Encounter for screening for malignant neoplasm of respiratory organs: Secondary | ICD-10-CM

## 2017-08-04 DIAGNOSIS — F17210 Nicotine dependence, cigarettes, uncomplicated: Principal | ICD-10-CM

## 2017-08-18 ENCOUNTER — Telehealth: Payer: Self-pay | Admitting: Family Medicine

## 2017-08-18 NOTE — Telephone Encounter (Signed)
Copied from [place] [id number]. Topic: General - Other >> Aug 18, 2017  9:53 AM Darl Householder, RMA wrote: Reason for CRM: Patient is requesting a call back , pt did not disclose but would like for Levada Dy to call her back

## 2017-08-22 ENCOUNTER — Other Ambulatory Visit: Payer: Self-pay | Admitting: Family Medicine

## 2017-08-31 ENCOUNTER — Other Ambulatory Visit: Payer: Self-pay | Admitting: Family Medicine

## 2017-09-01 ENCOUNTER — Other Ambulatory Visit: Payer: Self-pay

## 2017-09-01 ENCOUNTER — Encounter: Payer: Self-pay | Admitting: Family Medicine

## 2017-09-01 ENCOUNTER — Ambulatory Visit: Payer: Medicare Other | Admitting: Family Medicine

## 2017-09-01 VITALS — BP 133/84 | HR 69 | Resp 16 | Ht 62.0 in | Wt 236.0 lb

## 2017-09-01 DIAGNOSIS — IMO0002 Reserved for concepts with insufficient information to code with codable children: Secondary | ICD-10-CM

## 2017-09-01 DIAGNOSIS — G4700 Insomnia, unspecified: Secondary | ICD-10-CM

## 2017-09-01 DIAGNOSIS — E11319 Type 2 diabetes mellitus with unspecified diabetic retinopathy without macular edema: Secondary | ICD-10-CM

## 2017-09-01 DIAGNOSIS — E1165 Type 2 diabetes mellitus with hyperglycemia: Secondary | ICD-10-CM | POA: Diagnosis not present

## 2017-09-01 LAB — BASIC METABOLIC PANEL
BUN: 18 mg/dL (ref 6–23)
CHLORIDE: 104 meq/L (ref 96–112)
CO2: 28 mEq/L (ref 19–32)
CREATININE: 0.83 mg/dL (ref 0.40–1.20)
Calcium: 9.2 mg/dL (ref 8.4–10.5)
GFR: 72.81 mL/min (ref >60.00)
Glucose, Bld: 87 mg/dL (ref 70–99)
POTASSIUM: 4.8 meq/L (ref 3.5–5.1)
SODIUM: 140 meq/L (ref 135–145)

## 2017-09-01 LAB — HEMOGLOBIN A1C: Hgb A1c MFr Bld: 6.9 % — ABNORMAL HIGH (ref 4.6–6.5)

## 2017-09-01 MED ORDER — FREESTYLE LIBRE 14 DAY SENSOR MISC: 1.0000 | 12 refills | Status: DC

## 2017-09-01 MED ORDER — FREESTYLE LIBRE 14 DAY READER DEVI: 1.0000 | Freq: Every day | 1 refills | Status: DC

## 2017-09-01 MED ORDER — TRAZODONE HCL 100 MG PO TABS: 100.0000 mg | ORAL_TABLET | Freq: Every day | ORAL | 1 refills | Status: DC

## 2017-09-01 NOTE — Patient Instructions (Addendum)
Follow up in 4 months to recheck sugar We'll notify you of your lab results and make any changes if needed Decrease the Humalog to 35 units 3x/day. Continue to work on healthy diet and regular exercise- you can do it! INCREASE the Trazodone to 100mg  nightly- take 2 of what you have at home and 1 of the new prescription when it arrives Look at your meds and let us know if you need refills Call with any questions or concerns Happy Spring!!!

## 2017-09-01 NOTE — Assessment & Plan Note (Signed)
Chronic problem.  Pt is having symptomatic lows randomly throughout the day- indicating her Humalog dose is likely too high.  Based on this, will decrease to 35 units TID.  Given lows, will attempt to get [facility] monitor for pt.  UTD on eye exam.  Foot exam done today.  On ACE for renal protection.  Check labs.  Further adjustments pending results.  Pt expressed understanding and is in agreement w/ plan.

## 2017-09-01 NOTE — Assessment & Plan Note (Signed)
Increase trazodone to 100mg  and monitor for improvement in sleep.  Pt expressed understanding and is in agreement w/ plan.

## 2017-09-01 NOTE — Progress Notes (Signed)
   Subjective:    Patient ID: Lacey [name], female    DOB: 11-07-1950, 67 y.o.   MRN: [id number]  HPI DM- chronic problem, on Jardiance 10mg  daily, Lantus 80 units nightly, Humalog 40 units TID.  Pt has gained 4 lbs since Feb.  Pt reports frequent symptomatic lows.  Pt had a reading of 40 on Monday.  Lows are occurring at different times daily.  No CP, SOB, HAs, visual changes, edema.  Denies numbness/tingling of hands/feet.  Insomnia- pt reports Trazodone is recently ineffective.  Will take med and go to bed at 10 or 11 but not fall asleep until 2 or 3.   Review of Systems For ROS see HPI     Objective:   Physical Exam  Constitutional: She is oriented to person, place, and time. She appears well-developed and well-nourished. No distress.  obese  HENT:  Head: Normocephalic and atraumatic.  Eyes: Pupils are equal, round, and reactive to light. Conjunctivae and EOM are normal.  Neck: Normal range of motion. Neck supple. No thyromegaly present.  Cardiovascular: Normal rate, regular rhythm, normal heart sounds and intact distal pulses.  No murmur heard. Pulmonary/Chest: Effort normal and breath sounds normal. No respiratory distress.  Abdominal: Soft. She exhibits no distension. There is no tenderness.  Musculoskeletal: She exhibits no edema.  Lymphadenopathy:    She has no cervical adenopathy.  Neurological: She is alert and oriented to person, place, and time.  Skin: Skin is warm and dry.  Psychiatric: She has a normal mood and affect. Her behavior is normal.  Vitals reviewed.         Assessment & Plan:

## 2017-09-02 ENCOUNTER — Encounter: Payer: Self-pay | Admitting: General Practice

## 2017-09-07 ENCOUNTER — Other Ambulatory Visit: Payer: Self-pay | Admitting: General Practice

## 2017-09-07 MED ORDER — VITAMIN D (ERGOCALCIFEROL) 1.25 MG (50000 UNIT) PO CAPS: 50000.0000 [IU] | ORAL_CAPSULE | ORAL | 0 refills | Status: DC

## 2017-09-08 ENCOUNTER — Telehealth: Payer: Self-pay | Admitting: Family Medicine

## 2017-09-08 NOTE — Telephone Encounter (Signed)
Caller name: Elisha Headland & the patient  Relation to pt: diabetic case manager from [facility]  Call back number: pt # [phone] Pharmacy: [id number] - [place], [place] [phone] (Phone) [phone] (Fax)  Reason for call:   olmesartan (BENICAR) 40 MG tablet currently on back order with mail order and retail, patient requesting a 90 day supply alternate [facility] to [facility], patient states she's been without rX for 7 days, please  advise.

## 2017-09-09 MED ORDER — TELMISARTAN 80 MG PO TABS: 80.0000 mg | ORAL_TABLET | Freq: Every day | ORAL | 1 refills | Status: DC

## 2017-09-09 NOTE — Telephone Encounter (Signed)
Please advise 

## 2017-09-09 NOTE — Telephone Encounter (Signed)
Chart updated to reflect change, new med filled to local pharmacy and pt made aware she will contact office if BP is consistently greater than 140/90 and if she develops any HA, SOB or dizziness.

## 2017-09-09 NOTE — Telephone Encounter (Signed)
Ok for Telmisartan 80mg  daily, #90, 1 refill

## 2017-09-20 ENCOUNTER — Encounter: Payer: Self-pay | Admitting: Family Medicine

## 2017-10-11 ENCOUNTER — Other Ambulatory Visit: Payer: Self-pay | Admitting: Internal Medicine

## 2017-10-11 ENCOUNTER — Other Ambulatory Visit: Payer: Self-pay | Admitting: Family Medicine

## 2017-10-11 NOTE — Telephone Encounter (Signed)
Last OV: 09/10/2017 Last Fill: 05/31/2017

## 2017-10-15 IMAGING — CT CT CHEST LUNG CANCER SCREENING LOW DOSE W/O CM
1 of 4 series · 10 of 40 positions shown, 13 images · non-contrast
Comparison: 07/23/2015.

CLINICAL DATA: 66-year-old female with 35 pack year history of
smoking. Lung cancer screening.

EXAM:
CT CHEST WITHOUT CONTRAST LOW-DOSE FOR LUNG CANCER SCREENING
TECHNIQUE: Multidetector CT imaging of the chest was performed following the
standard protocol without IV contrast.

[ct lung segmentation data · axial · 0.71mm/px · z∈[+930,+930]mm · 10 of 318 frames shown]
[frame 1/318  mediastinal]
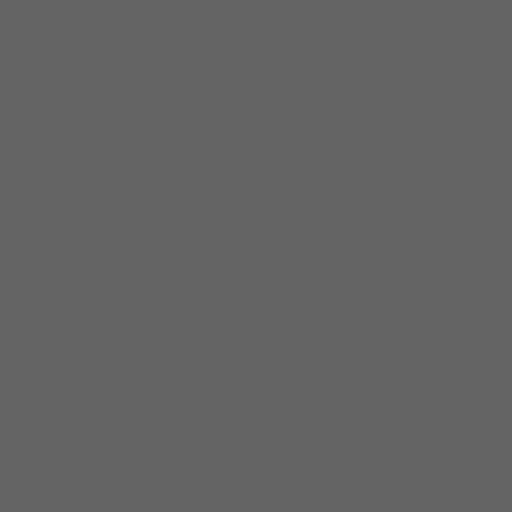
[frame 1/318  lung]
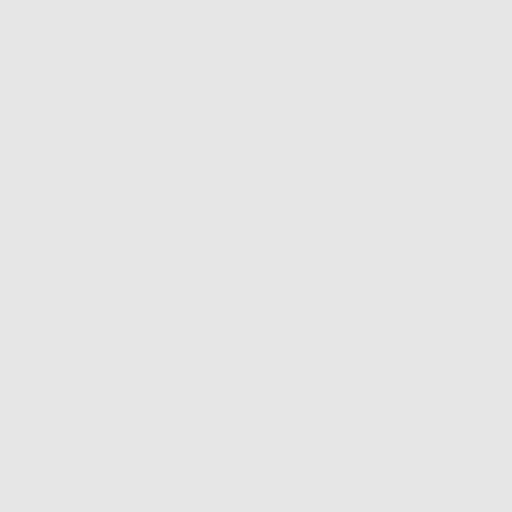
[frame 36/318  lung]
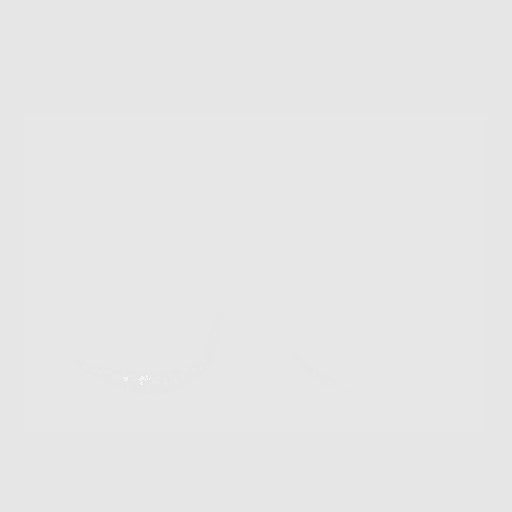
[frame 71/318  lung]
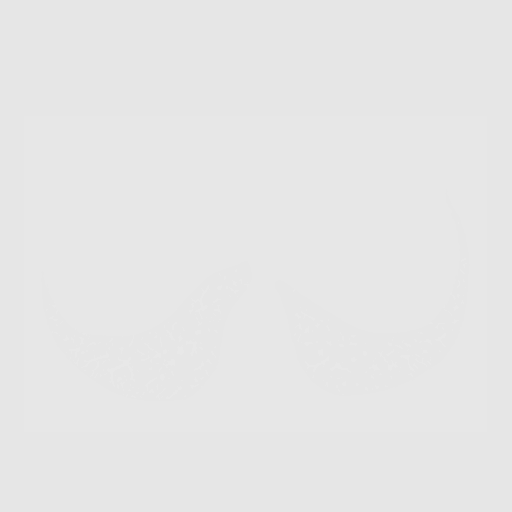
[frame 106/318  lung]
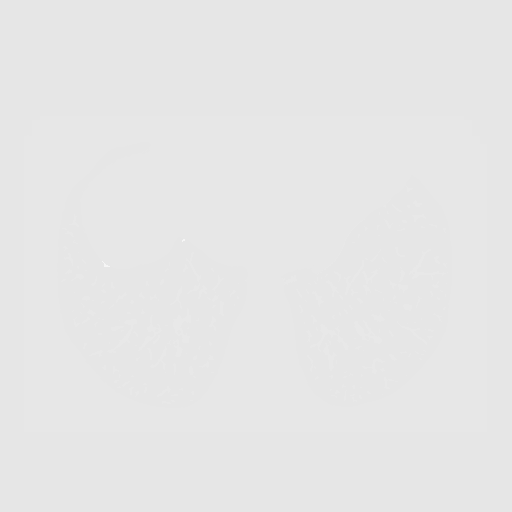
[frame 141/318  mediastinal]
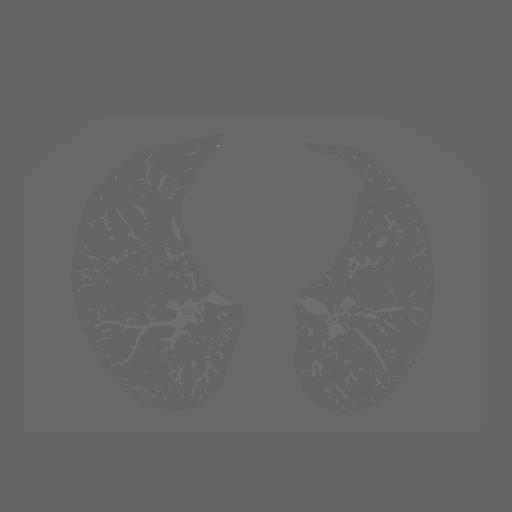
[frame 141/318  lung]
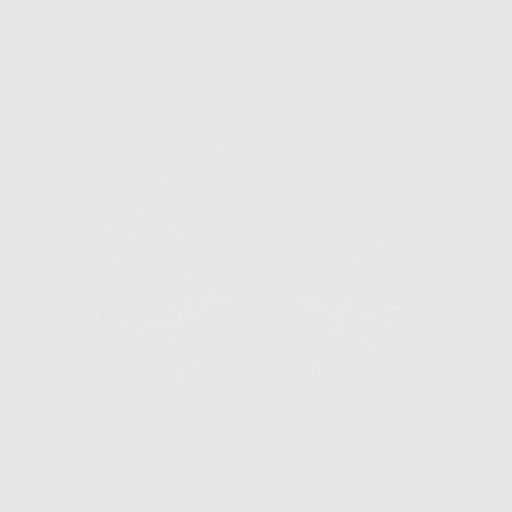
[frame 177/318  lung]
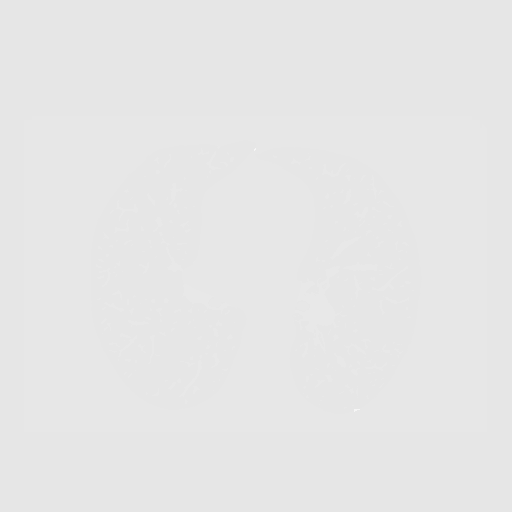
[frame 212/318  lung]
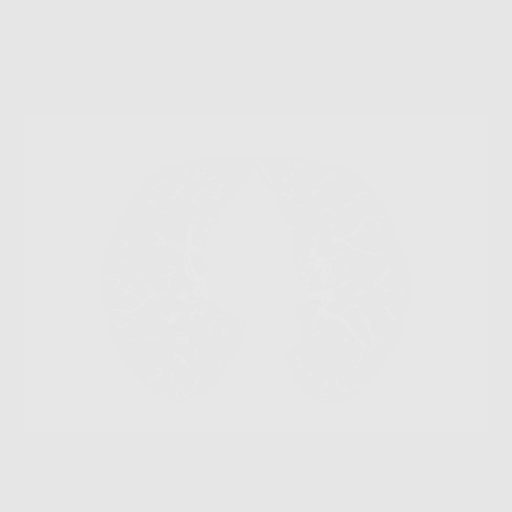
[frame 247/318  lung]
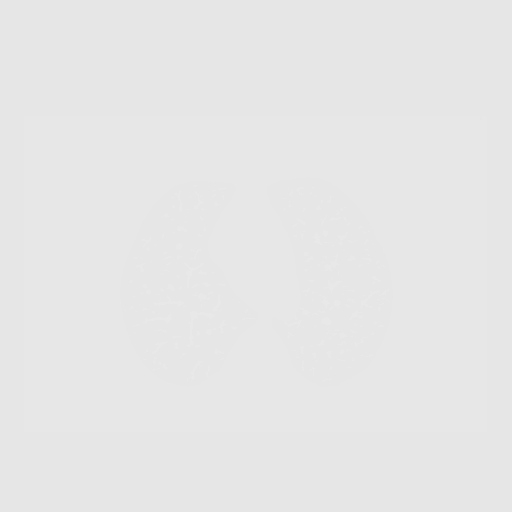
[frame 282/318  mediastinal]
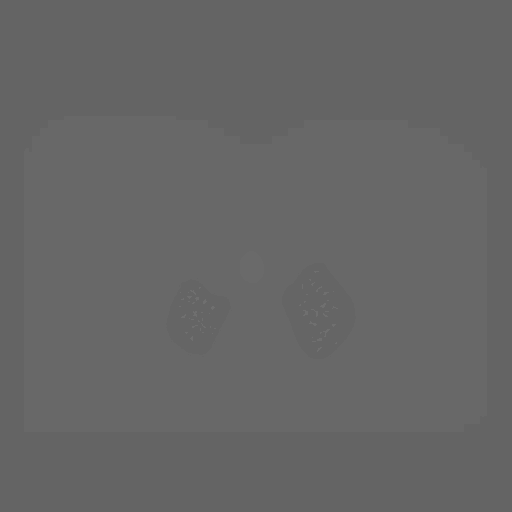
[frame 282/318  lung]
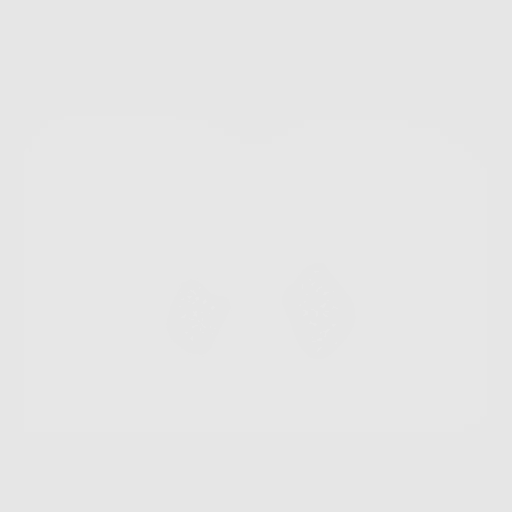
[frame 318/318  lung]
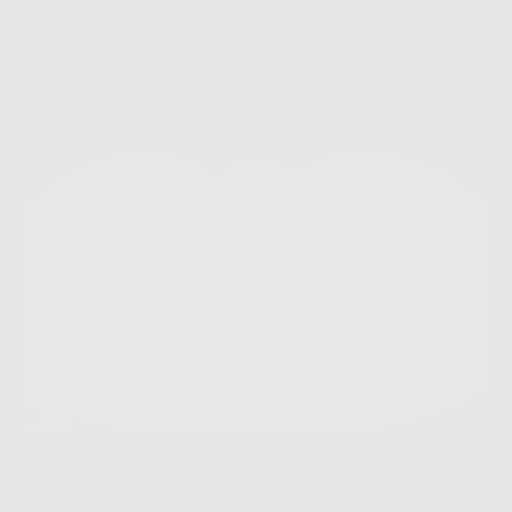

[10 of 40 positions shown; findings below may reference images not displayed]

FINDINGS: Cardiovascular: The heart size is normal. No pericardial effusion.
Coronary artery calcification is noted. Atherosclerotic
calcification is noted in the wall of the thoracic aorta.

Mediastinum/Nodes: No mediastinal lymphadenopathy. No evidence for
gross hilar lymphadenopathy although assessment is limited by the
lack of intravenous contrast on today's study. The esophagus has
normal imaging features. There is no axillary lymphadenopathy.

Lungs/Pleura: Centrilobular emphysema is associated with bronchial
wall thickening. Chronic atelectasis or linear scarring noted in the
right middle lobe. Scattered tiny bilateral pulmonary nodules are
unchanged. No focal airspace consolidation. No pulmonary edema or
pleural effusion.

Upper Abdomen: Postsurgical change noted in the region of the.

Musculoskeletal: Bone windows reveal no worrisome lytic or sclerotic
osseous lesions.
IMPRESSION: 1. Lung-RADS Category 2, benign appearance or behavior. Continue
annual screening with low-dose chest CT without contrast in 12
months.
2.  Emphysema. ([id number]).
3. Coronary artery atherosclerosis.

## 2017-11-01 ENCOUNTER — Encounter: Payer: Self-pay | Admitting: Family Medicine

## 2017-11-15 ENCOUNTER — Other Ambulatory Visit: Payer: Self-pay | Admitting: General Practice

## 2017-11-15 ENCOUNTER — Other Ambulatory Visit: Payer: Self-pay | Admitting: Family Medicine

## 2017-11-15 MED ORDER — TELMISARTAN 80 MG PO TABS: 80.0000 mg | ORAL_TABLET | Freq: Every day | ORAL | 1 refills | Status: DC

## 2017-11-30 ENCOUNTER — Other Ambulatory Visit: Payer: Self-pay | Admitting: Internal Medicine

## 2017-12-14 ENCOUNTER — Ambulatory Visit: Payer: Medicare Other | Admitting: Family Medicine

## 2017-12-14 ENCOUNTER — Other Ambulatory Visit: Payer: Self-pay

## 2017-12-14 ENCOUNTER — Encounter: Payer: Self-pay | Admitting: Family Medicine

## 2017-12-14 VITALS — BP 138/72 | HR 86 | Temp 98.4°F | Resp 16 | Ht 62.0 in | Wt 234.5 lb

## 2017-12-14 DIAGNOSIS — E785 Hyperlipidemia, unspecified: Secondary | ICD-10-CM

## 2017-12-14 DIAGNOSIS — E1169 Type 2 diabetes mellitus with other specified complication: Secondary | ICD-10-CM | POA: Diagnosis not present

## 2017-12-14 DIAGNOSIS — E11319 Type 2 diabetes mellitus with unspecified diabetic retinopathy without macular edema: Secondary | ICD-10-CM

## 2017-12-14 DIAGNOSIS — I110 Hypertensive heart disease with heart failure: Secondary | ICD-10-CM

## 2017-12-14 DIAGNOSIS — IMO0002 Reserved for concepts with insufficient information to code with codable children: Secondary | ICD-10-CM

## 2017-12-14 DIAGNOSIS — E1165 Type 2 diabetes mellitus with hyperglycemia: Secondary | ICD-10-CM | POA: Diagnosis not present

## 2017-12-14 LAB — HEPATIC FUNCTION PANEL
ALT: 13 U/L (ref 0–35)
AST: 15 U/L (ref 0–37)
Albumin: 4.2 g/dL (ref 3.5–5.2)
Alkaline Phosphatase: 58 U/L (ref 39–117)
BILIRUBIN TOTAL: 0.5 mg/dL (ref 0.2–1.2)
Bilirubin, Direct: 0.1 mg/dL (ref 0.0–0.3)
TOTAL PROTEIN: 6.7 g/dL (ref 6.0–8.3)

## 2017-12-14 LAB — BASIC METABOLIC PANEL
BUN: 19 mg/dL (ref 6–23)
CALCIUM: 9.4 mg/dL (ref 8.4–10.5)
CO2: 27 meq/L (ref 19–32)
CREATININE: 1.07 mg/dL (ref 0.40–1.20)
Chloride: 105 mEq/L (ref 96–112)
GFR: 54.27 mL/min — ABNORMAL LOW (ref >60.00)
Glucose, Bld: 189 mg/dL — ABNORMAL HIGH (ref 70–99)
Potassium: 3.8 mEq/L (ref 3.5–5.1)
Sodium: 139 mEq/L (ref 135–145)

## 2017-12-14 LAB — LIPID PANEL
CHOLESTEROL: 135 mg/dL (ref 0–200)
HDL: 36.1 mg/dL — AB (ref >39.00)
LDL Cholesterol: 61 mg/dL (ref 0–99)
NONHDL: 98.61
Total CHOL/HDL Ratio: 4
Triglycerides: 189 mg/dL — ABNORMAL HIGH (ref 0.0–149.0)
VLDL: 37.8 mg/dL (ref 0.0–40.0)

## 2017-12-14 LAB — CBC WITH DIFFERENTIAL/PLATELET
BASOS ABS: 0.1 10*3/uL (ref 0.0–0.1)
Basophils Relative: 1 % (ref 0.0–3.0)
Eosinophils Absolute: 0.1 10*3/uL (ref 0.0–0.7)
Eosinophils Relative: 2 % (ref 0.0–5.0)
HEMATOCRIT: 42.8 % (ref 36.0–46.0)
HEMOGLOBIN: 14.4 g/dL (ref 12.0–15.0)
LYMPHS PCT: 22.2 % (ref 12.0–46.0)
Lymphs Abs: 1.6 10*3/uL (ref 0.7–4.0)
MCHC: 33.7 g/dL (ref 30.0–36.0)
MCV: 93.3 fl (ref 78.0–100.0)
MONOS PCT: 7.2 % (ref 3.0–12.0)
Monocytes Absolute: 0.5 10*3/uL (ref 0.1–1.0)
NEUTROS ABS: 4.9 10*3/uL (ref 1.4–7.7)
Neutrophils Relative %: 67.6 % (ref 43.0–77.0)
Platelets: 169 10*3/uL (ref 150.0–400.0)
RBC: 4.59 Mil/uL (ref 3.87–5.11)
RDW: 14 % (ref 11.5–15.5)
WBC: 7.3 10*3/uL (ref 4.0–10.5)

## 2017-12-14 LAB — HEMOGLOBIN A1C: HEMOGLOBIN A1C: 7.3 % — AB (ref 4.6–6.5)

## 2017-12-14 LAB — TSH: TSH: 1.58 u[IU]/mL (ref 0.35–4.50)

## 2017-12-14 NOTE — Patient Instructions (Signed)
Follow up in 3-4 months to recheck sugar We'll notify you of your lab results and make any changes if needed Continue to work on healthy diet and regular exercise- you can do it! Call with any questions or concerns Hang in there!

## 2017-12-14 NOTE — Assessment & Plan Note (Signed)
Chronic problem.  On Humalog and Lantus w/  Jardiance for cardiac protection.  I suspect her fatigue and low motivation is more depression related than sugar related.  We discussed this and pt will monitor mood and determine whether she is or isn't taking the Celexa (pt is not sure).  Check labs.  Adjust meds prn

## 2017-12-14 NOTE — Progress Notes (Signed)
   Subjective:    Patient ID: Lacey [name], female    DOB: 16-Mar-1951, 67 y.o.   MRN: [id number]  HPI DM- chronic problem, on Humalog 35 units TID, Lantus 80 units daily, Jardiance 10mg  daily w/ hx of good control.  UTD on foot exam, eye exam, on ARB for renal protection.  Known neuropathy- on Gabapentin.  Pt reports ongoing fatigue and no initiative.  Has had 3-4 lows since last visit.  HTN- chronic problem, on Telmisartan 80mg , Metoprolol 50mg  BID, Lasix 20mg , HCTZ 12.5mg  daily  w/ adequate control.  No CP.  + SOB w/ exertion- continues to smoke.  No edema.  Denies HAs.  Hyperlipidemia- chronic problem, on Lipitor 20mg , Fenofibrate 160mg .  No abd pain, N/V.   Review of Systems For ROS see HPI     Objective:   Physical Exam  Constitutional: She is oriented to person, place, and time. She appears well-developed and well-nourished. No distress.  HENT:  Head: Normocephalic and atraumatic.  Eyes: Pupils are equal, round, and reactive to light. Conjunctivae and EOM are normal.  Neck: Normal range of motion. Neck supple. No thyromegaly present.  Cardiovascular: Normal rate, regular rhythm, normal heart sounds and intact distal pulses.  No murmur heard. Pulmonary/Chest: Effort normal and breath sounds normal. No respiratory distress.  Abdominal: Soft. She exhibits no distension. There is no tenderness.  Musculoskeletal: She exhibits no edema.  Lymphadenopathy:    She has no cervical adenopathy.  Neurological: She is alert and oriented to person, place, and time.  Skin: Skin is warm and dry.  Psychiatric: She has a normal mood and affect. Her behavior is normal.  Vitals reviewed.         Assessment & Plan:

## 2017-12-14 NOTE — Assessment & Plan Note (Signed)
Chronic problem.  Tolerating statin w/o difficulty.  Stressed need for healthy diet and regular exercise.  Check labs.  Adjust meds prn  

## 2017-12-14 NOTE — Assessment & Plan Note (Signed)
Chronic problem.  Currently asymptomatic.  Check labs.  No anticipated med changes.  Will follow. 

## 2017-12-16 LAB — HM DIABETES EYE EXAM

## 2017-12-20 ENCOUNTER — Encounter: Payer: Self-pay | Admitting: Family Medicine

## 2017-12-20 LAB — HM MAMMOGRAPHY

## 2017-12-28 ENCOUNTER — Encounter: Payer: Self-pay | Admitting: General Practice

## 2017-12-31 NOTE — Progress Notes (Signed)
[facility] Note  01/04/2018     CHIEF COMPLAINT Patient presents for Retina Follow Up   HISTORY OF PRESENT ILLNESS: Lacey [name] is a 67 y.o. female who presents to the clinic today for:   HPI    Retina Follow Up    Patient presents with  Other.  In both eyes.  This started 1 year ago.  Severity is mild.  Since onset it is stable.  I, the attending physician,  performed the HPI with the patient and updated documentation appropriately.          Comments    F/U DME. Patient states her vision has been blurry ,denies flashes,floaters and ocular pain. BS 95, A1C 7.3, patient ststes BS have been WINL.       Last edited by Bernarda Caffey, MD on 01/04/2018 10:04 AM. (History)     Lab Results  Component Value Date   HGBA1C 7.3 (H) 12/14/2017   Pt states OU VA is stable; Pt reports CBG has been stable; Pt reports she has been relatively healthy this past year;   Referring physician: Midge Minium, MD [address] [place], [place] [address]  HISTORICAL INFORMATION:   Selected notes from the MEDICAL RECORD NUMBER Referral from Dr. Micheline Rough, OD, for diabetic eye exam, concern for cascading exudates approaching macula OD;  Ocular Hx - IDDM type II;  PHM - IDDM type II; HTN; Hypercholesterolemia;    CURRENT MEDICATIONS: No current outpatient medications on file. (Ophthalmic Drugs)   No current facility-administered medications for this visit.  (Ophthalmic Drugs)   Current Outpatient Medications (Other)  Medication Sig  . aspirin EC 81 MG tablet Take 81 mg by mouth daily.  Marland Kitchen atorvastatin (LIPITOR) 20 MG tablet TAKE 1 TABLET BY MOUTH  DAILY  . celecoxib (CELEBREX) 200 MG capsule Take 200 mg by mouth every morning.  . citalopram (CELEXA) 20 MG tablet Take 20 mg by mouth daily.  . clotrimazole-betamethasone (LOTRISONE) cream Apply 1 application topically 2 (two) times daily.  . diphenoxylate-atropine (LOMOTIL) 2.5-0.025 MG per tablet Take 1 tablet  by mouth 4 (four) times daily as needed for diarrhea or loose stools.  . fenofibrate 160 MG tablet TAKE 1 TABLET BY MOUTH  DAILY  . furosemide (LASIX) 20 MG tablet Take 1 tablet (20 mg total) by mouth daily.  Marland Kitchen gabapentin (NEURONTIN) 300 MG capsule TAKE 1 CAPSULE BY MOUTH TWO TIMES DAILY  . HUMALOG KWIKPEN 100 UNIT/ML KiwkPen INJECT 35 UNITS  SUBCUTANEOUSLY 3 TIMES  DAILY AS DIRECTED  . hydrochlorothiazide (HYDRODIURIL) 12.5 MG tablet Take 1 tablet (12.5 mg total) by mouth daily.  Marland Kitchen HYDROcodone-acetaminophen (NORCO/VICODIN) 5-325 MG tablet Take 1 tablet by mouth every 4 (four) hours as needed.  . hydrocortisone valerate cream (WESTCORT) 0.2 % Apply 1 application topically as needed (for skin irritation).  . Insulin Pen Needle (B-D ULTRAFINE III SHORT PEN) 31G X 8 MM MISC USE 4 TIMES DAILY  . JARDIANCE 10 MG TABS tablet TAKE 1 TABLET BY MOUTH  DAILY  . lansoprazole (PREVACID) 15 MG capsule Take 15 mg by mouth daily.  Marland Kitchen LANTUS SOLOSTAR 100 UNIT/ML Solostar Pen INJECT SUBCUTANEOUSLY 80  UNITS DAILY AT 10PM  . levothyroxine (SYNTHROID, LEVOTHROID) 50 MCG tablet TAKE 1 TABLET BY MOUTH  DAILY BEFORE BREAKFAST  . metoprolol succinate (TOPROL-XL) 50 MG 24 hr tablet TAKE 1 TABLET BY MOUTH 2  TIMES DAILY WITH OR  IMMEDIATELY FOLLOWING A  MEAL  . nitrofurantoin, macrocrystal-monohydrate, (  MACROBID) 100 MG capsule Take 100 mg by mouth as needed (for urinary tract infection). Take 1 capsule by mouth twice daily for 7 days  . ondansetron (ZOFRAN) 4 MG tablet Take 1 tablet (4 mg total) by mouth every 8 (eight) hours as needed for nausea or vomiting.  Glory Rosebush DELICA LANCETS 46E MISC USE 4 TIMES DAILY  . ONETOUCH VERIO test strip TEST 4 TIMES DAILY  . oxybutynin (DITROPAN-XL) 10 MG 24 hr tablet Take 10 mg daily by mouth.  . telmisartan (MICARDIS) 80 MG tablet Take 1 tablet (80 mg total) by mouth daily.  . clonazePAM (KLONOPIN) 1 MG tablet TAKE 1 TABLET BY MOUTH  NIGHTLY AT BEDTIME FOR  RESTLESS LEG  .  traZODone (DESYREL) 100 MG tablet TAKE 1 TABLET BY MOUTH AT  BEDTIME   No current facility-administered medications for this visit.  (Other)      REVIEW OF SYSTEMS: ROS    Positive for: Endocrine, Eyes   Negative for: Constitutional, Gastrointestinal, Neurological, Skin, Genitourinary, Musculoskeletal, HENT, Cardiovascular, Respiratory, Psychiatric, Allergic/Imm, Heme/Lymph   Last edited by Zenovia Jordan, LPN on 7/0/3500  9:38 AM. (History)       ALLERGIES No Known Allergies  PAST MEDICAL HISTORY Past Medical History:  Diagnosis Date  . Adenomatous colon polyp    hyperplastic  . Anginal pain ([place])    not recent  . Arthritis    NECK, KNEES, FINGERS, TOES  . Atrial tachycardia ([place]) CARDIOLOGIST - DR Caryl Comes (LAST VISIT AUG 2012)   Echo 12/11: EF 55-60%, Mild LVH, grade 1 diast dysfxn;   holter 1/12: ATach  . Atypical chest pain    a. 07/2004 Cath: Clean cors;  b. 04/2010 Myoview: EF 63%, no ischemia  . Blood transfusion without reported diagnosis 1969  . Chronic kidney disease   . Diabetes mellitus, type 2 (HCC)    ORAL AND INSULIN MEDS (LAST A1C  7.3)  . Diverticulosis   . Erythema CURRENT-- CLOSED ABD. WALL ABSCESS   PER PCP NOTE (03-31-11)- MRSA-- TAKES DOXYCYCLINE  . GERD (gastroesophageal reflux disease)    CONTROLLED W/ PREVACID  . History of kidney stones 2012  . Hyperlipidemia   . Hypertension   . Insomnia    TAKES MEDS PRN  . Neuropathy, peripheral   . Obesity (BMI 30-39.9) 02/19/2015  . Pneumonia    as child  . Post op infection 11/07/12   left bunionectomy  . Restless leg syndrome   . Sepsis, urinary HISTORY - 2004  . Sleep apnea   . Vitamin D deficiency    Past Surgical History:  Procedure Laterality Date  . ABDOMINAL HYSTERECTOMY  1995   ovaries remain  . BUNIONECTOMY Left 10/24/12  . BUNIONECTOMY Right 05/17/2017  . CARDIAC CATHETERIZATION  07/10/04  . CARPAL TUNNEL RELEASE  2000   RIGHT  . CERVICAL FUSION  10-12-07   C5 - 7  . COLONOSCOPY     . CYSTOSCOPY WITH RETROGRADE PYELOGRAM, URETEROSCOPY AND STENT PLACEMENT Left 11/28/2012   Procedure: LEFT RETROGRADE PYELOGRAM, LEFT URETEROSCOPY, ;  Surgeon: Claybon Jabs, MD;  Location: [facility];  Service: Urology;  Laterality: Left;  . CYSTOSCOPY/RETROGRADE/URETEROSCOPY/STONE EXTRACTION WITH BASKET  X2 2004 & 2009   LEFT   . GASTRIC BYPASS  1981  . KNEE ARTHROSCOPY  AUG 2012   LEFT  . LAPAROSCOPIC CHOLECYSTECTOMY  2001  . left thumb joint surgery  2013  . PERCUTANEOUS NEPHROSTOLITHOTOMY  02-27-11   LEFT  . POLYPECTOMY    . RIGHT THUMB JOINT  SURG.   MAY 2011  . TRIGGER FINGER RELEASE  2010   RIGHT THUMB  . UPPER GASTROINTESTINAL ENDOSCOPY    . URETERAL STENT PLACEMENT  X2  2004  &  2009   LEFT  . URETEROSCOPY  04/06/2011   Procedure: URETEROSCOPY;  Surgeon: Claybon Jabs, MD;  Location: [facility];  Service: Urology;  Laterality: Left;  L Ureteroscopy Laser Litho & Stent     FAMILY HISTORY Family History  Problem Relation Age of Onset  . Heart attack Father   . Hyperlipidemia Mother   . Hypertension Mother   . Heart disease Sister   . Hypertension Sister   . Colon polyps Sister   . Hypertension Sister   . Colon polyps Sister   . Hypertension Sister   . Hypertension Sister   . Lung cancer Sister 67       stage 4   . Diabetes Unknown   . Breast cancer Unknown   . Heart disease Unknown   . Colon cancer Other 31       nephew  . Esophageal cancer Neg Hx   . Rectal cancer Neg Hx   . Stomach cancer Neg Hx   . Amblyopia Neg Hx   . Blindness Neg Hx   . Cataracts Neg Hx   . Glaucoma Neg Hx   . Retinal detachment Neg Hx   . Strabismus Neg Hx   . Retinitis pigmentosa Neg Hx     SOCIAL HISTORY Social History   Tobacco Use  . Smoking status: Current Every Day Smoker    Packs/day: 0.25    Years: 43.00    Pack years: 10.75    Types: Cigarettes  . Smokeless tobacco: Never Used  . Tobacco comment: 0.75ppd x71yrs, 0.25ppd x57yr (07/12/15). Counseled to  quit -smoking, nicotine replacement and smoking cessation classes discussed-3-4 a day as of 09-06-15  Substance Use Topics  . Alcohol use: No    Alcohol/week: 0.0 standard drinks  . Drug use: No         OPHTHALMIC EXAM:  Base Eye Exam    Visual Acuity (Snellen - Linear)      Right Left   Dist cc 20/30 20/30 -1   Dist ph cc NI 20/30   Correction:  Glasses       Tonometry (Tonopen, 9:50 AM)      Right Left   Pressure 15 15       Pupils      Dark Light Shape React APD   Right 4 3 Round Brisk None   Left 4 3 Round Brisk None       Visual Fields (Counting fingers)      Left Right    Full Full       Extraocular Movement      Right Left    Full, Ortho Full, Ortho       Neuro/Psych    Oriented x3:  Yes   Mood/Affect:  Normal       Dilation    Both eyes:  1.0% Mydriacyl, 2.5% Phenylephrine @ 9:50 AM        Slit Lamp and Fundus Exam    External Exam      Right Left   External Normal Normal       Slit Lamp Exam      Right Left   Lids/Lashes Dermatochalasis - upper lid - superiorly  Dermatochalasis - upper lid - superiorly    Conjunctiva/Sclera White and quiet White and  quiet   Cornea Arcus, 1+ Punctate epithelial erosions 1+ Punctate epithelial erosions, Debris in tear film   Anterior Chamber Deep, clear, Narrow angles Deep, clear, Narrow angles   Iris Round and dilated, No NVI Round and dilated, No NVI   Lens 2+ Cortical cataract, 2-3+ Nuclear sclerosis 2+ Cortical cataract, 2-3+ Nuclear sclerosis   Vitreous Vitreous syneresis Vitreous syneresis       Fundus Exam      Right Left   Disc Normal; no NVD Normal; no NVD   C/D Ratio 0.3 0.3   Macula Flat; Good foveal reflex, Scattered drusen, RPE disruptions; mild RPE mottling, no heme Flat; good foveal reflex; scattered drusen, mild RPE mottling RPE disruptions nasally; no heme   Vessels AV crossing changes; no NVE; spontaneous venous pulsations at the disc, Copper wiring AV crossing changes; no NVE;  spontaneous venous pulsations at the disc, Copper wiring   Periphery Attached; scattered RPE changes/drusen, no heme Attached; no heme; scattered midzonal drusen, mild drusen surrounding disc nasally           IMAGING AND PROCEDURES  Imaging and Procedures for 01/27/17  OCT, Retina - OU - Both Eyes       Right Eye Quality was good. Central Foveal Thickness: 268. Progression has been stable. Findings include normal foveal contour, no IRF, no SRF, pigment epithelial detachment, vitreomacular adhesion  (Rare drusen, VMA).   Left Eye Quality was good. Central Foveal Thickness: 273. Progression has been stable. Findings include normal foveal contour, no IRF, no SRF (Rare drusen).   Notes *Images captured and stored on drive  Diagnosis / Impression: Non-Exudative ARMD OU No DME OU  Clinical management:  See below  Abbreviations: NFP - Normal foveal profile. CME - cystoid macular edema. PED - pigment epithelial detachment. IRF - intraretinal fluid. SRF - subretinal fluid. EZ - ellipsoid zone. ERM - epiretinal membrane. ORA - outer retinal atrophy. ORT - outer retinal tubulation. SRHM - subretinal hyper-reflective material                  ASSESSMENT/PLAN:    ICD-10-CM   1. Diabetes mellitus type 2 without retinopathy ([place]) E11.9 OCT, Retina - OU - Both Eyes  2. Early dry stage nonexudative age-related macular degeneration of both eyes H35.3131   3. Mixed type age-related cataract, both eyes H25.813   4. Retinal edema H35.81 OCT, Retina - OU - Both Eyes    1. Diabetes mellitus type 2 w/o retinopathy OU -- stable  - The incidence, risk factors for progression, natural history and treatment options for diabetic retinopathy were discussed with patient.    - The need for close monitoring of blood glucose, blood pressure, and serum lipids, avoiding cigarette or any type of tobacco, and the need for long term follow up was also discussed with patient.  - being monitored by Dr.  Thom Chimes  - f/u 1-2 years  2. Age related macular degeneration, non-exudative, both eyes  - The incidence, anatomy, and pathology of dry AMD, risk of progression, and the AREDS and AREDS 2 study including smoking risks discussed with patient.  - mild, scattered non central drusen OU -- stable  - no IRF/SRF on OCT  - no heme or evidence of CNVM  - Recommend amsler grid monitoring  3,4. Hypertensive retinopathy OU - discussed importance of tight BP control - monitor  5. [place] + CC, OU  - The symptoms of cataract, surgical options, and treatments and risks were discussed with patient.  - discussed  diagnosis and progression  - approaching visual significance  - monitor for now   Ophthalmic Meds Ordered this visit:  No orders of the defined types were placed in this encounter.      Return in about 2 years (around 01/05/2020) for F/U Non-Exu AMD OU, DFE, OCT.  There are no Patient Instructions on file for this visit.   Explained the diagnoses, plan, and follow up with the patient and they expressed understanding.  Patient expressed understanding of the importance of proper follow up care.   This document serves as a record of services personally performed by Gardiner Sleeper, MD, PhD. It was created on their behalf by Ernest Mallick, OA, an ophthalmic assistant. The creation of this record is the provider's dictation and/or activities during the visit.    Electronically signed by: Ernest Mallick, OA  08.30.2019 12:38 PM   This document serves as a record of services personally performed by Gardiner Sleeper, MD, PhD. It was created on their behalf by Catha Brow, [place], a certified ophthalmic assistant. The creation of this record is the provider's dictation and/or activities during the visit.  Electronically signed by: Catha Brow, [place]  09.03.19 12:38 PM   Gardiner Sleeper, M.D., Ph.D. Vitreoretinal Surgeon [facility]   I have reviewed the above  documentation for accuracy and completeness, and I agree with the above. Gardiner Sleeper, M.D., Ph.D. 01/04/18 12:39 PM    Abbreviations: M myopia (nearsighted); A astigmatism; H hyperopia (farsighted); P presbyopia; Mrx spectacle prescription;  CTL contact lenses; OD right eye; OS left eye; OU both eyes  XT exotropia; ET esotropia; PEK punctate epithelial keratitis; PEE punctate epithelial erosions; DES dry eye syndrome; MGD meibomian gland dysfunction; ATs artificial tears; PFAT's preservative free artificial tears; [place] nuclear sclerotic cataract; PSC posterior subcapsular cataract; ERM epi-retinal membrane; PVD posterior vitreous detachment; RD retinal detachment; DM diabetes mellitus; DR diabetic retinopathy; NPDR non-proliferative diabetic retinopathy; PDR proliferative diabetic retinopathy; CSME clinically significant macular edema; DME diabetic macular edema; dbh dot blot hemorrhages; CWS cotton wool spot; POAG primary open angle glaucoma; C/D cup-to-disc ratio; HVF humphrey visual field; GVF goldmann visual field; OCT optical coherence tomography; IOP intraocular pressure; BRVO Branch retinal vein occlusion; CRVO central retinal vein occlusion; CRAO central retinal artery occlusion; BRAO branch retinal artery occlusion; RT retinal tear; SB scleral buckle; PPV pars plana vitrectomy; VH Vitreous hemorrhage; PRP panretinal laser photocoagulation; IVK intravitreal kenalog; VMT vitreomacular traction; MH Macular hole;  NVD neovascularization of the disc; NVE neovascularization elsewhere; AREDS age related eye disease study; ARMD age related macular degeneration; POAG primary open angle glaucoma; EBMD epithelial/anterior basement membrane dystrophy; ACIOL anterior chamber intraocular lens; IOL intraocular lens; PCIOL posterior chamber intraocular lens; Phaco/IOL phacoemulsification with intraocular lens placement; [place] photorefractive keratectomy; LASIK laser assisted in situ keratomileusis; HTN hypertension;  DM diabetes mellitus; COPD chronic obstructive pulmonary disease

## 2018-01-02 ENCOUNTER — Other Ambulatory Visit: Payer: Self-pay | Admitting: Internal Medicine

## 2018-01-02 ENCOUNTER — Other Ambulatory Visit: Payer: Self-pay | Admitting: Family Medicine

## 2018-01-04 ENCOUNTER — Ambulatory Visit (INDEPENDENT_AMBULATORY_CARE_PROVIDER_SITE_OTHER): Payer: Medicare Other | Admitting: Ophthalmology

## 2018-01-04 ENCOUNTER — Encounter (INDEPENDENT_AMBULATORY_CARE_PROVIDER_SITE_OTHER): Payer: Self-pay | Admitting: Ophthalmology

## 2018-01-04 ENCOUNTER — Ambulatory Visit: Payer: Medicare Other | Admitting: Family Medicine

## 2018-01-04 DIAGNOSIS — H25813 Combined forms of age-related cataract, bilateral: Secondary | ICD-10-CM | POA: Diagnosis not present

## 2018-01-04 DIAGNOSIS — E119 Type 2 diabetes mellitus without complications: Secondary | ICD-10-CM

## 2018-01-04 DIAGNOSIS — H3581 Retinal edema: Secondary | ICD-10-CM

## 2018-01-04 DIAGNOSIS — H353131 Nonexudative age-related macular degeneration, bilateral, early dry stage: Secondary | ICD-10-CM

## 2018-01-04 NOTE — Telephone Encounter (Signed)
Last OV 12/14/17, Next OV 03/08/18  Klonopin 1 mg last filled 10/11/17, # 90 with 0 refills  Trazodone 100 mg last filled 09/01/17, # 90 with 1 refill

## 2018-01-05 ENCOUNTER — Ambulatory Visit: Payer: Medicare Other | Admitting: Family Medicine

## 2018-01-06 ENCOUNTER — Ambulatory Visit: Payer: Medicare Other | Admitting: Physician Assistant

## 2018-01-07 ENCOUNTER — Other Ambulatory Visit: Payer: Self-pay | Admitting: Family Medicine

## 2018-01-31 ENCOUNTER — Telehealth: Payer: Self-pay

## 2018-01-31 NOTE — Telephone Encounter (Signed)
   Primary Cardiologist:Steven Caryl Comes, MD  Chart reviewed as part of pre-operative protocol coverage. Because of Jet Traynham past medical history and time since last visit, he/she will require a follow-up visit in order to better assess preoperative cardiovascular risk.  Will review surgical clearance during office visit 02/17/18 with Dr. Caryl Comes. Will route this recommendations to requesting provider via Epic.   [place], [place]  01/31/2018, 2:32 PM

## 2018-01-31 NOTE — Telephone Encounter (Signed)
   [facility] Pre-operative Risk Assessment    Request for surgical clearance:  1. What type of surgery is being performed? Right Knee scope, chondroplasty, medial menisectomy   2. When is this surgery scheduled? 03/10/18   3. What type of clearance is required (medical clearance vs. Pharmacy clearance to hold med vs. Both)?  Both  4. Are there any medications that need to be held prior to surgery and how long? ASA   5. Practice name and name of physician performing surgery? [facility]   6. What is your office phone number [phone] ex [phone] Ask for Derek Jack   7.   What is your office fax number [phone] Attn Sherri  8.   Anesthesia type (None, local, MAC, general) ? General   Dollene Primrose 01/31/2018, 12:53 PM  _________________________________________________________________   (provider comments below)

## 2018-02-17 ENCOUNTER — Ambulatory Visit: Payer: Medicare Other | Admitting: Internal Medicine

## 2018-02-17 ENCOUNTER — Encounter: Payer: Self-pay | Admitting: Internal Medicine

## 2018-02-17 VITALS — BP 140/70 | HR 74 | Ht 62.0 in | Wt 233.2 lb

## 2018-02-17 DIAGNOSIS — I5030 Unspecified diastolic (congestive) heart failure: Secondary | ICD-10-CM

## 2018-02-17 DIAGNOSIS — I471 Supraventricular tachycardia: Secondary | ICD-10-CM | POA: Diagnosis not present

## 2018-02-17 NOTE — Patient Instructions (Signed)
Medication Instructions:  Your physician recommends that you continue on your current medications as directed. Please refer to the Current Medication list given to you today.  Labwork: None ordered.  Testing/Procedures: None ordered.  Follow-Up: Your physician recommends that you schedule a follow-up appointment in:   1 year with Chanetta Marshall, NP  Any Other Special Instructions Will Be Listed Below (If Applicable).     If you need a refill on your cardiac medications before your next appointment, please call your pharmacy.

## 2018-02-17 NOTE — Progress Notes (Signed)
Patient Care Team: Midge Minium, MD as PCP - General Deboraha Sprang, MD as PCP - Cardiology (Cardiology) Deboraha Sprang, MD as Consulting Physician (Cardiology) Almedia Balls, MD as Consulting Physician (Orthopedic Surgery) Dyke Maes, [place] as Consulting Physician (Optometry) Irene Shipper, MD as Consulting Physician (Gastroenterology) Ceasar Mons, MD as Consulting Physician (Urology) Martinique, Amy, MD as Consulting Physician (Dermatology)   HPI  Lacey [name] is a 67 y.o. female w history of palpitations. They were thought to be caused by atrial tachycardia  Has hypertension; chronic SOB; no snoring.  She is mostly at home. She doesn't exercise.   She has hurt her right meniscus and anticipates arthroscopic surgery next month.  She does her own grocery shopping carries in her own groceries.  Denies chest pain shortness of breath or peripheral edema  She continues to smoke even less than before    She has no known CAD per cath 3/06.   Echocardiogram 04/30/10: EF 55-60%, mild LVH, grade 1 diastolic dysfunction.    Myoview done in 04/30/10: EF 63%, no scar, no ischemia.   Repeat scanning 6/16 was normal   Date Cr K HgbA1c  5/18  0.91 4.6 7.4   8/19 1.0 3.8 7.3    Past Medical History:  Diagnosis Date  . Adenomatous colon polyp    hyperplastic  . Anginal pain ([place])    not recent  . Arthritis    NECK, KNEES, FINGERS, TOES  . Atrial tachycardia ([place]) CARDIOLOGIST - DR Caryl Comes (LAST VISIT AUG 2012)   Echo 12/11: EF 55-60%, Mild LVH, grade 1 diast dysfxn;   holter 1/12: ATach  . Atypical chest pain    a. 07/2004 Cath: Clean cors;  b. 04/2010 Myoview: EF 63%, no ischemia  . Blood transfusion without reported diagnosis 1969  . Chronic kidney disease   . Diabetes mellitus, type 2 (HCC)    ORAL AND INSULIN MEDS (LAST A1C  7.3)  . Diverticulosis   . Erythema CURRENT-- CLOSED ABD. WALL ABSCESS   PER PCP NOTE (03-31-11)- MRSA-- TAKES DOXYCYCLINE    . GERD (gastroesophageal reflux disease)    CONTROLLED W/ PREVACID  . History of kidney stones 2012  . Hyperlipidemia   . Hypertension   . Insomnia    TAKES MEDS PRN  . Neuropathy, peripheral   . Obesity (BMI 30-39.9) 02/19/2015  . Pneumonia    as child  . Post op infection 11/07/12   left bunionectomy  . Restless leg syndrome   . Sepsis, urinary HISTORY - 2004  . Sleep apnea   . Vitamin D deficiency     Past Surgical History:  Procedure Laterality Date  . ABDOMINAL HYSTERECTOMY  1995   ovaries remain  . BUNIONECTOMY Left 10/24/12  . BUNIONECTOMY Right 05/17/2017  . CARDIAC CATHETERIZATION  07/10/04  . CARPAL TUNNEL RELEASE  2000   RIGHT  . CERVICAL FUSION  10-12-07   C5 - 7  . COLONOSCOPY    . CYSTOSCOPY WITH RETROGRADE PYELOGRAM, URETEROSCOPY AND STENT PLACEMENT Left 11/28/2012   Procedure: LEFT RETROGRADE PYELOGRAM, LEFT URETEROSCOPY, ;  Surgeon: Claybon Jabs, MD;  Location: [facility];  Service: Urology;  Laterality: Left;  . CYSTOSCOPY/RETROGRADE/URETEROSCOPY/STONE EXTRACTION WITH BASKET  X2 2004 & 2009   LEFT   . GASTRIC BYPASS  1981  . KNEE ARTHROSCOPY  AUG 2012   LEFT  . LAPAROSCOPIC CHOLECYSTECTOMY  2001  . left thumb joint surgery  2013  . PERCUTANEOUS NEPHROSTOLITHOTOMY  02-27-11  LEFT  . POLYPECTOMY    . RIGHT THUMB JOINT SURG.   MAY 2011  . TRIGGER FINGER RELEASE  2010   RIGHT THUMB  . UPPER GASTROINTESTINAL ENDOSCOPY    . URETERAL STENT PLACEMENT  X2  2004  &  2009   LEFT  . URETEROSCOPY  04/06/2011   Procedure: URETEROSCOPY;  Surgeon: Claybon Jabs, MD;  Location: [facility];  Service: Urology;  Laterality: Left;  L Ureteroscopy Laser Litho & Stent     Current Outpatient Medications  Medication Sig Dispense Refill  . aspirin EC 81 MG tablet Take 81 mg by mouth daily.    Marland Kitchen atorvastatin (LIPITOR) 20 MG tablet TAKE 1 TABLET BY MOUTH  DAILY 90 tablet 1  . celecoxib (CELEBREX) 200 MG capsule Take 200 mg by mouth every morning.    .  citalopram (CELEXA) 20 MG tablet Take 20 mg by mouth daily.    . clonazePAM (KLONOPIN) 1 MG tablet TAKE 1 TABLET BY MOUTH  NIGHTLY AT BEDTIME FOR  RESTLESS LEG 90 tablet 1  . clotrimazole-betamethasone (LOTRISONE) cream Apply 1 application topically 2 (two) times daily. 45 g 0  . diphenoxylate-atropine (LOMOTIL) 2.5-0.025 MG per tablet Take 1 tablet by mouth 4 (four) times daily as needed for diarrhea or loose stools. 30 tablet 0  . fenofibrate 160 MG tablet TAKE 1 TABLET BY MOUTH  DAILY 90 tablet 1  . furosemide (LASIX) 20 MG tablet Take 1 tablet (20 mg total) by mouth daily. 90 tablet 3  . gabapentin (NEURONTIN) 300 MG capsule TAKE 1 CAPSULE BY MOUTH TWO TIMES DAILY 180 capsule 1  . HUMALOG KWIKPEN 100 UNIT/ML KiwkPen INJECT 35 UNITS  SUBCUTANEOUSLY 3 TIMES  DAILY AS DIRECTED 105 mL 1  . hydrochlorothiazide (HYDRODIURIL) 12.5 MG tablet Take 1 tablet (12.5 mg total) by mouth daily. 30 tablet 3  . HYDROcodone-acetaminophen (NORCO/VICODIN) 5-325 MG tablet Take 1 tablet by mouth every 4 (four) hours as needed. 16 tablet 0  . hydrocortisone valerate cream (WESTCORT) 0.2 % Apply 1 application topically as needed (for skin irritation).    . Insulin Pen Needle (B-D ULTRAFINE III SHORT PEN) 31G X 8 MM MISC USE 4 TIMES DAILY 360 each 1  . JARDIANCE 10 MG TABS tablet TAKE 1 TABLET BY MOUTH  DAILY 90 tablet 1  . lansoprazole (PREVACID) 15 MG capsule Take 15 mg by mouth daily.    Marland Kitchen LANTUS SOLOSTAR 100 UNIT/ML Solostar Pen INJECT SUBCUTANEOUSLY 80  UNITS DAILY AT 10PM 75 mL 1  . levothyroxine (SYNTHROID, LEVOTHROID) 50 MCG tablet TAKE 1 TABLET BY MOUTH  DAILY BEFORE BREAKFAST 90 tablet 1  . metoprolol succinate (TOPROL-XL) 50 MG 24 hr tablet TAKE 1 TABLET BY MOUTH 2  TIMES DAILY WITH OR  IMMEDIATELY FOLLOWING A  MEAL 60 tablet 0  . nitrofurantoin, macrocrystal-monohydrate, (MACROBID) 100 MG capsule Take 100 mg by mouth as needed (for urinary tract infection). Take 1 capsule by mouth twice daily for 7 days    .  ondansetron (ZOFRAN) 4 MG tablet Take 1 tablet (4 mg total) by mouth every 8 (eight) hours as needed for nausea or vomiting. 12 tablet 0  . ONETOUCH DELICA LANCETS 36R MISC USE 4 TIMES DAILY 400 each 1  . ONETOUCH VERIO test strip TEST 4 TIMES DAILY 400 each 6  . oxybutynin (DITROPAN-XL) 10 MG 24 hr tablet Take 10 mg daily by mouth.  11  . telmisartan (MICARDIS) 80 MG tablet Take 1 tablet (80 mg total) by  mouth daily. 90 tablet 1  . traZODone (DESYREL) 100 MG tablet TAKE 1 TABLET BY MOUTH AT  BEDTIME 90 tablet 1   No current facility-administered medications for this visit.     No Known Allergies  Review of Systems negative except from HPI and PMH  Physical Exam BP 140/70   Pulse 74   Ht 5\' 2"  (1.575 m)   Wt 233 lb 3.2 oz (105.8 kg)   LMP 06/06/1993   SpO2 93%   BMI 42.65 kg/m  Well developed and nourished in no acute distress HENT normal Neck supple with JVP-flat Clear Regular rate and rhythm, no murmurs or gallops Abd-soft with active BS No Clubbing cyanosis edema Skin-warm and dry A & Oriented  Grossly normal sensory and motor function    ECG demonstrates sinus rhythm at 74 Intervals 15/09/38 Nonspecific ST-T changes  Assessment and  Plan   Atrial tachycardia     HFpEF  Hypertension  Morbidly obese   No significant interval arrhythmias  Euvolemic continue current meds  BP reasonably controlled  She should be acceptable cardiovascular risk for heart knee surgery.  We are available to help as needed

## 2018-02-21 ENCOUNTER — Other Ambulatory Visit: Payer: Self-pay

## 2018-02-21 ENCOUNTER — Encounter: Payer: Self-pay | Admitting: Family Medicine

## 2018-02-21 ENCOUNTER — Ambulatory Visit: Payer: Medicare Other | Admitting: Family Medicine

## 2018-02-21 VITALS — BP 128/70 | HR 65 | Temp 98.0°F | Resp 16 | Ht 62.0 in | Wt 233.1 lb

## 2018-02-21 DIAGNOSIS — E113391 Type 2 diabetes mellitus with moderate nonproliferative diabetic retinopathy without macular edema, right eye: Secondary | ICD-10-CM | POA: Diagnosis not present

## 2018-02-21 DIAGNOSIS — Z794 Long term (current) use of insulin: Secondary | ICD-10-CM

## 2018-02-21 LAB — BASIC METABOLIC PANEL
BUN: 21 mg/dL (ref 6–23)
CALCIUM: 9.2 mg/dL (ref 8.4–10.5)
CHLORIDE: 103 meq/L (ref 96–112)
CO2: 28 mEq/L (ref 19–32)
CREATININE: 0.99 mg/dL (ref 0.40–1.20)
GFR: 59.32 mL/min — AB (ref >60.00)
Glucose, Bld: 121 mg/dL — ABNORMAL HIGH (ref 70–99)
Potassium: 4.3 mEq/L (ref 3.5–5.1)
Sodium: 138 mEq/L (ref 135–145)

## 2018-02-21 LAB — HEMOGLOBIN A1C: HEMOGLOBIN A1C: 6.3 % (ref 4.6–6.5)

## 2018-02-21 MED ORDER — INSULIN LISPRO 100 UNIT/ML (KWIKPEN): PEN_INJECTOR | SUBCUTANEOUS | 1 refills | Status: DC

## 2018-02-21 NOTE — Patient Instructions (Addendum)
Follow up in 4 months to recheck diabetes We'll notify you of your lab results and make any changes if needed Continue to work on healthy diet DECREASE Lantus to 75 units nightly Call with any questions or concerns Hang in there!!!

## 2018-02-21 NOTE — Progress Notes (Signed)
   Subjective:    Patient ID: Lacey [name], female    DOB: 1950/09/18, 67 y.o.   MRN: [id number]  HPI DM- chronic problem, on Humalog, Jardiance 10mg , Lantus 80mg  QHS w/ hx of adequate control.  On ARB.  UD on eye exam, foot exam.  'I don't ever feel good.  Aches and pains'.  Pt reports CBGs are 'really good'.  Having some low sugars- 48, 42.  Pt needs A1C and BMP for upcoming knee surgery.  Denies CP, SOB, HAs, visual chanes, edema.  No abd pain, N/V.   Review of Systems For ROS see HPI     Objective:   Physical Exam  Constitutional: She is oriented to person, place, and time. She appears well-developed and well-nourished. No distress.  obese  HENT:  Head: Normocephalic and atraumatic.  Eyes: Pupils are equal, round, and reactive to light. Conjunctivae and EOM are normal.  Neck: Normal range of motion. Neck supple. No thyromegaly present.  Cardiovascular: Normal rate, regular rhythm, normal heart sounds and intact distal pulses.  No murmur heard. Pulmonary/Chest: Effort normal and breath sounds normal. No respiratory distress.  Abdominal: Soft. She exhibits no distension. There is no tenderness.  Musculoskeletal: She exhibits no edema.  Lymphadenopathy:    She has no cervical adenopathy.  Neurological: She is alert and oriented to person, place, and time.  Skin: Skin is warm and dry.  Psychiatric: She has a normal mood and affect. Her behavior is normal.  Vitals reviewed.         Assessment & Plan:

## 2018-02-21 NOTE — Assessment & Plan Note (Signed)
Chronic problem.  Pt is now having symptomatic lows- particularly overnight.  Decrease Lantus to 75 units.  Continue Humalog at 35 units TID.  UTD on foot exam, eye exam, and on ARB for renal protection.  Pt needs updated A1C prior to knee surgery in a few weeks.  Check labs.  Adjust meds prn

## 2018-03-07 ENCOUNTER — Encounter: Payer: Self-pay | Admitting: Family Medicine

## 2018-03-08 ENCOUNTER — Ambulatory Visit: Payer: Medicare Other | Admitting: Family Medicine

## 2018-03-08 MED ORDER — VALSARTAN 320 MG PO TABS: 320.0000 mg | ORAL_TABLET | Freq: Every day | ORAL | 1 refills | Status: DC

## 2018-03-08 MED ORDER — VALSARTAN 320 MG PO TABS: 320.0000 mg | ORAL_TABLET | Freq: Every day | ORAL | 0 refills | Status: DC

## 2018-03-21 ENCOUNTER — Other Ambulatory Visit: Payer: Self-pay | Admitting: Emergency Medicine

## 2018-03-21 ENCOUNTER — Other Ambulatory Visit: Payer: Self-pay | Admitting: Internal Medicine

## 2018-03-21 MED ORDER — VALSARTAN 320 MG PO TABS: 320.0000 mg | ORAL_TABLET | Freq: Every day | ORAL | 1 refills | Status: DC

## 2018-03-24 ENCOUNTER — Other Ambulatory Visit: Payer: Self-pay | Admitting: Family Medicine

## 2018-04-01 ENCOUNTER — Other Ambulatory Visit: Payer: Self-pay | Admitting: Family Medicine

## 2018-04-28 ENCOUNTER — Other Ambulatory Visit: Payer: Self-pay | Admitting: Family Medicine

## 2018-05-14 ENCOUNTER — Other Ambulatory Visit: Payer: Self-pay | Admitting: Family Medicine

## 2018-05-27 ENCOUNTER — Other Ambulatory Visit: Payer: Self-pay | Admitting: Family Medicine

## 2018-05-31 ENCOUNTER — Other Ambulatory Visit: Payer: Self-pay | Admitting: Family Medicine

## 2018-06-15 NOTE — Progress Notes (Addendum)
Subjective:   Lacey [name] is a 68 y.o. female who presents for Medicare Annual (Subsequent) preventive examination.  Review of Systems:  No ROS.  Medicare Wellness Visit. Additional risk factors are reflected in the social history.    Sleep patterns: Sleeps 3-4 hours.  Home Safety/Smoke Alarms: Feels safe in home. Smoke alarms in place.  Living environment; residence and Firearm Safety: Lives with husband in 1 story home.  Seat Belt Safety/Bike Helmet: Wears seat belt.   Female:   Pap-N/A      Mammo-12/20/2017, benign.      Dexa scan-06/14/2017, normal.        CCS-Colonoscopy 09/20/2015, polyp. Recall 5 years.      Objective:     Vitals: LMP 06/06/1993   There is no height or weight on file to calculate BMI.  Advanced Directives 06/09/2017 07/08/2016 09/06/2015 10/19/2014 11/25/2012 04/06/2011 04/02/2011  Does Patient Have a Medical Advance Directive? Yes No No No Patient does not have advance directive;Patient would not like information Patient does not have advance directive (No Data)  Type of Advance Directive Living will;Healthcare Power of Attorney - - - - - -  [place] in Chart? No - copy requested - - - - - -  Would patient like information on creating a medical advance directive? - No - Patient declined - No - patient declined information - - -  Pre-existing out of facility DNR order (yellow form or pink MOST form) - - - - No No -    Tobacco Social History   Tobacco Use  Smoking Status Current Every Day Smoker  . Packs/day: 0.25  . Years: 43.00  . Pack years: 10.75  . Types: Cigarettes  Smokeless Tobacco Never Used  Tobacco Comment   0.75ppd x39yrs, 0.25ppd x100yr (07/12/15). Counseled to quit -smoking, nicotine replacement and smoking cessation classes discussed-3-4 a day as of 09-06-15     Ready to quit: Not Answered Counseling given: Not Answered Comment: 0.75ppd x57yrs, 0.25ppd x31yr (07/12/15). Counseled to quit -smoking, nicotine  replacement and smoking cessation classes discussed-3-4 a day as of 09-06-15   Past Medical History:  Diagnosis Date  . Adenomatous colon polyp    hyperplastic  . Anginal pain ([place])    not recent  . Arthritis    NECK, KNEES, FINGERS, TOES  . Atrial tachycardia ([place]) CARDIOLOGIST - DR Caryl Comes (LAST VISIT AUG 2012)   Echo 12/11: EF 55-60%, Mild LVH, grade 1 diast dysfxn;   holter 1/12: ATach  . Atypical chest pain    a. 07/2004 Cath: Clean cors;  b. 04/2010 Myoview: EF 63%, no ischemia  . Blood transfusion without reported diagnosis 1969  . Chronic kidney disease   . Diabetes mellitus, type 2 (HCC)    ORAL AND INSULIN MEDS (LAST A1C  7.3)  . Diverticulosis   . Erythema CURRENT-- CLOSED ABD. WALL ABSCESS   PER PCP NOTE (03-31-11)- MRSA-- TAKES DOXYCYCLINE  . GERD (gastroesophageal reflux disease)    CONTROLLED W/ PREVACID  . History of kidney stones 2012  . Hyperlipidemia   . Hypertension   . Insomnia    TAKES MEDS PRN  . Neuropathy, peripheral   . Obesity (BMI 30-39.9) 02/19/2015  . Pneumonia    as child  . Post op infection 11/07/12   left bunionectomy  . Restless leg syndrome   . Sepsis, urinary HISTORY - 2004  . Sleep apnea   . Vitamin D deficiency    Past Surgical History:  Procedure Laterality  Date  . ABDOMINAL HYSTERECTOMY  1995   ovaries remain  . BUNIONECTOMY Left 10/24/12  . BUNIONECTOMY Right 05/17/2017  . CARDIAC CATHETERIZATION  07/10/04  . CARPAL TUNNEL RELEASE  2000   RIGHT  . CERVICAL FUSION  10-12-07   C5 - 7  . COLONOSCOPY    . CYSTOSCOPY WITH RETROGRADE PYELOGRAM, URETEROSCOPY AND STENT PLACEMENT Left 11/28/2012   Procedure: LEFT RETROGRADE PYELOGRAM, LEFT URETEROSCOPY, ;  Surgeon: Claybon Jabs, MD;  Location: [facility];  Service: Urology;  Laterality: Left;  . CYSTOSCOPY/RETROGRADE/URETEROSCOPY/STONE EXTRACTION WITH BASKET  X2 2004 & 2009   LEFT   . GASTRIC BYPASS  1981  . KNEE ARTHROSCOPY  AUG 2012   LEFT  . LAPAROSCOPIC CHOLECYSTECTOMY  2001  . left  thumb joint surgery  2013  . PERCUTANEOUS NEPHROSTOLITHOTOMY  02-27-11   LEFT  . POLYPECTOMY    . RIGHT THUMB JOINT SURG.   MAY 2011  . TRIGGER FINGER RELEASE  2010   RIGHT THUMB  . UPPER GASTROINTESTINAL ENDOSCOPY    . URETERAL STENT PLACEMENT  X2  2004  &  2009   LEFT  . URETEROSCOPY  04/06/2011   Procedure: URETEROSCOPY;  Surgeon: Claybon Jabs, MD;  Location: [facility];  Service: Urology;  Laterality: Left;  L Ureteroscopy Laser Litho & Stent    Family History  Problem Relation Age of Onset  . Heart attack Father   . Hyperlipidemia Mother   . Hypertension Mother   . Heart disease Sister   . Hypertension Sister   . Colon polyps Sister   . Hypertension Sister   . Colon polyps Sister   . Hypertension Sister   . Hypertension Sister   . Lung cancer Sister 24       stage 4   . Diabetes Unknown   . Breast cancer Unknown   . Heart disease Unknown   . Colon cancer Other 69       nephew  . Esophageal cancer Neg Hx   . Rectal cancer Neg Hx   . Stomach cancer Neg Hx   . Amblyopia Neg Hx   . Blindness Neg Hx   . Cataracts Neg Hx   . Glaucoma Neg Hx   . Retinal detachment Neg Hx   . Strabismus Neg Hx   . Retinitis pigmentosa Neg Hx    Social History   Socioeconomic History  . Marital status: Married    Spouse name: Not on file  . Number of children: 2  . Years of education: Not on file  . Highest education level: Not on file  Occupational History  . Occupation: Engineer, materials middle school    Employer: [employer]    Comment: in office  Social Needs  . Financial resource strain: Not on file  . Food insecurity:    Worry: Not on file    Inability: Not on file  . Transportation needs:    Medical: Not on file    Non-medical: Not on file  Tobacco Use  . Smoking status: Current Every Day Smoker    Packs/day: 0.25    Years: 43.00    Pack years: 10.75    Types: Cigarettes  . Smokeless tobacco: Never Used  . Tobacco comment: 0.75ppd x39yrs, 0.25ppd  x24yr (07/12/15). Counseled to quit -smoking, nicotine replacement and smoking cessation classes discussed-3-4 a day as of 09-06-15  Substance and Sexual Activity  . Alcohol use: No    Alcohol/week: 0.0 standard drinks  . Drug use:  No  . Sexual activity: Not on file  Lifestyle  . Physical activity:    Days per week: Not on file    Minutes per session: Not on file  . Stress: Not on file  Relationships  . Social connections:    Talks on phone: Not on file    Gets together: Not on file    Attends religious service: Not on file    Active member of club or organization: Not on file    Attends meetings of clubs or organizations: Not on file    Relationship status: Not on file  Other Topics Concern  . Not on file  Social History Narrative   2 children, 2 stepchildren    Outpatient Encounter Medications as of 06/16/2018  Medication Sig  . aspirin EC 81 MG tablet Take 81 mg by mouth daily.  Marland Kitchen atorvastatin (LIPITOR) 20 MG tablet TAKE 1 TABLET BY MOUTH  DAILY  . celecoxib (CELEBREX) 200 MG capsule Take 200 mg by mouth every morning.  . citalopram (CELEXA) 20 MG tablet Take 20 mg by mouth daily.  . clonazePAM (KLONOPIN) 1 MG tablet TAKE 1 TABLET BY MOUTH  NIGHTLY AT BEDTIME FOR  RESTLESS LEG  . clotrimazole-betamethasone (LOTRISONE) cream Apply 1 application topically 2 (two) times daily.  . diphenoxylate-atropine (LOMOTIL) 2.5-0.025 MG per tablet Take 1 tablet by mouth 4 (four) times daily as needed for diarrhea or loose stools.  . fenofibrate 160 MG tablet TAKE 1 TABLET BY MOUTH  DAILY  . furosemide (LASIX) 20 MG tablet Take 1 tablet (20 mg total) by mouth daily.  Marland Kitchen gabapentin (NEURONTIN) 300 MG capsule TAKE 1 CAPSULE BY MOUTH TWO TIMES DAILY  . hydrochlorothiazide (HYDRODIURIL) 12.5 MG tablet Take 1 tablet (12.5 mg total) by mouth daily.  Marland Kitchen HYDROcodone-acetaminophen (NORCO/VICODIN) 5-325 MG tablet Take 1 tablet by mouth every 4 (four) hours as needed.  . hydrocortisone valerate cream  (WESTCORT) 0.2 % Apply 1 application topically as needed (for skin irritation).  . insulin lispro (HUMALOG KWIKPEN) 100 UNIT/ML KiwkPen INJECT 35 UNITS  SUBCUTANEOUSLY 3 TIMES  DAILY AS DIRECTED  . Insulin Pen Needle (B-D ULTRAFINE III SHORT PEN) 31G X 8 MM MISC USE 4 TIMES DAILY  . JARDIANCE 10 MG TABS tablet TAKE 1 TABLET BY MOUTH  DAILY  . Lancets (ONETOUCH DELICA PLUS [id number]) MISC USE 4 TIMES DAILY  . lansoprazole (PREVACID) 15 MG capsule Take 15 mg by mouth daily.  Marland Kitchen LANTUS SOLOSTAR 100 UNIT/ML Solostar Pen INJECT SUBCUTANEOUSLY 80  UNITS DAILY AT 10PM  . levothyroxine (SYNTHROID, LEVOTHROID) 50 MCG tablet TAKE 1 TABLET BY MOUTH  DAILY BEFORE BREAKFAST  . metoprolol succinate (TOPROL-XL) 50 MG 24 hr tablet TAKE 1 TABLET BY MOUTH 2  TIMES DAILY WITH OR  IMMEDIATELY FOLLOWING A  MEAL  . nitrofurantoin, macrocrystal-monohydrate, (MACROBID) 100 MG capsule Take 100 mg by mouth as needed (for urinary tract infection). Take 1 capsule by mouth twice daily for 7 days  . ondansetron (ZOFRAN) 4 MG tablet Take 1 tablet (4 mg total) by mouth every 8 (eight) hours as needed for nausea or vomiting.  Glory Rosebush VERIO test strip TEST 4 TIMES DAILY  . oxybutynin (DITROPAN-XL) 10 MG 24 hr tablet Take 10 mg daily by mouth.  . traZODone (DESYREL) 100 MG tablet TAKE 1 TABLET BY MOUTH AT  BEDTIME  . valsartan (DIOVAN) 320 MG tablet Take 1 tablet (320 mg total) by mouth daily.   No facility-administered encounter medications on file as of 06/16/2018.  Activities of Daily Living In your present state of health, do you have any difficulty performing the following activities: 02/21/2018 12/14/2017  Hearing? N N  Vision? N N  Difficulty concentrating or making decisions? N N  Walking or climbing stairs? N N  Dressing or bathing? N N  Doing errands, shopping? N N  Some recent data might be hidden    Patient Care Team: Midge Minium, MD as PCP - General Deboraha Sprang, MD as PCP - Cardiology  (Cardiology) Deboraha Sprang, MD as Consulting Physician (Cardiology) Almedia Balls, MD as Consulting Physician (Orthopedic Surgery) Dyke Maes, [place] as Consulting Physician (Optometry) Irene Shipper, MD as Consulting Physician (Gastroenterology) Ceasar Mons, MD as Consulting Physician (Urology) Martinique, Amy, MD as Consulting Physician (Dermatology)    Assessment:   This is a routine wellness examination for S[place]i.  Exercise Activities and Dietary recommendations   Diet (meal preparation, eat out, water intake, caffeinated beverages, dairy products, fruits and vegetables): Drinks water, diet soda, sugar free juice.   Breakfast: cereal; 2 eggs/toast; pancake Lunch: occasionally skips; sandwich Dinner: protein and veggies  Goals    . Weight (lb) < 200 lb (90.7 kg)     Lose weight by being as active as possible and watching carb intake.        Fall Risk Fall Risk  02/21/2018 12/14/2017 09/01/2017 06/09/2017 03/16/2017  Falls in the past year? No No No No No    Depression Screen PHQ 2/9 Scores 02/21/2018 12/14/2017 09/01/2017 06/09/2017  PHQ - 2 Score 0 0 0 0  PHQ- 9 Score 0 0 0 -  Exception Documentation - - - -     Cognitive Function MMSE - Mini Mental State Exam 06/09/2017  Orientation to time 5  Orientation to Place 5  Registration 3  Attention/ Calculation 5  Recall 3  Language- name 2 objects 2  Language- repeat 1  Language- follow 3 step command 3  Language- read & follow direction 1  Write a sentence 1  Copy design 1  Total score 30        Immunization History  Administered Date(s) Administered  . Influenza Split 02/05/2011, 02/26/2012  . Influenza Whole 02/05/2009, 01/20/2010  . Influenza, High Dose Seasonal PF 01/10/2018  . Influenza,inj,Quad PF,6+ Mos 01/24/2013, 01/15/2014, 01/22/2015, 01/23/2016, 12/29/2016  . Pneumococcal Conjugate-13 04/17/2014  . Pneumococcal Polysaccharide-23 06/05/2007, 02/06/2016  . Tdap 06/22/2011  . Zoster 03/24/2012   . Zoster Recombinat (Shingrix) 07/01/2017, 09/04/2017     Screening Tests Health Maintenance  Topic Date Due  . HEMOGLOBIN A1C  08/23/2018  . FOOT EXAM  09/02/2018  . OPHTHALMOLOGY EXAM  12/17/2018  . MAMMOGRAM  12/21/2018  . DEXA SCAN  06/15/2019  . COLONOSCOPY  09/19/2020  . TETANUS/TDAP  06/21/2021  . INFLUENZA VACCINE  Completed  . Hepatitis C Screening  Completed  . PNA vac Low Risk Adult  Completed       Plan:    Bring a copy of your living will and/or healthcare power of attorney to your next office visit.  Continue doing brain stimulating activities (puzzles, reading, adult coloring books, staying active) to keep memory sharp.   I have personally reviewed and noted the following in the patient's chart:   . Medical and social history . Use of alcohol, tobacco or illicit drugs  . Current medications and supplements . Functional ability and status . Nutritional status . Physical activity . Advanced directives . List of other physicians . Hospitalizations, surgeries, and ER  visits in previous 12 months . Vitals . Screenings to include cognitive, depression, and falls . Referrals and appointments  In addition, I have reviewed and discussed with patient certain preventive protocols, quality metrics, and best practice recommendations. A written personalized care plan for preventive services as well as general preventive health recommendations were provided to patient.     Gerilyn Nestle, RN  06/15/2018  Reviewed documentation provided by RN and agree w/ above.  Annye Asa, MD

## 2018-06-16 ENCOUNTER — Ambulatory Visit: Payer: Medicare Other | Admitting: Family Medicine

## 2018-06-16 ENCOUNTER — Encounter: Payer: Self-pay | Admitting: Family Medicine

## 2018-06-16 ENCOUNTER — Ambulatory Visit (INDEPENDENT_AMBULATORY_CARE_PROVIDER_SITE_OTHER): Payer: Medicare Other

## 2018-06-16 ENCOUNTER — Other Ambulatory Visit: Payer: Self-pay

## 2018-06-16 VITALS — BP 132/80 | HR 66 | Temp 97.6°F | Resp 16 | Ht 62.0 in | Wt 233.0 lb

## 2018-06-16 DIAGNOSIS — E1169 Type 2 diabetes mellitus with other specified complication: Secondary | ICD-10-CM

## 2018-06-16 DIAGNOSIS — I110 Hypertensive heart disease with heart failure: Secondary | ICD-10-CM

## 2018-06-16 DIAGNOSIS — Z0000 Encounter for general adult medical examination without abnormal findings: Secondary | ICD-10-CM | POA: Diagnosis not present

## 2018-06-16 DIAGNOSIS — E785 Hyperlipidemia, unspecified: Secondary | ICD-10-CM | POA: Diagnosis not present

## 2018-06-16 DIAGNOSIS — E113391 Type 2 diabetes mellitus with moderate nonproliferative diabetic retinopathy without macular edema, right eye: Secondary | ICD-10-CM | POA: Diagnosis not present

## 2018-06-16 DIAGNOSIS — E669 Obesity, unspecified: Secondary | ICD-10-CM

## 2018-06-16 DIAGNOSIS — Z794 Long term (current) use of insulin: Secondary | ICD-10-CM

## 2018-06-16 LAB — LIPID PANEL
CHOL/HDL RATIO: 3
CHOLESTEROL: 109 mg/dL (ref 0–200)
HDL: 42.2 mg/dL (ref >39.00)
LDL CALC: 49 mg/dL (ref 0–99)
NonHDL: 67.24
TRIGLYCERIDES: 92 mg/dL (ref 0.0–149.0)
VLDL: 18.4 mg/dL (ref 0.0–40.0)

## 2018-06-16 LAB — BASIC METABOLIC PANEL
BUN: 13 mg/dL (ref 6–23)
CALCIUM: 9.1 mg/dL (ref 8.4–10.5)
CO2: 30 meq/L (ref 19–32)
Chloride: 105 mEq/L (ref 96–112)
Creatinine, Ser: 0.87 mg/dL (ref 0.40–1.20)
GFR: 64.73 mL/min (ref >60.00)
GLUCOSE: 106 mg/dL — AB (ref 70–99)
POTASSIUM: 4.6 meq/L (ref 3.5–5.1)
Sodium: 142 mEq/L (ref 135–145)

## 2018-06-16 LAB — CBC WITH DIFFERENTIAL/PLATELET
Basophils Absolute: 0.1 10*3/uL (ref 0.0–0.1)
Basophils Relative: 0.7 % (ref 0.0–3.0)
Eosinophils Absolute: 0.2 10*3/uL (ref 0.0–0.7)
Eosinophils Relative: 2.3 % (ref 0.0–5.0)
HCT: 44.5 % (ref 36.0–46.0)
Hemoglobin: 14.6 g/dL (ref 12.0–15.0)
LYMPHS ABS: 2.1 10*3/uL (ref 0.7–4.0)
Lymphocytes Relative: 25.5 % (ref 12.0–46.0)
MCHC: 32.9 g/dL (ref 30.0–36.0)
MCV: 93.9 fl (ref 78.0–100.0)
Monocytes Absolute: 0.6 10*3/uL (ref 0.1–1.0)
Monocytes Relative: 7.1 % (ref 3.0–12.0)
NEUTROS PCT: 64.4 % (ref 43.0–77.0)
Neutro Abs: 5.3 10*3/uL (ref 1.4–7.7)
Platelets: 188 10*3/uL (ref 150.0–400.0)
RBC: 4.73 Mil/uL (ref 3.87–5.11)
RDW: 14.3 % (ref 11.5–15.5)
WBC: 8.2 10*3/uL (ref 4.0–10.5)

## 2018-06-16 LAB — HEPATIC FUNCTION PANEL
ALT: 13 U/L (ref 0–35)
AST: 15 U/L (ref 0–37)
Albumin: 4.2 g/dL (ref 3.5–5.2)
Alkaline Phosphatase: 54 U/L (ref 39–117)
Bilirubin, Direct: 0.1 mg/dL (ref 0.0–0.3)
TOTAL PROTEIN: 6.4 g/dL (ref 6.0–8.3)
Total Bilirubin: 0.5 mg/dL (ref 0.2–1.2)

## 2018-06-16 LAB — TSH: TSH: 1.73 u[IU]/mL (ref 0.35–4.50)

## 2018-06-16 LAB — HEMOGLOBIN A1C: Hgb A1c MFr Bld: 6.2 % (ref 4.6–6.5)

## 2018-06-16 NOTE — Assessment & Plan Note (Signed)
Chronic problem.  Pt has excellent control recently.  We have had to decrease insulin due to symptomatic lows.  UTD on eye exam, foot exam, and on ARB for renal protection.  Check labs.  Adjust meds prn

## 2018-06-16 NOTE — Patient Instructions (Addendum)

## 2018-06-16 NOTE — Assessment & Plan Note (Signed)
Chronic problem.  Well controlled.  Asymptomatic.  Check labs.  No anticipated med changes.  Will follow. 

## 2018-06-16 NOTE — Assessment & Plan Note (Signed)
Ongoing issue for pt.  BMI remains 42.62.  stressed need for healthy diet and regular exercise.  Will continue to follow.

## 2018-06-16 NOTE — Progress Notes (Signed)
   Subjective:    Patient ID: Lacey [name], female    DOB: [birth year], 68 y.o.   MRN: [id number]  HPI HTN- chronic problem, on Valsartan 320mg  daily, Metoprolol 50mg  BID, HCTZ 12.5mg  daily, and Lasix 20mg  daily w/ good control.  No CP, SOB above baseline, HAs, visual changes, edema.  DM- chronic problem, on Humalog 35 units TID, Jardiance 10mg  daily w/ hx of good control.  UTD on foot exam, eye exam.  On ARB for renal protection.  Got a steroid shot in knee 2 weeks ago.  Having some symptomatic lows.  Pt reports sugars are 'doing real good'.  Hyperlipidemia- chronic problem, on Lipitor 20mg  and fenofibrate 160mg  daily.  No abd pain, N/V.  Obesity- weight is stable.  BMI is 42.62  Unable to exercise much due to chronic joint pain.  Is trying to walk.   Review of Systems For ROS see HPI     Objective:   Physical Exam Vitals signs reviewed.  Constitutional:      General: She is not in acute distress.    Appearance: She is well-developed. She is obese.  HENT:     Head: Normocephalic and atraumatic.  Eyes:     Conjunctiva/sclera: Conjunctivae normal.     Pupils: Pupils are equal, round, and reactive to light.  Neck:     Musculoskeletal: Normal range of motion and neck supple.     Thyroid: No thyromegaly.  Cardiovascular:     Rate and Rhythm: Normal rate and regular rhythm.     Heart sounds: Normal heart sounds. No murmur.  Pulmonary:     Effort: Pulmonary effort is normal. No respiratory distress.     Breath sounds: Normal breath sounds.  Abdominal:     General: There is no distension.     Palpations: Abdomen is soft.     Tenderness: There is no abdominal tenderness.  Lymphadenopathy:     Cervical: No cervical adenopathy.  Skin:    General: Skin is warm and dry.  Neurological:     Mental Status: She is alert and oriented to person, place, and time.  Psychiatric:        Behavior: Behavior normal.           Assessment & Plan:

## 2018-06-16 NOTE — Patient Instructions (Signed)
Schedule your complete physical in 4 months We'll notify you of your lab results and make any changes if needed Continue to work on healthy diet and regular exercise- you can do it! Call with any questions or concerns Happy Valentine's Day!!!

## 2018-06-16 NOTE — Assessment & Plan Note (Signed)
Chronic problem.  Tolerating statin w/o difficulty.  Check labs.  Adjust meds prn  

## 2018-06-19 ENCOUNTER — Other Ambulatory Visit: Payer: Self-pay | Admitting: Family Medicine

## 2018-06-25 ENCOUNTER — Other Ambulatory Visit: Payer: Self-pay | Admitting: Family Medicine

## 2018-07-08 ENCOUNTER — Other Ambulatory Visit: Payer: Self-pay | Admitting: Family Medicine

## 2018-07-15 ENCOUNTER — Other Ambulatory Visit: Payer: Self-pay | Admitting: Family Medicine

## 2018-07-18 ENCOUNTER — Other Ambulatory Visit: Payer: Self-pay | Admitting: General Practice

## 2018-07-18 MED ORDER — CLONAZEPAM 1 MG PO TABS: ORAL_TABLET | ORAL | 1 refills | Status: DC

## 2018-07-18 NOTE — Telephone Encounter (Signed)
Last OV 06/16/18 Clonazepam last filled 01/04/18 #90 with 1

## 2018-08-02 ENCOUNTER — Inpatient Hospital Stay: Admission: RE | Admit: 2018-08-02 | Payer: Medicare Other | Source: Ambulatory Visit

## 2018-08-22 ENCOUNTER — Other Ambulatory Visit: Payer: Self-pay | Admitting: Family Medicine

## 2018-09-04 ENCOUNTER — Other Ambulatory Visit: Payer: Self-pay | Admitting: Family Medicine

## 2018-09-18 ENCOUNTER — Other Ambulatory Visit: Payer: Self-pay | Admitting: Family Medicine

## 2018-09-20 ENCOUNTER — Other Ambulatory Visit: Payer: Self-pay | Admitting: Acute Care

## 2018-09-20 DIAGNOSIS — Z122 Encounter for screening for malignant neoplasm of respiratory organs: Secondary | ICD-10-CM

## 2018-09-20 DIAGNOSIS — F17210 Nicotine dependence, cigarettes, uncomplicated: Secondary | ICD-10-CM

## 2018-09-20 DIAGNOSIS — Z87891 Personal history of nicotine dependence: Secondary | ICD-10-CM

## 2018-10-11 ENCOUNTER — Other Ambulatory Visit: Payer: Self-pay | Admitting: Internal Medicine

## 2018-10-12 ENCOUNTER — Telehealth: Payer: Self-pay | Admitting: *Deleted

## 2018-10-12 NOTE — Telephone Encounter (Signed)

## 2018-10-14 ENCOUNTER — Other Ambulatory Visit: Payer: Self-pay

## 2018-10-14 ENCOUNTER — Ambulatory Visit (INDEPENDENT_AMBULATORY_CARE_PROVIDER_SITE_OTHER)
Admission: RE | Admit: 2018-10-14 | Discharge: 2018-10-14 | Disposition: A | Discharge status: Home / Self Care | Payer: Medicare Other | Source: Ambulatory Visit | Attending: Acute Care | Admitting: Acute Care

## 2018-10-14 DIAGNOSIS — F17210 Nicotine dependence, cigarettes, uncomplicated: Secondary | ICD-10-CM

## 2018-10-14 DIAGNOSIS — Z122 Encounter for screening for malignant neoplasm of respiratory organs: Secondary | ICD-10-CM

## 2018-10-14 DIAGNOSIS — Z87891 Personal history of nicotine dependence: Secondary | ICD-10-CM

## 2018-10-18 ENCOUNTER — Other Ambulatory Visit: Payer: Self-pay

## 2018-10-18 ENCOUNTER — Ambulatory Visit: Payer: Medicare Other | Admitting: Family Medicine

## 2018-10-18 ENCOUNTER — Ambulatory Visit (INDEPENDENT_AMBULATORY_CARE_PROVIDER_SITE_OTHER): Payer: Medicare Other | Admitting: Family Medicine

## 2018-10-18 ENCOUNTER — Encounter: Payer: Medicare Other | Admitting: Family Medicine

## 2018-10-18 ENCOUNTER — Encounter: Payer: Self-pay | Admitting: Family Medicine

## 2018-10-18 VITALS — BP 134/86 | HR 69 | Temp 97.9°F | Resp 17 | Ht 62.0 in | Wt 234.0 lb

## 2018-10-18 DIAGNOSIS — E559 Vitamin D deficiency, unspecified: Secondary | ICD-10-CM | POA: Diagnosis not present

## 2018-10-18 DIAGNOSIS — N390 Urinary tract infection, site not specified: Secondary | ICD-10-CM | POA: Diagnosis not present

## 2018-10-18 DIAGNOSIS — E113391 Type 2 diabetes mellitus with moderate nonproliferative diabetic retinopathy without macular edema, right eye: Secondary | ICD-10-CM | POA: Diagnosis not present

## 2018-10-18 DIAGNOSIS — Z794 Long term (current) use of insulin: Secondary | ICD-10-CM | POA: Diagnosis not present

## 2018-10-18 DIAGNOSIS — Z0000 Encounter for general adult medical examination without abnormal findings: Secondary | ICD-10-CM

## 2018-10-18 DIAGNOSIS — I471 Supraventricular tachycardia: Secondary | ICD-10-CM | POA: Diagnosis not present

## 2018-10-18 DIAGNOSIS — B373 Candidiasis of vulva and vagina: Secondary | ICD-10-CM | POA: Diagnosis not present

## 2018-10-18 DIAGNOSIS — Z0001 Encounter for general adult medical examination with abnormal findings: Secondary | ICD-10-CM

## 2018-10-18 LAB — LIPID PANEL
Cholesterol: 122 mg/dL (ref 0–200)
HDL: 40 mg/dL (ref >39.00)
LDL Cholesterol: 62 mg/dL (ref 0–99)
NonHDL: 82.22
Total CHOL/HDL Ratio: 3
Triglycerides: 100 mg/dL (ref 0.0–149.0)
VLDL: 20 mg/dL (ref 0.0–40.0)

## 2018-10-18 LAB — CBC WITH DIFFERENTIAL/PLATELET
Basophils Absolute: 0.1 10*3/uL (ref 0.0–0.1)
Basophils Relative: 1.2 % (ref 0.0–3.0)
Eosinophils Absolute: 0.2 10*3/uL (ref 0.0–0.7)
Eosinophils Relative: 2.6 % (ref 0.0–5.0)
HCT: 45.8 % (ref 36.0–46.0)
Hemoglobin: 14.9 g/dL (ref 12.0–15.0)
Lymphocytes Relative: 24.6 % (ref 12.0–46.0)
Lymphs Abs: 1.7 10*3/uL (ref 0.7–4.0)
MCHC: 32.6 g/dL (ref 30.0–36.0)
MCV: 94.1 fl (ref 78.0–100.0)
Monocytes Absolute: 0.5 10*3/uL (ref 0.1–1.0)
Monocytes Relative: 6.8 % (ref 3.0–12.0)
Neutro Abs: 4.6 10*3/uL (ref 1.4–7.7)
Neutrophils Relative %: 64.8 % (ref 43.0–77.0)
Platelets: 160 10*3/uL (ref 150.0–400.0)
RBC: 4.87 Mil/uL (ref 3.87–5.11)
RDW: 14.1 % (ref 11.5–15.5)
WBC: 7.1 10*3/uL (ref 4.0–10.5)

## 2018-10-18 LAB — TSH: TSH: 2.6 u[IU]/mL (ref 0.35–4.50)

## 2018-10-18 LAB — BASIC METABOLIC PANEL
BUN: 16 mg/dL (ref 6–23)
CO2: 28 mEq/L (ref 19–32)
Calcium: 9.2 mg/dL (ref 8.4–10.5)
Chloride: 105 mEq/L (ref 96–112)
Creatinine, Ser: 0.86 mg/dL (ref 0.40–1.20)
GFR: 65.53 mL/min (ref >60.00)
Glucose, Bld: 132 mg/dL — ABNORMAL HIGH (ref 70–99)
Potassium: 4.9 mEq/L (ref 3.5–5.1)
Sodium: 140 mEq/L (ref 135–145)

## 2018-10-18 LAB — HEPATIC FUNCTION PANEL
ALT: 14 U/L (ref 0–35)
AST: 16 U/L (ref 0–37)
Albumin: 4.1 g/dL (ref 3.5–5.2)
Alkaline Phosphatase: 54 U/L (ref 39–117)
Bilirubin, Direct: 0.1 mg/dL (ref 0.0–0.3)
Total Bilirubin: 0.5 mg/dL (ref 0.2–1.2)
Total Protein: 6.3 g/dL (ref 6.0–8.3)

## 2018-10-18 LAB — HEMOGLOBIN A1C: Hgb A1c MFr Bld: 6.6 % — ABNORMAL HIGH (ref 4.6–6.5)

## 2018-10-18 LAB — VITAMIN D 25 HYDROXY (VIT D DEFICIENCY, FRACTURES): VITD: 28.98 ng/mL — ABNORMAL LOW (ref 30.00–100.00)

## 2018-10-18 MED ORDER — FUROSEMIDE 20 MG PO TABS: 20.0000 mg | ORAL_TABLET | Freq: Every day | ORAL | 3 refills | Status: DC

## 2018-10-18 MED ORDER — FLUCONAZOLE 150 MG PO TABS: 150.0000 mg | ORAL_TABLET | Freq: Once | ORAL | 0 refills | Status: AC

## 2018-10-18 NOTE — Assessment & Plan Note (Signed)
Pt's PE WNL w/ exception of obesity.  UTD on colonoscopy, mammo, immunizations.  Foot exam done today.  Check labs.  Anticipatory guidance provided.

## 2018-10-18 NOTE — Progress Notes (Signed)
   Subjective:    Patient ID: Lacey [name], female    DOB: 15-Jul-1950, 68 y.o.   MRN: [id number]  HPI CPE- UTD on colonoscopy, immunizations, mammo, eye exam.  Due for foot exam.   Review of Systems Patient reports no vision/ hearing changes, adenopathy,fever, weight change,  persistant/recurrent hoarseness , swallowing issues, chest pain, palpitations, edema, persistant/recurrent cough, hemoptysis, dyspnea (rest/exertional/paroxysmal nocturnal), gastrointestinal bleeding (melena, rectal bleeding), abdominal pain, significant heartburn, bowel changes, Gyn symptoms (abnormal  bleeding, pain),  syncope, focal weakness, memory loss, numbness & tingling, skin/hair/nail changes, abnormal bruising or bleeding, anxiety, or depression.   + recurrent UTIs, vaginal itching.  Likely due to Jardiance.  Currently on abx for UTI.    Objective:   Physical Exam General Appearance:    Alert, cooperative, no distress, appears stated age  Head:    Normocephalic, without obvious abnormality, atraumatic  Eyes:    PERRL, conjunctiva/corneas clear, EOM's intact, fundi    benign, both eyes  Ears:    Normal TM's and external ear canals, both ears  Nose:   Nares normal, septum midline, mucosa normal, no drainage    or sinus tenderness  Throat:   Lips, mucosa, and tongue normal; teeth and gums normal  Neck:   Supple, symmetrical, trachea midline, no adenopathy;    Thyroid: no enlargement/tenderness/nodules  Back:     Symmetric, no curvature, ROM normal, no CVA tenderness  Lungs:     Clear to auscultation bilaterally, respirations unlabored  Chest Wall:    No tenderness or deformity   Heart:    Regular rate and rhythm, S1 and S2 normal, no murmur, rub   or gallop  Breast Exam:    Deferred to mammo  Abdomen:     Soft, non-tender, bowel sounds active all four quadrants,    no masses, no organomegaly  Genitalia:    Deferred  Rectal:    Extremities:   Extremities normal, atraumatic, no cyanosis or edema  Pulses:    2+ and symmetric all extremities  Skin:   Skin color, texture, turgor normal, no rashes or lesions  Lymph nodes:   Cervical, supraclavicular, and axillary nodes normal  Neurologic:   CNII-XII intact, normal strength, sensation and reflexes    throughout          Assessment & Plan:

## 2018-10-18 NOTE — Assessment & Plan Note (Signed)
Ongoing issue for pt.  Stressed need for healthy diet and regular exercise.  Check labs.  Continue to follow.

## 2018-10-18 NOTE — Assessment & Plan Note (Signed)
Pt has hx of this.  Check labs and replete prn. 

## 2018-10-18 NOTE — Assessment & Plan Note (Signed)
Following w/ Dr Caryl Comes.  Currently stable

## 2018-10-18 NOTE — Patient Instructions (Signed)
Follow up in 3-4 months to recheck sugars We'll notify you of your lab results and make any changes if needed Continue to work on healthy diet and regular exercise- you can do it! Take the Diflucan to help w/ the itching STOP the Jardiance We'll start a new medication based on your lab results Call with any questions or concerns Stay Safe!!!

## 2018-10-18 NOTE — Assessment & Plan Note (Signed)
Chronic problem.  Will have to stop Jardiance due to recurrent UTIs and yeast.  Start Diflucan.  Suspect I will start a DPP4 based on lab results.  Foot exam done today.  UTD on eye exam, on ACE.  Check labs.  Adjust meds prn

## 2018-10-19 ENCOUNTER — Other Ambulatory Visit: Payer: Self-pay | Admitting: Acute Care

## 2018-10-19 DIAGNOSIS — Z122 Encounter for screening for malignant neoplasm of respiratory organs: Secondary | ICD-10-CM

## 2018-10-19 DIAGNOSIS — Z87891 Personal history of nicotine dependence: Secondary | ICD-10-CM

## 2018-10-19 DIAGNOSIS — F17210 Nicotine dependence, cigarettes, uncomplicated: Secondary | ICD-10-CM

## 2018-11-07 ENCOUNTER — Encounter: Payer: Self-pay | Admitting: Family Medicine

## 2018-11-14 ENCOUNTER — Other Ambulatory Visit: Payer: Self-pay | Admitting: Family Medicine

## 2018-11-18 ENCOUNTER — Other Ambulatory Visit: Payer: Self-pay | Admitting: Family Medicine

## 2018-11-28 ENCOUNTER — Other Ambulatory Visit: Payer: Self-pay | Admitting: Family Medicine

## 2018-12-07 ENCOUNTER — Other Ambulatory Visit: Payer: Self-pay | Admitting: Family Medicine

## 2018-12-12 ENCOUNTER — Other Ambulatory Visit: Payer: Self-pay | Admitting: Family Medicine

## 2018-12-14 LAB — HM DIABETES EYE EXAM

## 2018-12-24 ENCOUNTER — Other Ambulatory Visit: Payer: Self-pay | Admitting: Family Medicine

## 2018-12-24 ENCOUNTER — Other Ambulatory Visit: Payer: Self-pay | Admitting: Internal Medicine

## 2018-12-26 NOTE — Telephone Encounter (Signed)
Last OV 10/18/18 Trazodone last filled 07/11/18 #90 with 1 Clonazepam last filled 07/18/18 #90 with 1

## 2018-12-27 LAB — HM MAMMOGRAPHY

## 2019-01-03 ENCOUNTER — Encounter: Payer: Self-pay | Admitting: General Practice

## 2019-01-30 ENCOUNTER — Ambulatory Visit (INDEPENDENT_AMBULATORY_CARE_PROVIDER_SITE_OTHER): Payer: Medicare Other | Admitting: Family Medicine

## 2019-01-30 ENCOUNTER — Other Ambulatory Visit: Payer: Self-pay

## 2019-01-30 ENCOUNTER — Encounter: Payer: Self-pay | Admitting: Family Medicine

## 2019-01-30 VITALS — BP 125/82 | HR 64 | Temp 97.8°F | Resp 16 | Ht 62.0 in | Wt 229.0 lb

## 2019-01-30 DIAGNOSIS — E113391 Type 2 diabetes mellitus with moderate nonproliferative diabetic retinopathy without macular edema, right eye: Secondary | ICD-10-CM

## 2019-01-30 DIAGNOSIS — Z794 Long term (current) use of insulin: Secondary | ICD-10-CM | POA: Diagnosis not present

## 2019-01-30 LAB — BASIC METABOLIC PANEL
BUN: 13 mg/dL (ref 6–23)
CO2: 29 mEq/L (ref 19–32)
Calcium: 9.3 mg/dL (ref 8.4–10.5)
Chloride: 105 mEq/L (ref 96–112)
Creatinine, Ser: 0.97 mg/dL (ref 0.40–1.20)
GFR: 56.99 mL/min — ABNORMAL LOW (ref >60.00)
Glucose, Bld: 100 mg/dL — ABNORMAL HIGH (ref 70–99)
Potassium: 4.9 mEq/L (ref 3.5–5.1)
Sodium: 142 mEq/L (ref 135–145)

## 2019-01-30 LAB — HEMOGLOBIN A1C: Hgb A1c MFr Bld: 6.7 % — ABNORMAL HIGH (ref 4.6–6.5)

## 2019-01-30 NOTE — Assessment & Plan Note (Signed)
Chronic problem.  Pt is having asymptomatic lows which is concerning.  Due to this, will decrease Lantus to 70 units nightly and decrease Humalog to 25 units TID.  Pt is to continue to monitor home CBGs and if consistently low we will need to make further adjustments.  Pt expressed understanding and is in agreement w/ plan.

## 2019-01-30 NOTE — Progress Notes (Signed)
   Subjective:    Patient ID: Lacey [name], female    DOB: 1951/03/01, 68 y.o.   MRN: [id number]  HPI DM- chronic problem, on Lantus 75 units nightly, Humalog 30 units TID.  A1C was 6.6.  Now exercising regularly.  Has eliminated sweets.  Is down 5 lbs.  Having asymptomatic lows- only knows that sugars are in the 40s b/c she checks them.  UTD on foot exam, eye exam.  On ARB for renal protection.  Denies CP, SOB, abd pain, N/V.  CBG 65 this AM.   Review of Systems For ROS see HPI     Objective:   Physical Exam Vitals signs reviewed.  Constitutional:      General: She is not in acute distress.    Appearance: Normal appearance. She is well-developed. She is obese.  HENT:     Head: Normocephalic and atraumatic.  Eyes:     Conjunctiva/sclera: Conjunctivae normal.     Pupils: Pupils are equal, round, and reactive to light.  Neck:     Musculoskeletal: Normal range of motion and neck supple.     Thyroid: No thyromegaly.  Cardiovascular:     Rate and Rhythm: Normal rate and regular rhythm.     Heart sounds: Normal heart sounds. No murmur.  Pulmonary:     Effort: Pulmonary effort is normal. No respiratory distress.     Breath sounds: Normal breath sounds.  Abdominal:     General: There is no distension.     Palpations: Abdomen is soft.     Tenderness: There is no abdominal tenderness.  Lymphadenopathy:     Cervical: No cervical adenopathy.  Skin:    General: Skin is warm and dry.  Neurological:     Mental Status: She is alert and oriented to person, place, and time.  Psychiatric:        Behavior: Behavior normal.           Assessment & Plan:

## 2019-01-30 NOTE — Patient Instructions (Addendum)
Follow up in 3-4 months to recheck DM, BP, cholesterol We'll notify you of your lab results and make any changes if needed DECREASE the Lantus to 70 units nightly DECREASE the Humalog to 25 units 3x/day Continue to work on healthy diet and regular exercise- you're doing great! Call with any questions or concerns Hang in there!!!

## 2019-01-31 ENCOUNTER — Encounter: Payer: Self-pay | Admitting: General Practice

## 2019-02-21 ENCOUNTER — Other Ambulatory Visit: Payer: Self-pay | Admitting: Family Medicine

## 2019-03-03 ENCOUNTER — Other Ambulatory Visit: Payer: Self-pay | Admitting: Family Medicine

## 2019-03-16 ENCOUNTER — Other Ambulatory Visit: Payer: Self-pay | Admitting: Family Medicine

## 2019-04-03 ENCOUNTER — Encounter: Payer: Self-pay | Admitting: Internal Medicine

## 2019-04-03 ENCOUNTER — Ambulatory Visit: Payer: Medicare Other | Admitting: Internal Medicine

## 2019-04-03 ENCOUNTER — Other Ambulatory Visit: Payer: Self-pay

## 2019-04-03 VITALS — BP 138/66 | HR 61 | Ht 62.0 in | Wt 229.2 lb

## 2019-04-03 DIAGNOSIS — I471 Supraventricular tachycardia: Secondary | ICD-10-CM

## 2019-04-03 MED ORDER — VERAPAMIL HCL ER 180 MG PO TBCR: 180.0000 mg | EXTENDED_RELEASE_TABLET | Freq: Every day | ORAL | 2 refills | Status: DC

## 2019-04-03 NOTE — Patient Instructions (Addendum)
Medication Instructions:  Your physician has recommended you make the following change in your medication:  1. STOP Metoprolol Succinate -- you will slowly wean off this medication.  Take 25 mg twice daily for 5 days, then reduce to 25 mg once daily for 5 days, then stop it. 2. Once Metoprolol weaned off/stopped -- start Verapamil 180 mg once daily  * If you need a refill on your cardiac medications before your next appointment, please call your pharmacy.   Labwork: None ordered  Testing/Procedures: None ordered  Follow-Up: At [facility], you and your health needs are our priority.  As part of our continuing mission to provide you with exceptional heart care, we have created designated Provider Care Teams.  These Care Teams include your primary Cardiologist (physician) and Advanced Practice Providers (APPs -  Physician Assistants and Nurse Practitioners) who all work together to provide you with the care you need, when you need it.  You will need a telemedicine follow up appointment in 6-8 weeks.  The office will contact you to arrange this visit.    Thank you for choosing [facility]!!    Any Other Special Instructions Will Be Listed Below (If Applicable).  Verapamil tablets What is this medicine? VERAPAMIL (ver AP a mil) is a calcium-channel blocker. It affects the amount of calcium found in your heart and muscle cells. This relaxes your blood vessels, which can reduce the amount of work the heart has to do. This medicine is used to treat chest pain caused by angina, high blood pressure, and controls heart rate in certain conditions. This medicine may be used for other purposes; ask your health care provider or pharmacist if you have questions. COMMON BRAND NAME(S): Calan What should I tell my health care provider before I take this medicine? They need to know if you have any of these conditions:  heart or blood vessel disease  heart rhythm disturbances such as sick sinus  syndrome, ventricular arrhythmias, Wolff-Parkinson-White syndrome, or Lown-Ganong-Levine syndrome  liver or kidney disease  low blood pressure  an unusual or allergic reaction to verapamil, other medicines, foods, dyes, or preservatives  pregnant or trying to get pregnant  breast-feeding How should I use this medicine? Take this medicine by mouth with a glass of water. Follow the directions on the prescription label. This medicine can be taken with or without food. Take your doses at regular intervals. Do not take your medicine more often than directed. Talk to your pediatrician regarding the use of this medicine in children. Special care may be needed. Overdosage: If you think you have taken too much of this medicine contact a poison control center or emergency room at once. NOTE: This medicine is only for you. Do not share this medicine with others. What if I miss a dose? If you miss a dose, take it as soon as you can. If it is almost time for your next dose, take only that dose. Do not take double or extra doses. What may interact with this medicine? Do not take this medicine with any of the following:  cisapride  disopyramide  dofetilide  grapefruit juice  hawthorn  pimozide  red yeast rice This medicine may also interact with the following medications:  barbiturates such as phenobarbital  cimetidine  cyclosporine  lithium  local anesthetics or general anesthetics  medicines for heart rhythm problems like amiodarone, digoxin, flecainide, procainamide, quinidine  medicines for high blood pressure or heart problems  medicines for seizures like carbamazepine and phenytoin  rifampin, rifabutin or rifapentine  theophylline or aminophylline This list may not describe all possible interactions. Give your health care provider a list of all the medicines, herbs, non-prescription drugs, or dietary supplements you use. Also tell them if you smoke, drink alcohol, or use  illegal drugs. Some items may interact with your medicine. What should I watch for while using this medicine? Check your blood pressure and pulse rate regularly. Ask your doctor or health care professional what your blood pressure and pulse rate should be and when you should contact him or her. Do not suddenly stop taking this medicine. Ask your doctor or health care professional how to gradually reduce the dose. You may get drowsy or dizzy. Do not drive, use machinery, or do anything that needs mental alertness until you know how this medicine affects you. Do not stand or sit up quickly, especially if you are an older patient. This reduces the risk of dizzy or fainting spells. Alcohol may interfere with the effect of this medicine. Avoid alcoholic drinks. What side effects may I notice from receiving this medicine? Side effects that you should report to your doctor or health care professional as soon as possible:  difficulty breathing  dizziness or light headedness  fainting  fast heartbeat, palpitations, irregular heartbeat, or chest pain  skin rash  slow heartbeat  swelling of the legs or ankles Side effects that usually do not require medical attention (report to your doctor or health care professional if they continue or are bothersome):  constipation  facial flushing  headache  nausea, vomiting  sexual dysfunction  weakness or tiredness This list may not describe all possible side effects. Call your doctor for medical advice about side effects. You may report side effects to FDA at 1-800-FDA-1088. Where should I keep my medicine? Keep out of the reach of children. Store at room temperature between 15 and 25 degrees C (59 and 77 degrees F). Protect from light. Keep container tightly closed. Throw away any unused medicine after the expiration date. NOTE: This sheet is a summary. It may not cover all possible information. If you have questions about this medicine, talk to your  doctor, pharmacist, or health care provider.  2020 Elsevier/Gold Standard (2008-01-16 17:23:53)

## 2019-04-03 NOTE — Progress Notes (Signed)
Patient Care Team: Midge Minium, MD as PCP - General Deboraha Sprang, MD as PCP - Cardiology (Cardiology) Deboraha Sprang, MD as Consulting Physician (Cardiology) Almedia Balls, MD as Consulting Physician (Orthopedic Surgery) Dyke Maes, [place] as Consulting Physician (Optometry) Irene Shipper, MD as Consulting Physician (Gastroenterology) Ceasar Mons, MD as Consulting Physician (Urology) Martinique, Amy, MD as Consulting Physician (Dermatology) Marchia Bond, MD as Consulting Physician (Orthopedic Surgery)   HPI  Lacey [name] is a 68 y.o. female w history of palpitations ascribed to  atrial tachycardia  Has hypertension   Mostly at home  Limited by diarrhea  Not evaluated  Still smoking about   The patient denies chest pain,  nocturnal dyspnea, orthopnea or peripheral edema.  There have been no palpitations, lightheadedness or syncope.   Chronic DOE   She has no known CAD per cath 3/06.   Echocardiogram 04/30/10: EF 55-60%, mild LVH, grade 1 diastolic dysfunction.    Myoview done in 04/30/10: EF 63%, no scar, no ischemia.   Repeat scanning 6/16 was normal   Date Cr K HgbA1c  5/18  0.91 4.6 7.4   8/19 1.0 3.8 7.3  9/20 0.97 4.9 6.7    Past Medical History:  Diagnosis Date  . Adenomatous colon polyp    hyperplastic  . Anginal pain ([place])    not recent  . Arthritis    NECK, KNEES, FINGERS, TOES  . Atrial tachycardia ([place]) CARDIOLOGIST - DR Caryl Comes (LAST VISIT AUG 2012)   Echo 12/11: EF 55-60%, Mild LVH, grade 1 diast dysfxn;   holter 1/12: ATach  . Atypical chest pain    a. 07/2004 Cath: Clean cors;  b. 04/2010 Myoview: EF 63%, no ischemia  . Blood transfusion without reported diagnosis 1969  . Chronic kidney disease   . Diabetes mellitus, type 2 (H[facility])    ORAL AND INSULIN MEDS (LAST A1C  7.3)  . Diverticulosis   . Erythema CURRENT-- CLOSED ABD. WALL ABSCESS   PER PCP NOTE (03-31-11)- MRSA-- TAKES DOXYCYCLINE  . GERD (gastroesophageal  reflux disease)    CONTROLLED W/ PREVACID  . History of kidney stones 2012  . Hyperlipidemia   . Hypertension   . Insomnia    TAKES MEDS PRN  . Neuropathy, peripheral   . Obesity (BMI 30-39.9) 02/19/2015  . Pneumonia    as child  . Post op infection 11/07/12   left bunionectomy  . Restless leg syndrome   . Sepsis, urinary HISTORY - 2004  . Sleep apnea   . Vitamin D deficiency     Past Surgical History:  Procedure Laterality Date  . ABDOMINAL HYSTERECTOMY  1995   ovaries remain  . BUNIONECTOMY Left 10/24/12  . BUNIONECTOMY Right 05/17/2017  . CARDIAC CATHETERIZATION  07/10/04  . CARPAL TUNNEL RELEASE  2000   RIGHT  . CERVICAL FUSION  10-12-07   C5 - 7  . COLONOSCOPY    . CYSTOSCOPY WITH RETROGRADE PYELOGRAM, URETEROSCOPY AND STENT PLACEMENT Left 11/28/2012   Procedure: LEFT RETROGRADE PYELOGRAM, LEFT URETEROSCOPY, ;  Surgeon: Claybon Jabs, MD;  Location: [facility];  Service: Urology;  Laterality: Left;  . CYSTOSCOPY/RETROGRADE/URETEROSCOPY/STONE EXTRACTION WITH BASKET  X2 2004 & 2009   LEFT   . GASTRIC BYPASS  1981  . KNEE ARTHROSCOPY  AUG 2012   LEFT  . LAPAROSCOPIC CHOLECYSTECTOMY  2001  . left thumb joint surgery  2013  . PERCUTANEOUS NEPHROSTOLITHOTOMY  02-27-11   LEFT  . POLYPECTOMY    .  RIGHT THUMB JOINT SURG.   MAY 2011  . TRIGGER FINGER RELEASE  2010   RIGHT THUMB  . UPPER GASTROINTESTINAL ENDOSCOPY    . URETERAL STENT PLACEMENT  X2  2004  &  2009   LEFT  . URETEROSCOPY  04/06/2011   Procedure: URETEROSCOPY;  Surgeon: Claybon Jabs, MD;  Location[facility];  Service: Urology;  Laterality: Left;  L Ureteroscopy Laser Litho & Stent     Current Outpatient Medications  Medication Sig Dispense Refill  . aspirin EC 81 MG tablet Take 81 mg by mouth daily.    Marland Kitchen atorvastatin (LIPITOR) 20 MG tablet TAKE 1 TABLET BY MOUTH  DAILY 90 tablet 1  . celecoxib (CELEBREX) 200 MG capsule Take 200 mg by mouth every morning.    . clonazePAM (KLONOPIN) 1 MG  tablet TAKE 1 TABLET BY MOUTH  NIGHTLY AT BEDTIME FOR  RESTLESS LEG 90 tablet 1  . clotrimazole-betamethasone (LOTRISONE) cream Apply 1 application topically 2 (two) times daily. 45 g 0  . diphenoxylate-atropine (LOMOTIL) 2.5-0.025 MG per tablet Take 1 tablet by mouth 4 (four) times daily as needed for diarrhea or loose stools. 30 tablet 0  . fenofibrate 160 MG tablet TAKE 1 TABLET BY MOUTH  DAILY 90 tablet 3  . furosemide (LASIX) 20 MG tablet Take 1 tablet (20 mg total) by mouth daily. 90 tablet 3  . gabapentin (NEURONTIN) 300 MG capsule TAKE 1 CAPSULE BY MOUTH TWO TIMES DAILY 180 capsule 1  . hydrocortisone valerate cream (WESTCORT) 0.2 % Apply 1 application topically as needed (for skin irritation).    . Insulin Glargine (LANTUS SOLOSTAR) 100 UNIT/ML Solostar Pen Inject 70 Units into the skin daily.    . insulin lispro (HUMALOG) 100 UNIT/ML injection Inject 25 Units into the skin 3 (three) times daily before meals.    . Insulin Pen Needle (B-D ULTRAFINE III SHORT PEN) 31G X 8 MM MISC USE 4 TIMES DAILY 360 each 1  . Lancets (ONETOUCH DELICA PLUS 123XX123) MISC USE 4 TIMES DAILY 400 each 1  . lansoprazole (PREVACID) 15 MG capsule Take 15 mg by mouth daily.    Marland Kitchen levothyroxine (SYNTHROID) 50 MCG tablet TAKE 1 TABLET BY MOUTH  DAILY BEFORE BREAKFAST 90 tablet 1  . metoprolol succinate (TOPROL-XL) 50 MG 24 hr tablet TAKE 1 TABLET BY MOUTH 2  TIMES DAILY WITH OR  IMMEDIATELY FOLLOWING A  MEAL 180 tablet 1  . ondansetron (ZOFRAN) 4 MG tablet Take 1 tablet (4 mg total) by mouth every 8 (eight) hours as needed for nausea or vomiting. 12 tablet 0  . ONETOUCH VERIO test strip TEST 4 TIMES DAILY 400 each 6  . traZODone (DESYREL) 100 MG tablet TAKE 1 TABLET BY MOUTH AT  BEDTIME 90 tablet 3  . valsartan (DIOVAN) 320 MG tablet TAKE 1 TABLET BY MOUTH  DAILY 90 tablet 3   No current facility-administered medications for this visit.     No Known Allergies  Review of Systems negative except from HPI and PMH   Physical Exam BP 138/66   Pulse 61   Ht 5\' 2"  (1.575 m)   Wt 229 lb 3.2 oz (104 kg)   LMP 06/06/1993   SpO2 95%   BMI 41.92 kg/m  Well developed and nourished in no acute distress HENT normal Neck supple with JVP-  Flat  Clear Regular rate and rhythm, no murmurs or gallops Abd-soft with active BS No Clubbing cyanosis edema Skin-warm and dry A & Oriented  Grossly normal sensory and motor function  ECG sinus @ 61 15/09/39 NSTT unchanged 2018    Assessment and  Plan   Atrial tachycardia     HFpEF  Hypertension  Morbidly obese  Diarrhea  Smoking  Biggest complaint today is diarrhea which is quite limiting.  Maybe is related to her beta-blocker.  We will wean her off of her beta-blocker and try her on verapamil 180 daily.  Blood pressure is reasonably controlled.  Beta-blockers have done a good job with her atrial tachycardia; hopefully the verapamil will do the same.  Encouraged to stop smoking.

## 2019-04-17 ENCOUNTER — Ambulatory Visit (INDEPENDENT_AMBULATORY_CARE_PROVIDER_SITE_OTHER): Payer: Medicare Other | Admitting: Family Medicine

## 2019-04-17 ENCOUNTER — Other Ambulatory Visit: Payer: Self-pay

## 2019-04-17 ENCOUNTER — Encounter: Payer: Self-pay | Admitting: Family Medicine

## 2019-04-17 VITALS — BP 145/73 | HR 85 | Temp 97.3°F | Ht 62.0 in | Wt 226.0 lb

## 2019-04-17 DIAGNOSIS — E113391 Type 2 diabetes mellitus with moderate nonproliferative diabetic retinopathy without macular edema, right eye: Secondary | ICD-10-CM

## 2019-04-17 DIAGNOSIS — E1169 Type 2 diabetes mellitus with other specified complication: Secondary | ICD-10-CM | POA: Diagnosis not present

## 2019-04-17 DIAGNOSIS — E038 Other specified hypothyroidism: Secondary | ICD-10-CM

## 2019-04-17 DIAGNOSIS — I110 Hypertensive heart disease with heart failure: Secondary | ICD-10-CM | POA: Diagnosis not present

## 2019-04-17 DIAGNOSIS — L659 Nonscarring hair loss, unspecified: Secondary | ICD-10-CM

## 2019-04-17 DIAGNOSIS — Z794 Long term (current) use of insulin: Secondary | ICD-10-CM

## 2019-04-17 DIAGNOSIS — E785 Hyperlipidemia, unspecified: Secondary | ICD-10-CM

## 2019-04-17 MED ORDER — DICLOFENAC SODIUM 1 % EX GEL: 2.0000 g | Freq: Four times a day (QID) | CUTANEOUS | 1 refills | Status: DC

## 2019-04-17 MED ORDER — GABAPENTIN 300 MG PO CAPS: 300.0000 mg | ORAL_CAPSULE | Freq: Three times a day (TID) | ORAL | 1 refills | Status: DC

## 2019-04-17 NOTE — Progress Notes (Signed)
I have discussed the procedure for the virtual visit with the patient who has given consent to proceed with assessment and treatment.   Jessica L Brodmerkel, CMA     

## 2019-04-17 NOTE — Progress Notes (Signed)
Virtual Visit via Video   I connected with patient on 04/17/19 at  2:00 PM EST by a video enabled telemedicine application and verified that I am speaking with the correct person using two identifiers.  Location patient: Home Location provider: Acupuncturist, Office Persons participating in the virtual visit: Patient, Provider, [place] (Jess B)  I discussed the limitations of evaluation and management by telemedicine and the availability of in person appointments. The patient expressed understanding and agreed to proceed.  Subjective:   HPI:   HTN- chronic problem, on Verapamil 180mg  daily, Valsartan 320mg  daily, Lasix 20mg  daily.  Dr Caryl Comes had been weaning her off Metoprolol and just started Verapamil 4 days ago.  Pt reports increased HR since changing medications.  Has some SOB w/ exertion but this is stable for pt.  Denies HAs, visual changes.  Hyperlipidemia- chronic problem, on Lipitor 20mg  daily, Fenofibrate 160mg  daily.  Denies abd pain, N/V.  Hypothyroid- chronic problem, on Levothyroxine 63mcg daily.  Pt reports decreased energy recently.  DM- chronic problem, on Lantus 70 units nightly and Humalog 25 TID.  UTD on foot exam, eye exam, and on ARB for renal protection.  Is riding exercise bike 3x/week for 30 minutes.  Pt reports sugars have not been good the last 2 weeks.  CBG this AM 138.  Has been 145, 149.  Had 1 reading of 310.  No change in diet recently.  No recent steroids.  Hair loss- dermatology is requesting an iron panel  ROS:   See pertinent positives and negatives per HPI.  Patient Active Problem List   Diagnosis Date Noted  . Radicular low back pain 07/12/2015  . 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 without macular edema ([place]) 04/24/2015  . Severe obesity (BMI >= 40) ([place]) 02/19/2015  . OSA (obstructive sleep apnea) 10/23/2014  . Excessive daytime sleepiness 10/23/2014  . Hypothyroidism 09/07/2014  .  Tobacco abuse 01/15/2014  . Recurrent UTI 09/06/2013  . Orthostatic hypotension 10/05/2012  . Atypical chest pain   . General medical examination 06/22/2011  . Atrial tachycardia ([place]) 11/25/2010  . Pre-operative cardiovascular examination 11/25/2010  . Chronic diarrhea 08/25/2010  . KNEE PAIN 07/14/2010  . DIASTOLIC HEART FAILURE, CHRONIC 06/09/2010  . PERSONAL HISTORY OF COLONIC POLYPS 05/01/2010  . Vitamin D deficiency 01/20/2010  . RESTLESS LEG SYNDROME 01/20/2010  . INSOMNIA 01/20/2010  . Hyperlipidemia associated with type 2 diabetes mellitus ([place]) 01/08/2009  . ANEMIA 01/08/2009  . GERD 01/08/2009  . Benign hypertensive heart disease with heart failure ([place]) 01/08/2009    Social History   Tobacco Use  . Smoking status: Current Every Day Smoker    Packs/day: 0.25    Years: 43.00    Pack years: 10.75    Types: Cigarettes  . Smokeless tobacco: Never Used  . Tobacco comment: 0.75ppd x62yrs, 0.25ppd x39yr (07/12/15). Counseled to quit -smoking, nicotine replacement and smoking cessation classes discussed-3-4 a day as of 09-06-15  Substance Use Topics  . Alcohol use: No    Alcohol/week: 0.0 standard drinks    Current Outpatient Medications:  .  aspirin EC 81 MG tablet, Take 81 mg by mouth daily., Disp: , Rfl:  .  atorvastatin (LIPITOR) 20 MG tablet, TAKE 1 TABLET BY MOUTH  DAILY, Disp: 90 tablet, Rfl: 1 .  celecoxib (CELEBREX) 200 MG capsule, Take 200 mg by mouth every morning., Disp: , Rfl:  .  clonazePAM (KLONOPIN) 1 MG tablet, TAKE 1 TABLET BY MOUTH  NIGHTLY  AT BEDTIME FOR  RESTLESS LEG, Disp: 90 tablet, Rfl: 1 .  clotrimazole-betamethasone (LOTRISONE) cream, Apply 1 application topically 2 (two) times daily., Disp: 45 g, Rfl: 0 .  diphenoxylate-atropine (LOMOTIL) 2.5-0.025 MG per tablet, Take 1 tablet by mouth 4 (four) times daily as needed for diarrhea or loose stools., Disp: 30 tablet, Rfl: 0 .  fenofibrate 160 MG tablet, TAKE 1 TABLET BY MOUTH  DAILY, Disp: 90 tablet,  Rfl: 3 .  furosemide (LASIX) 20 MG tablet, Take 1 tablet (20 mg total) by mouth daily., Disp: 90 tablet, Rfl: 3 .  gabapentin (NEURONTIN) 300 MG capsule, TAKE 1 CAPSULE BY MOUTH TWO TIMES DAILY, Disp: 180 capsule, Rfl: 1 .  hydrocortisone valerate cream (WESTCORT) 0.2 %, Apply 1 application topically as needed (for skin irritation)., Disp: , Rfl:  .  Insulin Glargine (LANTUS SOLOSTAR) 100 UNIT/ML Solostar Pen, Inject 70 Units into the skin daily., Disp: , Rfl:  .  insulin lispro (HUMALOG) 100 UNIT/ML injection, Inject 25 Units into the skin 3 (three) times daily before meals., Disp: , Rfl:  .  Insulin Pen Needle (B-D ULTRAFINE III SHORT PEN) 31G X 8 MM MISC, USE 4 TIMES DAILY, Disp: 360 each, Rfl: 1 .  Lancets (ONETOUCH DELICA PLUS 123XX123) MISC, USE 4 TIMES DAILY, Disp: 400 each, Rfl: 1 .  lansoprazole (PREVACID) 15 MG capsule, Take 15 mg by mouth daily., Disp: , Rfl:  .  levothyroxine (SYNTHROID) 50 MCG tablet, TAKE 1 TABLET BY MOUTH  DAILY BEFORE BREAKFAST, Disp: 90 tablet, Rfl: 1 .  ondansetron (ZOFRAN) 4 MG tablet, Take 1 tablet (4 mg total) by mouth every 8 (eight) hours as needed for nausea or vomiting., Disp: 12 tablet, Rfl: 0 .  ONETOUCH VERIO test strip, TEST 4 TIMES DAILY, Disp: 400 each, Rfl: 6 .  traZODone (DESYREL) 100 MG tablet, TAKE 1 TABLET BY MOUTH AT  BEDTIME, Disp: 90 tablet, Rfl: 3 .  valsartan (DIOVAN) 320 MG tablet, TAKE 1 TABLET BY MOUTH  DAILY, Disp: 90 tablet, Rfl: 3 .  verapamil (CALAN-SR) 180 MG CR tablet, Take 1 tablet (180 mg total) by mouth daily., Disp: 30 tablet, Rfl: 2  No Known Allergies  Objective:   BP (!) 145/73   Pulse 85   Temp (!) 97.3 F (36.3 C) (Temporal)   Ht 5\' 2"  (1.575 m)   Wt 226 lb (102.5 kg)   LMP 06/06/1993   BMI 41.34 kg/m  AAOx3, NAD NCAT, EOMI No obvious CN deficits Coloring WNL Pt is able to speak clearly, coherently without shortness of breath or increased work of breathing.  Thought process is linear.  Mood is appropriate.     Assessment and Plan:   HTN- deteriorated.  BP is elevated today but pt is in the process of switching her Metoprolol to Verapamil per cards.  Denies change in CP or SOB compared to previous.  Will check labs.  No anticipated med changes.  Hyperlipidemia- chronic problem, tolerating statin w/o difficulty.  Applauded her efforts at exercise.  Check labs.  Adjust meds prn   Hypothyroid- chronic problem.  Pt reports decreased energy recently.  Check labs.  Adjust meds prn   DM- ongoing issue for pt.  She reports readings have been labile.  Denies changes to diet and has not had recent steroids.  UTD on foot exam, eye exam, and on ARB for renal protection.  Encouraged low carb diet in addition to regular exercise.  Check labs.  Adjust meds prn   Hair loss- derm  is requesting iron panel  Annye Asa, MD 04/17/2019

## 2019-04-20 ENCOUNTER — Ambulatory Visit (INDEPENDENT_AMBULATORY_CARE_PROVIDER_SITE_OTHER): Payer: Medicare Other

## 2019-04-20 ENCOUNTER — Other Ambulatory Visit: Payer: Self-pay | Admitting: General Practice

## 2019-04-20 ENCOUNTER — Telehealth: Payer: Self-pay

## 2019-04-20 ENCOUNTER — Other Ambulatory Visit: Payer: Self-pay

## 2019-04-20 DIAGNOSIS — E785 Hyperlipidemia, unspecified: Secondary | ICD-10-CM | POA: Diagnosis not present

## 2019-04-20 DIAGNOSIS — E038 Other specified hypothyroidism: Secondary | ICD-10-CM | POA: Diagnosis not present

## 2019-04-20 DIAGNOSIS — Z794 Long term (current) use of insulin: Secondary | ICD-10-CM | POA: Diagnosis not present

## 2019-04-20 DIAGNOSIS — E1169 Type 2 diabetes mellitus with other specified complication: Secondary | ICD-10-CM

## 2019-04-20 DIAGNOSIS — L659 Nonscarring hair loss, unspecified: Secondary | ICD-10-CM

## 2019-04-20 DIAGNOSIS — I110 Hypertensive heart disease with heart failure: Secondary | ICD-10-CM | POA: Diagnosis not present

## 2019-04-20 DIAGNOSIS — E113391 Type 2 diabetes mellitus with moderate nonproliferative diabetic retinopathy without macular edema, right eye: Secondary | ICD-10-CM | POA: Diagnosis not present

## 2019-04-20 DIAGNOSIS — R350 Frequency of micturition: Secondary | ICD-10-CM | POA: Diagnosis not present

## 2019-04-20 DIAGNOSIS — R81 Glycosuria: Secondary | ICD-10-CM

## 2019-04-20 LAB — POCT URINALYSIS DIPSTICK
Bilirubin, UA: NEGATIVE
Blood, UA: NEGATIVE
Glucose, UA: POSITIVE — AB
Ketones, UA: NEGATIVE
Nitrite, UA: POSITIVE
Protein, UA: NEGATIVE
Spec Grav, UA: 1.01 (ref 1.010–1.025)
Urobilinogen, UA: 0.2 E.U./dL
pH, UA: 6 (ref 5.0–8.0)

## 2019-04-20 LAB — LIPID PANEL
Cholesterol: 139 mg/dL (ref 0–200)
HDL: 40.4 mg/dL (ref >39.00)
LDL Cholesterol: 71 mg/dL (ref 0–99)
NonHDL: 98.93
Total CHOL/HDL Ratio: 3
Triglycerides: 142 mg/dL (ref 0.0–149.0)
VLDL: 28.4 mg/dL (ref 0.0–40.0)

## 2019-04-20 LAB — BASIC METABOLIC PANEL
BUN: 22 mg/dL (ref 6–23)
CO2: 28 mEq/L (ref 19–32)
Calcium: 9.4 mg/dL (ref 8.4–10.5)
Chloride: 103 mEq/L (ref 96–112)
Creatinine, Ser: 0.96 mg/dL (ref 0.40–1.20)
GFR: 57.64 mL/min — ABNORMAL LOW (ref >60.00)
Glucose, Bld: 162 mg/dL — ABNORMAL HIGH (ref 70–99)
Potassium: 4.1 mEq/L (ref 3.5–5.1)
Sodium: 140 mEq/L (ref 135–145)

## 2019-04-20 LAB — HEMOGLOBIN A1C: Hgb A1c MFr Bld: 6.9 % — ABNORMAL HIGH (ref 4.6–6.5)

## 2019-04-20 LAB — CBC WITH DIFFERENTIAL/PLATELET
Basophils Absolute: 0.1 10*3/uL (ref 0.0–0.1)
Basophils Relative: 0.8 % (ref 0.0–3.0)
Eosinophils Absolute: 0.1 10*3/uL (ref 0.0–0.7)
Eosinophils Relative: 1.7 % (ref 0.0–5.0)
HCT: 43.5 % (ref 36.0–46.0)
Hemoglobin: 14.3 g/dL (ref 12.0–15.0)
Lymphocytes Relative: 26.3 % (ref 12.0–46.0)
Lymphs Abs: 2.1 10*3/uL (ref 0.7–4.0)
MCHC: 33 g/dL (ref 30.0–36.0)
MCV: 94.7 fl (ref 78.0–100.0)
Monocytes Absolute: 0.6 10*3/uL (ref 0.1–1.0)
Monocytes Relative: 8.3 % (ref 3.0–12.0)
Neutro Abs: 4.9 10*3/uL (ref 1.4–7.7)
Neutrophils Relative %: 62.9 % (ref 43.0–77.0)
Platelets: 169 10*3/uL (ref 150.0–400.0)
RBC: 4.59 Mil/uL (ref 3.87–5.11)
RDW: 13.5 % (ref 11.5–15.5)
WBC: 7.8 10*3/uL (ref 4.0–10.5)

## 2019-04-20 LAB — HEPATIC FUNCTION PANEL
ALT: 14 U/L (ref 0–35)
AST: 16 U/L (ref 0–37)
Albumin: 4.2 g/dL (ref 3.5–5.2)
Alkaline Phosphatase: 66 U/L (ref 39–117)
Bilirubin, Direct: 0.1 mg/dL (ref 0.0–0.3)
Total Bilirubin: 0.4 mg/dL (ref 0.2–1.2)
Total Protein: 6.5 g/dL (ref 6.0–8.3)

## 2019-04-20 LAB — TSH: TSH: 2.5 u[IU]/mL (ref 0.35–4.50)

## 2019-04-20 MED ORDER — CEPHALEXIN 500 MG PO CAPS: 500.0000 mg | ORAL_CAPSULE | Freq: Two times a day (BID) | ORAL | 0 refills | Status: DC

## 2019-04-20 NOTE — Addendum Note (Signed)
Addended by: Doran Clay A on: 04/20/2019 01:12 PM   Modules accepted: Orders

## 2019-04-20 NOTE — Telephone Encounter (Signed)
POCT UA has been resulted.

## 2019-04-20 NOTE — Addendum Note (Signed)
Addended by: Doran Clay A on: 04/20/2019 04:18 PM   Modules accepted: Orders

## 2019-04-20 NOTE — Telephone Encounter (Signed)
Patient seen today for nurse visit. During lab work, patient stated that she thinks she is dehydrated. After taking FUROSEMIDE in the mornings, feels the urgency to urinate, but barely producing any urine. States she is experiencing fatigue. Also states that she stopped Jardiance back in June and was told to start taking Januvia, but never received any prescription for medication. Forgot to mention all of this during her virtual visit with PCP on 12.14.20. I had patient leave an urine sample incase PCP wanted to add on any tests and informed patient that I would route message to [name]. Please advise

## 2019-04-20 NOTE — Telephone Encounter (Signed)
Pt does not require Januvia right now.  In fact, she was having low blood sugar so we decreased her Lantus at her September visit.  Please dip her urine to assess for possible UTI given the urgency to urinate but minimal output.  Also, since she is on Furosemide, she needs to increase her water intake

## 2019-04-21 LAB — IRON,TIBC AND FERRITIN PANEL
%SAT: 22 % (calc) (ref 16–45)
Ferritin: 53 ng/mL (ref 16–288)
Iron: 89 ug/dL (ref 45–160)
TIBC: 399 mcg/dL (calc) (ref 250–450)

## 2019-04-22 LAB — URINE CULTURE
MICRO NUMBER:: 1210763
SPECIMEN QUALITY:: ADEQUATE

## 2019-04-30 ENCOUNTER — Other Ambulatory Visit: Payer: Self-pay | Admitting: Family Medicine

## 2019-05-06 ENCOUNTER — Other Ambulatory Visit: Payer: Self-pay | Admitting: Family Medicine

## 2019-05-08 ENCOUNTER — Ambulatory Visit: Payer: Medicare Other | Admitting: Family Medicine

## 2019-05-12 ENCOUNTER — Encounter: Payer: Self-pay | Admitting: Family Medicine

## 2019-05-18 ENCOUNTER — Other Ambulatory Visit: Payer: Self-pay

## 2019-05-18 ENCOUNTER — Telehealth (INDEPENDENT_AMBULATORY_CARE_PROVIDER_SITE_OTHER): Payer: Medicare PPO | Admitting: Internal Medicine

## 2019-05-18 VITALS — BP 146/63 | HR 74 | Ht 62.0 in | Wt 227.0 lb

## 2019-05-18 DIAGNOSIS — I471 Supraventricular tachycardia: Secondary | ICD-10-CM

## 2019-05-18 DIAGNOSIS — I5032 Chronic diastolic (congestive) heart failure: Secondary | ICD-10-CM

## 2019-05-18 NOTE — Progress Notes (Signed)
Electrophysiology TeleHealth Note   Due to national recommendations of social distancing due to COVID 19, an audio/video telehealth visit is felt to be most appropriate for this patient at this time.  See MyChart message from today for the patient's consent to telehealth for [name].   Date:  05/18/2019   ID:  Lacey [name], DOB 06/19/1950, MRN [id number]  Location: patient's home  Provider location: [address], [place]  Evaluation Performed: Follow-up visit  PCP:  Midge Minium, MD  Cardiologist:   Electrophysiologist:  SK   Chief Complaint:     History of Present Illness:    Lacey [name] is a 69 y.o. female who presents via audio/video conferencing for a telehealth visit today.  Since last being seen in our clinic for atrial tachycardia   the patient reports some intermittent palpitations.  Also with edema--  Her volume intake is exuberant and she has pitting edema which she addresses by drinking fluids  She has no known CAD per cath 3/06.   Echocardiogram 04/30/10: EF 55-60%, mild LVH, grade 1 diastolic dysfunction.    Myoview done in 04/30/10: EF 63%, no scar, no ischemia.   Repeat scanning 6/16 was normal   Date Cr K HgbA1c  5/18  0.91 4.6 7.4   8/19 1.0 3.8 7.3  9/20 0.97 4.9 6.7  12/20 0.96 4.1    Diarrhea is much better off the metoprolol; her BP machine however gives her a signal with recording of "irregular heart beat."  HR have been in the 70 ranges  The patient denies symptoms of fevers, chills, cough, or new SOB worrisome for COVID 19.    Past Medical History:  Diagnosis Date  . Adenomatous colon polyp    hyperplastic  . Anginal pain ([place])    not recent  . Arthritis    NECK, KNEES, FINGERS, TOES  . Atrial tachycardia ([place]) CARDIOLOGIST - DR Caryl Comes (LAST VISIT AUG 2012)   Echo 12/11: EF 55-60%, Mild LVH, grade 1 diast dysfxn;   holter 1/12: ATach  . Atypical chest pain    a. 07/2004 Cath: Clean cors;  b. 04/2010  Myoview: EF 63%, no ischemia  . Blood transfusion without reported diagnosis 1969  . Chronic kidney disease   . Diabetes mellitus, type 2 ([facility])    ORAL AND INSULIN MEDS (LAST A1C  7.3)  . Diverticulosis   . Erythema CURRENT-- CLOSED ABD. WALL ABSCESS   PER PCP NOTE (03-31-11)- MRSA-- TAKES DOXYCYCLINE  . GERD (gastroesophageal reflux disease)    CONTROLLED W/ PREVACID  . History of kidney stones 2012  . Hyperlipidemia   . Hypertension   . Insomnia    TAKES MEDS PRN  . Neuropathy, peripheral   . Obesity (BMI 30-39.9) 02/19/2015  . Pneumonia    as child  . Post op infection 11/07/12   left bunionectomy  . Restless leg syndrome   . Sepsis, urinary HISTORY - 2004  . Sleep apnea   . Vitamin D deficiency     Past Surgical History:  Procedure Laterality Date  . ABDOMINAL HYSTERECTOMY  1995   ovaries remain  . BUNIONECTOMY Left 10/24/12  . BUNIONECTOMY Right 05/17/2017  . CARDIAC CATHETERIZATION  07/10/04  . CARPAL TUNNEL RELEASE  2000   RIGHT  . CERVICAL FUSION  10-12-07   C5 - 7  . COLONOSCOPY    . CYSTOSCOPY WITH RETROGRADE PYELOGRAM, URETEROSCOPY AND STENT PLACEMENT Left 11/28/2012   Procedure: LEFT RETROGRADE PYELOGRAM, LEFT URETEROSCOPY, ;  Surgeon: Claybon Jabs, MD;  Location: [facility];  Service: Urology;  Laterality: Left;  . CYSTOSCOPY/RETROGRADE/URETEROSCOPY/STONE EXTRACTION WITH BASKET  X2 2004 & 2009   LEFT   . GASTRIC BYPASS  1981  . KNEE ARTHROSCOPY  AUG 2012   LEFT  . LAPAROSCOPIC CHOLECYSTECTOMY  2001  . left thumb joint surgery  2013  . PERCUTANEOUS NEPHROSTOLITHOTOMY  02-27-11   LEFT  . POLYPECTOMY    . RIGHT THUMB JOINT SURG.   MAY 2011  . TRIGGER FINGER RELEASE  2010   RIGHT THUMB  . UPPER GASTROINTESTINAL ENDOSCOPY    . URETERAL STENT PLACEMENT  X2  2004  &  2009   LEFT  . URETEROSCOPY  04/06/2011   Procedure: URETEROSCOPY;  Surgeon: Claybon Jabs, MD;  Location: [facility];  Service: Urology;  Laterality: Left;  L Ureteroscopy Laser  Litho & Stent     Current Outpatient Medications  Medication Sig Dispense Refill  . aspirin EC 81 MG tablet Take 81 mg by mouth daily.    Marland Kitchen atorvastatin (LIPITOR) 20 MG tablet TAKE 1 TABLET BY MOUTH  DAILY 90 tablet 1  . celecoxib (CELEBREX) 200 MG capsule Take 200 mg by mouth every morning.    . clonazePAM (KLONOPIN) 1 MG tablet TAKE 1 TABLET BY MOUTH  NIGHTLY AT BEDTIME FOR  RESTLESS LEG 90 tablet 1  . clotrimazole-betamethasone (LOTRISONE) cream Apply 1 application topically 2 (two) times daily. 45 g 0  . diclofenac Sodium (VOLTAREN) 1 % GEL Apply 2 g topically 4 (four) times daily. 350 g 1  . diphenoxylate-atropine (LOMOTIL) 2.5-0.025 MG per tablet Take 1 tablet by mouth 4 (four) times daily as needed for diarrhea or loose stools. 30 tablet 0  . fenofibrate 160 MG tablet TAKE 1 TABLET BY MOUTH  DAILY 90 tablet 3  . furosemide (LASIX) 20 MG tablet Take 1 tablet (20 mg total) by mouth daily. 90 tablet 3  . gabapentin (NEURONTIN) 300 MG capsule TAKE 1 CAPSULE BY MOUTH  TWICE DAILY 180 capsule 3  . hydrocortisone valerate cream (WESTCORT) 0.2 % Apply 1 application topically as needed (for skin irritation).    . Insulin Glargine (LANTUS SOLOSTAR) 100 UNIT/ML Solostar Pen Inject 70 Units into the skin daily.    . insulin lispro (HUMALOG) 100 UNIT/ML injection Inject 30 Units into the skin 3 (three) times daily before meals.    . Insulin Pen Needle (B-D ULTRAFINE III SHORT PEN) 31G X 8 MM MISC USE 4 TIMES DAILY 360 each 1  . Lancets (ONETOUCH DELICA PLUS 123XX123) MISC USE 4 TIMES DAILY 400 each 1  . lansoprazole (PREVACID) 15 MG capsule Take 15 mg by mouth daily.    Marland Kitchen levothyroxine (SYNTHROID) 50 MCG tablet TAKE 1 TABLET BY MOUTH  DAILY BEFORE BREAKFAST 90 tablet 3  . ondansetron (ZOFRAN) 4 MG tablet Take 1 tablet (4 mg total) by mouth every 8 (eight) hours as needed for nausea or vomiting. 12 tablet 0  . ONETOUCH VERIO test strip TEST 4 TIMES DAILY 400 each 6  . traZODone (DESYREL) 100 MG  tablet TAKE 1 TABLET BY MOUTH AT  BEDTIME 90 tablet 3  . valsartan (DIOVAN) 320 MG tablet TAKE 1 TABLET BY MOUTH  DAILY 90 tablet 3  . verapamil (CALAN-SR) 180 MG CR tablet Take 1 tablet (180 mg total) by mouth daily. 30 tablet 2   No current facility-administered medications for this visit.    Allergies:   Patient has no known allergies.  Social History:  The patient  reports that she has been smoking cigarettes. She has a 10.75 pack-year smoking history. She has never used smokeless tobacco. She reports that she does not drink alcohol or use drugs.   Family History:  The patient's   family history includes Breast cancer in an other family member; Colon cancer (age of onset: 22) in an other family member; Colon polyps in her sister and sister; Diabetes in an other family member; Heart attack in her father; Heart disease in her sister and another family member; Hyperlipidemia in her mother; Hypertension in her mother, sister, sister, sister, and sister; Lung cancer (age of onset: 44) in her sister.   ROS:  Please see the history of present illness.   All other systems are personally reviewed and negative.    Exam:    Vital Signs:  BP (!) 146/63   Pulse 74   Ht 5\' 2"  (1.575 m)   Wt 227 lb (103 kg)   LMP 06/06/1993   BMI 41.52 kg/m        Labs/Other Tests and Data Reviewed:    Recent Labs: 04/20/2019: ALT 14; BUN 22; Creatinine, Ser 0.96; Hemoglobin 14.3; Platelets 169.0; Potassium 4.1; Sodium 140; TSH 2.50   Wt Readings from Last 3 Encounters:  05/18/19 227 lb (103 kg)  04/17/19 226 lb (102.5 kg)  04/03/19 229 lb 3.2 oz (104 kg)     Other studies personally reviewed: Additional studies/ records that were reviewed today include    ASSESSMENT & PLAN:    Atrial tachycardia     HFpEF  Hypertension  Morbidly obese  Diarrhea  Smoking  We have discussed the physiology of heart failure including the importance of salt restriction and fluid restriction and have  reviewed sources of dietary salt and water.  Diarrhea better off metoprolol,    Irregular heart beat could be anything, not assoc with palpitations, so will have come in for ECG    COVID 19 screen The patient denies symptoms of COVID 19 at this time.  The importance of social distancing was discussed today.  Follow-up:  23m    Current medicines are reviewed at length with the patient today.   The patient does not have concerns regarding her medicines.  The following changes were made today:  none  Labs/ tests ordered today include:  CBC and ECG  No orders of the defined types were placed in this encounter.   Future tests ( post COVID )  ECG in 2 weeks   Patient Risk:  after full review of this patients clinical status, I feel that they are at moderat risk at this time.  Today, I have spent 9  minutes with the patient with telehealth technology discussing the above.  Signed, Virl Axe, MD  05/18/2019 4:51 PM     [place] [address] [phone] (office) [phone] (fax)

## 2019-05-18 NOTE — Patient Instructions (Signed)
Per Dr Olin Pia order pt has been scheduled for a nurse visit for EKG on Monday, January 18,2021 at 11am.  Pt made aware of appointment and instructed to arrive 15 min early.  Pt verbalizes understanding and agrees with plan.

## 2019-05-22 ENCOUNTER — Other Ambulatory Visit: Payer: Self-pay

## 2019-05-22 ENCOUNTER — Ambulatory Visit (INDEPENDENT_AMBULATORY_CARE_PROVIDER_SITE_OTHER): Payer: Medicare PPO

## 2019-05-22 VITALS — BP 148/63 | HR 81 | Ht 62.0 in | Wt 230.0 lb

## 2019-05-22 DIAGNOSIS — I471 Supraventricular tachycardia: Secondary | ICD-10-CM

## 2019-05-22 NOTE — Progress Notes (Signed)
Reason for visit: EKG request  1.) Name of MD requesting visit: Dr Caryl Comes  2.) H&P: Pt had telehealth visit with Dr Caryl Comes 05/18/2019 and requested pt come in for EKG d/t possible irregular heart beat according to pt's B/P machine and occasional palpitations.    ROS related to problem: Pt states she continues to have intermittent CP with latest episode this am.  Pt states CP resolved on it's own.  Denies current CP or SOB.  4.)  Assessment and plan per MD: EKG taken to Dr Harrington Challenger, DOD.  Pt advised to continue f/u with Dr Caryl Comes.  Pt verbalizes understanding and agrees with current plan.  EKG placed to be scanned into pt's medical record for Dr Olin Pia review.

## 2019-05-30 ENCOUNTER — Telehealth: Payer: Medicare Other | Admitting: Internal Medicine

## 2019-06-13 ENCOUNTER — Other Ambulatory Visit: Payer: Self-pay | Admitting: Family Medicine

## 2019-06-15 DIAGNOSIS — N200 Calculus of kidney: Secondary | ICD-10-CM | POA: Diagnosis not present

## 2019-06-15 DIAGNOSIS — N3020 Other chronic cystitis without hematuria: Secondary | ICD-10-CM | POA: Diagnosis not present

## 2019-06-15 DIAGNOSIS — R8279 Other abnormal findings on microbiological examination of urine: Secondary | ICD-10-CM | POA: Diagnosis not present

## 2019-06-26 ENCOUNTER — Other Ambulatory Visit: Payer: Self-pay

## 2019-06-26 MED ORDER — VERAPAMIL HCL ER 180 MG PO TBCR: 180.0000 mg | EXTENDED_RELEASE_TABLET | Freq: Every day | ORAL | 3 refills | Status: DC

## 2019-06-29 ENCOUNTER — Telehealth: Payer: Self-pay

## 2019-06-29 MED ORDER — VERAPAMIL HCL ER 180 MG PO TBCR: 180.0000 mg | EXTENDED_RELEASE_TABLET | Freq: Every day | ORAL | 3 refills | Status: DC

## 2019-06-29 NOTE — Telephone Encounter (Signed)
Pt called and wanted her verapamil sent to [facility], called and cancelled Rx at local pharmacy and resent to [facility] mail delivery. [place],CMA

## 2019-07-10 ENCOUNTER — Other Ambulatory Visit: Payer: Self-pay | Admitting: General Practice

## 2019-07-10 ENCOUNTER — Encounter: Payer: Self-pay | Admitting: Family Medicine

## 2019-07-10 MED ORDER — VALSARTAN 320 MG PO TABS: 320.0000 mg | ORAL_TABLET | Freq: Every day | ORAL | 0 refills | Status: DC

## 2019-07-10 MED ORDER — VALSARTAN 320 MG PO TABS: 320.0000 mg | ORAL_TABLET | Freq: Every day | ORAL | 1 refills | Status: DC

## 2019-07-17 DIAGNOSIS — G603 Idiopathic progressive neuropathy: Secondary | ICD-10-CM | POA: Diagnosis not present

## 2019-07-17 DIAGNOSIS — M21962 Unspecified acquired deformity of left lower leg: Secondary | ICD-10-CM | POA: Diagnosis not present

## 2019-07-17 DIAGNOSIS — M21961 Unspecified acquired deformity of right lower leg: Secondary | ICD-10-CM | POA: Diagnosis not present

## 2019-07-17 DIAGNOSIS — M65871 Other synovitis and tenosynovitis, right ankle and foot: Secondary | ICD-10-CM | POA: Diagnosis not present

## 2019-07-25 ENCOUNTER — Ambulatory Visit: Payer: Medicare PPO | Admitting: Family Medicine

## 2019-07-25 ENCOUNTER — Encounter: Payer: Self-pay | Admitting: Family Medicine

## 2019-07-25 ENCOUNTER — Other Ambulatory Visit: Payer: Self-pay

## 2019-07-25 VITALS — BP 139/79 | HR 76 | Temp 97.9°F | Resp 16 | Ht 62.0 in | Wt 227.1 lb

## 2019-07-25 DIAGNOSIS — E113391 Type 2 diabetes mellitus with moderate nonproliferative diabetic retinopathy without macular edema, right eye: Secondary | ICD-10-CM

## 2019-07-25 DIAGNOSIS — Z794 Long term (current) use of insulin: Secondary | ICD-10-CM | POA: Diagnosis not present

## 2019-07-25 DIAGNOSIS — M5410 Radiculopathy, site unspecified: Secondary | ICD-10-CM | POA: Diagnosis not present

## 2019-07-25 LAB — BASIC METABOLIC PANEL
BUN: 19 mg/dL (ref 6–23)
CO2: 28 mEq/L (ref 19–32)
Calcium: 9.1 mg/dL (ref 8.4–10.5)
Chloride: 103 mEq/L (ref 96–112)
Creatinine, Ser: 0.91 mg/dL (ref 0.40–1.20)
GFR: 61.26 mL/min (ref >60.00)
Glucose, Bld: 161 mg/dL — ABNORMAL HIGH (ref 70–99)
Potassium: 4.6 mEq/L (ref 3.5–5.1)
Sodium: 137 mEq/L (ref 135–145)

## 2019-07-25 LAB — HEMOGLOBIN A1C: Hgb A1c MFr Bld: 7 % — ABNORMAL HIGH (ref 4.6–6.5)

## 2019-07-25 MED ORDER — PREGABALIN 50 MG PO CAPS: 50.0000 mg | ORAL_CAPSULE | Freq: Two times a day (BID) | ORAL | 3 refills | Status: DC

## 2019-07-25 MED ORDER — CLONAZEPAM 1 MG PO TABS: 1.0000 mg | ORAL_TABLET | Freq: Every day | ORAL | 1 refills | Status: DC

## 2019-07-25 NOTE — Assessment & Plan Note (Signed)
Ongoing issue.  She is now riding her stationary bike regularly and attempting to follow her low carb diet.  Applauded her efforts.  Will continue to follow.

## 2019-07-25 NOTE — Assessment & Plan Note (Signed)
Chronic problem.  Pt reports CBGs remain labile despite 'working hard' on diet and exercise.  UTD on foot exam, eye exam.  On ARB for renal protection.  Check labs.  Adjust meds prn

## 2019-07-25 NOTE — Patient Instructions (Signed)
Schedule your complete physical and Medicare Wellness Visit in 3-4 months We'll notify you of your lab results and make any changes if needed START the Lyrica twice daily for the pain We'll call you with your Neurosurgery referral Continue to work on healthy diet and regular exercise- you're doing great! Call with any questions or concerns Stay Safe!  Stay Healthy!

## 2019-07-25 NOTE — Assessment & Plan Note (Signed)
Ongoing issue for pt.  Will start low dose Lyrica (pt has had weight gain in the past w/ gabapentin and lyrica at higher doses) and refer back to Neurosurgery.  Pt expressed understanding and is in agreement w/ plan.

## 2019-07-25 NOTE — Progress Notes (Signed)
   Subjective:    Patient ID: Lacey [name], female    DOB: 12-06-1950, 69 y.o.   MRN: [id number]  HPI DM- chronic problem, on Lantus 70 units nightly and Humalog 30 units TID.  On ARB for renal protection.  UTD on eye exam, foot exam.  Weight is stable.  Pt is riding stationary bike regularly.  Denies symptomatic lows.  + neuropathy.  CBG today 104 but readings can be very labile.  Denies CP or SOB above baseline.  + HAs.  No abd pain, N/V.  Chronic LBP- 'it hurts all the time'  'it goes all the way across'.  Radiates up to shoulder blades and into buttocks and down into legs.  Saw podiatry who felt that her neuropathy is coming from back rather than her feet.  Saw Dr Arnoldo Morale (Neurosurg) previously for cervical spine issues.   Review of Systems For ROS see HPI   This visit occurred during the SARS-CoV-2 public health emergency.  Safety protocols were in place, including screening questions prior to the visit, additional usage of staff PPE, and extensive cleaning of exam room while observing appropriate contact time as indicated for disinfecting solutions.       Objective:   Physical Exam Vitals reviewed.  Constitutional:      General: She is not in acute distress.    Appearance: She is well-developed. She is obese.  HENT:     Head: Normocephalic and atraumatic.  Eyes:     Conjunctiva/sclera: Conjunctivae normal.     Pupils: Pupils are equal, round, and reactive to light.  Neck:     Thyroid: No thyromegaly.  Cardiovascular:     Rate and Rhythm: Normal rate and regular rhythm.     Heart sounds: Normal heart sounds. No murmur.  Pulmonary:     Effort: Pulmonary effort is normal. No respiratory distress.     Breath sounds: Normal breath sounds.  Abdominal:     General: There is no distension.     Palpations: Abdomen is soft.     Tenderness: There is no abdominal tenderness.  Musculoskeletal:     Cervical back: Normal range of motion and neck supple.  Lymphadenopathy:     Cervical:  No cervical adenopathy.  Skin:    General: Skin is warm and dry.  Neurological:     Mental Status: She is alert and oriented to person, place, and time.     Comments: + SLR bilaterally  Psychiatric:        Behavior: Behavior normal.           Assessment & Plan:

## 2019-07-26 ENCOUNTER — Encounter: Payer: Self-pay | Admitting: General Practice

## 2019-08-01 ENCOUNTER — Other Ambulatory Visit: Payer: Self-pay | Admitting: Family Medicine

## 2019-08-08 ENCOUNTER — Other Ambulatory Visit: Payer: Self-pay | Admitting: Family Medicine

## 2019-08-09 ENCOUNTER — Other Ambulatory Visit: Payer: Self-pay | Admitting: General Practice

## 2019-08-09 MED ORDER — BD SWAB SINGLE USE REGULAR PADS: 1.0000 | MEDICATED_PAD | 3 refills | Status: DC | PRN

## 2019-08-10 DIAGNOSIS — M545 Low back pain: Secondary | ICD-10-CM | POA: Diagnosis not present

## 2019-08-10 DIAGNOSIS — I10 Essential (primary) hypertension: Secondary | ICD-10-CM | POA: Insufficient documentation

## 2019-08-10 DIAGNOSIS — Z6841 Body Mass Index (BMI) 40.0 and over, adult: Secondary | ICD-10-CM | POA: Diagnosis not present

## 2019-08-10 DIAGNOSIS — M5416 Radiculopathy, lumbar region: Secondary | ICD-10-CM | POA: Diagnosis not present

## 2019-08-14 ENCOUNTER — Other Ambulatory Visit: Payer: Self-pay | Admitting: General Practice

## 2019-08-14 MED ORDER — TRUE METRIX AIR GLUCOSE METER DEVI: 1.0000 | Freq: Four times a day (QID) | 3 refills | Status: DC

## 2019-08-14 MED ORDER — TRUE METRIX BLOOD GLUCOSE TEST VI STRP: ORAL_STRIP | 3 refills | Status: DC

## 2019-08-14 MED ORDER — LANCETS SUPER THIN 28G MISC: 3 refills | Status: DC

## 2019-08-22 DIAGNOSIS — M48061 Spinal stenosis, lumbar region without neurogenic claudication: Secondary | ICD-10-CM | POA: Diagnosis not present

## 2019-08-22 DIAGNOSIS — M5127 Other intervertebral disc displacement, lumbosacral region: Secondary | ICD-10-CM | POA: Diagnosis not present

## 2019-08-22 DIAGNOSIS — M5416 Radiculopathy, lumbar region: Secondary | ICD-10-CM | POA: Diagnosis not present

## 2019-08-22 DIAGNOSIS — M47816 Spondylosis without myelopathy or radiculopathy, lumbar region: Secondary | ICD-10-CM | POA: Diagnosis not present

## 2019-08-30 DIAGNOSIS — M21962 Unspecified acquired deformity of left lower leg: Secondary | ICD-10-CM | POA: Diagnosis not present

## 2019-08-30 DIAGNOSIS — M792 Neuralgia and neuritis, unspecified: Secondary | ICD-10-CM | POA: Diagnosis not present

## 2019-08-30 DIAGNOSIS — M21612 Bunion of left foot: Secondary | ICD-10-CM | POA: Diagnosis not present

## 2019-08-30 DIAGNOSIS — G603 Idiopathic progressive neuropathy: Secondary | ICD-10-CM | POA: Diagnosis not present

## 2019-09-15 ENCOUNTER — Other Ambulatory Visit: Payer: Self-pay | Admitting: Family Medicine

## 2019-09-19 ENCOUNTER — Other Ambulatory Visit: Payer: Self-pay | Admitting: Family Medicine

## 2019-10-16 ENCOUNTER — Other Ambulatory Visit: Payer: Self-pay

## 2019-10-16 ENCOUNTER — Ambulatory Visit (INDEPENDENT_AMBULATORY_CARE_PROVIDER_SITE_OTHER)
Admission: RE | Admit: 2019-10-16 | Discharge: 2019-10-16 | Disposition: A | Discharge status: Home / Self Care | Payer: Medicare PPO | Source: Ambulatory Visit | Attending: Acute Care | Admitting: Acute Care

## 2019-10-16 DIAGNOSIS — Z87891 Personal history of nicotine dependence: Secondary | ICD-10-CM | POA: Diagnosis not present

## 2019-10-16 DIAGNOSIS — F17210 Nicotine dependence, cigarettes, uncomplicated: Secondary | ICD-10-CM

## 2019-10-16 DIAGNOSIS — Z122 Encounter for screening for malignant neoplasm of respiratory organs: Secondary | ICD-10-CM

## 2019-10-17 ENCOUNTER — Other Ambulatory Visit: Payer: Self-pay | Admitting: *Deleted

## 2019-10-17 DIAGNOSIS — F17210 Nicotine dependence, cigarettes, uncomplicated: Secondary | ICD-10-CM

## 2019-10-17 DIAGNOSIS — Z87891 Personal history of nicotine dependence: Secondary | ICD-10-CM

## 2019-10-17 NOTE — Progress Notes (Signed)
Please call patient and let them  know their  low dose Ct was read as a Lung RADS 2: nodules that are benign in appearance and behavior with a very low likelihood of becoming a clinically active cancer due to size or lack of growth. Recommendation per radiology is for a repeat LDCT in 12 months. .Please let them  know we will order and schedule their  annual screening scan for 10/2020. Please let them  know there was notation of CAD on their  scan.  Please remind the patient  that this is a non-gated exam therefore degree or severity of disease  cannot be determined. Please have them  follow up with their PCP regarding potential risk factor modification, dietary therapy or pharmacologic therapy if clinically indicated. Pt.  is  currently on statin therapy. Please place order for annual  screening scan for 10/2020 and fax results to PCP. Thanks so much. 

## 2019-10-17 NOTE — Progress Notes (Signed)
Please let patient know there was notation of Hepatic steatosis. This  is a term that describes the build up of fat in the liver. It is normal to have small amounts of fat in your liver, but when the proportion of liver cells that contain fat exceeds more than 5% it is indicative of early stage fatty liver.Treatment often involves reducing risk factors through a diet and exercise plan. It is generally a benign condition, but in a small percentage of patients it does require follow up. Please have the patient follow up with PCP regarding potential risk factor modification, dietary therapy or pharmacologic therapy if clinically indicated.

## 2019-10-19 NOTE — Progress Notes (Signed)
Please call patient and let them  know their  low dose Ct was read as a Lung RADS 2: nodules that are benign in appearance and behavior with a very low likelihood of becoming a clinically active cancer due to size or lack of growth. Recommendation per radiology is for a repeat LDCT in 12 months. .Please let them  know we will order and schedule their  annual screening scan for 10/2020. Please let them  know there was notation of CAD on their  scan.  Please remind the patient  that this is a non-gated exam therefore degree or severity of disease  cannot be determined. Please have them  follow up with their PCP regarding potential risk factor modification, dietary therapy or pharmacologic therapy if clinically indicated. Pt.  is  currently on statin therapy. Please place order for annual  screening scan for  10/2020 and fax results to PCP. Thanks so much.  Please let patient know there was notation of Hepatic steatosis. This  is a term that describes the build up of fat in the liver. It is normal to have small amounts of fat in your liver, but when the proportion of liver cells that contain fat exceeds more than 5% it is indicative of early stage fatty liver.Treatment often involves reducing risk factors through a diet and exercise plan. It is generally a benign condition, but in a small percentage of patients it does require follow up. Please have the patient follow up with PCP regarding potential risk factor modification, dietary therapy or pharmacologic therapy if clinically indicated.

## 2019-10-20 ENCOUNTER — Other Ambulatory Visit: Payer: Self-pay | Admitting: *Deleted

## 2019-10-20 ENCOUNTER — Encounter: Payer: Medicare Other | Admitting: Family Medicine

## 2019-10-24 ENCOUNTER — Other Ambulatory Visit: Payer: Self-pay | Admitting: Family Medicine

## 2019-10-24 ENCOUNTER — Encounter: Payer: Self-pay | Admitting: Family Medicine

## 2019-10-24 MED ORDER — LEVOTHYROXINE SODIUM 50 MCG PO TABS: ORAL_TABLET | ORAL | 1 refills | Status: DC

## 2019-10-24 MED ORDER — ATORVASTATIN CALCIUM 20 MG PO TABS: 20.0000 mg | ORAL_TABLET | Freq: Every day | ORAL | 1 refills | Status: DC

## 2019-11-13 ENCOUNTER — Ambulatory Visit (INDEPENDENT_AMBULATORY_CARE_PROVIDER_SITE_OTHER): Payer: Medicare PPO

## 2019-11-13 VITALS — Ht 62.0 in | Wt 227.0 lb

## 2019-11-13 DIAGNOSIS — Z0000 Encounter for general adult medical examination without abnormal findings: Secondary | ICD-10-CM | POA: Diagnosis not present

## 2019-11-13 NOTE — Patient Instructions (Signed)
Lacey [name] , Thank you for taking time to come for your Medicare Wellness Visit. I appreciate your ongoing commitment to your health goals. Please review the following plan we discussed and let me know if I can assist you in the future.   Screening recommendations/referrals: Colonoscopy: Completed 09/20/2015 Due  09/19/2020 Mammogram: Completed 12/27/2018 Due 12/27/2019 Bone Density: Completed 06/14/2017-Discuss with [facility] to see if they can perform at the same time as mammogram in August. Recommended yearly ophthalmology/optometry visit for glaucoma screening and checkup Recommended yearly dental visit for hygiene and checkup  Vaccinations: Influenza vaccine: Due 01/2020 Pneumococcal vaccine: Completed vaccines Tdap vaccine: Up to date Due 01/24/2029 Shingles vaccine: Completed vaccine   Covid-19:Completed vaccines  Advanced directives: Bring a copy to next office visit  Conditions/risks identified: see problem list  Next appointment: Follow up in one year for your annual wellness visit    Preventive Care 69 Years and Older, Female Preventive care refers to lifestyle choices and visits with your health care provider that can promote health and wellness. What does preventive care include?  A yearly physical exam. This is also called an annual well check.  Dental exams once or twice a year.  Routine eye exams. Ask your health care provider how often you should have your eyes checked.  Personal lifestyle choices, including:  Daily care of your teeth and gums.  Regular physical activity.  Eating a healthy diet.  Avoiding tobacco and drug use.  Limiting alcohol use.  Practicing safe sex.  Taking low-dose aspirin every day.  Taking vitamin and mineral supplements as recommended by your health care provider. What happens during an annual well check? The services and screenings done by your health care provider during your annual well check will depend on your age, overall  health, lifestyle risk factors, and family history of disease. Counseling  Your health care provider may ask you questions about your:  Alcohol use.  Tobacco use.  Drug use.  Emotional well-being.  Home and relationship well-being.  Sexual activity.  Eating habits.  History of falls.  Memory and ability to understand (cognition).  Work and work Statistician.  Reproductive health. Screening  You may have the following tests or measurements:  Height, weight, and BMI.  Blood pressure.  Lipid and cholesterol levels. These may be checked every 5 years, or more frequently if you are over 62 years old.  Skin check.  Lung cancer screening. You may have this screening every year starting at age 69 if you have a 30-pack-year history of smoking and currently smoke or have quit within the past 15 years.  Fecal occult blood test (FOBT) of the stool. You may have this test every year starting at age 69.  Flexible sigmoidoscopy or colonoscopy. You may have a sigmoidoscopy every 5 years or a colonoscopy every 10 years starting at age 69.  Hepatitis C blood test.  Hepatitis B blood test.  Sexually transmitted disease (STD) testing.  Diabetes screening. This is done by checking your blood sugar (glucose) after you have not eaten for a while (fasting). You may have this done every 1-3 years.  Bone density scan. This is done to screen for osteoporosis. You may have this done starting at age 69.  Mammogram. This may be done every 1-2 years. Talk to your health care provider about how often you should have regular mammograms. Talk with your health care provider about your test results, treatment options, and if necessary, the need for more tests. Vaccines  Your health  care provider may recommend certain vaccines, such as:  Influenza vaccine. This is recommended every year.  Tetanus, diphtheria, and acellular pertussis (Tdap, Td) vaccine. You may need a Td booster every 10  years.  Zoster vaccine. You may need this after age 64.  Pneumococcal 13-valent conjugate (PCV13) vaccine. One dose is recommended after age 69.  Pneumococcal polysaccharide (PPSV23) vaccine. One dose is recommended after age 69. Talk to your health care provider about which screenings and vaccines you need and how often you need them. This information is not intended to replace advice given to you by your health care provider. Make sure you discuss any questions you have with your health care provider. Document Released: 05/17/2015 Document Revised: 01/08/2016 Document Reviewed: 02/19/2015 Elsevier Interactive Patient Education  2017 [place] Prevention in the Home Falls can cause injuries. They can happen to people of all ages. There are many things you can do to make your home safe and to help prevent falls. What can I do on the outside of my home?  Regularly fix the edges of walkways and driveways and fix any cracks.  Remove anything that might make you trip as you walk through a door, such as a raised step or threshold.  Trim any bushes or trees on the path to your home.  Use bright outdoor lighting.  Clear any walking paths of anything that might make someone trip, such as rocks or tools.  Regularly check to see if handrails are loose or broken. Make sure that both sides of any steps have handrails.  Any raised decks and porches should have guardrails on the edges.  Have any leaves, snow, or ice cleared regularly.  Use sand or salt on walking paths during winter.  Clean up any spills in your garage right away. This includes oil or grease spills. What can I do in the bathroom?  Use night lights.  Install grab bars by the toilet and in the tub and shower. Do not use towel bars as grab bars.  Use non-skid mats or decals in the tub or shower.  If you need to sit down in the shower, use a plastic, non-slip stool.  Keep the floor dry. Clean up any water that  spills on the floor as soon as it happens.  Remove soap buildup in the tub or shower regularly.  Attach bath mats securely with double-sided non-slip rug tape.  Do not have throw rugs and other things on the floor that can make you trip. What can I do in the bedroom?  Use night lights.  Make sure that you have a light by your bed that is easy to reach.  Do not use any sheets or blankets that are too big for your bed. They should not hang down onto the floor.  Have a firm chair that has side arms. You can use this for support while you get dressed.  Do not have throw rugs and other things on the floor that can make you trip. What can I do in the kitchen?  Clean up any spills right away.  Avoid walking on wet floors.  Keep items that you use a lot in easy-to-reach places.  If you need to reach something above you, use a strong step stool that has a grab bar.  Keep electrical cords out of the way.  Do not use floor polish or wax that makes floors slippery. If you must use wax, use non-skid floor wax.  Do not have  throw rugs and other things on the floor that can make you trip. What can I do with my stairs?  Do not leave any items on the stairs.  Make sure that there are handrails on both sides of the stairs and use them. Fix handrails that are broken or loose. Make sure that handrails are as long as the stairways.  Check any carpeting to make sure that it is firmly attached to the stairs. Fix any carpet that is loose or worn.  Avoid having throw rugs at the top or bottom of the stairs. If you do have throw rugs, attach them to the floor with carpet tape.  Make sure that you have a light switch at the top of the stairs and the bottom of the stairs. If you do not have them, ask someone to add them for you. What else can I do to help prevent falls?  Wear shoes that:  Do not have high heels.  Have rubber bottoms.  Are comfortable and fit you well.  Are closed at the  toe. Do not wear sandals.  If you use a stepladder:  Make sure that it is fully opened. Do not climb a closed stepladder.  Make sure that both sides of the stepladder are locked into place.  Ask someone to hold it for you, if possible.  Clearly mark and make sure that you can see:  Any grab bars or handrails.  First and last steps.  Where the edge of each step is.  Use tools that help you move around (mobility aids) if they are needed. These include:  Canes.  Walkers.  Scooters.  Crutches.  Turn on the lights when you go into a dark area. Replace any light bulbs as soon as they burn out.  Set up your furniture so you have a clear path. Avoid moving your furniture around.  If any of your floors are uneven, fix them.  If there are any pets around you, be aware of where they are.  Review your medicines with your doctor. Some medicines can make you feel dizzy. This can increase your chance of falling. Ask your doctor what other things that you can do to help prevent falls. This information is not intended to replace advice given to you by your health care provider. Make sure you discuss any questions you have with your health care provider. Document Released: 02/14/2009 Document Revised: 09/26/2015 Document Reviewed: 05/25/2014 Elsevier Interactive Patient Education  2017 [employer].

## 2019-11-13 NOTE — Progress Notes (Addendum)
Subjective:   Lacey [name] is a 69 y.o. female who presents for an Initial Medicare Annual Wellness Visit.  I connected with Ameia today by telephone and verified that I am speaking with the correct person using two identifiers. Location patient: home Location provider: work Persons participating in the virtual visit: patient, Marine scientist.    I discussed the limitations, risks, security and privacy concerns of performing an evaluation and management service by telephone and the availability of in person appointments. I also discussed with the patient that there may be a patient responsible charge related to this service. The patient expressed understanding and verbally consented to this telephonic visit.    Interactive audio and video telecommunications were attempted between this provider and patient, however failed, due to patient having technical difficulties OR patient did not have access to video capability.  We continued and completed visit with audio only.  Some vital signs may be absent or patient reported.   Time Spent with patient on telephone encounter: 35 minutes  Review of Systems      Cardiac Risk Factors include: advanced age (>46men, >60 women);diabetes mellitus;dyslipidemia;hypertension;obesity (BMI >30kg/m2);sedentary lifestyle;smoking/ tobacco exposure     Objective:    Today's Vitals   11/13/19 0859 11/13/19 0901  Weight: 227 lb (103 kg)   Height: 5\' 2"  (1.575 m)   PainSc:  4    Body mass index is 41.52 kg/m.  Advanced Directives 11/13/2019 06/16/2018 06/09/2017 07/08/2016 09/06/2015 10/19/2014 11/25/2012  Does Patient Have a Medical Advance Directive? Yes Yes Yes No No No Patient does not have advance directive;Patient would not like information  Type of Scientist, forensic Power of [place];Living will [place];Living will Living will;Healthcare Power of Attorney - - - -  Copy of [place] in Chart? No - copy requested No -  copy requested No - copy requested - - - -  Would patient like information on creating a medical advance directive? - - - No - Patient declined - No - patient declined information -  Pre-existing out of facility DNR order (yellow form or pink MOST form) - - - - - - No    Current Medications (verified) Outpatient Encounter Medications as of 11/13/2019  Medication Sig  . Alcohol Swabs (B-D SINGLE USE SWABS REGULAR) PADS 1 each by Does not apply route as needed.  Marland Kitchen aspirin EC 81 MG tablet Take 81 mg by mouth daily.  Marland Kitchen atorvastatin (LIPITOR) 20 MG tablet Take 1 tablet (20 mg total) by mouth daily.  . Blood Glucose Monitoring Suppl (TRUE METRIX AIR GLUCOSE METER) DEVI 1 each by Does not apply route 4 (four) times daily. Dx. E11.9  . clonazePAM (KLONOPIN) 1 MG tablet Take 1 tablet (1 mg total) by mouth at bedtime. For RLS  . clotrimazole-betamethasone (LOTRISONE) cream Apply 1 application topically 2 (two) times daily.  . diclofenac Sodium (VOLTAREN) 1 % GEL APPLY 2 GRAMS TO AFFECTED AREA 4 TIMES A DAY  . diphenoxylate-atropine (LOMOTIL) 2.5-0.025 MG per tablet Take 1 tablet by mouth 4 (four) times daily as needed for diarrhea or loose stools.  . DROPLET PEN NEEDLES 31G X 8 MM MISC USE 4 TIMES DAILY  . fenofibrate 160 MG tablet TAKE 1 TABLET BY MOUTH  DAILY  . furosemide (LASIX) 20 MG tablet TAKE 1 TABLET BY MOUTH  DAILY  . gabapentin (NEURONTIN) 300 MG capsule TAKE 1 CAPSULE BY MOUTH THREE TIMES A DAY  . glucose blood (TRUE METRIX BLOOD GLUCOSE TEST) test strip  Use as instructed to test blood sugar 4 times daily. Dx. E11.9  . Insulin Glargine (LANTUS SOLOSTAR) 100 UNIT/ML Solostar Pen Inject 70 Units into the skin daily.  . insulin lispro (HUMALOG) 100 UNIT/ML injection Inject 30 Units into the skin 3 (three) times daily before meals.  . Lancets Super Thin 28G MISC Please use as directed to test sugars 4 times daily. Dx. E11.9  . lansoprazole (PREVACID) 15 MG capsule Take 15 mg by mouth daily.  Marland Kitchen  levothyroxine (SYNTHROID) 50 MCG tablet TAKE 1 TABLET BY MOUTH  DAILY BEFORE BREAKFAST  . nitrofurantoin, macrocrystal-monohydrate, (MACROBID) 100 MG capsule   . ondansetron (ZOFRAN) 4 MG tablet Take 1 tablet (4 mg total) by mouth every 8 (eight) hours as needed for nausea or vomiting.  . pregabalin (LYRICA) 50 MG capsule Take 1 capsule (50 mg total) by mouth 2 (two) times daily.  . traZODone (DESYREL) 100 MG tablet TAKE 1 TABLET BY MOUTH AT  BEDTIME  . valsartan (DIOVAN) 320 MG tablet TAKE 1 TABLET BY MOUTH EVERY DAY  . verapamil (CALAN-SR) 180 MG CR tablet Take 1 tablet (180 mg total) by mouth daily.  . hydrocortisone valerate cream (WESTCORT) 0.2 % Apply 1 application topically as needed (for skin irritation).   No facility-administered encounter medications on file as of 11/13/2019.    Allergies (verified) Patient has no known allergies.   History: Past Medical History:  Diagnosis Date  . Adenomatous colon polyp    hyperplastic  . Anginal pain ([place])    not recent  . Arthritis    NECK, KNEES, FINGERS, TOES  . Atrial tachycardia ([place]) CARDIOLOGIST - DR Caryl Comes (LAST VISIT AUG 2012)   Echo 12/11: EF 55-60%, Mild LVH, grade 1 diast dysfxn;   holter 1/12: ATach  . Atypical chest pain    a. 07/2004 Cath: Clean cors;  b. 04/2010 Myoview: EF 63%, no ischemia  . Blood transfusion without reported diagnosis 1969  . Chronic kidney disease   . Diabetes mellitus, type 2 (H[facility])    ORAL AND INSULIN MEDS (LAST A1C  7.3)  . Diverticulosis   . Erythema CURRENT-- CLOSED ABD. WALL ABSCESS   PER PCP NOTE (03-31-11)- MRSA-- TAKES DOXYCYCLINE  . GERD (gastroesophageal reflux disease)    CONTROLLED W/ PREVACID  . History of kidney stones 2012  . Hyperlipidemia   . Hypertension   . Insomnia    TAKES MEDS PRN  . Neuropathy, peripheral   . Obesity (BMI 30-39.9) 02/19/2015  . Pneumonia    as child  . Post op infection 11/07/12   left bunionectomy  . Restless leg syndrome   . Sepsis, urinary HISTORY  - 2004  . Sleep apnea   . Vitamin D deficiency    Past Surgical History:  Procedure Laterality Date  . ABDOMINAL HYSTERECTOMY  1995   ovaries remain  . BUNIONECTOMY Left 10/24/12  . BUNIONECTOMY Right 05/17/2017  . CARDIAC CATHETERIZATION  07/10/04  . CARPAL TUNNEL RELEASE  2000   RIGHT  . CERVICAL FUSION  10-12-07   C5 - 7  . COLONOSCOPY    . CYSTOSCOPY WITH RETROGRADE PYELOGRAM, URETEROSCOPY AND STENT PLACEMENT Left 11/28/2012   Procedure: LEFT RETROGRADE PYELOGRAM, LEFT URETEROSCOPY, ;  Surgeon: Claybon Jabs, MD;  Location: [facility];  Service: Urology;  Laterality: Left;  . CYSTOSCOPY/RETROGRADE/URETEROSCOPY/STONE EXTRACTION WITH BASKET  X2 2004 & 2009   LEFT   . GASTRIC BYPASS  1981  . KNEE ARTHROSCOPY  AUG 2012   LEFT  . LAPAROSCOPIC CHOLECYSTECTOMY  2001  . left thumb joint surgery  2013  . PERCUTANEOUS NEPHROSTOLITHOTOMY  02-27-11   LEFT  . POLYPECTOMY    . RIGHT THUMB JOINT SURG.   MAY 2011  . TRIGGER FINGER RELEASE  2010   RIGHT THUMB  . UPPER GASTROINTESTINAL ENDOSCOPY    . URETERAL STENT PLACEMENT  X2  2004  &  2009   LEFT  . URETEROSCOPY  04/06/2011   Procedure: URETEROSCOPY;  Surgeon: Claybon Jabs, MD;  [facility];  Service: Urology;  Laterality: Left;  L Ureteroscopy Laser Litho & Stent    Family History  Problem Relation Age of Onset  . Heart attack Father   . Hyperlipidemia Mother   . Hypertension Mother   . Heart disease Sister   . Hypertension Sister   . Colon polyps Sister   . Hypertension Sister   . Colon polyps Sister   . Hypertension Sister   . Hypertension Sister   . Lung cancer Sister 40       stage 4   . Diabetes Other   . Breast cancer Other   . Heart disease Other   . Colon cancer Other 19       nephew  . Esophageal cancer Neg Hx   . Rectal cancer Neg Hx   . Stomach cancer Neg Hx   . Amblyopia Neg Hx   . Blindness Neg Hx   . Cataracts Neg Hx   . Glaucoma Neg Hx   . Retinal detachment Neg Hx   .  Strabismus Neg Hx   . Retinitis pigmentosa Neg Hx    Social History   Socioeconomic History  . Marital status: Married    Spouse name: Not on file  . Number of children: 2  . Years of education: Not on file  . Highest education level: Not on file  Occupational History  . Occupation: Engineer, materials middle school    Employer: [employer]    Comment: in office  Tobacco Use  . Smoking status: Current Every Day Smoker    Packs/day: 0.25    Years: 43.00    Pack years: 10.75    Types: Cigarettes  . Smokeless tobacco: Never Used  . Tobacco comment: 0.75ppd x66yrs, 0.25ppd x26yr (07/12/15). Counseled to quit -smoking, nicotine replacement and smoking cessation classes discussed-3-4 a day as of 09-06-15  Substance and Sexual Activity  . Alcohol use: No    Alcohol/week: 0.0 standard drinks  . Drug use: No  . Sexual activity: Not on file  Other Topics Concern  . Not on file  Social History Narrative   2 children, 2 stepchildren   Social Determinants of Health   Financial Resource Strain: Low Risk   . Difficulty of Paying Living Expenses: Not hard at all  Food Insecurity: No Food Insecurity  . Worried About Charity fundraiser in the Last Year: Never true  . Ran Out of Food in the Last Year: Never true  Transportation Needs: No Transportation Needs  . Lack of Transportation (Medical): No  . Lack of Transportation (Non-Medical): No  Physical Activity: Inactive  . Days of Exercise per Week: 0 days  . Minutes of Exercise per Session: 0 min  Stress: No Stress Concern Present  . Feeling of Stress : Not at all  Social Connections:   . Frequency of Communication with Friends and Family:   . Frequency of Social Gatherings with Friends and Family:   . Attends Religious Services:   .  Active Member of Clubs or Organizations:   . Attends Archivist Meetings:   Marland Kitchen Marital Status:     Tobacco Counseling Ready to quit: Not Answered Counseling given: Not Answered Comment: 0.75ppd  x20yrs, 0.25ppd x57yr (07/12/15). Counseled to quit -smoking, nicotine replacement and smoking cessation classes discussed-3-4 a day as of 09-06-15   Clinical Intake:  Pre-visit preparation completed: Yes  Pain : 0-10 Pain Score: 4  Pain Type: Chronic pain Pain Location: Knee Pain Onset: More than a month ago Pain Frequency: Intermittent Pain Relieving Factors: none  Pain Relieving Factors: none  Nutritional Status: BMI > 30  Obese Nutritional Risks: None Diabetes: Yes CBG done?: Yes (67 fasting per patient) CBG resulted in Enter/ Edit results?: No Did pt. bring in CBG monitor from home?: No (virtual visit)  How often do you need to have someone help you when you read instructions, pamphlets, or other written materials from your doctor or pharmacy?: 1 - Never What is the last grade level you completed in school?: GED  Nutrition Risk Assessment:  Has the patient had any N/V/D within the last 2 months?  No  Does the patient have any non-healing wounds?  No  Has the patient had any unintentional weight loss or weight gain?  No   Diabetes:  Is the patient diabetic?  Yes  If diabetic, was a CBG obtained today?  Yes 67 fasting per patient Did the patient bring in their glucometer from home?  No virtual visit How often do you monitor your CBG's? daily.   Financial Strains and Diabetes Management:  Are you having any financial strains with the device, your supplies or your medication? No .  Does the patient want to be seen by Chronic Care Management for management of their diabetes?  No  Would the patient like to be referred to a Nutritionist or for Diabetic Management?  No   Diabetic Exams:  Diabetic Eye Exam: Completed 12/14/2018.   Diabetic Foot Exam: Pt has been advised about the importance in completing this exam. To be completed by PCP  Interpreter Needed?: No  Information entered by :: Caroleen Hamman LPN   Activities of Daily Living In your present state of health,  do you have any difficulty performing the following activities: 11/13/2019 07/25/2019  Hearing? N N  Vision? N N  Difficulty concentrating or making decisions? N N  Walking or climbing stairs? N N  Dressing or bathing? N N  Doing errands, shopping? N N  Preparing Food and eating ? N -  Using the Toilet? N -  In the past six months, have you accidently leaked urine? Y -  Comment wears a pad -  Do you have problems with loss of bowel control? Y -  Comment wears a pad -  Managing your Medications? N -  Managing your Finances? N -  Housekeeping or managing your Housekeeping? N -  Some recent data might be hidden     Immunizations and Health Maintenance Immunization History  Administered Date(s) Administered  . Influenza Split 02/05/2011, 02/26/2012  . Influenza Whole 02/05/2009, 01/20/2010  . Influenza, High Dose Seasonal PF 01/10/2018, 12/23/2018, 01/25/2019  . Influenza,inj,Quad PF,6+ Mos 01/24/2013, 01/15/2014, 01/22/2015, 01/23/2016, 12/29/2016  . PFIZER SARS-COV-2 Vaccination 05/29/2019, 06/19/2019  . Pneumococcal Conjugate-13 04/17/2014  . Pneumococcal Polysaccharide-23 06/05/2007, 02/06/2016  . Tdap 06/22/2011, 12/23/2018, 01/25/2019  . Zoster 03/24/2012  . Zoster Recombinat (Shingrix) 07/01/2017, 09/04/2017   Health Maintenance Due  Topic Date Due  . DEXA SCAN  06/15/2019  .  FOOT EXAM  10/18/2019    Patient Care Team: Midge Minium, MD as PCP - General Deboraha Sprang, MD as PCP - Cardiology (Cardiology) Deboraha Sprang, MD as Consulting Physician (Cardiology) Almedia Balls, MD as Consulting Physician (Orthopedic Surgery) Dyke Maes, [place] as Consulting Physician (Optometry) Irene Shipper, MD as Consulting Physician (Gastroenterology) Ceasar Mons, MD as Consulting Physician (Urology) Martinique, Amy, MD as Consulting Physician (Dermatology) Marchia Bond, MD as Consulting Physician (Orthopedic Surgery)  Indicate any recent Medical Services you  may have received from other than [facility] providers in the past year (date may be approximate).     Assessment:   This is a routine wellness examination for Pinkey.  Hearing/Vision screen  Hearing Screening   125Hz  250Hz  500Hz  1000Hz  2000Hz  3000Hz  4000Hz  6000Hz  8000Hz   Right ear:           Left ear:           Comments: No issues  Vision Screening Comments: [facility] eye exam 12/2018-Sees [facility]  Dietary issues and exercise activities discussed: Current Exercise Habits: The patient does not participate in regular exercise at present, Exercise limited by: orthopedic condition(s)  Goals Addressed            This Visit's Progress   . Patient Stated       Would like to lose weight      Depression Screen PHQ 2/9 Scores 11/13/2019 07/25/2019 04/17/2019 01/30/2019 10/18/2018 06/16/2018 02/21/2018  PHQ - 2 Score 0 0 0 0 0 0 0  PHQ- 9 Score - 0 0 0 0 - 0  Exception Documentation - - - - - - -    Fall Risk Fall Risk  11/13/2019 07/25/2019 04/17/2019 01/30/2019 10/18/2018  Falls in the past year? 0 0 0 0 0  Number falls in past yr: 0 0 0 0 0  Injury with Fall? 0 0 0 0 0  Follow up Falls prevention discussed Falls evaluation completed Falls evaluation completed - -    FALL RISK PREVENTION PERTAINING TO THE HOME:  Any stairs in or around the home? Yes  If so, are there any without handrails? No   Home free of loose throw rugs in walkways, pet beds, electrical cords, etc? Yes  Adequate lighting in your home to reduce risk of falls? Yes   ASSISTIVE DEVICES UTILIZED TO PREVENT FALLS:  Life alert? No  Use of a cane, walker or w/c? No  Grab bars in the bathroom? Yes  Shower chair or bench in shower? No  Elevated toilet seat or a handicapped toilet? No    TIMED UP AND GO:  Was the test performed? No . Virtual visit     Cognitive Function: MMSE - Mini Mental State Exam 06/16/2018 06/09/2017  Orientation to time 5 5  Orientation to Place 5 5  Registration 3 3  Attention/  Calculation 5 5  Recall 3 3  Language- name 2 objects 2 2  Language- repeat 1 1  Language- follow 3 step command 3 3  Language- read & follow direction 1 1  Write a sentence 1 1  Copy design 1 1  Total score 30 30        Screening Tests Health Maintenance  Topic Date Due  . DEXA SCAN  06/15/2019  . FOOT EXAM  10/18/2019  . INFLUENZA VACCINE  12/03/2019  . MAMMOGRAM  12/27/2019  . HEMOGLOBIN A1C  01/25/2020  . OPHTHALMOLOGY EXAM  07/24/2020  . COLONOSCOPY  09/19/2020  .  TETANUS/TDAP  01/24/2029  . COVID-19 Vaccine  Completed  . Hepatitis C Screening  Completed  . PNA vac Low Risk Adult  Completed    Qualifies for Shingles Vaccine? Completed vaccines  Tdap: Up to date  Flu Vaccine: Due 01/2020  Pneumococcal Vaccine: Completed vaccines  COVID-19 vaccine: Vaccines received  Cancer Screenings:  Colorectal Screening: Completed 09/20/2015. Repeat every 5 years.  Mammogram: Completed 12/27/2018. Repeat every year  Bone Density: Completed 06/14/2017. Results reflect NORMAL Repeat every 2 years. Patient has a mammogram scheduled with [facility] in August. She plans to call them to see if they can do her bone density the same day.  Lung Cancer Screening: (Low Dose CT Chest recommended if Age 16-80 years, 30 pack-year currently smoking OR have quit w/in 15years.) does qualify.  Low dose CT chest already ordered on 10/16/2019   Additional Screening:  Hepatitis C Screening:  Completed 02/06/2016   Dental Screening: Recommended annual dental exams for proper oral hygiene  Community Resource Referral:  CRR required this visit?  No       Plan:  I have personally reviewed and addressed the Medicare Annual Wellness questionnaire and have noted the following in the patient's chart:  A. Medical and social history B. Use of alcohol, tobacco or illicit drugs  C. Current medications and supplements D. Functional ability and status E.  Nutritional status F.  Physical  activity G. Advance directives H. List of other physicians I.  Hospitalizations, surgeries, and ER visits in previous 12 months J.  [place] such as hearing and vision if needed, cognitive and depression L. Referrals and appointments   In addition, I have reviewed and discussed with patient certain preventive protocols, quality metrics, and best practice recommendations. A written personalized care plan for preventive services as well as general preventive health recommendations were provided to patient.  Due to this being a telephonic visit, the after visit summary with patients personalized plan was offered to patient via mail or my-chart.  Patient would like to access on my-chart.   Signed,    Marta Antu, LPN   0/02/2724  Nurse Health Advisor    Nurse Notes: Patient says her blood sugar has been dropping at times. She has an appt scheduled with [name] on 11/15/19 & plans to discuss this at that time.  I reviewed and agree w/ above documentation provided by LPN.  Annye Asa, MD

## 2019-11-15 ENCOUNTER — Encounter: Payer: Self-pay | Admitting: Family Medicine

## 2019-11-15 ENCOUNTER — Ambulatory Visit: Payer: Medicare PPO

## 2019-11-15 ENCOUNTER — Ambulatory Visit (INDEPENDENT_AMBULATORY_CARE_PROVIDER_SITE_OTHER): Payer: Medicare PPO | Admitting: Family Medicine

## 2019-11-15 ENCOUNTER — Other Ambulatory Visit: Payer: Self-pay

## 2019-11-15 VITALS — BP 123/81 | HR 69 | Temp 97.9°F | Resp 16 | Ht 62.0 in | Wt 225.2 lb

## 2019-11-15 DIAGNOSIS — I110 Hypertensive heart disease with heart failure: Secondary | ICD-10-CM

## 2019-11-15 DIAGNOSIS — E559 Vitamin D deficiency, unspecified: Secondary | ICD-10-CM

## 2019-11-15 DIAGNOSIS — Z0000 Encounter for general adult medical examination without abnormal findings: Secondary | ICD-10-CM

## 2019-11-15 DIAGNOSIS — I471 Supraventricular tachycardia: Secondary | ICD-10-CM

## 2019-11-15 DIAGNOSIS — Z794 Long term (current) use of insulin: Secondary | ICD-10-CM | POA: Diagnosis not present

## 2019-11-15 DIAGNOSIS — E113391 Type 2 diabetes mellitus with moderate nonproliferative diabetic retinopathy without macular edema, right eye: Secondary | ICD-10-CM

## 2019-11-15 DIAGNOSIS — I4719 Other supraventricular tachycardia: Secondary | ICD-10-CM

## 2019-11-15 LAB — LIPID PANEL
Cholesterol: 137 mg/dL (ref 0–200)
HDL: 42.6 mg/dL (ref >39.00)
LDL Cholesterol: 60 mg/dL (ref 0–99)
NonHDL: 94.32
Total CHOL/HDL Ratio: 3
Triglycerides: 174 mg/dL — ABNORMAL HIGH (ref 0.0–149.0)
VLDL: 34.8 mg/dL (ref 0.0–40.0)

## 2019-11-15 LAB — BASIC METABOLIC PANEL
BUN: 22 mg/dL (ref 6–23)
CO2: 28 mEq/L (ref 19–32)
Calcium: 9.4 mg/dL (ref 8.4–10.5)
Chloride: 102 mEq/L (ref 96–112)
Creatinine, Ser: 1.13 mg/dL (ref 0.40–1.20)
GFR: 47.67 mL/min — ABNORMAL LOW (ref >60.00)
Glucose, Bld: 118 mg/dL — ABNORMAL HIGH (ref 70–99)
Potassium: 4.7 mEq/L (ref 3.5–5.1)
Sodium: 138 mEq/L (ref 135–145)

## 2019-11-15 LAB — VITAMIN D 25 HYDROXY (VIT D DEFICIENCY, FRACTURES): VITD: 37.54 ng/mL (ref 30.00–100.00)

## 2019-11-15 LAB — CBC WITH DIFFERENTIAL/PLATELET
Basophils Absolute: 0.1 10*3/uL (ref 0.0–0.1)
Basophils Relative: 0.6 % (ref 0.0–3.0)
Eosinophils Absolute: 0.1 10*3/uL (ref 0.0–0.7)
Eosinophils Relative: 1.2 % (ref 0.0–5.0)
HCT: 41.6 % (ref 36.0–46.0)
Hemoglobin: 14 g/dL (ref 12.0–15.0)
Lymphocytes Relative: 23 % (ref 12.0–46.0)
Lymphs Abs: 2.3 10*3/uL (ref 0.7–4.0)
MCHC: 33.6 g/dL (ref 30.0–36.0)
MCV: 94.5 fl (ref 78.0–100.0)
Monocytes Absolute: 0.7 10*3/uL (ref 0.1–1.0)
Monocytes Relative: 6.9 % (ref 3.0–12.0)
Neutro Abs: 6.8 10*3/uL (ref 1.4–7.7)
Neutrophils Relative %: 68.3 % (ref 43.0–77.0)
Platelets: 182 10*3/uL (ref 150.0–400.0)
RBC: 4.4 Mil/uL (ref 3.87–5.11)
RDW: 14 % (ref 11.5–15.5)
WBC: 10 10*3/uL (ref 4.0–10.5)

## 2019-11-15 LAB — HEPATIC FUNCTION PANEL
ALT: 11 U/L (ref 0–35)
AST: 14 U/L (ref 0–37)
Albumin: 4.3 g/dL (ref 3.5–5.2)
Alkaline Phosphatase: 63 U/L (ref 39–117)
Bilirubin, Direct: 0.1 mg/dL (ref 0.0–0.3)
Total Bilirubin: 0.3 mg/dL (ref 0.2–1.2)
Total Protein: 6.7 g/dL (ref 6.0–8.3)

## 2019-11-15 LAB — TSH: TSH: 2 u[IU]/mL (ref 0.35–4.50)

## 2019-11-15 LAB — HEMOGLOBIN A1C: Hgb A1c MFr Bld: 6.9 % — ABNORMAL HIGH (ref 4.6–6.5)

## 2019-11-15 MED ORDER — GABAPENTIN 300 MG PO CAPS: ORAL_CAPSULE | ORAL | 1 refills | Status: DC

## 2019-11-15 NOTE — Assessment & Plan Note (Signed)
Following w/ Dr Caryl Comes

## 2019-11-15 NOTE — Assessment & Plan Note (Signed)
Check labs and replete prn. 

## 2019-11-15 NOTE — Patient Instructions (Addendum)
Follow up in 3-4 months to recheck diabetes We'll notify you of your lab results and make any changes if needed Continue to work on healthy diet and regular exercise- you can do it! Try and decrease your smoking so you can eventually quit! Use the OTC wart remover pads.  If no improvement (it can take quite awhile!), let me know Call with any questions or concerns Have a great summer!!!

## 2019-11-15 NOTE — Assessment & Plan Note (Signed)
Pt's PE WNL w/ exception of obesity.  UTD on mammo, DEXA ordered.  UTD on colonoscopy, immunizations.  Check labs.  Anticipatory guidance provided.

## 2019-11-15 NOTE — Assessment & Plan Note (Signed)
Chronic problem, well controlled.  Currently asymptomatic.  Check labs.  No anticipated med changes.

## 2019-11-15 NOTE — Assessment & Plan Note (Signed)
Chronic problem.  Last A1C 7.  UTD on eye exam.  Foot exam done today.  On ARB for renal protection.  Check labs.  Adjust meds prn

## 2019-11-15 NOTE — Progress Notes (Signed)
   Subjective:    Patient ID: Lacey [name], female    DOB: 1950/07/02, 69 y.o.   MRN: [id number]  HPI CPE- UTD on mammo, colonoscopy, eye exam.  Due for foot exam.  DEXA scheduled.  UTD on immunizations.   Review of Systems Patient reports no vision/ hearing changes, adenopathy,fever, weight change,  persistant/recurrent hoarseness , swallowing issues, chest pain, palpitations, edema, persistant/recurrent cough, hemoptysis, dyspnea (rest/exertional/paroxysmal nocturnal), gastrointestinal bleeding (melena, rectal bleeding), abdominal pain, significant heartburn, bowel changes, GU symptoms (dysuria, hematuria, incontinence), Gyn symptoms (abnormal  bleeding, pain),  syncope, focal weakness, memory loss, numbness & tingling, skin/hair/nail changes, abnormal bruising or bleeding, anxiety, or depression.   This visit occurred during the SARS-CoV-2 public health emergency.  Safety protocols were in place, including screening questions prior to the visit, additional usage of staff PPE, and extensive cleaning of exam room while observing appropriate contact time as indicated for disinfecting solutions.       Objective:   Physical Exam General Appearance:    Alert, cooperative, no distress, appears stated age, obese  Head:    Normocephalic, without obvious abnormality, atraumatic  Eyes:    PERRL, conjunctiva/corneas clear, EOM's intact, fundi    benign, both eyes  Ears:    Normal TM's and external ear canals, both ears  Nose:   Deferred due to COVID  Throat:   Neck:   Supple, symmetrical, trachea midline, no adenopathy;    Thyroid: no enlargement/tenderness/nodules  Back:     Symmetric, no curvature, ROM normal, no CVA tenderness  Lungs:     Clear to auscultation bilaterally, respirations unlabored  Chest Wall:    No tenderness or deformity   Heart:    Regular rate and rhythm, S1 and S2 normal, no murmur, rub   or gallop  Breast Exam:    Deferred to mammo  Abdomen:     Soft, non-tender, bowel  sounds active all four quadrants,    no masses, no organomegaly  Genitalia:    Deferred  Rectal:    Extremities:   Extremities normal, atraumatic, no cyanosis or edema  Pulses:   2+ and symmetric all extremities  Skin:   Skin color, texture, turgor normal, no rashes or lesions  Lymph nodes:   Cervical, supraclavicular, and axillary nodes normal  Neurologic:   CNII-XII intact, normal strength, sensation and reflexes    throughout          Assessment & Plan:

## 2019-12-01 DIAGNOSIS — E119 Type 2 diabetes mellitus without complications: Secondary | ICD-10-CM | POA: Diagnosis not present

## 2019-12-02 ENCOUNTER — Other Ambulatory Visit: Payer: Self-pay | Admitting: Family Medicine

## 2019-12-19 ENCOUNTER — Other Ambulatory Visit: Payer: Self-pay | Admitting: Family Medicine

## 2019-12-20 ENCOUNTER — Encounter: Payer: Self-pay | Admitting: Family Medicine

## 2019-12-27 ENCOUNTER — Other Ambulatory Visit: Payer: Self-pay

## 2019-12-27 ENCOUNTER — Other Ambulatory Visit: Payer: Medicare PPO

## 2019-12-27 DIAGNOSIS — Z20822 Contact with and (suspected) exposure to covid-19: Secondary | ICD-10-CM | POA: Diagnosis not present

## 2019-12-28 LAB — SARS-COV-2, NAA 2 DAY TAT

## 2019-12-28 LAB — NOVEL CORONAVIRUS, NAA: SARS-CoV-2, NAA: NOT DETECTED

## 2019-12-30 ENCOUNTER — Other Ambulatory Visit: Payer: Self-pay | Admitting: Family Medicine

## 2020-01-02 DIAGNOSIS — M85852 Other specified disorders of bone density and structure, left thigh: Secondary | ICD-10-CM | POA: Diagnosis not present

## 2020-01-02 DIAGNOSIS — Z1231 Encounter for screening mammogram for malignant neoplasm of breast: Secondary | ICD-10-CM | POA: Diagnosis not present

## 2020-01-02 LAB — HM MAMMOGRAPHY

## 2020-01-03 ENCOUNTER — Encounter: Payer: Self-pay | Admitting: Family Medicine

## 2020-01-09 DIAGNOSIS — H04123 Dry eye syndrome of bilateral lacrimal glands: Secondary | ICD-10-CM | POA: Diagnosis not present

## 2020-01-09 DIAGNOSIS — H40033 Anatomical narrow angle, bilateral: Secondary | ICD-10-CM | POA: Diagnosis not present

## 2020-01-09 DIAGNOSIS — H2513 Age-related nuclear cataract, bilateral: Secondary | ICD-10-CM | POA: Diagnosis not present

## 2020-01-12 ENCOUNTER — Encounter: Payer: Self-pay | Admitting: General Practice

## 2020-01-12 ENCOUNTER — Other Ambulatory Visit: Payer: Self-pay | Admitting: General Practice

## 2020-01-12 ENCOUNTER — Other Ambulatory Visit: Payer: Self-pay | Admitting: Family Medicine

## 2020-01-12 DIAGNOSIS — D1801 Hemangioma of skin and subcutaneous tissue: Secondary | ICD-10-CM | POA: Diagnosis not present

## 2020-01-12 DIAGNOSIS — D225 Melanocytic nevi of trunk: Secondary | ICD-10-CM | POA: Diagnosis not present

## 2020-01-12 DIAGNOSIS — L820 Inflamed seborrheic keratosis: Secondary | ICD-10-CM | POA: Diagnosis not present

## 2020-01-12 DIAGNOSIS — L918 Other hypertrophic disorders of the skin: Secondary | ICD-10-CM | POA: Diagnosis not present

## 2020-01-12 DIAGNOSIS — B078 Other viral warts: Secondary | ICD-10-CM | POA: Diagnosis not present

## 2020-01-12 DIAGNOSIS — L821 Other seborrheic keratosis: Secondary | ICD-10-CM | POA: Diagnosis not present

## 2020-01-24 ENCOUNTER — Other Ambulatory Visit: Payer: Self-pay | Admitting: Family Medicine

## 2020-01-24 NOTE — Telephone Encounter (Signed)
Last OV 11/15/19 Clonazepam last filled 07/25/19 #90 with 1

## 2020-02-12 ENCOUNTER — Other Ambulatory Visit: Payer: Self-pay | Admitting: Family Medicine

## 2020-02-13 ENCOUNTER — Other Ambulatory Visit: Payer: Self-pay | Admitting: Family Medicine

## 2020-02-29 ENCOUNTER — Other Ambulatory Visit: Payer: Self-pay

## 2020-02-29 ENCOUNTER — Encounter: Payer: Self-pay | Admitting: Family Medicine

## 2020-02-29 ENCOUNTER — Ambulatory Visit: Payer: Medicare PPO | Admitting: Family Medicine

## 2020-02-29 VITALS — BP 130/82 | HR 60 | Temp 97.8°F | Resp 20 | Wt 224.0 lb

## 2020-02-29 DIAGNOSIS — Z720 Tobacco use: Secondary | ICD-10-CM

## 2020-02-29 DIAGNOSIS — E113391 Type 2 diabetes mellitus with moderate nonproliferative diabetic retinopathy without macular edema, right eye: Secondary | ICD-10-CM

## 2020-02-29 DIAGNOSIS — E11649 Type 2 diabetes mellitus with hypoglycemia without coma: Secondary | ICD-10-CM | POA: Diagnosis not present

## 2020-02-29 DIAGNOSIS — Z794 Long term (current) use of insulin: Secondary | ICD-10-CM

## 2020-02-29 LAB — BASIC METABOLIC PANEL
BUN: 22 mg/dL (ref 6–23)
CO2: 29 mEq/L (ref 19–32)
Calcium: 9.3 mg/dL (ref 8.4–10.5)
Chloride: 102 mEq/L (ref 96–112)
Creatinine, Ser: 1.08 mg/dL (ref 0.40–1.20)
GFR: 52.3 mL/min — ABNORMAL LOW (ref >60.00)
Glucose, Bld: 148 mg/dL — ABNORMAL HIGH (ref 70–99)
Potassium: 4.8 mEq/L (ref 3.5–5.1)
Sodium: 139 mEq/L (ref 135–145)

## 2020-02-29 LAB — HEMOGLOBIN A1C: Hgb A1c MFr Bld: 6.8 % — ABNORMAL HIGH (ref 4.6–6.5)

## 2020-02-29 NOTE — Progress Notes (Signed)
   Subjective:    Patient ID: Lacey [name], female    DOB: November 26, 1950, 69 y.o.   MRN: [id number]  HPI DM- chronic problem, on Lantus 70 units daily and Humalog 30 units TID.  UTD on eye exam, foot exam.  On ARB for renal protection.  Pt reports feeling well.  Pt reports she has had frequent lows- was 34 on Sunday.  Pt will adjust her Lantus/Humalog doses based on sugar readings and dietary intake.  CBG today 104.  No CP, SOB, HAs, visual changes, edema.  Occasional abd pain- shooting pain.  No N/V  Tobacco abuse- continues to smoke 1/3 of pack/day.  Not interested in quitting at this time.   Review of Systems For ROS see HPI   This visit occurred during the SARS-CoV-2 public health emergency.  Safety protocols were in place, including screening questions prior to the visit, additional usage of staff PPE, and extensive cleaning of exam room while observing appropriate contact time as indicated for disinfecting solutions.       Objective:   Physical Exam Vitals reviewed.  Constitutional:      General: She is not in acute distress.    Appearance: She is well-developed. She is obese.  HENT:     Head: Normocephalic and atraumatic.  Eyes:     Conjunctiva/sclera: Conjunctivae normal.     Pupils: Pupils are equal, round, and reactive to light.  Neck:     Thyroid: No thyromegaly.  Cardiovascular:     Rate and Rhythm: Normal rate and regular rhythm.     Pulses: Normal pulses.     Heart sounds: Normal heart sounds. No murmur heard.   Pulmonary:     Effort: Pulmonary effort is normal. No respiratory distress.     Breath sounds: Normal breath sounds.  Abdominal:     General: There is no distension.     Palpations: Abdomen is soft.     Tenderness: There is no abdominal tenderness.  Musculoskeletal:     Cervical back: Normal range of motion and neck supple.     Right lower leg: No edema.     Left lower leg: No edema.  Lymphadenopathy:     Cervical: No cervical adenopathy.  Skin:     General: Skin is warm and dry.  Neurological:     Mental Status: She is alert and oriented to person, place, and time.  Psychiatric:        Behavior: Behavior normal.           Assessment & Plan:

## 2020-02-29 NOTE — Assessment & Plan Note (Signed)
New.  Pt reports sugars are consistently low- which is concerning.  Based on this, will decrease Lantus to 60 units daily and decrease Humalog to 25units TID.  Encouraged her to continue to eat regularly and check her sugars.  Will follow.

## 2020-02-29 NOTE — Assessment & Plan Note (Signed)
Ongoing issue for pt.  Again encouraged her to quit smoking.  Will continue to follow.

## 2020-02-29 NOTE — Assessment & Plan Note (Signed)
Chronic problem.  UTD on foot exam, eye exam, and on ARB for renal protection.  Will adjust insulin based on hypoglycemia.  Now seeing Dr Katy Fitch for eyes.  Check labs and further adjust insulin as needed

## 2020-02-29 NOTE — Patient Instructions (Addendum)
Follow up in 3-4 months to recheck diabetes, cholesterol, and blood pressure We'll notify you of your lab results and make any changes if needed Continue to work on healthy diet and regular exercise- you can do it! Decrease the Lantus to 60 units nightly Decrease the Humalog to 25 units at each meal Make sure you are eating regularly to keep your sugar stable Try and stop smoking!  You can do it! Call with any questions or concerns Stay Safe!  Stay Healthy! HAPPY ANNIVERSARY!!!

## 2020-03-01 ENCOUNTER — Encounter: Payer: Self-pay | Admitting: General Practice

## 2020-03-21 ENCOUNTER — Encounter: Payer: Self-pay | Admitting: Family Medicine

## 2020-04-02 ENCOUNTER — Other Ambulatory Visit: Payer: Self-pay | Admitting: Family Medicine

## 2020-04-03 ENCOUNTER — Other Ambulatory Visit: Payer: Self-pay | Admitting: Family Medicine

## 2020-04-03 ENCOUNTER — Other Ambulatory Visit: Payer: Self-pay | Admitting: Internal Medicine

## 2020-04-06 ENCOUNTER — Other Ambulatory Visit: Payer: Self-pay | Admitting: Family Medicine

## 2020-04-20 ENCOUNTER — Other Ambulatory Visit: Payer: Self-pay | Admitting: Family Medicine

## 2020-05-22 ENCOUNTER — Other Ambulatory Visit: Payer: Self-pay | Admitting: Emergency Medicine

## 2020-05-22 DIAGNOSIS — G2581 Restless legs syndrome: Secondary | ICD-10-CM

## 2020-05-22 NOTE — Telephone Encounter (Signed)
[facility] pharmacy requesting refill of patient  Clonazepam for RLS Last rx 01/24/20 #90 1 RF went to [facility]: 02/29/20 DM CSC: 2014

## 2020-05-23 ENCOUNTER — Encounter: Payer: Self-pay | Admitting: Family Medicine

## 2020-05-23 ENCOUNTER — Other Ambulatory Visit: Payer: Self-pay

## 2020-05-23 DIAGNOSIS — E113391 Type 2 diabetes mellitus with moderate nonproliferative diabetic retinopathy without macular edema, right eye: Secondary | ICD-10-CM

## 2020-05-23 DIAGNOSIS — Z794 Long term (current) use of insulin: Secondary | ICD-10-CM

## 2020-05-23 MED ORDER — CLONAZEPAM 1 MG PO TABS: 1.0000 mg | ORAL_TABLET | Freq: Every day | ORAL | 1 refills | Status: DC

## 2020-05-23 MED ORDER — INSULIN LISPRO 100 UNIT/ML ~~LOC~~ SOLN: 25.0000 [IU] | Freq: Three times a day (TID) | SUBCUTANEOUS | 0 refills | Status: DC

## 2020-06-04 ENCOUNTER — Telehealth: Payer: Self-pay | Admitting: *Deleted

## 2020-06-04 NOTE — Telephone Encounter (Signed)
Insurance does not cover the humalog.  Insurance will cover:  Novolog Flexpen or generic Fiasp  Please advise.  Patient is almost out and she uses mail order.

## 2020-06-06 ENCOUNTER — Ambulatory Visit: Payer: Medicare PPO | Admitting: Family Medicine

## 2020-06-06 ENCOUNTER — Other Ambulatory Visit: Payer: Self-pay

## 2020-06-06 ENCOUNTER — Encounter: Payer: Self-pay | Admitting: Family Medicine

## 2020-06-06 ENCOUNTER — Other Ambulatory Visit: Payer: Self-pay | Admitting: Family Medicine

## 2020-06-06 VITALS — BP 128/72 | HR 61 | Temp 97.9°F | Resp 16 | Ht 62.0 in | Wt 227.2 lb

## 2020-06-06 DIAGNOSIS — I110 Hypertensive heart disease with heart failure: Secondary | ICD-10-CM | POA: Diagnosis not present

## 2020-06-06 DIAGNOSIS — E1169 Type 2 diabetes mellitus with other specified complication: Secondary | ICD-10-CM

## 2020-06-06 DIAGNOSIS — E785 Hyperlipidemia, unspecified: Secondary | ICD-10-CM | POA: Diagnosis not present

## 2020-06-06 DIAGNOSIS — R259 Unspecified abnormal involuntary movements: Secondary | ICD-10-CM

## 2020-06-06 DIAGNOSIS — E11649 Type 2 diabetes mellitus with hypoglycemia without coma: Secondary | ICD-10-CM | POA: Diagnosis not present

## 2020-06-06 LAB — HEPATIC FUNCTION PANEL
ALT: 13 U/L (ref 0–35)
AST: 15 U/L (ref 0–37)
Albumin: 4.2 g/dL (ref 3.5–5.2)
Alkaline Phosphatase: 52 U/L (ref 39–117)
Bilirubin, Direct: 0.1 mg/dL (ref 0.0–0.3)
Total Bilirubin: 0.4 mg/dL (ref 0.2–1.2)
Total Protein: 7.1 g/dL (ref 6.0–8.3)

## 2020-06-06 LAB — CBC WITH DIFFERENTIAL/PLATELET
Basophils Absolute: 0.1 10*3/uL (ref 0.0–0.1)
Basophils Relative: 0.8 % (ref 0.0–3.0)
Eosinophils Absolute: 0.1 10*3/uL (ref 0.0–0.7)
Eosinophils Relative: 1.7 % (ref 0.0–5.0)
HCT: 41 % (ref 36.0–46.0)
Hemoglobin: 13.7 g/dL (ref 12.0–15.0)
Lymphocytes Relative: 26.7 % (ref 12.0–46.0)
Lymphs Abs: 2 10*3/uL (ref 0.7–4.0)
MCHC: 33.4 g/dL (ref 30.0–36.0)
MCV: 94 fl (ref 78.0–100.0)
Monocytes Absolute: 0.5 10*3/uL (ref 0.1–1.0)
Monocytes Relative: 6.5 % (ref 3.0–12.0)
Neutro Abs: 4.9 10*3/uL (ref 1.4–7.7)
Neutrophils Relative %: 64.3 % (ref 43.0–77.0)
Platelets: 158 10*3/uL (ref 150.0–400.0)
RBC: 4.37 Mil/uL (ref 3.87–5.11)
RDW: 13.5 % (ref 11.5–15.5)
WBC: 7.7 10*3/uL (ref 4.0–10.5)

## 2020-06-06 LAB — LIPID PANEL
Cholesterol: 133 mg/dL (ref 0–200)
HDL: 49.1 mg/dL (ref >39.00)
LDL Cholesterol: 65 mg/dL (ref 0–99)
NonHDL: 83.51
Total CHOL/HDL Ratio: 3
Triglycerides: 91 mg/dL (ref 0.0–149.0)
VLDL: 18.2 mg/dL (ref 0.0–40.0)

## 2020-06-06 LAB — HEMOGLOBIN A1C: Hgb A1c MFr Bld: 6.7 % — ABNORMAL HIGH (ref 4.6–6.5)

## 2020-06-06 LAB — BASIC METABOLIC PANEL
BUN: 15 mg/dL (ref 6–23)
CO2: 31 mEq/L (ref 19–32)
Calcium: 9.5 mg/dL (ref 8.4–10.5)
Chloride: 107 mEq/L (ref 96–112)
Creatinine, Ser: 1 mg/dL (ref 0.40–1.20)
GFR: 57.26 mL/min — ABNORMAL LOW (ref >60.00)
Glucose, Bld: 97 mg/dL (ref 70–99)
Potassium: 3.8 mEq/L (ref 3.5–5.1)
Sodium: 142 mEq/L (ref 135–145)

## 2020-06-06 LAB — TSH: TSH: 2.88 u[IU]/mL (ref 0.35–4.50)

## 2020-06-06 MED ORDER — NOVOLOG FLEXPEN 100 UNIT/ML ~~LOC~~ SOPN: 25.0000 [IU] | PEN_INJECTOR | Freq: Three times a day (TID) | SUBCUTANEOUS | 0 refills | Status: DC

## 2020-06-06 NOTE — Assessment & Plan Note (Signed)
Ongoing issue for pt.  BMI is 41.56  She is frustrated w/ her inability to lose weight.  Again discussed low carb diet, regular exercise- which is limited for her due to orthopedic issues.

## 2020-06-06 NOTE — Telephone Encounter (Signed)
Per [facility], Novolog flex pen should be ordered in 15 mls increment. Order placed for 100 days no refills.

## 2020-06-06 NOTE — Assessment & Plan Note (Signed)
Chronic problem.  Well controlled on Verapamil 180mg  daily, Valsartan 320mg  daily, Lasix 20mg  daily.  Check labs.  No anticipated med changes.

## 2020-06-06 NOTE — Telephone Encounter (Signed)
Ok to switch to [employer] Pen w/ same instructions (25 units 3x/day)

## 2020-06-06 NOTE — Assessment & Plan Note (Signed)
Thankfully no recent hypoglycemic episodes noted since we adjusted her insulin.  Unfortunately insurance no longer covers Humalog so we are switching to Novalog.  On ARB for renal protection.  UTD on eye exam, foot exam.  Check labs.  Adjust meds prn

## 2020-06-06 NOTE — Assessment & Plan Note (Signed)
Chronic problem.  On Lipitor 20mg  daily and Fenofibrate 160mg  daily.  Currently asymptomatic.  Discussed need for healthy diet and regular exercise.  Check labs.  Adjust meds prn

## 2020-06-06 NOTE — Patient Instructions (Signed)
Follow up in 3-4 months to recheck diabetes We'll notify you of your lab results and make any changes if needed We'll call you with your neurology appt Continue to work on healthy diet and regular exercise- you can do it! Call with any questions or concerns Stay Safe!  Stay Healthy! Hang in there!!!

## 2020-06-06 NOTE — Progress Notes (Signed)
   Subjective:    Patient ID: Lacey [name], female    DOB: Mar 19, 1951, 70 y.o.   MRN: [id number]  HPI HTN- chronic problem, on Verapamil 180mg  daily, Valsartan 320mg  daily, Lasix 20mg  daily w/ good control.  Denies CP, SOB, HAs, visual changes, edema.  Hyperlipidemia- chronic problem, on Lipitor 20mg  daily and Fenofibrate 160mg  daily.  Denies abd pain, N/V  DM- chronic problem, on Lantus 60units at night and Humalog 25units TID.  Insurance will no longer cover Humalog.  On ARB for renal protection.  UTD on eye exam, foot exam.  Denies symptomatic lows.  R leg tremor- pt reports she will have to hold her leg if she needs to extend it for any reason due to shaking.  Noted at Ortho when they were evaluating R knee.  States leg will shake both sitting and standing.   Review of Systems For ROS see HPI   This visit occurred during the SARS-CoV-2 public health emergency.  Safety protocols were in place, including screening questions prior to the visit, additional usage of staff PPE, and extensive cleaning of exam room while observing appropriate contact time as indicated for disinfecting solutions.       Objective:   Physical Exam Vitals reviewed.  Constitutional:      General: She is not in acute distress.    Appearance: Normal appearance. She is obese. She is not ill-appearing.  HENT:     Head: Normocephalic and atraumatic.  Eyes:     Extraocular Movements: Extraocular movements intact.     Conjunctiva/sclera: Conjunctivae normal.     Pupils: Pupils are equal, round, and reactive to light.  Cardiovascular:     Rate and Rhythm: Normal rate and regular rhythm.     Pulses: Normal pulses.     Heart sounds: No murmur heard.   Pulmonary:     Effort: Pulmonary effort is normal. No respiratory distress.     Breath sounds: Normal breath sounds. No wheezing, rhonchi or rales.  Abdominal:     General: There is no distension.     Palpations: Abdomen is soft.     Tenderness: There is no  abdominal tenderness.  Musculoskeletal:     Cervical back: Normal range of motion and neck supple.     Right lower leg: No edema.     Left lower leg: No edema.  Lymphadenopathy:     Cervical: No cervical adenopathy.  Skin:    General: Skin is warm and dry.  Neurological:     General: No focal deficit present.     Mental Status: She is alert and oriented to person, place, and time.     Cranial Nerves: No cranial nerve deficit.     Gait: Gait normal.     Comments: Pt has notable R leg tremor when she lifts it- has to hold it for support  Psychiatric:        Mood and Affect: Mood normal.        Behavior: Behavior normal.        Thought Content: Thought content normal.           Assessment & Plan:  R leg tremor- new.  Noted when pt has to extend her R leg or hold it up.  The leg begins to shake and she then needs to support it.  She states this can also happen when standing.  Will refer to neuro for complete evaluation.  Pt expressed understanding and is in agreement w/ plan.

## 2020-06-17 ENCOUNTER — Encounter: Payer: Self-pay | Admitting: Internal Medicine

## 2020-06-20 DIAGNOSIS — N3020 Other chronic cystitis without hematuria: Secondary | ICD-10-CM | POA: Diagnosis not present

## 2020-06-20 DIAGNOSIS — N281 Cyst of kidney, acquired: Secondary | ICD-10-CM | POA: Diagnosis not present

## 2020-06-20 DIAGNOSIS — N200 Calculus of kidney: Secondary | ICD-10-CM | POA: Diagnosis not present

## 2020-07-08 DIAGNOSIS — H25813 Combined forms of age-related cataract, bilateral: Secondary | ICD-10-CM | POA: Diagnosis not present

## 2020-07-08 DIAGNOSIS — H04123 Dry eye syndrome of bilateral lacrimal glands: Secondary | ICD-10-CM | POA: Diagnosis not present

## 2020-07-08 DIAGNOSIS — H40033 Anatomical narrow angle, bilateral: Secondary | ICD-10-CM | POA: Diagnosis not present

## 2020-07-10 ENCOUNTER — Other Ambulatory Visit: Payer: Self-pay | Admitting: Family Medicine

## 2020-07-18 ENCOUNTER — Other Ambulatory Visit: Payer: Self-pay | Admitting: Family Medicine

## 2020-07-19 ENCOUNTER — Other Ambulatory Visit: Payer: Self-pay | Admitting: Internal Medicine

## 2020-08-12 ENCOUNTER — Other Ambulatory Visit: Payer: Self-pay | Admitting: Internal Medicine

## 2020-08-12 ENCOUNTER — Other Ambulatory Visit: Payer: Self-pay | Admitting: Family Medicine

## 2020-08-15 ENCOUNTER — Ambulatory Visit: Payer: Medicare PPO | Admitting: Neurology

## 2020-08-15 ENCOUNTER — Encounter: Payer: Self-pay | Admitting: Neurology

## 2020-08-15 VITALS — BP 150/62 | HR 58 | Ht 62.0 in | Wt 227.5 lb

## 2020-08-15 DIAGNOSIS — R251 Tremor, unspecified: Secondary | ICD-10-CM

## 2020-08-15 DIAGNOSIS — R202 Paresthesia of skin: Secondary | ICD-10-CM | POA: Diagnosis not present

## 2020-08-15 DIAGNOSIS — M545 Low back pain, unspecified: Secondary | ICD-10-CM | POA: Diagnosis not present

## 2020-08-15 MED ORDER — GABAPENTIN 300 MG PO CAPS: 600.0000 mg | ORAL_CAPSULE | Freq: Three times a day (TID) | ORAL | 3 refills | Status: DC

## 2020-08-15 NOTE — Progress Notes (Signed)
Chief Complaint  Patient presents with  . New Patient (Initial Visit)    Reports right leg tremor, present at least one year. Worse when holding leg up or extending it out. More recently, she has also noticed tremors in her right hand. She has problems holding items steadily and writing.       ASSESSMENT AND PLAN  [name] is a 70 y.o. female  Intermittent right leg shaking  That is often associated with her hypoglycemia episode, standing in one position for extended period of time  Evidence of mild length dependent sensory changes, consistent with her diabetic peripheral neuropathy, reported leg shaking could also indicate orthostatic hypotension, worsened by her low back pain  Have suggested her increase water intake, close monitoring her glucose level, vital signs during spells  There was no Parkinson features     DIAGNOSTIC DATA (LABS, IMAGING, TESTING) - I reviewed patient records, labs, notes, testing and imaging myself where available.  Labs in 2022: normal CBC, LFTs, tsh, bmp, LDL 65, A1C 6.7  HISTORICAL  Lacey [name], is a 70 year old female seen in request by her primary care doctor Lacey [name], for evaluation of right leg tremor, initial evaluation was on August 17, 2020  I reviewed and summarized the referring note.  Past medical history Hyperlipidemia Diabetes since 1999, with peripheral neuropathy Hypothyroidism Hypertension Chronic insomnia Restless leg.  Patient reported intermittent right leg shaking, only happened when she holding her right leg in certain posture for extended period of time, such as when she is trying to have a pedicure, she has to hold her right leg to calm down the tremor,  She also complains of intermittent bilateral hand shaking with prolonged standing, it is often alleviated quickly by shifting weight back-and-forth between 2 legs, she denies gait abnormality,  She reported frequent hypoglycemia episode, during those spells,  she would have increased bilateral lower extremity shaking, this improved with normalization of her glucose  She also complains of bilateral feet, and intermittent hands numbness tingling, feeling cold   REVIEW OF SYSTEMS:  Full 14 system review of systems performed and notable only for as above All other review of systems were negative.  PHYSICAL EXAM:   Vitals:   08/15/20 1054  BP: (!) 150/62  Pulse: (!) 58  Weight: 227 lb 8 oz (103.2 kg)  Height: 5\' 2"  (1.575 m)   Not recorded     Body mass index is 41.61 kg/m.  PHYSICAL EXAMNIATION:  Gen: NAD, conversant, well nourised, well groomed                     Cardiovascular: Regular rate rhythm, no peripheral edema, warm, nontender. Eyes: Conjunctivae clear without exudates or hemorrhage Neck: Supple, no carotid bruits. Pulmonary: Clear to auscultation bilaterally   NEUROLOGICAL EXAM:  MENTAL STATUS: Speech:    Speech is normal; fluent and spontaneous with normal comprehension.  Cognition:     Orientation to time, place and person     Normal recent and remote memory     Normal Attention span and concentration     Normal Language, naming, repeating,spontaneous speech     Fund of knowledge   CRANIAL NERVES: CN II: Visual fields are full to confrontation. Pupils are round equal and briskly reactive to light. CN III, IV, VI: extraocular movement are normal. No ptosis. CN V: Facial sensation is intact to light touch CN VII: Face is symmetric with normal eye closure  CN VIII: Hearing is normal to causal  conversation. CN IX, X: Phonation is normal. CN XI: Head turning and shoulder shrug are intact  MOTOR: There is no pronator drift of out-stretched arms. Muscle bulk and tone are normal. Muscle strength is normal.  REFLEXES: Reflexes are 2+ and symmetric at the biceps, triceps, knees, and ankles. Plantar responses are flexor.  SENSORY: Mildly decreased vibratory sensation at toes, length dependent decreased light  touch pinprick to ankle level  COORDINATION: There is no trunk or limb dysmetria noted.  GAIT/STANCE: Posture is normal. Gait is steady with normal steps, base, arm swing, and turning. Heel and toe walking are normal. Tandem gait is normal.  Romberg is absent.  ALLERGIES: No Known Allergies  HOME MEDICATIONS: Current Outpatient Medications  Medication Sig Dispense Refill  . Alcohol Swabs (B-D SINGLE USE SWABS REGULAR) PADS USE AS DIRECTED  AS NEEDED. 200 each 3  . aspirin EC 81 MG tablet Take 81 mg by mouth daily.    Marland Kitchen atorvastatin (LIPITOR) 20 MG tablet TAKE 1 TABLET BY MOUTH EVERY DAY 90 tablet 1  . Blood Glucose Monitoring Suppl (TRUE METRIX AIR GLUCOSE METER) DEVI 1 each by Does not apply route 4 (four) times daily. Dx. E11.9 1 each 3  . clonazePAM (KLONOPIN) 1 MG tablet Take 1 tablet (1 mg total) by mouth at bedtime. For RLS 90 tablet 1  . clotrimazole-betamethasone (LOTRISONE) cream Apply 1 application topically 2 (two) times daily. 45 g 0  . diclofenac Sodium (VOLTAREN) 1 % GEL APPLY 2 GRAMS TO AFFECTED AREA 4 TIMES A DAY 300 g 1  . diphenoxylate-atropine (LOMOTIL) 2.5-0.025 MG per tablet Take 1 tablet by mouth 4 (four) times daily as needed for diarrhea or loose stools. 30 tablet 0  . DROPLET PEN NEEDLES 31G X 8 MM MISC USE 4 TIMES DAILY 400 each 6  . fenofibrate 160 MG tablet TAKE 1 TABLET BY MOUTH  DAILY 90 tablet 3  . furosemide (LASIX) 20 MG tablet TAKE 1 TABLET BY MOUTH  DAILY 90 tablet 3  . gabapentin (NEURONTIN) 300 MG capsule TAKE 1 CAPSULE BY MOUTH THREE TIMES A DAY 270 capsule 1  . glucose blood (TRUE METRIX BLOOD GLUCOSE TEST) test strip Use as instructed to test blood sugar 4 times daily. Dx. E11.9 400 each 3  . insulin glargine (LANTUS SOLOSTAR) 100 UNIT/ML Solostar Pen Inject 60 Units into the skin at bedtime. 75 mL 2  . Lancets Super Thin 28G MISC Please use as directed to test sugars 4 times daily. Dx. E11.9 420 each 3  . lansoprazole (PREVACID) 15 MG capsule  Take 15 mg by mouth daily.    Marland Kitchen levothyroxine (SYNTHROID) 50 MCG tablet TAKE 1 TABLET BY MOUTH EVERY DAY BEFORE BREAKFAST 90 tablet 1  . nitrofurantoin, macrocrystal-monohydrate, (MACROBID) 100 MG capsule     . NOVOLOG FLEXPEN 100 UNIT/ML FlexPen INJECT 25 UNITS SUBCUTANEOUSLY THREE TIMES DAILY WITH MEALS 75 mL 0  . ondansetron (ZOFRAN) 4 MG tablet Take 1 tablet (4 mg total) by mouth every 8 (eight) hours as needed for nausea or vomiting. 12 tablet 0  . traZODone (DESYREL) 100 MG tablet TAKE 1 TABLET BY MOUTH AT  BEDTIME 90 tablet 3  . valsartan (DIOVAN) 320 MG tablet TAKE 1 TABLET EVERY DAY 90 tablet 2  . verapamil (CALAN-SR) 180 MG CR tablet TAKE 1 TABLET EVERY DAY (APPOINTMENT IS NEEDED FOR REFILLS) 30 tablet 0   No current facility-administered medications for this visit.    PAST MEDICAL HISTORY: Past Medical History:  Diagnosis Date  .  Adenomatous colon polyp    hyperplastic  . Anginal pain ([facility])    not recent  . Arthritis    NECK, KNEES, FINGERS, TOES  . Atrial tachycardia ([place]) CARDIOLOGIST - DR Caryl Comes (LAST VISIT AUG 2012)   Echo 12/11: EF 55-60%, Mild LVH, grade 1 diast dysfxn;   holter 1/12: ATach  . Atypical chest pain    a. 07/2004 Cath: Clean cors;  b. 04/2010 Myoview: EF 63%, no ischemia  . Blood transfusion without reported diagnosis 1969  . Chronic kidney disease   . Diabetes mellitus, type 2 ([facility])    ORAL AND INSULIN MEDS (LAST A1C  7.3)  . Diverticulosis   . Erythema CURRENT-- CLOSED ABD. WALL ABSCESS   PER PCP NOTE (03-31-11)- MRSA-- TAKES DOXYCYCLINE  . GERD (gastroesophageal reflux disease)    CONTROLLED W/ PREVACID  . History of kidney stones 2012  . Hyperlipidemia   . Hypertension   . Insomnia    TAKES MEDS PRN  . Neuropathy, peripheral   . Obesity (BMI 30-39.9) 02/19/2015  . Pneumonia    as child  . Post op infection 11/07/12   left bunionectomy  . Restless leg syndrome   . Sepsis, urinary HISTORY - 2004  . Sleep apnea   . Tremor   . Vitamin D  deficiency     PAST SURGICAL HISTORY: Past Surgical History:  Procedure Laterality Date  . ABDOMINAL HYSTERECTOMY  1995   ovaries remain  . BUNIONECTOMY Left 10/24/12  . BUNIONECTOMY Right 05/17/2017  . CARDIAC CATHETERIZATION  07/10/04  . CARPAL TUNNEL RELEASE  2000   RIGHT  . CERVICAL FUSION  10-12-07   C5 - 7  . COLONOSCOPY    . CYSTOSCOPY WITH RETROGRADE PYELOGRAM, URETEROSCOPY AND STENT PLACEMENT Left 11/28/2012   Procedure: LEFT RETROGRADE PYELOGRAM, LEFT URETEROSCOPY, ;  Surgeon: Claybon Jabs, MD;  Location: [facility];  Service: Urology;  Laterality: Left;  . CYSTOSCOPY/RETROGRADE/URETEROSCOPY/STONE EXTRACTION WITH BASKET  X2 2004 & 2009   LEFT   . GASTRIC BYPASS  1981  . KNEE ARTHROSCOPY  AUG 2012   LEFT  . LAPAROSCOPIC CHOLECYSTECTOMY  2001  . left thumb joint surgery  2013  . PERCUTANEOUS NEPHROSTOLITHOTOMY  02-27-11   LEFT  . POLYPECTOMY    . RIGHT THUMB JOINT SURG.   MAY 2011  . TRIGGER FINGER RELEASE  2010   RIGHT THUMB  . UPPER GASTROINTESTINAL ENDOSCOPY    . URETERAL STENT PLACEMENT  X2  2004  &  2009   LEFT  . URETEROSCOPY  04/06/2011   Procedure: URETEROSCOPY;  Surgeon: Claybon Jabs, MD;  Location: [facility];  Service: Urology;  Laterality: Left;  L Ureteroscopy Laser Litho & Stent     FAMILY HISTORY: Family History  Problem Relation Age of Onset  . Heart attack Father   . Lung disease Father   . Hyperlipidemia Mother   . Hypertension Mother   . Heart disease Sister   . Hypertension Sister   . Colon polyps Sister   . Hypertension Sister   . Colon polyps Sister   . Hypertension Sister   . Hypertension Sister   . Lung cancer Sister 28       stage 4   . Diabetes Other   . Breast cancer Other   . Heart disease Other   . Colon cancer Other 60       nephew  . Esophageal cancer Neg Hx   . Rectal cancer Neg Hx   . Stomach  cancer Neg Hx   . Amblyopia Neg Hx   . Blindness Neg Hx   . Cataracts Neg Hx   . Glaucoma Neg Hx   .  Retinal detachment Neg Hx   . Strabismus Neg Hx   . Retinitis pigmentosa Neg Hx     SOCIAL HISTORY: Social History   Socioeconomic History  . Marital status: Married    Spouse name: Not on file  . Number of children: 2  . Years of education: 80  . Highest education level: High school graduate  Occupational History  . Occupation: Engineer, materials middle school    Employer: [employer]    Comment: in office  . Occupation: Retired  Tobacco Use  . Smoking status: Current Every Day Smoker    Packs/day: 0.25    Years: 43.00    Pack years: 10.75    Types: Cigarettes  . Smokeless tobacco: Never Used  . Tobacco comment: 0.75ppd x19yrs, 0.25ppd x48yr (07/12/15). Counseled to quit -smoking, nicotine replacement and smoking cessation classes discussed-3-4 a day as of 09-06-15  Substance and Sexual Activity  . Alcohol use: No    Alcohol/week: 0.0 standard drinks  . Drug use: No  . Sexual activity: Not on file  Other Topics Concern  . Not on file  Social History Narrative   2 children, 2 stepchildren   Lives with husband.   Right-handed.   No daily caffeine.   Social Determinants of Health   Financial Resource Strain: Low Risk   . Difficulty of Paying Living Expenses: Not hard at all  Food Insecurity: No Food Insecurity  . Worried About Charity fundraiser in the Last Year: Never true  . Ran Out of Food in the Last Year: Never true  Transportation Needs: No Transportation Needs  . Lack of Transportation (Medical): No  . Lack of Transportation (Non-Medical): No  Physical Activity: Inactive  . Days of Exercise per Week: 0 days  . Minutes of Exercise per Session: 0 min  Stress: No Stress Concern Present  . Feeling of Stress : Not at all  Social Connections: Not on file  Intimate Partner Violence: Not At Risk  . Fear of Current or Ex-Partner: No  . Emotionally Abused: No  . Physically Abused: No  . Sexually Abused: No      Marcial Pacas, M.D. Ph.D.  [facility] [address], [place], [place] [address] Ph: [phone] Fax: [phone]  CC:  Lacey Minium, MD [address] [place],  [place] [address]  Lacey Minium, MD

## 2020-08-20 ENCOUNTER — Other Ambulatory Visit: Payer: Self-pay

## 2020-08-20 ENCOUNTER — Ambulatory Visit (AMBULATORY_SURGERY_CENTER): Payer: Self-pay | Admitting: *Deleted

## 2020-08-20 VITALS — Ht 62.0 in | Wt 227.0 lb

## 2020-08-20 DIAGNOSIS — Z86010 Personal history of colonic polyps: Secondary | ICD-10-CM

## 2020-08-20 MED ORDER — SUTAB 1479-225-188 MG PO TABS: 24.0000 | ORAL_TABLET | ORAL | 0 refills | Status: DC

## 2020-08-20 MED ORDER — SUPREP BOWEL PREP KIT 17.5-3.13-1.6 GM/177ML PO SOLN: 1.0000 | Freq: Once | ORAL | 0 refills | Status: AC

## 2020-08-20 NOTE — Progress Notes (Signed)
No egg or soy allergy known to patient  No issues with past sedation with any surgeries or procedures Patient denies ever being told they had issues or difficulty with intubation  No FH of Malignant Hyperthermia No diet pills per patient No home 02 use per patient  No blood thinners per patient  Pt denies issues with constipation - occ constipation with Lomotil- takes PRN with travel only  No A fib or A flutter  COVID 19 guidelines implemented in [place] today with Pt and RN  Pt is fully vaccinated  for Covid    Due to the COVID-19 pandemic we are asking patients to follow certain guidelines.  Pt aware of COVID protocols and [facility] guidelines

## 2020-08-20 NOTE — Addendum Note (Signed)
Addended by: Steva Ready on: 08/20/2020 10:30 AM   Modules accepted: Orders

## 2020-08-22 DIAGNOSIS — H2511 Age-related nuclear cataract, right eye: Secondary | ICD-10-CM | POA: Diagnosis not present

## 2020-09-02 ENCOUNTER — Encounter: Payer: Self-pay | Admitting: Internal Medicine

## 2020-09-03 ENCOUNTER — Ambulatory Visit (AMBULATORY_SURGERY_CENTER): Payer: Medicare PPO | Admitting: Internal Medicine

## 2020-09-03 ENCOUNTER — Other Ambulatory Visit: Payer: Self-pay

## 2020-09-03 ENCOUNTER — Encounter: Payer: Self-pay | Admitting: Internal Medicine

## 2020-09-03 VITALS — BP 111/44 | HR 57 | Temp 98.9°F | Resp 15 | Wt 221.0 lb

## 2020-09-03 DIAGNOSIS — E119 Type 2 diabetes mellitus without complications: Secondary | ICD-10-CM | POA: Diagnosis not present

## 2020-09-03 DIAGNOSIS — D123 Benign neoplasm of transverse colon: Secondary | ICD-10-CM | POA: Diagnosis not present

## 2020-09-03 DIAGNOSIS — D122 Benign neoplasm of ascending colon: Secondary | ICD-10-CM | POA: Diagnosis not present

## 2020-09-03 DIAGNOSIS — Z86010 Personal history of colonic polyps: Secondary | ICD-10-CM

## 2020-09-03 DIAGNOSIS — K219 Gastro-esophageal reflux disease without esophagitis: Secondary | ICD-10-CM | POA: Diagnosis not present

## 2020-09-03 DIAGNOSIS — I10 Essential (primary) hypertension: Secondary | ICD-10-CM | POA: Diagnosis not present

## 2020-09-03 DIAGNOSIS — D120 Benign neoplasm of cecum: Secondary | ICD-10-CM | POA: Diagnosis not present

## 2020-09-03 DIAGNOSIS — G2581 Restless legs syndrome: Secondary | ICD-10-CM | POA: Diagnosis not present

## 2020-09-03 MED ORDER — SODIUM CHLORIDE 0.9 % IV SOLN: 500.0000 mL | Freq: Once | INTRAVENOUS | Status: DC

## 2020-09-03 NOTE — Progress Notes (Signed)
Report to PACU, RN, vss, BBS= Clear.  

## 2020-09-03 NOTE — Progress Notes (Signed)
Pt's states no medical or surgical changes since previsit or office visit. 

## 2020-09-03 NOTE — Patient Instructions (Addendum)
Handout was given to your care partner on polyps. Sugar was 117 in the recovery. You may resume your current medications today. Await biopsy results.  May take 1-3 weeks to receive pathology results. Repeat colonoscopy in 5 years for surveillance. Please call if any questions or concerns.     YOU HAD AN ENDOSCOPIC PROCEDURE TODAY AT [facility]:   Refer to the procedure report that was given to you for any specific questions about what was found during the examination.  If the procedure report does not answer your questions, please call your gastroenterologist to clarify.  If you requested that your care partner not be given the details of your procedure findings, then the procedure report has been included in a sealed envelope for you to review at your convenience later.  YOU SHOULD EXPECT: Some feelings of bloating in the abdomen. Passage of more gas than usual.  Walking can help get rid of the air that was put into your GI tract during the procedure and reduce the bloating. If you had a lower endoscopy (such as a colonoscopy or flexible sigmoidoscopy) you may notice spotting of blood in your stool or on the toilet paper. If you underwent a bowel prep for your procedure, you may not have a normal bowel movement for a few days.  Please Note:  You might notice some irritation and congestion in your nose or some drainage.  This is from the oxygen used during your procedure.  There is no need for concern and it should clear up in a day or so.  SYMPTOMS TO REPORT IMMEDIATELY:   Following lower endoscopy (colonoscopy or flexible sigmoidoscopy):  Excessive amounts of blood in the stool  Significant tenderness or worsening of abdominal pains  Swelling of the abdomen that is new, acute  Fever of 100F or higher   For urgent or emergent issues, a gastroenterologist can be reached at any hour by calling [phone]. Do not use MyChart messaging for urgent concerns.    DIET:   We do recommend a small meal at first, but then you may proceed to your regular diet.  Drink plenty of fluids but you should avoid alcoholic beverages for 24 hours.  ACTIVITY:  You should plan to take it easy for the rest of today and you should NOT DRIVE or use heavy machinery until tomorrow (because of the sedation medicines used during the test).    FOLLOW UP: Our staff will call the number listed on your records 48-72 hours following your procedure to check on you and address any questions or concerns that you may have regarding the information given to you following your procedure. If we do not reach you, we will leave a message.  We will attempt to reach you two times.  During this call, we will ask if you have developed any symptoms of COVID 19. If you develop any symptoms (ie: fever, flu-like symptoms, shortness of breath, cough etc.) before then, please call [phone].  If you test positive for Covid 19 in the 2 weeks post procedure, please call and report this information to [place].    If any biopsies were taken you will be contacted by phone or by letter within the next 1-3 weeks.  Please call us at [phone] if you have not heard about the biopsies in 3 weeks.    SIGNATURES/CONFIDENTIALITY: You and/or your care partner have signed paperwork which will be entered into your electronic medical record.  These signatures attest to  the fact that that the information above on your After Visit Summary has been reviewed and is understood.  Full responsibility of the confidentiality of this discharge information lies with you and/or your care-partner.

## 2020-09-03 NOTE — Progress Notes (Signed)
Called to room to assist during endoscopic procedure.  Patient ID and intended procedure confirmed with present staff. Received instructions for my participation in the procedure from the performing physician.  

## 2020-09-03 NOTE — Op Note (Signed)
[facility] Patient Name: Lacey [name] Procedure Date: 09/03/2020 8:47 AM MRN: [id number] Endoscopist: Docia Chuck. Henrene Pastor , MD Age: 70 Referring MD:  Date of Birth: 1951-01-06 Gender: Female Account #: [id number] Procedure:                Colonoscopy with cold snare polypectomy x 5 Indications:              High risk colon cancer surveillance: Personal                            history of multiple (3 or more) adenomas. Multiple                            previous examinations. Most recently 2017 Medicines:                Monitored Anesthesia Care Procedure:                Pre-Anesthesia Assessment:                           - Prior to the procedure, a History and Physical                            was performed, and patient medications and                            allergies were reviewed. The patient's tolerance of                            previous anesthesia was also reviewed. The risks                            and benefits of the procedure and the sedation                            options and risks were discussed with the patient.                            All questions were answered, and informed consent                            was obtained. Prior Anticoagulants: The patient has                            taken no previous anticoagulant or antiplatelet                            agents. ASA Grade Assessment: II - A patient with                            mild systemic disease. After reviewing the risks                            and benefits, the patient was deemed in  satisfactory condition to undergo the procedure.                           After obtaining informed consent, the colonoscope                            was passed under direct vision. Throughout the                            procedure, the patient's blood pressure, pulse, and                            oxygen saturations were monitored continuously. The                             Colonoscope was introduced through the anus and                            advanced to the the cecum, identified by                            appendiceal orifice and ileocecal valve. The                            ileocecal valve, appendiceal orifice, and rectum                            were photographed. The quality of the bowel                            preparation was excellent. The colonoscopy was                            performed without difficulty. The patient tolerated                            the procedure well. The bowel preparation used was                            SUPREP via split dose instruction. Scope In: 9:09:31 AM Scope Out: 9:26:33 AM Scope Withdrawal Time: 0 hours 13 minutes 36 seconds  Total Procedure Duration: 0 hours 17 minutes 2 seconds  Findings:                 Five polyps were found in the transverse colon,                            ascending colon and cecum. The polyps were 2 to 4                            mm in size. These polyps were removed with a cold                            snare. Resection and retrieval were  complete.                           The exam was otherwise without abnormality on                            direct and retroflexion views. Complications:            No immediate complications. Estimated blood loss:                            None. Estimated Blood Loss:     Estimated blood loss: none. Impression:               - Five 2 to 4 mm polyps in the transverse colon, in                            the ascending colon and in the cecum, removed with                            a cold snare. Resected and retrieved.                           - The examination was otherwise normal on direct                            and retroflexion views. Recommendation:           - Repeat colonoscopy in 5 years for surveillance.                           - Patient has a contact number available for                            emergencies. The  signs and symptoms of potential                            delayed complications were discussed with the                            patient. Return to normal activities tomorrow.                            Written discharge instructions were provided to the                            patient.                           - Resume previous diet.                           - Continue present medications.                           - Await pathology results. Docia Chuck. Henrene Pastor, MD 09/03/2020 9:31:41 AM This report has been signed  electronically.

## 2020-09-03 NOTE — Progress Notes (Signed)
No problems noted in the recovery room. maw 

## 2020-09-05 ENCOUNTER — Telehealth: Payer: Self-pay

## 2020-09-05 NOTE — Telephone Encounter (Signed)
  Follow up Call-  Call back number 09/03/2020  Post procedure Call Back phone  # [phone]  Permission to leave phone message Yes  Some recent data might be hidden     Patient questions:  Do you have a fever, pain , or abdominal swelling? No. Pain Score  0 *  Have you tolerated food without any problems? Yes.    Have you been able to return to your normal activities? Yes.    Do you have any questions about your discharge instructions: Diet   No. Medications  No. Follow up visit  No.  Do you have questions or concerns about your Care? No.  Actions: * If pain score is 4 or above: No action needed, pain <4.  1. Have you developed a fever since your procedure? no  2.   Have you had an respiratory symptoms (SOB or cough) since your procedure? no  3.   Have you tested positive for COVID 19 since your procedure no  4.   Have you had any family members/close contacts diagnosed with the COVID 19 since your procedure?  no   If yes to any of these questions please route to Joylene John, RN and Joella Prince, RN

## 2020-09-11 ENCOUNTER — Encounter: Payer: Self-pay | Admitting: Internal Medicine

## 2020-09-17 ENCOUNTER — Encounter: Payer: Self-pay | Admitting: Family Medicine

## 2020-09-17 ENCOUNTER — Ambulatory Visit: Payer: Medicare PPO | Admitting: Family Medicine

## 2020-09-17 ENCOUNTER — Other Ambulatory Visit: Payer: Self-pay

## 2020-09-17 VITALS — BP 118/62 | HR 57 | Temp 97.8°F | Resp 17 | Ht 62.0 in | Wt 230.2 lb

## 2020-09-17 DIAGNOSIS — Z794 Long term (current) use of insulin: Secondary | ICD-10-CM | POA: Diagnosis not present

## 2020-09-17 DIAGNOSIS — E113391 Type 2 diabetes mellitus with moderate nonproliferative diabetic retinopathy without macular edema, right eye: Secondary | ICD-10-CM | POA: Diagnosis not present

## 2020-09-17 LAB — BASIC METABOLIC PANEL
BUN: 19 mg/dL (ref 6–23)
CO2: 26 mEq/L (ref 19–32)
Calcium: 9.1 mg/dL (ref 8.4–10.5)
Chloride: 106 mEq/L (ref 96–112)
Creatinine, Ser: 1.09 mg/dL (ref 0.40–1.20)
GFR: 51.53 mL/min — ABNORMAL LOW (ref >60.00)
Glucose, Bld: 81 mg/dL (ref 70–99)
Potassium: 3.9 mEq/L (ref 3.5–5.1)
Sodium: 140 mEq/L (ref 135–145)

## 2020-09-17 LAB — HEMOGLOBIN A1C: Hgb A1c MFr Bld: 6.7 % — ABNORMAL HIGH (ref 4.6–6.5)

## 2020-09-17 NOTE — Assessment & Plan Note (Signed)
Chronic problem.  Pt has hx of good control on Lantus and Novolog.  UTD on eye exam, foot exam.  On ARB for renal protection.  Encouraged low carb diet and physical activity as able.  Check labs.  Adjust meds prn

## 2020-09-17 NOTE — Progress Notes (Signed)
   Subjective:    Patient ID: Lacey [name], female    DOB: 01/19/1951, 70 y.o.   MRN: [id number]  HPI DM- chronic problem, on Lantus 60 units nightly (pt will adjust based on sugars), Novolog 25 units TID w/ hx of good control.  UTD on eye exam, foot exam.  On ARB for renal protection.  Having some symptomatic lows- but nothing below 40s.  Pt is aware of them and knows that she needs to eat.  No CP, SOB, HAs above baseline, abd pain, N/V.  + neuropathy but no sores or blisters.   Review of Systems For ROS see HPI   This visit occurred during the SARS-CoV-2 public health emergency.  Safety protocols were in place, including screening questions prior to the visit, additional usage of staff PPE, and extensive cleaning of exam room while observing appropriate contact time as indicated for disinfecting solutions.       Objective:   Physical Exam Vitals reviewed.  Constitutional:      General: She is not in acute distress.    Appearance: Normal appearance. She is well-developed. She is obese.  HENT:     Head: Normocephalic and atraumatic.  Eyes:     Conjunctiva/sclera: Conjunctivae normal.     Pupils: Pupils are equal, round, and reactive to light.  Neck:     Thyroid: No thyromegaly.  Cardiovascular:     Rate and Rhythm: Normal rate and regular rhythm.     Pulses: Normal pulses.     Heart sounds: Normal heart sounds. No murmur heard.   Pulmonary:     Effort: Pulmonary effort is normal. No respiratory distress.     Breath sounds: Normal breath sounds.  Abdominal:     General: There is no distension.     Palpations: Abdomen is soft.     Tenderness: There is no abdominal tenderness.  Musculoskeletal:     Cervical back: Normal range of motion and neck supple.     Right lower leg: No edema.     Left lower leg: No edema.  Lymphadenopathy:     Cervical: No cervical adenopathy.  Skin:    General: Skin is warm and dry.  Neurological:     General: No focal deficit present.     Mental  Status: She is alert and oriented to person, place, and time.  Psychiatric:        Mood and Affect: Mood normal.        Behavior: Behavior normal.        Thought Content: Thought content normal.           Assessment & Plan:

## 2020-09-17 NOTE — Patient Instructions (Signed)
Schedule your complete physical in 3-4 months We'll notify you of your lab results and make any changes if needed Continue to work on healthy diet and regular exercise- you can do it! Call with any questions or concerns Stay Safe!  Stay Healthy!

## 2020-09-22 DIAGNOSIS — H2512 Age-related nuclear cataract, left eye: Secondary | ICD-10-CM | POA: Diagnosis not present

## 2020-09-26 DIAGNOSIS — H2512 Age-related nuclear cataract, left eye: Secondary | ICD-10-CM | POA: Diagnosis not present

## 2020-09-27 ENCOUNTER — Other Ambulatory Visit: Payer: Self-pay | Admitting: Family Medicine

## 2020-09-27 ENCOUNTER — Telehealth: Payer: Self-pay | Admitting: Neurology

## 2020-09-27 NOTE — Telephone Encounter (Signed)
Called patient and informed her [facility] should have refills on file. Patient verbalized understanding, appreciation.

## 2020-09-27 NOTE — Telephone Encounter (Signed)
Pt is needing a refill on her  gabapentin (NEURONTIN) 300 MG capsule Please advise.

## 2020-10-16 ENCOUNTER — Ambulatory Visit (INDEPENDENT_AMBULATORY_CARE_PROVIDER_SITE_OTHER)
Admission: RE | Admit: 2020-10-16 | Discharge: 2020-10-16 | Disposition: A | Discharge status: Home / Self Care | Payer: Medicare PPO | Source: Ambulatory Visit | Attending: Acute Care | Admitting: Acute Care

## 2020-10-16 ENCOUNTER — Other Ambulatory Visit: Payer: Self-pay | Admitting: Family Medicine

## 2020-10-16 ENCOUNTER — Other Ambulatory Visit: Payer: Self-pay

## 2020-10-16 DIAGNOSIS — F17210 Nicotine dependence, cigarettes, uncomplicated: Secondary | ICD-10-CM | POA: Diagnosis not present

## 2020-10-16 DIAGNOSIS — Z87891 Personal history of nicotine dependence: Secondary | ICD-10-CM

## 2020-10-21 ENCOUNTER — Other Ambulatory Visit: Payer: Self-pay | Admitting: Family Medicine

## 2020-10-25 ENCOUNTER — Encounter: Payer: Self-pay | Admitting: *Deleted

## 2020-10-25 DIAGNOSIS — Z87891 Personal history of nicotine dependence: Secondary | ICD-10-CM

## 2020-10-25 DIAGNOSIS — F17210 Nicotine dependence, cigarettes, uncomplicated: Secondary | ICD-10-CM

## 2020-10-25 NOTE — Progress Notes (Signed)
Please call patient and let them  know their  low dose Ct was read as a Lung RADS 2: nodules that are benign in appearance and behavior with a very low likelihood of becoming a clinically active cancer due to size or lack of growth. Recommendation per radiology is for a repeat LDCT in 12 months. .Please let them  know we will order and schedule their  annual screening scan for 10/2021. Please let them  know there was notation of CAD on their  scan.  Please remind the patient  that this is a non-gated exam therefore degree or severity of disease  cannot be determined. Please have them  follow up with their PCP regarding potential risk factor modification, dietary therapy or pharmacologic therapy if clinically indicated. Pt.  is  currently on statin therapy. Please place order for annual  screening scan for  10/2021 and fax results to PCP. Thanks so much. 

## 2020-10-30 ENCOUNTER — Encounter: Payer: Self-pay | Admitting: *Deleted

## 2020-10-30 ENCOUNTER — Encounter: Payer: Self-pay | Admitting: Family Medicine

## 2020-11-11 ENCOUNTER — Other Ambulatory Visit: Payer: Self-pay | Admitting: Family Medicine

## 2020-11-11 ENCOUNTER — Other Ambulatory Visit: Payer: Self-pay | Admitting: Internal Medicine

## 2020-11-11 DIAGNOSIS — G2581 Restless legs syndrome: Secondary | ICD-10-CM

## 2020-11-11 NOTE — Telephone Encounter (Signed)
Requesting:Klonopin  Contract: UDS: Last Visit:09/17/20 Next Visit:11/19/20 Last Refill:01/01/20 90 tabs 3 refills  Trazadone  Lr 05/23/20  90 tabs 1 refill  Please Advise

## 2020-11-14 ENCOUNTER — Encounter: Payer: Self-pay | Admitting: Neurology

## 2020-11-14 ENCOUNTER — Ambulatory Visit: Payer: Medicare PPO | Admitting: Neurology

## 2020-11-14 VITALS — BP 146/55 | HR 55 | Ht 62.0 in | Wt 232.0 lb

## 2020-11-14 DIAGNOSIS — M545 Low back pain, unspecified: Secondary | ICD-10-CM

## 2020-11-14 DIAGNOSIS — R202 Paresthesia of skin: Secondary | ICD-10-CM

## 2020-11-14 DIAGNOSIS — R251 Tremor, unspecified: Secondary | ICD-10-CM | POA: Diagnosis not present

## 2020-11-14 DIAGNOSIS — R3915 Urgency of urination: Secondary | ICD-10-CM | POA: Diagnosis not present

## 2020-11-14 MED ORDER — DULOXETINE HCL 30 MG PO CPEP: 30.0000 mg | ORAL_CAPSULE | Freq: Every day | ORAL | 6 refills | Status: DC

## 2020-11-14 MED ORDER — GABAPENTIN 300 MG PO CAPS: 900.0000 mg | ORAL_CAPSULE | Freq: Three times a day (TID) | ORAL | 11 refills | Status: DC

## 2020-11-14 NOTE — Patient Instructions (Signed)
Meds ordered this encounter  Medications   gabapentin (NEURONTIN) 300 MG capsule----Try this one first    Sig: Take 3 capsules (900 mg total) by mouth 3 (three) times daily.    Dispense:  270 capsule    Refill:  11      DULoxetine (CYMBALTA) 30 MG capsule--Add on one capsule 2 weeks later    Sig: Take 1 capsule (30 mg total) by mouth daily.    Dispense:  30 capsule    Refill:  6

## 2020-11-14 NOTE — Progress Notes (Addendum)
Chief Complaint  Patient presents with   Follow-up    Rm 16 alone Pt is well, still having hand and leg tremors that are about the same if not a little worse than prior visit.       ASSESSMENT AND PLAN  Lacey [name] is a 70 y.o. female  Positional related bilateral lower extremity tremor  Also long history of intermittent bilateral hands tremor,  Differentiation diagnosis include primary orthostatic tremor, in a background of essential tremor, versus diabetic neuropathy induced orthostatic blood pressure change, but multiple blood pressure measure failed to demonstrate significant drop of blood pressure  Hope higher dose of gabapentin would help,   Bilateral lower extremity paresthesia  Improved with gabapentin, will try higher dose 300 mg 3 tablets 3 times a day, also add on Cymbalta 30 mg daily  Likely peripheral neuropathy, she does has diabetes, obesity, well controlled, most recent A1c 6.7  EMG nerve conduction study  Laboratory evaluation to rule out treatable etiology  Polypharmacy, chronic insomnia  She is taking trazodone 100 mg, plus gabapentin 600 mg, plus clonazepam 1 mg for sleep now.  Advised her to decrease clonazepam to as needed, and lower dose, hold higher dose of gabapentin 900 mg can help her sleep better  Chronic low back pain,  Personally reviewed MRI of lumbar spine in April 2019, mild multilevel spondylosis, no significant canal or foraminal stenosis  Back stretching exercise  DIAGNOSTIC DATA (LABS, IMAGING, TESTING) - I reviewed patient records, labs, notes, testing and imaging myself where available.  Labs in 2022: normal CBC, LFTs, tsh, bmp, LDL 65, A1C 6.07 Sep 2020, A1C 6.7, CMP was normal,   MRI lumbar in April 2019 Mild multilevel spondylosis without significant stenosis or subarticular zone narrowing.   HISTORICAL  Lacey [name], is a 70 year old female seen in request by her primary care doctor Midge Minium, for evaluation of right  leg tremor, initial evaluation was on August 17, 2020  I reviewed and summarized the referring note.  Past medical history Hyperlipidemia Diabetes since 1999, with peripheral neuropathy Hypothyroidism Hypertension Chronic insomnia Restless leg. History of multiple kidney stone.  Patient reported intermittent right leg shaking, only happened when she holding her right leg in certain posture for extended period of time, such as when she is trying to have a pedicure, she has to hold her right leg to calm down the tremor,  She also complains of intermittent bilateral hand shaking with prolonged standing, it is often alleviated quickly by shifting weight back-and-forth between 2 legs, she denies gait abnormality,  She reported frequent hypoglycemia episode, during those spells, she would have increased bilateral lower extremity shaking, this improved with normalization of her glucose  She also complains of bilateral feet, and intermittent hands numbness tingling, feeling cold  UPDATE November 14 2020: Patient reported higher dose of gabapentin 300 mg 3 times daily increased to 600 3 times daily has helped her rest bilateral lower extremity paresthesia, also bilateral lower extremity tremor for a while, but the benefit quickly wearing off,  She has a long history of intermittent bilateral hands tremor, most noticeable when she uses computer mouse, noticeable right hand shaking, difficulty controlling her mouse  She also had long history of chronic low back pain, increased low back pain but stated midline, no radiating pain, personally reviewed MRI of lumbar in April 2019, mild multilevel degenerative changes, no evidence of significant canal and foraminal narrowing  Long history of urinary urgency, frequency, occasionally nocturnal incontinence, sees urologist  regularly, still wake up many times to use bathroom,  Difficulty sleeping, lower extremity discomfort, urge to move also contributed, carries  a diagnosis of restless leg, taking polypharmacy to help her sleep, now gabapentin 300 mg 2 tablets every night, trazodone 100 mg every night, and also clonazepam 1 mg   REVIEW OF SYSTEMS:  Full 14 system review of systems performed and notable only for as above All other review of systems were negative.  PHYSICAL EXAM:   Vitals:   11/14/20 1014  BP: (!) 146/55  Pulse: (!) 55  Weight: 232 lb (105.2 kg)  Height: _0  (1.575 m)   Blood pressure sitting down 130/50, heart rate of 64; standing up 137/53, 55; standing up for 1 minutes 145/51, 63  Body mass index is 42.43 kg/m.  PHYSICAL EXAMNIATION:  Gen: NAD, conversant, well nourised, well groomed        NEUROLOGICAL EXAM: Obese,  MENTAL STATUS: Speech/cognition: Awake, alert, oriented to history taking and casual conversation   CRANIAL NERVES: CN II: Visual fields are full to confrontation. Pupils are round equal and briskly reactive to light. CN III, IV, VI: extraocular movement are normal. No ptosis. CN V: Facial sensation is intact to light the benefit seems to wear of CN VII: Face is symmetric with normal eye closure  CN VIII: Hearing is normal to causal conversation. CN IX, X: Phonation is normal. CN XI: Head turning and shoulder shrug are intact  MOTOR: Mild bilateral hand posturing tremor, no rigidity, no bradykinesia, no weakness, tenderness of right lateral foot upon deep palpitation  REFLEXES: Reflexes are 1 and symmetric at the biceps, triceps, knees, and absent at ankles. Plantar responses are flexor.  SENSORY: Mildly decreased vibratory sensation at toes, length dependent decreased light touch pinprick to ankle level  COORDINATION: There is no trunk or limb dysmetria noted.  GAIT/STANCE: She needs push-up to get up from seated position, cautious, difficulty performing tiptoe, heel walking,  There was 1 time, when she got up, she was noted to have high-frequency low amplitude lower extremity tremor,  suggestive of orthostatic tremor,  ALLERGIES: No Known Allergies  HOME MEDICATIONS: Current Outpatient Medications  Medication Sig Dispense Refill   Alcohol Swabs (B-D SINGLE USE SWABS REGULAR) PADS USE AS DIRECTED  AS NEEDED. 200 each 3   aspirin EC 81 MG tablet Take 81 mg by mouth daily.     atorvastatin (LIPITOR) 20 MG tablet TAKE 1 TABLET BY MOUTH EVERY DAY 90 tablet 1   Blood Glucose Monitoring Suppl (TRUE METRIX METER) w/Device KIT USE AS DIRECTED 1 kit 1   clonazePAM (KLONOPIN) 1 MG tablet TAKE 1 TABLET AT BEDTIME FOR RESTLESS LEGS SYNDROME 90 tablet 1   clotrimazole-betamethasone (LOTRISONE) cream Apply 1 application topically 2 (two) times daily. 45 g 0   diclofenac Sodium (VOLTAREN) 1 % GEL APPLY 2 GRAMS TO AFFECTED AREA 4 TIMES A DAY 300 g 1   diphenoxylate-atropine (LOMOTIL) 2.5-0.025 MG per tablet Take 1 tablet by mouth 4 (four) times daily as needed for diarrhea or loose stools. 30 tablet 0   DROPLET PEN NEEDLES 31G X 8 MM MISC USE 4 TIMES DAILY 400 each 6   fenofibrate 160 MG tablet TAKE 1 TABLET BY MOUTH  DAILY 90 tablet 3   furosemide (LASIX) 20 MG tablet TAKE 1 TABLET EVERY DAY 90 tablet 3   gabapentin (NEURONTIN) 300 MG capsule Take 2 capsules (600 mg total) by mouth 3 (three) times daily. 540 capsule 3   glucose blood (TRUE  METRIX BLOOD GLUCOSE TEST) test strip Use as instructed to test blood sugar 4 times daily. Dx. E11.9 400 each 3   Lancets Super Thin 28G MISC Please use as directed to test sugars 4 times daily. Dx. E11.9 420 each 3   lansoprazole (PREVACID) 15 MG capsule Take 15 mg by mouth daily.     LANTUS SOLOSTAR 100 UNIT/ML Solostar Pen INJECT 60 UNITS INTO THE SKIN AT BEDTIME 60 mL 0   levothyroxine (SYNTHROID) 50 MCG tablet TAKE 1 TABLET BY MOUTH EVERY DAY BEFORE BREAKFAST 90 tablet 1   nitrofurantoin, macrocrystal-monohydrate, (MACROBID) 100 MG capsule      NOVOLOG FLEXPEN 100 UNIT/ML FlexPen INJECT 25 UNITS SUBCUTANEOUSLY THREE TIMES DAILY WITH MEALS 75 mL 0    ondansetron (ZOFRAN) 4 MG tablet Take 1 tablet (4 mg total) by mouth every 8 (eight) hours as needed for nausea or vomiting. 12 tablet 0   traZODone (DESYREL) 100 MG tablet TAKE 1 TABLET BY MOUTH AT  BEDTIME 90 tablet 3   valsartan (DIOVAN) 320 MG tablet TAKE 1 TABLET EVERY DAY 90 tablet 2   verapamil (CALAN-SR) 180 MG CR tablet TAKE 1 TABLET EVERY DAY (APPOINTMENT IS NEEDED FOR REFILLS) 30 tablet 0   No current facility-administered medications for this visit.    PAST MEDICAL HISTORY: Past Medical History:  Diagnosis Date   Adenomatous colon polyp    hyperplastic   Anemia    Anginal pain ([facility])    not recent   Arthritis    NECK, KNEES, FINGERS, TOES   Atrial tachycardia ([facility]) CARDIOLOGIST - DR Caryl Comes (LAST VISIT AUG 2012)   Echo 12/11: EF 55-60%, Mild LVH, grade 1 diast dysfxn;   holter 1/12: ATach   Atypical chest pain    a. 07/2004 Cath: Clean cors;  b. 04/2010 Myoview: EF 63%, no ischemia   Blood transfusion without reported diagnosis 1969   Chronic kidney disease    left kidney small    Diabetes mellitus, type 2 ([facility])    ORAL AND INSULIN MEDS (LAST A1C  7.3)   Diverticulosis    Erythema CURRENT-- CLOSED ABD. WALL ABSCESS   PER PCP NOTE (03-31-11)- MRSA-- TAKES DOXYCYCLINE   GERD (gastroesophageal reflux disease)    CONTROLLED W/ PREVACID   History of kidney stones 2012   Hyperlipidemia    Hypertension    Insomnia    TAKES MEDS PRN   Neuropathy, peripheral    Obesity (BMI 30-39.9) 02/19/2015   Pneumonia    as child   Post op infection 11/07/12   left bunionectomy   Restless leg syndrome    Sepsis, urinary HISTORY - 2004   Sleep apnea    no cpap    Thyroid disease    Tremor    Vitamin D deficiency     PAST SURGICAL HISTORY: Past Surgical History:  Procedure Laterality Date   ABDOMINAL HYSTERECTOMY  1995   ovaries remain   BUNIONECTOMY Left 10/24/12   BUNIONECTOMY Right 05/17/2017   CARDIAC CATHETERIZATION  07/10/04   CARPAL TUNNEL RELEASE  2000   RIGHT    CERVICAL FUSION  10-12-07   C5 - 7   COLONOSCOPY     CYSTOSCOPY WITH RETROGRADE PYELOGRAM, URETEROSCOPY AND STENT PLACEMENT Left 11/28/2012   Procedure: LEFT RETROGRADE PYELOGRAM, LEFT URETEROSCOPY, ;  Surgeon: Claybon Jabs, MD;  Location: [facility];  Service: Urology;  Laterality: Left;   CYSTOSCOPY/RETROGRADE/URETEROSCOPY/STONE EXTRACTION WITH BASKET  X2 2004 & 2009   LEFT    GASTRIC BYPASS  1981  KNEE ARTHROSCOPY  AUG 2012   LEFT   LAPAROSCOPIC CHOLECYSTECTOMY  2001   left thumb joint surgery  2013   PERCUTANEOUS NEPHROSTOLITHOTOMY  02-27-11   LEFT   POLYPECTOMY     RIGHT THUMB JOINT SURG.   MAY 2011   TRIGGER FINGER RELEASE  2010   RIGHT THUMB   UPPER GASTROINTESTINAL ENDOSCOPY     URETERAL STENT PLACEMENT  X2  2004  &  2009   LEFT   URETEROSCOPY  04/06/2011   Procedure: URETEROSCOPY;  Surgeon: Claybon Jabs, MD;  Location: [facility];  Service: Urology;  Laterality: Left;  L Ureteroscopy Laser Litho & Stent     FAMILY HISTORY: Family History  Problem Relation Age of Onset   Heart attack Father    Lung disease Father    Hyperlipidemia Mother    Hypertension Mother    Heart disease Sister    Hypertension Sister    Colon polyps Sister    Hypertension Sister    Colon polyps Sister    Hypertension Sister    Hypertension Sister    Lung cancer Sister 89       stage 4    Diabetes Other    Breast cancer Other    Heart disease Other    Colon cancer Other 81       nephew   Esophageal cancer Neg Hx    Rectal cancer Neg Hx    Stomach cancer Neg Hx    Amblyopia Neg Hx    Blindness Neg Hx    Cataracts Neg Hx    Glaucoma Neg Hx    Retinal detachment Neg Hx    Strabismus Neg Hx    Retinitis pigmentosa Neg Hx     SOCIAL HISTORY: Social History   Socioeconomic History   Marital status: Married    Spouse name: Not on file   Number of children: 2   Years of education: 12   Highest education level: High school graduate  Occupational History    Occupation: Engineer, materials middle school    Employer: [employer]    Comment: in office   Occupation: Retired  Tobacco Use   Smoking status: Every Day    Packs/day: 0.25    Years: 43.00    Pack years: 10.75    Types: Cigarettes   Smokeless tobacco: Never   Tobacco comments:    0.75ppd x33yr, 0.25ppd x179yr3/10/17). Counseled to quit -smoking, nicotine replacement and smoking cessation classes discussed-3-4 a day as of 09-06-15  Substance and Sexual Activity   Alcohol use: No    Alcohol/week: 0.0 standard drinks   Drug use: No   Sexual activity: Not on file  Other Topics Concern   Not on file  Social History Narrative   2 children, 2 stepchildren   Lives with husband.   Right-handed.   No daily caffeine.   Social Determinants of Health   Financial Resource Strain: Not on file  Food Insecurity: Not on file  Transportation Needs: Not on file  Physical Activity: Not on file  Stress: Not on file  Social Connections: Not on file  Intimate Partner Violence: Not on file      YiMarcial PacasM.D. Ph.D.  [facility] [address] [address]h: [phone]: [phone]:  TaMidge MiniumMD [address] [place],  [place] [id number] MiniumMD

## 2020-11-18 LAB — MULTIPLE MYELOMA PANEL, SERUM
Albumin SerPl Elph-Mcnc: 3.8 g/dL (ref 2.9–4.4)
Albumin/Glob SerPl: 1.5 (ref 0.7–1.7)
Alpha 1: 0.2 g/dL (ref 0.0–0.4)
Alpha2 Glob SerPl Elph-Mcnc: 0.6 g/dL (ref 0.4–1.0)
B-Globulin SerPl Elph-Mcnc: 1 g/dL (ref 0.7–1.3)
Gamma Glob SerPl Elph-Mcnc: 0.9 g/dL (ref 0.4–1.8)
Globulin, Total: 2.7 g/dL (ref 2.2–3.9)
IgA/Immunoglobulin A, Serum: 122 mg/dL (ref 87–352)
IgG (Immunoglobin G), Serum: 826 mg/dL (ref 586–1602)
IgM (Immunoglobulin M), Srm: 159 mg/dL (ref 26–217)
Total Protein: 6.5 g/dL (ref 6.0–8.5)

## 2020-11-18 LAB — RPR: RPR Ser Ql: NONREACTIVE

## 2020-11-18 LAB — COPPER, SERUM: Copper: 134 ug/dL (ref 80–158)

## 2020-11-18 LAB — VITAMIN D 25 HYDROXY (VIT D DEFICIENCY, FRACTURES): Vit D, 25-Hydroxy: 27.7 ng/mL — ABNORMAL LOW (ref 30.0–100.0)

## 2020-11-18 LAB — CK: Total CK: 134 U/L (ref 32–182)

## 2020-11-18 LAB — FERRITIN: Ferritin: 66 ng/mL (ref 15–150)

## 2020-11-18 LAB — ANA W/REFLEX IF POSITIVE: Anti Nuclear Antibody (ANA): NEGATIVE

## 2020-11-18 LAB — VITAMIN B12: Vitamin B-12: 298 pg/mL (ref 232–1245)

## 2020-11-18 LAB — SEDIMENTATION RATE: Sed Rate: 3 mm/hr (ref 0–40)

## 2020-11-18 LAB — C-REACTIVE PROTEIN: CRP: 3 mg/L (ref 0–10)

## 2020-11-19 ENCOUNTER — Encounter: Payer: Self-pay | Admitting: Family Medicine

## 2020-11-19 ENCOUNTER — Other Ambulatory Visit: Payer: Self-pay

## 2020-11-19 ENCOUNTER — Ambulatory Visit (INDEPENDENT_AMBULATORY_CARE_PROVIDER_SITE_OTHER): Payer: Medicare PPO | Admitting: Family Medicine

## 2020-11-19 VITALS — BP 120/70 | HR 79 | Temp 97.9°F | Resp 20 | Ht 62.0 in | Wt 233.2 lb

## 2020-11-19 DIAGNOSIS — Z794 Long term (current) use of insulin: Secondary | ICD-10-CM

## 2020-11-19 DIAGNOSIS — R202 Paresthesia of skin: Secondary | ICD-10-CM | POA: Diagnosis not present

## 2020-11-19 DIAGNOSIS — E113391 Type 2 diabetes mellitus with moderate nonproliferative diabetic retinopathy without macular edema, right eye: Secondary | ICD-10-CM

## 2020-11-19 DIAGNOSIS — E559 Vitamin D deficiency, unspecified: Secondary | ICD-10-CM | POA: Diagnosis not present

## 2020-11-19 DIAGNOSIS — I776 Arteritis, unspecified: Secondary | ICD-10-CM | POA: Diagnosis not present

## 2020-11-19 DIAGNOSIS — Z0000 Encounter for general adult medical examination without abnormal findings: Secondary | ICD-10-CM | POA: Diagnosis not present

## 2020-11-19 LAB — CBC WITH DIFFERENTIAL/PLATELET
Basophils Absolute: 0.1 10*3/uL (ref 0.0–0.1)
Basophils Relative: 1.1 % (ref 0.0–3.0)
Eosinophils Absolute: 0.2 10*3/uL (ref 0.0–0.7)
Eosinophils Relative: 2.1 % (ref 0.0–5.0)
HCT: 38.5 % (ref 36.0–46.0)
Hemoglobin: 13.1 g/dL (ref 12.0–15.0)
Lymphocytes Relative: 28.7 % (ref 12.0–46.0)
Lymphs Abs: 2.4 10*3/uL (ref 0.7–4.0)
MCHC: 33.9 g/dL (ref 30.0–36.0)
MCV: 92.9 fl (ref 78.0–100.0)
Monocytes Absolute: 0.6 10*3/uL (ref 0.1–1.0)
Monocytes Relative: 7.5 % (ref 3.0–12.0)
Neutro Abs: 5 10*3/uL (ref 1.4–7.7)
Neutrophils Relative %: 60.6 % (ref 43.0–77.0)
Platelets: 145 10*3/uL — ABNORMAL LOW (ref 150.0–400.0)
RBC: 4.14 Mil/uL (ref 3.87–5.11)
RDW: 13.9 % (ref 11.5–15.5)
WBC: 8.2 10*3/uL (ref 4.0–10.5)

## 2020-11-19 LAB — BASIC METABOLIC PANEL
BUN: 20 mg/dL (ref 6–23)
CO2: 25 mEq/L (ref 19–32)
Calcium: 9 mg/dL (ref 8.4–10.5)
Chloride: 107 mEq/L (ref 96–112)
Creatinine, Ser: 1.11 mg/dL (ref 0.40–1.20)
GFR: 50.35 mL/min — ABNORMAL LOW (ref >60.00)
Glucose, Bld: 114 mg/dL — ABNORMAL HIGH (ref 70–99)
Potassium: 4.2 mEq/L (ref 3.5–5.1)
Sodium: 140 mEq/L (ref 135–145)

## 2020-11-19 LAB — HEPATIC FUNCTION PANEL
ALT: 11 U/L (ref 0–35)
AST: 16 U/L (ref 0–37)
Albumin: 4 g/dL (ref 3.5–5.2)
Alkaline Phosphatase: 44 U/L (ref 39–117)
Bilirubin, Direct: 0.1 mg/dL (ref 0.0–0.3)
Total Bilirubin: 0.4 mg/dL (ref 0.2–1.2)
Total Protein: 6.2 g/dL (ref 6.0–8.3)

## 2020-11-19 LAB — LIPID PANEL
Cholesterol: 124 mg/dL (ref 0–200)
HDL: 40.6 mg/dL (ref >39.00)
LDL Cholesterol: 63 mg/dL (ref 0–99)
NonHDL: 83.74
Total CHOL/HDL Ratio: 3
Triglycerides: 106 mg/dL (ref 0.0–149.0)
VLDL: 21.2 mg/dL (ref 0.0–40.0)

## 2020-11-19 LAB — HEMOGLOBIN A1C: Hgb A1c MFr Bld: 6.8 % — ABNORMAL HIGH (ref 4.6–6.5)

## 2020-11-19 LAB — TSH: TSH: 2.57 u[IU]/mL (ref 0.35–5.50)

## 2020-11-19 LAB — VITAMIN D 25 HYDROXY (VIT D DEFICIENCY, FRACTURES): VITD: 30.71 ng/mL (ref 30.00–100.00)

## 2020-11-19 NOTE — Progress Notes (Signed)
Subjective:    Patient ID: Lacey [name], female    DOB: 1951-02-19, 70 y.o.   MRN: [id number]  HPI CPE- UTD on colonoscopy, mammo, eye exam.  Due for foot exam.  On ARB for renal protection.  UTD no DEXA.  Reviewed past medical, surgical, family and social histories.   Patient Care Team    Relationship Specialty Notifications Start End  Midge Minium, MD PCP - General   04/16/10   Deboraha Sprang, MD PCP - Cardiology Cardiology Admissions 01/31/18   Deboraha Sprang, MD Consulting Physician Cardiology  08/03/14   Almedia Balls, MD Consulting Physician Orthopedic Surgery  08/03/14   Dyke Maes, Frank Consulting Physician Optometry  05/01/15   Irene Shipper, MD Consulting Physician Gastroenterology  05/02/15   Ceasar Mons, MD Consulting Physician Urology  06/09/17   Martinique, Amy, MD Consulting Physician Dermatology  06/09/17   Marchia Bond, MD Consulting Physician Orthopedic Surgery  06/16/18     Health Maintenance  Topic Date Due   OPHTHALMOLOGY EXAM  12/14/2019   COVID-19 Vaccine (4 - Booster for Pfizer series) 05/30/2020   FOOT EXAM  11/14/2020   INFLUENZA VACCINE  12/02/2020   MAMMOGRAM  01/01/2021   HEMOGLOBIN A1C  03/20/2021   DEXA SCAN  01/01/2025   COLONOSCOPY (Pts 45-43yrs Insurance coverage will need to be confirmed)  09/03/2025   TETANUS/TDAP  01/24/2029   Hepatitis C Screening  Completed   PNA vac Low Risk Adult  Completed   Zoster Vaccines- Shingrix  Completed   HPV VACCINES  Aged Out      Review of Systems Patient reports no vision/ hearing changes, adenopathy,fever, weight change,  persistant/recurrent hoarseness , swallowing issues, chest pain, palpitations, edema, persistant/recurrent cough, hemoptysis, dyspnea (rest/exertional/paroxysmal nocturnal), gastrointestinal bleeding (melena, rectal bleeding), abdominal pain, significant heartburn, bowel changes, GU symptoms (dysuria, hematuria, incontinence), Gyn symptoms (abnormal  bleeding, pain),   syncope, focal weakness, memory loss, hair/nail changes, abnormal bruising or bleeding, anxiety, or depression.   + chronic paresthesias- following w/ Neuro + red rash on bilateral lower legs- nonblanching, doesn't itch or hurt.  Present x1 week.  Is improving  This visit occurred during the SARS-CoV-2 public health emergency.  Safety protocols were in place, including screening questions prior to the visit, additional usage of staff PPE, and extensive cleaning of exam room while observing appropriate contact time as indicated for disinfecting solutions.      Objective:   Physical Exam General Appearance:    Alert, cooperative, no distress, appears stated age, obese  Head:    Normocephalic, without obvious abnormality, atraumatic  Eyes:    PERRL, conjunctiva/corneas clear, EOM's intact, fundi    benign, both eyes  Ears:    Normal TM's and external ear canals, both ears  Nose:   Deferred due to COVID  Throat:   Neck:   Supple, symmetrical, trachea midline, no adenopathy;    Thyroid: no enlargement/tenderness/nodules  Back:     Symmetric, no curvature, ROM normal, no CVA tenderness  Lungs:     Clear to auscultation bilaterally, respirations unlabored  Chest Wall:    No tenderness or deformity   Heart:    Regular rate and rhythm, S1 and S2 normal, no murmur, rub   or gallop  Breast Exam:    Deferred to mammo  Abdomen:     Soft, non-tender, bowel sounds active all four quadrants,    no masses, no organomegaly  Genitalia:    Deferred  Rectal:  Extremities:   Extremities normal, atraumatic, no cyanosis or edema  Pulses:   2+ and symmetric all extremities  Skin:   Skin color, texture, turgor normal, diffuse petechiae of bilateral lower legs consistent w/ small vessel vasculitis  Lymph nodes:   Cervical, supraclavicular, and axillary nodes normal  Neurologic:   CNII-XII intact, normal strength, sensation and reflexes    throughout          Assessment & Plan:

## 2020-11-19 NOTE — Patient Instructions (Addendum)
Follow up in 3-4 months to recheck diabetes We'll notify you of your lab results and make any changes if needed Continue to work on healthy diet and regular exercise- you can do it! Take the Gabapentin as directed HOLD on the Cymbalta for now. Monitor the rash on the legs and let me know if not better within the month (small vessel vasculitis) Call with any questions or concerns Stay Safe!  Stay Healthy! Enjoy the rest of your summer!!!

## 2020-11-21 LAB — ANA: Anti Nuclear Antibody (ANA): NEGATIVE

## 2020-11-25 ENCOUNTER — Ambulatory Visit (INDEPENDENT_AMBULATORY_CARE_PROVIDER_SITE_OTHER): Payer: Medicare PPO | Admitting: *Deleted

## 2020-11-25 DIAGNOSIS — Z0000 Encounter for general adult medical examination without abnormal findings: Secondary | ICD-10-CM | POA: Diagnosis not present

## 2020-11-25 NOTE — Patient Instructions (Signed)
Lacey [name] , Thank you for taking time to come for your Medicare Wellness Visit. I appreciate your ongoing commitment to your health goals. Please review the following plan we discussed and let me know if I can assist you in the future.   Screening recommendations/referrals: Colonoscopy: up to date Mammogram: Education provided Bone Density: Education provided Recommended yearly ophthalmology/optometry visit for glaucoma screening and checkup Recommended yearly dental visit for hygiene and checkup  Vaccinations: Influenza vaccine: up to date Pneumococcal vaccine: up to date Tdap vaccine: up to date Shingles vaccine: up to date    Advanced directives: Education provided  Conditions/risks identified: NA  Next appointment: 03-03-2021 @ 10:30 [name]    12-01-2021 @ 10:30 telephone medicare annual wellness   Preventive Care 65 Years and Older, Female Preventive care refers to lifestyle choices and visits with your health care provider that can promote health and wellness. What does preventive care include? A yearly physical exam. This is also called an annual well check. Dental exams once or twice a year. Routine eye exams. Ask your health care provider how often you should have your eyes checked. Personal lifestyle choices, including: Daily care of your teeth and gums. Regular physical activity. Eating a healthy diet. Avoiding tobacco and drug use. Limiting alcohol use. Practicing safe sex. Taking low-dose aspirin every day. Taking vitamin and mineral supplements as recommended by your health care provider. What happens during an annual well check? The services and screenings done by your health care provider during your annual well check will depend on your age, overall health, lifestyle risk factors, and family history of disease. Counseling  Your health care provider may ask you questions about your: Alcohol use. Tobacco use. Drug use. Emotional well-being. Home and  relationship well-being. Sexual activity. Eating habits. History of falls. Memory and ability to understand (cognition). Work and work Statistician. Reproductive health. Screening  You may have the following tests or measurements: Height, weight, and BMI. Blood pressure. Lipid and cholesterol levels. These may be checked every 5 years, or more frequently if you are over 46 years old. Skin check. Lung cancer screening. You may have this screening every year starting at age 72 if you have a 30-pack-year history of smoking and currently smoke or have quit within the past 15 years. Fecal occult blood test (FOBT) of the stool. You may have this test every year starting at age 84. Flexible sigmoidoscopy or colonoscopy. You may have a sigmoidoscopy every 5 years or a colonoscopy every 10 years starting at age 17. Hepatitis C blood test. Hepatitis B blood test. Sexually transmitted disease (STD) testing. Diabetes screening. This is done by checking your blood sugar (glucose) after you have not eaten for a while (fasting). You may have this done every 1-3 years. Bone density scan. This is done to screen for osteoporosis. You may have this done starting at age 78. Mammogram. This may be done every 1-2 years. Talk to your health care provider about how often you should have regular mammograms. Talk with your health care provider about your test results, treatment options, and if necessary, the need for more tests. Vaccines  Your health care provider may recommend certain vaccines, such as: Influenza vaccine. This is recommended every year. Tetanus, diphtheria, and acellular pertussis (Tdap, Td) vaccine. You may need a Td booster every 10 years. Zoster vaccine. You may need this after age 74. Pneumococcal 13-valent conjugate (PCV13) vaccine. One dose is recommended after age 27. Pneumococcal polysaccharide (PPSV23) vaccine. One dose is  recommended after age 38. Talk to your health care provider  about which screenings and vaccines you need and how often you need them. This information is not intended to replace advice given to you by your health care provider. Make sure you discuss any questions you have with your health care provider. Document Released: 05/17/2015 Document Revised: 01/08/2016 Document Reviewed: 02/19/2015 Elsevier Interactive Patient Education  2017 [place] Prevention in the Home Falls can cause injuries. They can happen to people of all ages. There are many things you can do to make your home safe and to help prevent falls. What can I do on the outside of my home? Regularly fix the edges of walkways and driveways and fix any cracks. Remove anything that might make you trip as you walk through a door, such as a raised step or threshold. Trim any bushes or trees on the path to your home. Use bright outdoor lighting. Clear any walking paths of anything that might make someone trip, such as rocks or tools. Regularly check to see if handrails are loose or broken. Make sure that both sides of any steps have handrails. Any raised decks and porches should have guardrails on the edges. Have any leaves, snow, or ice cleared regularly. Use sand or salt on walking paths during winter. Clean up any spills in your garage right away. This includes oil or grease spills. What can I do in the bathroom? Use night lights. Install grab bars by the toilet and in the tub and shower. Do not use towel bars as grab bars. Use non-skid mats or decals in the tub or shower. If you need to sit down in the shower, use a plastic, non-slip stool. Keep the floor dry. Clean up any water that spills on the floor as soon as it happens. Remove soap buildup in the tub or shower regularly. Attach bath mats securely with double-sided non-slip rug tape. Do not have throw rugs and other things on the floor that can make you trip. What can I do in the bedroom? Use night lights. Make sure  that you have a light by your bed that is easy to reach. Do not use any sheets or blankets that are too big for your bed. They should not hang down onto the floor. Have a firm chair that has side arms. You can use this for support while you get dressed. Do not have throw rugs and other things on the floor that can make you trip. What can I do in the kitchen? Clean up any spills right away. Avoid walking on wet floors. Keep items that you use a lot in easy-to-reach places. If you need to reach something above you, use a strong step stool that has a grab bar. Keep electrical cords out of the way. Do not use floor polish or wax that makes floors slippery. If you must use wax, use non-skid floor wax. Do not have throw rugs and other things on the floor that can make you trip. What can I do with my stairs? Do not leave any items on the stairs. Make sure that there are handrails on both sides of the stairs and use them. Fix handrails that are broken or loose. Make sure that handrails are as long as the stairways. Check any carpeting to make sure that it is firmly attached to the stairs. Fix any carpet that is loose or worn. Avoid having throw rugs at the top or bottom of the stairs. If  you do have throw rugs, attach them to the floor with carpet tape. Make sure that you have a light switch at the top of the stairs and the bottom of the stairs. If you do not have them, ask someone to add them for you. What else can I do to help prevent falls? Wear shoes that: Do not have high heels. Have rubber bottoms. Are comfortable and fit you well. Are closed at the toe. Do not wear sandals. If you use a stepladder: Make sure that it is fully opened. Do not climb a closed stepladder. Make sure that both sides of the stepladder are locked into place. Ask someone to hold it for you, if possible. Clearly mark and make sure that you can see: Any grab bars or handrails. First and last steps. Where the edge of  each step is. Use tools that help you move around (mobility aids) if they are needed. These include: Canes. Walkers. Scooters. Crutches. Turn on the lights when you go into a dark area. Replace any light bulbs as soon as they burn out. Set up your furniture so you have a clear path. Avoid moving your furniture around. If any of your floors are uneven, fix them. If there are any pets around you, be aware of where they are. Review your medicines with your doctor. Some medicines can make you feel dizzy. This can increase your chance of falling. Ask your doctor what other things that you can do to help prevent falls. This information is not intended to replace advice given to you by your health care provider. Make sure you discuss any questions you have with your health care provider. Document Released: 02/14/2009 Document Revised: 09/26/2015 Document Reviewed: 05/25/2014 Elsevier Interactive Patient Education  2017 [employer].

## 2020-11-25 NOTE — Progress Notes (Signed)
Subjective:   Blen Ransome is a 70 y.o. female who presents for Medicare Annual (Subsequent) preventive examination.  I connected with  [name] on 11/25/20 by a telephone enabled telemedicine application and verified that I am speaking with the correct person using two identifiers.   I discussed the limitations of evaluation and management by telemedicine. The patient expressed understanding and agreed to proceed.   Review of Systems    NA Cardiac Risk Factors include: advanced age (>71mn, >>23women);diabetes mellitus;hypertension;dyslipidemia;obesity (BMI >30kg/m2)     Objective:    Today's Vitals   11/25/20 1033  PainSc: 4    There is no height or weight on file to calculate BMI.  Advanced Directives 11/25/2020 11/13/2019 06/16/2018 06/09/2017 07/08/2016 09/06/2015 10/19/2014  Does Patient Have a Medical Advance Directive? No Yes Yes Yes No No No  Type of Advance Directive - H[place]boLiving will HBrownleeLiving will Living will;Healthcare Power of Attorney - - -  Copy of [place]? - No - copy requested No - copy requested No - copy requested - - -  Would patient like information on creating a medical advance directive? No - Patient declined - - - No - Patient declined - No - patient declined information  Pre-existing out of facility DNR order (yellow form or pink MOST form) - - - - - - -    Current Medications (verified) Outpatient Encounter Medications as of 11/25/2020  Medication Sig   Alcohol Swabs (B-D SINGLE USE SWABS REGULAR) PADS USE AS DIRECTED  AS NEEDED.   aspirin EC 81 MG tablet Take 81 mg by mouth daily.   atorvastatin (LIPITOR) 20 MG tablet TAKE 1 TABLET BY MOUTH EVERY DAY   Blood Glucose Monitoring Suppl (TRUE METRIX METER) w/Device KIT USE AS DIRECTED   clonazePAM (KLONOPIN) 1 MG tablet TAKE 1 TABLET AT BEDTIME FOR RESTLESS LEGS SYNDROME   clotrimazole-betamethasone (LOTRISONE) cream Apply 1  application topically 2 (two) times daily.   diclofenac Sodium (VOLTAREN) 1 % GEL APPLY 2 GRAMS TO AFFECTED AREA 4 TIMES A DAY   diphenoxylate-atropine (LOMOTIL) 2.5-0.025 MG per tablet Take 1 tablet by mouth 4 (four) times daily as needed for diarrhea or loose stools.   fenofibrate 160 MG tablet TAKE 1 TABLET BY MOUTH  DAILY   furosemide (LASIX) 20 MG tablet TAKE 1 TABLET EVERY DAY   gabapentin (NEURONTIN) 300 MG capsule Take 3 capsules (900 mg total) by mouth 3 (three) times daily.   glucose blood (TRUE METRIX BLOOD GLUCOSE TEST) test strip Use as instructed to test blood sugar 4 times daily. Dx. E11.9   Lancets Super Thin 28G MISC Please use as directed to test sugars 4 times daily. Dx. E11.9   lansoprazole (PREVACID) 15 MG capsule Take 15 mg by mouth daily.   LANTUS SOLOSTAR 100 UNIT/ML Solostar Pen INJECT 60 UNITS INTO THE SKIN AT BEDTIME   levothyroxine (SYNTHROID) 50 MCG tablet TAKE 1 TABLET BY MOUTH EVERY DAY BEFORE BREAKFAST   nitrofurantoin, macrocrystal-monohydrate, (MACROBID) 100 MG capsule    NOVOLOG FLEXPEN 100 UNIT/ML FlexPen INJECT 25 UNITS SUBCUTANEOUSLY THREE TIMES DAILY WITH MEALS   ondansetron (ZOFRAN) 4 MG tablet Take 1 tablet (4 mg total) by mouth every 8 (eight) hours as needed for nausea or vomiting.   traZODone (DESYREL) 100 MG tablet TAKE 1 TABLET BY MOUTH AT  BEDTIME   valsartan (DIOVAN) 320 MG tablet TAKE 1 TABLET EVERY DAY   verapamil (CALAN-SR) 180 MG CR  tablet TAKE 1 TABLET EVERY DAY (APPOINTMENT IS NEEDED FOR REFILLS)   DROPLET PEN NEEDLES 31G X 8 MM MISC USE 4 TIMES DAILY   No facility-administered encounter medications on file as of 11/25/2020.    Allergies (verified) Patient has no known allergies.   History: Past Medical History:  Diagnosis Date   Adenomatous colon polyp    hyperplastic   Anemia    Anginal pain ([place])    not recent   Arthritis    NECK, KNEES, FINGERS, TOES   Atrial tachycardia ([place]) CARDIOLOGIST - DR Caryl Comes (LAST VISIT AUG 2012)    Echo 12/11: EF 55-60%, Mild LVH, grade 1 diast dysfxn;   holter 1/12: ATach   Atypical chest pain    a. 07/2004 Cath: Clean cors;  b. 04/2010 Myoview: EF 63%, no ischemia   Blood transfusion without reported diagnosis 1969   Chronic kidney disease    left kidney small    Diabetes mellitus, type 2 (HCC)    ORAL AND INSULIN MEDS (LAST A1C  7.3)   Diverticulosis    Erythema CURRENT-- CLOSED ABD. WALL ABSCESS   PER PCP NOTE (03-31-11)- MRSA-- TAKES DOXYCYCLINE   GERD (gastroesophageal reflux disease)    CONTROLLED W/ PREVACID   History of kidney stones 2012   Hyperlipidemia    Hypertension    Insomnia    TAKES MEDS PRN   Neuropathy, peripheral    Obesity (BMI 30-39.9) 02/19/2015   Pneumonia    as child   Post op infection 11/07/12   left bunionectomy   Restless leg syndrome    Sepsis, urinary HISTORY - 2004   Sleep apnea    no cpap    Thyroid disease    Tremor    Vitamin D deficiency    Past Surgical History:  Procedure Laterality Date   ABDOMINAL HYSTERECTOMY  1995   ovaries remain   BUNIONECTOMY Left 10/24/12   BUNIONECTOMY Right 05/17/2017   CARDIAC CATHETERIZATION  07/10/04   CARPAL TUNNEL RELEASE  2000   RIGHT   CERVICAL FUSION  10-12-07   C5 - 7   COLONOSCOPY     CYSTOSCOPY WITH RETROGRADE PYELOGRAM, URETEROSCOPY AND STENT PLACEMENT Left 11/28/2012   Procedure: LEFT RETROGRADE PYELOGRAM, LEFT URETEROSCOPY, ;  Surgeon: Claybon Jabs, MD;  Location: [facility];  Service: Urology;  Laterality: Left;   CYSTOSCOPY/RETROGRADE/URETEROSCOPY/STONE EXTRACTION WITH BASKET  X2 2004 & 2009   LEFT    GASTRIC BYPASS  1981   KNEE ARTHROSCOPY  AUG 2012   LEFT   LAPAROSCOPIC CHOLECYSTECTOMY  2001   left thumb joint surgery  2013   PERCUTANEOUS NEPHROSTOLITHOTOMY  02-27-11   LEFT   POLYPECTOMY     RIGHT THUMB JOINT SURG.   MAY 2011   TRIGGER FINGER RELEASE  2010   RIGHT THUMB   UPPER GASTROINTESTINAL ENDOSCOPY     URETERAL STENT PLACEMENT  X2  2004  &  2009   LEFT   URETEROSCOPY   04/06/2011   Procedure: URETEROSCOPY;  Surgeon: Claybon Jabs, MD;  Location: [facility];  Service: Urology;  Laterality: Left;  L Ureteroscopy Laser Litho & Stent    Family History  Problem Relation Age of Onset   Heart attack Father    Lung disease Father    Hyperlipidemia Mother    Hypertension Mother    Heart disease Sister    Hypertension Sister    Colon polyps Sister    Hypertension Sister    Colon polyps Sister  Hypertension Sister    Hypertension Sister    Lung cancer Sister 64       stage 4    Diabetes Other    Breast cancer Other    Heart disease Other    Colon cancer Other 74       nephew   Esophageal cancer Neg Hx    Rectal cancer Neg Hx    Stomach cancer Neg Hx    Amblyopia Neg Hx    Blindness Neg Hx    Cataracts Neg Hx    Glaucoma Neg Hx    Retinal detachment Neg Hx    Strabismus Neg Hx    Retinitis pigmentosa Neg Hx    Social History   Socioeconomic History   Marital status: Married    Spouse name: Not on file   Number of children: 2   Years of education: 12   Highest education level: High school graduate  Occupational History   Occupation: Engineer, materials middle school    Employer: [employer]    Comment: in office   Occupation: Retired  Tobacco Use   Smoking status: Every Day    Packs/day: 0.25    Years: 43.00    Pack years: 10.75    Types: Cigarettes   Smokeless tobacco: Never   Tobacco comments:    0.75ppd x4yr, 0.25ppd x14yr3/10/17). Counseled to quit -smoking, nicotine replacement and smoking cessation classes discussed-3-4 a day as of 09-06-15  Vaping Use   Vaping Use: Never used  Substance and Sexual Activity   Alcohol use: No    Alcohol/week: 0.0 standard drinks   Drug use: No   Sexual activity: Not on file  Other Topics Concern   Not on file  Social History Narrative   2 children, 2 stepchildren   Lives with husband.   Right-handed.   No daily caffeine.   Social Determinants of Health   Financial  Resource Strain: Low Risk    Difficulty of Paying Living Expenses: Not hard at all  Food Insecurity: No Food Insecurity   Worried About RuCharity fundraisern the Last Year: Never true   RaViann the Last Year: Never true  Transportation Needs: No Transportation Needs   Lack of Transportation (Medical): No   Lack of Transportation (Non-Medical): No  Physical Activity: Insufficiently Active   Days of Exercise per Week: 3 days   Minutes of Exercise per Session: 30 min  Stress: No Stress Concern Present   Feeling of Stress : Not at all  Social Connections: Socially Integrated   Frequency of Communication with Friends and Family: Twice a week   Frequency of Social Gatherings with Friends and Family: Three times a week   Attends Religious Services: More than 4 times per year   Active Member of Clubs or Organizations: No   Attends ClArchivisteetings: 1 to 4 times per year   Marital Status: Married    Tobacco Counseling Ready to quit: Not Answered Counseling given: Not Answered Tobacco comments: 0.75ppd x4338yr0.25ppd x1yr40yr10/17). Counseled to quit -smoking, nicotine replacement and smoking cessation classes discussed-3-4 a day as of 09-06-15   Clinical Intake:  Pre-visit preparation completed: Yes  Pain : 0-10 Pain Score: 4  Pain Location: Knee Pain Orientation: Left, Right Pain Descriptors / Indicators: Constant, Burning, Aching, Other (Comment) Pain Onset: More than a month ago Pain Frequency: Constant Pain Relieving Factors: nothing  Pain Relieving Factors: nothing  Nutritional Risks: None Diabetes: Yes CBG done?:  No  How often do you need to have someone help you when you read instructions, pamphlets, or other written materials from your doctor or pharmacy?: 1 - Never  Diabetic? Yes  Nutrition Risk Assessment:  Has the patient had any N/V/D within the last 2 months?  No  Does the patient have any non-healing wounds?  No  Has the patient had  any unintentional weight loss or weight gain?  No   Diabetes:  Is the patient diabetic?  Yes  If diabetic, was a CBG obtained today?  No  Did the patient bring in their glucometer from home?  No  How often do you monitor your CBG's? 4-5 times daily.   Financial Strains and Diabetes Management:  Are you having any financial strains with the device, your supplies or your medication? No .  Does the patient want to be seen by Chronic Care Management for management of their diabetes?  No  Would the patient like to be referred to a Nutritionist or for Diabetic Management?  No   Diabetic Exams:  Diabetic Eye Exam: Completed . Overdue for diabetic eye exam. Pt has been advised about the importance in completing this exam. A referral has been placed today.  Diabetic Foot Exam: Completed . Pt has been advised about the importance in completing this exam. Pt is scheduled for diabetic foot exam on [id number].   Interpreter Needed?: No  Information entered by :: Leroy Kennedy LPN   Activities of Daily Living In your present state of health, do you have any difficulty performing the following activities: 11/25/2020 11/25/2020  Hearing? N N  Vision? N N  Difficulty concentrating or making decisions? N N  Walking or climbing stairs? N N  Dressing or bathing? N N  Doing errands, shopping? N N  Preparing Food and eating ? N N  Using the Toilet? N N  In the past six months, have you accidently leaked urine? N N  Do you have problems with loss of bowel control? N N  Managing your Medications? N N  Managing your Finances? N N  Housekeeping or managing your Housekeeping? N N  Some recent data might be hidden    Patient Care Team: Midge Minium, MD as PCP - General Deboraha Sprang, MD as PCP - Cardiology (Cardiology) Deboraha Sprang, MD as Consulting Physician (Cardiology) Almedia Balls, MD as Consulting Physician (Orthopedic Surgery) Dyke Maes, [place] as Consulting Physician  (Optometry) Irene Shipper, MD as Consulting Physician (Gastroenterology) Ceasar Mons, MD as Consulting Physician (Urology) Martinique, Amy, MD as Consulting Physician (Dermatology) Marchia Bond, MD as Consulting Physician (Orthopedic Surgery)  Indicate any recent Medical Services you may have received from other than [facility] providers in the past year (date may be approximate).     Assessment:   This is a routine wellness examination for [place].  Hearing/Vision screen Hearing Screening - Comments:: No trouble hearing Vision Screening - Comments:: [facility] Dr. Genevie Ann  Up to date  Dietary issues and exercise activities discussed: Current Exercise Habits: Home exercise routine (stationary bike), Time (Minutes): 20, Frequency (Times/Week): 2, Weekly Exercise (Minutes/Week): 40, Intensity: Mild, Exercise limited by: None identified   Goals Addressed             This Visit's Progress    Patient Stated       Would like to loose some weight       Depression Screen PHQ 2/9 Scores 11/25/2020 11/19/2020 09/17/2020 06/06/2020 02/29/2020 11/15/2019 11/13/2019  PHQ -  2 Score 0 0 0 0 0 0 0  PHQ- 9 Score - 0 0 0 0 0 -  Exception Documentation - - - - - - -    Fall Risk Fall Risk  11/25/2020 11/19/2020 09/17/2020 06/06/2020 02/29/2020  Falls in the past year? 0 0 0 0 0  Number falls in past yr: 0 0 0 0 0  Injury with Fall? 0 0 0 0 0  Risk for fall due to : Mental status change No Fall Risks No Fall Risks No Fall Risks No Fall Risks  Follow up Falls evaluation completed;Falls prevention discussed - - - -    FALL RISK PREVENTION PERTAINING TO THE HOME:  Any stairs in or around the home? No  If so, are there any without handrails? No  Home free of loose throw rugs in walkways, pet beds, electrical cords, etc? Yes  Adequate lighting in your home to reduce risk of falls? Yes   ASSISTIVE DEVICES UTILIZED TO PREVENT FALLS:  Life alert? No  Use of a cane, walker or w/c? No  Grab  bars in the bathroom? Yes  Shower chair or bench in shower? Yes  Elevated toilet seat or a handicapped toilet? No   TIMED UP AND GO:  Was the test performed? No .    Cognitive Function:  Normal cognitive status assessed by direct observation by this Nurse Health Advisor. No abnormalities found.   MMSE - Mini Mental State Exam 06/16/2018 06/09/2017  Orientation to time 5 5  Orientation to Place 5 5  Registration 3 3  Attention/ Calculation 5 5  Recall 3 3  Language- name 2 objects 2 2  Language- repeat 1 1  Language- follow 3 step command 3 3  Language- read & follow direction 1 1  Write a sentence 1 1  Copy design 1 1  Total score 30 30        Immunizations Immunization History  Administered Date(s) Administered   Influenza Split 02/05/2011, 02/26/2012   Influenza Whole 02/05/2009, 01/20/2010   Influenza, High Dose Seasonal PF 01/10/2018, 12/23/2018, 01/25/2019, 01/04/2020   Influenza,inj,Quad PF,6+ Mos 01/24/2013, 01/15/2014, 01/22/2015, 01/23/2016, 12/29/2016   PFIZER(Purple Top)SARS-COV-2 Vaccination 05/29/2019, 06/19/2019, 01/29/2020   Pneumococcal Conjugate-13 04/17/2014   Pneumococcal Polysaccharide-23 06/05/2007, 02/06/2016   Tdap 06/22/2011, 12/23/2018, 01/25/2019   Zoster Recombinat (Shingrix) 07/01/2017, 09/04/2017   Zoster, Live 03/24/2012    TDAP status: Up to date  Flu Vaccine status: Up to date  Pneumococcal vaccine status: Up to date  Covid-19 vaccine status: Information provided on how to obtain vaccines.   Qualifies for Shingles Vaccine? No   Zostavax completed Yes   Shingrix Completed?: Yes  Screening Tests Health Maintenance  Topic Date Due   COVID-19 Vaccine (4 - Booster for Pfizer series) 05/30/2020   INFLUENZA VACCINE  12/02/2020   MAMMOGRAM  01/01/2021   OPHTHALMOLOGY EXAM  02/28/2021   HEMOGLOBIN A1C  05/22/2021   FOOT EXAM  11/19/2021   DEXA SCAN  01/01/2025   COLONOSCOPY (Pts 45-42yr Insurance coverage will need to be  confirmed)  09/03/2025   TETANUS/TDAP  01/24/2029   Hepatitis C Screening  Completed   PNA vac Low Risk Adult  Completed   Zoster Vaccines- Shingrix  Completed   HPV VACCINES  Aged Out    Health Maintenance  Health Maintenance Due  Topic Date Due   COVID-19 Vaccine (4 - Booster for PTahokaseries) 05/30/2020    Colorectal cancer screening: Type of screening: Colonoscopy. Completed  . Repeat every  5 years  Mammogram status: Completed  . Repeat every year  Bone Density status: Completed  . Results reflect: Bone density results: OSTEOPOROSIS. Repeat every 2 years.  Lung Cancer Screening: (Low Dose CT Chest recommended if Age 83-80 years, 30 pack-year currently smoking OR have quit w/in 15years.) does not qualify.   Lung Cancer Screening Referral:  na  Additional Screening:  Hepatitis C Screening: does not qualify; Completed   Vision Screening: Recommended annual ophthalmology exams for early detection of glaucoma and other disorders of the eye. Is the patient up to date with their annual eye exam?  Yes  Who is the provider or what is the name of the office in which the patient attends annual eye exams? [facility] If pt is not established with a provider, would they like to be referred to a provider to establish care?  established .   Dental Screening: Recommended annual dental exams for proper oral hygiene  Community Resource Referral / Chronic Care Management: CRR required this visit?  No   CCM required this visit?  No      Plan:     I have personally reviewed and noted the following in the patient's chart:   Medical and social history Use of alcohol, tobacco or illicit drugs  Current medications and supplements including opioid prescriptions.  Functional ability and status Nutritional status Physical activity Advanced directives List of other physicians Hospitalizations, surgeries, and ER visits in previous 12 months Vitals Screenings to include cognitive,  depression, and falls Referrals and appointments  In addition, I have reviewed and discussed with patient certain preventive protocols, quality metrics, and best practice recommendations. A written personalized care plan for preventive services as well as general preventive health recommendations were provided to patient.     Leroy Kennedy, LPN   [id number]   Nurse Notes: na

## 2020-12-02 ENCOUNTER — Other Ambulatory Visit: Payer: Self-pay | Admitting: Family

## 2020-12-02 ENCOUNTER — Encounter: Payer: Self-pay | Admitting: Family Medicine

## 2020-12-02 MED ORDER — BETAMETHASONE DIPROPIONATE 0.05 % EX CREA: TOPICAL_CREAM | Freq: Two times a day (BID) | CUTANEOUS | 0 refills | Status: DC

## 2020-12-08 ENCOUNTER — Other Ambulatory Visit: Payer: Self-pay | Admitting: Family Medicine

## 2020-12-18 ENCOUNTER — Encounter: Payer: Medicare PPO | Admitting: Family Medicine

## 2020-12-19 NOTE — Progress Notes (Signed)
Electrophysiology Office Note   Date:  12/20/2020   ID:  Lacey [name], DOB 17-Jan-1951, MRN [id number]  Location: patient's home  Provider location: [address] [place]  Evaluation Performed: Follow-up visit  PCP:  Lacey Minium, MD  Cardiologist:   Electrophysiologist:  SK  Is already Chief Complaint:     History of Present Illness:    Lacey [name] is a 70 y.o. female who presents for follow-up for atrial tachyycardia and HFpEF   ake is exuberant and she has pitting edema which she addresses by drinking fluids  She has no known CAD per cath 3/06.    Echocardiogram 04/30/10: EF 55-60%, mild LVH, grade 1 diastolic dysfunction.    Myoview done in 04/30/10: EF 63%, no scar, no ischemia.    Repeat scanning 6/16 was normal   Date Cr K HgbA1c  5/18  0.91 4.6 7.4   8/19 1.0 3.8 7.3  9/20 0.97 4.9 6.7  12/20 0.96 4.1   7/22 1.11 4.2 13.1      Today, she reports feeling okay overall. At home her blood pressure is stable. Lately she has not been paying attention to her heartbeat.  Infrequent palpitations  Sometimes she has chest pain in her central chest or just inferior to her breast. The pain may last for several hours. She will rub the area which helps relieve the pain. Correlating symptoms include a bad taste in her mouth.  Follows up with her PCP regarding acid reflux.  If she wakes up to use the restroom, she becomes short of breath with walking the short distance. However, she is able to lie down afterwards and fall asleep. She sleeps on a bed that is able to elevate. Recently was tested for sleep apnea, which was negative.  She endorses LE edema. Her right foot always stays swollen despite remaining compliant with Lasix. If she drinks more water, she still notices the same amount of urine. Normally takes a sip of water if her mouth feels dry.  Typically she does not avoid salt in her diet.  Currently is a smoker but has plans to quit.  The  patient denies nocturnal dyspnea, orthopnea.  There have been no  lightheadedness or syncope.     Past Medical History:  Diagnosis Date   Adenomatous colon polyp    hyperplastic   Anemia    Anginal pain ([place])    not recent   Arthritis    NECK, KNEES, FINGERS, TOES   Atrial tachycardia ([place]) CARDIOLOGIST - DR Lacey [name] (LAST VISIT AUG 2012)   Echo 12/11: EF 55-60%, Mild LVH, grade 1 diast dysfxn;   holter 1/12: ATach   Atypical chest pain    a. 07/2004 Cath: Clean cors;  b. 04/2010 Myoview: EF 63%, no ischemia   Blood transfusion without reported diagnosis 1969   Chronic kidney disease    left kidney small    Diabetes mellitus, type 2 (HCC)    ORAL AND INSULIN MEDS (LAST A1C  7.3)   Diverticulosis    Erythema CURRENT-- CLOSED ABD. WALL ABSCESS   PER PCP NOTE (03-31-11)- MRSA-- TAKES DOXYCYCLINE   GERD (gastroesophageal reflux disease)    CONTROLLED W/ PREVACID   History of kidney stones 2012   Hyperlipidemia    Hypertension    Insomnia    TAKES MEDS PRN   Neuropathy, peripheral    Obesity (BMI 30-39.9) 02/19/2015   Pneumonia    as child   Post op infection 11/07/12  left bunionectomy   Restless leg syndrome    Sepsis, urinary HISTORY - 2004   Sleep apnea    no cpap    Thyroid disease    Tremor    Vitamin D deficiency     Past Surgical History:  Procedure Laterality Date   ABDOMINAL HYSTERECTOMY  1995   ovaries remain   BUNIONECTOMY Left 10/24/12   BUNIONECTOMY Right 05/17/2017   CARDIAC CATHETERIZATION  07/10/04   CARPAL TUNNEL RELEASE  2000   RIGHT   CERVICAL FUSION  10-12-07   C5 - 7   COLONOSCOPY     CYSTOSCOPY WITH RETROGRADE PYELOGRAM, URETEROSCOPY AND STENT PLACEMENT Left 11/28/2012   Procedure: LEFT RETROGRADE PYELOGRAM, LEFT URETEROSCOPY, ;  Surgeon: Claybon Jabs, MD;  Location: [facility];  Service: Urology;  Laterality: Left;   CYSTOSCOPY/RETROGRADE/URETEROSCOPY/STONE EXTRACTION WITH BASKET  X2 2004 & 2009   LEFT    GASTRIC BYPASS  1981   KNEE ARTHROSCOPY   AUG 2012   LEFT   LAPAROSCOPIC CHOLECYSTECTOMY  2001   left thumb joint surgery  2013   PERCUTANEOUS NEPHROSTOLITHOTOMY  02-27-11   LEFT   POLYPECTOMY     RIGHT THUMB JOINT SURG.   MAY 2011   TRIGGER FINGER RELEASE  2010   RIGHT THUMB   UPPER GASTROINTESTINAL ENDOSCOPY     URETERAL STENT PLACEMENT  X2  2004  &  2009   LEFT   URETEROSCOPY  04/06/2011   Procedure: URETEROSCOPY;  Surgeon: Claybon Jabs, MD;  Location: [facility];  Service: Urology;  Laterality: Left;  L Ureteroscopy Laser Litho & Stent     Current Outpatient Medications  Medication Sig Dispense Refill   Alcohol Swabs (B-D SINGLE USE SWABS REGULAR) PADS USE AS DIRECTED  AS NEEDED. 200 each 3   aspirin EC 81 MG tablet Take 81 mg by mouth daily.     atorvastatin (LIPITOR) 20 MG tablet TAKE 1 TABLET BY MOUTH EVERY DAY 90 tablet 1   betamethasone dipropionate 0.05 % cream Apply topically 2 (two) times daily. 30 g 0   Blood Glucose Monitoring Suppl (TRUE METRIX METER) w/Device KIT USE AS DIRECTED 1 kit 1   clonazePAM (KLONOPIN) 1 MG tablet TAKE 1 TABLET AT BEDTIME FOR RESTLESS LEGS SYNDROME 90 tablet 1   clotrimazole-betamethasone (LOTRISONE) cream Apply 1 application topically 2 (two) times daily. 45 g 0   diclofenac Sodium (VOLTAREN) 1 % GEL APPLY 2 GRAMS TO AFFECTED AREA 4 TIMES A DAY 300 g 1   diphenoxylate-atropine (LOMOTIL) 2.5-0.025 MG per tablet Take 1 tablet by mouth 4 (four) times daily as needed for diarrhea or loose stools. 30 tablet 0   DROPLET PEN NEEDLES 31G X 8 MM MISC USE 4 TIMES DAILY 400 each 6   fenofibrate 160 MG tablet TAKE 1 TABLET BY MOUTH  DAILY 90 tablet 3   furosemide (LASIX) 20 MG tablet TAKE 1 TABLET EVERY DAY 90 tablet 3   gabapentin (NEURONTIN) 300 MG capsule Take 3 capsules (900 mg total) by mouth 3 (three) times daily. 270 capsule 11   glucose blood (TRUE METRIX BLOOD GLUCOSE TEST) test strip Use as instructed to test blood sugar 4 times daily. Dx. E11.9 400 each 3   Lancets  Super Thin 28G MISC Please use as directed to test sugars 4 times daily. Dx. E11.9 420 each 3   lansoprazole (PREVACID) 15 MG capsule Take 15 mg by mouth daily.     LANTUS SOLOSTAR 100 UNIT/ML Solostar Pen INJECT 60  UNITS INTO THE SKIN AT BEDTIME 60 mL 0   levothyroxine (SYNTHROID) 50 MCG tablet TAKE 1 TABLET BY MOUTH EVERY DAY BEFORE BREAKFAST 90 tablet 1   nitrofurantoin, macrocrystal-monohydrate, (MACROBID) 100 MG capsule      NOVOLOG FLEXPEN 100 UNIT/ML FlexPen INJECT 25 UNITS SUBCUTANEOUSLY THREE TIMES DAILY WITH MEALS 75 mL 0   ondansetron (ZOFRAN) 4 MG tablet Take 1 tablet (4 mg total) by mouth every 8 (eight) hours as needed for nausea or vomiting. 12 tablet 0   traZODone (DESYREL) 100 MG tablet TAKE 1 TABLET BY MOUTH AT  BEDTIME 90 tablet 3   valsartan (DIOVAN) 320 MG tablet TAKE 1 TABLET EVERY DAY 90 tablet 2   verapamil (CALAN-SR) 180 MG CR tablet TAKE 1 TABLET EVERY DAY (APPOINTMENT IS NEEDED FOR REFILLS) 30 tablet 0   No current facility-administered medications for this visit.    Allergies:   Patient has no known allergies.    Review Of Systems negative except from HPI and PMH   Exam:    Vital Signs:  BP 140/60   Pulse 63   Ht '5\' 2"'  (1.575 m)   Wt 233 lb 3.2 oz (105.8 kg)   LMP 06/06/1993   SpO2 94%   BMI 42.65 kg/m    Well developed and nourished in no acute distress HENT normal Neck supple with JVP-  7 Clear Regular rate and rhythm, no murmurs or gallops Abd-soft with active BS No Clubbing cyanosis 1+edema Skin-warm and dry A & Oriented  Grossly normal sensory and motor function  ECG sinus at 63 Intervals 14/09/37 Nonspecific ST changes     Labs/Other Tests and Data Reviewed:    Recent Labs: 11/19/2020: ALT 11; BUN 20; Creatinine, Ser 1.11; Hemoglobin 13.1; Platelets 145.0; Potassium 4.2; Sodium 140; TSH 2.57   Wt Readings from Last 3 Encounters:  12/20/20 233 lb 3.2 oz (105.8 kg)  11/19/20 233 lb 3.2 oz (105.8 kg)  11/14/20 232 lb (105.2 kg)      Other studies personally reviewed: Additional studies/ records that were reviewed today include    ASSESSMENT & PLAN:    Atrial tachycardia      HFpEF  Aortic atherosclerosis   Hypertension   Morbidly obese   Smoking  No interval atrial tachy palpitations of which she is aware.  We will continue her on verapamil 180 a day.  She is volume overloaded.  We will increase her furosemide from 20--40 mg a day.  We again discussed the importance of salt and water restriction with her heart failure.  With her vascular disease, we will continue her on a aspirin 81 and Lipitor 20  Encouraged her again to stop smoking    Current medicines are reviewed at length with the patient today.   The patient does not have concerns regarding her medicines.  The following changes were made today:  none  Labs/ tests ordered today include:  CBC and ECG  Orders Placed This Encounter  Procedures   EKG 12-Lead     Future tests ( post COVID )  ECG in 2 weeks   Patient Risk:  after full review of this patients clinical status, I feel that they are at moderat risk at this time.     I,Mathew Stumpf,acting as a scribe for Virl Axe, MD.,have documented all relevant documentation on the behalf of Virl Axe, MD,as directed by  Virl Axe, MD while in the presence of Virl Axe, MD.  I, Virl Axe, MD, have reviewed all documentation for  this visit. The documentation on 12/20/20 for the exam, diagnosis, procedures, and orders are all accurate and complete.   Signed, Virl Axe, MD  12/20/2020 10:35 AM     [facility] [address] [place] [address] [phone] (office) [phone] (fax)

## 2020-12-20 ENCOUNTER — Ambulatory Visit: Payer: Medicare PPO | Admitting: Internal Medicine

## 2020-12-20 ENCOUNTER — Other Ambulatory Visit: Payer: Self-pay

## 2020-12-20 ENCOUNTER — Encounter: Payer: Self-pay | Admitting: Internal Medicine

## 2020-12-20 VITALS — BP 140/60 | HR 63 | Ht 62.0 in | Wt 233.2 lb

## 2020-12-20 DIAGNOSIS — I951 Orthostatic hypotension: Secondary | ICD-10-CM

## 2020-12-20 MED ORDER — VERAPAMIL HCL ER 180 MG PO TBCR: 180.0000 mg | EXTENDED_RELEASE_TABLET | Freq: Every day | ORAL | 3 refills | Status: DC

## 2020-12-20 MED ORDER — FUROSEMIDE 40 MG PO TABS: 40.0000 mg | ORAL_TABLET | Freq: Every day | ORAL | 3 refills | Status: DC

## 2020-12-20 NOTE — Patient Instructions (Signed)
Medication Instructions:  Your physician has recommended you make the following change in your medication:   Stop Furosemide '20mg'$   ** begin Furosemide '40mg'$  - 1 tablet by mouth every morning.  *If you need a refill on your cardiac medications before your next appointment, please call your pharmacy*   Lab Work: None ordered.  If you have labs (blood work) drawn today and your tests are completely normal, you will receive your results only by: [place] (if you have MyChart) OR A paper copy in the mail If you have any lab test that is abnormal or we need to change your treatment, we will call you to review the results.   Testing/Procedures: None ordered.    Follow-Up: At [facility], you and your health needs are our priority.  As part of our continuing mission to provide you with exceptional heart care, we have created designated Provider Care Teams.  These Care Teams include your primary Cardiologist (physician) and Advanced Practice Providers (APPs -  Physician Assistants and Nurse Practitioners) who all work together to provide you with the care you need, when you need it.  We recommend signing up for the patient portal called "MyChart".  Sign up information is provided on this After Visit Summary.  MyChart is used to connect with patients for Virtual Visits (Telemedicine).  Patients are able to view lab/test results, encounter notes, upcoming appointments, etc.  Non-urgent messages can be sent to your provider as well.   To learn more about what you can do with MyChart, go to [email or web address].    Your next appointment:   12 month(s)  The format for your next appointment:   In Person  Provider:   Virl Axe, MD

## 2021-01-01 ENCOUNTER — Encounter (INDEPENDENT_AMBULATORY_CARE_PROVIDER_SITE_OTHER): Payer: Medicare PPO | Admitting: Neurology

## 2021-01-01 ENCOUNTER — Ambulatory Visit: Payer: Medicare PPO | Admitting: Neurology

## 2021-01-01 DIAGNOSIS — M545 Low back pain, unspecified: Secondary | ICD-10-CM

## 2021-01-01 DIAGNOSIS — R202 Paresthesia of skin: Secondary | ICD-10-CM | POA: Diagnosis not present

## 2021-01-01 DIAGNOSIS — Z0289 Encounter for other administrative examinations: Secondary | ICD-10-CM

## 2021-01-01 DIAGNOSIS — R3915 Urgency of urination: Secondary | ICD-10-CM

## 2021-01-01 DIAGNOSIS — R251 Tremor, unspecified: Secondary | ICD-10-CM | POA: Diagnosis not present

## 2021-01-01 NOTE — Progress Notes (Signed)
EMG report is under procedure 

## 2021-01-01 NOTE — Procedures (Signed)
Full Name: Lacey [name] Gender: Female MRN #: [id number] Date of Birth: 04/09/1951    Visit Date: 01/01/2021 08:59 Age: 70 Years Examining Physician: Marcial Pacas, MD  Referring Physician: Marcial Pacas, MD History: 70 years old female presented with bilateral feet numbness, intermittent upper and lower extremity tremor, especially when bearing weight, lightheadedness  Summary of the test:  Nerve conduction study: Bilateral sural, superficial peroneal sensory responses were normal.  Bilateral peroneal to EDB and tibial motor responses were normal  Electromyography: Selected needle examination of bilateral lower extremity muscles were normal.  Needle examination of the right abductor hallucis showed mildly enlarged motor unit potential.  Conclusion: This is a normal study.  There is no electrodiagnostic evidence of large fiber peripheral neuropathy    ------------------------------- Marcial Pacas, M.D. PhD  [facility] [address] [place], [place] [address] Tel: [phone] Fax: [phone]  Verbal informed consent was obtained from the patient, patient was informed of potential risk of procedure, including bruising, bleeding, hematoma formation, infection, muscle weakness, muscle pain, numbness, among others.        [place]    Nerve / Sites Muscle Latency Ref. Amplitude Ref. Rel Amp Segments Distance Velocity Ref. Area    ms ms mV mV %  cm m/s m/s mVms  R Peroneal - EDB     Ankle EDB 3.1 ?6.5 5.5 ?2.0 100 Ankle - EDB 9   15.8     Fib head EDB 8.4  3.5  63.2 Fib head - Ankle 25 47 ?44 10.6     Pop fossa EDB 10.2  4.1  117 Pop fossa - Fib head 8 44 ?44 12.3         Pop fossa - Ankle      L Peroneal - EDB     Ankle EDB 3.4 ?6.5 5.5 ?2.0 100 Ankle - EDB 9   16.3     Fib head EDB 9.0  5.1  92.8 Fib head - Ankle 25 44 ?44 15.1     Pop fossa EDB 10.8  4.8  93 Pop fossa - Fib head 8 44 ?44 14.4         Pop fossa - Ankle      R Tibial - AH     Ankle AH  2.8 ?5.8 7.4 ?4.0 100 Ankle - AH 9   18.8     Pop fossa AH 10.1  6.2  83.8 Pop fossa - Ankle 32 44 ?41 14.7  L Tibial - AH     Ankle AH 3.2 ?5.8 10.5 ?4.0 100 Ankle - AH 9   23.7     Pop fossa AH 10.4  9.2  88.2 Pop fossa - Ankle 33 46 ?41 20.7             SNC    Nerve / Sites Rec. Site Peak Lat Ref.  Amp Ref. Segments Distance    ms ms V V  cm  R Sural - Ankle (Calf)     Calf Ankle 2.5 ?4.4 7 ?6 Calf - Ankle 10  L Sural - Ankle (Calf)     Calf Ankle 2.7 ?4.4 8 ?6 Calf - Ankle 10  R Superficial peroneal - Ankle     Lat leg Ankle 2.5 ?4.4 6 ?6 Lat leg - Ankle 10  L Superficial peroneal - Ankle     Lat leg Ankle 2.5 ?4.4 6 ?6 Lat leg - Ankle 10  F  Wave    Nerve F Lat Ref.   ms ms  R Tibial - AH 54.8 ?56.0  L Tibial - AH 53.4 ?56.0         EMG Summary Table    Spontaneous MUAP Recruitment  Muscle IA Fib PSW Fasc Other Amp Dur. Poly Pattern  R. Tibialis anterior Normal None None None _______ Normal Normal Normal Normal  R. Tibialis posterior Normal None None None _______ Normal Normal Normal Normal  R. Peroneus longus Normal None None None _______ Normal Normal Normal Normal  R. Gastrocnemius (Medial head) Normal None None None _______ Normal Normal Normal Normal  R. Vastus lateralis Normal None None None _______ Normal Normal Normal Normal  L. Tibialis anterior Normal None None None _______ Normal Normal Normal Normal  L. Tibialis posterior Normal None None None _______ Normal Normal Normal Normal  L. Peroneus longus Normal None None None _______ Normal Normal Normal Normal  L. Gastrocnemius (Medial head) Normal None None None _______ Normal Normal Normal Normal  L. Vastus lateralis Normal None None None _______ Normal Normal Normal Normal  R. Abductor hallucis Normal None None None _______ Normal Normal Normal Reduced

## 2021-01-03 DIAGNOSIS — M7671 Peroneal tendinitis, right leg: Secondary | ICD-10-CM | POA: Diagnosis not present

## 2021-01-03 DIAGNOSIS — R2689 Other abnormalities of gait and mobility: Secondary | ICD-10-CM | POA: Diagnosis not present

## 2021-01-03 DIAGNOSIS — M21612 Bunion of left foot: Secondary | ICD-10-CM | POA: Diagnosis not present

## 2021-01-03 DIAGNOSIS — R609 Edema, unspecified: Secondary | ICD-10-CM | POA: Diagnosis not present

## 2021-01-03 DIAGNOSIS — M65871 Other synovitis and tenosynovitis, right ankle and foot: Secondary | ICD-10-CM | POA: Diagnosis not present

## 2021-01-07 DIAGNOSIS — Z1231 Encounter for screening mammogram for malignant neoplasm of breast: Secondary | ICD-10-CM | POA: Diagnosis not present

## 2021-01-07 LAB — HM DIABETES EYE EXAM

## 2021-01-09 ENCOUNTER — Other Ambulatory Visit: Payer: Self-pay | Admitting: Family Medicine

## 2021-01-09 ENCOUNTER — Other Ambulatory Visit: Payer: Self-pay | Admitting: Family

## 2021-01-09 NOTE — Assessment & Plan Note (Signed)
Chronic problem.  Following w/ neuro.  Check Vit D level.  Replete prn.

## 2021-01-09 NOTE — Assessment & Plan Note (Signed)
Check labs and replete prn. 

## 2021-01-09 NOTE — Assessment & Plan Note (Signed)
Chronic problem.  Hx of recent good control.  UTD on eye exam, on ARB for renal protection.  Foot exam done today.  Check labs.  Adjust meds prn

## 2021-01-09 NOTE — Assessment & Plan Note (Signed)
Pt's PE unchanged from previous w/ exception of LE rash consistent w/ vasculitis.  UTD on colonoscopy, mammo, eye exam.  UTD on immunizations.  Check labs.  Anticipatory guidance provided.

## 2021-01-27 DIAGNOSIS — D2261 Melanocytic nevi of right upper limb, including shoulder: Secondary | ICD-10-CM | POA: Diagnosis not present

## 2021-01-27 DIAGNOSIS — D2262 Melanocytic nevi of left upper limb, including shoulder: Secondary | ICD-10-CM | POA: Diagnosis not present

## 2021-01-27 DIAGNOSIS — D225 Melanocytic nevi of trunk: Secondary | ICD-10-CM | POA: Diagnosis not present

## 2021-01-27 DIAGNOSIS — D224 Melanocytic nevi of scalp and neck: Secondary | ICD-10-CM | POA: Diagnosis not present

## 2021-01-27 DIAGNOSIS — L821 Other seborrheic keratosis: Secondary | ICD-10-CM | POA: Diagnosis not present

## 2021-01-27 DIAGNOSIS — L820 Inflamed seborrheic keratosis: Secondary | ICD-10-CM | POA: Diagnosis not present

## 2021-01-27 DIAGNOSIS — L245 Irritant contact dermatitis due to other chemical products: Secondary | ICD-10-CM | POA: Diagnosis not present

## 2021-01-29 DIAGNOSIS — G8929 Other chronic pain: Secondary | ICD-10-CM | POA: Diagnosis not present

## 2021-01-29 DIAGNOSIS — R197 Diarrhea, unspecified: Secondary | ICD-10-CM | POA: Diagnosis not present

## 2021-01-29 DIAGNOSIS — I471 Supraventricular tachycardia: Secondary | ICD-10-CM | POA: Diagnosis not present

## 2021-01-29 DIAGNOSIS — I110 Hypertensive heart disease with heart failure: Secondary | ICD-10-CM | POA: Diagnosis not present

## 2021-01-29 DIAGNOSIS — I509 Heart failure, unspecified: Secondary | ICD-10-CM | POA: Diagnosis not present

## 2021-01-29 DIAGNOSIS — R32 Unspecified urinary incontinence: Secondary | ICD-10-CM | POA: Diagnosis not present

## 2021-01-29 DIAGNOSIS — Z794 Long term (current) use of insulin: Secondary | ICD-10-CM | POA: Diagnosis not present

## 2021-01-29 DIAGNOSIS — M2550 Pain in unspecified joint: Secondary | ICD-10-CM | POA: Diagnosis not present

## 2021-01-29 DIAGNOSIS — E1143 Type 2 diabetes mellitus with diabetic autonomic (poly)neuropathy: Secondary | ICD-10-CM | POA: Diagnosis not present

## 2021-01-29 DIAGNOSIS — Z6841 Body Mass Index (BMI) 40.0 and over, adult: Secondary | ICD-10-CM | POA: Diagnosis not present

## 2021-01-29 DIAGNOSIS — G603 Idiopathic progressive neuropathy: Secondary | ICD-10-CM | POA: Diagnosis not present

## 2021-01-29 DIAGNOSIS — G4700 Insomnia, unspecified: Secondary | ICD-10-CM | POA: Diagnosis not present

## 2021-01-29 DIAGNOSIS — E261 Secondary hyperaldosteronism: Secondary | ICD-10-CM | POA: Diagnosis not present

## 2021-01-29 DIAGNOSIS — K219 Gastro-esophageal reflux disease without esophagitis: Secondary | ICD-10-CM | POA: Diagnosis not present

## 2021-01-29 DIAGNOSIS — Z792 Long term (current) use of antibiotics: Secondary | ICD-10-CM | POA: Diagnosis not present

## 2021-01-29 DIAGNOSIS — Z7982 Long term (current) use of aspirin: Secondary | ICD-10-CM | POA: Diagnosis not present

## 2021-01-29 DIAGNOSIS — E039 Hypothyroidism, unspecified: Secondary | ICD-10-CM | POA: Diagnosis not present

## 2021-02-06 DIAGNOSIS — E1151 Type 2 diabetes mellitus with diabetic peripheral angiopathy without gangrene: Secondary | ICD-10-CM | POA: Diagnosis not present

## 2021-02-06 DIAGNOSIS — M21961 Unspecified acquired deformity of right lower leg: Secondary | ICD-10-CM | POA: Diagnosis not present

## 2021-02-06 DIAGNOSIS — M65871 Other synovitis and tenosynovitis, right ankle and foot: Secondary | ICD-10-CM | POA: Diagnosis not present

## 2021-02-06 DIAGNOSIS — R2689 Other abnormalities of gait and mobility: Secondary | ICD-10-CM | POA: Diagnosis not present

## 2021-02-14 ENCOUNTER — Other Ambulatory Visit: Payer: Self-pay

## 2021-02-14 MED ORDER — BETAMETHASONE DIPROPIONATE 0.05 % EX CREA: TOPICAL_CREAM | Freq: Two times a day (BID) | CUTANEOUS | 0 refills | Status: DC

## 2021-02-14 NOTE — Telephone Encounter (Signed)
Patient is requesting a refill of the following medications: Requested Prescriptions   Pending Prescriptions Disp Refills   betamethasone dipropionate 0.05 % cream [Pharmacy Med Name: BETAMETHASONE DP 0.05% CRM] 30 g 0    Sig: APPLY TOPICALLY TWICE A DAY    Date of patient request: 02/12/21 Last office visit: 11/19/20 virtual with Birdie Riddle  Date of last refill: 12/02/20 [place] Quint Last refill amount: 30 g Follow up time period per chart: 3-4 months

## 2021-02-18 ENCOUNTER — Other Ambulatory Visit: Payer: Self-pay | Admitting: Family Medicine

## 2021-02-27 DIAGNOSIS — R2689 Other abnormalities of gait and mobility: Secondary | ICD-10-CM | POA: Diagnosis not present

## 2021-02-27 DIAGNOSIS — E1151 Type 2 diabetes mellitus with diabetic peripheral angiopathy without gangrene: Secondary | ICD-10-CM | POA: Diagnosis not present

## 2021-02-27 DIAGNOSIS — M21961 Unspecified acquired deformity of right lower leg: Secondary | ICD-10-CM | POA: Diagnosis not present

## 2021-02-27 DIAGNOSIS — M65871 Other synovitis and tenosynovitis, right ankle and foot: Secondary | ICD-10-CM | POA: Diagnosis not present

## 2021-03-03 ENCOUNTER — Other Ambulatory Visit: Payer: Self-pay

## 2021-03-03 ENCOUNTER — Ambulatory Visit: Payer: Medicare PPO | Admitting: Family Medicine

## 2021-03-03 ENCOUNTER — Other Ambulatory Visit (INDEPENDENT_AMBULATORY_CARE_PROVIDER_SITE_OTHER): Payer: Medicare PPO

## 2021-03-03 ENCOUNTER — Encounter: Payer: Self-pay | Admitting: Family Medicine

## 2021-03-03 VITALS — BP 118/70 | HR 54 | Temp 97.5°F | Resp 16 | Wt 234.0 lb

## 2021-03-03 DIAGNOSIS — Z794 Long term (current) use of insulin: Secondary | ICD-10-CM

## 2021-03-03 DIAGNOSIS — M5410 Radiculopathy, site unspecified: Secondary | ICD-10-CM | POA: Diagnosis not present

## 2021-03-03 DIAGNOSIS — E113391 Type 2 diabetes mellitus with moderate nonproliferative diabetic retinopathy without macular edema, right eye: Secondary | ICD-10-CM | POA: Diagnosis not present

## 2021-03-03 DIAGNOSIS — Z720 Tobacco use: Secondary | ICD-10-CM | POA: Diagnosis not present

## 2021-03-03 LAB — BASIC METABOLIC PANEL
BUN: 18 mg/dL (ref 6–23)
CO2: 31 mEq/L (ref 19–32)
Calcium: 9.2 mg/dL (ref 8.4–10.5)
Chloride: 104 mEq/L (ref 96–112)
Creatinine, Ser: 1.14 mg/dL (ref 0.40–1.20)
GFR: 48.67 mL/min — ABNORMAL LOW (ref >60.00)
Glucose, Bld: 86 mg/dL (ref 70–99)
Potassium: 4.4 mEq/L (ref 3.5–5.1)
Sodium: 141 mEq/L (ref 135–145)

## 2021-03-03 LAB — HEMOGLOBIN A1C: Hgb A1c MFr Bld: 7.1 % — ABNORMAL HIGH (ref 4.6–6.5)

## 2021-03-03 MED ORDER — BUPROPION HCL ER (SR) 100 MG PO TB12: 100.0000 mg | ORAL_TABLET | Freq: Two times a day (BID) | ORAL | 3 refills | Status: DC

## 2021-03-03 NOTE — Assessment & Plan Note (Signed)
Chronic problem.  Encouraged her to reach out to Neurosurgeon as she is very limited by her pain.  She is agreeable to schedule a f/u

## 2021-03-03 NOTE — Assessment & Plan Note (Signed)
Pt is now struggling w/ symptomatic lows.  According to the chart we had decreased her Lantus to 60 units nightly but she was still taking 65.  Will officially decrease to 60 and decrease her Novolog to 20 units TID.  If she continues to run low she is to message me and let me know so that we can make further adjustments.  Pt expressed understanding and is in agreement w/ plan.

## 2021-03-03 NOTE — Progress Notes (Signed)
   Subjective:    Patient ID: Lacey [name], female    DOB: 01/01/1951, 70 y.o.   MRN: [id number]  HPI DM- chronic problem, on Lantus 60 units (but pt is taking 65), Novolog 25mg  TID w/ hx of good control.  Last A1C 6.8%.  UTD on foot exam, eye exam, on ARB for renal protection. Pt has been having symptomatic lows recently.  Pt has had readings of 38,44, 52, 66 this morning- jumped to 166 after OJ.    Back pain- ongoing issue for pt but worse recently.  Associated w/ nausea.  Pt feels she may have a recurrent stone but she does have known back issues.  Pain severely limits what she is able to do.  Tobacco use- pt is interested in quitting after all these years.  No hx of seizures.  Has been on Wellbutrin in the past   Review of Systems For ROS see HPI   This visit occurred during the SARS-CoV-2 public health emergency.  Safety protocols were in place, including screening questions prior to the visit, additional usage of staff PPE, and extensive cleaning of exam room while observing appropriate contact time as indicated for disinfecting solutions.      Objective:   Physical Exam Vitals reviewed.  Constitutional:      General: She is not in acute distress.    Appearance: Normal appearance. She is well-developed. She is obese. She is not ill-appearing.  HENT:     Head: Normocephalic and atraumatic.  Eyes:     Conjunctiva/sclera: Conjunctivae normal.     Pupils: Pupils are equal, round, and reactive to light.  Neck:     Thyroid: No thyromegaly.  Cardiovascular:     Rate and Rhythm: Normal rate and regular rhythm.     Pulses: Normal pulses.     Heart sounds: Normal heart sounds. No murmur heard. Pulmonary:     Effort: Pulmonary effort is normal. No respiratory distress.     Breath sounds: Normal breath sounds.  Abdominal:     General: There is no distension.     Palpations: Abdomen is soft.     Tenderness: There is no abdominal tenderness.  Musculoskeletal:     Cervical back: Normal  range of motion and neck supple.     Right lower leg: No edema.     Left lower leg: No edema.  Lymphadenopathy:     Cervical: No cervical adenopathy.  Skin:    General: Skin is warm and dry.  Neurological:     Mental Status: She is alert and oriented to person, place, and time. Mental status is at baseline.  Psychiatric:        Mood and Affect: Mood normal.        Behavior: Behavior normal.          Assessment & Plan:

## 2021-03-03 NOTE — Patient Instructions (Addendum)
Follow up in 3-4 months to recheck diabetes, BP, and cholesterol Go to [place] and get your labs done DECREASE your Lantus to 60 units nightly DECREASE your Novolog to 20 units with meals Call your back doctor and let them know how limiting the pain is IF you think you have a stone, please call Dr Lovena Neighbours START the Wellbutrin twice daily and pick a quit date 2 weeks from now Call with any questions or concerns Happy Anniversary!!!

## 2021-03-03 NOTE — Assessment & Plan Note (Signed)
Ongoing issue for pt.  She is now interested in quitting and feels that this is the right time.  Asked about medication to help her quit.  She doesn't have a hx of seizures and has been on Wellbutrin before so will restart in hopes she can quit smoking.

## 2021-03-07 ENCOUNTER — Other Ambulatory Visit: Payer: Self-pay | Admitting: Podiatry

## 2021-03-07 DIAGNOSIS — M65871 Other synovitis and tenosynovitis, right ankle and foot: Secondary | ICD-10-CM

## 2021-03-11 ENCOUNTER — Encounter: Payer: Self-pay | Admitting: Family Medicine

## 2021-03-17 ENCOUNTER — Ambulatory Visit: Payer: Medicare PPO | Admitting: Family Medicine

## 2021-03-17 ENCOUNTER — Encounter: Payer: Self-pay | Admitting: Family Medicine

## 2021-03-17 VITALS — BP 120/90 | HR 62 | Temp 97.8°F | Resp 16 | Wt 233.0 lb

## 2021-03-17 DIAGNOSIS — R251 Tremor, unspecified: Secondary | ICD-10-CM | POA: Diagnosis not present

## 2021-03-17 DIAGNOSIS — R269 Unspecified abnormalities of gait and mobility: Secondary | ICD-10-CM | POA: Insufficient documentation

## 2021-03-17 DIAGNOSIS — M21969 Unspecified acquired deformity of unspecified lower leg: Secondary | ICD-10-CM | POA: Insufficient documentation

## 2021-03-17 DIAGNOSIS — R29898 Other symptoms and signs involving the musculoskeletal system: Secondary | ICD-10-CM

## 2021-03-17 MED ORDER — PROPRANOLOL HCL ER 60 MG PO CP24: 60.0000 mg | ORAL_CAPSULE | Freq: Every day | ORAL | 3 refills | Status: DC

## 2021-03-17 NOTE — Progress Notes (Signed)
Subjective:    Patient ID: Lacey [name], female    DOB: [birth year], 70 y.o.   MRN: [id number]  HPI Tremors- pt reports she is having tremors of hands, feet, and neck/face.  These are not associated w/ low blood sugars.  Tremors predated stopping smoking 4 days ago.  Pt reports increased falls b/c her legs will tremor upon standing.  Unclear if tremor worsens w/ intended movements.  Pt reports she will tend to lean to the R when sleepy and she tends to fall to the R.  Saw Dr Krista Blue and this was discussed.  She had EMG that showed no evidence of neuropathy.  Unclear how this pertained to tremor workup.   Review of Systems For ROS see HPI   This visit occurred during the SARS-CoV-2 public health emergency.  Safety protocols were in place, including screening questions prior to the visit, additional usage of staff PPE, and extensive cleaning of exam room while observing appropriate contact time as indicated for disinfecting solutions.      Objective:   Physical Exam Vitals reviewed.  Constitutional:      General: She is not in acute distress.    Appearance: Normal appearance. She is obese. She is not ill-appearing.  HENT:     Head: Normocephalic and atraumatic.  Eyes:     Extraocular Movements: Extraocular movements intact.     Conjunctiva/sclera: Conjunctivae normal.     Pupils: Pupils are equal, round, and reactive to light.  Cardiovascular:     Rate and Rhythm: Normal rate and regular rhythm.     Pulses: Normal pulses.  Pulmonary:     Effort: Pulmonary effort is normal. No respiratory distress.     Breath sounds: Normal breath sounds. No wheezing or rales.  Musculoskeletal:     Cervical back: Neck supple. No rigidity or tenderness.  Neurological:     Mental Status: She is alert and oriented to person, place, and time.     Cranial Nerves: No cranial nerve deficit.     Motor: Weakness (of both legs upon standing) present.     Comments: Resting tremor of both hands, R>L Mild neck  tremor that appears as a subtle head bob No resting leg tremors but upon standing both legs will shake and she is unsteady on her feet  Psychiatric:        Mood and Affect: Mood normal.        Behavior: Behavior normal.        Thought Content: Thought content normal.          Assessment & Plan:   Tremor- deteriorated.  Pt has had resting tremor at previous visits but it seems to be much more frequent and severe during today's visit.  She is unable to control hands, legs, or neck.  Finds herself falling and unable to hold things still- which is interfering w/ her daily activities.  No answers from appt w/ Dr Krista Blue.  Will get second opinion w/ Dr Tat- an excellent movement specialist.  In the meantime, will start Propranolol to try and improve sxs.  Pt expressed understanding and is in agreement w/ plan.   Weakness of both legs- new.  Pt is falling fairly regularly at this point b/c her legs shake like a baby deer upon standing.  It's as if her legs can't/won't support her.  She finds that she falls to the R- possibly indicating this is the more involved side.  Normal EMG w/ Dr Krista Blue.  Given the increased  frequency and severity, I find this concerning.  Will refer to Dr Tat for complete evaluation.  Pt expressed understanding and is in agreement w/ plan.

## 2021-03-17 NOTE — Patient Instructions (Signed)
Follow up as scheduled or as needed START the Propranolol once daily to help control tremors Make sure you are changing positions slowly and allowing yourself time to adjust Please use a cane or walker for support if you have one We'll call you with your neurology appt for the tremors Call with any questions or concerns Happy Thanksgiving!!!

## 2021-03-18 ENCOUNTER — Encounter: Payer: Self-pay | Admitting: Neurology

## 2021-03-18 DIAGNOSIS — M65871 Other synovitis and tenosynovitis, right ankle and foot: Secondary | ICD-10-CM | POA: Diagnosis not present

## 2021-03-18 DIAGNOSIS — E1151 Type 2 diabetes mellitus with diabetic peripheral angiopathy without gangrene: Secondary | ICD-10-CM | POA: Diagnosis not present

## 2021-03-18 DIAGNOSIS — M21961 Unspecified acquired deformity of right lower leg: Secondary | ICD-10-CM | POA: Diagnosis not present

## 2021-03-18 DIAGNOSIS — R2689 Other abnormalities of gait and mobility: Secondary | ICD-10-CM | POA: Diagnosis not present

## 2021-03-21 NOTE — Progress Notes (Signed)
Assessment/Plan:    Tremor Has various characteristics both rest and intention.  Discussed with her that she does not meet criteria for Parkinson's or an atypical state.  I feel confident that she does not have Parkinson's disease. It is possible that this is an early atypical state, but again she does not meet the criteria for those either.  She and I discussed doing a DaTscan just to be informed of possibly what is to come.  She was agreeable to that. Mri brain will be done given gait ataxia.  Dx:  [facility] D.  She has found propranolol to be effective, especially early in the morning.  She feels it wears off.  In the future, we may just change her to immediate release and have her take it 3 times per day, with the overall dosage being the same.  2.  Gait instability  -Likely multifactorial.  She is on multiple medications that may contribute to falls, including clonazepam, trazodone, fairly high-dose gabapentin (900 mg 3 times per day).  However, as above, we are going to go ahead and do an MRI of the brain.  She does have very little arm swing.  3.  Hx of nephrolithiasis  -Drugs like topiramate and Zonegran would not be able to be used because of this for her tremor.  Subjective:   Lacey [name] was seen today in the movement disorders clinic for neurologic consultation at the request of Birdie Riddle Aundra Millet, MD. patient referred for second opinion.  Patient previously referred to [facility] neurology for evaluation of tremor.  Those notes are available to me and I have thoroughly reviewed those.  Patient first saw Dr. Krista Blue on August 15, 2020.  Complaint was right leg shaking.  Dr. Krista Blue stated that patient reported was associated with hypoglycemia.  Stated that there was no evidence of Parkinson's disease on examination.  She followed up with Dr. Krista Blue on July 14.  Stated that patient was complaining about lower extremity and hand tremor.  Stated that differential diagnosis was "primary  orthostatic tremor in background of essential tremor, versus diabetic neuropathy induced orthostatic blood pressure change, but multiple blood pressure measure failed to demonstrate significant drop of blood pressure."  Treatment was increasing the patient's gabapentin (it appears that they were treating possible peripheral neuropathy as well, although EMG done on August 31 by Dr. Krista Blue and failed to demonstrate any evidence of large fiber neuropathy).  Patient followed up with primary care on November 14, at which time second opinion was requested.  They also started propranolol ER, 60 mg daily.  That does help in the AM but seems to wear off during the late afternoon.  Tremor: Yes.     How long has it been going on? Early to mid 2020 - first noted it when going to a physician for knee injections and she had to hold the leg out for the exam/injection and R knee tremor was noted  At rest or with activation?  Activation but can happen at rest  Fam hx of tremor?  No.  Located where?  Started in R leg but now both legs and both hands; some head in "yes" direction  Affected by caffeine:  doesn't drink  Affected by alcohol:  doesn't drink  Affected by stress:  Yes.  , esp if anxious  Spills soup if on spoon:  No.  Spills glass of liquid if full:  Yes.    Affects ADL's (tying shoes, brushing teeth, etc):  notes it with  brushing teeth - "I hit my gums"  Tremor inducing meds:  No.  Other Specific Symptoms:  Voice: comes and goes per pt Postural symptoms:  Yes.    Falls?  She has fallen but she has always fallen into things that she would catch herself on Bradykinesia symptoms: difficulty regaining balance; "I don't drag my feet" Loss of smell:  No. Loss of taste:  No. Urinary Incontinence: some stress/urge incontinence Difficulty Swallowing:  Yes.  ,occ has pill dysphagia to the L side of the throat N/V:  No. Lightheaded:  No.  Syncope: No. Diplopia:  Yes.  , better if closes eye and is usually just  first thing in the AM   Last CT brain 2015 after slipping on ice.  Was neg.      ALLERGIES:  No Known Allergies  CURRENT MEDICATIONS:  Current Outpatient Medications  Medication Instructions   Alcohol Swabs (B-D SINGLE USE SWABS REGULAR) PADS USE AS DIRECTED  AS NEEDED.   aspirin EC 81 mg, Daily   atorvastatin (LIPITOR) 20 MG tablet TAKE 1 TABLET BY MOUTH EVERY DAY   betamethasone dipropionate 0.05 % cream Topical, 2 times daily   Blood Glucose Monitoring Suppl (TRUE METRIX METER) w/Device KIT USE AS DIRECTED   buPROPion ER (WELLBUTRIN SR) 100 mg, Oral, 2 times daily   cholecalciferol (VITAMIN D) 1,000 Units, Oral, Daily   clonazePAM (KLONOPIN) 1 MG tablet TAKE 1 TABLET AT BEDTIME FOR RESTLESS LEGS SYNDROME   clotrimazole-betamethasone (LOTRISONE) cream 1 application, Topical, 2 times daily   diclofenac Sodium (VOLTAREN) 1 % GEL APPLY 2 GRAMS TO AFFECTED AREA 4 TIMES A DAY   diphenoxylate-atropine (LOMOTIL) 2.5-0.025 MG per tablet 1 tablet, Oral, 4 times daily PRN   DROPLET PEN NEEDLES 31G X 8 MM MISC USE 4 TIMES DAILY   fenofibrate 160 MG tablet TAKE 1 TABLET EVERY DAY   furosemide (LASIX) 40 mg, Oral, Daily   gabapentin (NEURONTIN) 900 mg, Oral, 3 times daily   glucose blood (TRUE METRIX BLOOD GLUCOSE TEST) test strip Use as instructed to test blood sugar 4 times daily. Dx. E11.9   Lancets Super Thin 28G MISC Please use as directed to test sugars 4 times daily. Dx. E11.9   lansoprazole (PREVACID) 15 mg, Daily   Lantus SoloStar 60 Units, Subcutaneous, Daily at bedtime   levothyroxine (SYNTHROID) 50 MCG tablet TAKE 1 TABLET BY MOUTH EVERY DAY BEFORE BREAKFAST   nitrofurantoin, macrocrystal-monohydrate, (MACROBID) 100 MG capsule No dose, route, or frequency recorded.   NOVOLOG FLEXPEN 100 UNIT/ML FlexPen INJECT 25 UNITS SUBCUTANEOUSLY THREE TIMES DAILY WITH MEALS   ondansetron (ZOFRAN) 4 mg, Oral, Every 8 hours PRN   propranolol (INDERAL) 40 mg, Oral, 3 times daily   traZODone  (DESYREL) 100 MG tablet TAKE 1 TABLET BY MOUTH AT  BEDTIME   valsartan (DIOVAN) 320 MG tablet TAKE 1 TABLET EVERY DAY   verapamil (CALAN-SR) 180 mg, Oral, Daily    Objective:   PHYSICAL EXAMINATION:    VITALS:   Vitals:   03/24/21 1302  BP: 125/75  Pulse: 74  SpO2: 99%  Weight: 233 lb 9.6 oz (106 kg)  Height: '5\' 2"'  (1.575 m)    GEN:  The patient appears stated age and is in NAD. HEENT:  Normocephalic, atraumatic.  The mucous membranes are moist. The superficial temporal arteries are without ropiness or tenderness. CV:  RRR Lungs:  CTAB Neck/HEME:  There are no carotid bruits bilaterally.  Neurological examination:  Orientation: The patient is alert and oriented x3.  Cranial nerves: There is good facial symmetry.  Extraocular muscles are intact. The visual fields are full to confrontational testing. The speech is fluent and clear. Soft palate rises symmetrically and there is no tongue deviation. Hearing is intact to conversational tone. Sensation: Sensation is intact to light touch throughout (facial, trunk, extremities). Vibration is intact at the bilateral big toe. There is no extinction with double simultaneous stimulation.  Motor: Strength is 5/5 in the bilateral upper and lower extremities.   Shoulder shrug is equal and symmetric.  There is no pronator drift. Deep tendon reflexes: Deep tendon reflexes are 2/4 at the bilateral biceps, triceps, brachioradialis, patella and achilles. Plantar responses are downgoing bilaterally.  Movement examination: Tone: There is normal tone in the bilateral upper extremities.  The tone in the lower extremities is normal.  Abnormal movements: there is a rest tremor, intermittent, on the L but it doesn't increase with distraction.  LE tremor is felt more than seen.  She has no postural tremor in the UE but there is intention tremor on the R.  She has no trouble with Archimedes spirals.  She really has just a little bit of trouble pouring water  from one glass to another, only when the water is in the right hand. Coordination:  There is no decremation with RAM's, with any form of RAMS, including alternating supination and pronation of the forearm, hand opening and closing, finger taps, heel taps and toe taps. Gait and Station: The patient has mild difficulty arising out of a deep-seated chair without the use of the hands. The patient's stride length is good but she has slightly antalgic gait with the R leg appearing somewhat shorter than the L.  She has almost no arm swing bilaterally.   I have reviewed and interpreted the following labs independently   Chemistry      Component Value Date/Time   NA 141 03/03/2021 1144   K 4.4 03/03/2021 1144   CL 104 03/03/2021 1144   CO2 31 03/03/2021 1144   BUN 18 03/03/2021 1144   CREATININE 1.14 03/03/2021 1144   CREATININE 0.89 11/18/2012 1433      Component Value Date/Time   CALCIUM 9.2 03/03/2021 1144   ALKPHOS 44 11/19/2020 1321   AST 16 11/19/2020 1321   ALT 11 11/19/2020 1321   BILITOT 0.4 11/19/2020 1321      Lab Results  Component Value Date   TSH 2.57 11/19/2020   Lab Results  Component Value Date   WBC 8.2 11/19/2020   HGB 13.1 11/19/2020   HCT 38.5 11/19/2020   MCV 92.9 11/19/2020   PLT 145.0 (L) 11/19/2020      Total time spent on today's visit was 50 minutes, including both face-to-face time and nonface-to-face time.  Time included that spent on review of records (prior notes available to me/labs/imaging if pertinent), discussing treatment and goals, answering patient's questions and coordinating care.  Cc:  Midge Minium, MD

## 2021-03-24 ENCOUNTER — Other Ambulatory Visit: Payer: Self-pay

## 2021-03-24 ENCOUNTER — Ambulatory Visit: Payer: Medicare PPO | Admitting: Neurology

## 2021-03-24 ENCOUNTER — Encounter: Payer: Self-pay | Admitting: Neurology

## 2021-03-24 VITALS — BP 125/75 | HR 74 | Ht 62.0 in | Wt 233.6 lb

## 2021-03-24 DIAGNOSIS — R251 Tremor, unspecified: Secondary | ICD-10-CM

## 2021-03-24 DIAGNOSIS — R270 Ataxia, unspecified: Secondary | ICD-10-CM | POA: Diagnosis not present

## 2021-03-26 ENCOUNTER — Other Ambulatory Visit: Payer: Self-pay | Admitting: Family Medicine

## 2021-03-29 ENCOUNTER — Ambulatory Visit
Admission: RE | Admit: 2021-03-29 | Discharge: 2021-03-29 | Disposition: A | Discharge status: Home / Self Care | Payer: Medicare PPO | Source: Ambulatory Visit | Attending: Podiatry | Admitting: Podiatry

## 2021-03-29 ENCOUNTER — Other Ambulatory Visit: Payer: Self-pay

## 2021-03-29 DIAGNOSIS — M65871 Other synovitis and tenosynovitis, right ankle and foot: Secondary | ICD-10-CM

## 2021-03-29 DIAGNOSIS — M25571 Pain in right ankle and joints of right foot: Secondary | ICD-10-CM | POA: Diagnosis not present

## 2021-04-02 ENCOUNTER — Other Ambulatory Visit: Payer: Self-pay | Admitting: Family

## 2021-04-04 ENCOUNTER — Telehealth: Payer: Self-pay | Admitting: Neurology

## 2021-04-04 NOTE — Telephone Encounter (Signed)
Lacey [name] was called informed pt was scheduled

## 2021-04-04 NOTE — Telephone Encounter (Signed)
Shawn with [employer] wants to know if this pt has an appt scheduled, if so, where at  [phone] ext [phone]

## 2021-04-07 DIAGNOSIS — M21961 Unspecified acquired deformity of right lower leg: Secondary | ICD-10-CM | POA: Diagnosis not present

## 2021-04-07 DIAGNOSIS — M6788 Other specified disorders of synovium and tendon, other site: Secondary | ICD-10-CM | POA: Diagnosis not present

## 2021-04-07 DIAGNOSIS — E1151 Type 2 diabetes mellitus with diabetic peripheral angiopathy without gangrene: Secondary | ICD-10-CM | POA: Diagnosis not present

## 2021-04-07 DIAGNOSIS — R2689 Other abnormalities of gait and mobility: Secondary | ICD-10-CM | POA: Diagnosis not present

## 2021-04-07 DIAGNOSIS — M65871 Other synovitis and tenosynovitis, right ankle and foot: Secondary | ICD-10-CM | POA: Diagnosis not present

## 2021-04-08 ENCOUNTER — Ambulatory Visit: Payer: Medicare PPO | Admitting: Neurology

## 2021-04-08 ENCOUNTER — Other Ambulatory Visit: Payer: Self-pay | Admitting: Family Medicine

## 2021-04-10 ENCOUNTER — Encounter (HOSPITAL_COMMUNITY)
Admission: RE | Admit: 2021-04-10 | Discharge: 2021-04-10 | Disposition: A | Discharge status: Home / Self Care | Payer: Medicare PPO | Source: Ambulatory Visit | Attending: Neurology | Admitting: Neurology

## 2021-04-10 ENCOUNTER — Other Ambulatory Visit: Payer: Self-pay

## 2021-04-10 DIAGNOSIS — G20 Parkinson's disease: Secondary | ICD-10-CM | POA: Diagnosis not present

## 2021-04-10 DIAGNOSIS — R270 Ataxia, unspecified: Secondary | ICD-10-CM | POA: Insufficient documentation

## 2021-04-10 MED ORDER — IOFLUPANE I 123 185 MBQ/2.5ML IV SOLN
4.8000 | Freq: Once | INTRAVENOUS | Status: AC | PRN
  Administered 2021-04-10: 4.8 via INTRAVENOUS

## 2021-04-10 MED ORDER — POTASSIUM IODIDE (ANTIDOTE) 130 MG PO TABS: 130.0000 mg | ORAL_TABLET | Freq: Once | ORAL | Status: AC

## 2021-04-10 MED ORDER — POTASSIUM IODIDE (ANTIDOTE) 130 MG PO TABS
ORAL_TABLET | ORAL | Status: AC
  Administered 2021-04-10: 130 mg via ORAL
  Filled 2021-04-10: qty 1

## 2021-04-11 DIAGNOSIS — H524 Presbyopia: Secondary | ICD-10-CM | POA: Diagnosis not present

## 2021-04-11 DIAGNOSIS — E119 Type 2 diabetes mellitus without complications: Secondary | ICD-10-CM | POA: Diagnosis not present

## 2021-04-11 DIAGNOSIS — H5203 Hypermetropia, bilateral: Secondary | ICD-10-CM | POA: Diagnosis not present

## 2021-04-11 DIAGNOSIS — H52223 Regular astigmatism, bilateral: Secondary | ICD-10-CM | POA: Diagnosis not present

## 2021-04-11 LAB — HM DIABETES EYE EXAM

## 2021-04-14 DIAGNOSIS — I639 Cerebral infarction, unspecified: Secondary | ICD-10-CM | POA: Diagnosis not present

## 2021-04-15 ENCOUNTER — Telehealth: Payer: Self-pay | Admitting: Neurology

## 2021-04-15 NOTE — Telephone Encounter (Signed)
Let pt know that her MRI showed evidence of new stroke (not today new but in last few weeks).  Please get copy of CD from me from [facility].  Make sure patient has had no NEW lateralizing weakness/paresthesias b/c this was incidental finding.  Put her on schedule for 60 on Thursday to discuss

## 2021-04-15 NOTE — Progress Notes (Signed)
Assessment/Plan:    Tremor I do think tremor is a bit multifactorial, having both rest and intention components.  She finds propranolol effective.  She is not sure if she is on Inderal LA, 60 mg daily, or 40 mg daily.  It is actually in her chart as Inderal, 40 mg 3 times per day.  She states that she is definitely not on 40 mg 3 times per day.  She is fairly bradycardic today.  She is to call me back and let me know. She did have an abnormal DaTscan, albeit slightly so when I reviewed it.  She does not yet meet criteria for an atypical state, although it is my suspicion that she is more likely to develop an atypical state than idiopathic Parkinson's disease.  Time will certainly tell.  She does not meet criteria for either of them right now.  Discussed with patient that DaTscan can be abnormal up to 10 years before diagnostic criteria for clinical syndrome is met.  Would like to hold on adding more medicines right now, especially given her recent abnormal MRI. 2.  Cerebral infarction  -Was probably incidental, although she does tell me that a lot of her issues started several weeks ago.    -We discussed the diagnosis as well as pathophysiology of the disease.  We discussed signs and sx's of stroke and importance of calling 911 immediately should she have any of these.  Patient education was provided.    -Talked about stroke risk factors which include HTN, hyperlipidemia age.  Talked about those risk factors which are modifiable.  -Talked about importance of blood pressure control with a goal <130/80 mm Hg.   -Talked about importance of lipid control and proper diet.  Lipids should be managed intensively, with a goal LDL < 70 mg/dL.  Her last ones were in July, with LDL at goal.  She is on Lipitor, 20 mg daily.  -I counseled the patient on measures to reduce stroke risk, including the importance of medication compliance, risk factor control, exercise, healthy diet, and avoidance of smoking.    -She  will have a carotid ultrasound.  -She will have an echocardiogram with bubble study  -Asked her to follow-up with cardiology.  She does have a history of atrial tachycardia.  She sees Dr. Caryl Comes.  Last saw him in August, 2022.  -we will schedule PT (pt not driving right now so do in the home)  2.  Gait instability             -Likely multifactorial.  She is on multiple medications that may contribute to falls, including clonazepam, trazodone, fairly high-dose gabapentin (900 mg 3 times per day).     3.  Hx of nephrolithiasis             -Drugs like topiramate and Zonegran would not be able to be used because of this for her tremor.  Subjective:   Lacey [name] was seen today in follow up for testing results. Pt with husband who supplements hx.   Last visit, I felt confident she did not have Parkinson's disease, but felt that she could have an early, atypical state.  She did not yet meet criteria for that, but felt she could have one developing.  In that regard, we did go ahead and proceed with a DaTscan.  DaTscan was completed on April 10, 2021.  There was decreased dopamine uptake in the right stratum.  MRI brain was completed at [facility].  This  was reported to show a small subacute infarct in the right posterior limb of the internal capsule.  [address] [address] getting out of bed (getting up to urinate).  Took an hour to get up.     ALLERGIES:  No Known Allergies  CURRENT MEDICATIONS:  Outpatient Encounter Medications as of 04/17/2021  Medication Sig   Alcohol Swabs (B-D SINGLE USE SWABS REGULAR) PADS USE AS DIRECTED  AS NEEDED.   aspirin EC 81 MG tablet Take 81 mg by mouth daily.   atorvastatin (LIPITOR) 20 MG tablet TAKE 1 TABLET BY MOUTH EVERY DAY   betamethasone dipropionate 0.05 % cream APPLY TOPICALLY TWICE A DAY   Blood Glucose Monitoring Suppl (TRUE METRIX METER) w/Device KIT USE AS DIRECTED   buPROPion ER (WELLBUTRIN SR) 100 MG 12 hr tablet TAKE 1 TABLET BY MOUTH TWICE A DAY   cholecalciferol  (VITAMIN D) 25 MCG (1000 UNIT) tablet Take 1,000 Units by mouth daily.   clonazePAM (KLONOPIN) 1 MG tablet TAKE 1 TABLET AT BEDTIME FOR RESTLESS LEGS SYNDROME   clotrimazole-betamethasone (LOTRISONE) cream Apply 1 application topically 2 (two) times daily.   diclofenac Sodium (VOLTAREN) 1 % GEL APPLY 2 GRAMS TO AFFECTED AREA 4 TIMES A DAY   diphenoxylate-atropine (LOMOTIL) 2.5-0.025 MG per tablet Take 1 tablet by mouth 4 (four) times daily as needed for diarrhea or loose stools.   DROPLET PEN NEEDLES 31G X 8 MM MISC USE 4 TIMES DAILY   fenofibrate 160 MG tablet TAKE 1 TABLET EVERY DAY   furosemide (LASIX) 40 MG tablet Take 1 tablet (40 mg total) by mouth daily.   gabapentin (NEURONTIN) 300 MG capsule Take 3 capsules (900 mg total) by mouth 3 (three) times daily.   glucose blood (TRUE METRIX BLOOD GLUCOSE TEST) test strip Use as instructed to test blood sugar 4 times daily. Dx. E11.9   Lancets Super Thin 28G MISC Please use as directed to test sugars 4 times daily. Dx. E11.9   lansoprazole (PREVACID) 15 MG capsule Take 15 mg by mouth daily.   LANTUS SOLOSTAR 100 UNIT/ML Solostar Pen INJECT 60 UNITS INTO THE SKIN AT BEDTIME   levothyroxine (SYNTHROID) 50 MCG tablet TAKE 1 TABLET BY MOUTH EVERY DAY BEFORE BREAKFAST   nitrofurantoin, macrocrystal-monohydrate, (MACROBID) 100 MG capsule    NOVOLOG FLEXPEN 100 UNIT/ML FlexPen INJECT 25 UNITS SUBCUTANEOUSLY THREE TIMES DAILY WITH MEALS   ondansetron (ZOFRAN) 4 MG tablet Take 1 tablet (4 mg total) by mouth every 8 (eight) hours as needed for nausea or vomiting.   propranolol (INDERAL) 40 MG tablet Take 40 mg by mouth 3 (three) times daily.   traZODone (DESYREL) 100 MG tablet TAKE 1 TABLET BY MOUTH AT  BEDTIME   valsartan (DIOVAN) 320 MG tablet TAKE 1 TABLET EVERY DAY   verapamil (CALAN-SR) 180 MG CR tablet Take 1 tablet (180 mg total) by mouth daily.   No facility-administered encounter medications on file as of 04/17/2021.    Objective:   PHYSICAL  EXAMINATION:    VITALS:   Vitals:   04/17/21 0953  BP: (!) 173/74  Pulse: (!) 51  SpO2: 94%  Weight: 225 lb 6.4 oz (102.2 kg)  Height: '5\' 3"'  (1.6 m)    GEN:  The patient appears stated age and is in NAD. HEENT:  Normocephalic, atraumatic.  The mucous membranes are moist. The superficial temporal arteries are without ropiness or tenderness. CV:  RRR Lungs:  CTAB Neck/HEME:  There are no carotid bruits bilaterally.   Neurological examination:   Orientation: The  patient is alert and oriented x3.  Cranial nerves: There is good facial symmetry.  Extraocular muscles are intact. The visual fields are full to confrontational testing. The speech is fluent and clear. Soft palate rises symmetrically and there is no tongue deviation. Hearing is intact to conversational tone. Sensation: Sensation is intact to light touch throughout (facial, trunk, extremities). Vibration is intact at the bilateral big toe. There is no extinction with double simultaneous stimulation.  Motor: Strength is 5/5 in the bilateral upper and lower extremities.   Shoulder shrug is equal and symmetric.  There is no pronator drift.    Movement examination: Tone: There is normal tone in the bilateral upper extremities.  The tone in the lower extremities is normal.  Abnormal movements: there is a rest tremor, intermittent, on the L but it doesn't increase with distraction.  LE tremor is felt more than seen.  She has no postural tremor in the UE but there is intention tremor on the R.  She has no trouble with Archimedes spirals.   Coordination:  There is no decremation with RAM's, with any form of RAMS, including alternating supination and pronation of the forearm, hand opening and closing, finger taps, heel taps and toe taps. Gait and Station: The patient has mild difficulty arising out of a deep-seated chair without the use of the hands. The patient's stride length is good but she has slightly antalgic gait with the R leg  appearing somewhat shorter than the L.  She has almost no arm swing bilaterally.  she leans to the right when she sits.  She has some trouble in the turn.  I have reviewed and interpreted the following labs independently    Chemistry      Component Value Date/Time   NA 141 03/03/2021 1144   K 4.4 03/03/2021 1144   CL 104 03/03/2021 1144   CO2 31 03/03/2021 1144   BUN 18 03/03/2021 1144   CREATININE 1.14 03/03/2021 1144   CREATININE 0.89 11/18/2012 1433      Component Value Date/Time   CALCIUM 9.2 03/03/2021 1144   ALKPHOS 44 11/19/2020 1321   AST 16 11/19/2020 1321   ALT 11 11/19/2020 1321   BILITOT 0.4 11/19/2020 1321       Lab Results  Component Value Date   WBC 8.2 11/19/2020   HGB 13.1 11/19/2020   HCT 38.5 11/19/2020   MCV 92.9 11/19/2020   PLT 145.0 (L) 11/19/2020    Lab Results  Component Value Date   TSH 2.57 11/19/2020   Lab Results  Component Value Date   CHOL 124 11/19/2020   HDL 40.60 11/19/2020   LDLCALC 63 11/19/2020   LDLDIRECT 86.0 05/02/2015   TRIG 106.0 11/19/2020   CHOLHDL 3 11/19/2020   Lab Results  Component Value Date   HGBA1C 7.1 (H) 03/03/2021     Total time spent on today's visit was 45 minutes, including both face-to-face time and nonface-to-face time.  Time included that spent on review of records (prior notes available to me/labs/imaging if pertinent), discussing treatment and goals, answering patient's questions and coordinating care.  Cc:  Midge Minium, MD

## 2021-04-15 NOTE — Telephone Encounter (Signed)
Pt is sch for 04-17-21 with Tat at 9:45

## 2021-04-16 NOTE — Telephone Encounter (Signed)
Called [facility] and they mailed CD I asked if they could send by Courier since patient will be in the office tomorrow

## 2021-04-16 NOTE — Telephone Encounter (Signed)
Called [facility] and LVM with the records dept that I needed CD from patients recent MRI and that the patient is scheduled to see Dr. Carles Collet tomorrow at 9:45 so if I could get that CD sent over with a courier. I left office number and fax number in case they need any further information

## 2021-04-17 ENCOUNTER — Encounter: Payer: Self-pay | Admitting: Neurology

## 2021-04-17 ENCOUNTER — Other Ambulatory Visit: Payer: Self-pay

## 2021-04-17 ENCOUNTER — Ambulatory Visit: Payer: Medicare PPO | Admitting: Neurology

## 2021-04-17 VITALS — BP 173/74 | HR 51 | Ht 63.0 in | Wt 225.4 lb

## 2021-04-17 DIAGNOSIS — M545 Low back pain, unspecified: Secondary | ICD-10-CM

## 2021-04-17 DIAGNOSIS — R251 Tremor, unspecified: Secondary | ICD-10-CM

## 2021-04-17 DIAGNOSIS — R270 Ataxia, unspecified: Secondary | ICD-10-CM | POA: Diagnosis not present

## 2021-04-17 DIAGNOSIS — R202 Paresthesia of skin: Secondary | ICD-10-CM | POA: Diagnosis not present

## 2021-04-17 DIAGNOSIS — I471 Supraventricular tachycardia: Secondary | ICD-10-CM

## 2021-04-17 DIAGNOSIS — I63311 Cerebral infarction due to thrombosis of right middle cerebral artery: Secondary | ICD-10-CM | POA: Diagnosis not present

## 2021-04-17 MED ORDER — CLOPIDOGREL BISULFATE 75 MG PO TABS: 75.0000 mg | ORAL_TABLET | Freq: Every day | ORAL | 0 refills | Status: DC

## 2021-04-17 NOTE — Patient Instructions (Addendum)
We will schedule carotid u/s We will schedule echocardiogram Start plavix.  You will take this with aspirin only for 3 months.  You will then stop the plavix.   We will schedule PT Call me with your dosage of propranolol at home Make appointment with Dr. Caryl Comes  Get yourself some exercise (would love to have physical trainer to help)

## 2021-04-18 ENCOUNTER — Encounter: Payer: Self-pay | Admitting: Family Medicine

## 2021-04-18 ENCOUNTER — Telehealth: Payer: Self-pay

## 2021-04-18 MED ORDER — PROPRANOLOL HCL 60 MG PO TABS: 60.0000 mg | ORAL_TABLET | Freq: Every day | ORAL | Status: DC

## 2021-04-18 NOTE — Telephone Encounter (Signed)
Medication updated

## 2021-04-21 ENCOUNTER — Telehealth: Payer: Self-pay

## 2021-04-21 NOTE — Telephone Encounter (Signed)
January 3rd at 9:00 am at [facility] cardiology, entrance 2 [phone] valid 04/21/2021-05/21/2021, approval [id number]. Patient advised, order placed.

## 2021-04-22 ENCOUNTER — Ambulatory Visit
Admission: RE | Admit: 2021-04-22 | Discharge: 2021-04-22 | Disposition: A | Discharge status: Home / Self Care | Payer: Medicare PPO | Source: Ambulatory Visit | Attending: Neurology | Admitting: Neurology

## 2021-04-22 DIAGNOSIS — E785 Hyperlipidemia, unspecified: Secondary | ICD-10-CM | POA: Diagnosis not present

## 2021-04-22 DIAGNOSIS — I10 Essential (primary) hypertension: Secondary | ICD-10-CM | POA: Diagnosis not present

## 2021-04-22 DIAGNOSIS — I63311 Cerebral infarction due to thrombosis of right middle cerebral artery: Secondary | ICD-10-CM

## 2021-04-22 DIAGNOSIS — Z8673 Personal history of transient ischemic attack (TIA), and cerebral infarction without residual deficits: Secondary | ICD-10-CM | POA: Diagnosis not present

## 2021-04-22 DIAGNOSIS — E119 Type 2 diabetes mellitus without complications: Secondary | ICD-10-CM | POA: Diagnosis not present

## 2021-04-23 ENCOUNTER — Other Ambulatory Visit: Payer: Self-pay | Admitting: Family Medicine

## 2021-04-23 DIAGNOSIS — R1084 Generalized abdominal pain: Secondary | ICD-10-CM | POA: Diagnosis not present

## 2021-04-23 DIAGNOSIS — N210 Calculus in bladder: Secondary | ICD-10-CM | POA: Diagnosis not present

## 2021-04-23 DIAGNOSIS — R110 Nausea: Secondary | ICD-10-CM | POA: Diagnosis not present

## 2021-04-23 DIAGNOSIS — N261 Atrophy of kidney (terminal): Secondary | ICD-10-CM | POA: Diagnosis not present

## 2021-04-23 DIAGNOSIS — N200 Calculus of kidney: Secondary | ICD-10-CM | POA: Diagnosis not present

## 2021-04-23 DIAGNOSIS — N3020 Other chronic cystitis without hematuria: Secondary | ICD-10-CM | POA: Diagnosis not present

## 2021-04-23 DIAGNOSIS — R8279 Other abnormal findings on microbiological examination of urine: Secondary | ICD-10-CM | POA: Diagnosis not present

## 2021-04-23 DIAGNOSIS — N3941 Urge incontinence: Secondary | ICD-10-CM | POA: Diagnosis not present

## 2021-05-06 ENCOUNTER — Ambulatory Visit (HOSPITAL_COMMUNITY)
Admission: RE | Admit: 2021-05-06 | Discharge: 2021-05-06 | Disposition: A | Discharge status: Home / Self Care | Payer: Medicare PPO | Source: Ambulatory Visit | Attending: Neurology | Admitting: Neurology

## 2021-05-06 ENCOUNTER — Other Ambulatory Visit: Payer: Self-pay

## 2021-05-06 DIAGNOSIS — E119 Type 2 diabetes mellitus without complications: Secondary | ICD-10-CM | POA: Diagnosis not present

## 2021-05-06 DIAGNOSIS — I471 Supraventricular tachycardia: Secondary | ICD-10-CM | POA: Insufficient documentation

## 2021-05-06 DIAGNOSIS — G4730 Sleep apnea, unspecified: Secondary | ICD-10-CM | POA: Diagnosis not present

## 2021-05-06 DIAGNOSIS — E785 Hyperlipidemia, unspecified: Secondary | ICD-10-CM | POA: Diagnosis not present

## 2021-05-06 DIAGNOSIS — I517 Cardiomegaly: Secondary | ICD-10-CM | POA: Insufficient documentation

## 2021-05-06 DIAGNOSIS — I10 Essential (primary) hypertension: Secondary | ICD-10-CM | POA: Insufficient documentation

## 2021-05-06 LAB — ECHOCARDIOGRAM COMPLETE BUBBLE STUDY
Area-P 1/2: 2.66 cm2
Calc EF: 54.5 %
S' Lateral: 4.3 cm
Single Plane A2C EF: 54.9 %
Single Plane A4C EF: 52.8 %

## 2021-05-06 NOTE — Progress Notes (Signed)
°  Echocardiogram 2D Echocardiogram has been performed.  Lacey [name] 05/06/2021, 9:58 AM

## 2021-05-27 ENCOUNTER — Other Ambulatory Visit: Payer: Self-pay

## 2021-05-27 ENCOUNTER — Encounter: Payer: Self-pay | Admitting: Family Medicine

## 2021-05-27 DIAGNOSIS — G2581 Restless legs syndrome: Secondary | ICD-10-CM

## 2021-05-27 MED ORDER — CLONAZEPAM 1 MG PO TABS: ORAL_TABLET | ORAL | 1 refills | Status: DC

## 2021-06-10 ENCOUNTER — Other Ambulatory Visit: Payer: Self-pay | Admitting: Family Medicine

## 2021-06-19 ENCOUNTER — Ambulatory Visit: Payer: Medicare PPO | Admitting: Neurology

## 2021-06-20 ENCOUNTER — Telehealth: Payer: Self-pay | Admitting: Internal Medicine

## 2021-06-20 ENCOUNTER — Ambulatory Visit: Payer: Medicare PPO | Admitting: Family Medicine

## 2021-06-20 ENCOUNTER — Encounter: Payer: Self-pay | Admitting: Family Medicine

## 2021-06-20 VITALS — BP 110/60 | HR 38 | Temp 97.9°F | Resp 16 | Wt 223.0 lb

## 2021-06-20 DIAGNOSIS — K439 Ventral hernia without obstruction or gangrene: Secondary | ICD-10-CM | POA: Diagnosis not present

## 2021-06-20 DIAGNOSIS — Z794 Long term (current) use of insulin: Secondary | ICD-10-CM

## 2021-06-20 DIAGNOSIS — R001 Bradycardia, unspecified: Secondary | ICD-10-CM | POA: Diagnosis not present

## 2021-06-20 DIAGNOSIS — E113391 Type 2 diabetes mellitus with moderate nonproliferative diabetic retinopathy without macular edema, right eye: Secondary | ICD-10-CM

## 2021-06-20 DIAGNOSIS — R0789 Other chest pain: Secondary | ICD-10-CM | POA: Diagnosis not present

## 2021-06-20 DIAGNOSIS — E785 Hyperlipidemia, unspecified: Secondary | ICD-10-CM | POA: Diagnosis not present

## 2021-06-20 DIAGNOSIS — E559 Vitamin D deficiency, unspecified: Secondary | ICD-10-CM | POA: Diagnosis not present

## 2021-06-20 DIAGNOSIS — I110 Hypertensive heart disease with heart failure: Secondary | ICD-10-CM

## 2021-06-20 DIAGNOSIS — E1169 Type 2 diabetes mellitus with other specified complication: Secondary | ICD-10-CM | POA: Diagnosis not present

## 2021-06-20 DIAGNOSIS — R0602 Shortness of breath: Secondary | ICD-10-CM | POA: Diagnosis not present

## 2021-06-20 LAB — BASIC METABOLIC PANEL
BUN: 18 mg/dL (ref 6–23)
CO2: 28 mEq/L (ref 19–32)
Calcium: 9.3 mg/dL (ref 8.4–10.5)
Chloride: 106 mEq/L (ref 96–112)
Creatinine, Ser: 1.07 mg/dL (ref 0.40–1.20)
GFR: 52.41 mL/min — ABNORMAL LOW (ref >60.00)
Glucose, Bld: 60 mg/dL — ABNORMAL LOW (ref 70–99)
Potassium: 4 mEq/L (ref 3.5–5.1)
Sodium: 140 mEq/L (ref 135–145)

## 2021-06-20 LAB — CBC WITH DIFFERENTIAL/PLATELET
Basophils Absolute: 0.1 10*3/uL (ref 0.0–0.1)
Basophils Relative: 0.6 % (ref 0.0–3.0)
Eosinophils Absolute: 0.2 10*3/uL (ref 0.0–0.7)
Eosinophils Relative: 2.2 % (ref 0.0–5.0)
HCT: 44 % (ref 36.0–46.0)
Hemoglobin: 14.5 g/dL (ref 12.0–15.0)
Lymphocytes Relative: 29.9 % (ref 12.0–46.0)
Lymphs Abs: 3.1 10*3/uL (ref 0.7–4.0)
MCHC: 33 g/dL (ref 30.0–36.0)
MCV: 93.4 fl (ref 78.0–100.0)
Monocytes Absolute: 0.7 10*3/uL (ref 0.1–1.0)
Monocytes Relative: 7.1 % (ref 3.0–12.0)
Neutro Abs: 6.2 10*3/uL (ref 1.4–7.7)
Neutrophils Relative %: 60.2 % (ref 43.0–77.0)
Platelets: 162 10*3/uL (ref 150.0–400.0)
RBC: 4.71 Mil/uL (ref 3.87–5.11)
RDW: 14 % (ref 11.5–15.5)
WBC: 10.3 10*3/uL (ref 4.0–10.5)

## 2021-06-20 LAB — TSH: TSH: 2.73 u[IU]/mL (ref 0.35–5.50)

## 2021-06-20 LAB — HEMOGLOBIN A1C: Hgb A1c MFr Bld: 7.4 % — ABNORMAL HIGH (ref 4.6–6.5)

## 2021-06-20 LAB — HEPATIC FUNCTION PANEL
ALT: 7 U/L (ref 0–35)
AST: 13 U/L (ref 0–37)
Albumin: 4.3 g/dL (ref 3.5–5.2)
Alkaline Phosphatase: 47 U/L (ref 39–117)
Bilirubin, Direct: 0.1 mg/dL (ref 0.0–0.3)
Total Bilirubin: 0.5 mg/dL (ref 0.2–1.2)
Total Protein: 6.5 g/dL (ref 6.0–8.3)

## 2021-06-20 LAB — LIPID PANEL
Cholesterol: 139 mg/dL (ref 0–200)
HDL: 41.9 mg/dL (ref >39.00)
LDL Cholesterol: 72 mg/dL (ref 0–99)
NonHDL: 96.81
Total CHOL/HDL Ratio: 3
Triglycerides: 124 mg/dL (ref 0.0–149.0)
VLDL: 24.8 mg/dL (ref 0.0–40.0)

## 2021-06-20 LAB — VITAMIN D 25 HYDROXY (VIT D DEFICIENCY, FRACTURES): VITD: 29.61 ng/mL — ABNORMAL LOW (ref 30.00–100.00)

## 2021-06-20 MED ORDER — NOVOLOG FLEXPEN 100 UNIT/ML ~~LOC~~ SOPN: PEN_INJECTOR | SUBCUTANEOUS | 0 refills | Status: DC

## 2021-06-20 NOTE — Progress Notes (Signed)
° °  Subjective:    Patient ID: Lacey [name], female    DOB: Aug 08, 1950, 71 y.o.   MRN: [id number]  HPI DM- chronic problem, on Lantus 60 units nightly, Novolog 25 units TID.  Pt denies symptomatic lows.  HTN- chronic problem, on Verapamil 180mg  daily, Valsartan 320mg  daily, Propranolol 60mg  daily, Lasix 40mg  daily.  Since starting Propranolol, pt's HR has been low.  + fatigue.  Pt reports intermittent CP, + SOB.  Hyperlipidemia- chronic problem.  On Lipitor 20mg  daily, Fenofibrate 160mg  daily.  Denies abd pain w/ exception of hernia, N/V.   Review of Systems For ROS see HPI   This visit occurred during the SARS-CoV-2 public health emergency.  Safety protocols were in place, including screening questions prior to the visit, additional usage of staff PPE, and extensive cleaning of exam room while observing appropriate contact time as indicated for disinfecting solutions.      Objective:   Physical Exam Vitals reviewed.  Constitutional:      General: She is not in acute distress.    Appearance: Normal appearance. She is well-developed. She is obese. She is not ill-appearing.  HENT:     Head: Normocephalic and atraumatic.  Eyes:     Conjunctiva/sclera: Conjunctivae normal.     Pupils: Pupils are equal, round, and reactive to light.  Neck:     Thyroid: No thyromegaly.  Cardiovascular:     Rate and Rhythm: Regular rhythm. Bradycardia present.     Heart sounds: Normal heart sounds. No murmur heard. Pulmonary:     Effort: Pulmonary effort is normal. No respiratory distress.     Breath sounds: Normal breath sounds.  Abdominal:     General: There is no distension.     Palpations: Abdomen is soft. There is mass (soft tissue mass that seems to have associated muscle wall defect).     Tenderness: There is no abdominal tenderness.  Musculoskeletal:     Cervical back: Normal range of motion and neck supple.     Right lower leg: No edema.     Left lower leg: No edema.  Lymphadenopathy:      Cervical: No cervical adenopathy.  Skin:    General: Skin is warm and dry.  Neurological:     General: No focal deficit present.     Mental Status: She is alert and oriented to person, place, and time.  Psychiatric:        Mood and Affect: Mood normal.        Behavior: Behavior normal.          Assessment & Plan:  SOB- pt reports sxs have been worsening.  Unclear if this is cardiac related- referral placed- or a true lung issue as she is a chronic smoker.  Referral to pulmonary for complete workup placed.  Pt expressed understanding and is in agreement w/ plan.   Abd hernia- new.  Pt reports she had an abscess in this location previously but this doesn't feel similar.  States she only feels this when standing and it seems to disappear when she is lying down.  There seems to be an underlying muscle defect.  Refer to surgery for complete evaluation.  Pt expressed understanding and is in agreement w/ plan.

## 2021-06-20 NOTE — Patient Instructions (Signed)
Follow up in 2-3 weeks to recheck pulse We'll notify you of your lab results and make any changes if needed STOP the propranolol b/c of the low heart rate We'll call you with your Pulmonary, Cardiology, and Surgery appts No insulin changes at this time Call with any questions or concerns Hang in there!!!

## 2021-06-20 NOTE — Telephone Encounter (Signed)
STAT if HR is under 50 or over 120 (normal HR is 60-100 beats per minute)  What is your heart rate? 41  Do you have a log of your heart rate readings (document readings)? no  Do you have any other symptoms? fatigued

## 2021-06-20 NOTE — Telephone Encounter (Signed)
Spoke with pt who states she saw her PCP this morning and HR in the office was 38.  Pt reports current HR at home is 48.  Pt was advised to stop Propranolol.  She has already taken medication today.  Labs were completed today at PCP's office.  Pt denies current CP, SOB, dizziness or fainting.  Pt is scheduled to see Dr Irish Lack 07/03/2021 for new CP and Dr Birdie Riddle 07/10/2021 to recheck HR.  Pt will begin holding Propranolol and continue to monitor HR at home. Pt advised will forward to Dr Caryl Comes for review and further recommendation.  Reviewed ED precautions.  Pt verbalizes understanding and agrees with current plan.

## 2021-06-24 ENCOUNTER — Encounter: Payer: Self-pay | Admitting: Family Medicine

## 2021-06-24 MED ORDER — DROPLET PEN NEEDLES 31G X 8 MM MISC: 6 refills | Status: DC

## 2021-06-24 NOTE — Assessment & Plan Note (Signed)
Check labs and replete prn. 

## 2021-06-24 NOTE — Assessment & Plan Note (Signed)
Chronic problem.  Pt feels that the adjustments we made w/ her insulin at last visit have dramatically improved things and she is no longer having symptomatic lows.  Check labs.  Adjust meds prn

## 2021-06-24 NOTE — Assessment & Plan Note (Signed)
Pt has hx of this.  Has had normal cardiac workups in the past but this has been years ago.  Given her multiple cardiac risk factors, I feel it would be wise to have another workup.  Pt expressed understanding and is in agreement w/ plan.

## 2021-06-24 NOTE — Assessment & Plan Note (Signed)
New.  Noted on exam today.  Pt states she is asymptomatic but HR in the 30s is very low.  Will hold the Propranolol that neuro started for her tremor.  Pt expressed understanding and is in agreement w/ plan.

## 2021-06-24 NOTE — Assessment & Plan Note (Signed)
Pt's BMI is 39.5 but given her other medical conditions- DM, HTN, hyperlipidemia- this qualifies as morbid obesity.  Encouraged low carb diet as exercise is limited.  Will continue to follow.

## 2021-06-24 NOTE — Assessment & Plan Note (Signed)
Chronic problem.  BP is well controlled today on Verapamil, Valsartan, Propranolol, and Lasix.  Given her bradycardia, will stop Propranolol.  Check labs due to diuretic and ARB but no other anticipated med changes.

## 2021-06-24 NOTE — Assessment & Plan Note (Signed)
Chronic problem.  Tolerating Lipitor and Fenofibrate w/o difficulty.  Check labs.  Adjust meds prn

## 2021-06-25 NOTE — Telephone Encounter (Signed)
Spoke with pt who reports HR since stopping Propranolol has been in the 50's at rest and 60's when she is active. Pt denies additional symptoms at this time.  Advised will forward to Dr Caryl Comes for review and any additional recommendations. Pt will continue to monitor HR and thanked RN for the call.

## 2021-06-26 ENCOUNTER — Other Ambulatory Visit: Payer: Self-pay | Admitting: Family Medicine

## 2021-06-27 DIAGNOSIS — N3020 Other chronic cystitis without hematuria: Secondary | ICD-10-CM | POA: Diagnosis not present

## 2021-06-27 DIAGNOSIS — R1084 Generalized abdominal pain: Secondary | ICD-10-CM | POA: Diagnosis not present

## 2021-06-27 DIAGNOSIS — N261 Atrophy of kidney (terminal): Secondary | ICD-10-CM | POA: Diagnosis not present

## 2021-07-03 ENCOUNTER — Ambulatory Visit: Payer: Medicare PPO | Admitting: Interventional Cardiology

## 2021-07-03 ENCOUNTER — Other Ambulatory Visit: Payer: Self-pay

## 2021-07-03 ENCOUNTER — Encounter: Payer: Self-pay | Admitting: Interventional Cardiology

## 2021-07-03 VITALS — BP 146/72 | HR 63 | Ht 62.0 in | Wt 223.0 lb

## 2021-07-03 DIAGNOSIS — E782 Mixed hyperlipidemia: Secondary | ICD-10-CM

## 2021-07-03 DIAGNOSIS — I639 Cerebral infarction, unspecified: Secondary | ICD-10-CM

## 2021-07-03 DIAGNOSIS — R072 Precordial pain: Secondary | ICD-10-CM

## 2021-07-03 DIAGNOSIS — E1159 Type 2 diabetes mellitus with other circulatory complications: Secondary | ICD-10-CM | POA: Diagnosis not present

## 2021-07-03 DIAGNOSIS — Z794 Long term (current) use of insulin: Secondary | ICD-10-CM | POA: Diagnosis not present

## 2021-07-03 MED ORDER — METOPROLOL TARTRATE 25 MG PO TABS: ORAL_TABLET | ORAL | 0 refills | Status: DC

## 2021-07-03 NOTE — Progress Notes (Signed)
Cardiology Office Note   Date:  07/03/2021   ID:  Lacey [name], DOB 24-Sep-1950, MRN [id number]  PCP:  Midge Minium, MD    Chief Complaint  Patient presents with   Establish Care     Wt Readings from Last 3 Encounters:  07/03/21 223 lb (101.2 kg)  06/20/21 223 lb (101.2 kg)  04/17/21 225 lb 6.4 oz (102.2 kg)       History of Present Illness: Lacey [name] is a 71 y.o. female who is being seen today for the evaluation of chest pain at the request of Midge Minium, MD.  Records show: "She has no known CAD per cath 3/06.    Echocardiogram 04/30/10: EF 55-60%, mild LVH, grade 1 diastolic dysfunction.    Myoview done in 04/30/10: EF 63%, no scar, no ischemia.    Repeat scanning 6/16 was normal."  Left chest pain that goes to the shoulder. Sx can last minutes to hours. Sx are about 1x/month.  Carotid Doppler negative in 04/2021.  Echo in Jan 2023: "Left ventricular ejection fraction, by estimation, is 60 to 65%. The  left ventricle has normal function. The left ventricle has no regional  wall motion abnormalities. There is mild left ventricular hypertrophy.  Left ventricular diastolic parameters  are consistent with Grade II diastolic dysfunction (pseudonormalization).   2. Right ventricular systolic function is normal. The right ventricular  size is normal. There is mildly elevated pulmonary artery systolic  pressure.   3. The mitral valve is normal in structure. Trivial mitral valve  regurgitation.   4. The aortic valve was not well visualized. Aortic valve regurgitation  is not visualized. "  Has some chest pain.  Left chest to the left shoulder, lasting a few minutes up to hours at  a time.  Not necessarily related to exertion.  Denies : Dizziness. Leg edema. Nitroglycerin use. Orthopnea. Palpitations. Paroxysmal nocturnal dyspnea. Shortness of breath. Syncope.    Past Medical History:  Diagnosis Date   Adenomatous colon polyp    hyperplastic    Anemia    Anginal pain ([place])    not recent   Arthritis    NECK, KNEES, FINGERS, TOES   Atrial tachycardia ([place]) CARDIOLOGIST - DR Caryl Comes (LAST VISIT AUG 2012)   Echo 12/11: EF 55-60%, Mild LVH, grade 1 diast dysfxn;   holter 1/12: ATach   Atypical chest pain    a. 07/2004 Cath: Clean cors;  b. 04/2010 Myoview: EF 63%, no ischemia   Blood transfusion without reported diagnosis 1969   Chronic kidney disease    left kidney small    Diabetes mellitus, type 2 (HCC)    ORAL AND INSULIN MEDS (LAST A1C  7.3)   Diverticulosis    Erythema CURRENT-- CLOSED ABD. WALL ABSCESS   PER PCP NOTE (03-31-11)- MRSA-- TAKES DOXYCYCLINE   GERD (gastroesophageal reflux disease)    CONTROLLED W/ PREVACID   History of kidney stones 2012   Hyperlipidemia    Hypertension    Insomnia    TAKES MEDS PRN   Neuropathy, peripheral    Obesity (BMI 30-39.9) 02/19/2015   Pneumonia    as child   Post op infection 11/07/12   left bunionectomy   Restless leg syndrome    Sepsis, urinary HISTORY - 2004   Sleep apnea    no cpap    Thyroid disease    Tremor    Vitamin D deficiency     Past Surgical History:  Procedure Laterality Date  ABDOMINAL HYSTERECTOMY  1995   ovaries remain   BUNIONECTOMY Left 10/24/2012   BUNIONECTOMY Right 05/17/2017   CARDIAC CATHETERIZATION  07/10/2004   CARPAL TUNNEL RELEASE  2000   RIGHT   CATARACT EXTRACTION, BILATERAL     CERVICAL FUSION  10/12/2007   C5 - 7   COLONOSCOPY     CYSTOSCOPY WITH RETROGRADE PYELOGRAM, URETEROSCOPY AND STENT PLACEMENT Left 11/28/2012   Procedure: LEFT RETROGRADE PYELOGRAM, LEFT URETEROSCOPY, ;  Surgeon: Claybon Jabs, MD;  Location: [facility];  Service: Urology;  Laterality: Left;   CYSTOSCOPY/RETROGRADE/URETEROSCOPY/STONE EXTRACTION WITH BASKET  X2 2004 & 2009   LEFT    GASTRIC BYPASS  1981   KNEE ARTHROSCOPY  12/2010   LEFT   LAPAROSCOPIC CHOLECYSTECTOMY  2001   left thumb joint surgery  2013   PERCUTANEOUS NEPHROSTOLITHOTOMY  02/27/2011    LEFT   POLYPECTOMY     RIGHT THUMB JOINT SURG.  09/2009   TRIGGER FINGER RELEASE  2010   RIGHT THUMB   UPPER GASTROINTESTINAL ENDOSCOPY     URETERAL STENT PLACEMENT  X2  2004  &  2009   LEFT   URETEROSCOPY  04/06/2011   Procedure: URETEROSCOPY;  Surgeon: Claybon Jabs, MD;  Location: [facility];  Service: Urology;  Laterality: Left;  L Ureteroscopy Laser Litho & Stent      Current Outpatient Medications  Medication Sig Dispense Refill   Alcohol Swabs (B-D SINGLE USE SWABS REGULAR) PADS USE AS DIRECTED  AS NEEDED. 200 each 3   aspirin EC 81 MG tablet Take 81 mg by mouth daily.     atorvastatin (LIPITOR) 20 MG tablet TAKE 1 TABLET BY MOUTH EVERY DAY 90 tablet 1   Blood Glucose Monitoring Suppl (TRUE METRIX METER) w/Device KIT USE AS DIRECTED 1 kit 1   cholecalciferol (VITAMIN D) 25 MCG (1000 UNIT) tablet Take 1,000 Units by mouth daily.     clonazePAM (KLONOPIN) 1 MG tablet TAKE 1 TABLET AT BEDTIME FOR RESTLESS LEGS SYNDROME 90 tablet 1   clopidogrel (PLAVIX) 75 MG tablet Take 1 tablet (75 mg total) by mouth daily. 90 tablet 0   diclofenac Sodium (VOLTAREN) 1 % GEL APPLY 2 GRAMS TO AFFECTED AREA 4 TIMES A DAY 300 g 1   diphenoxylate-atropine (LOMOTIL) 2.5-0.025 MG per tablet Take 1 tablet by mouth 4 (four) times daily as needed for diarrhea or loose stools. 30 tablet 0   fenofibrate 160 MG tablet TAKE 1 TABLET EVERY DAY 90 tablet 3   furosemide (LASIX) 40 MG tablet Take 1 tablet (40 mg total) by mouth daily. (Patient taking differently: Take 20 mg by mouth daily.) 90 tablet 3   gabapentin (NEURONTIN) 300 MG capsule Take 3 capsules (900 mg total) by mouth 3 (three) times daily. 270 capsule 11   glucose blood (TRUE METRIX BLOOD GLUCOSE TEST) test strip Use as instructed to test blood sugar 4 times daily. Dx. E11.9 400 each 3   insulin aspart (NOVOLOG FLEXPEN) 100 UNIT/ML FlexPen INJECT 25 UNITS SUBCUTANEOUSLY THREE TIMES DAILY WITH MEALS 75 mL 0   Insulin Pen Needle  (DROPLET PEN NEEDLES) 31G X 8 MM MISC USE 4 TIMES DAILY 400 each 6   Lancets Super Thin 28G MISC Please use as directed to test sugars 4 times daily. Dx. E11.9 420 each 3   lansoprazole (PREVACID) 15 MG capsule Take 15 mg by mouth daily.     LANTUS SOLOSTAR 100 UNIT/ML Solostar Pen INJECT 60 UNITS INTO THE SKIN AT BEDTIME 60  mL 0   levothyroxine (SYNTHROID) 50 MCG tablet TAKE 1 TABLET BY MOUTH EVERY DAY BEFORE BREAKFAST 90 tablet 1   ondansetron (ZOFRAN) 4 MG tablet Take 1 tablet (4 mg total) by mouth every 8 (eight) hours as needed for nausea or vomiting. 12 tablet 0   traZODone (DESYREL) 100 MG tablet TAKE 1 TABLET BY MOUTH AT  BEDTIME 90 tablet 3   valsartan (DIOVAN) 320 MG tablet TAKE 1 TABLET EVERY DAY 90 tablet 2   verapamil (CALAN-SR) 180 MG CR tablet Take 1 tablet (180 mg total) by mouth daily. 90 tablet 3   MYRBETRIQ 50 MG TB24 tablet Take 1 tablet by mouth daily.     No current facility-administered medications for this visit.    Allergies:   Patient has no known allergies.    Social History:  The patient  reports that she has been smoking cigarettes. She has a 10.75 pack-year smoking history. She has never used smokeless tobacco. She reports that she does not drink alcohol and does not use drugs.   Family History:  The patient's family history includes Breast cancer in an other family member; Colon cancer (age of onset: 74) in an other family member; Colon polyps in her sister and sister; Diabetes in an other family member; Heart attack in her father; Heart disease in her sister and another family member; Hyperlipidemia in her mother; Hypertension in her mother, sister, sister, sister, and sister; Lung cancer (age of onset: 30) in her sister; Lung disease in her father.    ROS:  Please see the history of present illness.   Otherwise, review of systems are positive for lack of exercise.   All other systems are reviewed and negative.    PHYSICAL EXAM: VS:  BP (!) 146/72    Pulse 63     Ht _0  (1.575 m)    Wt 223 lb (101.2 kg)    LMP 06/06/1993    SpO2 96%    BMI 40.79 kg/m  , BMI Body mass index is 40.79 kg/m. GEN: Well nourished, well developed, in no acute distress HEENT: normal Neck: no JVD, carotid bruits, or masses Cardiac: RRR; no murmurs, rubs, or gallops,no edema  Respiratory:  clear to auscultation bilaterally, normal work of breathing GI: soft, nontender, nondistended, + BS MS: no deformity or atrophy Skin: warm and dry, no rash Neuro:  Strength and sensation are intact Psych: euthymic mood, full affect   EKG:   The ekg ordered today demonstrates NSR, PAC, no ST changes   Recent Labs: 06/20/2021: ALT 7; BUN 18; Creatinine, Ser 1.07; Hemoglobin 14.5; Platelets 162.0; Potassium 4.0; Sodium 140; TSH 2.73   Lipid Panel    Component Value Date/Time   CHOL 139 06/20/2021 1114   TRIG 124.0 06/20/2021 1114   HDL 41.90 06/20/2021 1114   CHOLHDL 3 06/20/2021 1114   VLDL 24.8 06/20/2021 1114   LDLCALC 72 06/20/2021 1114   LDLDIRECT 86.0 05/02/2015 1045     Other studies Reviewed: Additional studies/ records that were reviewed today with results demonstrating: labs reviewed.   ASSESSMENT AND PLAN:  Chest pain:  Had a CVA and is now on DAPT with Plavix. Plan for CTA to eval for CAD.  On verapamil.  HR is typically in the 60s.  60 bpm on ECG today.  Plan for metoprolol 25 mg x 1 prior to CTA.  DM: A1C 7.4.  On insulin.  Whole food, plant-based diet.  Lots of fiber intake.  Avoid processed foods.  Avoid concentrated sweets.  Exercise target as noted below. Hyperlipidemia: LDL 72 and February 2023.  Continue atorvastatin. We spoke about the importance of healthy lifestyle to try to improve her overall health, help her lose weight and help her get off of medications long-term.  She has exercise equipment at home but does not use it.   Current medicines are reviewed at length with the patient today.  The patient concerns regarding her medicines were  addressed.  The following changes have been made:  No change  Labs/ tests ordered today include:  No orders of the defined types were placed in this encounter.   Recommend 150 minutes/week of aerobic exercise Low fat, low carb, high fiber diet recommended  Disposition:   FU for CTA   Signed, Larae Grooms, MD  07/03/2021 3:54 PM    [facility] [address], [place], [place]  [address] Phone: [phone]; Fax: [phone]

## 2021-07-03 NOTE — Patient Instructions (Addendum)
Medication Instructions:  ?Your physician recommends that you continue on your current medications as directed. Please refer to the Current Medication list given to you today. ? ?*If you need a refill on your cardiac medications before your next appointment, please call your pharmacy* ? ? ?Lab Work: ?Lab work to be done today--BMP ?If you have labs (blood work) drawn today and your tests are completely normal, you will receive your results only by: ?MyChart Message (if you have MyChart) OR ?A paper copy in the mail ?If you have any lab test that is abnormal or we need to change your treatment, we will call you to review the results. ? ? ?Testing/Procedures: ?Your physician has requested that you have cardiac CT. Cardiac computed tomography (CT) is a painless test that uses an x-ray machine to take clear, detailed pictures of your heart. For further information please visit [email or web address]. Please follow instruction sheet as given. ? ? ? ? ?Follow-Up: ?At [facility], you and your health needs are our priority.  As part of our continuing mission to provide you with exceptional heart care, we have created designated Provider Care Teams.  These Care Teams include your primary Cardiologist (physician) and Advanced Practice Providers (APPs -  Physician Assistants and Nurse Practitioners) who all work together to provide you with the care you need, when you need it. ? ?We recommend signing up for the patient portal called "MyChart".  Sign up information is provided on this After Visit Summary.  MyChart is used to connect with patients for Virtual Visits (Telemedicine).  Patients are able to view lab/test results, encounter notes, upcoming appointments, etc.  Non-urgent messages can be sent to your provider as well.   ?To learn more about what you can do with MyChart, go to [email or web address].   ? ?Your next appointment:   ?12 month(s) ? ?The format for your next appointment:   ?In Person ? ?Provider:    ?Larae Grooms, MD  ?  ? ? ?Other Instructions ? ? ?Your cardiac CT will be scheduled at one of the below locations:  ? ?[facility] ?[address] ?[place], [place] [address] ?[phone] ? ?OR ? ?[facility] [address] ?[address] ?[place], [place] [address] ?[phone] ? ?If scheduled at [facility], please arrive at the [facility] Entrance (Entrance C2) of [facility] 30 minutes prior to test start time. ?You can use the FREE valet parking offered at entrance C (encouraged to control the heart rate for the test)  ?Proceed to the [facility] Radiology Department (first floor) to check-in and test prep. ? ?All radiology patients and guests should use entrance C2 at [facility], accessed from [facility], even though the hospital's physical address listed is [address]. ? ? ? ?If scheduled at [facility], please arrive 15 mins early for check-in and test prep. ? ?Please follow these instructions carefully (unless otherwise directed): ? ?Hold all erectile dysfunction medications at least 3 days (72 hrs) prior to test. ? ?On the Night Before the Test: ?Be sure to Drink plenty of water. ?Do not consume any caffeinated/decaffeinated beverages or chocolate 12 hours prior to your test. ?Do not take any antihistamines 12 hours prior to your test. ? ? ?On the Day of the Test: ?Drink plenty of water until 1 hour prior to the test. ?Do not eat any food 4 hours prior to the test. ?You may take your regular medications prior  to the test.  ?Take metoprolol (Lopressor) two hours prior to test. ?HOLD Furosemide/Hydrochlorothiazide morning of the test. ?FEMALES- please wear underwire-free bra if available, avoid dresses & tight clothing ? ?     ?After the Test: ?Drink plenty of water. ?After receiving IV contrast, you may experience a mild flushed feeling. This is normal. ?On occasion,  you may experience a mild rash up to 24 hours after the test. This is not dangerous. If this occurs, you can take Benadryl 25 mg and increase your fluid intake. ?If you experience trouble breathing, this can be serious. If it is severe call 911 IMMEDIATELY. If it is mild, please call our office. ?If you take any of these medications: Glipizide/Metformin, Avandament, Glucavance, please do not take 48 hours after completing test unless otherwise instructed. ? ?We will call to schedule your test 2-4 weeks out understanding that some insurance companies will need an authorization prior to the service being performed.  ? ?For non-scheduling related questions, please contact the cardiac imaging nurse navigator should you have any questions/concerns: ?Marchia Bond, Cardiac Imaging Nurse Navigator ?Gordy Clement, Cardiac Imaging Nurse Navigator ? [facility] ?Direct Office Dial: [phone]  ? ?For scheduling needs, including cancellations and rescheduling, please call [place], [phone]. ? ? ?

## 2021-07-04 LAB — BASIC METABOLIC PANEL
BUN/Creatinine Ratio: 14 (ref 12–28)
BUN: 14 mg/dL (ref 8–27)
CO2: 23 mmol/L (ref 20–29)
Calcium: 9.2 mg/dL (ref 8.7–10.3)
Chloride: 104 mmol/L (ref 96–106)
Creatinine, Ser: 1.03 mg/dL — ABNORMAL HIGH (ref 0.57–1.00)
Glucose: 52 mg/dL — ABNORMAL LOW (ref 70–99)
Potassium: 3.7 mmol/L (ref 3.5–5.2)
Sodium: 142 mmol/L (ref 134–144)
eGFR: 58 mL/min/{1.73_m2} — ABNORMAL LOW (ref >59)

## 2021-07-08 ENCOUNTER — Other Ambulatory Visit: Payer: Self-pay | Admitting: Neurology

## 2021-07-09 DIAGNOSIS — K439 Ventral hernia without obstruction or gangrene: Secondary | ICD-10-CM | POA: Diagnosis not present

## 2021-07-10 ENCOUNTER — Encounter: Payer: Self-pay | Admitting: Family Medicine

## 2021-07-10 ENCOUNTER — Other Ambulatory Visit: Payer: Self-pay | Admitting: Surgery

## 2021-07-10 ENCOUNTER — Ambulatory Visit: Payer: Medicare PPO | Admitting: Family Medicine

## 2021-07-10 VITALS — BP 124/68 | HR 54 | Temp 97.5°F | Resp 16 | Wt 224.6 lb

## 2021-07-10 DIAGNOSIS — R001 Bradycardia, unspecified: Secondary | ICD-10-CM

## 2021-07-10 DIAGNOSIS — R251 Tremor, unspecified: Secondary | ICD-10-CM

## 2021-07-10 DIAGNOSIS — K439 Ventral hernia without obstruction or gangrene: Secondary | ICD-10-CM

## 2021-07-10 MED ORDER — PROPRANOLOL HCL ER 60 MG PO CP24: 60.0000 mg | ORAL_CAPSULE | Freq: Every day | ORAL | 1 refills | Status: DC

## 2021-07-10 MED ORDER — VERAPAMIL HCL ER 120 MG PO TBCR: 120.0000 mg | EXTENDED_RELEASE_TABLET | Freq: Every day | ORAL | 3 refills | Status: DC

## 2021-07-10 NOTE — Progress Notes (Signed)
? ?  Subjective:  ? ? Patient ID: Lacey [name], female    DOB: [birth year], 71 y.o.   MRN: [id number] ? ?HPI ?Bradycardia- at last visit HR was 38 and we stopped propranolol.  Today HR is 54.  Was 17 at cardiology last week.  Pt reports home HR is 53-65.  Since stopping propranolol she has restarted w/ tremors and feeling clumsy.  No change in fatigue or energy level.  She reports that while on the propranolol she was able to write and use her computer but since stopping she has not been able to do either. ? ? ?Review of Systems ?For ROS see HPI  ? ?This visit occurred during the SARS-CoV-2 public health emergency.  Safety protocols were in place, including screening questions prior to the visit, additional usage of staff PPE, and extensive cleaning of exam room while observing appropriate contact time as indicated for disinfecting solutions.   ?   ?Objective:  ? Physical Exam ?Vitals reviewed.  ?Constitutional:   ?   General: She is not in acute distress. ?   Appearance: Normal appearance. She is not ill-appearing.  ?HENT:  ?   Head: Normocephalic and atraumatic.  ?Cardiovascular:  ?   Rate and Rhythm: Normal rate and regular rhythm.  ?   Pulses: Normal pulses.  ?Pulmonary:  ?   Effort: Pulmonary effort is normal. No respiratory distress.  ?   Breath sounds: Normal breath sounds. No wheezing, rhonchi or rales.  ?Skin: ?   General: Skin is warm and dry.  ?Neurological:  ?   Mental Status: She is alert and oriented to person, place, and time.  ?   Comments: Bilateral hand tremor  ?Psychiatric:     ?   Mood and Affect: Mood normal.     ?   Behavior: Behavior normal.  ? ? ? ? ? ?   ?Assessment & Plan:  ? ? ?

## 2021-07-10 NOTE — Patient Instructions (Addendum)
Follow up in late May or early June to recheck diabetes ?RESTART the Propranolol once daily ?DECREASE the Verapamil to '120mg'$  (rather than the '180mg'$ ).  New prescription sent ?Continue to monitor home BP and pulse ?Call with any questions or concerns ?Hang in there!!! ?Happy Spring!!! ?

## 2021-07-11 NOTE — Assessment & Plan Note (Signed)
Deteriorated since stopping Propranolol.  She is now not able to do her word or number puzzles or use her computer.  This has greatly impacted her quality of life.  Will restart Propranolol but decrease the verapamil in hopes of achieving control of tremor but also acceptable HR.  Pt expressed understanding and is in agreement w/ plan.  ?

## 2021-07-11 NOTE — Assessment & Plan Note (Signed)
Mildly improved since stopping propranolol but pt's overall quality of life has deteriorated as her tremor has returned.  Will decrease her current dose of Verapamil and restart low dose Propranolol in hopes that we can balance HR and tremor management.  Pt expressed understanding and is in agreement w/ plan.  ?

## 2021-07-15 ENCOUNTER — Telehealth (HOSPITAL_COMMUNITY): Payer: Self-pay | Admitting: Emergency Medicine

## 2021-07-15 NOTE — Telephone Encounter (Signed)
Reaching out to patient to offer assistance regarding upcoming cardiac imaging study; pt verbalizes understanding of appt date/time, parking situation and where to check in, pre-test NPO status and medications ordered, and verified current allergies; name and call back number provided for further questions should they arise ?Marchia Bond RN Navigator Cardiac Imaging ?[facility] ?[phone] office ?[phone] cell ? ?Denies iv issues ?'25mg'$  metoprolol tartrate  ?Arrival 900 ?CCTA + CT abd/pelv w/ ?

## 2021-07-17 ENCOUNTER — Ambulatory Visit (HOSPITAL_COMMUNITY)
Admission: RE | Admit: 2021-07-17 | Discharge: 2021-07-17 | Disposition: A | Discharge status: Home / Self Care | Payer: Medicare PPO | Source: Ambulatory Visit | Attending: Interventional Cardiology | Admitting: Interventional Cardiology

## 2021-07-17 ENCOUNTER — Other Ambulatory Visit: Payer: Self-pay

## 2021-07-17 DIAGNOSIS — R072 Precordial pain: Secondary | ICD-10-CM | POA: Insufficient documentation

## 2021-07-17 DIAGNOSIS — N289 Disorder of kidney and ureter, unspecified: Secondary | ICD-10-CM | POA: Diagnosis not present

## 2021-07-17 DIAGNOSIS — K429 Umbilical hernia without obstruction or gangrene: Secondary | ICD-10-CM | POA: Diagnosis not present

## 2021-07-17 DIAGNOSIS — I700 Atherosclerosis of aorta: Secondary | ICD-10-CM | POA: Diagnosis not present

## 2021-07-17 DIAGNOSIS — K439 Ventral hernia without obstruction or gangrene: Secondary | ICD-10-CM | POA: Insufficient documentation

## 2021-07-17 MED ORDER — IOHEXOL 350 MG/ML SOLN
95.0000 mL | Freq: Once | INTRAVENOUS | Status: AC | PRN
  Administered 2021-07-17: 95 mL via INTRAVENOUS

## 2021-07-17 MED ORDER — NITROGLYCERIN 0.4 MG SL SUBL
0.8000 mg | SUBLINGUAL_TABLET | Freq: Once | SUBLINGUAL | Status: AC
  Administered 2021-07-17: 0.8 mg via SUBLINGUAL

## 2021-07-17 MED ORDER — NITROGLYCERIN 0.4 MG SL SUBL
SUBLINGUAL_TABLET | SUBLINGUAL | Status: AC
  Filled 2021-07-17: qty 2

## 2021-07-22 ENCOUNTER — Institutional Professional Consult (permissible substitution): Payer: Medicare PPO | Admitting: Pulmonary Disease

## 2021-08-03 ENCOUNTER — Encounter: Payer: Self-pay | Admitting: Family Medicine

## 2021-08-05 DIAGNOSIS — R0789 Other chest pain: Secondary | ICD-10-CM | POA: Insufficient documentation

## 2021-08-05 DIAGNOSIS — K429 Umbilical hernia without obstruction or gangrene: Secondary | ICD-10-CM | POA: Diagnosis not present

## 2021-08-14 ENCOUNTER — Other Ambulatory Visit: Payer: Self-pay | Admitting: Family Medicine

## 2021-08-19 ENCOUNTER — Encounter: Payer: Self-pay | Admitting: Pulmonary Disease

## 2021-08-19 ENCOUNTER — Other Ambulatory Visit: Payer: Self-pay | Admitting: Neurology

## 2021-08-19 ENCOUNTER — Ambulatory Visit: Payer: Medicare PPO | Admitting: Pulmonary Disease

## 2021-08-19 VITALS — BP 138/60 | HR 32 | Temp 97.7°F | Ht 62.0 in | Wt 226.6 lb

## 2021-08-19 DIAGNOSIS — F17210 Nicotine dependence, cigarettes, uncomplicated: Secondary | ICD-10-CM

## 2021-08-19 DIAGNOSIS — R0602 Shortness of breath: Secondary | ICD-10-CM

## 2021-08-19 DIAGNOSIS — J449 Chronic obstructive pulmonary disease, unspecified: Secondary | ICD-10-CM | POA: Diagnosis not present

## 2021-08-19 MED ORDER — ALBUTEROL SULFATE HFA 108 (90 BASE) MCG/ACT IN AERS: 2.0000 | INHALATION_SPRAY | Freq: Four times a day (QID) | RESPIRATORY_TRACT | 6 refills | Status: DC | PRN

## 2021-08-19 MED ORDER — TRELEGY ELLIPTA 100-62.5-25 MCG/ACT IN AEPB: 1.0000 | INHALATION_SPRAY | Freq: Every day | RESPIRATORY_TRACT | 11 refills | Status: DC

## 2021-08-19 NOTE — Progress Notes (Signed)
? ?Synopsis: Referred in April 2023 for shortness of breath ? ?Subjective:  ? ?PATIENT ID: Lacey [name] GENDER: female DOB: 08/09/1950, MRN: [id number] ? ? ?HPI ? ?Chief Complaint  ?Patient presents with  ? Consult  ?  SOB fairly easily. Can hear wheezing at times.  ? ?Lacey [name] is a 71 year old woman, daily smoker with DM II, CKD, GERD, hypertension, sleep apnea not on CPAP and obesity who is referred to pulmonary clinic for shortness of breath.  ? ?She reports having shortness of breath over the past few months. She notices it when she is climbing into the bed. She has intermittent cough and some wheezing. She has some heartburn. She has not tried any inhalers for her breathing before. She has seasonal allergies that lead to sneezing and watery eyes. ? ?She is smoking 1/4 to 1/3 pack per day. She has smoked for 50 years, previously 1 pack per day. She did not tolerate wellbutrin in the past for smoking cessation due to hallucinations.  ? ?Her sister had lung cancer. Her mother and father had lung issues from working in a [employer].  ? ?Past Medical History:  ?Diagnosis Date  ? Adenomatous colon polyp   ? hyperplastic  ? Anemia   ? Anginal pain ([place])   ? not recent  ? Arthritis   ? NECK, KNEES, FINGERS, TOES  ? Atrial tachycardia ([place]) CARDIOLOGIST - DR Caryl Comes (LAST VISIT AUG 2012)  ? Echo 12/11: EF 55-60%, Mild LVH, grade 1 diast dysfxn;   holter 1/12: ATach  ? Atypical chest pain   ? a. 07/2004 Cath: Clean cors;  b. 04/2010 Myoview: EF 63%, no ischemia  ? Blood transfusion without reported diagnosis 1969  ? Chronic kidney disease   ? left kidney small   ? Diabetes mellitus, type 2 ([place])   ? ORAL AND INSULIN MEDS (LAST A1C  7.3)  ? Diverticulosis   ? Erythema CURRENT-- CLOSED ABD. WALL ABSCESS  ? PER PCP NOTE (03-31-11)- MRSA-- TAKES DOXYCYCLINE  ? GERD (gastroesophageal reflux disease)   ? CONTROLLED W/ PREVACID  ? History of kidney stones 2012  ? Hyperlipidemia   ? Hypertension   ? Insomnia   ? TAKES MEDS PRN  ?  Neuropathy, peripheral   ? Obesity (BMI 30-39.9) 02/19/2015  ? Pneumonia   ? as child  ? Post op infection 11/07/12  ? left bunionectomy  ? Restless leg syndrome   ? Sepsis, urinary HISTORY - 2004  ? Sleep apnea   ? no cpap   ? Thyroid disease   ? Tremor   ? Vitamin D deficiency   ?  ? ?Family History  ?Problem Relation Age of Onset  ? Hyperlipidemia Mother   ? Hypertension Mother   ? Heart attack Father   ? Lung disease Father   ? Heart disease Sister   ? Hypertension Sister   ? Colon polyps Sister   ? Hypertension Sister   ? Colon polyps Sister   ? Hypertension Sister   ? Hypertension Sister   ? Lung cancer Sister 77  ?     stage 4   ? Diabetes Other   ? Breast cancer Other   ? Heart disease Other   ? Colon cancer Other 4  ?     nephew  ? Esophageal cancer Neg Hx   ? Rectal cancer Neg Hx   ? Stomach cancer Neg Hx   ? Amblyopia Neg Hx   ? Blindness Neg Hx   ?  Cataracts Neg Hx   ? Glaucoma Neg Hx   ? Retinal detachment Neg Hx   ? Strabismus Neg Hx   ? Retinitis pigmentosa Neg Hx   ?  ? ?Social History  ? ?Socioeconomic History  ? Marital status: Married  ?  Spouse name: Not on file  ? Number of children: 2  ? Years of education: 88  ? Highest education level: High school graduate  ?Occupational History  ? Occupation: Engineer, materials middle school  ?  Employer: [employer]  ?  Comment: in office  ? Occupation: Retired  ?Tobacco Use  ? Smoking status: Every Day  ?  Packs/day: 0.25  ?  Years: 43.00  ?  Pack years: 10.75  ?  Types: Cigarettes  ? Smokeless tobacco: Never  ? Tobacco comments:  ?  0.75ppd x52yr, 0.25ppd x166yr3/10/17). Counseled to quit -smoking, nicotine replacement and smoking cessation classes discussed-3-4 a day as of 09-06-15  ?Vaping Use  ? Vaping Use: Never used  ?Substance and Sexual Activity  ? Alcohol use: No  ?  Alcohol/week: 0.0 standard drinks  ? Drug use: No  ? Sexual activity: Not on file  ?Other Topics Concern  ? Not on file  ?Social History Narrative  ? 2 children, 2 stepchildren  ? Lives with  husband.  ? Right-handed.  ? No daily caffeine.  ? ?Social Determinants of Health  ? ?Financial Resource Strain: Low Risk   ? Difficulty of Paying Living Expenses: Not hard at all  ?Food Insecurity: No Food Insecurity  ? Worried About RuCharity fundraisern the Last Year: Never true  ? Ran Out of Food in the Last Year: Never true  ?Transportation Needs: No Transportation Needs  ? Lack of Transportation (Medical): No  ? Lack of Transportation (Non-Medical): No  ?Physical Activity: Insufficiently Active  ? Days of Exercise per Week: 3 days  ? Minutes of Exercise per Session: 30 min  ?Stress: No Stress Concern Present  ? Feeling of Stress : Not at all  ?Social Connections: Socially Integrated  ? Frequency of Communication with Friends and Family: Twice a week  ? Frequency of Social Gatherings with Friends and Family: Three times a week  ? Attends Religious Services: More than 4 times per year  ? Active Member of Clubs or Organizations: No  ? Attends ClArchivisteetings: 1 to 4 times per year  ? Marital Status: Married  ?Intimate Partner Violence: Not At Risk  ? Fear of Current or Ex-Partner: No  ? Emotionally Abused: No  ? Physically Abused: No  ? Sexually Abused: No  ?  ? ?No Known Allergies  ? ?Outpatient Medications Prior to Visit  ?Medication Sig Dispense Refill  ? Alcohol Swabs (DROPSAFE ALCOHOL PREP) 70 % PADS USE AS DIRECTED  AS NEEDED. 200 each 3  ? aspirin EC 81 MG tablet Take 81 mg by mouth daily.    ? atorvastatin (LIPITOR) 20 MG tablet TAKE 1 TABLET BY MOUTH EVERY DAY 90 tablet 1  ? Blood Glucose Monitoring Suppl (TRUE METRIX METER) w/Device KIT USE AS DIRECTED 1 kit 1  ? cholecalciferol (VITAMIN D) 25 MCG (1000 UNIT) tablet Take 1,000 Units by mouth daily.    ? clonazePAM (KLONOPIN) 1 MG tablet TAKE 1 TABLET AT BEDTIME FOR RESTLESS LEGS SYNDROME 90 tablet 1  ? diclofenac Sodium (VOLTAREN) 1 % GEL APPLY 2 GRAMS TO AFFECTED AREA 4 TIMES A DAY 300 g 1  ? diphenoxylate-atropine (LOMOTIL) 2.5-0.025 MG  per  tablet Take 1 tablet by mouth 4 (four) times daily as needed for diarrhea or loose stools. 30 tablet 0  ? fenofibrate 160 MG tablet TAKE 1 TABLET EVERY DAY 90 tablet 3  ? furosemide (LASIX) 40 MG tablet Take 1 tablet (40 mg total) by mouth daily. (Patient taking differently: Take 20 mg by mouth daily.) 90 tablet 3  ? gabapentin (NEURONTIN) 300 MG capsule Take 3 capsules (900 mg total) by mouth 3 (three) times daily. 270 capsule 11  ? glucose blood (TRUE METRIX BLOOD GLUCOSE TEST) test strip Use as instructed to test blood sugar 4 times daily. Dx. E11.9 400 each 3  ? insulin aspart (NOVOLOG FLEXPEN) 100 UNIT/ML FlexPen INJECT 25 UNITS SUBCUTANEOUSLY THREE TIMES DAILY WITH MEALS 75 mL 0  ? Insulin Pen Needle (DROPLET PEN NEEDLES) 31G X 8 MM MISC USE 4 TIMES DAILY 400 each 6  ? Lancets Super Thin 28G MISC Please use as directed to test sugars 4 times daily. Dx. E11.9 420 each 3  ? lansoprazole (PREVACID) 15 MG capsule Take 15 mg by mouth daily.    ? LANTUS SOLOSTAR 100 UNIT/ML Solostar Pen INJECT 60 UNITS UNDER THE SKIN AT BEDTIME 60 mL 0  ? levothyroxine (SYNTHROID) 50 MCG tablet TAKE 1 TABLET BY MOUTH EVERY DAY BEFORE BREAKFAST 90 tablet 1  ? MYRBETRIQ 50 MG TB24 tablet Take 1 tablet by mouth daily.    ? ondansetron (ZOFRAN) 4 MG tablet Take 1 tablet (4 mg total) by mouth every 8 (eight) hours as needed for nausea or vomiting. 12 tablet 0  ? propranolol ER (INDERAL LA) 60 MG 24 hr capsule Take 1 capsule (60 mg total) by mouth daily. 90 capsule 1  ? traZODone (DESYREL) 100 MG tablet TAKE 1 TABLET BY MOUTH AT  BEDTIME 90 tablet 3  ? valsartan (DIOVAN) 320 MG tablet TAKE 1 TABLET EVERY DAY 90 tablet 2  ? verapamil (CALAN-SR) 120 MG CR tablet Take 1 tablet (120 mg total) by mouth at bedtime. 30 tablet 3  ? clopidogrel (PLAVIX) 75 MG tablet Take 1 tablet (75 mg total) by mouth daily. 90 tablet 0  ? metoprolol tartrate (LOPRESSOR) 25 MG tablet Take one tablet by mouth 2 hours prior to CT Scan 1 tablet 0  ? ?No  facility-administered medications prior to visit.  ? ? ?Review of Systems  ?Constitutional:  Negative for chills, fever, malaise/fatigue and weight loss.  ?HENT:  Negative for congestion, sinus pain and sor

## 2021-08-19 NOTE — Telephone Encounter (Signed)
Received MyChart message from patient stating that she is not taking the metoprolol that was discussed during her visit today.  ?

## 2021-08-19 NOTE — Patient Instructions (Addendum)
Please check and let us know that you are not taking the metoprolol anymore.  ? ?I will contact [name] and Dr. Carles Collet about your heart rate today.  ? ?Start trelegy ellipta 1 puff daily ?- rinse mouth out after each use ? ?Use albuterol inhaler 1-2 puffs every 4-6 hours as needed for cough, wheezing or shortness of breath.  ? ?Recommend using '7mg'$  nicotine patches per day and use a nicotine lozenge or mini nicotine lozenge as needed.  ? ?Follow up in 4 months  ?

## 2021-08-22 ENCOUNTER — Encounter: Payer: Self-pay | Admitting: Family Medicine

## 2021-08-25 ENCOUNTER — Telehealth: Payer: Self-pay | Admitting: Internal Medicine

## 2021-08-25 NOTE — Telephone Encounter (Signed)
STAT if HR is under 50 or over 120 ?(normal HR is 60-100 beats per minute) ? ?What is your heart rate?  ?49   ? ?Do you have a log of your heart rate readings (document readings)?  ?No log available ? ?Do you have any other symptoms?  ? ?Patient states she saw pulmonologist on 4/18 and her HR was 32. She states it has been ranging in the 30's-40's for the past week and the highest it has gotten to is 63. She is not currently experiencing any symptoms aside from being a little tired.  ? ?

## 2021-08-25 NOTE — Telephone Encounter (Signed)
Patient is calling in concerned with low heart rates. Reading 30's-50's. Using pulse ox. Current HR 53 BP 167/54 with BP Cuff. [name] concerned and asked patient to reach out to Dr. Caryl Comes. Patient states only symptom is fatigue. States she has been working with her PCP for a while now with low HR and medications, even stopping propranolol for a while.   ? ?This is message from PCP and patient April 21: ? ?With your heart rate being as low as 31, I would call Dr Olin Pia office ASAP.  B/c that's more than just a low dose of propranolol.  I would also hold your Diltiazem until he tells you otherwise. ?  ?Hang in there! ?KT, MD ? ?  10:32 AM ?Good Morning, just want you to know that I haven't taken any of the Propranolol since Tuesday. When I got to the lung doctor my heart rate was down to 32 & 31 by the time I got home. Now the shakes are back in full force.  Tell me what to do, I'm dammed if I do & dammed if I don't. I sat up just about all night Tuesday because I was scared to go to sleep with my heart being so low.  I just don't know what I'm suppose to do!  Please let me know what I am to do with these meds! ? ?VS pulled from Pulmonary:  ?Vitals:  ?  08/19/21 1025  ?BP: 138/60  ?Pulse: (!) 32  ?Temp: 97.7 ?F (36.5 ?C)  ?TempSrc: Oral  ?SpO2: 96%  ?Weight: 226 lb 9.6 oz (102.8 kg)  ?Height: '5\' 2"'$  (1.575 m)  ? ? ?Gave patient ED precautions. Verbalized understanding and agreement. Will forward to MD for advisement.  ?

## 2021-08-26 NOTE — Telephone Encounter (Signed)
Pt reports she continues to have low HR at times.  HR this morning is 53.  She has purchased a new pulse ox.  Pt states she was advised to hold her Propranolol but took the medication yesterday.  She has been working with her PCP re:  low HR.  Her PCP decreased her Verapamil from '180mg'$  to '120mg'$  nightly last month to see if this would help but she continues to have periods  where her HR will be in the 30's and 40's.  Pt states she has been taking Verapamil in the mornings.  She states she is asymptomatic other than maybe some increased fatigue.   ?Appointment scheduled with Oda Kilts, PA-C for 05.10.23 at 1020am.  Reviewed ED precautions.  Pt verbalizes understanding and agrees with current plan.  ?

## 2021-08-27 ENCOUNTER — Encounter: Payer: Self-pay | Admitting: Pulmonary Disease

## 2021-09-01 NOTE — Telephone Encounter (Signed)
Appointment with Oda Kilts, PA-C on 09/10/2021. ?

## 2021-09-01 NOTE — Telephone Encounter (Signed)
Would prob benefit from monitor but lets see how things go with appt next week ?Thanks SK   ?

## 2021-09-02 NOTE — Progress Notes (Addendum)
PCP:  Midge Minium, MD Primary Cardiologist: Larae Grooms, MD Electrophysiologist: Virl Axe, MD   Lacey [name] is a 71 y.o. female seen today for Virl Axe, MD for routine electrophysiology followup. Reports recent history of bradycardia  Since last being seen in our clinic the patient reports doing OK from a cardiac perspective overall.  She had HR into the 30s on propranolol, but denies any specific symptoms. She has brief lightheadedness with rapid standing, and at times with prolonged standing in the kitchen. Denies SOB. Has no energy. Still being worked up for tremors.   Past Medical History:  Diagnosis Date   Adenomatous colon polyp    hyperplastic   Anemia    Anginal pain ([place])    not recent   Arthritis    NECK, KNEES, FINGERS, TOES   Atrial tachycardia ([place]) CARDIOLOGIST - DR Caryl Comes (LAST VISIT AUG 2012)   Echo 12/11: EF 55-60%, Mild LVH, grade 1 diast dysfxn;   holter 1/12: ATach   Atypical chest pain    a. 07/2004 Cath: Clean cors;  b. 04/2010 Myoview: EF 63%, no ischemia   Blood transfusion without reported diagnosis 1969   Chronic kidney disease    left kidney small    Diabetes mellitus, type 2 ([facility])    ORAL AND INSULIN MEDS (LAST A1C  7.3)   Diverticulosis    Erythema CURRENT-- CLOSED ABD. WALL ABSCESS   PER PCP NOTE (03-31-11)- MRSA-- TAKES DOXYCYCLINE   GERD (gastroesophageal reflux disease)    CONTROLLED W/ PREVACID   History of kidney stones 2012   Hyperlipidemia    Hypertension    Insomnia    TAKES MEDS PRN   Neuropathy, peripheral    Obesity (BMI 30-39.9) 02/19/2015   Pneumonia    as child   Post op infection 11/07/12   left bunionectomy   Restless leg syndrome    Sepsis, urinary HISTORY - 2004   Sleep apnea    no cpap    Thyroid disease    Tremor    Vitamin D deficiency    Past Surgical History:  Procedure Laterality Date   ABDOMINAL HYSTERECTOMY  1995   ovaries remain   BUNIONECTOMY Left 10/24/2012   BUNIONECTOMY Right  05/17/2017   CARDIAC CATHETERIZATION  07/10/2004   CARPAL TUNNEL RELEASE  2000   RIGHT   CATARACT EXTRACTION, BILATERAL     CERVICAL FUSION  10/12/2007   C5 - 7   COLONOSCOPY     CYSTOSCOPY WITH RETROGRADE PYELOGRAM, URETEROSCOPY AND STENT PLACEMENT Left 11/28/2012   Procedure: LEFT RETROGRADE PYELOGRAM, LEFT URETEROSCOPY, ;  Surgeon: Claybon Jabs, MD;  Location: [facility];  Service: Urology;  Laterality: Left;   CYSTOSCOPY/RETROGRADE/URETEROSCOPY/STONE EXTRACTION WITH BASKET  X2 2004 & 2009   LEFT    GASTRIC BYPASS  1981   KNEE ARTHROSCOPY  12/2010   LEFT   LAPAROSCOPIC CHOLECYSTECTOMY  2001   left thumb joint surgery  2013   PERCUTANEOUS NEPHROSTOLITHOTOMY  02/27/2011   LEFT   POLYPECTOMY     RIGHT THUMB JOINT SURG.  09/2009   TRIGGER FINGER RELEASE  2010   RIGHT THUMB   UPPER GASTROINTESTINAL ENDOSCOPY     URETERAL STENT PLACEMENT  X2  2004  &  2009   LEFT   URETEROSCOPY  04/06/2011   Procedure: URETEROSCOPY;  Surgeon: Claybon Jabs, MD;  Location: [facility];  Service: Urology;  Laterality: Left;  L Ureteroscopy Laser Litho & Stent     Current Outpatient Medications  Medication Sig Dispense Refill   albuterol (VENTOLIN HFA) 108 (90 Base) MCG/ACT inhaler Inhale 2 puffs into the lungs every 6 (six) hours as needed for wheezing or shortness of breath. 8 g 6   Alcohol Swabs (DROPSAFE ALCOHOL PREP) 70 % PADS USE AS DIRECTED  AS NEEDED. 200 each 3   aspirin EC 81 MG tablet Take 81 mg by mouth daily.     atorvastatin (LIPITOR) 20 MG tablet TAKE 1 TABLET BY MOUTH EVERY DAY 90 tablet 1   Blood Glucose Monitoring Suppl (TRUE METRIX METER) w/Device KIT USE AS DIRECTED 1 kit 1   cholecalciferol (VITAMIN D) 25 MCG (1000 UNIT) tablet Take 1,000 Units by mouth daily.     clonazePAM (KLONOPIN) 1 MG tablet TAKE 1 TABLET AT BEDTIME FOR RESTLESS LEGS SYNDROME 90 tablet 1   diclofenac Sodium (VOLTAREN) 1 % GEL APPLY 2 GRAMS TO AFFECTED AREA 4 TIMES A DAY 300 g 1    diphenoxylate-atropine (LOMOTIL) 2.5-0.025 MG per tablet Take 1 tablet by mouth 4 (four) times daily as needed for diarrhea or loose stools. 30 tablet 0   fenofibrate 160 MG tablet TAKE 1 TABLET EVERY DAY 90 tablet 3   Fluticasone-Umeclidin-Vilant (TRELEGY ELLIPTA) 100-62.5-25 MCG/ACT AEPB Inhale 1 puff into the lungs daily. 28 each 11   furosemide (LASIX) 40 MG tablet Take 1 tablet (40 mg total) by mouth daily. (Patient taking differently: Take 20 mg by mouth daily.) 90 tablet 3   gabapentin (NEURONTIN) 300 MG capsule Take 3 capsules (900 mg total) by mouth 3 (three) times daily. 270 capsule 11   glucose blood (TRUE METRIX BLOOD GLUCOSE TEST) test strip Use as instructed to test blood sugar 4 times daily. Dx. E11.9 400 each 3   insulin aspart (NOVOLOG FLEXPEN) 100 UNIT/ML FlexPen INJECT 25 UNITS SUBCUTANEOUSLY THREE TIMES DAILY WITH MEALS 75 mL 0   Insulin Pen Needle (DROPLET PEN NEEDLES) 31G X 8 MM MISC USE 4 TIMES DAILY 400 each 6   Lancets Super Thin 28G MISC Please use as directed to test sugars 4 times daily. Dx. E11.9 420 each 3   lansoprazole (PREVACID) 15 MG capsule Take 15 mg by mouth daily.     LANTUS SOLOSTAR 100 UNIT/ML Solostar Pen INJECT 60 UNITS UNDER THE SKIN AT BEDTIME 60 mL 0   levothyroxine (SYNTHROID) 50 MCG tablet TAKE 1 TABLET BY MOUTH EVERY DAY BEFORE BREAKFAST 90 tablet 1   MYRBETRIQ 50 MG TB24 tablet Take 1 tablet by mouth daily.     ondansetron (ZOFRAN) 4 MG tablet Take 1 tablet (4 mg total) by mouth every 8 (eight) hours as needed for nausea or vomiting. 12 tablet 0   propranolol ER (INDERAL LA) 60 MG 24 hr capsule Take 1 capsule (60 mg total) by mouth daily. 90 capsule 1   traZODone (DESYREL) 100 MG tablet TAKE 1 TABLET BY MOUTH AT  BEDTIME 90 tablet 3   valsartan (DIOVAN) 320 MG tablet TAKE 1 TABLET EVERY DAY 90 tablet 2   verapamil (CALAN-SR) 120 MG CR tablet Take 1 tablet (120 mg total) by mouth at bedtime. 30 tablet 3   No current facility-administered medications  for this visit.    No Known Allergies  Social History   Socioeconomic History   Marital status: Married    Spouse name: Not on file   Number of children: 2   Years of education: 12   Highest education level: High school graduate  Occupational History   Occupation: Engineer, materials middle school  Employer: [employer]    Comment: in office   Occupation: Retired  Tobacco Use   Smoking status: Every Day    Packs/day: 0.25    Years: 43.00    Pack years: 10.75    Types: Cigarettes   Smokeless tobacco: Never   Tobacco comments:    0.75ppd x19yr, 0.25ppd x114yr3/10/17). Counseled to quit -smoking, nicotine replacement and smoking cessation classes discussed-3-4 a day as of 09-06-15  Vaping Use   Vaping Use: Never used  Substance and Sexual Activity   Alcohol use: No    Alcohol/week: 0.0 standard drinks   Drug use: No   Sexual activity: Not on file  Other Topics Concern   Not on file  Social History Narrative   2 children, 2 stepchildren   Lives with husband.   Right-handed.   No daily caffeine.   Social Determinants of Health   Financial Resource Strain: Low Risk    Difficulty of Paying Living Expenses: Not hard at all  Food Insecurity: No Food Insecurity   Worried About RuCharity fundraisern the Last Year: Never true   [place] the Last Year: Never true  Transportation Needs: No Transportation Needs   Lack of Transportation (Medical): No   Lack of Transportation (Non-Medical): No  Physical Activity: Insufficiently Active   Days of Exercise per Week: 3 days   Minutes of Exercise per Session: 30 min  Stress: No Stress Concern Present   Feeling of Stress : Not at all  Social Connections: Socially Integrated   Frequency of Communication with Friends and Family: Twice a week   Frequency of Social Gatherings with Friends and Family: Three times a week   Attends Religious Services: More than 4 times per year   Active Member of Clubs or Organizations: No    Attends ClArchivisteetings: 1 to 4 times per year   Marital Status: Married  InHuman resources officeriolence: Not At Risk   Fear of Current or Ex-Partner: No   Emotionally Abused: No   Physically Abused: No   Sexually Abused: No     Review of Systems: All other systems reviewed and are otherwise negative except as noted above.  Physical Exam: Vitals:   09/10/21 1017  BP: (!) 152/78  Pulse: 66  SpO2: 96%  Weight: 226 lb (102.5 kg)  Height: '5\' 2"'  (1.575 m)    GEN- The patient is well appearing, alert and oriented x 3 today.   HEENT: normocephalic, atraumatic; sclera clear, conjunctiva pink; hearing intact; oropharynx clear; neck supple, no JVP Lymph- no cervical lymphadenopathy Lungs- Clear to ausculation bilaterally, normal work of breathing.  No wheezes, rales, rhonchi Heart- Regular rate and rhythm, no murmurs, rubs or gallops, PMI not laterally displaced GI- soft, non-tender, non-distended, bowel sounds present, no hepatosplenomegaly Extremities- no clubbing, cyanosis, or edema; DP/PT/radial pulses 2+ bilaterally MS- no significant deformity or atrophy Skin- warm and dry, no rash or lesion Psych- euthymic mood, full affect Neuro- strength and sensation are intact  EKG is not ordered. HR in 60s regular on exam.   Additional studies reviewed include: Previous EP office notes.   Assessment and Plan:  1. Bradycardia 2. Fatigue Will place monitor to assess HR response and trends Suspect her fatigue multifactorial, as bradycardia has mostly resolved off propranolol but fatigue persists.   3. Atrial tachycardia Further adjustment of her verapamil limited by bradycardia Also recently restarted on propranolol (for tremors), but then stopped with bradycardia.  4. CAD Non-obstructive CAD on CT 07/17/2021  Follow up with Dr. Caryl Comes in 3 months   Shirley Friar, PA-C  09/02/21 9:49 AM

## 2021-09-10 ENCOUNTER — Ambulatory Visit (INDEPENDENT_AMBULATORY_CARE_PROVIDER_SITE_OTHER): Payer: Medicare PPO

## 2021-09-10 ENCOUNTER — Encounter: Payer: Self-pay | Admitting: Student

## 2021-09-10 ENCOUNTER — Ambulatory Visit: Payer: Medicare PPO | Admitting: Student

## 2021-09-10 VITALS — BP 152/78 | HR 66 | Ht 62.0 in | Wt 226.0 lb

## 2021-09-10 DIAGNOSIS — R001 Bradycardia, unspecified: Secondary | ICD-10-CM | POA: Diagnosis not present

## 2021-09-10 DIAGNOSIS — Z794 Long term (current) use of insulin: Secondary | ICD-10-CM

## 2021-09-10 DIAGNOSIS — E1159 Type 2 diabetes mellitus with other circulatory complications: Secondary | ICD-10-CM | POA: Diagnosis not present

## 2021-09-10 NOTE — Patient Instructions (Addendum)
Medication Instructions:  ?Your physician recommends that you continue on your current medications as directed. Please refer to the Current Medication list given to you today. ? ?*If you need a refill on your cardiac medications before your next appointment, please call your pharmacy* ? ? ?Lab Work: ?None  ?If you have labs (blood work) drawn today and your tests are completely normal, you will receive your results only by: ?MyChart Message (if you have MyChart) OR ?A paper copy in the mail ?If you have any lab test that is abnormal or we need to change your treatment, we will call you to review the results. ? ? ?Follow-Up: ?At [facility], you and your health needs are our priority.  As part of our continuing mission to provide you with exceptional heart care, we have created designated Provider Care Teams.  These Care Teams include your primary Cardiologist (physician) and Advanced Practice Providers (APPs -  Physician Assistants and Nurse Practitioners) who all work together to provide you with the care you need, when you need it. ? ? ?Your next appointment:   ?2-3 month(s) ? ?The format for your next appointment:   ?In Person ? ?Provider:   ?Lacey Axe, MD ? ?ZIO XT- Long Term Monitor Instructions ? ?Your physician has requested you wear a ZIO patch monitor for 7 days.  ?This is a single patch monitor. [employer] supplies one patch monitor per enrollment. Additional ?stickers are not available. Please do not apply patch if you will be having a Nuclear Stress Test,  ?Echocardiogram, Cardiac CT, MRI, or Chest Xray during the period you would be wearing the  ?monitor. The patch cannot be worn during these tests. You cannot remove and re-apply the  ?ZIO XT patch monitor.  ?Your ZIO patch monitor will be mailed 3 day USPS to your address on file. It may take 3-5 days  ?to receive your monitor after you have been enrolled.  ?Once you have received your monitor, please review the enclosed instructions. Your monitor   ?has already been registered assigning a specific monitor serial # to you. ? ?Billing and Patient Assistance Program Information ? ?We have supplied [employer] with any of your insurance information on file for billing purposes. ?[employer] offers a sliding scale Patient Assistance Program for patients that do not have  ?insurance, or whose insurance does not completely cover the cost of the ZIO monitor.  ?You must apply for the Patient Assistance Program to qualify for this discounted rate.  ?To apply, please call [employer] at [phone], select option 4, select option 2, ask to apply for  ?Patient Assistance Program. Theodore Demark will ask your household income, and how many people  ?are in your household. They will quote your out-of-pocket cost based on that information.  ?[employer] will also be able to set up a 35-month interest-free payment plan if needed. ? ?Applying the monitor ?  ?Shave hair from upper left chest.  ?Hold abrader disc by orange tab. Rub abrader in 40 strokes over the upper left chest as  ?indicated in your monitor instructions.  ?Clean area with 4 enclosed alcohol pads. Let dry.  ?Apply patch as indicated in monitor instructions. Patch will be placed under collarbone on left  ?side of chest with arrow pointing upward.  ?Rub patch adhesive wings for 2 minutes. Remove white label marked "1". Remove the white  ?label marked "2". Rub patch adhesive wings for 2 additional minutes.  ?While looking in a mirror, press and release button in center of patch. A  small green light will  ?flash 3-4 times. This will be your only indicator that the monitor has been turned on.  ?Do not shower for the first 24 hours. You may shower after the first 24 hours.  ?Press the button if you feel a symptom. You will hear a small click. Record Date, Time and  ?Symptom in the Patient Logbook.  ?When you are ready to remove the patch, follow instructions on the last 2 pages of Patient  ?Logbook. Stick patch monitor onto the last page  of Patient Logbook.  ?Place Patient Logbook in the blue and white box. Use locking tab on box and tape box closed  ?securely. The blue and white box has prepaid postage on it. Please place it in the mailbox as  ?soon as possible. Your physician should have your test results approximately 7 days after the  ?monitor has been mailed back to [facility].  ?Call [facility] at [phone] if you have questions regarding  ?your ZIO XT patch monitor. Call them immediately if you see an orange light blinking on your  ?monitor.  ?If your monitor falls off in less than 4 days, contact our Monitor department at [phone].  ?If your monitor becomes loose or falls off after 4 days call [employer] at [phone] for  ?suggestions on securing your monitor ? ?

## 2021-09-10 NOTE — Progress Notes (Unsigned)
Applied a 14 day Zio Xt monitor to patient in the office ? ?Dr Caryl Comes to read ?

## 2021-09-18 DIAGNOSIS — Z87440 Personal history of urinary (tract) infections: Secondary | ICD-10-CM | POA: Diagnosis not present

## 2021-09-18 DIAGNOSIS — I110 Hypertensive heart disease with heart failure: Secondary | ICD-10-CM | POA: Diagnosis not present

## 2021-09-18 DIAGNOSIS — Z8673 Personal history of transient ischemic attack (TIA), and cerebral infarction without residual deficits: Secondary | ICD-10-CM | POA: Diagnosis not present

## 2021-09-18 DIAGNOSIS — E1136 Type 2 diabetes mellitus with diabetic cataract: Secondary | ICD-10-CM | POA: Diagnosis not present

## 2021-09-18 DIAGNOSIS — E1140 Type 2 diabetes mellitus with diabetic neuropathy, unspecified: Secondary | ICD-10-CM | POA: Diagnosis not present

## 2021-09-18 DIAGNOSIS — E039 Hypothyroidism, unspecified: Secondary | ICD-10-CM | POA: Diagnosis not present

## 2021-09-18 DIAGNOSIS — I509 Heart failure, unspecified: Secondary | ICD-10-CM | POA: Diagnosis not present

## 2021-09-18 DIAGNOSIS — Z794 Long term (current) use of insulin: Secondary | ICD-10-CM | POA: Diagnosis not present

## 2021-09-18 DIAGNOSIS — J449 Chronic obstructive pulmonary disease, unspecified: Secondary | ICD-10-CM | POA: Diagnosis not present

## 2021-09-18 DIAGNOSIS — E261 Secondary hyperaldosteronism: Secondary | ICD-10-CM | POA: Diagnosis not present

## 2021-09-23 ENCOUNTER — Other Ambulatory Visit: Payer: Self-pay | Admitting: Family Medicine

## 2021-09-23 DIAGNOSIS — G2581 Restless legs syndrome: Secondary | ICD-10-CM

## 2021-09-24 ENCOUNTER — Ambulatory Visit: Payer: Medicare PPO | Admitting: Family Medicine

## 2021-09-24 ENCOUNTER — Encounter: Payer: Self-pay | Admitting: Family Medicine

## 2021-09-24 VITALS — BP 138/62 | HR 59 | Temp 98.4°F | Resp 16 | Ht 62.0 in | Wt 225.6 lb

## 2021-09-24 DIAGNOSIS — Z794 Long term (current) use of insulin: Secondary | ICD-10-CM

## 2021-09-24 DIAGNOSIS — E113391 Type 2 diabetes mellitus with moderate nonproliferative diabetic retinopathy without macular edema, right eye: Secondary | ICD-10-CM | POA: Diagnosis not present

## 2021-09-24 LAB — BASIC METABOLIC PANEL
BUN: 22 mg/dL (ref 6–23)
CO2: 28 mEq/L (ref 19–32)
Calcium: 9.1 mg/dL (ref 8.4–10.5)
Chloride: 103 mEq/L (ref 96–112)
Creatinine, Ser: 1.07 mg/dL (ref 0.40–1.20)
GFR: 52.31 mL/min — ABNORMAL LOW (ref >60.00)
Glucose, Bld: 143 mg/dL — ABNORMAL HIGH (ref 70–99)
Potassium: 4.5 mEq/L (ref 3.5–5.1)
Sodium: 139 mEq/L (ref 135–145)

## 2021-09-24 LAB — MICROALBUMIN / CREATININE URINE RATIO
Creatinine,U: 47 mg/dL
Microalb Creat Ratio: 23.5 mg/g (ref 0.0–30.0)
Microalb, Ur: 11 mg/dL — ABNORMAL HIGH (ref 0.0–1.9)

## 2021-09-24 LAB — HEMOGLOBIN A1C: Hgb A1c MFr Bld: 7.6 % — ABNORMAL HIGH (ref 4.6–6.5)

## 2021-09-24 NOTE — Progress Notes (Signed)
   Subjective:    Patient ID: Lacey [name], female    DOB: [birth year], 71 y.o.   MRN: [id number]  HPI DM- chronic problem, on Lantus 60 units nightly, Novolog 25 units TID.  On ARB for renal protection, due for microalbumin.  UTD on eye exam, foot exam.  Pt reports she feels better.  No CP, SOB, HAs, visual changes, edema.  No abd pain, N/V.  No lows since adjusting insulin dose.  No change in neuropathy.   Review of Systems For ROS see HPI     Objective:   Physical Exam Vitals reviewed.  Constitutional:      General: She is not in acute distress.    Appearance: Normal appearance. She is well-developed. She is obese. She is not ill-appearing.  HENT:     Head: Normocephalic and atraumatic.  Eyes:     Conjunctiva/sclera: Conjunctivae normal.     Pupils: Pupils are equal, round, and reactive to light.  Neck:     Thyroid: No thyromegaly.  Cardiovascular:     Rate and Rhythm: Normal rate and regular rhythm.     Pulses: Normal pulses.     Heart sounds: Normal heart sounds. No murmur heard.    Comments: Zio patch in place Pulmonary:     Effort: Pulmonary effort is normal. No respiratory distress.     Breath sounds: Normal breath sounds.  Abdominal:     General: There is no distension.     Palpations: Abdomen is soft.     Tenderness: There is no abdominal tenderness.  Musculoskeletal:     Cervical back: Normal range of motion and neck supple.     Right lower leg: No edema.     Left lower leg: No edema.  Lymphadenopathy:     Cervical: No cervical adenopathy.  Skin:    General: Skin is warm and dry.  Neurological:     General: No focal deficit present.     Mental Status: She is alert and oriented to person, place, and time.  Psychiatric:        Mood and Affect: Mood normal.        Behavior: Behavior normal.          Assessment & Plan:

## 2021-09-24 NOTE — Assessment & Plan Note (Signed)
Pt looks better today than I have seen her in some time.  She is no longer having symptomatic lows since we adjusted her Novolog and Lantus.  UTD on eye exam, foot exam.  On ARB but will get microalbumin.  Check labs.  Adjust meds prn

## 2021-09-24 NOTE — Patient Instructions (Signed)
Schedule your complete physical in 3-4 months We'll notify you of your lab results and make any changes if needed Continue to work on healthy diet and regular physical activity- you can do it!!! Call with any questions or concerns Stay Safe!  Stay Healthy! Have a great summer!!!

## 2021-09-25 ENCOUNTER — Telehealth: Payer: Self-pay

## 2021-09-25 NOTE — Telephone Encounter (Signed)
Spoke w/ pt in regards to lab results

## 2021-09-25 NOTE — Telephone Encounter (Signed)
-----   Message from Midge Minium, MD sent at 09/25/2021  7:23 AM EDT ----- Labs look good!  No changes at this time

## 2021-10-01 DIAGNOSIS — R001 Bradycardia, unspecified: Secondary | ICD-10-CM | POA: Diagnosis not present

## 2021-10-03 ENCOUNTER — Other Ambulatory Visit: Payer: Self-pay | Admitting: Family Medicine

## 2021-10-06 ENCOUNTER — Other Ambulatory Visit: Payer: Self-pay | Admitting: Family Medicine

## 2021-10-16 ENCOUNTER — Other Ambulatory Visit: Payer: Self-pay | Admitting: Family Medicine

## 2021-10-16 ENCOUNTER — Ambulatory Visit (INDEPENDENT_AMBULATORY_CARE_PROVIDER_SITE_OTHER)
Admission: RE | Admit: 2021-10-16 | Discharge: 2021-10-16 | Disposition: A | Discharge status: Home / Self Care | Payer: Medicare PPO | Source: Ambulatory Visit | Attending: Acute Care | Admitting: Acute Care

## 2021-10-16 DIAGNOSIS — F17210 Nicotine dependence, cigarettes, uncomplicated: Secondary | ICD-10-CM | POA: Diagnosis not present

## 2021-10-16 DIAGNOSIS — Z87891 Personal history of nicotine dependence: Secondary | ICD-10-CM | POA: Diagnosis not present

## 2021-10-17 NOTE — Progress Notes (Unsigned)
Assessment/Plan:    Tremor Inderal LA was effective, but she had significant bradycardia and it had to be discontinued. She did have an abnormal DaTscan, albeit slightly so when I reviewed it.  She does not yet meet criteria for an atypical state, although it is my suspicion that she is more likely to develop an atypical state than idiopathic Parkinson's disease.  Time will certainly tell.  She does not meet criteria for either of them right now.  Discussed with patient that DaTscan can be abnormal up to 10 years before diagnostic criteria for clinical syndrome is met.   Discussed skin biopsies for alpha-synuclein. 2.  History of cerebral infarction  -She is on aspirin, 81 mg daily  -Talked about importance of blood pressure control with a goal <130/80 mm Hg.   -Talked about importance of lipid control and proper diet.  Lipids should be managed intensively, with a goal LDL < 70 mg/dL.  She is on Lipitor, 20 mg daily.    2.  Gait instability             -Likely multifactorial.  She is on multiple medications that may contribute to falls, including clonazepam, trazodone, fairly high-dose gabapentin (900 mg 3 times per day).     3.  Hx of nephrolithiasis             -Drugs like topiramate and Zonegran would not be able to be used because of this for her tremor.  Subjective:   Lacey [name] was seen today in follow up for testing results. Pt with husband who supplements hx.   she had an echocardiogram done since our last visit.  Left ventricular ejection fraction was 60 to 65%.  Carotid ultrasound without significant stenosis.   She has had no further lateralizing signs or symptoms.  Primary care stopped her Inderal LA because of significant bradycardia into the 30s.  When she followed back up, the patient complained about significant tremor, so primary care restarted her propranolol and decreased her verapamil.  She emailed her primary care back not long thereafter and stated that she has been  off of the propranolol and her heart rate was still in the low 30s (32), but her tremor was worse.  Cardiology placed a long-term/event monitor just a few weeks ago, and formal report is not yet back, but preliminary looks fairly unremarkable.   Prior medications: Propranolol (bradycardia)   ALLERGIES:  No Known Allergies  CURRENT MEDICATIONS:  Outpatient Encounter Medications as of 10/21/2021  Medication Sig   albuterol (VENTOLIN HFA) 108 (90 Base) MCG/ACT inhaler Inhale 2 puffs into the lungs every 6 (six) hours as needed for wheezing or shortness of breath.   Alcohol Swabs (DROPSAFE ALCOHOL PREP) 70 % PADS USE AS DIRECTED  AS NEEDED.   aspirin EC 81 MG tablet Take 81 mg by mouth daily.   atorvastatin (LIPITOR) 20 MG tablet TAKE 1 TABLET BY MOUTH EVERY DAY   Blood Glucose Monitoring Suppl (TRUE METRIX METER) w/Device KIT USE AS DIRECTED   cholecalciferol (VITAMIN D) 25 MCG (1000 UNIT) tablet Take 1,000 Units by mouth daily.   clonazePAM (KLONOPIN) 1 MG tablet TAKE 1 TABLET AT BEDTIME FOR RESTLESS LEGS SYNDROME   diclofenac Sodium (VOLTAREN) 1 % GEL APPLY 2 GRAMS TO AFFECTED AREA 4 TIMES A DAY   diphenoxylate-atropine (LOMOTIL) 2.5-0.025 MG per tablet Take 1 tablet by mouth 4 (four) times daily as needed for diarrhea or loose stools.   fenofibrate 160 MG tablet TAKE 1  TABLET EVERY DAY   Fluticasone-Umeclidin-Vilant (TRELEGY ELLIPTA) 100-62.5-25 MCG/ACT AEPB Inhale 1 puff into the lungs daily.   furosemide (LASIX) 20 MG tablet Take by mouth.   furosemide (LASIX) 40 MG tablet Take 1 tablet (40 mg total) by mouth daily. (Patient taking differently: Take 20 mg by mouth daily.)   gabapentin (NEURONTIN) 300 MG capsule Take 3 capsules (900 mg total) by mouth 3 (three) times daily.   glucose blood (TRUE METRIX BLOOD GLUCOSE TEST) test strip Use as instructed to test blood sugar 4 times daily. Dx. E11.9   insulin aspart (NOVOLOG FLEXPEN) 100 UNIT/ML FlexPen INJECT 25 UNITS SUBCUTANEOUSLY THREE TIMES  DAILY WITH MEALS   Insulin Pen Needle (DROPLET PEN NEEDLES) 31G X 8 MM MISC USE 4 TIMES DAILY   Lancets Super Thin 28G MISC Please use as directed to test sugars 4 times daily. Dx. E11.9   lansoprazole (PREVACID) 15 MG capsule Take 15 mg by mouth daily.   LANTUS SOLOSTAR 100 UNIT/ML Solostar Pen INJECT 60 UNITS UNDER THE SKIN AT BEDTIME   levothyroxine (SYNTHROID) 50 MCG tablet TAKE 1 TABLET BY MOUTH EVERY DAY BEFORE BREAKFAST   MYRBETRIQ 50 MG TB24 tablet Take 1 tablet by mouth daily.   ondansetron (ZOFRAN) 4 MG tablet Take 1 tablet (4 mg total) by mouth every 8 (eight) hours as needed for nausea or vomiting.   traZODone (DESYREL) 100 MG tablet TAKE 1 TABLET BY MOUTH AT  BEDTIME   valsartan (DIOVAN) 320 MG tablet TAKE 1 TABLET EVERY DAY   verapamil (CALAN-SR) 120 MG CR tablet TAKE 1 TABLET BY MOUTH AT BEDTIME.   No facility-administered encounter medications on file as of 10/21/2021.    Objective:   PHYSICAL EXAMINATION:    VITALS:   There were no vitals filed for this visit.   GEN:  The patient appears stated age and is in NAD. HEENT:  Normocephalic, atraumatic.  The mucous membranes are moist. The superficial temporal arteries are without ropiness or tenderness. CV:  RRR Lungs:  CTAB Neck/HEME:  There are no carotid bruits bilaterally.   Neurological examination:   Orientation: The patient is alert and oriented x3.  Cranial nerves: There is good facial symmetry.  Extraocular muscles are intact. The visual fields are full to confrontational testing. The speech is fluent and clear. Soft palate rises symmetrically and there is no tongue deviation. Hearing is intact to conversational tone. Sensation: Sensation is intact to light touch throughout (facial, trunk, extremities). Vibration is intact at the bilateral big toe. There is no extinction with double simultaneous stimulation.  Motor: Strength is 5/5 in the bilateral upper and lower extremities.   Shoulder shrug is equal and  symmetric.  There is no pronator drift.    Movement examination: Tone: There is normal tone in the bilateral upper extremities.  The tone in the lower extremities is normal.  Abnormal movements: there is a rest tremor, intermittent, on the L but it doesn't increase with distraction.  LE tremor is felt more than seen.  She has no postural tremor in the UE but there is intention tremor on the R.  She has no trouble with Archimedes spirals.   Coordination:  There is no decremation with RAM's, with any form of RAMS, including alternating supination and pronation of the forearm, hand opening and closing, finger taps, heel taps and toe taps. Gait and Station: The patient has mild difficulty arising out of a deep-seated chair without the use of the hands. The patient's stride length is good  but she has slightly antalgic gait with the R leg appearing somewhat shorter than the L.  She has almost no arm swing bilaterally.  she leans to the right when she sits.  She has some trouble in the turn.  I have reviewed and interpreted the following labs independently    Chemistry      Component Value Date/Time   NA 139 09/24/2021 1051   NA 142 07/03/2021 1637   K 4.5 Hemolysis seen.Marland Kitchen 09/24/2021 1051   CL 103 09/24/2021 1051   CO2 28 09/24/2021 1051   BUN 22 09/24/2021 1051   BUN 14 07/03/2021 1637   CREATININE 1.07 09/24/2021 1051   CREATININE 0.89 11/18/2012 1433      Component Value Date/Time   CALCIUM 9.1 09/24/2021 1051   ALKPHOS 47 06/20/2021 1114   AST 13 06/20/2021 1114   ALT 7 06/20/2021 1114   BILITOT 0.5 06/20/2021 1114       Lab Results  Component Value Date   WBC 10.3 06/20/2021   HGB 14.5 06/20/2021   HCT 44.0 06/20/2021   MCV 93.4 06/20/2021   PLT 162.0 06/20/2021    Lab Results  Component Value Date   TSH 2.73 06/20/2021   Lab Results  Component Value Date   CHOL 139 06/20/2021   HDL 41.90 06/20/2021   LDLCALC 72 06/20/2021   LDLDIRECT 86.0 05/02/2015   TRIG 124.0  06/20/2021   CHOLHDL 3 06/20/2021   Lab Results  Component Value Date   HGBA1C 7.6 (H) 09/24/2021     Total time spent on today's visit was *** minutes, including both face-to-face time and nonface-to-face time.  Time included that spent on review of records (prior notes available to me/labs/imaging if pertinent), discussing treatment and goals, answering patient's questions and coordinating care.  Cc:  Midge Minium, MD

## 2021-10-20 ENCOUNTER — Telehealth: Payer: Self-pay | Admitting: Acute Care

## 2021-10-20 DIAGNOSIS — Z87891 Personal history of nicotine dependence: Secondary | ICD-10-CM

## 2021-10-20 DIAGNOSIS — F17210 Nicotine dependence, cigarettes, uncomplicated: Secondary | ICD-10-CM

## 2021-10-20 DIAGNOSIS — Z122 Encounter for screening for malignant neoplasm of respiratory organs: Secondary | ICD-10-CM

## 2021-10-20 NOTE — Telephone Encounter (Signed)
Received a call report on patient's lung cancer screening CT scan. Below are the impressions:   IMPRESSION: 1. Small amount of gas in the visualized upper left renal collecting system, new from 07/17/2021 CT abdomen study, nonspecific, potentially due to recent intervention, with infection not excluded. Recommend correlation with urinalysis. 2. Lung-RADS 2, benign appearance or behavior. Continue annual screening with low-dose chest CT without contrast in 12 months. 3. Three-vessel coronary atherosclerosis. 4. Aortic Atherosclerosis (ICD10-I70.0) and Emphysema (ICD10-J43.9).

## 2021-10-21 ENCOUNTER — Ambulatory Visit: Payer: Medicare PPO | Admitting: Neurology

## 2021-10-21 ENCOUNTER — Encounter: Payer: Self-pay | Admitting: Neurology

## 2021-10-21 VITALS — BP 148/58 | HR 63 | Ht 62.0 in | Wt 227.8 lb

## 2021-10-21 DIAGNOSIS — R251 Tremor, unspecified: Secondary | ICD-10-CM

## 2021-10-21 MED ORDER — PRIMIDONE 50 MG PO TABS: 50.0000 mg | ORAL_TABLET | Freq: Two times a day (BID) | ORAL | 1 refills | Status: DC

## 2021-10-21 NOTE — Patient Instructions (Signed)
Start primidone 50 mg - 1/2 tablet at bedtime for 1 week and then increase to 1 tablet at bedtime x 1 week, then 1 tablet twice per day thereafter

## 2021-10-22 ENCOUNTER — Telehealth: Payer: Self-pay | Admitting: Acute Care

## 2021-10-22 NOTE — Telephone Encounter (Signed)
I have called the patient with the results of her low dose CT Chest. Her scan was read as a Lung RADS 2: nodules that are benign in appearance and behavior with a very low likelihood of becoming a clinically active cancer due to size or lack of growth. Recommendation per radiology is for a repeat LDCT in 12 months. She will need an annual screening scan 10/2022. There was notation of a Small amount of gas in the visualized upper left renal collecting system, new from 07/17/2021 CT abdomen study, nonspecific, potentially due to recent intervention, with infection not excluded. Recommend correlation with urinalysis. I have added Dr. Audie Pinto to this telephone message . Dr. Audie Pinto, please order UA if you feel this is clinically appropriate. Thanks so much Lacey [name], please order 12 month follow up low dose CT Chest. Thanks so much  There was also notation of three vessel CAD and Aortic Atherosclerosis. Pt. Is on statin therapy  Of note, patient is bringing her husband to see [name] Friday. She was wondering if she could get the UA done at that time if [name] is in agreement.

## 2021-10-22 NOTE — Telephone Encounter (Signed)
I have called the patient with the results of her low dose CT Chest. Her scan was read as a Lung RADS 2: nodules that are benign in appearance and behavior with a very low likelihood of becoming a clinically active cancer due to size or lack of growth. Recommendation per radiology is for a repeat LDCT in 12 months. She will need an annual screening scan 10/2022. There was notation of a Small amount of gas in the visualized upper left renal collecting system, new from 07/17/2021 CT abdomen study, nonspecific, potentially due to recent intervention, with infection not excluded. Recommend correlation with urinalysis. I have added Dr. Audie Pinto to this telephone message . Dr. Audie Pinto, please order UA if you feel this is clinically appropriate. Thanks so much Langley Gauss, please order 12 month follow up low dose CT Chest. Thanks so much   There was also notation of three vessel CAD and Aortic Atherosclerosis. Pt. Is on statin therapy   Of note, patient is bringing her husband to see [name] Friday. She was wondering if she could get the UA done at that time if [name] is in agreement.

## 2021-10-22 NOTE — Telephone Encounter (Signed)
Please schedule pt for lab visit on Friday for UA and culture (dx abnormal renal imaging)

## 2021-10-23 ENCOUNTER — Other Ambulatory Visit: Payer: Self-pay

## 2021-10-23 DIAGNOSIS — R9341 Abnormal radiologic findings on diagnostic imaging of renal pelvis, ureter, or bladder: Secondary | ICD-10-CM

## 2021-10-23 NOTE — Telephone Encounter (Signed)
Pt is coming in tomorrow morning

## 2021-10-23 NOTE — Telephone Encounter (Signed)
Order placed for 12 mth f/u lung cancer screening CT scan.

## 2021-10-24 ENCOUNTER — Ambulatory Visit (INDEPENDENT_AMBULATORY_CARE_PROVIDER_SITE_OTHER): Payer: Medicare PPO | Admitting: Family Medicine

## 2021-10-24 ENCOUNTER — Other Ambulatory Visit: Payer: Self-pay

## 2021-10-24 ENCOUNTER — Other Ambulatory Visit: Payer: Medicare PPO

## 2021-10-24 DIAGNOSIS — R9341 Abnormal radiologic findings on diagnostic imaging of renal pelvis, ureter, or bladder: Secondary | ICD-10-CM | POA: Diagnosis not present

## 2021-10-24 LAB — POCT URINALYSIS DIPSTICK
Glucose, UA: NEGATIVE
Leukocytes, UA: NEGATIVE
Protein, UA: NEGATIVE
Spec Grav, UA: 1.025 (ref 1.010–1.025)
Urobilinogen, UA: 0.2 E.U./dL
pH, UA: 6 (ref 5.0–8.0)

## 2021-10-24 NOTE — Progress Notes (Signed)
Pt seen results via my chart  

## 2021-10-24 NOTE — Progress Notes (Signed)
Pt dropped urine sample as directed for an abnormal renal scan

## 2021-10-27 ENCOUNTER — Telehealth: Payer: Self-pay

## 2021-10-27 LAB — URINE CULTURE
MICRO NUMBER:: 13565314
SPECIMEN QUALITY:: ADEQUATE

## 2021-10-27 MED ORDER — CEPHALEXIN 500 MG PO CAPS: 500.0000 mg | ORAL_CAPSULE | Freq: Two times a day (BID) | ORAL | 0 refills | Status: AC

## 2021-10-27 NOTE — Telephone Encounter (Signed)
Informed pt of the urine results

## 2021-10-28 ENCOUNTER — Telehealth: Payer: Self-pay

## 2021-10-30 ENCOUNTER — Other Ambulatory Visit: Payer: Self-pay

## 2021-10-30 DIAGNOSIS — R001 Bradycardia, unspecified: Secondary | ICD-10-CM

## 2021-10-30 DIAGNOSIS — R072 Precordial pain: Secondary | ICD-10-CM

## 2021-10-30 NOTE — Addendum Note (Signed)
Addended by: Barrington Ellison A on: 10/30/2021 09:56 PM   Modules accepted: Orders

## 2021-10-31 NOTE — Telephone Encounter (Signed)
Sent this to [employer]

## 2021-11-03 NOTE — Telephone Encounter (Signed)
I have changed this from No Show to Complete

## 2021-11-14 ENCOUNTER — Telehealth: Payer: Self-pay

## 2021-11-14 NOTE — Telephone Encounter (Signed)
CND biopsy benefit verification results received. Patients out of pocket cost is approximately $40.

## 2021-11-14 NOTE — Telephone Encounter (Signed)
Patient is sch for 11-24-21 at 2:30 per Dr Tat

## 2021-11-15 ENCOUNTER — Other Ambulatory Visit: Payer: Self-pay | Admitting: Family Medicine

## 2021-11-18 ENCOUNTER — Ambulatory Visit (INDEPENDENT_AMBULATORY_CARE_PROVIDER_SITE_OTHER): Payer: Medicare PPO

## 2021-11-18 DIAGNOSIS — R072 Precordial pain: Secondary | ICD-10-CM | POA: Diagnosis not present

## 2021-11-18 LAB — EXERCISE TOLERANCE TEST
Angina Index: 0
Duke Treadmill Score: -5
Estimated workload: 6.2
Exercise duration (min): 5 min
Exercise duration (sec): 0 s
MPHR: 149 {beats}/min
Peak HR: 127 {beats}/min
Percent HR: 85 %
RPE: 16
Rest HR: 58 {beats}/min
ST Depression (mm): 2 mm

## 2021-11-24 ENCOUNTER — Ambulatory Visit: Payer: Medicare PPO | Admitting: Neurology

## 2021-11-24 DIAGNOSIS — R258 Other abnormal involuntary movements: Secondary | ICD-10-CM

## 2021-11-24 DIAGNOSIS — G20 Parkinson's disease: Secondary | ICD-10-CM | POA: Diagnosis not present

## 2021-11-24 NOTE — Procedures (Signed)
Punch Biopsy Procedure Note  Preprocedure Diagnosis: Bradykinesia; Tremor;   Postprocedure Diagnosis: same  Locations: Site 1: left, posterior shoulder;  Site 2: above, left knee;  Site 3: above, left foot  Indications: r/o alpha synucleinopathy  Anesthesia: 2.5 mL Lidocaine 1% without epinephrine without added sodium bicarbonate  Procedure Details Patient informed of the risks (including but not limited to bleeding, pain, infection, scar and infection) and benefits of the procedure.  Informed consent obtained.  The areas which were chosen for biopsy, as above, and surrounding areas were given a sterile prep using betadyne and draped in the usual sterile fashion. The skin was then stretched perpendicular to the skin tension lines and sample removed using the 3 mm punch. Pressure applied, hemostasis achieved.   Dressing applied. The specimen(s) was sent for pathologic examination. The patient tolerated the procedure well.  Estimated Blood Loss: 0 ml  Condition: Stable  Complications: none.  Plan: 1. Instructed to keep the wound dry and covered for 24-48h and clean thereafter. 2. Warning signs of infection were reviewed.

## 2021-11-25 ENCOUNTER — Telehealth: Payer: Self-pay | Admitting: Family Medicine

## 2021-11-25 NOTE — Telephone Encounter (Signed)
error 

## 2021-11-26 NOTE — H&P (View-Only) (Signed)
Cardiology Office Note Date:  11/27/2021  Patient ID:  Lacey [name], DOB 01/02/1951, MRN [id number] PCP:  Midge Minium, MD  Cardiologist: Dr. Irish Lack Electrophysiologist: Dr. Caryl Comes    Chief Complaint:  discuss cath  History of Present Illness: Lacey [name] is a 71 y.o. female with history of HTN, HLD, DM, ATach, HFpEF  She last saw dr. Caryl Comes Aug 2022, discussed edema, some SOB, labile BPs, no palpitations.  Verapamil continued, lasix increased, encouraged to stop smoking  She saw Dr. Irish Lack 07/03/21, had some c/o CP, planned for coronary CT  CT noted + calcium but no obstructive CAD, recommended lipid management and an annual visit  She saw A. Tillery, PA-C, 09/10/21, discussed an observation of brady in the 30s without obvious symptoms on propanolol (prescribed for tremor) that was subsequently stopped , though generally remained fatigued and planned for monitoring.  Maintained on verapamil for her AT.  Monitor was unremarkable without significant bradycardia, Dr. Caryl Comes recommended ETT to evaluate her fatigue further this was abnormal and in review with cardiology recommended cath  [facility] with neurology regularly for her tremor (unclear if felt to be early Parkinon's, I think note suggests not), hx of stroke, gait instability Seems BB helped her tremor, though resulted in bradycardia  TODAY Symptoms details are a bit difficult to nail down, she is a little tangential, and timing/onset/frequency of her symptoms is difficult, maybe started the beginning of the year Not daily, but more then adjust a few episodes. She has 2 CP symptoms, the more frequent is a heavy sensation this has occurred at rest and exertion, lasts a little while, maybe a few minutes. No radiation, perhaps associated with a little nausea. Not particularly opressive, but she notices it.  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  No SOB No near syncope or syncope. No palpitations  She is tired, no energy, this is years , probably about 3 years that hse had been feeling so fatigued. Not lightheaded, not dizzy, not physically weak, but no energy and tired. Her sleep is interrupted, gets up 2-3 times every night to use the bathroom, otherwise feels like she sleeps "OK" Has been test for apnea some time ago, recalls maybe mmildly abnormal    Past Medical History:  Diagnosis Date   Adenomatous colon polyp    hyperplastic   Anemia    Anginal pain ([place])    not recent   Arthritis    NECK, KNEES, FINGERS, TOES   Atrial tachycardia ([place]) CARDIOLOGIST - DR Caryl Comes (LAST VISIT AUG 2012)   Echo 12/11: EF 55-60%, Mild LVH, grade 1 diast dysfxn;   holter 1/12: ATach   Atypical chest pain    a. 07/2004 Cath: Clean cors;  b. 04/2010 Myoview: EF 63%, no ischemia   Blood transfusion without reported diagnosis 1969   Chronic kidney disease    left kidney small    Diabetes mellitus, type 2 (HCC)    ORAL AND INSULIN MEDS (LAST A1C  7.3)   Diverticulosis    Erythema CURRENT-- CLOSED ABD. WALL ABSCESS   PER PCP NOTE (03-31-11)- MRSA-- TAKES DOXYCYCLINE   GERD (gastroesophageal reflux disease)    CONTROLLED W/ PREVACID   History of kidney stones 2012   Hyperlipidemia    Hypertension    Insomnia    TAKES MEDS PRN  Neuropathy, peripheral    Obesity (BMI 30-39.9) 02/19/2015   Pneumonia    as child   Post op infection 11/07/12   left bunionectomy   Restless leg syndrome    Sepsis, urinary HISTORY - 2004   Sleep apnea    no cpap    Thyroid disease    Tremor    Vitamin D deficiency     Past Surgical History:  Procedure Laterality Date   ABDOMINAL HYSTERECTOMY  1995   ovaries remain   BUNIONECTOMY Left 10/24/2012   BUNIONECTOMY Right 05/17/2017   CARDIAC CATHETERIZATION  07/10/2004   CARPAL TUNNEL RELEASE  2000   RIGHT    CATARACT EXTRACTION, BILATERAL     CERVICAL FUSION  10/12/2007   C5 - 7   COLONOSCOPY     CYSTOSCOPY WITH RETROGRADE PYELOGRAM, URETEROSCOPY AND STENT PLACEMENT Left 11/28/2012   Procedure: LEFT RETROGRADE PYELOGRAM, LEFT URETEROSCOPY, ;  Surgeon: Mark C Ottelin, MD;  Location: [facility];  Service: Urology;  Laterality: Left;   CYSTOSCOPY/RETROGRADE/URETEROSCOPY/STONE EXTRACTION WITH BASKET  X2 2004 & 2009   LEFT    GASTRIC BYPASS  1981   KNEE ARTHROSCOPY  12/2010   LEFT   LAPAROSCOPIC CHOLECYSTECTOMY  2001   left thumb joint surgery  2013   PERCUTANEOUS NEPHROSTOLITHOTOMY  02/27/2011   LEFT   POLYPECTOMY     RIGHT THUMB JOINT SURG.  09/2009   TRIGGER FINGER RELEASE  2010   RIGHT THUMB   UPPER GASTROINTESTINAL ENDOSCOPY     URETERAL STENT PLACEMENT  X2  2004  &  2009   LEFT   URETEROSCOPY  04/06/2011   Procedure: URETEROSCOPY;  Surgeon: Mark C Ottelin, MD;  Location: [facility];  Service: Urology;  Laterality: Left;  L Ureteroscopy Laser Litho & Stent     Current Outpatient Medications  Medication Sig Dispense Refill   albuterol (VENTOLIN HFA) 108 (90 Base) MCG/ACT inhaler Inhale 2 puffs into the lungs every 6 (six) hours as needed for wheezing or shortness of breath. 8 g 6   Alcohol Swabs (DROPSAFE ALCOHOL PREP) 70 % PADS USE AS DIRECTED  AS NEEDED. 200 each 3   aspirin EC 81 MG tablet Take 81 mg by mouth daily.     atorvastatin (LIPITOR) 20 MG tablet TAKE 1 TABLET BY MOUTH EVERY DAY 90 tablet 1   Blood Glucose Monitoring Suppl (TRUE METRIX METER) w/Device KIT USE AS DIRECTED 1 kit 1   cholecalciferol (VITAMIN D) 25 MCG (1000 UNIT) tablet Take 1,000 Units by mouth daily.     clonazePAM (KLONOPIN) 1 MG tablet TAKE 1 TABLET AT BEDTIME FOR RESTLESS LEGS SYNDROME 90 tablet 1   diclofenac Sodium (VOLTAREN) 1 % GEL APPLY 2 GRAMS TO AFFECTED AREA 4 TIMES A DAY (Patient taking differently: Apply 1 Application topically 4 (four) times daily as needed (pain).) 300 g 1    fenofibrate 160 MG tablet TAKE 1 TABLET EVERY DAY 90 tablet 3   Fluticasone-Umeclidin-Vilant (TRELEGY ELLIPTA) 100-62.5-25 MCG/ACT AEPB Inhale 1 puff into the lungs daily. 28 each 11   furosemide (LASIX) 20 MG tablet Take by mouth.     gabapentin (NEURONTIN) 300 MG capsule Take 3 capsules (900 mg total) by mouth 3 (three) times daily. 270 capsule 11   glucose blood (TRUE METRIX BLOOD GLUCOSE TEST) test strip Use as instructed to test blood sugar 4 times daily. Dx. E11.9 400 each 3   insulin aspart (NOVOLOG FLEXPEN) 100 UNIT/ML FlexPen INJECT 25 UNITS SUBCUTANEOUSLY THREE TIMES DAILY WITH MEALS (  Patient taking differently: Inject 25-27 Units into the skin 3 (three) times daily with meals.) 75 mL 0   Insulin Pen Needle (DROPLET PEN NEEDLES) 31G X 8 MM MISC USE 4 TIMES DAILY 400 each 6   Lancets Super Thin 28G MISC Please use as directed to test sugars 4 times daily. Dx. E11.9 420 each 3   lansoprazole (PREVACID) 15 MG capsule Take 15 mg by mouth daily.     LANTUS SOLOSTAR 100 UNIT/ML Solostar Pen INJECT 60 UNITS UNDER THE SKIN AT BEDTIME 60 mL 0   levothyroxine (SYNTHROID) 50 MCG tablet TAKE 1 TABLET BY MOUTH EVERY DAY BEFORE BREAKFAST 90 tablet 1   MYRBETRIQ 50 MG TB24 tablet Take 50 mg by mouth daily.     primidone (MYSOLINE) 50 MG tablet Take 1 tablet (50 mg total) by mouth 2 (two) times daily. 180 tablet 1   traZODone (DESYREL) 100 MG tablet TAKE 1 TABLET AT BEDTIME 90 tablet 3   valsartan (DIOVAN) 320 MG tablet TAKE 1 TABLET EVERY DAY 90 tablet 2   verapamil (CALAN-SR) 120 MG CR tablet TAKE 1 TABLET BY MOUTH AT BEDTIME. 90 tablet 1   loperamide (IMODIUM A-D) 2 MG tablet Take 2 mg by mouth 4 (four) times daily as needed for diarrhea or loose stools.     neomycin-bacitracin-polymyxin [phone] OINT Apply 1 Application topically daily.     No current facility-administered medications for this visit.    Allergies:   Patient has no known allergies.   Social History:  The patient  reports  that she quit smoking about 6 weeks ago. Her smoking use included cigarettes. She has a 10.75 pack-year smoking history. She has never used smokeless tobacco. She reports that she does not drink alcohol and does not use drugs.   Family History:  The patient's family history includes Breast cancer in an other family member; Colon cancer (age of onset: 95) in an other family member; Colon polyps in her sister and sister; Diabetes in an other family member; Heart attack in her father; Heart disease in her sister and another family member; Hyperlipidemia in her mother; Hypertension in her mother, sister, sister, sister, and sister; Lung cancer (age of onset: 73) in her sister; Lung disease in her father.  ROS:  Please see the history of present illness.    All other systems are reviewed and otherwise negative.   PHYSICAL EXAM:  VS:  BP (!) 156/70 (BP Location: Left Arm, Patient Position: Sitting, Cuff Size: Large)   Pulse 66   Ht _0  (1.575 m)   Wt 232 lb 9.6 oz (105.5 kg)   LMP 06/06/1993   SpO2 94%   BMI 42.54 kg/m  BMI: Body mass index is 42.54 kg/m. Well nourished, well developed, in no acute distress HEENT: normocephalic, atraumatic Neck: no JVD, carotid bruits or masses Cardiac:  RRR; no significant murmurs, no rubs, or gallops Lungs:  CTA b/l, no wheezing, rhonchi or rales Abd: soft, nontender MS: no deformity or atrophy Ext: no edema Skin: warm and dry, no rash Neuro:  No gross deficits appreciated Psych: euthymic mood, full affect   EKG:  Done today and reviewed by myself shows  SR/sinus arrhythmia 66bpm, unchanged   11/18/21: ETT  Exercise capacity was moderately impaired. Patient exercised for 5 min and 0 sec. Maximum HR of 127 bpm. MPHR 85.0 %. Peak METS 6.2 . Elevated blood pressure and normal heart rate response noted during stress. Heart rate recovery was normal.   2.0 mm  of horizontal ST depression in the inferior and inferolateral leads (II, III, aVF, V5 and V6) was  noted. ST depression began during stress and ended during recovery. ST deviation persisted during recovery. ECG was interpretable and conclusive. The ECG was positive for ischemia.   Prior study not available for comparison.   Positive for ischemia, with 2 mm ST depressions (horizontal to downsloping) in inferior and inferolateral leads that persisted throughout recovery.  10/01/21: Monitor Findings HR  avg 62 Min 45-Max 197  PVCs Rare, less than 1%   PACs 1.6% SVT Nonsustained 8 episodes; fastest 197 bpm for 8 beats; longest for 8 beats Symptoms:            None    Triggered             Conclusions: Heart rate excursion seems limited in this 71 yo with nothing in the trends greater than 100bpm x with short sSVT  07/17/2021: Coronary CTa IMPRESSION: 1. Coronary calcium score of 401. This was 38 percentile for age and sex matched control. 2. Normal coronary origin with right dominance. 3. Multivessel Mild non-obstructive coronary artery disease. CAD-RADS 2. Mild non-obstructive CAD (25-49%). Consider non-atherosclerotic causes of chest pain. Consider preventive therapy and risk factor modification. 4. Mild Mitral annular calcification. 5. Aortic atherosclerosis.  10/17/2014: stress myoview Myocardial perfusion is normal. The study is normal. This is a low risk study. Overall left ventricular systolic function was normal. LV cavity size is normal. The left ventricular ejection fraction is normal (55-65%). There are no significant changes in comparison to the prior study.   04/30/2010: TTE  Study Conclusions   Left ventricle: The cavity size was normal. Wall thickness was   increased in a pattern of mild LVH. Systolic function was normal.   The estimated ejection fraction was in the range of 55% to 60%. Wall   motion was normal; there were no regional wall motion abnormalities.   Doppler parameters are consistent with abnormal left ventricular   relaxation (grade 1 diastolic  dysfunction).  Recent Labs: 06/20/2021: ALT 7; Hemoglobin 14.5; Platelets 162.0; TSH 2.73 09/24/2021: BUN 22; Creatinine, Ser 1.07; Potassium 4.5 Hemolysis seen..; Sodium 139  06/20/2021: Cholesterol 139; HDL 41.90; LDL Cholesterol 72; Total CHOL/HDL Ratio 3; Triglycerides 124.0; VLDL 24.8   CrCl cannot be calculated (Patient's most recent lab result is older than the maximum 21 days allowed.).   Wt Readings from Last 3 Encounters:  11/27/21 232 lb 9.6 oz (105.5 kg)  11/24/21 231 lb (104.8 kg)  10/21/21 227 lb 12.8 oz (103.3 kg)     Other studies reviewed: Additional studies/records reviewed today include: summarized above  ASSESSMENT AND PLAN:  ATach well controlled with verapamil Discussed perhaps transitioning to BB alone for her tremor though she says the new medication (primidone) works even better anyway  HFpEF No exam findings or symptoms of volume OL  HTN A recheck is 148/70 Follow post cath  Fatigue Chronic, at least 3 years, perhaps 2/2 interrupted sleep She had good HR response to exercise.  5. Abnormal ETT CT noted Ca++ though no obstructive disease In d/w cardiology, if symptoms concerning would still pursue cath The patient has both some typical/atypical features of her CP Discussed rational for cath, she reports having a cath >10- years or so ago We reviewed the procedure,potential risks/benefits, she would like to proceed.   Disposition: F/u with Dr. Caryl Comes as scheduled  Current medicines are reviewed at length with the patient today.  The patient did not have  any concerns regarding medicines.  Venetia Night, PA-C 11/27/2021 12:31 PM     [place] [address] [phone] (office)  [phone] (fax)

## 2021-11-26 NOTE — Progress Notes (Unsigned)
Cardiology Office Note Date:  11/27/2021  Patient ID:  Lacey [name], DOB 01/02/1951, MRN [id number] PCP:  Midge Minium, MD  Cardiologist: Dr. Irish Lack Electrophysiologist: Dr. Caryl Comes    Chief Complaint:  discuss cath  History of Present Illness: Lacey [name] is a 71 y.o. female with history of HTN, HLD, DM, ATach, HFpEF  She last saw dr. Caryl Comes Aug 2022, discussed edema, some SOB, labile BPs, no palpitations.  Verapamil continued, lasix increased, encouraged to stop smoking  She saw Dr. Irish Lack 07/03/21, had some c/o CP, planned for coronary CT  CT noted + calcium but no obstructive CAD, recommended lipid management and an annual visit  She saw A. Tillery, PA-C, 09/10/21, discussed an observation of brady in the 30s without obvious symptoms on propanolol (prescribed for tremor) that was subsequently stopped , though generally remained fatigued and planned for monitoring.  Maintained on verapamil for her AT.  Monitor was unremarkable without significant bradycardia, Dr. Caryl Comes recommended ETT to evaluate her fatigue further this was abnormal and in review with cardiology recommended cath  [facility] with neurology regularly for her tremor (unclear if felt to be early Parkinon's, I think note suggests not), hx of stroke, gait instability Seems BB helped her tremor, though resulted in bradycardia  TODAY Symptoms details are a bit difficult to nail down, she is a little tangential, and timing/onset/frequency of her symptoms is difficult, maybe started the beginning of the year Not daily, but more then adjust a few episodes. She has 2 CP symptoms, the more frequent is a heavy sensation this has occurred at rest and exertion, lasts a little while, maybe a few minutes. No radiation, perhaps associated with a little nausea. Not particularly opressive, but she notices it.  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  No SOB No near syncope or syncope. No palpitations  She is tired, no energy, this is years , probably about 3 years that hse had been feeling so fatigued. Not lightheaded, not dizzy, not physically weak, but no energy and tired. Her sleep is interrupted, gets up 2-3 times every night to use the bathroom, otherwise feels like she sleeps "OK" Has been test for apnea some time ago, recalls maybe mmildly abnormal    Past Medical History:  Diagnosis Date   Adenomatous colon polyp    hyperplastic   Anemia    Anginal pain ([place])    not recent   Arthritis    NECK, KNEES, FINGERS, TOES   Atrial tachycardia ([place]) CARDIOLOGIST - DR Caryl Comes (LAST VISIT AUG 2012)   Echo 12/11: EF 55-60%, Mild LVH, grade 1 diast dysfxn;   holter 1/12: ATach   Atypical chest pain    a. 07/2004 Cath: Clean cors;  b. 04/2010 Myoview: EF 63%, no ischemia   Blood transfusion without reported diagnosis 1969   Chronic kidney disease    left kidney small    Diabetes mellitus, type 2 (HCC)    ORAL AND INSULIN MEDS (LAST A1C  7.3)   Diverticulosis    Erythema CURRENT-- CLOSED ABD. [place]ALL ABSCESS   PER PCP NOTE (03-31-11)- MRSA-- TAKES DOXYCYCLINE   GERD (gastroesophageal reflux disease)    CONTROLLED W/ PREVACID   History of kidney stones 2012   Hyperlipidemia    Hypertension    Insomnia    TAKES MEDS PRN  Neuropathy, peripheral    Obesity (BMI 30-39.9) 02/19/2015   Pneumonia    as child   Post op infection 11/07/12   left bunionectomy   Restless leg syndrome    Sepsis, urinary HISTORY - 2004   Sleep apnea    no cpap    Thyroid disease    Tremor    Vitamin D deficiency     Past Surgical History:  Procedure Laterality Date   ABDOMINAL HYSTERECTOMY  1995   ovaries remain   BUNIONECTOMY Left 10/24/2012   BUNIONECTOMY Right 05/17/2017   CARDIAC CATHETERIZATION  07/10/2004   CARPAL TUNNEL RELEASE  2000   RIGHT    CATARACT EXTRACTION, BILATERAL     CERVICAL FUSION  10/12/2007   C5 - 7   COLONOSCOPY     CYSTOSCOPY WITH RETROGRADE PYELOGRAM, URETEROSCOPY AND STENT PLACEMENT Left 11/28/2012   Procedure: LEFT RETROGRADE PYELOGRAM, LEFT URETEROSCOPY, ;  Surgeon: Claybon Jabs, MD;  Location: [facility];  Service: Urology;  Laterality: Left;   CYSTOSCOPY/RETROGRADE/URETEROSCOPY/STONE EXTRACTION WITH BASKET  X2 2004 & 2009   LEFT    GASTRIC BYPASS  1981   KNEE ARTHROSCOPY  12/2010   LEFT   LAPAROSCOPIC CHOLECYSTECTOMY  2001   left thumb joint surgery  2013   PERCUTANEOUS NEPHROSTOLITHOTOMY  02/27/2011   LEFT   POLYPECTOMY     RIGHT THUMB JOINT SURG.  09/2009   TRIGGER FINGER RELEASE  2010   RIGHT THUMB   UPPER GASTROINTESTINAL ENDOSCOPY     URETERAL STENT PLACEMENT  X2  2004  &  2009   LEFT   URETEROSCOPY  04/06/2011   Procedure: URETEROSCOPY;  Surgeon: Claybon Jabs, MD;  Location: [facility];  Service: Urology;  Laterality: Left;  L Ureteroscopy Laser Litho & Stent     Current Outpatient Medications  Medication Sig Dispense Refill   albuterol (VENTOLIN HFA) 108 (90 Base) MCG/ACT inhaler Inhale 2 puffs into the lungs every 6 (six) hours as needed for wheezing or shortness of breath. 8 g 6   Alcohol Swabs (DROPSAFE ALCOHOL PREP) 70 % PADS USE AS DIRECTED  AS NEEDED. 200 each 3   aspirin EC 81 MG tablet Take 81 mg by mouth daily.     atorvastatin (LIPITOR) 20 MG tablet TAKE 1 TABLET BY MOUTH EVERY DAY 90 tablet 1   Blood Glucose Monitoring Suppl (TRUE METRIX METER) w/Device KIT USE AS DIRECTED 1 kit 1   cholecalciferol (VITAMIN D) 25 MCG (1000 UNIT) tablet Take 1,000 Units by mouth daily.     clonazePAM (KLONOPIN) 1 MG tablet TAKE 1 TABLET AT BEDTIME FOR RESTLESS LEGS SYNDROME 90 tablet 1   diclofenac Sodium (VOLTAREN) 1 % GEL APPLY 2 GRAMS TO AFFECTED AREA 4 TIMES A DAY (Patient taking differently: Apply 1 Application topically 4 (four) times daily as needed (pain).) 300 g 1    fenofibrate 160 MG tablet TAKE 1 TABLET EVERY DAY 90 tablet 3   Fluticasone-Umeclidin-Vilant (TRELEGY ELLIPTA) 100-62.5-25 MCG/ACT AEPB Inhale 1 puff into the lungs daily. 28 each 11   furosemide (LASIX) 20 MG tablet Take by mouth.     gabapentin (NEURONTIN) 300 MG capsule Take 3 capsules (900 mg total) by mouth 3 (three) times daily. 270 capsule 11   glucose blood (TRUE METRIX BLOOD GLUCOSE TEST) test strip Use as instructed to test blood sugar 4 times daily. Dx. E11.9 400 each 3   insulin aspart (NOVOLOG FLEXPEN) 100 UNIT/ML FlexPen INJECT 25 UNITS SUBCUTANEOUSLY THREE TIMES DAILY WITH MEALS (  Patient taking differently: Inject 25-27 Units into the skin 3 (three) times daily with meals.) 75 mL 0   Insulin Pen Needle (DROPLET PEN NEEDLES) 31G X 8 MM MISC USE 4 TIMES DAILY 400 each 6   Lancets Super Thin 28G MISC Please use as directed to test sugars 4 times daily. Dx. E11.9 420 each 3   lansoprazole (PREVACID) 15 MG capsule Take 15 mg by mouth daily.     LANTUS SOLOSTAR 100 UNIT/ML Solostar Pen INJECT 60 UNITS UNDER THE SKIN AT BEDTIME 60 mL 0   levothyroxine (SYNTHROID) 50 MCG tablet TAKE 1 TABLET BY MOUTH EVERY DAY BEFORE BREAKFAST 90 tablet 1   MYRBETRIQ 50 MG TB24 tablet Take 50 mg by mouth daily.     primidone (MYSOLINE) 50 MG tablet Take 1 tablet (50 mg total) by mouth 2 (two) times daily. 180 tablet 1   traZODone (DESYREL) 100 MG tablet TAKE 1 TABLET AT BEDTIME 90 tablet 3   valsartan (DIOVAN) 320 MG tablet TAKE 1 TABLET EVERY DAY 90 tablet 2   verapamil (CALAN-SR) 120 MG CR tablet TAKE 1 TABLET BY MOUTH AT BEDTIME. 90 tablet 1   loperamide (IMODIUM A-D) 2 MG tablet Take 2 mg by mouth 4 (four) times daily as needed for diarrhea or loose stools.     neomycin-bacitracin-polymyxin [phone] OINT Apply 1 Application topically daily.     No current facility-administered medications for this visit.    Allergies:   Patient has no known allergies.   Social History:  The patient  reports  that she quit smoking about 6 weeks ago. Her smoking use included cigarettes. She has a 10.75 pack-year smoking history. She has never used smokeless tobacco. She reports that she does not drink alcohol and does not use drugs.   Family History:  The patient's family history includes Breast cancer in an other family member; Colon cancer (age of onset: 95) in an other family member; Colon polyps in her sister and sister; Diabetes in an other family member; Heart attack in her father; Heart disease in her sister and another family member; Hyperlipidemia in her mother; Hypertension in her mother, sister, sister, sister, and sister; Lung cancer (age of onset: 73) in her sister; Lung disease in her father.  ROS:  Please see the history of present illness.    All other systems are reviewed and otherwise negative.   PHYSICAL EXAM:  VS:  BP (!) 156/70 (BP Location: Left Arm, Patient Position: Sitting, Cuff Size: Large)   Pulse 66   Ht _0  (1.575 m)   Wt 232 lb 9.6 oz (105.5 kg)   LMP 06/06/1993   SpO2 94%   BMI 42.54 kg/m  BMI: Body mass index is 42.54 kg/m. Well nourished, well developed, in no acute distress HEENT: normocephalic, atraumatic Neck: no JVD, carotid bruits or masses Cardiac:  RRR; no significant murmurs, no rubs, or gallops Lungs:  CTA b/l, no wheezing, rhonchi or rales Abd: soft, nontender MS: no deformity or atrophy Ext: no edema Skin: warm and dry, no rash Neuro:  No gross deficits appreciated Psych: euthymic mood, full affect   EKG:  Done today and reviewed by myself shows  SR/sinus arrhythmia 66bpm, unchanged   11/18/21: ETT  Exercise capacity was moderately impaired. Patient exercised for 5 min and 0 sec. Maximum HR of 127 bpm. MPHR 85.0 %. Peak METS 6.2 . Elevated blood pressure and normal heart rate response noted during stress. Heart rate recovery was normal.   2.0 mm  of horizontal ST depression in the inferior and inferolateral leads (II, III, aVF, V5 and V6) was  noted. ST depression began during stress and ended during recovery. ST deviation persisted during recovery. ECG was interpretable and conclusive. The ECG was positive for ischemia.   Prior study not available for comparison.   Positive for ischemia, with 2 mm ST depressions (horizontal to downsloping) in inferior and inferolateral leads that persisted throughout recovery.  10/01/21: Monitor Findings HR  avg 62 Min 45-Max 197  PVCs Rare, less than 1%   PACs 1.6% SVT Nonsustained 8 episodes; fastest 197 bpm for 8 beats; longest for 8 beats Symptoms:            None    Triggered             Conclusions: Heart rate excursion seems limited in this 71 yo with nothing in the trends greater than 100bpm x with short sSVT  07/17/2021: Coronary CTa IMPRESSION: 1. Coronary calcium score of 401. This was 38 percentile for age and sex matched control. 2. Normal coronary origin with right dominance. 3. Multivessel Mild non-obstructive coronary artery disease. CAD-RADS 2. Mild non-obstructive CAD (25-49%). Consider non-atherosclerotic causes of chest pain. Consider preventive therapy and risk factor modification. 4. Mild Mitral annular calcification. 5. Aortic atherosclerosis.  10/17/2014: stress myoview Myocardial perfusion is normal. The study is normal. This is a low risk study. Overall left ventricular systolic function was normal. LV cavity size is normal. The left ventricular ejection fraction is normal (55-65%). There are no significant changes in comparison to the prior study.   04/30/2010: TTE  Study Conclusions   Left ventricle: The cavity size was normal. Wall thickness was   increased in a pattern of mild LVH. Systolic function was normal.   The estimated ejection fraction was in the range of 55% to 60%. Wall   motion was normal; there were no regional wall motion abnormalities.   Doppler parameters are consistent with abnormal left ventricular   relaxation (grade 1 diastolic  dysfunction).  Recent Labs: 06/20/2021: ALT 7; Hemoglobin 14.5; Platelets 162.0; TSH 2.73 09/24/2021: BUN 22; Creatinine, Ser 1.07; Potassium 4.5 Hemolysis seen..; Sodium 139  06/20/2021: Cholesterol 139; HDL 41.90; LDL Cholesterol 72; Total CHOL/HDL Ratio 3; Triglycerides 124.0; VLDL 24.8   CrCl cannot be calculated (Patient's most recent lab result is older than the maximum 21 days allowed.).   Wt Readings from Last 3 Encounters:  11/27/21 232 lb 9.6 oz (105.5 kg)  11/24/21 231 lb (104.8 kg)  10/21/21 227 lb 12.8 oz (103.3 kg)     Other studies reviewed: Additional studies/records reviewed today include: summarized above  ASSESSMENT AND PLAN:  ATach well controlled with verapamil Discussed perhaps transitioning to BB alone for her tremor though she says the new medication (primidone) works even better anyway  HFpEF No exam findings or symptoms of volume OL  HTN A recheck is 148/70 Follow post cath  Fatigue Chronic, at least 3 years, perhaps 2/2 interrupted sleep She had good HR response to exercise.  5. Abnormal ETT CT noted Ca++ though no obstructive disease In d/w cardiology, if symptoms concerning would still pursue cath The patient has both some typical/atypical features of her CP Discussed rational for cath, she reports having a cath >10- years or so ago We reviewed the procedure,potential risks/benefits, she would like to proceed.   Disposition: F/u with Dr. Caryl Comes as scheduled  Current medicines are reviewed at length with the patient today.  The patient did not have  any concerns regarding medicines.  Venetia Night, PA-C 11/27/2021 12:31 PM     [place] [address] [phone] (office)  [phone] (fax)

## 2021-11-27 ENCOUNTER — Encounter: Payer: Self-pay | Admitting: Physician Assistant

## 2021-11-27 ENCOUNTER — Ambulatory Visit: Payer: Medicare PPO | Admitting: Physician Assistant

## 2021-11-27 VITALS — BP 156/70 | HR 66 | Ht 62.0 in | Wt 232.6 lb

## 2021-11-27 DIAGNOSIS — I10 Essential (primary) hypertension: Secondary | ICD-10-CM

## 2021-11-27 DIAGNOSIS — I5032 Chronic diastolic (congestive) heart failure: Secondary | ICD-10-CM | POA: Diagnosis not present

## 2021-11-27 DIAGNOSIS — R072 Precordial pain: Secondary | ICD-10-CM

## 2021-11-27 DIAGNOSIS — I471 Supraventricular tachycardia: Secondary | ICD-10-CM | POA: Diagnosis not present

## 2021-11-27 DIAGNOSIS — R5383 Other fatigue: Secondary | ICD-10-CM

## 2021-11-27 NOTE — Patient Instructions (Signed)
Medication Instructions:  Your physician recommends that you continue on your current medications as directed. Please refer to the Current Medication list given to you today.  *If you need a refill on your cardiac medications before your next appointment, please call your pharmacy*   Lab Work: TODAY: BMET, CBC  If you have labs (blood work) drawn today and your tests are completely normal, you will receive your results only by: [place] (if you have MyChart) OR A paper copy in the mail If you have any lab test that is abnormal or we need to change your treatment, we will call you to review the results.   Follow-Up: At [facility], you and your health needs are our priority.  As part of our continuing mission to provide you with exceptional heart care, we have created designated Provider Care Teams.  These Care Teams include your primary Cardiologist (physician) and Advanced Practice Providers (APPs -  Physician Assistants and Nurse Practitioners) who all work together to provide you with the care you need, when you need it.   Your next appointment:   As scheduled  Other Instructions See letter for Cath Instructions  Important Information About Sugar

## 2021-11-28 LAB — CBC
Hematocrit: 38.7 % (ref 34.0–46.6)
Hemoglobin: 12.7 g/dL (ref 11.1–15.9)
MCH: 31.1 pg (ref 26.6–33.0)
MCHC: 32.8 g/dL (ref 31.5–35.7)
MCV: 95 fL (ref 79–97)
Platelets: 179 10*3/uL (ref 150–450)
RBC: 4.09 x10E6/uL (ref 3.77–5.28)
RDW: 12.6 % (ref 11.7–15.4)
WBC: 6.9 10*3/uL (ref 3.4–10.8)

## 2021-11-28 LAB — BASIC METABOLIC PANEL
BUN/Creatinine Ratio: 16 (ref 12–28)
BUN: 15 mg/dL (ref 8–27)
CO2: 24 mmol/L (ref 20–29)
Calcium: 9.2 mg/dL (ref 8.7–10.3)
Chloride: 104 mmol/L (ref 96–106)
Creatinine, Ser: 0.95 mg/dL (ref 0.57–1.00)
Glucose: 130 mg/dL — ABNORMAL HIGH (ref 70–99)
Potassium: 4.2 mmol/L (ref 3.5–5.2)
Sodium: 142 mmol/L (ref 134–144)
eGFR: 64 mL/min/{1.73_m2} (ref >59)

## 2021-12-01 ENCOUNTER — Ambulatory Visit: Payer: Medicare PPO

## 2021-12-01 ENCOUNTER — Telehealth: Payer: Self-pay | Admitting: *Deleted

## 2021-12-01 NOTE — Telephone Encounter (Signed)
Cardiac Catheterization scheduled at [facility] for: Wednesday December 03, 2021 10 AM Arrival time and place: [facility] Main Entrance A at: 8 AM   Nothing to eat after midnight prior to procedure, clear liquids until 5 AM day of procedure.  Medication instructions: -Hold:  Lasix-AM of procedure  Insulin-AM of procedure/1/2 usual dose HS prior to procedure. -Except hold medications usual morning medications can be taken with sips of water including aspirin 81 mg.  Confirmed patient has responsible adult to drive home post procedure and be with patient first 24 hours after arriving home.  Patient reports no new symptoms concerning for COVID-19 in the past 10 days.  Reviewed procedure instructions with patient.

## 2021-12-03 ENCOUNTER — Encounter (HOSPITAL_COMMUNITY): Payer: Self-pay | Admitting: Interventional Cardiology

## 2021-12-03 ENCOUNTER — Ambulatory Visit (HOSPITAL_COMMUNITY)
Admission: RE | Disposition: A | Discharge status: Home / Self Care | Payer: Medicare PPO | Source: Home / Self Care | Attending: Interventional Cardiology

## 2021-12-03 ENCOUNTER — Other Ambulatory Visit: Payer: Self-pay

## 2021-12-03 ENCOUNTER — Ambulatory Visit (HOSPITAL_COMMUNITY)
Admission: RE | Admit: 2021-12-03 | Discharge: 2021-12-03 | Disposition: A | Discharge status: Home / Self Care | Payer: Medicare PPO | Attending: Interventional Cardiology | Admitting: Interventional Cardiology

## 2021-12-03 DIAGNOSIS — R9439 Abnormal result of other cardiovascular function study: Secondary | ICD-10-CM

## 2021-12-03 DIAGNOSIS — Z794 Long term (current) use of insulin: Secondary | ICD-10-CM | POA: Diagnosis not present

## 2021-12-03 DIAGNOSIS — I5032 Chronic diastolic (congestive) heart failure: Secondary | ICD-10-CM | POA: Insufficient documentation

## 2021-12-03 DIAGNOSIS — N189 Chronic kidney disease, unspecified: Secondary | ICD-10-CM | POA: Insufficient documentation

## 2021-12-03 DIAGNOSIS — I130 Hypertensive heart and chronic kidney disease with heart failure and stage 1 through stage 4 chronic kidney disease, or unspecified chronic kidney disease: Secondary | ICD-10-CM | POA: Insufficient documentation

## 2021-12-03 DIAGNOSIS — E1122 Type 2 diabetes mellitus with diabetic chronic kidney disease: Secondary | ICD-10-CM | POA: Insufficient documentation

## 2021-12-03 DIAGNOSIS — R5383 Other fatigue: Secondary | ICD-10-CM | POA: Insufficient documentation

## 2021-12-03 DIAGNOSIS — I2510 Atherosclerotic heart disease of native coronary artery without angina pectoris: Secondary | ICD-10-CM | POA: Insufficient documentation

## 2021-12-03 DIAGNOSIS — Z87891 Personal history of nicotine dependence: Secondary | ICD-10-CM | POA: Diagnosis not present

## 2021-12-03 DIAGNOSIS — E785 Hyperlipidemia, unspecified: Secondary | ICD-10-CM | POA: Insufficient documentation

## 2021-12-03 HISTORY — PX: LEFT HEART CATH AND CORONARY ANGIOGRAPHY: CATH118249

## 2021-12-03 LAB — GLUCOSE, CAPILLARY
Glucose-Capillary: 142 mg/dL — ABNORMAL HIGH (ref 70–99)
Glucose-Capillary: 119 mg/dL — ABNORMAL HIGH (ref 70–99)

## 2021-12-03 SURGERY — LEFT HEART CATH AND CORONARY ANGIOGRAPHY
Anesthesia: LOCAL

## 2021-12-03 MED ORDER — SODIUM CHLORIDE 0.9 % IV SOLN
250.0000 mL | INTRAVENOUS | Status: DC | PRN
Start: 2021-12-03 — End: 2021-12-03

## 2021-12-03 MED ORDER — MIDAZOLAM HCL 2 MG/2ML IJ SOLN
INTRAMUSCULAR | Status: AC
  Filled 2021-12-03: qty 2

## 2021-12-03 MED ORDER — FENTANYL CITRATE (PF) 100 MCG/2ML IJ SOLN
INTRAMUSCULAR | Status: DC | PRN
  Administered 2021-12-03: 25 ug via INTRAVENOUS

## 2021-12-03 MED ORDER — SODIUM CHLORIDE 0.9 % WEIGHT BASED INFUSION: 1.0000 mL/kg/h | INTRAVENOUS | Status: DC

## 2021-12-03 MED ORDER — HEPARIN SODIUM (PORCINE) 1000 UNIT/ML IJ SOLN
INTRAMUSCULAR | Status: AC
  Filled 2021-12-03: qty 10

## 2021-12-03 MED ORDER — SODIUM CHLORIDE 0.9 % WEIGHT BASED INFUSION
3.0000 mL/kg/h | INTRAVENOUS | Status: AC
  Administered 2021-12-03: 3 mL/kg/h via INTRAVENOUS

## 2021-12-03 MED ORDER — HEPARIN SODIUM (PORCINE) 1000 UNIT/ML IJ SOLN
INTRAMUSCULAR | Status: DC | PRN
  Administered 2021-12-03: 5000 [IU] via INTRAVENOUS

## 2021-12-03 MED ORDER — ACETAMINOPHEN 325 MG PO TABS
650.0000 mg | ORAL_TABLET | ORAL | Status: DC | PRN
Start: 2021-12-03 — End: 2021-12-03

## 2021-12-03 MED ORDER — LABETALOL HCL 5 MG/ML IV SOLN: 10.0000 mg | INTRAVENOUS | Status: DC | PRN

## 2021-12-03 MED ORDER — SODIUM CHLORIDE 0.9 % IV SOLN: INTRAVENOUS | Status: AC

## 2021-12-03 MED ORDER — IOHEXOL 350 MG/ML SOLN
INTRAVENOUS | Status: DC | PRN
  Administered 2021-12-03: 45 mL

## 2021-12-03 MED ORDER — LIDOCAINE HCL (PF) 1 % IJ SOLN
INTRAMUSCULAR | Status: AC
  Filled 2021-12-03: qty 30

## 2021-12-03 MED ORDER — VERAPAMIL HCL 2.5 MG/ML IV SOLN
INTRAVENOUS | Status: AC
  Filled 2021-12-03: qty 2

## 2021-12-03 MED ORDER — LIDOCAINE HCL (PF) 1 % IJ SOLN
INTRAMUSCULAR | Status: DC | PRN
  Administered 2021-12-03: 2 mL

## 2021-12-03 MED ORDER — SODIUM CHLORIDE 0.9% FLUSH
3.0000 mL | Freq: Two times a day (BID) | INTRAVENOUS | Status: DC
Start: 2021-12-03 — End: 2021-12-03

## 2021-12-03 MED ORDER — SODIUM CHLORIDE 0.9 % IV SOLN: 250.0000 mL | INTRAVENOUS | Status: DC | PRN

## 2021-12-03 MED ORDER — MIDAZOLAM HCL 2 MG/2ML IJ SOLN
INTRAMUSCULAR | Status: DC | PRN
  Administered 2021-12-03: 2 mg via INTRAVENOUS

## 2021-12-03 MED ORDER — ONDANSETRON HCL 4 MG/2ML IJ SOLN: 4.0000 mg | Freq: Four times a day (QID) | INTRAMUSCULAR | Status: DC | PRN

## 2021-12-03 MED ORDER — ASPIRIN 81 MG PO CHEW
81.0000 mg | CHEWABLE_TABLET | ORAL | Status: AC
  Administered 2021-12-03: 81 mg via ORAL

## 2021-12-03 MED ORDER — VERAPAMIL HCL 2.5 MG/ML IV SOLN
INTRAVENOUS | Status: DC | PRN
  Administered 2021-12-03 (×2): 10 mL via INTRA_ARTERIAL

## 2021-12-03 MED ORDER — ASPIRIN 81 MG PO CHEW: 81.0000 mg | CHEWABLE_TABLET | ORAL | Status: DC

## 2021-12-03 MED ORDER — HYDRALAZINE HCL 20 MG/ML IJ SOLN: 10.0000 mg | INTRAMUSCULAR | Status: DC | PRN

## 2021-12-03 MED ORDER — HEPARIN (PORCINE) IN NACL 1000-0.9 UT/500ML-% IV SOLN
INTRAVENOUS | Status: AC
  Filled 2021-12-03: qty 1000

## 2021-12-03 MED ORDER — SODIUM CHLORIDE 0.9% FLUSH: 3.0000 mL | INTRAVENOUS | Status: DC | PRN

## 2021-12-03 MED ORDER — HEPARIN (PORCINE) IN NACL 1000-0.9 UT/500ML-% IV SOLN
INTRAVENOUS | Status: DC | PRN
  Administered 2021-12-03 (×2): 500 mL

## 2021-12-03 MED ORDER — FENTANYL CITRATE (PF) 100 MCG/2ML IJ SOLN
INTRAMUSCULAR | Status: AC
  Filled 2021-12-03: qty 2

## 2021-12-03 SURGICAL SUPPLY — 12 items
BAND CMPR LRG ZPHR (HEMOSTASIS) ×1
BAND ZEPHYR COMPRESS 30 LONG (HEMOSTASIS) ×1 IMPLANT
CATH 5FR JL3.5 JR4 ANG PIG MP (CATHETERS) ×1 IMPLANT
GLIDESHEATH SLEND SS 6F .021 (SHEATH) ×1 IMPLANT
GUIDEWIRE INQWIRE 1.5J.035X260 (WIRE) IMPLANT
INQWIRE 1.5J .035X260CM (WIRE) ×2
KIT HEART LEFT (KITS) ×2 IMPLANT
PACK CARDIAC CATHETERIZATION (CUSTOM PROCEDURE TRAY) ×2 IMPLANT
SHEATH PROBE COVER 6X72 (BAG) ×1 IMPLANT
TRANSDUCER W/STOPCOCK (MISCELLANEOUS) ×2 IMPLANT
TUBING CIL FLEX 10 FLL-RA (TUBING) ×2 IMPLANT
WIRE HI TORQ VERSACORE-J 145CM (WIRE) ×1 IMPLANT

## 2021-12-03 NOTE — Interval H&P Note (Signed)
Cath Lab Visit (complete for each Cath Lab visit)  Clinical Evaluation Leading to the Procedure:   ACS: No.  Non-ACS:    Anginal Classification: CCS III  Anti-ischemic medical therapy: Minimal Therapy (1 class of medications)  Non-Invasive Test Results: Low-risk stress test findings: cardiac mortality <1%/year  Prior CABG: No previous CABG   Negative CTA but cath in the face of persistent sx   History and Physical Interval Note:  12/03/2021 9:36 AM  Lacey [name]  has presented today for surgery, with the diagnosis of abnormal stress test.  The various methods of treatment have been discussed with the patient and family. After consideration of risks, benefits and other options for treatment, the patient has consented to  Procedure(s): LEFT HEART CATH AND CORONARY ANGIOGRAPHY (N/A) as a surgical intervention.  The patient's history has been reviewed, patient examined, no change in status, stable for surgery.  I have reviewed the patient's chart and labs.  Questions were answered to the patient's satisfaction.     Larae Grooms

## 2021-12-04 ENCOUNTER — Ambulatory Visit: Payer: Medicare PPO

## 2021-12-04 MED FILL — Verapamil HCl IV Soln 2.5 MG/ML: INTRAVENOUS | Qty: 2 | Status: AC

## 2021-12-11 ENCOUNTER — Encounter: Payer: Self-pay | Admitting: Neurology

## 2021-12-15 ENCOUNTER — Ambulatory Visit: Payer: Medicare PPO | Admitting: Internal Medicine

## 2021-12-15 ENCOUNTER — Encounter: Payer: Self-pay | Admitting: Internal Medicine

## 2021-12-15 ENCOUNTER — Telehealth: Payer: Self-pay | Admitting: Family Medicine

## 2021-12-15 VITALS — BP 122/72 | HR 69 | Ht 62.0 in | Wt 229.4 lb

## 2021-12-15 DIAGNOSIS — I5032 Chronic diastolic (congestive) heart failure: Secondary | ICD-10-CM

## 2021-12-15 DIAGNOSIS — I471 Supraventricular tachycardia: Secondary | ICD-10-CM | POA: Diagnosis not present

## 2021-12-15 DIAGNOSIS — I951 Orthostatic hypotension: Secondary | ICD-10-CM | POA: Diagnosis not present

## 2021-12-15 DIAGNOSIS — I4719 Other supraventricular tachycardia: Secondary | ICD-10-CM

## 2021-12-15 NOTE — Patient Instructions (Signed)

## 2021-12-15 NOTE — Telephone Encounter (Signed)
Caller name: Zamyah Wiesman   On DPR? :yes/no: Yes  Call back number: [phone]  Provider they see: Birdie Riddle   Reason for call: Pt states that her pharmacy told, she need PA for this medication.

## 2021-12-15 NOTE — Progress Notes (Signed)
Electrophysiology Office Note   Date:  12/15/2021   ID:  [name], DOB 12-07-1950, MRN [id number]  Location: patient's home  Provider location: [address], [place]  Evaluation Performed: Follow-up visit  PCP:  Midge Minium, MD  Cardiologist:   Electrophysiologist:  SK  Is already Chief Complaint:     History of Present Illness:    Lacey [name] is a 71 y.o. female who presents for follow-up for atrial tachyycardia for which she has been on verapamil and HFpEF and tremors for which she is taking propranolol.  Has had problems with bradycardia   Event recorder 5/23 demonstrated an average heart rate of 62 and minimum 45.  Heart rate excursion somewhat attenuated is in the 80s and 90s.  Personally reviewed    GXT 7/23 heart rate   maximum 127  Palpitations are relatively quiescient.  Dyspnea on exertion not very active.  Still smoking but only 5 cigarettes every 3 or 4 days.  DATE TEST EF   12/11 Echo  55-60%   6/16 Myoview   63 % No ischemia  3/23 CTA  Ca Score 401 No obstructive CAD  8/23 LHC 55-60% No obstructive CAD     Date Cr K HgbA1c  5/18  0.91 4.6 7.4   8/19 1.0 3.8 7.3  9/20 0.97 4.9 6.7  12/20 0.96 4.1   7/22 1.11 4.2 13.1  7/23 0.95 4.2 12.7          Past Medical History:  Diagnosis Date   Adenomatous colon polyp    hyperplastic   Anemia    Anginal pain (HCC)    not recent   Arthritis    NECK, KNEES, FINGERS, TOES   Atrial tachycardia ([facility]) CARDIOLOGIST - DR Caryl Comes (LAST VISIT AUG 2012)   Echo 12/11: EF 55-60%, Mild LVH, grade 1 diast dysfxn;   holter 1/12: ATach   Atypical chest pain    a. 07/2004 Cath: Clean cors;  b. 04/2010 Myoview: EF 63%, no ischemia   Blood transfusion without reported diagnosis 1969   Chronic kidney disease    left kidney small    Diabetes mellitus, type 2 (HCC)    ORAL AND INSULIN MEDS (LAST A1C  7.3)   Diverticulosis    Erythema CURRENT-- CLOSED ABD. WALL ABSCESS   PER PCP NOTE  (03-31-11)- MRSA-- TAKES DOXYCYCLINE   GERD (gastroesophageal reflux disease)    CONTROLLED W/ PREVACID   History of kidney stones 2012   Hyperlipidemia    Hypertension    Insomnia    TAKES MEDS PRN   Neuropathy, peripheral    Obesity (BMI 30-39.9) 02/19/2015   Pneumonia    as child   Post op infection 11/07/12   left bunionectomy   Restless leg syndrome    Sepsis, urinary HISTORY - 2004   Sleep apnea    no cpap    Thyroid disease    Tremor    Vitamin D deficiency     Past Surgical History:  Procedure Laterality Date   ABDOMINAL HYSTERECTOMY  1995   ovaries remain   BUNIONECTOMY Left 10/24/2012   BUNIONECTOMY Right 05/17/2017   CARDIAC CATHETERIZATION  07/10/2004   CARPAL TUNNEL RELEASE  2000   RIGHT   CATARACT EXTRACTION, BILATERAL     CERVICAL FUSION  10/12/2007   C5 - 7   COLONOSCOPY     CYSTOSCOPY WITH RETROGRADE PYELOGRAM, URETEROSCOPY AND STENT PLACEMENT Left 11/28/2012   Procedure: LEFT RETROGRADE PYELOGRAM, LEFT URETEROSCOPY, ;  Surgeon: Claybon Jabs, MD;  Location: [facility];  Service: Urology;  Laterality: Left;   CYSTOSCOPY/RETROGRADE/URETEROSCOPY/STONE EXTRACTION WITH BASKET  X2 2004 & 2009   LEFT    GASTRIC BYPASS  1981   KNEE ARTHROSCOPY  12/2010   LEFT   LAPAROSCOPIC CHOLECYSTECTOMY  2001   LEFT HEART CATH AND CORONARY ANGIOGRAPHY N/A 12/03/2021   Procedure: LEFT HEART CATH AND CORONARY ANGIOGRAPHY;  Surgeon: Jettie Booze, MD;  Location: [facility];  Service: Cardiovascular;  Laterality: N/A;   left thumb joint surgery  2013   PERCUTANEOUS NEPHROSTOLITHOTOMY  02/27/2011   LEFT   POLYPECTOMY     RIGHT THUMB JOINT SURG.  09/2009   TRIGGER FINGER RELEASE  2010   RIGHT THUMB   UPPER GASTROINTESTINAL ENDOSCOPY     URETERAL STENT PLACEMENT  X2  2004  &  2009   LEFT   URETEROSCOPY  04/06/2011   Procedure: URETEROSCOPY;  Surgeon: Claybon Jabs, MD;  Location: [facility];  Service: Urology;  Laterality: Left;  L Ureteroscopy  Laser Litho & Stent     Current Outpatient Medications  Medication Sig Dispense Refill   albuterol (VENTOLIN HFA) 108 (90 Base) MCG/ACT inhaler Inhale 2 puffs into the lungs every 6 (six) hours as needed for wheezing or shortness of breath. 8 g 6   Alcohol Swabs (DROPSAFE ALCOHOL PREP) 70 % PADS USE AS DIRECTED  AS NEEDED. 200 each 3   aspirin EC 81 MG tablet Take 81 mg by mouth daily.     atorvastatin (LIPITOR) 20 MG tablet TAKE 1 TABLET BY MOUTH EVERY DAY 90 tablet 1   Blood Glucose Monitoring Suppl (TRUE METRIX METER) w/Device KIT USE AS DIRECTED 1 kit 1   cholecalciferol (VITAMIN D) 25 MCG (1000 UNIT) tablet Take 1,000 Units by mouth daily.     clonazePAM (KLONOPIN) 1 MG tablet TAKE 1 TABLET AT BEDTIME FOR RESTLESS LEGS SYNDROME 90 tablet 1   diclofenac Sodium (VOLTAREN) 1 % GEL APPLY 2 GRAMS TO AFFECTED AREA 4 TIMES A DAY (Patient taking differently: Apply 1 Application topically 4 (four) times daily as needed (pain).) 300 g 1   fenofibrate 160 MG tablet TAKE 1 TABLET EVERY DAY 90 tablet 3   Fluticasone-Umeclidin-Vilant (TRELEGY ELLIPTA) 100-62.5-25 MCG/ACT AEPB Inhale 1 puff into the lungs daily. 28 each 11   furosemide (LASIX) 20 MG tablet Take by mouth.     gabapentin (NEURONTIN) 300 MG capsule Take 3 capsules (900 mg total) by mouth 3 (three) times daily. 270 capsule 11   glucose blood (TRUE METRIX BLOOD GLUCOSE TEST) test strip Use as instructed to test blood sugar 4 times daily. Dx. E11.9 400 each 3   insulin aspart (NOVOLOG FLEXPEN) 100 UNIT/ML FlexPen INJECT 25 UNITS SUBCUTANEOUSLY THREE TIMES DAILY WITH MEALS (Patient taking differently: Inject 25-27 Units into the skin 3 (three) times daily with meals.) 75 mL 0   Insulin Pen Needle (DROPLET PEN NEEDLES) 31G X 8 MM MISC USE 4 TIMES DAILY 400 each 6   Lancets Super Thin 28G MISC Please use as directed to test sugars 4 times daily. Dx. E11.9 420 each 3   lansoprazole (PREVACID) 15 MG capsule Take 15 mg by mouth daily.     LANTUS  SOLOSTAR 100 UNIT/ML Solostar Pen INJECT 60 UNITS UNDER THE SKIN AT BEDTIME 60 mL 0   levothyroxine (SYNTHROID) 50 MCG tablet TAKE 1 TABLET BY MOUTH EVERY DAY BEFORE BREAKFAST 90 tablet 1   loperamide (IMODIUM A-D) 2  MG tablet Take 2 mg by mouth 4 (four) times daily as needed for diarrhea or loose stools.     MYRBETRIQ 50 MG TB24 tablet Take 50 mg by mouth daily.     neomycin-bacitracin-polymyxin [phone] OINT Apply 1 Application topically daily.     primidone (MYSOLINE) 50 MG tablet Take 1 tablet (50 mg total) by mouth 2 (two) times daily. 180 tablet 1   traZODone (DESYREL) 100 MG tablet TAKE 1 TABLET AT BEDTIME 90 tablet 3   valsartan (DIOVAN) 320 MG tablet TAKE 1 TABLET EVERY DAY 90 tablet 2   verapamil (CALAN-SR) 120 MG CR tablet TAKE 1 TABLET BY MOUTH AT BEDTIME. 90 tablet 1   No current facility-administered medications for this visit.    Allergies:   Patient has no known allergies.    Review Of Systems negative except from HPI and PMH   Exam:    Vital Signs:    BP 122/72   Pulse 69   Ht _0  (1.575 m)   Wt 229 lb 6.4 oz (104.1 kg)   LMP 06/06/1993   SpO2 92%   BMI 41.96 kg/m  Well developed and Morbidly obese in no acute distress HENT normal Neck supple with JVP-flat Clear Regular rate and rhythm, no  murmur Abd-soft with active BS No Clubbing cyanosis \ edema Skin-warm and dry A & Oriented  Grossly normal sensory and motor function  ECG sinus at 69 15/09/38 Nonspecific ST changes T wave flattening     Labs/Other Tests and Data Reviewed:    Recent Labs: 06/20/2021: ALT 7; TSH 2.73 11/27/2021: BUN 15; Creatinine, Ser 0.95; Hemoglobin 12.7; Platelets 179; Potassium 4.2; Sodium 142   Wt Readings from Last 3 Encounters:  12/15/21 229 lb 6.4 oz (104.1 kg)  12/03/21 230 lb (104.3 kg)  11/27/21 232 lb 9.6 oz (105.5 kg)     Other studies personally reviewed: Additional studies/ records that were reviewed today include    ASSESSMENT & PLAN:    Atrial  tachycardia      HFpEF  Aortic atherosclerosis   Hypertension   Morbidly obese   Smoking  No role tachy palpitations.  Continue verapamil at 120 and the Diovan at 320 daily.  The combination is also controlling her blood pressure.  Some dyspnea.  Following with pulmonary.  Does not really see benefit from the inhalers.  Encouraged her to walk.  Regular basis.  Also, she is almost on smoking, told her to call us when she was 5 days off.  Continue statins for coronary atherosclerosis   Current medicines are reviewed at length with the patient today.   The patient does not have concerns regarding her medicines.  T   Labs/ tests ordered today include:  CBC and ECG    [address] [place] [address] [phone] (office) [phone] (fax)

## 2021-12-16 MED ORDER — GABAPENTIN 300 MG PO CAPS: 900.0000 mg | ORAL_CAPSULE | Freq: Three times a day (TID) | ORAL | 11 refills | Status: DC

## 2021-12-16 NOTE — Telephone Encounter (Signed)
Prescription refilled for pt

## 2021-12-24 ENCOUNTER — Telehealth: Payer: Self-pay

## 2021-12-24 NOTE — Telephone Encounter (Signed)
Biopsy results just received.  There was no pathologic evidence of alpha-synuclein.  No evidence of small fiber neuropathy.  No evidence of amyloid neuropathy.  You can let her know that her biopsy results were normal.  We will continue to monitor her given the abnormal DaTscan, but currently she does not meet criteria for Parkinson's disease.  Make sure she has follow up in future (not urgent)

## 2021-12-24 NOTE — Telephone Encounter (Signed)
Called Patient and informed her of results per Dr. Carles Collet. She was happy, she also reported that the medicine you put her on is working very well.

## 2021-12-24 NOTE — Telephone Encounter (Signed)
Dr. Carles Collet these results are back just wanted to verify I sent them to you. Would you like me to call patient ?

## 2021-12-25 DIAGNOSIS — R8271 Bacteriuria: Secondary | ICD-10-CM | POA: Diagnosis not present

## 2021-12-25 DIAGNOSIS — N3941 Urge incontinence: Secondary | ICD-10-CM | POA: Diagnosis not present

## 2021-12-25 DIAGNOSIS — N3020 Other chronic cystitis without hematuria: Secondary | ICD-10-CM | POA: Diagnosis not present

## 2021-12-26 ENCOUNTER — Ambulatory Visit: Payer: Medicare PPO | Admitting: Internal Medicine

## 2021-12-26 ENCOUNTER — Ambulatory Visit: Payer: Medicare PPO | Admitting: Interventional Cardiology

## 2021-12-26 ENCOUNTER — Other Ambulatory Visit: Payer: Self-pay | Admitting: Family Medicine

## 2021-12-29 ENCOUNTER — Encounter: Payer: Self-pay | Admitting: Pulmonary Disease

## 2021-12-29 ENCOUNTER — Ambulatory Visit: Payer: Medicare PPO | Admitting: Pulmonary Disease

## 2021-12-29 ENCOUNTER — Ambulatory Visit: Payer: Medicare PPO | Admitting: Internal Medicine

## 2021-12-29 VITALS — BP 148/64 | HR 60 | Temp 97.8°F | Ht 62.0 in | Wt 228.0 lb

## 2021-12-29 DIAGNOSIS — F17210 Nicotine dependence, cigarettes, uncomplicated: Secondary | ICD-10-CM | POA: Diagnosis not present

## 2021-12-29 DIAGNOSIS — J449 Chronic obstructive pulmonary disease, unspecified: Secondary | ICD-10-CM | POA: Diagnosis not present

## 2021-12-29 MED ORDER — IPRATROPIUM-ALBUTEROL 0.5-2.5 (3) MG/3ML IN SOLN: 3.0000 mL | RESPIRATORY_TRACT | 2 refills | Status: DC | PRN

## 2021-12-29 NOTE — Progress Notes (Unsigned)
Synopsis: Referred in April 2023 for shortness of breath  Subjective:   PATIENT ID: Lacey Guile GENDER: female DOB: 1950-07-28, MRN: [id number]  HPI  No chief complaint on file.  Lacey [name] is a 71 year old woman, daily smoker with DM II, CKD, GERD, hypertension, sleep apnea not on CPAP and obesity who returns to pulmonary clinic for shortness of breath.   She was trialed on trelegy ellipta 1 puff daily at last visit. PFTs today show   LHC 12/03/21 showed mild non-obstructive coronary artery disease and EF 55-65%. She saw EP on 12/15/21 with recommendations to continue on verapamil 146m daily.     Initial OV 08/19/21 She reports having shortness of breath over the past few months. She notices it when she is climbing into the bed. She has intermittent cough and some wheezing. She has some heartburn. She has not tried any inhalers for her breathing before. She has seasonal allergies that lead to sneezing and watery eyes.  She is smoking 1/4 to 1/3 pack per day. She has smoked for 50 years, previously 1 pack per day. She did not tolerate wellbutrin in the past for smoking cessation due to hallucinations.   Her sister had lung cancer. Her mother and father had lung issues from working in a [employer]   Past Medical History:  Diagnosis Date   Adenomatous colon polyp    hyperplastic   Anemia    Anginal pain ([place]    not recent   Arthritis    NECK, KNEES, FINGERS, TOES   Atrial tachycardia ([facility] CARDIOLOGIST - DR KCaryl Comes(LAST VISIT AUG 2012)   Echo 12/11: EF 55-60%, Mild LVH, grade 1 diast dysfxn;   holter 1/12: ATach   Atypical chest pain    a. 07/2004 Cath: Clean cors;  b. 04/2010 Myoview: EF 63%, no ischemia   Blood transfusion without reported diagnosis 1969   Chronic kidney disease    left kidney small    Diabetes mellitus, type 2 (HCC)    ORAL AND INSULIN MEDS (LAST A1C  7.3)   Diverticulosis    Erythema CURRENT-- CLOSED ABD. WALL ABSCESS   PER PCP NOTE (03-31-11)- MRSA--  TAKES DOXYCYCLINE   GERD (gastroesophageal reflux disease)    CONTROLLED W/ PREVACID   History of kidney stones 2012   Hyperlipidemia    Hypertension    Insomnia    TAKES MEDS PRN   Neuropathy, peripheral    Obesity (BMI 30-39.9) 02/19/2015   Pneumonia    as child   Post op infection 11/07/12   left bunionectomy   Restless leg syndrome    Sepsis, urinary HISTORY - 2004   Sleep apnea    no cpap    Thyroid disease    Tremor    Vitamin D deficiency      Family History  Problem Relation Age of Onset   Hyperlipidemia Mother    Hypertension Mother    Heart attack Father    Lung disease Father    Heart disease Sister    Hypertension Sister    Colon polyps Sister    Hypertension Sister    Colon polyps Sister    Hypertension Sister    Hypertension Sister    Lung cancer Sister 534      stage 4    Diabetes Other    Breast cancer Other    Heart disease Other    Colon cancer Other 497      nephew   Esophageal cancer Neg  Hx    Rectal cancer Neg Hx    Stomach cancer Neg Hx    Amblyopia Neg Hx    Blindness Neg Hx    Cataracts Neg Hx    Glaucoma Neg Hx    Retinal detachment Neg Hx    Strabismus Neg Hx    Retinitis pigmentosa Neg Hx      Social History   Socioeconomic History   Marital status: Married    Spouse name: Not on file   Number of children: 2   Years of education: 12   Highest education level: High school graduate  Occupational History   Occupation: Engineer, materials middle school    Employer: [employer]    Comment: in office   Occupation: Retired  Tobacco Use   Smoking status: Former    Packs/day: 0.25    Years: 43.00    Total pack years: 10.75    Types: Cigarettes    Quit date: 10/16/2021    Years since quitting: 0.2   Smokeless tobacco: Never   Tobacco comments:    0.75ppd x62yr, 0.25ppd x170yr3/10/17). Counseled to quit -smoking, nicotine replacement and smoking cessation classes discussed-3-4 a day as of 09-06-15  Vaping Use   Vaping Use: Never  used  Substance and Sexual Activity   Alcohol use: No    Alcohol/week: 0.0 standard drinks of alcohol   Drug use: No   Sexual activity: Not on file  Other Topics Concern   Not on file  Social History Narrative   2 children, 2 stepchildren   Lives with husband.   Right-handed.   No daily caffeine.   Social Determinants of Health   Financial Resource Strain: Low Risk  (11/25/2020)   Overall Financial Resource Strain (CARDIA)    Difficulty of Paying Living Expenses: Not hard at all  Food Insecurity: No Food Insecurity (11/25/2020)   Hunger Vital Sign    Worried About Running Out of Food in the Last Year: Never true    Ran Out of Food in the Last Year: Never true  Transportation Needs: No Transportation Needs (11/25/2020)   PRAPARE - TrHydrologistMedical): No    Lack of Transportation (Non-Medical): No  Physical Activity: Insufficiently Active (11/25/2020)   Exercise Vital Sign    Days of Exercise per Week: 3 days    Minutes of Exercise per Session: 30 min  Stress: No Stress Concern Present (11/25/2020)   [place]  Feeling of Stress : Not at all  Social Connections: [place]7/25/2022)   Social Connection and Isolation Panel [NHANES]    Frequency of Communication with Friends and Family: Twice a week    Frequency of Social Gatherings with Friends and Family: Three times a week    Attends Religious Services: More than 4 times per year    Active Member of Clubs or Organizations: No    Attends ClArchivisteetings: 1 to 4 times per year    Marital Status: Married  InHuman resources officeriolence: Not At Risk (11/25/2020)   Humiliation, Afraid, Rape, and Kick questionnaire    Fear of Current or Ex-Partner: No    Emotionally Abused: No    Physically Abused: No    Sexually Abused: No     No Known Allergies   Outpatient Medications Prior to Visit  Medication Sig  Dispense Refill   albuterol (VENTOLIN HFA) 108 (90 Base) MCG/ACT inhaler Inhale 2 puffs into  the lungs every 6 (six) hours as needed for wheezing or shortness of breath. 8 [name] 6   Alcohol Swabs (DROPSAFE ALCOHOL PREP) 70 % PADS USE AS DIRECTED  AS NEEDED. 200 each 3   aspirin EC 81 MG tablet Take 81 mg by mouth daily.     atorvastatin (LIPITOR) 20 MG tablet TAKE 1 TABLET BY MOUTH EVERY DAY 90 tablet 1   Blood Glucose Monitoring Suppl (TRUE METRIX METER) w/Device KIT USE AS DIRECTED 1 kit 1   cholecalciferol (VITAMIN D) 25 MCG (1000 UNIT) tablet Take 1,000 Units by mouth daily.     clonazePAM (KLONOPIN) 1 MG tablet TAKE 1 TABLET AT BEDTIME FOR RESTLESS LEGS SYNDROME 90 tablet 1   diclofenac Sodium (VOLTAREN) 1 % GEL APPLY 2 GRAMS TO AFFECTED AREA 4 TIMES A DAY (Patient taking differently: Apply 1 Application topically 4 (four) times daily as needed (pain).) 300 [name] 1   fenofibrate 160 MG tablet TAKE 1 TABLET EVERY DAY 90 tablet 3   Fluticasone-Umeclidin-Vilant (TRELEGY ELLIPTA) 100-62.5-25 MCG/ACT AEPB Inhale 1 puff into the lungs daily. 28 each 11   furosemide (LASIX) 20 MG tablet Take by mouth.     gabapentin (NEURONTIN) 300 MG capsule Take 3 capsules (900 mg total) by mouth 3 (three) times daily. 270 capsule 11   glucose blood (TRUE METRIX BLOOD GLUCOSE TEST) test strip Use as instructed to test blood sugar 4 times daily. Dx. E11.9 400 each 3   Insulin Pen Needle (DROPLET PEN NEEDLES) 31G X 8 MM MISC USE 4 TIMES DAILY 400 each 6   Lancets Super Thin 28G MISC Please use as directed to test sugars 4 times daily. Dx. E11.9 420 each 3   lansoprazole (PREVACID) 15 MG capsule Take 15 mg by mouth daily.     LANTUS SOLOSTAR 100 UNIT/ML Solostar Pen INJECT 60 UNITS UNDER THE SKIN AT BEDTIME 60 mL 0   levothyroxine (SYNTHROID) 50 MCG tablet TAKE 1 TABLET BY MOUTH EVERY DAY BEFORE BREAKFAST 90 tablet 1   loperamide (IMODIUM A-D) 2 MG tablet Take 2 mg by mouth 4 (four) times daily as needed for diarrhea or  loose stools.     MYRBETRIQ 50 MG TB24 tablet Take 50 mg by mouth daily.     neomycin-bacitracin-polymyxin [phone] OINT Apply 1 Application topically daily.     NOVOLOG FLEXPEN 100 UNIT/ML FlexPen INJECT 25 UNITS SUBCUTANEOUSLY THREE TIMES DAILY WITH MEALS 75 mL 0   primidone (MYSOLINE) 50 MG tablet Take 1 tablet (50 mg total) by mouth 2 (two) times daily. 180 tablet 1   traZODone (DESYREL) 100 MG tablet TAKE 1 TABLET AT BEDTIME 90 tablet 3   valsartan (DIOVAN) 320 MG tablet TAKE 1 TABLET EVERY DAY 90 tablet 2   verapamil (CALAN-SR) 120 MG CR tablet TAKE 1 TABLET BY MOUTH AT BEDTIME. 90 tablet 1   No facility-administered medications prior to visit.    Review of Systems  Constitutional:  Negative for chills, fever, malaise/fatigue and weight loss.  HENT:  Negative for congestion, sinus pain and sore throat.   Eyes: Negative.   Respiratory:  Positive for cough, shortness of breath and wheezing. Negative for hemoptysis and sputum production.   Cardiovascular:  Negative for chest pain, palpitations, orthopnea, claudication and leg swelling.  Gastrointestinal:  Negative for abdominal pain, heartburn, nausea and vomiting.  Genitourinary: Negative.   Musculoskeletal:  Negative for joint pain and myalgias.  Skin:  Negative for rash.  Neurological:  Negative for weakness.  Endo/Heme/Allergies:  Positive for  environmental allergies.  Psychiatric/Behavioral: Negative.       Objective:   There were no vitals filed for this visit.   Physical Exam Constitutional:      General: She is not in acute distress.    Appearance: She is obese. She is not ill-appearing.  HENT:     Head: Normocephalic and atraumatic.  Eyes:     General: No scleral icterus.    Conjunctiva/sclera: Conjunctivae normal.     Pupils: Pupils are equal, round, and reactive to light.  Cardiovascular:     Rate and Rhythm: Regular rhythm. Bradycardia present.     Pulses: Normal pulses.     Heart sounds: Normal heart  sounds. No murmur heard. Pulmonary:     Effort: Pulmonary effort is normal.     Breath sounds: Normal breath sounds. No wheezing, rhonchi or rales.  Abdominal:     General: Bowel sounds are normal.     Palpations: Abdomen is soft.  Musculoskeletal:     Right lower leg: No edema.     Left lower leg: No edema.  Lymphadenopathy:     Cervical: No cervical adenopathy.  Skin:    General: Skin is warm and dry.  Neurological:     General: No focal deficit present.     Mental Status: She is alert.  Psychiatric:        Mood and Affect: Mood normal.        Behavior: Behavior normal.        Thought Content: Thought content normal.        Judgment: Judgment normal.    CBC    Component Value Date/Time   WBC 6.9 11/27/2021 1107   WBC 10.3 06/20/2021 1114   RBC 4.09 11/27/2021 1107   RBC 4.71 06/20/2021 1114   HGB 12.7 11/27/2021 1107   HCT 38.7 11/27/2021 1107   PLT 179 11/27/2021 1107   MCV 95 11/27/2021 1107   MCH 31.1 11/27/2021 1107   MCH 29.5 07/08/2016 1735   MCHC 32.8 11/27/2021 1107   MCHC 33.0 06/20/2021 1114   RDW 12.6 11/27/2021 1107   LYMPHSABS 3.1 06/20/2021 1114   MONOABS 0.7 06/20/2021 1114   EOSABS 0.2 06/20/2021 1114   BASOSABS 0.1 06/20/2021 1114      Latest Ref Rng & Units 11/27/2021   11:07 AM 09/24/2021   10:51 AM 07/03/2021    4:37 PM  BMP  Glucose 70 - 99 mg/dL 130  143  52   BUN 8 - 27 mg/dL '15  22  14   ' Creatinine 0.57 - 1.00 mg/dL 0.95  1.07  1.03   BUN/Creat Ratio 12 - '28 16   14   ' Sodium 134 - 144 mmol/L 142  139  142   Potassium 3.5 - 5.2 mmol/L 4.2  4.5 Hemolysis seen..  3.7   Chloride 96 - 106 mmol/L 104  103  104   CO2 20 - 29 mmol/L '24  28  23   ' Calcium 8.7 - 10.3 mg/dL 9.2  9.1  9.2    Chest imaging: [facility] CT Chest 10/16/20 Mediastinum/Nodes: No mediastinal or definite hilar adenopathy, given limitations of unenhanced CT.   Lungs/Pleura: No pleural fluid. Mild centrilobular emphysema. Bilateral calcified and noncalcified pulmonary nodules  are similar. The largest noncalcified nodule measures volume derived equivalent diameter 3.4 mm.  PFT:     No data to display          Labs:  Path:  Echo 05/06/21: LV EF 60-65%. Mild LVH.  Grade II diastolic dysfunction. RV systolic function and size are normal.   Heart Catheterization:  Assessment & Plan:   No diagnosis found.  Discussion: Lacey [name] is a 71 year old woman, daily smoker with DM II, CKD, GERD, hypertension, sleep apnea not on CPAP and obesity who is referred to pulmonary clinic for shortness of breath.   Her shortness of breath may be related to bradycardia due to multiple rate control agents as well as underlying obstructive lung disease given her history of smoking.   She is to speak with her other providers in regards to the propranolol and verapamil.   She is to start trelegy ellipta 1 puff daily and use albuterol inhaler as needed.   Recommend smoking cessation using 76m nicotine patches per day and as needed nicotine lozenges or gum.   Follow up in 4 months with pulmonary function tests.  JFreda Jackson MD LL[facility]rerasPulmonary & Critical Care Office: [phone]  Current Outpatient Medications:    albuterol (VENTOLIN HFA) 108 (90 Base) MCG/ACT inhaler, Inhale 2 puffs into the lungs every 6 (six) hours as needed for wheezing or shortness of breath., Disp: 8 [name], Rfl: 6   Alcohol Swabs (DROPSAFE ALCOHOL PREP) 70 % PADS, USE AS DIRECTED  AS NEEDED., Disp: 200 each, Rfl: 3   aspirin EC 81 MG tablet, Take 81 mg by mouth daily., Disp: , Rfl:    atorvastatin (LIPITOR) 20 MG tablet, TAKE 1 TABLET BY MOUTH EVERY DAY, Disp: 90 tablet, Rfl: 1   Blood Glucose Monitoring Suppl (TRUE METRIX METER) w/Device KIT, USE AS DIRECTED, Disp: 1 kit, Rfl: 1   cholecalciferol (VITAMIN D) 25 MCG (1000 UNIT) tablet, Take 1,000 Units by mouth daily., Disp: , Rfl:    clonazePAM (KLONOPIN) 1 MG tablet, TAKE 1 TABLET AT BEDTIME FOR RESTLESS LEGS SYNDROME, Disp: 90 tablet, Rfl:  1   diclofenac Sodium (VOLTAREN) 1 % GEL, APPLY 2 GRAMS TO AFFECTED AREA 4 TIMES A DAY (Patient taking differently: Apply 1 Application topically 4 (four) times daily as needed (pain).), Disp: 300 [name], Rfl: 1   fenofibrate 160 MG tablet, TAKE 1 TABLET EVERY DAY, Disp: 90 tablet, Rfl: 3   Fluticasone-Umeclidin-Vilant (TRELEGY ELLIPTA) 100-62.5-25 MCG/ACT AEPB, Inhale 1 puff into the lungs daily., Disp: 28 each, Rfl: 11   furosemide (LASIX) 20 MG tablet, Take by mouth., Disp: , Rfl:    gabapentin (NEURONTIN) 300 MG capsule, Take 3 capsules (900 mg total) by mouth 3 (three) times daily., Disp: 270 capsule, Rfl: 11   glucose blood (TRUE METRIX BLOOD GLUCOSE TEST) test strip, Use as instructed to test blood sugar 4 times daily. Dx. E11.9, Disp: 400 each, Rfl: 3   Insulin Pen Needle (DROPLET PEN NEEDLES) 31G X 8 MM MISC, USE 4 TIMES DAILY, Disp: 400 each, Rfl: 6   Lancets Super Thin 28G MISC, Please use as directed to test sugars 4 times daily. Dx. E11.9, Disp: 420 each, Rfl: 3   lansoprazole (PREVACID) 15 MG capsule, Take 15 mg by mouth daily., Disp: , Rfl:    LANTUS SOLOSTAR 100 UNIT/ML Solostar Pen, INJECT 60 UNITS UNDER THE SKIN AT BEDTIME, Disp: 60 mL, Rfl: 0   levothyroxine (SYNTHROID) 50 MCG tablet, TAKE 1 TABLET BY MOUTH EVERY DAY BEFORE BREAKFAST, Disp: 90 tablet, Rfl: 1   loperamide (IMODIUM A-D) 2 MG tablet, Take 2 mg by mouth 4 (four) times daily as needed for diarrhea or loose stools., Disp: , Rfl:    MYRBETRIQ 50 MG TB24 tablet, Take 50  mg by mouth daily., Disp: , Rfl:    neomycin-bacitracin-polymyxin [phone] OINT, Apply 1 Application topically daily., Disp: , Rfl:    NOVOLOG FLEXPEN 100 UNIT/ML FlexPen, INJECT 25 UNITS SUBCUTANEOUSLY THREE TIMES DAILY WITH MEALS, Disp: 75 mL, Rfl: 0   primidone (MYSOLINE) 50 MG tablet, Take 1 tablet (50 mg total) by mouth 2 (two) times daily., Disp: 180 tablet, Rfl: 1   traZODone (DESYREL) 100 MG tablet, TAKE 1 TABLET AT BEDTIME, Disp: 90 tablet, Rfl:  3   valsartan (DIOVAN) 320 MG tablet, TAKE 1 TABLET EVERY DAY, Disp: 90 tablet, Rfl: 2   verapamil (CALAN-SR) 120 MG CR tablet, TAKE 1 TABLET BY MOUTH AT BEDTIME., Disp: 90 tablet, Rfl: 1

## 2021-12-29 NOTE — Patient Instructions (Addendum)
Recommend smoking cessation using '7mg'$  nicotine patches per day and as needed nicotine lozenges or gum.   We will give you smoking cessation card today for further support  Continue to use trelegy 1 puff daily - rinse mouth out after each use  We will order you a nebulizer machine and duoneb nebulizer treatments.   Use duoneb treatments morning and evening time followed by flutter valve for mucous clearance.   We will schedule you for pulmonary function tests at the earliest possible time  Follow up in 6 months

## 2021-12-30 ENCOUNTER — Encounter: Payer: Self-pay | Admitting: Pulmonary Disease

## 2021-12-30 DIAGNOSIS — E119 Type 2 diabetes mellitus without complications: Secondary | ICD-10-CM | POA: Diagnosis not present

## 2021-12-30 LAB — HM DIABETES EYE EXAM

## 2022-01-01 ENCOUNTER — Ambulatory Visit (INDEPENDENT_AMBULATORY_CARE_PROVIDER_SITE_OTHER): Payer: Medicare PPO | Admitting: Family Medicine

## 2022-01-01 ENCOUNTER — Encounter: Payer: Self-pay | Admitting: Family Medicine

## 2022-01-01 ENCOUNTER — Telehealth: Payer: Self-pay

## 2022-01-01 VITALS — BP 130/68 | HR 65 | Temp 97.1°F | Resp 16 | Ht 61.0 in | Wt 229.0 lb

## 2022-01-01 DIAGNOSIS — E559 Vitamin D deficiency, unspecified: Secondary | ICD-10-CM | POA: Diagnosis not present

## 2022-01-01 DIAGNOSIS — Z0000 Encounter for general adult medical examination without abnormal findings: Secondary | ICD-10-CM

## 2022-01-01 DIAGNOSIS — Z794 Long term (current) use of insulin: Secondary | ICD-10-CM | POA: Diagnosis not present

## 2022-01-01 DIAGNOSIS — E113391 Type 2 diabetes mellitus with moderate nonproliferative diabetic retinopathy without macular edema, right eye: Secondary | ICD-10-CM | POA: Diagnosis not present

## 2022-01-01 LAB — LIPID PANEL
Cholesterol: 147 mg/dL (ref 0–200)
HDL: 47.2 mg/dL (ref >39.00)
LDL Cholesterol: 77 mg/dL (ref 0–99)
NonHDL: 99.62
Total CHOL/HDL Ratio: 3
Triglycerides: 112 mg/dL (ref 0.0–149.0)
VLDL: 22.4 mg/dL (ref 0.0–40.0)

## 2022-01-01 LAB — CBC WITH DIFFERENTIAL/PLATELET
Basophils Absolute: 0.1 10*3/uL (ref 0.0–0.1)
Basophils Relative: 1 % (ref 0.0–3.0)
Eosinophils Absolute: 0.1 10*3/uL (ref 0.0–0.7)
Eosinophils Relative: 1.6 % (ref 0.0–5.0)
HCT: 39.6 % (ref 36.0–46.0)
Hemoglobin: 13.1 g/dL (ref 12.0–15.0)
Lymphocytes Relative: 22.6 % (ref 12.0–46.0)
Lymphs Abs: 1.7 10*3/uL (ref 0.7–4.0)
MCHC: 33.2 g/dL (ref 30.0–36.0)
MCV: 94.6 fl (ref 78.0–100.0)
Monocytes Absolute: 0.5 10*3/uL (ref 0.1–1.0)
Monocytes Relative: 7 % (ref 3.0–12.0)
Neutro Abs: 5.1 10*3/uL (ref 1.4–7.7)
Neutrophils Relative %: 67.8 % (ref 43.0–77.0)
Platelets: 157 10*3/uL (ref 150.0–400.0)
RBC: 4.18 Mil/uL (ref 3.87–5.11)
RDW: 13.8 % (ref 11.5–15.5)
WBC: 7.5 10*3/uL (ref 4.0–10.5)

## 2022-01-01 LAB — VITAMIN D 25 HYDROXY (VIT D DEFICIENCY, FRACTURES): VITD: 36.67 ng/mL (ref 30.00–100.00)

## 2022-01-01 LAB — BASIC METABOLIC PANEL
BUN: 19 mg/dL (ref 6–23)
CO2: 29 mEq/L (ref 19–32)
Calcium: 9.3 mg/dL (ref 8.4–10.5)
Chloride: 103 mEq/L (ref 96–112)
Creatinine, Ser: 1.07 mg/dL (ref 0.40–1.20)
GFR: 52.21 mL/min — ABNORMAL LOW (ref >60.00)
Glucose, Bld: 148 mg/dL — ABNORMAL HIGH (ref 70–99)
Potassium: 4.5 mEq/L (ref 3.5–5.1)
Sodium: 139 mEq/L (ref 135–145)

## 2022-01-01 LAB — HEPATIC FUNCTION PANEL
ALT: 16 U/L (ref 0–35)
AST: 21 U/L (ref 0–37)
Albumin: 4.2 g/dL (ref 3.5–5.2)
Alkaline Phosphatase: 56 U/L (ref 39–117)
Bilirubin, Direct: 0.1 mg/dL (ref 0.0–0.3)
Total Bilirubin: 0.3 mg/dL (ref 0.2–1.2)
Total Protein: 7 g/dL (ref 6.0–8.3)

## 2022-01-01 LAB — HEMOGLOBIN A1C: Hgb A1c MFr Bld: 7.3 % — ABNORMAL HIGH (ref 4.6–6.5)

## 2022-01-01 LAB — TSH: TSH: 2.39 u[IU]/mL (ref 0.35–5.50)

## 2022-01-01 NOTE — Assessment & Plan Note (Signed)
Pt's PE unchanged from previous and WNL w/ exception of obesity and known tremor.  UTD on mammo, DEXA, colonoscopy, Tdap.  Will wait for high dose flu shot.  Check labs.  Anticipatory guidance provided.

## 2022-01-01 NOTE — Assessment & Plan Note (Signed)
Chronic problem.  Overall, pt is doing very well.  UTD on eye exam, microalbumin.  Foot exam done today.  Check labs.  Adjust meds prn

## 2022-01-01 NOTE — Telephone Encounter (Signed)
Informed pt of lab results  

## 2022-01-01 NOTE — Assessment & Plan Note (Signed)
Check labs and replete prn. 

## 2022-01-01 NOTE — Progress Notes (Signed)
Subjective:    Patient ID: Lacey [name], female    DOB: 05-21-1950, 71 y.o.   MRN: [id number]  HPI CPE- UTD on mammo, eye exam, foot exam, DEXA, colonoscopy, Tdap  Patient Care Team    Relationship Specialty Notifications Start End  Midge Minium, MD PCP - General Family Medicine  07/15/21   Jettie Booze, MD PCP - Cardiology Cardiology  07/03/21   Deboraha Sprang, MD PCP - Electrophysiology Cardiology  07/03/21   Deboraha Sprang, MD Consulting Physician Cardiology  08/03/14   Almedia Balls, MD Consulting Physician Orthopedic Surgery  08/03/14   Dyke Maes, [place] Consulting Physician Optometry  05/01/15   Irene Shipper, MD Consulting Physician Gastroenterology  05/02/15   Ceasar Mons, MD Consulting Physician Urology  06/09/17   [place], Amy, MD Consulting Physician Dermatology  06/09/17   Marchia Bond, MD Consulting Physician Orthopedic Surgery  06/16/18   Tat, Eustace Quail, DO Consulting Physician Neurology  10/21/21      Health Maintenance  Topic Date Due   INFLUENZA VACCINE  08/02/2022 (Originally 12/02/2021)   MAMMOGRAM  01/07/2022   HEMOGLOBIN A1C  03/27/2022   OPHTHALMOLOGY EXAM  04/11/2022   FOOT EXAM  01/02/2023   DEXA SCAN  01/01/2025   COLONOSCOPY (Pts 45-36yr Insurance coverage will need to be confirmed)  09/03/2025   TETANUS/TDAP  01/24/2029   Pneumonia Vaccine 71 Years old  Completed   Hepatitis C Screening  Completed   Zoster Vaccines- Shingrix  Completed   HPV VACCINES  Aged Out   COVID-19 Vaccine  Discontinued      Review of Systems Patient reports no vision/ hearing changes, adenopathy,fever, weight change,  persistant/recurrent hoarseness , swallowing issues, chest pain, palpitations, edema, persistant/recurrent cough, hemoptysis, dyspnea (rest/exertional/paroxysmal nocturnal), gastrointestinal bleeding (melena, rectal bleeding), abdominal pain, significant heartburn, bowel changes, GU symptoms (dysuria, hematuria, incontinence), Gyn symptoms  (abnormal  bleeding, pain),  syncope, focal weakness, memory loss, numbness & tingling, skin/hair/nail changes, abnormal bruising or bleeding, anxiety, or depression.     Objective:   Physical Exam General Appearance:    Alert, cooperative, no distress, appears stated age  Head:    Normocephalic, without obvious abnormality, atraumatic  Eyes:    PERRL, conjunctiva/corneas clear, EOM's intact both eyes  Ears:    Normal TM's and external ear canals, both ears  Nose:   Nares normal, septum midline, mucosa normal, no drainage    or sinus tenderness  Throat:   Lips, mucosa, and tongue normal; teeth and gums normal  Neck:   Supple, symmetrical, trachea midline, no adenopathy;    Thyroid: no enlargement/tenderness/nodules  Back:     Symmetric, no curvature, ROM normal, no CVA tenderness  Lungs:     Clear to auscultation bilaterally, respirations unlabored  Chest Wall:    No tenderness or deformity   Heart:    Regular rate and rhythm, S1 and S2 normal, no murmur, rub   or gallop  Breast Exam:    Deferred to mammo  Abdomen:     Soft, non-tender, bowel sounds active all four quadrants,    no masses, no organomegaly  Genitalia:    Deferred to GYN  Rectal:    Extremities:   Extremities normal, atraumatic, no cyanosis or edema  Pulses:   2+ and symmetric all extremities  Skin:   Skin color, texture, turgor normal, no rashes or lesions  Lymph nodes:   Cervical, supraclavicular, and axillary nodes normal  Neurologic:   CNII-XII intact, normal strength, sensation  and reflexes    throughout          Assessment & Plan:

## 2022-01-01 NOTE — Telephone Encounter (Signed)
-----   Message from Midge Minium, MD sent at 01/01/2022  1:06 PM EDT ----- Labs look great!  No changes at this time

## 2022-01-01 NOTE — Patient Instructions (Addendum)
Follow up in 3-4 months to recheck sugar We'll notify you of your lab results and make any changes if needed Continue to work on low carb/low sugar diet- you can do it!!! Call with any questions or concerns Stay Safe!  Stay Healthy! Happy Labor Day!!!

## 2022-01-06 ENCOUNTER — Encounter: Payer: Self-pay | Admitting: Family Medicine

## 2022-01-08 ENCOUNTER — Ambulatory Visit (INDEPENDENT_AMBULATORY_CARE_PROVIDER_SITE_OTHER): Payer: Medicare PPO

## 2022-01-08 VITALS — Wt 229.0 lb

## 2022-01-08 DIAGNOSIS — Z0000 Encounter for general adult medical examination without abnormal findings: Secondary | ICD-10-CM

## 2022-01-08 NOTE — Patient Instructions (Signed)
Lacey [name] , Thank you for taking time to come for your Medicare Wellness Visit. I appreciate your ongoing commitment to your health goals. Please review the following plan we discussed and let me know if I can assist you in the future.   Screening recommendations/referrals: Colonoscopy: 09/03/20 Mammogram: 01/07/21, has appt 9/12   Bone Density: 01/02/20, has appt 9/12 Recommended yearly ophthalmology/optometry visit for glaucoma screening and checkup Recommended yearly dental visit for hygiene and checkup  Vaccinations: Influenza vaccine: 01/06/22 Pneumococcal vaccine: 02/06/16 Tdap vaccine: 01/25/19 Shingles vaccine: Zostavax 03/24/12   Shingrix 07/01/17, 09/04/17   Covid-19:05/29/19, 06/19/19, 01/29/20, 01/08/21  Advanced directives: no  Conditions/risks identified: none  Next appointment: Follow up in one year for your annual wellness visit 01/14/23 @ 10:45 am by phone   Preventive Care 65 Years and Older, Female Preventive care refers to lifestyle choices and visits with your health care provider that can promote health and wellness. What does preventive care include? A yearly physical exam. This is also called an annual well check. Dental exams once or twice a year. Routine eye exams. Ask your health care provider how often you should have your eyes checked. Personal lifestyle choices, including: Daily care of your teeth and gums. Regular physical activity. Eating a healthy diet. Avoiding tobacco and drug use. Limiting alcohol use. Practicing safe sex. Taking low-dose aspirin every day. Taking vitamin and mineral supplements as recommended by your health care provider. What happens during an annual well check? The services and screenings done by your health care provider during your annual well check will depend on your age, overall health, lifestyle risk factors, and family history of disease. Counseling  Your health care provider may ask you questions about your: Alcohol  use. Tobacco use. Drug use. Emotional well-being. Home and relationship well-being. Sexual activity. Eating habits. History of falls. Memory and ability to understand (cognition). Work and work Statistician. Reproductive health. Screening  You may have the following tests or measurements: Height, weight, and BMI. Blood pressure. Lipid and cholesterol levels. These may be checked every 5 years, or more frequently if you are over 5 years old. Skin check. Lung cancer screening. You may have this screening every year starting at age 49 if you have a 30-pack-year history of smoking and currently smoke or have quit within the past 15 years. Fecal occult blood test (FOBT) of the stool. You may have this test every year starting at age 88. Flexible sigmoidoscopy or colonoscopy. You may have a sigmoidoscopy every 5 years or a colonoscopy every 10 years starting at age 62. Hepatitis C blood test. Hepatitis B blood test. Sexually transmitted disease (STD) testing. Diabetes screening. This is done by checking your blood sugar (glucose) after you have not eaten for a while (fasting). You may have this done every 1-3 years. Bone density scan. This is done to screen for osteoporosis. You may have this done starting at age 67. Mammogram. This may be done every 1-2 years. Talk to your health care provider about how often you should have regular mammograms. Talk with your health care provider about your test results, treatment options, and if necessary, the need for more tests. Vaccines  Your health care provider may recommend certain vaccines, such as: Influenza vaccine. This is recommended every year. Tetanus, diphtheria, and acellular pertussis (Tdap, Td) vaccine. You may need a Td booster every 10 years. Zoster vaccine. You may need this after age 57. Pneumococcal 13-valent conjugate (PCV13) vaccine. One dose is recommended after age 36. Pneumococcal  polysaccharide (PPSV23) vaccine. One dose is  recommended after age 50. Talk to your health care provider about which screenings and vaccines you need and how often you need them. This information is not intended to replace advice given to you by your health care provider. Make sure you discuss any questions you have with your health care provider. Document Released: 05/17/2015 Document Revised: 01/08/2016 Document Reviewed: 02/19/2015 Elsevier Interactive Patient Education  2017 [place] Prevention in the Home Falls can cause injuries. They can happen to people of all ages. There are many things you can do to make your home safe and to help prevent falls. What can I do on the outside of my home? Regularly fix the edges of walkways and driveways and fix any cracks. Remove anything that might make you trip as you walk through a door, such as a raised step or threshold. Trim any bushes or trees on the path to your home. Use bright outdoor lighting. Clear any walking paths of anything that might make someone trip, such as rocks or tools. Regularly check to see if handrails are loose or broken. Make sure that both sides of any steps have handrails. Any raised decks and porches should have guardrails on the edges. Have any leaves, snow, or ice cleared regularly. Use sand or salt on walking paths during winter. Clean up any spills in your garage right away. This includes oil or grease spills. What can I do in the bathroom? Use night lights. Install grab bars by the toilet and in the tub and shower. Do not use towel bars as grab bars. Use non-skid mats or decals in the tub or shower. If you need to sit down in the shower, use a plastic, non-slip stool. Keep the floor dry. Clean up any water that spills on the floor as soon as it happens. Remove soap buildup in the tub or shower regularly. Attach bath mats securely with double-sided non-slip rug tape. Do not have throw rugs and other things on the floor that can make you  trip. What can I do in the bedroom? Use night lights. Make sure that you have a light by your bed that is easy to reach. Do not use any sheets or blankets that are too big for your bed. They should not hang down onto the floor. Have a firm chair that has side arms. You can use this for support while you get dressed. Do not have throw rugs and other things on the floor that can make you trip. What can I do in the kitchen? Clean up any spills right away. Avoid walking on wet floors. Keep items that you use a lot in easy-to-reach places. If you need to reach something above you, use a strong step stool that has a grab bar. Keep electrical cords out of the way. Do not use floor polish or wax that makes floors slippery. If you must use wax, use non-skid floor wax. Do not have throw rugs and other things on the floor that can make you trip. What can I do with my stairs? Do not leave any items on the stairs. Make sure that there are handrails on both sides of the stairs and use them. Fix handrails that are broken or loose. Make sure that handrails are as long as the stairways. Check any carpeting to make sure that it is firmly attached to the stairs. Fix any carpet that is loose or worn. Avoid having throw rugs at the top  or bottom of the stairs. If you do have throw rugs, attach them to the floor with carpet tape. Make sure that you have a light switch at the top of the stairs and the bottom of the stairs. If you do not have them, ask someone to add them for you. What else can I do to help prevent falls? Wear shoes that: Do not have high heels. Have rubber bottoms. Are comfortable and fit you well. Are closed at the toe. Do not wear sandals. If you use a stepladder: Make sure that it is fully opened. Do not climb a closed stepladder. Make sure that both sides of the stepladder are locked into place. Ask someone to hold it for you, if possible. Clearly mark and make sure that you can  see: Any grab bars or handrails. First and last steps. Where the edge of each step is. Use tools that help you move around (mobility aids) if they are needed. These include: Canes. Walkers. Scooters. Crutches. Turn on the lights when you go into a dark area. Replace any light bulbs as soon as they burn out. Set up your furniture so you have a clear path. Avoid moving your furniture around. If any of your floors are uneven, fix them. If there are any pets around you, be aware of where they are. Review your medicines with your doctor. Some medicines can make you feel dizzy. This can increase your chance of falling. Ask your doctor what other things that you can do to help prevent falls. This information is not intended to replace advice given to you by your health care provider. Make sure you discuss any questions you have with your health care provider. Document Released: 02/14/2009 Document Revised: 09/26/2015 Document Reviewed: 05/25/2014 Elsevier Interactive Patient Education  2017 [employer].

## 2022-01-08 NOTE — Progress Notes (Signed)
Virtual Visit via Telephone Note  I connected with  Lacey [name] on 01/08/22 at 10:30 AM EDT by telephone and verified that I am speaking with the correct person using two identifiers.  Location: Patient: home Provider: [facility] Persons participating in the virtual visit: patient/Nurse Health Advisor   I discussed the limitations, risks, security and privacy concerns of performing an evaluation and management service by telephone and the availability of in person appointments. The patient expressed understanding and agreed to proceed.  Interactive audio and video telecommunications were attempted between this nurse and patient, however failed, due to patient having technical difficulties OR patient did not have access to video capability.  We continued and completed visit with audio only.  Some vital signs may be absent or patient reported.   Dionisio David, LPN  Subjective:   Lacey [name] is a 71 y.o. female who presents for Medicare Annual (Subsequent) preventive examination.  Review of Systems     Cardiac Risk Factors include: advanced age (>62mn, >>88women);diabetes mellitus;hypertension;dyslipidemia     Objective:    There were no vitals filed for this visit. There is no height or weight on file to calculate BMI.     01/08/2022   10:35 AM 12/03/2021    8:24 AM 10/21/2021   11:04 AM 04/17/2021    9:54 AM 03/24/2021   12:51 PM 11/25/2020   11:02 AM 11/13/2019    9:15 AM  Advanced Directives  Does Patient Have a Medical Advance Directive? No Yes Yes Yes Yes No Yes  Type of Advance Directive  Living will;Healthcare Power of Attorney Living will Living will Living will  [place]  Copy of [place] Chart?  No - copy requested     No - copy requested  Would patient like information on creating a medical advance directive? No - Patient declined     No - Patient declined     Current Medications (verified) Outpatient Encounter  Medications as of 01/08/2022  Medication Sig   albuterol (VENTOLIN HFA) 108 (90 Base) MCG/ACT inhaler Inhale 2 puffs into the lungs every 6 (six) hours as needed for wheezing or shortness of breath.   Alcohol Swabs (DROPSAFE ALCOHOL PREP) 70 % PADS USE AS DIRECTED  AS NEEDED.   aspirin EC 81 MG tablet Take 81 mg by mouth daily.   atorvastatin (LIPITOR) 20 MG tablet TAKE 1 TABLET BY MOUTH EVERY DAY   Blood Glucose Monitoring Suppl (TRUE METRIX METER) w/Device KIT USE AS DIRECTED   cholecalciferol (VITAMIN D) 25 MCG (1000 UNIT) tablet Take 1,000 Units by mouth daily.   clonazePAM (KLONOPIN) 1 MG tablet TAKE 1 TABLET AT BEDTIME FOR RESTLESS LEGS SYNDROME   diclofenac Sodium (VOLTAREN) 1 % GEL APPLY 2 GRAMS TO AFFECTED AREA 4 TIMES A DAY (Patient taking differently: Apply 1 Application topically 4 (four) times daily as needed (pain).)   fenofibrate 160 MG tablet TAKE 1 TABLET EVERY DAY   Fluticasone-Umeclidin-Vilant (TRELEGY ELLIPTA) 100-62.5-25 MCG/ACT AEPB Inhale 1 puff into the lungs daily.   furosemide (LASIX) 20 MG tablet Take by mouth.   gabapentin (NEURONTIN) 300 MG capsule Take 3 capsules (900 mg total) by mouth 3 (three) times daily.   glucose blood (TRUE METRIX BLOOD GLUCOSE TEST) test strip Use as instructed to test blood sugar 4 times daily. Dx. E11.9   Insulin Pen Needle (DROPLET PEN NEEDLES) 31G X 8 MM MISC USE 4 TIMES DAILY   ipratropium-albuterol (DUONEB) 0.5-2.5 (3) MG/3ML SOLN  Take 3 mLs by nebulization every 4 (four) hours as needed.   Lancets Super Thin 28G MISC Please use as directed to test sugars 4 times daily. Dx. E11.9   lansoprazole (PREVACID) 15 MG capsule Take 15 mg by mouth daily.   LANTUS SOLOSTAR 100 UNIT/ML Solostar Pen INJECT 60 UNITS UNDER THE SKIN AT BEDTIME   levothyroxine (SYNTHROID) 50 MCG tablet TAKE 1 TABLET BY MOUTH EVERY DAY BEFORE BREAKFAST   loperamide (IMODIUM A-D) 2 MG tablet Take 2 mg by mouth 4 (four) times daily as needed for diarrhea or loose stools.    neomycin-bacitracin-polymyxin [phone] OINT Apply 1 Application topically daily.   NOVOLOG FLEXPEN 100 UNIT/ML FlexPen INJECT 25 UNITS SUBCUTANEOUSLY THREE TIMES DAILY WITH MEALS   primidone (MYSOLINE) 50 MG tablet Take 1 tablet (50 mg total) by mouth 2 (two) times daily.   traZODone (DESYREL) 100 MG tablet TAKE 1 TABLET AT BEDTIME   valsartan (DIOVAN) 320 MG tablet TAKE 1 TABLET EVERY DAY   verapamil (CALAN-SR) 120 MG CR tablet TAKE 1 TABLET BY MOUTH AT BEDTIME.   Vibegron (GEMTESA) 75 MG TABS Take 1 tablet by mouth daily.   nitrofurantoin, macrocrystal-monohydrate, (MACROBID) 100 MG capsule Take 1 capsule by mouth in the morning and at bedtime. (Patient not taking: Reported on 01/08/2022)   No facility-administered encounter medications on file as of 01/08/2022.    Allergies (verified) Patient has no known allergies.   History: Past Medical History:  Diagnosis Date   Adenomatous colon polyp    hyperplastic   Anemia    Anginal pain ([place])    not recent   Arthritis    NECK, KNEES, FINGERS, TOES   Atrial tachycardia ([place]) CARDIOLOGIST - DR Caryl Comes (LAST VISIT AUG 2012)   Echo 12/11: EF 55-60%, Mild LVH, grade 1 diast dysfxn;   holter 1/12: ATach   Atypical chest pain    a. 07/2004 Cath: Clean cors;  b. 04/2010 Myoview: EF 63%, no ischemia   Blood transfusion without reported diagnosis 1969   Chronic kidney disease    left kidney small    Diabetes mellitus, type 2 (HCC)    ORAL AND INSULIN MEDS (LAST A1C  7.3)   Diverticulosis    Erythema CURRENT-- CLOSED ABD. WALL ABSCESS   PER PCP NOTE (03-31-11)- MRSA-- TAKES DOXYCYCLINE   GERD (gastroesophageal reflux disease)    CONTROLLED W/ PREVACID   History of kidney stones 2012   Hyperlipidemia    Hypertension    Insomnia    TAKES MEDS PRN   Neuropathy, peripheral    Obesity (BMI 30-39.9) 02/19/2015   Pneumonia    as child   Post op infection 11/07/12   left bunionectomy   Restless leg syndrome    Sepsis, urinary HISTORY - 2004    Sleep apnea    no cpap    Thyroid disease    Tremor    Vitamin D deficiency    Past Surgical History:  Procedure Laterality Date   ABDOMINAL HYSTERECTOMY  1995   ovaries remain   BUNIONECTOMY Left 10/24/2012   BUNIONECTOMY Right 05/17/2017   CARDIAC CATHETERIZATION  07/10/2004   CARPAL TUNNEL RELEASE  2000   RIGHT   CATARACT EXTRACTION, BILATERAL     CERVICAL FUSION  10/12/2007   C5 - 7   COLONOSCOPY     CYSTOSCOPY WITH RETROGRADE PYELOGRAM, URETEROSCOPY AND STENT PLACEMENT Left 11/28/2012   Procedure: LEFT RETROGRADE PYELOGRAM, LEFT URETEROSCOPY, ;  Surgeon: Claybon Jabs, MD;  Location: [facility];  Service:  Urology;  Laterality: Left;   CYSTOSCOPY/RETROGRADE/URETEROSCOPY/STONE EXTRACTION WITH BASKET  X2 2004 & 2009   LEFT    GASTRIC BYPASS  1981   KNEE ARTHROSCOPY  12/2010   LEFT   LAPAROSCOPIC CHOLECYSTECTOMY  2001   LEFT HEART CATH AND CORONARY ANGIOGRAPHY N/A 12/03/2021   Procedure: LEFT HEART CATH AND CORONARY ANGIOGRAPHY;  Surgeon: Jettie Booze, MD;  Location: [facility];  Service: Cardiovascular;  Laterality: N/A;   left thumb joint surgery  2013   PERCUTANEOUS NEPHROSTOLITHOTOMY  02/27/2011   LEFT   POLYPECTOMY     RIGHT THUMB JOINT SURG.  09/2009   TRIGGER FINGER RELEASE  2010   RIGHT THUMB   UPPER GASTROINTESTINAL ENDOSCOPY     URETERAL STENT PLACEMENT  X2  2004  &  2009   LEFT   URETEROSCOPY  04/06/2011   Procedure: URETEROSCOPY;  Surgeon: Claybon Jabs, MD;  Location: [facility];  Service: Urology;  Laterality: Left;  L Ureteroscopy Laser Litho & Stent    Family History  Problem Relation Age of Onset   Hyperlipidemia Mother    Hypertension Mother    Heart attack Father    Lung disease Father    Heart disease Sister    Hypertension Sister    Colon polyps Sister    Hypertension Sister    Colon polyps Sister    Hypertension Sister    Hypertension Sister    Lung cancer Sister 47       stage 4    Diabetes Other     Breast cancer Other    Heart disease Other    Colon cancer Other 62       nephew   Esophageal cancer Neg Hx    Rectal cancer Neg Hx    Stomach cancer Neg Hx    Amblyopia Neg Hx    Blindness Neg Hx    Cataracts Neg Hx    Glaucoma Neg Hx    Retinal detachment Neg Hx    Strabismus Neg Hx    Retinitis pigmentosa Neg Hx    Social History   Socioeconomic History   Marital status: Married    Spouse name: Not on file   Number of children: 2   Years of education: 12   Highest education level: High school graduate  Occupational History   Occupation: Engineer, materials middle school    Employer: [employer]    Comment: in office   Occupation: Retired  Tobacco Use   Smoking status: Every Day    Packs/day: 2.00    Years: 43.00    Total pack years: 86.00    Types: Cigarettes   Smokeless tobacco: Never   Tobacco comments:    1/2 ppd 12/29/21  Vaping Use   Vaping Use: Never used  Substance and Sexual Activity   Alcohol use: No    Alcohol/week: 0.0 standard drinks of alcohol   Drug use: No   Sexual activity: Not on file  Other Topics Concern   Not on file  Social History Narrative   2 children, 2 stepchildren   Lives with husband.   Right-handed.   No daily caffeine.   Social Determinants of Health   Financial Resource Strain: Low Risk  (01/08/2022)   Overall Financial Resource Strain (CARDIA)    Difficulty of Paying Living Expenses: Not hard at all  Food Insecurity: No Food Insecurity (01/08/2022)   Hunger Vital Sign    Worried About Running Out of Food in the Last  Year: Never true    [place] in the Last Year: Never true  Transportation Needs: No Transportation Needs (01/08/2022)   PRAPARE - Hydrologist (Medical): No    Lack of Transportation (Non-Medical): No  Physical Activity: Insufficiently Active (01/08/2022)   Exercise Vital Sign    Days of Exercise per Week: 3 days    Minutes of Exercise per Session: 30 min  Stress: No Stress Concern  Present (01/08/2022)   [place]    Feeling of Stress : Not at all  Social Connections: Moderately Integrated (01/08/2022)   Social Connection and Isolation Panel [NHANES]    Frequency of Communication with Friends and Family: Three times a week    Frequency of Social Gatherings with Friends and Family: Once a week    Attends Religious Services: More than 4 times per year    Active Member of [employer] or Organizations: No    Attends Music therapist: Never    Marital Status: Married    Tobacco Counseling Ready to quit: Not Answered Counseling given: Not Answered Tobacco comments: 1/2 ppd 12/29/21   Clinical Intake:  Pre-visit preparation completed: Yes  Pain : No/denies pain     Nutritional Risks: None Diabetes: Yes CBG done?: No Did pt. bring in CBG monitor from home?: No  How often do you need to have someone help you when you read instructions, pamphlets, or other written materials from your doctor or pharmacy?: 1 - Never  Diabetic?yes Nutrition Risk Assessment:  Has the patient had any N/V/D within the last 2 months?  No  Does the patient have any non-healing wounds?  No  Has the patient had any unintentional weight loss or weight gain?  No   Diabetes:  Is the patient diabetic?  Yes  If diabetic, was a CBG obtained today?  No  Did the patient bring in their glucometer from home?  No  How often do you monitor your CBG's? Four times/day.   Financial Strains and Diabetes Management:  Are you having any financial strains with the device, your supplies or your medication? No .  Does the patient want to be seen by Chronic Care Management for management of their diabetes?  No  Would the patient like to be referred to a Nutritionist or for Diabetic Management?  No   Diabetic Exams:  Diabetic Eye Exam: Completed 12/30/21. Pt has been advised about the importance in completing this exam.   Diabetic Foot Exam: Completed 01/01/22. Pt has been advised about the importance in completing this exam.   Interpreter Needed?: No  Information entered by :: Kirke Shaggy, LPN   Activities of Daily Living    01/08/2022   10:37 AM 01/08/2022    8:44 AM  In your present state of health, do you have any difficulty performing the following activities:  Hearing? 0 0  Vision? 0 0  Difficulty concentrating or making decisions? 0 0  Walking or climbing stairs? 0 0  Dressing or bathing? 0 0  Doing errands, shopping? 0 0  Preparing Food and eating ? N N  Using the Toilet? N N  In the past six months, have you accidently leaked urine? Y Y  Do you have problems with loss of bowel control? N N  Managing your Medications? N N  Managing your Finances? N N  Housekeeping or managing your Housekeeping? N N    Patient Care Team: Annye Asa  E, MD as PCP - General (Family Medicine) Jettie Booze, MD as PCP - Cardiology (Cardiology) Deboraha Sprang, MD as PCP - Electrophysiology (Cardiology) Deboraha Sprang, MD as Consulting Physician (Cardiology) Almedia Balls, MD as Consulting Physician (Orthopedic Surgery) Dyke Maes, [place] as Consulting Physician (Optometry) Irene Shipper, MD as Consulting Physician (Gastroenterology) Ceasar Mons, MD as Consulting Physician (Urology) Martinique, Amy, MD as Consulting Physician (Dermatology) Marchia Bond, MD as Consulting Physician (Orthopedic Surgery) Tat, Eustace Quail, DO as Consulting Physician (Neurology)  Indicate any recent Medical Services you may have received from other than [facility] providers in the past year (date may be approximate).     Assessment:   This is a routine wellness examination for Lacey [name].  Hearing/Vision screen Hearing Screening - Comments:: No aids Vision Screening - Comments:: Readers- Dr.Wood  Dietary issues and exercise activities discussed: Current Exercise Habits: Home exercise routine, Type of  exercise: walking, Time (Minutes): 30, Frequency (Times/Week): 3, Weekly Exercise (Minutes/Week): 90, Intensity: Mild   Goals Addressed             This Visit's Progress    DIET - EAT MORE FRUITS AND VEGETABLES         Depression Screen    01/08/2022   10:33 AM 01/01/2022   10:11 AM 09/24/2021   10:15 AM 06/20/2021   10:30 AM 03/17/2021   11:09 AM 03/03/2021   10:22 AM 11/25/2020   10:58 AM  PHQ 2/9 Scores  PHQ - 2 Score 0 0  1 0 1 0  PHQ- 9 Score 0 0  _0 Exception Documentation   Patient refusal        Fall Risk    01/08/2022   10:36 AM 01/08/2022    8:44 AM 01/01/2022   10:12 AM 10/21/2021   11:03 AM 09/24/2021   10:15 AM  Fall Risk   Falls in the past year? 1 1 0 1 0  Number falls in past yr: _1 0  Injury with Fall? 0 0  0 0  Risk for fall due to : History of fall(s)  No Fall Risks  No Fall Risks  Follow up Falls prevention discussed;Falls evaluation completed  Falls evaluation completed  Falls evaluation completed    FALL RISK PREVENTION PERTAINING TO THE HOME:  Any stairs in or around the home? No  If so, are there any without handrails? No  Home free of loose throw rugs in walkways, pet beds, electrical cords, etc? Yes  Adequate lighting in your home to reduce risk of falls? Yes   ASSISTIVE DEVICES UTILIZED TO PREVENT FALLS:  Life alert? No  Use of a cane, walker or w/c? No  Grab bars in the bathroom? Yes  Shower chair or bench in shower? Yes  Elevated toilet seat or a handicapped toilet? No    Cognitive Function:    06/16/2018   10:28 AM 06/09/2017    1:30 PM  MMSE - Mini Mental State Exam  Orientation to time 5 5  Orientation to Place 5 5  Registration 3 3  Attention/ Calculation 5 5  Recall 3 3  Language- name 2 objects 2 2  Language- repeat 1 1  Language- follow 3 step command 3 3  Language- read & follow direction 1 1  Write a sentence 1 1  Copy design 1 1  Total score 30 30        01/08/2022   10:40 AM  6CIT Screen  What Year? 0  points  What month? 0 points  What time? 0 points  Count back from 20 0 points  Months in reverse 0 points  Repeat phrase 0 points  Total Score 0 points    Immunizations Immunization History  Administered Date(s) Administered   Influenza Split 02/05/2011, 02/26/2012   Influenza Whole 02/05/2009, 01/20/2010   Influenza, High Dose Seasonal PF 01/10/2018, 12/23/2018, 01/25/2019, 01/04/2020, 01/08/2021   Influenza,inj,Quad PF,6+ Mos 01/24/2013, 01/15/2014, 01/22/2015, 01/23/2016, 12/29/2016   Influenza-Unspecified 01/02/2021   PFIZER(Purple Top)SARS-COV-2 Vaccination 05/29/2019, 06/19/2019, 01/29/2020   Pfizer Covid-19 Vaccine Bivalent Booster 55yrs & up 01/08/2021   Pneumococcal Conjugate-13 04/17/2014   Pneumococcal Polysaccharide-23 06/05/2007, 02/06/2016   Tdap 06/22/2011, 12/23/2018, 01/25/2019   Zoster Recombinat (Shingrix) 07/01/2017, 09/04/2017   Zoster, Live 03/24/2012    TDAP status: Up to date  Flu Vaccine status: Up to date  Pneumococcal vaccine status: Up to date  Covid-19 vaccine status: Completed vaccines  Qualifies for Shingles Vaccine? Yes   Zostavax completed Yes   Shingrix Completed?: Yes  Screening Tests Health Maintenance  Topic Date Due   MAMMOGRAM  01/07/2022   HEMOGLOBIN A1C  07/02/2022   OPHTHALMOLOGY EXAM  12/31/2022   FOOT EXAM  01/02/2023   DEXA SCAN  01/01/2025   COLONOSCOPY (Pts 45-71yrs Insurance coverage will need to be confirmed)  09/03/2025   TETANUS/TDAP  01/24/2029   Pneumonia Vaccine 11+ Years old  Completed   INFLUENZA VACCINE  Completed   Hepatitis C Screening  Completed   Zoster Vaccines- Shingrix  Completed   HPV VACCINES  Aged Out   COVID-19 Vaccine  Discontinued    Health Maintenance  Health Maintenance Due  Topic Date Due   MAMMOGRAM  01/07/2022    Colorectal cancer screening: Type of screening: Colonoscopy. Completed 09/03/20. Repeat every 5 years  Mammogram status: Completed 01/07/21. Repeat every year- has appt  9/12  Bone Density status: Completed 01/02/20. Results reflect: Bone density results: OSTEOPENIA. Repeat every 5 years.- has appt 9/12  Lung Cancer Screening: (Low Dose CT Chest recommended if Age 48-80 years, 30 pack-year currently smoking OR have quit w/in 15years.) does not qualify.   Additional Screening:  Hepatitis C Screening: does qualify; Completed 02/06/16  Vision Screening: Recommended annual ophthalmology exams for early detection of glaucoma and other disorders of the eye. Is the patient up to date with their annual eye exam?  Yes  Who is the provider or what is the name of the office in which the patient attends annual eye exams? Dr.Wood If pt is not established with a provider, would they like to be referred to a provider to establish care? No .   Dental Screening: Recommended annual dental exams for proper oral hygiene  Community Resource Referral / Chronic Care Management: CRR required this visit?  No   CCM required this visit?  No      Plan:     I have personally reviewed and noted the following in the patient's chart:   Medical and social history Use of alcohol, tobacco or illicit drugs  Current medications and supplements including opioid prescriptions. Patient is not currently taking opioid prescriptions. Functional ability and status Nutritional status Physical activity Advanced directives List of other physicians Hospitalizations, surgeries, and ER visits in previous 12 months Vitals Screenings to include cognitive, depression, and falls Referrals and appointments  In addition, I have reviewed and discussed with patient certain preventive protocols, quality metrics, and best practice recommendations. A written personalized care plan for preventive services as  well as general preventive health recommendations were provided to patient.     Dionisio David, LPN   11/09/5581   Nurse Notes: none

## 2022-01-13 DIAGNOSIS — M85851 Other specified disorders of bone density and structure, right thigh: Secondary | ICD-10-CM | POA: Diagnosis not present

## 2022-01-13 DIAGNOSIS — Z780 Asymptomatic menopausal state: Secondary | ICD-10-CM | POA: Diagnosis not present

## 2022-01-13 DIAGNOSIS — Z1231 Encounter for screening mammogram for malignant neoplasm of breast: Secondary | ICD-10-CM | POA: Diagnosis not present

## 2022-01-13 DIAGNOSIS — M85852 Other specified disorders of bone density and structure, left thigh: Secondary | ICD-10-CM | POA: Diagnosis not present

## 2022-01-13 LAB — HM DEXA SCAN

## 2022-01-21 ENCOUNTER — Other Ambulatory Visit: Payer: Self-pay | Admitting: Family Medicine

## 2022-02-05 DIAGNOSIS — N3020 Other chronic cystitis without hematuria: Secondary | ICD-10-CM | POA: Diagnosis not present

## 2022-02-05 DIAGNOSIS — N3941 Urge incontinence: Secondary | ICD-10-CM | POA: Diagnosis not present

## 2022-02-13 ENCOUNTER — Ambulatory Visit: Payer: Medicare PPO

## 2022-02-18 ENCOUNTER — Other Ambulatory Visit: Payer: Self-pay | Admitting: Family Medicine

## 2022-03-09 DIAGNOSIS — B078 Other viral warts: Secondary | ICD-10-CM | POA: Diagnosis not present

## 2022-03-09 DIAGNOSIS — D224 Melanocytic nevi of scalp and neck: Secondary | ICD-10-CM | POA: Diagnosis not present

## 2022-03-09 DIAGNOSIS — L821 Other seborrheic keratosis: Secondary | ICD-10-CM | POA: Diagnosis not present

## 2022-03-09 DIAGNOSIS — D225 Melanocytic nevi of trunk: Secondary | ICD-10-CM | POA: Diagnosis not present

## 2022-03-09 DIAGNOSIS — L738 Other specified follicular disorders: Secondary | ICD-10-CM | POA: Diagnosis not present

## 2022-03-09 DIAGNOSIS — L570 Actinic keratosis: Secondary | ICD-10-CM | POA: Diagnosis not present

## 2022-03-09 DIAGNOSIS — L820 Inflamed seborrheic keratosis: Secondary | ICD-10-CM | POA: Diagnosis not present

## 2022-03-09 DIAGNOSIS — L814 Other melanin hyperpigmentation: Secondary | ICD-10-CM | POA: Diagnosis not present

## 2022-03-09 DIAGNOSIS — D485 Neoplasm of uncertain behavior of skin: Secondary | ICD-10-CM | POA: Diagnosis not present

## 2022-03-09 DIAGNOSIS — D692 Other nonthrombocytopenic purpura: Secondary | ICD-10-CM | POA: Diagnosis not present

## 2022-03-10 ENCOUNTER — Encounter: Payer: Self-pay | Admitting: Family Medicine

## 2022-03-17 ENCOUNTER — Other Ambulatory Visit: Payer: Self-pay | Admitting: Family Medicine

## 2022-03-17 DIAGNOSIS — G2581 Restless legs syndrome: Secondary | ICD-10-CM

## 2022-03-17 NOTE — Telephone Encounter (Signed)
Patient is requesting a refill of the following medications: Requested Prescriptions   Pending Prescriptions Disp Refills   fenofibrate 160 MG tablet [Pharmacy Med Name: FENOFIBRATE 160 MG Tablet] 90 tablet 10    Sig: TAKE 1 TABLET EVERY DAY   clonazePAM (KLONOPIN) 1 MG tablet [Pharmacy Med Name: CLONAZEPAM 1 MG Tablet] 30 tablet     Sig: TAKE 1 TABLET AT BEDTIME FOR RESTLESS LEGS SYNDROME    Date of patient request: 03/17/22 Last office visit: 09/24/21 Date of last refill: 01/01/22 Last refill amount: 90

## 2022-03-23 ENCOUNTER — Encounter: Payer: Self-pay | Admitting: Pulmonary Disease

## 2022-03-23 ENCOUNTER — Ambulatory Visit (INDEPENDENT_AMBULATORY_CARE_PROVIDER_SITE_OTHER): Payer: Medicare PPO | Admitting: Pulmonary Disease

## 2022-03-23 ENCOUNTER — Ambulatory Visit: Payer: Medicare PPO | Admitting: Pulmonary Disease

## 2022-03-23 VITALS — BP 128/72 | HR 74 | Ht 62.5 in | Wt 230.2 lb

## 2022-03-23 DIAGNOSIS — R0602 Shortness of breath: Secondary | ICD-10-CM

## 2022-03-23 DIAGNOSIS — J432 Centrilobular emphysema: Secondary | ICD-10-CM

## 2022-03-23 DIAGNOSIS — F17210 Nicotine dependence, cigarettes, uncomplicated: Secondary | ICD-10-CM

## 2022-03-23 NOTE — Progress Notes (Signed)
Synopsis: Referred in April 2023 for shortness of breath  Subjective:   PATIENT ID: Lacey [name] GENDER: female DOB: Oct 03, 1950, MRN: [id number]  HPI  Chief Complaint  Patient presents with   Follow-up    F/U after PFT. States she has been coughing more over the past few weeks. Productive cough with thick clear phlegm.    [name] is a 71 year old woman, daily smoker with DM II, CKD, GERD, hypertension, sleep apnea not on CPAP and obesity who returns to pulmonary clinic for shortness of breath.   PFTs today are within normal limits, RV is elevated. She is smoking 1/3 to 1/4 pack per day. She is trying nicotine gum.   She complains of urinary retention. She was started on duoneb nebulizer treatments along with trelegy ellipta 1 puff daily after last visit.   OV 12/29/21 She was trialed on trelegy ellipta 1 puff daily at last visit with improvement in her shortness of breath. She has not had PFTs yet. She is also using albuterol inhaler at least once daily. She complains of productive cough.  LHC 12/03/21 showed mild non-obstructive coronary artery disease and EF 55-65%. She saw EP on 12/15/21 with recommendations to continue on verapamil 181m daily. She has been taken off propranolol due to bradycardia.  She continues to smoke daily.  Initial OV 08/19/21 She reports having shortness of breath over the past few months. She notices it when she is climbing into the bed. She has intermittent cough and some wheezing. She has some heartburn. She has not tried any inhalers for her breathing before. She has seasonal allergies that lead to sneezing and watery eyes.  She is smoking 1/4 to 1/3 pack per day. She has smoked for 50 years, previously 1 pack per day. She did not tolerate wellbutrin in the past for smoking cessation due to hallucinations.   Her sister had lung cancer. Her mother and father had lung issues from working in a [employer]   Past Medical History:  Diagnosis Date    Adenomatous colon polyp    hyperplastic   Anemia    Anginal pain ([place]    not recent   Arthritis    NECK, KNEES, FINGERS, TOES   Atrial tachycardia CARDIOLOGIST - DR KCaryl Comes(LAST VISIT AUG 2012)   Echo 12/11: EF 55-60%, Mild LVH, grade 1 diast dysfxn;   holter 1/12: ATach   Atypical chest pain    a. 07/2004 Cath: Clean cors;  b. 04/2010 Myoview: EF 63%, no ischemia   Blood transfusion without reported diagnosis 1969   Chronic kidney disease    left kidney small    Diabetes mellitus, type 2 (HCC)    ORAL AND INSULIN MEDS (LAST A1C  7.3)   Diverticulosis    Erythema CURRENT-- CLOSED ABD. WALL ABSCESS   PER PCP NOTE (03-31-11)- MRSA-- TAKES DOXYCYCLINE   GERD (gastroesophageal reflux disease)    CONTROLLED W/ PREVACID   History of kidney stones 2012   Hyperlipidemia    Hypertension    Insomnia    TAKES MEDS PRN   Neuropathy, peripheral    Obesity (BMI 30-39.9) 02/19/2015   Pneumonia    as child   Post op infection 11/07/12   left bunionectomy   Restless leg syndrome    Sepsis, urinary HISTORY - 2004   Sleep apnea    no cpap    Thyroid disease    Tremor    Vitamin D deficiency      Family History  Problem Relation Age of Onset   Hyperlipidemia Mother    Hypertension Mother    Heart attack Father    Lung disease Father    Heart disease Sister    Hypertension Sister    Colon polyps Sister    Hypertension Sister    Colon polyps Sister    Hypertension Sister    Hypertension Sister    Lung cancer Sister 58       stage 4    Diabetes Other    Breast cancer Other    Heart disease Other    Colon cancer Other 61       nephew   Esophageal cancer Neg Hx    Rectal cancer Neg Hx    Stomach cancer Neg Hx    Amblyopia Neg Hx    Blindness Neg Hx    Cataracts Neg Hx    Glaucoma Neg Hx    Retinal detachment Neg Hx    Strabismus Neg Hx    Retinitis pigmentosa Neg Hx      Social History   Socioeconomic History   Marital status: Married    Spouse name: Not on file    Number of children: 2   Years of education: 12   Highest education level: High school graduate  Occupational History   Occupation: Engineer, materials middle school    Employer: [employer]    Comment: in office   Occupation: Retired  Tobacco Use   Smoking status: Every Day    Packs/day: 2.00    Years: 43.00    Total pack years: 86.00    Types: Cigarettes   Smokeless tobacco: Never   Tobacco comments:    1/2 ppd 12/29/21  Vaping Use   Vaping Use: Never used  Substance and Sexual Activity   Alcohol use: No    Alcohol/week: 0.0 standard drinks of alcohol   Drug use: No   Sexual activity: Not on file  Other Topics Concern   Not on file  Social History Narrative   2 children, 2 stepchildren   Lives with husband.   Right-handed.   No daily caffeine.   Social Determinants of Health   Financial Resource Strain: Low Risk  (01/08/2022)   Overall Financial Resource Strain (CARDIA)    Difficulty of Paying Living Expenses: Not hard at all  Food Insecurity: No Food Insecurity (01/08/2022)   Hunger Vital Sign    Worried About Running Out of Food in the Last Year: Never true    Ran Out of Food in the Last Year: Never true  Transportation Needs: No Transportation Needs (01/08/2022)   PRAPARE - Hydrologist (Medical): No    Lack of Transportation (Non-Medical): No  Physical Activity: Insufficiently Active (01/08/2022)   Exercise Vital Sign    Days of Exercise per Week: 3 days    Minutes of Exercise per Session: 30 min  Stress: No Stress Concern Present (01/08/2022)   [place]    Feeling of Stress : Not at all  Social Connections: Moderately Integrated (01/08/2022)   Social Connection and Isolation Panel [NHANES]    Frequency of Communication with Friends and Family: Three times a week    Frequency of Social Gatherings with Friends and Family: Once a week    Attends Religious Services: More than 4  times per year    Active Member of [employer] or Organizations: No    Attends Archivist Meetings: Never  Marital Status: Married  Human resources officer Violence: Not At Risk (01/08/2022)   Humiliation, Afraid, Rape, and Kick questionnaire    Fear of Current or Ex-Partner: No    Emotionally Abused: No    Physically Abused: No    Sexually Abused: No     No Known Allergies   Outpatient Medications Prior to Visit  Medication Sig Dispense Refill   albuterol (VENTOLIN HFA) 108 (90 Base) MCG/ACT inhaler Inhale 2 puffs into the lungs every 6 (six) hours as needed for wheezing or shortness of breath. 8 g 6   Alcohol Swabs (DROPSAFE ALCOHOL PREP) 70 % PADS USE AS DIRECTED  AS NEEDED. 200 each 3   aspirin EC 81 MG tablet Take 81 mg by mouth daily.     atorvastatin (LIPITOR) 20 MG tablet TAKE 1 TABLET BY MOUTH EVERY DAY 90 tablet 1   Blood Glucose Monitoring Suppl (TRUE METRIX METER) w/Device KIT USE AS DIRECTED 1 kit 1   cholecalciferol (VITAMIN D) 25 MCG (1000 UNIT) tablet Take 1,000 Units by mouth daily.     clonazePAM (KLONOPIN) 1 MG tablet TAKE 1 TABLET AT BEDTIME FOR RESTLESS LEGS SYNDROME 30 tablet 3   diclofenac Sodium (VOLTAREN) 1 % GEL APPLY 2 GRAMS TO AFFECTED AREA 4 TIMES A DAY (Patient taking differently: Apply 1 Application topically 4 (four) times daily as needed (pain).) 300 g 1   fenofibrate 160 MG tablet TAKE 1 TABLET EVERY DAY 90 tablet 10   furosemide (LASIX) 20 MG tablet TAKE 1 TABLET EVERY DAY 90 tablet 1   gabapentin (NEURONTIN) 300 MG capsule Take 3 capsules (900 mg total) by mouth 3 (three) times daily. 270 capsule 11   glucose blood (TRUE METRIX BLOOD GLUCOSE TEST) test strip Use as instructed to test blood sugar 4 times daily. Dx. E11.9 400 each 3   Insulin Pen Needle (DROPLET PEN NEEDLES) 31G X 8 MM MISC USE 4 TIMES DAILY 400 each 6   Lancets Super Thin 28G MISC Please use as directed to test sugars 4 times daily. Dx. E11.9 420 each 3   lansoprazole (PREVACID) 15 MG  capsule Take 15 mg by mouth daily.     LANTUS SOLOSTAR 100 UNIT/ML Solostar Pen INJECT 60 UNITS UNDER THE SKIN AT BEDTIME 60 mL 10   levothyroxine (SYNTHROID) 50 MCG tablet TAKE 1 TABLET BY MOUTH EVERY DAY BEFORE BREAKFAST 90 tablet 1   loperamide (IMODIUM A-D) 2 MG tablet Take 2 mg by mouth 4 (four) times daily as needed for diarrhea or loose stools.     neomycin-bacitracin-polymyxin [phone] OINT Apply 1 Application topically daily.     NOVOLOG FLEXPEN 100 UNIT/ML FlexPen INJECT 25 UNITS SUBCUTANEOUSLY THREE TIMES DAILY WITH MEALS 75 mL 0   primidone (MYSOLINE) 50 MG tablet Take 1 tablet (50 mg total) by mouth 2 (two) times daily. 180 tablet 1   traZODone (DESYREL) 100 MG tablet TAKE 1 TABLET AT BEDTIME 90 tablet 3   valsartan (DIOVAN) 320 MG tablet TAKE 1 TABLET EVERY DAY 90 tablet 2   verapamil (CALAN-SR) 120 MG CR tablet TAKE 1 TABLET BY MOUTH EVERYDAY AT BEDTIME 90 tablet 1   Vibegron (GEMTESA) 75 MG TABS Take 1 tablet by mouth daily.     Fluticasone-Umeclidin-Vilant (TRELEGY ELLIPTA) 100-62.5-25 MCG/ACT AEPB Inhale 1 puff into the lungs daily. 28 each 11   ipratropium-albuterol (DUONEB) 0.5-2.5 (3) MG/3ML SOLN Take 3 mLs by nebulization every 4 (four) hours as needed. 360 mL 2   nitrofurantoin, macrocrystal-monohydrate, (MACROBID) 100 MG capsule  Take 1 capsule by mouth in the morning and at bedtime. (Patient not taking: Reported on 01/08/2022)     No facility-administered medications prior to visit.    Review of Systems  Constitutional:  Negative for chills, fever, malaise/fatigue and weight loss.  HENT:  Positive for congestion and sore throat. Negative for sinus pain.   Eyes: Negative.   Respiratory:  Positive for cough and shortness of breath. Negative for hemoptysis, sputum production and wheezing.   Cardiovascular:  Negative for chest pain, palpitations, orthopnea, claudication and leg swelling.  Gastrointestinal:  Negative for abdominal pain, heartburn, nausea and vomiting.   Genitourinary: Negative.   Musculoskeletal:  Negative for joint pain and myalgias.  Skin:  Negative for rash.  Neurological:  Negative for weakness.  Endo/Heme/Allergies:  Positive for environmental allergies.  Psychiatric/Behavioral: Negative.       Objective:   Vitals:   03/23/22 1302  BP: 128/72  Pulse: 74  SpO2: 96%  Height: _0  (1.549 m)    Physical Exam Constitutional:      General: She is not in acute distress.    Appearance: She is obese. She is not ill-appearing.  HENT:     Head: Normocephalic and atraumatic.  Cardiovascular:     Rate and Rhythm: Normal rate and regular rhythm.     Pulses: Normal pulses.     Heart sounds: Normal heart sounds. No murmur heard. Pulmonary:     Effort: Pulmonary effort is normal.     Breath sounds: Normal breath sounds. No wheezing, rhonchi or rales.  Musculoskeletal:     Right lower leg: No edema.     Left lower leg: No edema.  Skin:    General: Skin is warm and dry.  Neurological:     General: No focal deficit present.     Mental Status: She is alert.  Psychiatric:        Mood and Affect: Mood normal.        Behavior: Behavior normal.        Thought Content: Thought content normal.        Judgment: Judgment normal.    CBC    Component Value Date/Time   WBC 7.5 01/01/2022 1036   RBC 4.18 01/01/2022 1036   HGB 13.1 01/01/2022 1036   HGB 12.7 11/27/2021 1107   HCT 39.6 01/01/2022 1036   HCT 38.7 11/27/2021 1107   PLT 157.0 01/01/2022 1036   PLT 179 11/27/2021 1107   MCV 94.6 01/01/2022 1036   MCV 95 11/27/2021 1107   MCH 31.1 11/27/2021 1107   MCH 29.5 07/08/2016 1735   MCHC 33.2 01/01/2022 1036   RDW 13.8 01/01/2022 1036   RDW 12.6 11/27/2021 1107   LYMPHSABS 1.7 01/01/2022 1036   MONOABS 0.5 01/01/2022 1036   EOSABS 0.1 01/01/2022 1036   BASOSABS 0.1 01/01/2022 1036      Latest Ref Rng & Units 01/01/2022   10:36 AM 11/27/2021   11:07 AM 09/24/2021   10:51 AM  BMP  Glucose 70 - 99 mg/dL 148  130  143    BUN 6 - 23 mg/dL _1 Creatinine 0.40 - 1.20 mg/dL 1.07  0.95  1.07   BUN/Creat Ratio 12 - 28  16    Sodium 135 - 145 mEq/L 139  142  139   Potassium 3.5 - 5.1 mEq/L 4.5  4.2  4.5 Hemolysis seen..   Chloride 96 - 112 mEq/L 103  104  103   CO2  19 - 32 mEq/L _0 Calcium 8.4 - 10.5 mg/dL 9.3  9.2  9.1    Chest imaging: [facility] CT Chest 10/16/20 Mediastinum/Nodes: No mediastinal or definite hilar adenopathy, given limitations of unenhanced CT.   Lungs/Pleura: No pleural fluid. Mild centrilobular emphysema. Bilateral calcified and noncalcified pulmonary nodules are similar. The largest noncalcified nodule measures volume derived equivalent diameter 3.4 mm.  PFT:     No data to display         Labs:  Path:  Echo 05/06/21: LV EF 60-65%. Mild LVH. Grade II diastolic dysfunction. RV systolic function and size are normal.   Heart Catheterization:  Assessment & Plan:   Centrilobular emphysema ([place])  Cigarette smoker  Discussion: Lacey [name] is a 71 year old woman, daily smoker with DM II, CKD, GERD, hypertension, sleep apnea not on CPAP and obesity who returns to pulmonary clinic for shortness of breath.   She has no air flow limitation on PFTs today.   Given her urinary retention in setting of duonebs and trelegy we will stop these medications as she has not noted significant changes in her breathing.   She can continue as needed albuterol. She can continue flutter.   Recommend smoking cessation using 7m nicotine patches per day and as needed nicotine lozenges or gum.   Follow up in 1 month via video visit to check in off maintenance inhaler therapy.   JFreda Jackson MD LLake [facility]ton[facility] Care Office: [phone]  Current Outpatient Medications:    albuterol (VENTOLIN HFA) 108 (90 Base) MCG/ACT inhaler, Inhale 2 puffs into the lungs every 6 (six) hours as needed for wheezing or shortness of breath., Disp: 8 g, Rfl: 6   Alcohol Swabs  (DROPSAFE ALCOHOL PREP) 70 % PADS, USE AS DIRECTED  AS NEEDED., Disp: 200 each, Rfl: 3   aspirin EC 81 MG tablet, Take 81 mg by mouth daily., Disp: , Rfl:    atorvastatin (LIPITOR) 20 MG tablet, TAKE 1 TABLET BY MOUTH EVERY DAY, Disp: 90 tablet, Rfl: 1   Blood Glucose Monitoring Suppl (TRUE METRIX METER) w/Device KIT, USE AS DIRECTED, Disp: 1 kit, Rfl: 1   cholecalciferol (VITAMIN D) 25 MCG (1000 UNIT) tablet, Take 1,000 Units by mouth daily., Disp: , Rfl:    clonazePAM (KLONOPIN) 1 MG tablet, TAKE 1 TABLET AT BEDTIME FOR RESTLESS LEGS SYNDROME, Disp: 30 tablet, Rfl: 3   diclofenac Sodium (VOLTAREN) 1 % GEL, APPLY 2 GRAMS TO AFFECTED AREA 4 TIMES A DAY (Patient taking differently: Apply 1 Application topically 4 (four) times daily as needed (pain).), Disp: 300 g, Rfl: 1   fenofibrate 160 MG tablet, TAKE 1 TABLET EVERY DAY, Disp: 90 tablet, Rfl: 10   furosemide (LASIX) 20 MG tablet, TAKE 1 TABLET EVERY DAY, Disp: 90 tablet, Rfl: 1   gabapentin (NEURONTIN) 300 MG capsule, Take 3 capsules (900 mg total) by mouth 3 (three) times daily., Disp: 270 capsule, Rfl: 11   glucose blood (TRUE METRIX BLOOD GLUCOSE TEST) test strip, Use as instructed to test blood sugar 4 times daily. Dx. E11.9, Disp: 400 each, Rfl: 3   Insulin Pen Needle (DROPLET PEN NEEDLES) 31G X 8 MM MISC, USE 4 TIMES DAILY, Disp: 400 each, Rfl: 6   Lancets Super Thin 28G MISC, Please use as directed to test sugars 4 times daily. Dx. E11.9, Disp: 420 each, Rfl: 3   lansoprazole (PREVACID) 15 MG capsule, Take 15 mg by mouth daily., Disp: , Rfl:  LANTUS SOLOSTAR 100 UNIT/ML Solostar Pen, INJECT 60 UNITS UNDER THE SKIN AT BEDTIME, Disp: 60 mL, Rfl: 10   levothyroxine (SYNTHROID) 50 MCG tablet, TAKE 1 TABLET BY MOUTH EVERY DAY BEFORE BREAKFAST, Disp: 90 tablet, Rfl: 1   loperamide (IMODIUM A-D) 2 MG tablet, Take 2 mg by mouth 4 (four) times daily as needed for diarrhea or loose stools., Disp: , Rfl:    neomycin-bacitracin-polymyxin [phone]  OINT, Apply 1 Application topically daily., Disp: , Rfl:    NOVOLOG FLEXPEN 100 UNIT/ML FlexPen, INJECT 25 UNITS SUBCUTANEOUSLY THREE TIMES DAILY WITH MEALS, Disp: 75 mL, Rfl: 0   primidone (MYSOLINE) 50 MG tablet, Take 1 tablet (50 mg total) by mouth 2 (two) times daily., Disp: 180 tablet, Rfl: 1   traZODone (DESYREL) 100 MG tablet, TAKE 1 TABLET AT BEDTIME, Disp: 90 tablet, Rfl: 3   valsartan (DIOVAN) 320 MG tablet, TAKE 1 TABLET EVERY DAY, Disp: 90 tablet, Rfl: 2   verapamil (CALAN-SR) 120 MG CR tablet, TAKE 1 TABLET BY MOUTH EVERYDAY AT BEDTIME, Disp: 90 tablet, Rfl: 1   Vibegron (GEMTESA) 75 MG TABS, Take 1 tablet by mouth daily., Disp: , Rfl:

## 2022-03-23 NOTE — Progress Notes (Signed)
PFT done today. 

## 2022-03-23 NOTE — Patient Instructions (Addendum)
Stop trelegy ellipta  Stop using duoneb nebulizer treatments  Monitor if your urinary retention gets better after stopping these medications.   Your breathing tests overall look normal today with some air trapping  Continue to use albuterol inhaler as needed, 1-2 puffs every 4-6 hours  Use nicotine lozenges or gum to help continue to cut back further  Recommend '7mg'$  nicotine patch per day  Follow up in 1 month via video visit

## 2022-03-28 ENCOUNTER — Other Ambulatory Visit: Payer: Self-pay | Admitting: Family Medicine

## 2022-04-05 LAB — PULMONARY FUNCTION TEST
DL/VA % pred: 96 %
DL/VA: 4.02 ml/min/mmHg/L
DLCO cor % pred: 98 %
DLCO cor: 18.2 ml/min/mmHg
DLCO unc % pred: 98 %
DLCO unc: 18.2 ml/min/mmHg
FEF 25-75 Post: 2.51 L/sec
FEF 25-75 Pre: 2 L/sec
FEF2575-%Change-Post: 25 %
FEF2575-%Pred-Post: 141 %
FEF2575-%Pred-Pre: 113 %
FEV1-%Change-Post: 6 %
FEV1-%Pred-Post: 100 %
FEV1-%Pred-Pre: 93 %
FEV1-Post: 2.1 L
FEV1-Pre: 1.96 L
FEV1FVC-%Change-Post: 0 %
FEV1FVC-%Pred-Pre: 107 %
FEV6-%Change-Post: 5 %
FEV6-%Pred-Post: 95 %
FEV6-%Pred-Pre: 90 %
FEV6-Post: 2.54 L
FEV6-Pre: 2.4 L
FEV6FVC-%Pred-Post: 104 %
FEV6FVC-%Pred-Pre: 104 %
FVC-%Change-Post: 6 %
FVC-%Pred-Post: 91 %
FVC-%Pred-Pre: 86 %
FVC-Post: 2.55 L
FVC-Pre: 2.4 L
Post FEV1/FVC ratio: 82 %
Post FEV6/FVC ratio: 100 %
Pre FEV1/FVC ratio: 82 %
Pre FEV6/FVC Ratio: 100 %
RV % pred: 117 %
RV: 2.5 L
TLC % pred: 105 %
TLC: 5.08 L

## 2022-04-08 ENCOUNTER — Other Ambulatory Visit: Payer: Self-pay | Admitting: Pulmonary Disease

## 2022-04-08 DIAGNOSIS — J449 Chronic obstructive pulmonary disease, unspecified: Secondary | ICD-10-CM

## 2022-04-14 ENCOUNTER — Telehealth (INDEPENDENT_AMBULATORY_CARE_PROVIDER_SITE_OTHER): Payer: Medicare PPO | Admitting: Pulmonary Disease

## 2022-04-14 DIAGNOSIS — J432 Centrilobular emphysema: Secondary | ICD-10-CM

## 2022-04-14 DIAGNOSIS — J4520 Mild intermittent asthma, uncomplicated: Secondary | ICD-10-CM

## 2022-04-14 NOTE — Patient Instructions (Addendum)
Continue with as needed albuterol 1-2 puffs every 4-6 hours as needed.   Follow up in 6 months

## 2022-04-14 NOTE — Progress Notes (Signed)
Virtual Visit via Video Note  I connected with Lacey [name] on 04/14/22 at 11:15 AM EST by a video enabled telemedicine application and verified that I am speaking with the correct person using two identifiers.  Location: Patient: home Provider: clinic   I discussed the limitations of evaluation and management by telemedicine and the availability of in person appointments. The patient expressed understanding and agreed to proceed.  History of Present Illness: Lacey [name] is a 71 year old woman, daily smoker with DM II, CKD, GERD, hypertension, sleep apnea not on CPAP and obesity who returns to pulmonary clinic for shortness of breath.    She stopped duonebs and trelegy at last visit due to urinary retention concerns. Her urinary symptoms have improved but not resolved. She has visit with urology in February. She has been using albuterol inhaler as needed, mainly before bedtime. Her sore throat has improved since stopping trelegy.  Observations/Objective: No acute distress No cough during interview   Assessment and Plan: Mild intermittent Asthma  - Continue to use albuterol inhaler as needed - call if respiratory symptoms worsen  Follow Up Instructions: Follow up in 6 months   I discussed the assessment and treatment plan with the patient. The patient was provided an opportunity to ask questions and all were answered. The patient agreed with the plan and demonstrated an understanding of the instructions.   The patient was advised to call back or seek an in-person evaluation if the symptoms worsen or if the condition fails to improve as anticipated.  I provided 20 minutes of non-face-to-face time during this encounter.   Freddi Starr, MD

## 2022-04-15 ENCOUNTER — Other Ambulatory Visit: Payer: Self-pay | Admitting: Neurology

## 2022-04-17 ENCOUNTER — Other Ambulatory Visit: Payer: Self-pay | Admitting: Family Medicine

## 2022-04-21 NOTE — Progress Notes (Unsigned)
  Assessment/Plan:    Tremor Inderal LA was effective, but she had significant bradycardia and it had to be discontinued. She did have an abnormal DaTscan, albeit slightly so when I reviewed it.  She does not yet meet criteria for an atypical state, although it is my suspicion that she is more likely to develop an atypical state than idiopathic Parkinson's disease.   Skin biopsy was negative for alpha-synuclein.  This may mean she is at risk for a tauopathy (again, atypical state) but not idiopathic Parkinsons Disease  ***Primidone, 50 mg twice per day 2.  History of cerebral infarction  -She is on aspirin, 81 mg daily  -Talked about importance of blood pressure control with a goal <130/80 mm Hg.   -Talked about importance of lipid control and proper diet.  Lipids should be managed intensively, with a goal LDL < 70 mg/dL.  She is on Lipitor, 20 mg daily.    2.  Gait instability             -Likely multifactorial.  She is on multiple medications that may contribute to falls, including clonazepam, trazodone, fairly high-dose gabapentin (900 mg 3 times per day).     3.  Hx of nephrolithiasis             -Drugs like topiramate and Zonegran would not be able to be used because of this for her tremor.  Subjective:   Lacey [name] was seen today in follow up for testing results. Pt with husband who supplements hx.   she had skin biopsy done since our last visit.  This was negative for alpha-synuclein.  It was negative for small fiber neuropathy.  It was negative for amyloid neuropathy.  We did start her on primidone.  She reports that ***  Current movement disorder medications: Primidone, 50 mg twice per day  Prior medications: Propranolol (bradycardia)   ALLERGIES:  No Known Allergies  CURRENT MEDICATIONS:  Outpatient Encounter Medications as of 04/23/2022  Medication Sig   albuterol (VENTOLIN HFA) 108 (90 Base) MCG/ACT inhaler TAKE 2 PUFFS BY MOUTH EVERY 6 HOURS AS NEEDED FOR WHEEZE  OR SHORTNESS OF BREATH   Alcohol Swabs (DROPSAFE ALCOHOL PREP) 70 % PADS USE AS DIRECTED  AS NEEDED.   aspirin EC 81 MG tablet Take 81 mg by mouth daily.   atorvastatin (LIPITOR) 20 MG tablet TAKE 1 TABLET BY MOUTH EVERY DAY   Blood Glucose Monitoring Suppl (TRUE METRIX METER) w/Device KIT USE AS DIRECTED   cholecalciferol (VITAMIN D) 25 MCG (1000 UNIT) tablet Take 1,000 Units by mouth daily.   clonazePAM (KLONOPIN) 1 MG tablet TAKE 1 TABLET AT BEDTIME FOR RESTLESS LEGS SYNDROME   diclofenac Sodium (VOLTAREN) 1 % GEL APPLY 2 GRAMS TO AFFECTED AREA 4 TIMES A DAY (Patient taking differently: Apply 1 Application topically 4 (four) times daily as needed (pain).)   fenofibrate 160 MG tablet TAKE 1 TABLET EVERY DAY   furosemide (LASIX) 20 MG tablet TAKE 1 TABLET EVERY DAY   gabapentin (NEURONTIN) 300 MG capsule Take 3 capsules (900 mg total) by mouth 3 (three) times daily.   glucose blood (TRUE METRIX BLOOD GLUCOSE TEST) test strip Use as instructed to test blood sugar 4 times daily. Dx. E11.9   Insulin Pen Needle (DROPLET PEN NEEDLES) 31G X 8 MM MISC USE 4 TIMES DAILY   Lancets Super Thin 28G MISC Please use as directed to test sugars 4 times daily. Dx. E11.9   lansoprazole (PREVACID) 15 MG capsule Take   15 mg by mouth daily.   LANTUS SOLOSTAR 100 UNIT/ML Solostar Pen INJECT 60 UNITS UNDER THE SKIN AT BEDTIME   levothyroxine (SYNTHROID) 50 MCG tablet TAKE 1 TABLET BY MOUTH EVERY DAY BEFORE BREAKFAST   loperamide (IMODIUM A-D) 2 MG tablet Take 2 mg by mouth 4 (four) times daily as needed for diarrhea or loose stools.   neomycin-bacitracin-polymyxin 3.5-400-5000 OINT Apply 1 Application topically daily.   NOVOLOG FLEXPEN 100 UNIT/ML FlexPen INJECT 25 UNITS SUBCUTANEOUSLY THREE TIMES DAILY WITH MEALS   primidone (MYSOLINE) 50 MG tablet TAKE 1 TABLET BY MOUTH TWICE A DAY   traZODone (DESYREL) 100 MG tablet TAKE 1 TABLET AT BEDTIME   valsartan (DIOVAN) 320 MG tablet TAKE 1 TABLET EVERY DAY   verapamil  (CALAN-SR) 120 MG CR tablet TAKE 1 TABLET BY MOUTH EVERYDAY AT BEDTIME   Vibegron (GEMTESA) 75 MG TABS Take 1 tablet by mouth daily.   No facility-administered encounter medications on file as of 04/23/2022.    Objective:   PHYSICAL EXAMINATION:    VITALS:   There were no vitals filed for this visit.    GEN:  The patient appears stated age and is in NAD. HEENT:  Normocephalic, atraumatic.  The mucous membranes are moist. The superficial temporal arteries are without ropiness or tenderness. CV:  RRR Lungs:  CTAB Neck/HEME:  There are no carotid bruits bilaterally.   Neurological examination:   Orientation: The patient is alert and oriented x3.  Cranial nerves: There is good facial symmetry.  Extraocular muscles are intact. The visual fields are full to confrontational testing. The speech is fluent and clear. Soft palate rises symmetrically and there is no tongue deviation. Hearing is intact to conversational tone. Sensation: Sensation is intact to light touch throughout (facial, trunk, extremities). Vibration is intact at the bilateral big toe. There is no extinction with double simultaneous stimulation.  Motor: Strength is 5/5 in the bilateral upper and lower extremities.   Shoulder shrug is equal and symmetric.  There is no pronator drift.    Movement examination: Tone: There is normal tone in the bilateral upper extremities.  The tone in the lower extremities is normal.  Abnormal movements: there is a rest tremor, intermittent, on the L but it doesn't increase with distraction.  She does have lower extremity tremor bilaterally today.  When she first gets up, she has what looks like orthostatic tremor.  She has some jaw tremor.  She has no postural tremor in the UE but there is intention tremor on the R.  S  Coordination:  There is no decremation with RAM's, with any form of RAMS, including alternating supination and pronation of the forearm, hand opening and closing, finger taps,  heel taps and toe taps. Gait and Station: The patient has mild difficulty arising out of a deep-seated chair without the use of the hands. The patient's stride length is good but she has slightly antalgic gait with the R leg appearing somewhat shorter than the L.  She has almost no arm swing bilaterally.  she leans to the right when she sits.  She does okay in the turns today.  I have reviewed and interpreted the following labs independently    Chemistry      Component Value Date/Time   NA 139 01/01/2022 1036   NA 142 11/27/2021 1107   K 4.5 01/01/2022 1036   CL 103 01/01/2022 1036   CO2 29 01/01/2022 1036   BUN 19 01/01/2022 1036   BUN 15 11/27/2021 1107     CREATININE 1.07 01/01/2022 1036   CREATININE 0.89 11/18/2012 1433      Component Value Date/Time   CALCIUM 9.3 01/01/2022 1036   ALKPHOS 56 01/01/2022 1036   AST 21 01/01/2022 1036   ALT 16 01/01/2022 1036   BILITOT 0.3 01/01/2022 1036       Lab Results  Component Value Date   WBC 7.5 01/01/2022   HGB 13.1 01/01/2022   HCT 39.6 01/01/2022   MCV 94.6 01/01/2022   PLT 157.0 01/01/2022    Lab Results  Component Value Date   TSH 2.39 01/01/2022   Lab Results  Component Value Date   CHOL 147 01/01/2022   HDL 47.20 01/01/2022   LDLCALC 77 01/01/2022   LDLDIRECT 86.0 05/02/2015   TRIG 112.0 01/01/2022   CHOLHDL 3 01/01/2022   Lab Results  Component Value Date   HGBA1C 7.3 (H) 01/01/2022     Total time spent on today's visit was *** minutes, including both face-to-face time and nonface-to-face time.  Time included that spent on review of records (prior notes available to me/labs/imaging if pertinent), discussing treatment and goals, answering patient's questions and coordinating care.  Cc:  Midge Minium, MD

## 2022-04-23 ENCOUNTER — Encounter: Payer: Self-pay | Admitting: Neurology

## 2022-04-23 ENCOUNTER — Ambulatory Visit: Payer: Medicare PPO | Admitting: Neurology

## 2022-04-23 VITALS — BP 160/64 | HR 68 | Ht 62.0 in | Wt 225.0 lb

## 2022-04-23 DIAGNOSIS — R251 Tremor, unspecified: Secondary | ICD-10-CM | POA: Diagnosis not present

## 2022-04-23 MED ORDER — PRIMIDONE 50 MG PO TABS: ORAL_TABLET | ORAL | 2 refills | Status: DC

## 2022-04-23 NOTE — Patient Instructions (Addendum)
Increase primidone 50 mg, 2 in the AM, 1 in the evening  The physicians and staff at [facility] are committed to providing excellent care. You may receive a survey requesting feedback about your experience at our office. We strive to receive "very good" responses to the survey questions. If you feel that your experience would prevent you from giving the office a "very good " response, please contact our office to try to remedy the situation. We may be reached at [phone]. Thank you for taking the time out of your busy day to complete the survey.

## 2022-04-29 ENCOUNTER — Ambulatory Visit: Payer: Medicare PPO | Admitting: Family Medicine

## 2022-05-01 ENCOUNTER — Ambulatory Visit: Payer: Medicare PPO | Admitting: Neurology

## 2022-05-05 ENCOUNTER — Telehealth: Payer: Self-pay | Admitting: Family Medicine

## 2022-05-05 ENCOUNTER — Ambulatory Visit (INDEPENDENT_AMBULATORY_CARE_PROVIDER_SITE_OTHER): Payer: Medicare HMO | Admitting: Family Medicine

## 2022-05-05 ENCOUNTER — Encounter: Payer: Self-pay | Admitting: Family Medicine

## 2022-05-05 VITALS — BP 128/68 | HR 56 | Temp 98.1°F | Resp 17 | Ht 62.0 in | Wt 229.1 lb

## 2022-05-05 DIAGNOSIS — Z794 Long term (current) use of insulin: Secondary | ICD-10-CM

## 2022-05-05 DIAGNOSIS — E113391 Type 2 diabetes mellitus with moderate nonproliferative diabetic retinopathy without macular edema, right eye: Secondary | ICD-10-CM | POA: Diagnosis not present

## 2022-05-05 LAB — HEMOGLOBIN A1C: Hgb A1c MFr Bld: 6.7 % — ABNORMAL HIGH (ref 4.6–6.5)

## 2022-05-05 LAB — BASIC METABOLIC PANEL
BUN: 16 mg/dL (ref 6–23)
CO2: 28 mEq/L (ref 19–32)
Calcium: 9.2 mg/dL (ref 8.4–10.5)
Chloride: 103 mEq/L (ref 96–112)
Creatinine, Ser: 0.9 mg/dL (ref 0.40–1.20)
GFR: 64.1 mL/min (ref >60.00)
Glucose, Bld: 131 mg/dL — ABNORMAL HIGH (ref 70–99)
Potassium: 4.3 mEq/L (ref 3.5–5.1)
Sodium: 139 mEq/L (ref 135–145)

## 2022-05-05 MED ORDER — DEXCOM G6 SENSOR MISC: 3 refills | Status: DC

## 2022-05-05 MED ORDER — DEXCOM G6 RECEIVER DEVI: 0 refills | Status: DC

## 2022-05-05 MED ORDER — DEXCOM G6 TRANSMITTER MISC: 0 refills | Status: DC

## 2022-05-05 NOTE — Patient Instructions (Signed)
Follow up in 3-4 months to recheck sugars, blood pressure, cholesterol We'll notify you of your lab results and make any changes if needed Keep up the good work on healthy diet and regular physical activity- you're doing great! Call with any questions or concerns Stay Safe!  Stay Healthy! Happy Early Rudene Anda!!

## 2022-05-05 NOTE — Progress Notes (Unsigned)
   Subjective:    Patient ID: Lacey [name], female    DOB: 08/29/1950, 73 y.o.   MRN: [id number]  HPI DM- chronic problem, currently on Lantus 60 units nightly, Novolog 25 units TID.  UTD on eye exam, foot exam, microalbumin.  Last A1C 7.3%  Pt reports occasional symptomatic lows.  Pt reports feeling better since decreasing doses of insulin.  No CP, SOB, HA's, visual changes, abd pain, N/V.  No numbness and tingling above baseline.   Review of Systems For ROS see HPI     Objective:   Physical Exam Vitals reviewed.  Constitutional:      General: She is not in acute distress.    Appearance: Normal appearance. She is well-developed. She is obese. She is not ill-appearing.  HENT:     Head: Normocephalic and atraumatic.  Eyes:     Conjunctiva/sclera: Conjunctivae normal.     Pupils: Pupils are equal, round, and reactive to light.  Neck:     Thyroid: No thyromegaly.  Cardiovascular:     Rate and Rhythm: Normal rate and regular rhythm.     Pulses: Normal pulses.     Heart sounds: Normal heart sounds. No murmur heard. Pulmonary:     Effort: Pulmonary effort is normal. No respiratory distress.     Breath sounds: Normal breath sounds.  Abdominal:     General: There is no distension.     Palpations: Abdomen is soft.     Tenderness: There is no abdominal tenderness.  Musculoskeletal:     Cervical back: Normal range of motion and neck supple.     Right lower leg: No edema.     Left lower leg: No edema.  Lymphadenopathy:     Cervical: No cervical adenopathy.  Skin:    General: Skin is warm and dry.  Neurological:     Mental Status: She is alert and oriented to person, place, and time.  Psychiatric:        Behavior: Behavior normal.           Assessment & Plan:

## 2022-05-06 NOTE — Assessment & Plan Note (Signed)
Chronic problem.  Currently feeling much better since we adjusted her insulin doses.  Now on Lantus 60 units nightly and Novolog 25 units TID.  UTD on eye exam, foot exam, microalbumin.  Continues to have some symptomatic lows.  She reports new insurance will cover Dexcom 6 and she would like to get this in order to better track sugars.  Prescription sent.  Check labs.  Adjust meds prn

## 2022-05-12 NOTE — Telephone Encounter (Signed)
Please close encounter

## 2022-05-21 ENCOUNTER — Encounter: Payer: Self-pay | Admitting: Family Medicine

## 2022-05-21 DIAGNOSIS — G2581 Restless legs syndrome: Secondary | ICD-10-CM

## 2022-05-21 MED ORDER — CLONAZEPAM 1 MG PO TABS: ORAL_TABLET | ORAL | 3 refills | Status: DC

## 2022-06-05 ENCOUNTER — Other Ambulatory Visit: Payer: Self-pay | Admitting: Neurology

## 2022-06-08 DIAGNOSIS — R3915 Urgency of urination: Secondary | ICD-10-CM | POA: Diagnosis not present

## 2022-06-08 DIAGNOSIS — N261 Atrophy of kidney (terminal): Secondary | ICD-10-CM | POA: Diagnosis not present

## 2022-06-08 DIAGNOSIS — N281 Cyst of kidney, acquired: Secondary | ICD-10-CM | POA: Diagnosis not present

## 2022-06-08 DIAGNOSIS — N3020 Other chronic cystitis without hematuria: Secondary | ICD-10-CM | POA: Diagnosis not present

## 2022-06-12 ENCOUNTER — Other Ambulatory Visit: Payer: Self-pay

## 2022-06-12 ENCOUNTER — Telehealth: Payer: Self-pay | Admitting: Family Medicine

## 2022-06-12 MED ORDER — LANTUS SOLOSTAR 100 UNIT/ML ~~LOC~~ SOPN: PEN_INJECTOR | SUBCUTANEOUS | 10 refills | Status: DC

## 2022-06-12 MED ORDER — NOVOLOG FLEXPEN 100 UNIT/ML ~~LOC~~ SOPN: PEN_INJECTOR | SUBCUTANEOUS | 0 refills | Status: DC

## 2022-06-12 NOTE — Telephone Encounter (Signed)
Spoke to patient and let her know that I have sent in her rx

## 2022-06-12 NOTE — Telephone Encounter (Signed)
Encourage patient to contact the pharmacy for refills or they can request refills through [facility]  (Please schedule appointment if patient has not been seen in over a year)    WHAT PHARMACY WOULD THEY LIKE THIS SENT TO:  [id number] [place], [place] - [id number] UNIT/ML FlexPen and LANTUS SOLOSTAR 100 UNIT/ML Solostar Pen   NOTES/COMMENTS FROM PATIENT: patient needs refills on both insulin pens.      [place] It takes 48-72 hours to process rx refill requests Ask patient to call pharmacy to ensure rx is ready before heading there.

## 2022-06-30 ENCOUNTER — Telehealth: Payer: Self-pay

## 2022-06-30 NOTE — Telephone Encounter (Signed)
[employer] sent a letter of approval for her Aspart flexpen  approval from 05/04/22-05/04/23. Pt has been made aware

## 2022-07-01 ENCOUNTER — Other Ambulatory Visit: Payer: Self-pay | Admitting: Family Medicine

## 2022-07-01 ENCOUNTER — Other Ambulatory Visit (HOSPITAL_COMMUNITY): Payer: Self-pay

## 2022-07-11 ENCOUNTER — Encounter (HOSPITAL_COMMUNITY): Payer: Self-pay

## 2022-07-11 ENCOUNTER — Observation Stay (HOSPITAL_COMMUNITY)
Admission: EM | Admit: 2022-07-11 | Discharge: 2022-07-14 | Disposition: A | Discharge status: Home Health | Payer: Medicare HMO | Attending: Internal Medicine | Admitting: Internal Medicine

## 2022-07-11 ENCOUNTER — Emergency Department (HOSPITAL_COMMUNITY): Payer: Medicare HMO

## 2022-07-11 ENCOUNTER — Other Ambulatory Visit (HOSPITAL_COMMUNITY): Payer: Medicare HMO

## 2022-07-11 DIAGNOSIS — G2581 Restless legs syndrome: Secondary | ICD-10-CM | POA: Diagnosis not present

## 2022-07-11 DIAGNOSIS — I5032 Chronic diastolic (congestive) heart failure: Secondary | ICD-10-CM | POA: Diagnosis not present

## 2022-07-11 DIAGNOSIS — G4733 Obstructive sleep apnea (adult) (pediatric): Secondary | ICD-10-CM | POA: Diagnosis present

## 2022-07-11 DIAGNOSIS — Z720 Tobacco use: Secondary | ICD-10-CM | POA: Diagnosis present

## 2022-07-11 DIAGNOSIS — M79601 Pain in right arm: Secondary | ICD-10-CM | POA: Diagnosis not present

## 2022-07-11 DIAGNOSIS — Z7982 Long term (current) use of aspirin: Secondary | ICD-10-CM | POA: Diagnosis not present

## 2022-07-11 DIAGNOSIS — E039 Hypothyroidism, unspecified: Secondary | ICD-10-CM | POA: Diagnosis not present

## 2022-07-11 DIAGNOSIS — R251 Tremor, unspecified: Secondary | ICD-10-CM | POA: Diagnosis present

## 2022-07-11 DIAGNOSIS — E1165 Type 2 diabetes mellitus with hyperglycemia: Secondary | ICD-10-CM | POA: Diagnosis not present

## 2022-07-11 DIAGNOSIS — S43014A Anterior dislocation of right humerus, initial encounter: Secondary | ICD-10-CM | POA: Insufficient documentation

## 2022-07-11 DIAGNOSIS — S42241A 4-part fracture of surgical neck of right humerus, initial encounter for closed fracture: Principal | ICD-10-CM | POA: Insufficient documentation

## 2022-07-11 DIAGNOSIS — Z01818 Encounter for other preprocedural examination: Secondary | ICD-10-CM | POA: Diagnosis not present

## 2022-07-11 DIAGNOSIS — Z79899 Other long term (current) drug therapy: Secondary | ICD-10-CM | POA: Diagnosis not present

## 2022-07-11 DIAGNOSIS — E1169 Type 2 diabetes mellitus with other specified complication: Secondary | ICD-10-CM | POA: Diagnosis present

## 2022-07-11 DIAGNOSIS — S43004A Unspecified dislocation of right shoulder joint, initial encounter: Secondary | ICD-10-CM | POA: Diagnosis not present

## 2022-07-11 DIAGNOSIS — F17210 Nicotine dependence, cigarettes, uncomplicated: Secondary | ICD-10-CM | POA: Insufficient documentation

## 2022-07-11 DIAGNOSIS — I4719 Other supraventricular tachycardia: Secondary | ICD-10-CM | POA: Diagnosis present

## 2022-07-11 DIAGNOSIS — M7521 Bicipital tendinitis, right shoulder: Secondary | ICD-10-CM | POA: Diagnosis not present

## 2022-07-11 DIAGNOSIS — I130 Hypertensive heart and chronic kidney disease with heart failure and stage 1 through stage 4 chronic kidney disease, or unspecified chronic kidney disease: Secondary | ICD-10-CM | POA: Diagnosis not present

## 2022-07-11 DIAGNOSIS — I2510 Atherosclerotic heart disease of native coronary artery without angina pectoris: Secondary | ICD-10-CM | POA: Insufficient documentation

## 2022-07-11 DIAGNOSIS — W010XXA Fall on same level from slipping, tripping and stumbling without subsequent striking against object, initial encounter: Secondary | ICD-10-CM | POA: Insufficient documentation

## 2022-07-11 DIAGNOSIS — S42351A Displaced comminuted fracture of shaft of humerus, right arm, initial encounter for closed fracture: Secondary | ICD-10-CM | POA: Diagnosis not present

## 2022-07-11 DIAGNOSIS — S42291A Other displaced fracture of upper end of right humerus, initial encounter for closed fracture: Secondary | ICD-10-CM

## 2022-07-11 DIAGNOSIS — W19XXXA Unspecified fall, initial encounter: Secondary | ICD-10-CM | POA: Diagnosis not present

## 2022-07-11 DIAGNOSIS — N189 Chronic kidney disease, unspecified: Secondary | ICD-10-CM | POA: Diagnosis not present

## 2022-07-11 DIAGNOSIS — I110 Hypertensive heart disease with heart failure: Secondary | ICD-10-CM | POA: Diagnosis not present

## 2022-07-11 DIAGNOSIS — E785 Hyperlipidemia, unspecified: Secondary | ICD-10-CM

## 2022-07-11 DIAGNOSIS — Z794 Long term (current) use of insulin: Secondary | ICD-10-CM | POA: Insufficient documentation

## 2022-07-11 DIAGNOSIS — I10 Essential (primary) hypertension: Secondary | ICD-10-CM | POA: Diagnosis present

## 2022-07-11 DIAGNOSIS — Z743 Need for continuous supervision: Secondary | ICD-10-CM | POA: Diagnosis not present

## 2022-07-11 DIAGNOSIS — Y93K1 Activity, walking an animal: Secondary | ICD-10-CM | POA: Diagnosis not present

## 2022-07-11 DIAGNOSIS — M79621 Pain in right upper arm: Secondary | ICD-10-CM | POA: Diagnosis present

## 2022-07-11 DIAGNOSIS — S43006A Unspecified dislocation of unspecified shoulder joint, initial encounter: Secondary | ICD-10-CM | POA: Diagnosis present

## 2022-07-11 DIAGNOSIS — M25511 Pain in right shoulder: Secondary | ICD-10-CM | POA: Diagnosis not present

## 2022-07-11 HISTORY — DX: Heart failure, unspecified: I50.9

## 2022-07-11 HISTORY — DX: Transient cerebral ischemic attack, unspecified: G45.9

## 2022-07-11 HISTORY — DX: Atherosclerotic heart disease of native coronary artery without angina pectoris: I25.10

## 2022-07-11 LAB — BASIC METABOLIC PANEL
Anion gap: 9 (ref 5–15)
BUN: 10 mg/dL (ref 8–23)
CO2: 24 mmol/L (ref 22–32)
Calcium: 8.6 mg/dL — ABNORMAL LOW (ref 8.9–10.3)
Chloride: 106 mmol/L (ref 98–111)
Creatinine, Ser: 0.83 mg/dL (ref 0.44–1.00)
GFR, Estimated: >60 mL/min (ref >60)
Glucose, Bld: 154 mg/dL — ABNORMAL HIGH (ref 70–99)
Potassium: 4.2 mmol/L (ref 3.5–5.1)
Sodium: 139 mmol/L (ref 135–145)

## 2022-07-11 LAB — CBC WITH DIFFERENTIAL/PLATELET
Abs Immature Granulocytes: 0.07 10*3/uL (ref 0.00–0.07)
Basophils Absolute: 0.1 10*3/uL (ref 0.0–0.1)
Basophils Relative: 1 %
Eosinophils Absolute: 0 10*3/uL (ref 0.0–0.5)
Eosinophils Relative: 0 %
HCT: 41 % (ref 36.0–46.0)
Hemoglobin: 13.4 g/dL (ref 12.0–15.0)
Immature Granulocytes: 1 %
Lymphocytes Relative: 8 %
Lymphs Abs: 1 10*3/uL (ref 0.7–4.0)
MCH: 31.8 pg (ref 26.0–34.0)
MCHC: 32.7 g/dL (ref 30.0–36.0)
MCV: 97.4 fL (ref 80.0–100.0)
Monocytes Absolute: 0.6 10*3/uL (ref 0.1–1.0)
Monocytes Relative: 5 %
Neutro Abs: 11.6 10*3/uL — ABNORMAL HIGH (ref 1.7–7.7)
Neutrophils Relative %: 85 %
Platelets: 170 10*3/uL (ref 150–400)
RBC: 4.21 MIL/uL (ref 3.87–5.11)
RDW: 13.3 % (ref 11.5–15.5)
WBC: 13.4 10*3/uL — ABNORMAL HIGH (ref 4.0–10.5)
nRBC: 0 % (ref 0.0–0.2)

## 2022-07-11 LAB — GLUCOSE, CAPILLARY
Glucose-Capillary: 208 mg/dL — ABNORMAL HIGH (ref 70–99)
Glucose-Capillary: 163 mg/dL — ABNORMAL HIGH (ref 70–99)

## 2022-07-11 LAB — CBG MONITORING, ED: Glucose-Capillary: 147 mg/dL — ABNORMAL HIGH (ref 70–99)

## 2022-07-11 MED ORDER — PANTOPRAZOLE SODIUM 20 MG PO TBEC
20.0000 mg | DELAYED_RELEASE_TABLET | Freq: Every day | ORAL | Status: DC
  Administered 2022-07-11 – 2022-07-14 (×4): 20 mg via ORAL
  Filled 2022-07-11 (×4): qty 1

## 2022-07-11 MED ORDER — FUROSEMIDE 20 MG PO TABS
20.0000 mg | ORAL_TABLET | Freq: Every day | ORAL | Status: DC
  Administered 2022-07-11: 20 mg via ORAL
  Filled 2022-07-11 (×2): qty 1

## 2022-07-11 MED ORDER — OXYCODONE HCL 5 MG PO TABS
5.0000 mg | ORAL_TABLET | ORAL | Status: DC | PRN
  Administered 2022-07-11 – 2022-07-14 (×8): 5 mg via ORAL
  Filled 2022-07-11 (×8): qty 1

## 2022-07-11 MED ORDER — PRIMIDONE 50 MG PO TABS: 100.0000 mg | ORAL_TABLET | Freq: Two times a day (BID) | ORAL | Status: DC

## 2022-07-11 MED ORDER — LEVOTHYROXINE SODIUM 50 MCG PO TABS
50.0000 ug | ORAL_TABLET | Freq: Every day | ORAL | Status: DC
  Administered 2022-07-12 – 2022-07-14 (×3): 50 ug via ORAL
  Filled 2022-07-11 (×4): qty 1

## 2022-07-11 MED ORDER — INSULIN ASPART 100 UNIT/ML IJ SOLN
0.0000 [IU] | Freq: Three times a day (TID) | INTRAMUSCULAR | Status: DC
  Administered 2022-07-11: 5 [IU] via SUBCUTANEOUS
  Administered 2022-07-12: 3 [IU] via SUBCUTANEOUS
  Administered 2022-07-12: 11 [IU] via SUBCUTANEOUS
  Administered 2022-07-13 – 2022-07-14 (×4): 8 [IU] via SUBCUTANEOUS

## 2022-07-11 MED ORDER — VITAMIN D 25 MCG (1000 UNIT) PO TABS
1000.0000 [IU] | ORAL_TABLET | Freq: Every day | ORAL | Status: DC
  Administered 2022-07-11 – 2022-07-14 (×4): 1000 [IU] via ORAL
  Filled 2022-07-11 (×4): qty 1

## 2022-07-11 MED ORDER — ENOXAPARIN SODIUM 40 MG/0.4ML IJ SOSY: 40.0000 mg | PREFILLED_SYRINGE | INTRAMUSCULAR | Status: DC

## 2022-07-11 MED ORDER — HYDROMORPHONE HCL 1 MG/ML IJ SOLN
0.5000 mg | Freq: Once | INTRAMUSCULAR | Status: AC
  Administered 2022-07-11: 0.5 mg via INTRAVENOUS
  Filled 2022-07-11: qty 1

## 2022-07-11 MED ORDER — PRIMIDONE 50 MG PO TABS
50.0000 mg | ORAL_TABLET | Freq: Every day | ORAL | Status: DC
  Administered 2022-07-11 – 2022-07-13 (×3): 50 mg via ORAL
  Filled 2022-07-11 (×4): qty 1

## 2022-07-11 MED ORDER — IPRATROPIUM-ALBUTEROL 0.5-2.5 (3) MG/3ML IN SOLN: 3.0000 mL | Freq: Once | RESPIRATORY_TRACT | Status: DC

## 2022-07-11 MED ORDER — IRBESARTAN 75 MG PO TABS
37.5000 mg | ORAL_TABLET | Freq: Every day | ORAL | Status: DC
  Filled 2022-07-11: qty 1

## 2022-07-11 MED ORDER — MIRABEGRON ER 25 MG PO TB24
25.0000 mg | ORAL_TABLET | Freq: Every day | ORAL | Status: DC
  Administered 2022-07-12 – 2022-07-14 (×3): 25 mg via ORAL
  Filled 2022-07-11 (×5): qty 1

## 2022-07-11 MED ORDER — VERAPAMIL HCL ER 120 MG PO TBCR
120.0000 mg | EXTENDED_RELEASE_TABLET | Freq: Every day | ORAL | Status: DC
  Administered 2022-07-12 – 2022-07-14 (×3): 120 mg via ORAL
  Filled 2022-07-11 (×3): qty 1

## 2022-07-11 MED ORDER — ONDANSETRON HCL 4 MG PO TABS: 4.0000 mg | ORAL_TABLET | Freq: Four times a day (QID) | ORAL | Status: DC | PRN

## 2022-07-11 MED ORDER — INSULIN GLARGINE-YFGN 100 UNIT/ML ~~LOC~~ SOLN
30.0000 [IU] | Freq: Every day | SUBCUTANEOUS | Status: DC
  Administered 2022-07-11 – 2022-07-13 (×3): 30 [IU] via SUBCUTANEOUS
  Filled 2022-07-11 (×6): qty 0.3

## 2022-07-11 MED ORDER — ATORVASTATIN CALCIUM 10 MG PO TABS
20.0000 mg | ORAL_TABLET | Freq: Every day | ORAL | Status: DC
  Administered 2022-07-11 – 2022-07-14 (×4): 20 mg via ORAL
  Filled 2022-07-11 (×4): qty 2

## 2022-07-11 MED ORDER — ONDANSETRON HCL 4 MG/2ML IJ SOLN: 4.0000 mg | Freq: Four times a day (QID) | INTRAMUSCULAR | Status: DC | PRN

## 2022-07-11 MED ORDER — PROPOFOL 10 MG/ML IV BOLUS
INTRAVENOUS | Status: AC | PRN
  Administered 2022-07-11: 20 mg via INTRAVENOUS

## 2022-07-11 MED ORDER — PRIMIDONE 50 MG PO TABS
100.0000 mg | ORAL_TABLET | Freq: Every day | ORAL | Status: DC
  Administered 2022-07-11 – 2022-07-14 (×4): 100 mg via ORAL
  Filled 2022-07-11 (×4): qty 2

## 2022-07-11 MED ORDER — OYSTER SHELL CALCIUM/D3 500-5 MG-MCG PO TABS
1.0000 | ORAL_TABLET | Freq: Two times a day (BID) | ORAL | Status: DC
  Administered 2022-07-12 – 2022-07-14 (×5): 1 via ORAL
  Filled 2022-07-11 (×5): qty 1

## 2022-07-11 MED ORDER — ASPIRIN 81 MG PO TBEC
81.0000 mg | DELAYED_RELEASE_TABLET | Freq: Every day | ORAL | Status: DC
  Administered 2022-07-11 – 2022-07-12 (×2): 81 mg via ORAL
  Filled 2022-07-11 (×2): qty 1

## 2022-07-11 MED ORDER — MORPHINE SULFATE (PF) 4 MG/ML IV SOLN
4.0000 mg | Freq: Once | INTRAVENOUS | Status: AC
  Administered 2022-07-11: 4 mg via INTRAVENOUS
  Filled 2022-07-11: qty 1

## 2022-07-11 MED ORDER — TRAZODONE HCL 100 MG PO TABS
100.0000 mg | ORAL_TABLET | Freq: Every day | ORAL | Status: DC
  Administered 2022-07-11 – 2022-07-13 (×3): 100 mg via ORAL
  Filled 2022-07-11 (×3): qty 1

## 2022-07-11 MED ORDER — ONDANSETRON HCL 4 MG/2ML IJ SOLN
4.0000 mg | Freq: Once | INTRAMUSCULAR | Status: AC
  Administered 2022-07-11: 4 mg via INTRAVENOUS
  Filled 2022-07-11: qty 2

## 2022-07-11 MED ORDER — GABAPENTIN 300 MG PO CAPS
900.0000 mg | ORAL_CAPSULE | Freq: Three times a day (TID) | ORAL | Status: DC
  Administered 2022-07-11 – 2022-07-14 (×9): 900 mg via ORAL
  Filled 2022-07-11 (×9): qty 3

## 2022-07-11 MED ORDER — CLONAZEPAM 0.5 MG PO TABS
1.0000 mg | ORAL_TABLET | Freq: Every day | ORAL | Status: DC | PRN
  Administered 2022-07-12: 1 mg via ORAL
  Filled 2022-07-11: qty 2

## 2022-07-11 MED ORDER — HYDRALAZINE HCL 20 MG/ML IJ SOLN
10.0000 mg | Freq: Four times a day (QID) | INTRAMUSCULAR | Status: DC | PRN
  Administered 2022-07-11: 10 mg via INTRAVENOUS
  Filled 2022-07-11: qty 1

## 2022-07-11 MED ORDER — IPRATROPIUM-ALBUTEROL 0.5-2.5 (3) MG/3ML IN SOLN: 3.0000 mL | Freq: Four times a day (QID) | RESPIRATORY_TRACT | Status: DC | PRN

## 2022-07-11 MED ORDER — PROPOFOL 10 MG/ML IV BOLUS
1.0000 mg/kg | Freq: Once | INTRAVENOUS | Status: AC
  Administered 2022-07-11: 50 mg via INTRAVENOUS
  Filled 2022-07-11: qty 20

## 2022-07-11 MED ORDER — MORPHINE SULFATE (PF) 2 MG/ML IV SOLN
2.0000 mg | INTRAVENOUS | Status: DC | PRN
  Administered 2022-07-11 – 2022-07-14 (×6): 2 mg via INTRAVENOUS
  Filled 2022-07-11 (×7): qty 1

## 2022-07-11 MED ORDER — FENOFIBRATE 160 MG PO TABS
160.0000 mg | ORAL_TABLET | Freq: Every day | ORAL | Status: DC
  Administered 2022-07-12 – 2022-07-14 (×3): 160 mg via ORAL
  Filled 2022-07-11 (×3): qty 1

## 2022-07-11 MED ORDER — INSULIN ASPART 100 UNIT/ML FLEXPEN: 10.0000 [IU] | PEN_INJECTOR | Freq: Three times a day (TID) | SUBCUTANEOUS | Status: DC

## 2022-07-11 MED ORDER — CHOLECALCIFEROL 25 MCG (1000 UT) PO TABS: 1000.0000 [IU] | ORAL_TABLET | Freq: Every day | ORAL | Status: DC

## 2022-07-11 MED ORDER — INSULIN ASPART 100 UNIT/ML IJ SOLN
10.0000 [IU] | Freq: Three times a day (TID) | INTRAMUSCULAR | Status: DC
  Administered 2022-07-11 – 2022-07-14 (×6): 10 [IU] via SUBCUTANEOUS

## 2022-07-11 MED ORDER — ACETAMINOPHEN 325 MG PO TABS
650.0000 mg | ORAL_TABLET | Freq: Four times a day (QID) | ORAL | Status: DC | PRN
  Administered 2022-07-13: 650 mg via ORAL
  Filled 2022-07-11: qty 2

## 2022-07-11 MED ORDER — ACETAMINOPHEN 650 MG RE SUPP: 650.0000 mg | Freq: Four times a day (QID) | RECTAL | Status: DC | PRN

## 2022-07-11 NOTE — H&P (Addendum)
History and Physical    DOA: 07/11/2022  PCP: Midge Minium, MD  Patient coming from: home  Chief Complaint: fall, rt shoulder pain  HPI: Lacey [name] is a 72 y.o. female with history h/o hypertension, DM, nonobstructive CAD, chronic anemia, atrial tachycardia, restless leg/tremors presents after a mechanical fall resulting in right shoulder dislocation.  Patient states she was walking her dog in the rain when she slipped on the ramp and fell onto her right side.  Immediately after the fall she had severe right shoulder pain and rolled over onto her back.  She was on the floor for some time as her husband could not hear her.  Eventually got up and presented to the ED due to severe pain.  Patient does have a history of DM and CAD/mild CHF but reports compliance with her medications, is quite active at baseline.  Patient reports having a positive stress test last year following which she underwent cath and was told "everything was good".  She is usually able to do all the household chores and walk her dog for more than a block without shortness of breath.  No recent chest pains or dizziness or palpitations. ED workup: Afebrile, pulse 56-73, RR 16, BP 145/101-198/179, O2 sat 100% on room air.  Labs showed WBC 13.4, hemoglobin 13.4, hematocrit 41, BUN 10, creatinine 0.83, calcium 8.6, glucose 154.Comminuted fracture of the proximal humerus with inferomedial dislocation of the humeral head, which rests against the subscapularis muscle. The humeral diaphysis is superiorly subluxed into the glenoid fossa.  Patient underwent closed reduction in the ED but plan for OR in AM.  Admission to medical service requested for comorbidity management.    Review of Systems: As per HPI, otherwise review of systems negative.    Past Medical History:  Diagnosis Date   Adenomatous colon polyp    hyperplastic   Anemia    Anginal pain ([place])    not recent   Arthritis    NECK, KNEES, FINGERS, TOES   Atrial  tachycardia CARDIOLOGIST - DR Caryl Comes (LAST VISIT AUG 2012)   Echo 12/11: EF 55-60%, Mild LVH, grade 1 diast dysfxn;   holter 1/12: ATach   Atypical chest pain    a. 07/2004 Cath: Clean cors;  b. 04/2010 Myoview: EF 63%, no ischemia   Blood transfusion without reported diagnosis 1969   Chronic kidney disease    left kidney small    Diabetes mellitus, type 2 (HCC)    ORAL AND INSULIN MEDS (LAST A1C  7.3)   Diverticulosis    Erythema CURRENT-- CLOSED ABD. WALL ABSCESS   PER PCP NOTE (03-31-11)- MRSA-- TAKES DOXYCYCLINE   GERD (gastroesophageal reflux disease)    CONTROLLED W/ PREVACID   History of kidney stones 2012   Hyperlipidemia    Hypertension    Insomnia    TAKES MEDS PRN   Neuropathy, peripheral    Obesity (BMI 30-39.9) 02/19/2015   Pneumonia    as child   Post op infection 11/07/12   left bunionectomy   Restless leg syndrome    Sepsis, urinary HISTORY - 2004   Sleep apnea    no cpap    Thyroid disease    Tremor    Vitamin D deficiency     Past Surgical History:  Procedure Laterality Date   ABDOMINAL HYSTERECTOMY  1995   ovaries remain   BUNIONECTOMY Left 10/24/2012   BUNIONECTOMY Right 05/17/2017   CARDIAC CATHETERIZATION  07/10/2004   CARPAL TUNNEL RELEASE  2000  RIGHT   CATARACT EXTRACTION, BILATERAL     CERVICAL FUSION  10/12/2007   C5 - 7   COLONOSCOPY     CYSTOSCOPY WITH RETROGRADE PYELOGRAM, URETEROSCOPY AND STENT PLACEMENT Left 11/28/2012   Procedure: LEFT RETROGRADE PYELOGRAM, LEFT URETEROSCOPY, ;  Surgeon: Claybon Jabs, MD;  Location: [facility];  Service: Urology;  Laterality: Left;   CYSTOSCOPY/RETROGRADE/URETEROSCOPY/STONE EXTRACTION WITH BASKET  X2 2004 & 2009   LEFT    GASTRIC BYPASS  1981   KNEE ARTHROSCOPY  12/2010   LEFT   LAPAROSCOPIC CHOLECYSTECTOMY  2001   LEFT HEART CATH AND CORONARY ANGIOGRAPHY N/A 12/03/2021   Procedure: LEFT HEART CATH AND CORONARY ANGIOGRAPHY;  Surgeon: Jettie Booze, MD;  Location: [facility];  Service:  Cardiovascular;  Laterality: N/A;   left thumb joint surgery  2013   PERCUTANEOUS NEPHROSTOLITHOTOMY  02/27/2011   LEFT   POLYPECTOMY     RIGHT THUMB JOINT SURG.  09/2009   TRIGGER FINGER RELEASE  2010   RIGHT THUMB   UPPER GASTROINTESTINAL ENDOSCOPY     URETERAL STENT PLACEMENT  X2  2004  &  2009   LEFT   URETEROSCOPY  04/06/2011   Procedure: URETEROSCOPY;  Surgeon: Claybon Jabs, MD;  Location: [facility];  Service: Urology;  Laterality: Left;  L Ureteroscopy Laser Litho & Stent     Social history:  reports that she has been smoking cigarettes. She has a 86.00 pack-year smoking history. She has never used smokeless tobacco. She reports that she does not drink alcohol and does not use drugs.   No Known Allergies  Family History  Problem Relation Age of Onset   Hyperlipidemia Mother    Hypertension Mother    Heart attack Father    Lung disease Father    Heart disease Sister    Hypertension Sister    Colon polyps Sister    Hypertension Sister    Colon polyps Sister    Hypertension Sister    Hypertension Sister    Lung cancer Sister 36       stage 4    Diabetes Other    Breast cancer Other    Heart disease Other    Colon cancer Other 53       nephew   Esophageal cancer Neg Hx    Rectal cancer Neg Hx    Stomach cancer Neg Hx    Amblyopia Neg Hx    Blindness Neg Hx    Cataracts Neg Hx    Glaucoma Neg Hx    Retinal detachment Neg Hx    Strabismus Neg Hx    Retinitis pigmentosa Neg Hx       Prior to Admission medications   Medication Sig Start Date End Date Taking? Authorizing Provider  albuterol (VENTOLIN HFA) 108 (90 Base) MCG/ACT inhaler TAKE 2 PUFFS BY MOUTH EVERY 6 HOURS AS NEEDED FOR WHEEZE OR SHORTNESS OF BREATH 04/08/22   Freddi Starr, MD  Alcohol Swabs (DROPSAFE ALCOHOL PREP) 70 % PADS USE AS DIRECTED  AS NEEDED. 08/15/21   Midge Minium, MD  aspirin EC 81 MG tablet Take 81 mg by mouth daily.    [provider]   atorvastatin (LIPITOR) 20 MG tablet TAKE 1 TABLET BY MOUTH EVERY DAY 03/30/22   Midge Minium, MD  Blood Glucose Monitoring Suppl (TRUE METRIX METER) w/Device KIT USE AS DIRECTED 11/11/20   Midge Minium, MD  calcium-vitamin D (OSCAL WITH D) 500-5 MG-MCG tablet Take  1 tablet by mouth.    [provider]  cholecalciferol (VITAMIN D) 25 MCG (1000 UNIT) tablet Take 1,000 Units by mouth daily.    [provider]  clonazePAM (KLONOPIN) 1 MG tablet TAKE 1 TABLET AT BEDTIME FOR RESTLESS LEGS SYNDROME 05/21/22   Midge Minium, MD  Continuous Blood Gluc Receiver (DEXCOM G6 RECEIVER) DEVI Use as directed w/ G6 sensor 05/05/22   Midge Minium, MD  Continuous Blood Gluc Sensor (DEXCOM G6 SENSOR) MISC Apply sensor every 10 days for continuous blood sugar readings 05/05/22   Midge Minium, MD  Continuous Blood Gluc Transmit (DEXCOM G6 TRANSMITTER) MISC Use as directed w/ G6 sensor 05/05/22   Midge Minium, MD  diclofenac Sodium (VOLTAREN) 1 % GEL APPLY 2 GRAMS TO AFFECTED AREA 4 TIMES A DAY Patient taking differently: Apply 1 Application topically 4 (four) times daily as needed (pain). 06/06/20   Midge Minium, MD  fenofibrate 160 MG tablet TAKE 1 TABLET EVERY DAY 03/18/22   Midge Minium, MD  furosemide (LASIX) 20 MG tablet TAKE 1 TABLET EVERY DAY 01/21/22   Midge Minium, MD  gabapentin (NEURONTIN) 300 MG capsule Take 3 capsules (900 mg total) by mouth 3 (three) times daily. 12/16/21   Midge Minium, MD  glucose blood (TRUE METRIX BLOOD GLUCOSE TEST) test strip Use as instructed to test blood sugar 4 times daily. Dx. E11.9 08/14/19   Midge Minium, MD  insulin aspart (NOVOLOG FLEXPEN) 100 UNIT/ML FlexPen INJECT 25 UNITS SUBCUTANEOUSLY THREE TIMES DAILY WITH MEALS 06/12/22   Midge Minium, MD  insulin glargine (LANTUS SOLOSTAR) 100 UNIT/ML Solostar Pen INJECT 60 UNITS UNDER THE SKIN AT BEDTIME 06/12/22   Midge Minium, MD  Insulin  Pen Needle (DROPLET PEN NEEDLES) 31G X 8 MM MISC USE 4 TIMES DAILY 06/24/21   Midge Minium, MD  Lancets Super Thin 28G MISC Please use as directed to test sugars 4 times daily. Dx. E11.9 08/14/19   Midge Minium, MD  lansoprazole (PREVACID) 15 MG capsule Take 15 mg by mouth daily.    [provider]  levothyroxine (SYNTHROID) 50 MCG tablet TAKE 1 TABLET BY MOUTH EVERY DAY BEFORE BREAKFAST 04/17/22   Midge Minium, MD  loperamide (IMODIUM A-D) 2 MG tablet Take 2 mg by mouth 4 (four) times daily as needed for diarrhea or loose stools.    [provider]  neomycin-bacitracin-polymyxin [phone] OINT Apply 1 Application topically daily.    [provider]  primidone (MYSOLINE) 50 MG tablet TAKE  2   TABLETS IN THE AM  AND TAKE 1 IN THE PM  BY MOUTH TWICE A DAY 06/05/22   Tat, Eustace Quail, DO  traZODone (DESYREL) 100 MG tablet TAKE 1 TABLET AT BEDTIME 11/16/21   Midge Minium, MD  valsartan (DIOVAN) 320 MG tablet TAKE 1 TABLET EVERY DAY 04/23/21   Midge Minium, MD  verapamil (CALAN-SR) 120 MG CR tablet TAKE 1 TABLET BY MOUTH EVERYDAY AT BEDTIME 07/02/22   Midge Minium, MD  Vibegron (GEMTESA) 75 MG TABS Take 1 tablet by mouth daily.    [provider]    Physical Exam: Vitals:   07/11/22 1145 07/11/22 1200 07/11/22 1230 07/11/22 1245  BP: (!) 172/56 (!) 154/34 (!) 155/126 (!) 145/101  Pulse: 60 (!) 59 65 (!) 59  Resp: '16 16  16  '$ Temp: 97.6 F (36.4 C)     TempSrc:      SpO2: 100%  99% 99% 99%  Weight:      Height:        Constitutional: NAD, calm, comfortable Eyes: PERRL, lids and conjunctivae normal ENMT: Mucous membranes are moist. Posterior pharynx clear of any exudate or lesions.Normal dentition.  Neck: normal, supple, no masses, no thyromegaly Respiratory: Mild scattered wheezing to auscultation bilaterally, no crackles. Normal respiratory effort. No accessory muscle use.  Cardiovascular: Regular rate and rhythm, no  murmurs / rubs / gallops. No extremity edema. 2+ pedal pulses. No carotid bruits.  Abdomen: no tenderness, no masses palpated. No hepatosplenomegaly. Bowel sounds positive.  Musculoskeletal: no clubbing / cyanosis.  Right shoulder fracture/dislocation abnormality, noted to be in a splint.   Neurologic: CN 2-12 grossly intact. Sensation intact, DTR normal. Strength 5/5 in all 4.  Psychiatric: Normal judgment and insight. Alert and oriented x 3. Normal mood.  SKIN/catheters: no rashes, lesions, ulcers. No induration  Labs on Admission: I have personally reviewed following labs and imaging studies  CBC: Recent Labs  Lab 07/11/22 0841  WBC 13.4*  NEUTROABS 11.6*  HGB 13.4  HCT 41.0  MCV 97.4  PLT [id number]   Basic Metabolic Panel: Recent Labs  Lab 07/11/22 0841  NA 139  K 4.2  CL 106  CO2 24  GLUCOSE 154*  BUN 10  CREATININE 0.83  CALCIUM 8.6*   GFR: Estimated Creatinine Clearance: 69.4 mL/min (by C-G formula based on SCr of 0.83 mg/dL). Recent Labs  Lab 07/11/22 0841  WBC 13.4*   Liver Function Tests: No results for input(s): "AST", "ALT", "ALKPHOS", "BILITOT", "PROT", "ALBUMIN" in the last 168 hours. No results for input(s): "LIPASE", "AMYLASE" in the last 168 hours. No results for input(s): "AMMONIA" in the last 168 hours. Coagulation Profile: No results for input(s): "INR", "PROTIME" in the last 168 hours. Cardiac Enzymes: No results for input(s): "CKTOTAL", "CKMB", "CKMBINDEX", "TROPONINI" in the last 168 hours. BNP (last 3 results) No results for input(s): "PROBNP" in the last 8760 hours. HbA1C: No results for input(s): "HGBA1C" in the last 72 hours. CBG: Recent Labs  Lab 07/11/22 1226  GLUCAP 147*   Lipid Profile: No results for input(s): "CHOL", "HDL", "LDLCALC", "TRIG", "CHOLHDL", "LDLDIRECT" in the last 72 hours. Thyroid Function Tests: No results for input(s): "TSH", "T4TOTAL", "FREET4", "T3FREE", "THYROIDAB" in the last 72 hours. Anemia Panel: No  results for input(s): "VITAMINB12", "FOLATE", "FERRITIN", "TIBC", "IRON", "RETICCTPCT" in the last 72 hours. Urine analysis:    Component Value Date/Time   COLORURINE YELLOW 07/08/2016 1520   APPEARANCEUR HAZY (A) 07/08/2016 1520   LABSPEC 1.017 07/08/2016 1520   PHURINE 6.0 07/08/2016 1520   GLUCOSEU >=500 (A) 07/08/2016 1520   HGBUR MODERATE (A) 07/08/2016 1520   HGBUR trace-lysed 05/30/2009 1513   BILIRUBINUR negative 04/20/2019 1335   KETONESUR NEGATIVE 07/08/2016 1520   PROTEINUR Negative 10/24/2021 1141   PROTEINUR 30 (A) 07/08/2016 1520   UROBILINOGEN 0.2 10/24/2021 1141   UROBILINOGEN 0.2 05/30/2009 1513   NITRITE positive 04/20/2019 1335   NITRITE POSITIVE (A) 07/08/2016 1520   LEUKOCYTESUR Negative 10/24/2021 1141    Radiological Exams on Admission: Personally reviewed  DG Chest Portable 1 View  Result Date: 07/11/2022 CLINICAL DATA:  Preoperative evaluation for shoulder surgery EXAM: PORTABLE CHEST 1 VIEW COMPARISON:  Chest radiograph dated 01/15/2014 FINDINGS: Normal lung volumes. No focal consolidations. No pleural effusion or pneumothorax. The heart size and mediastinal contours are within normal limits. Comminuted right proximal humeral fracture, better evaluated on prior CT. Cervical spinal fixation hardware appears intact. Clips in the  gastroesophageal region. IMPRESSION: 1. No active pulmonary disease. 2. Comminuted right proximal humeral fracture, better evaluated on prior CT. Electronically Signed   By: Darrin Nipper M.D.   On: 07/11/2022 12:43   CT Shoulder Right Wo Contrast  Result Date: 07/11/2022 CLINICAL DATA:  Anterior dislocation of the shoulder EXAM: CT OF THE UPPER RIGHT EXTREMITY WITHOUT CONTRAST TECHNIQUE: Multidetector CT imaging of the upper right extremity was performed according to the standard protocol. RADIATION DOSE REDUCTION: This exam was performed according to the departmental dose-optimization program which includes automated exposure control,  adjustment of the mA and/or kV according to patient size and/or use of iterative reconstruction technique. COMPARISON:  Shoulder radiograph dated 07/11/2022 FINDINGS: Bones/Joint/Cartilage Comminuted fracture of the proximal humerus with inferomedial dislocation of the humeral head, which rests against the subscapularis muscle. The humeral diaphysis is superiorly subluxed into the glenoid fossa. The glenoid appears intact. The acromioclavicular joint is preserved with degenerative changes. Ligaments Suboptimally assessed by CT. Muscles and Tendons Grossly intact. Small volume hematoma within the rotator cuff muscles. Soft tissues Soft tissue stranding in the axillary region. IMPRESSION: Comminuted fracture of the proximal humerus with inferomedial dislocation of the humeral head, which rests against the subscapularis muscle. The humeral diaphysis is superiorly subluxed into the glenoid fossa. Electronically Signed   By: Darrin Nipper M.D.   On: 07/11/2022 11:59   DG Humerus Right  Result Date: 07/11/2022 CLINICAL DATA:  Fall with RIGHT arm pain. EXAM: RIGHT HUMERUS - 2+ VIEW COMPARISON:  None Available. FINDINGS: Anterior-inferior dislocation of the humeral head is noted. Fracture of the humeral neck/head is noted with greater tuberosity fracture fragment displaced laterally by approximately 2.5 cm. There is minimal irregularity of the inferior glenoid and a glenoid fracture is not excluded. The remainder of the humerus is unremarkable. IMPRESSION: 1. Anterior-inferior dislocation of the humeral head with humeral neck/head fracture and displaced greater tuberosity fracture fragment. 2. Equivocal glenoid fracture. Electronically Signed   By: Margarette Canada M.D.   On: 07/11/2022 08:33   DG Shoulder Right  Result Date: 07/11/2022 CLINICAL DATA:  Acute RIGHT shoulder pain following fall. Initial encounter. EXAM: RIGHT SHOULDER - 3 VIEW COMPARISON:  None Available. FINDINGS: Anterior-inferior dislocation of the humeral  head is noted. Fracture of the humeral neck/head is noted with greater tuberosity fracture fragment displaced laterally by approximately 2.5 cm. There is minimal irregularity of the inferior glenoid and a glenoid fracture is not excluded. IMPRESSION: 1. Anterior-inferior dislocation of the humeral head with humeral neck/head fracture and displaced greater tuberosity fracture fragment. 2. Equivocal inferior glenoid fracture. Electronically Signed   By: Margarette Canada M.D.   On: 07/11/2022 08:31    EKG: Independently reviewed.  Normal sinus rhythm, borderline repolarization abnormality, QTc 398 ms     Assessment and Plan:   Principal Problem:   Closed 3-part fracture of proximal end of right humerus Active Problems:   Hyperlipidemia associated with type 2 diabetes mellitus (HCC)   Benign hypertensive heart disease with heart failure (HCC)   DIASTOLIC HEART FAILURE, CHRONIC   Atrial tachycardia   Tobacco abuse   OSA (obstructive sleep apnea)   Severe obesity (BMI >= 40) (HCC)   Tremor   Essential (primary) hypertension   Restless leg syndrome   Shoulder dislocation    1.Mechanical fall resulting in proximal right humeral fracture/dislocation: Seen by orthopedics and reduced in the ED.  Now in a splint.  Plan for OR intervention in a.m. supportive management with analgesics, insulin adjustment while n.p.o. after midnight.  2. CAD, chronic diastolic CHF: Patient did have positive exercise stress test in July last year following which she underwent a cardiac cath in August 2023 with only mild nonocclusive CAD.  Patient was recommended medical management.  Reports quite functional at baseline with no exercise intolerance.  Will resume home medications including aspirin, beta-blockers, chronic diuretics.  Patient is medically optimized for surgery.  3.  Hypertension: Resume home medications and as needed medications available.  Blood pressure currently elevated likely secondary to pain.  4.   Severe obesity/OSA: Resume home management.  Aggressive pulmonary toilet postoperatively with incentive spirometry.  Nebs as needed available.  Follow-up PCP regarding long-term weight loss recommendations  5.  Tobacco use: Patient states she had cut down on her smoking but lately back to smoking half pack per day to manage stress.  Patient counseled on risk of heavy tobacco use with underlying CAD/CHF/OSA.  Currently has mild wheezing, will give 1 neb treatment now followed by as needed.  Incentive spirometry in perioperative period.  6.  Diabetes mellitus: Resume home insulin regimen but at lower dose while at the hospital due to stricter diet regimen and also patient to be n.p.o. after midnight.  Can adjust per needs postoperatively.  7.  Restless leg syndrome/tremors: Resume home medications  8.  Hyperlipidemia: Resume home medications  DVT prophylaxis: SCDs for now, chemoprophylaxis can be started in a.m. if prolonged hospitalization anticipated  Code Status: Full code as confirmed by patient.Health care proxy would be her husband or daughter Otila Kluver  Patient/Family Communication: Discussed with patient and daughter, Otila Kluver at bedside-all questions answered to satisfaction.  Consults called: Orthopedics Admission status :Patient will be admitted under OBSERVATION status.The patient's presenting symptoms, physical exam findings, and initial radiographic and laboratory data in the context of their medical condition is felt to place them at low risk for further clinical deterioration. Furthermore, it is anticipated that the patient will be medically stable for discharge from the hospital within 2 midnights of hospital stay.       Guilford Shi MD [facility] Pager in [place]  If 7PM-7AM, please contact night-coverage [email or web address]   07/11/2022, 2:51 PM

## 2022-07-11 NOTE — ED Provider Notes (Signed)
[facility] Provider Note   CSN: [id number] Arrival date & time: 07/11/22  0719     History  Chief Complaint  Patient presents with   Lytle Michaels    Lacey [name] is a 72 y.o. female.  Patient presents to the emergency department for evaluation of acute onset right upper arm pain.  She has a history of diabetes, emphysema, HFpEF -- she slipped on a wet surface and landed directly onto the arm.  She says that she was not reaching out.  Currently she denies head or neck injury, pain in the head or neck.  No chest pain, abdominal pain, hip pain.  No lower extremity symptoms.  EMS was called for transport.  They placed patient's right upper extremity in a sling.  200 mcg of fentanyl administered.       Home Medications Prior to Admission medications   Medication Sig Start Date End Date Taking? Authorizing Provider  albuterol (VENTOLIN HFA) 108 (90 Base) MCG/ACT inhaler TAKE 2 PUFFS BY MOUTH EVERY 6 HOURS AS NEEDED FOR WHEEZE OR SHORTNESS OF BREATH 04/08/22   Freddi Starr, MD  Alcohol Swabs (DROPSAFE ALCOHOL PREP) 70 % PADS USE AS DIRECTED  AS NEEDED. 08/15/21   Midge Minium, MD  aspirin EC 81 MG tablet Take 81 mg by mouth daily.    [provider]  atorvastatin (LIPITOR) 20 MG tablet TAKE 1 TABLET BY MOUTH EVERY DAY 03/30/22   Midge Minium, MD  Blood Glucose Monitoring Suppl (TRUE METRIX METER) w/Device KIT USE AS DIRECTED 11/11/20   Midge Minium, MD  calcium-vitamin D (OSCAL WITH D) 500-5 MG-MCG tablet Take 1 tablet by mouth.    [provider]  cholecalciferol (VITAMIN D) 25 MCG (1000 UNIT) tablet Take 1,000 Units by mouth daily.    [provider]  clonazePAM (KLONOPIN) 1 MG tablet TAKE 1 TABLET AT BEDTIME FOR RESTLESS LEGS SYNDROME 05/21/22   Midge Minium, MD  Continuous Blood Gluc Receiver (DEXCOM G6 RECEIVER) DEVI Use as directed w/ G6 sensor 05/05/22   Midge Minium, MD  Continuous  Blood Gluc Sensor (DEXCOM G6 SENSOR) MISC Apply sensor every 10 days for continuous blood sugar readings 05/05/22   Midge Minium, MD  Continuous Blood Gluc Transmit (DEXCOM G6 TRANSMITTER) MISC Use as directed w/ G6 sensor 05/05/22   Midge Minium, MD  diclofenac Sodium (VOLTAREN) 1 % GEL APPLY 2 GRAMS TO AFFECTED AREA 4 TIMES A DAY Patient taking differently: Apply 1 Application topically 4 (four) times daily as needed (pain). 06/06/20   Midge Minium, MD  fenofibrate 160 MG tablet TAKE 1 TABLET EVERY DAY 03/18/22   Midge Minium, MD  furosemide (LASIX) 20 MG tablet TAKE 1 TABLET EVERY DAY 01/21/22   Midge Minium, MD  gabapentin (NEURONTIN) 300 MG capsule Take 3 capsules (900 mg total) by mouth 3 (three) times daily. 12/16/21   Midge Minium, MD  glucose blood (TRUE METRIX BLOOD GLUCOSE TEST) test strip Use as instructed to test blood sugar 4 times daily. Dx. E11.9 08/14/19   Midge Minium, MD  insulin aspart (NOVOLOG FLEXPEN) 100 UNIT/ML FlexPen INJECT 25 UNITS SUBCUTANEOUSLY THREE TIMES DAILY WITH MEALS 06/12/22   Midge Minium, MD  insulin glargine (LANTUS SOLOSTAR) 100 UNIT/ML Solostar Pen INJECT 60 UNITS UNDER THE SKIN AT BEDTIME 06/12/22   Midge Minium, MD  Insulin Pen Needle (DROPLET PEN NEEDLES) 31G X 8 MM MISC USE 4  TIMES DAILY 06/24/21   Midge Minium, MD  Lancets Super Thin 28G MISC Please use as directed to test sugars 4 times daily. Dx. E11.9 08/14/19   Midge Minium, MD  lansoprazole (PREVACID) 15 MG capsule Take 15 mg by mouth daily.    [provider]  levothyroxine (SYNTHROID) 50 MCG tablet TAKE 1 TABLET BY MOUTH EVERY DAY BEFORE BREAKFAST 04/17/22   Midge Minium, MD  loperamide (IMODIUM A-D) 2 MG tablet Take 2 mg by mouth 4 (four) times daily as needed for diarrhea or loose stools.    [provider]  neomycin-bacitracin-polymyxin [phone] OINT Apply 1 Application topically daily.    [provider]  primidone (MYSOLINE) 50 MG tablet TAKE  2   TABLETS IN THE AM  AND TAKE 1 IN THE PM  BY MOUTH TWICE A DAY 06/05/22   Tat, Eustace Quail, DO  traZODone (DESYREL) 100 MG tablet TAKE 1 TABLET AT BEDTIME 11/16/21   Midge Minium, MD  valsartan (DIOVAN) 320 MG tablet TAKE 1 TABLET EVERY DAY 04/23/21   Midge Minium, MD  verapamil (CALAN-SR) 120 MG CR tablet TAKE 1 TABLET BY MOUTH EVERYDAY AT BEDTIME 07/02/22   Midge Minium, MD  Vibegron (GEMTESA) 75 MG TABS Take 1 tablet by mouth daily.    [provider]      Allergies    Patient has no known allergies.    Review of Systems   Review of Systems  Physical Exam Updated Vital Signs BP (!) 171/62 (BP Location: Left Arm)   Pulse 65   Temp (!) 97.5 F (36.4 C) (Oral)   Resp 16   Ht '5\' 2"'$  (1.575 m)   Wt 104.3 kg   LMP 06/06/1993   SpO2 95%   BMI 42.07 kg/m  Physical Exam Vitals and nursing note reviewed.  Constitutional:      General: She is in acute distress.     Appearance: She is well-developed.     Comments: Uncomfortable appearing  HENT:     Head: Normocephalic and atraumatic.     Right Ear: External ear normal.     Left Ear: External ear normal.     Nose: Nose normal.  Eyes:     Conjunctiva/sclera: Conjunctivae normal.  Cardiovascular:     Rate and Rhythm: Normal rate and regular rhythm.     Pulses:          Radial pulses are 2+ on the right side and 2+ on the left side.     Heart sounds: No murmur heard. Pulmonary:     Effort: No respiratory distress.     Breath sounds: No wheezing, rhonchi or rales.  Abdominal:     Palpations: Abdomen is soft.     Tenderness: There is no abdominal tenderness. There is no guarding or rebound.  Musculoskeletal:     Right shoulder: Tenderness present. Decreased range of motion.     Right upper arm: Tenderness present.     Right elbow: No tenderness.     Right forearm: No tenderness.     Right wrist: No tenderness.     Cervical back: Normal range of  motion and neck supple. No tenderness. Normal range of motion.     Thoracic back: No tenderness. Normal range of motion.     Right lower leg: No edema.     Left lower leg: No edema.     Comments: Right upper extremity is neurovascularly intact with distal circulation, motor, sensation  intact.  Patient with severe tenderness over the right shoulder and upper arm area making evaluation of range of motion of the right upper extremity impossible.  Skin:    General: Skin is warm and dry.     Findings: No rash.  Neurological:     General: No focal deficit present.     Mental Status: She is alert. Mental status is at baseline.     Motor: No weakness.  Psychiatric:        Mood and Affect: Mood normal.     ED Results / Procedures / Treatments   Labs (all labs ordered are listed, but only abnormal results are displayed) Labs Reviewed - No data to display  EKG None  Radiology No results found.  Procedures Reduction of dislocation  Date/Time: 07/11/2022 10:15 AM  Performed by: Carlisle Cater, PA-C Authorized by: Carlisle Cater, PA-C  Consent: Verbal consent obtained. Written consent obtained. Consent given by: patient Patient identity confirmed: verbally with patient, hospital-assigned identification number and anonymous protocol, patient vented/unresponsive Time out: Immediately prior to procedure a "time out" was called to verify the correct patient, procedure, equipment, support staff and site/side marked as required. Local anesthesia used: no  Anesthesia: Local anesthesia used: no  Sedation: Patient sedated: yes Sedatives: see MAR for details  Patient tolerance: patient tolerated the procedure well with no immediate complications Comments: Dr. Sherry Ruffing at bedside for sedation.        Medications Ordered in ED Medications - No data to display  ED Course/ Medical Decision Making/ A&P    Patient seen and examined. History obtained directly from patient.   Labs/EKG:  None ordered.  Imaging: Ordered x-ray of the humerus and shoulder..  Medications/Fluids: Ordered: Dilaudid, Zofran.   Most recent vital signs reviewed and are as follows: BP (!) 171/62 (BP Location: Left Arm)   Pulse 65   Temp (!) 97.5 F (36.4 C) (Oral)   Resp 16   Ht '5\' 2"'$  (1.575 m)   Wt 104.3 kg   LMP 06/06/1993   SpO2 95%   BMI 42.07 kg/m   Initial impression: Right shoulder pain and injury after a mechanical fall   \ Reassessment performed. Patient appears very uncomfortable.  Additional pain medication ordered.  Labs personally reviewed and interpreted including: CBC, BMP, EKG  Imaging personally visualized and interpreted including: X-ray of the shoulder and humerus demonstrates fracture dislocation  Reviewed pertinent lab work and imaging with patient at bedside. Questions answered.   Plan: I consulted with Dr. Sammuel Hines of orthopedics.  Agrees with attempt at reduction in the emergency room.  Agrees with post procedural CT to further evaluate fracture.  Patient was discussed with and seen by Dr. Sherry Ruffing. Risks and benefits of moderate sedation discussed with patient and family at bedside.    10:45 AM Reassessment performed. Patient appears improved.  States that pain is better after reduction.  Awaiting CT of the shoulder.  Patient splinted.  CMS continues to be intact distally in the right upper extremity.  Labs personally reviewed and interpreted including: CBC with elevated white blood cell count likely stress reaction otherwise unremarkable; BMP glucose minimally elevated at 154 with normal anion gap and bicarb.  CT ordered.  Reviewed pertinent lab work and imaging with patient at bedside. Questions answered.   Most current vital signs reviewed and are as follows: BP (!) 151/55   Pulse 63   Temp (!) 97.5 F (36.4 C) (Oral)   Resp 18   Ht '5\' 2"'$  (1.575 m)  Wt 104.3 kg   LMP 06/06/1993   SpO2 97%   BMI 42.07 kg/m   Plan: Pending CT imaging.  Patient  appears to be much more comfortable now.  12:53 PM I consulted with Dr. Earnest Conroy, [facility], for admission.                            Medical Decision Making Amount and/or Complexity of Data Reviewed Labs: ordered. Radiology: ordered.  Risk Prescription drug management. Decision regarding hospitalization.   Patient with mechanical fall, fracture dislocation of the right shoulder which will require admission to the hospital.  She has multiple comorbidities which appear to be compensated for at this time.  Orthopedic consultation obtained, plan for surgery tomorrow.        Final Clinical Impression(s) / ED Diagnoses Final diagnoses:  Closed displaced comminuted fracture of shaft of right humerus, initial encounter  Dislocation of right shoulder joint, initial encounter    Rx / DC Orders ED Discharge Orders     None         Carlisle Cater, PA-C 07/11/22 1255    Tegeler, Gwenyth Allegra, MD 07/12/22 [phone]

## 2022-07-11 NOTE — Sedation Documentation (Signed)
Pt being bagged by RT. Tegeler and Josh PA attempting to relocate shoulder

## 2022-07-11 NOTE — ED Notes (Signed)
ED TO INPATIENT HANDOFF REPORT  ED Nurse Name and Phone #: [phone]  S Name/Age/Gender Lacey [name] 72 y.o. female Room/Bed: OTFC/OTF  Code Status   Code Status: Full Code  Home/SNF/Other Home Patient oriented to: self, place, time, and situation Is this baseline? Yes   Triage Complete: Triage complete  Chief Complaint Shoulder dislocation [S43.006A]  Triage Note Pt BIBA from home. Pt had a mechanical fall, slipping on wet ramp. Pt c/o right upper arm pain. Pt received 200 mcg fentanyl en route.   No LOC, no thinners. Did not hit head.   Allergies No Known Allergies  Level of Care/Admitting Diagnosis ED Disposition     ED Disposition  Admit   Condition  --   Comment  Hospital Area: [facility] [100100]  Level of Care: Telemetry Medical [104]  May place patient in observation at [facility] or [facility] if equivalent level of care is available:: No  Covid Evaluation: Asymptomatic - no recent exposure (last 10 days) testing not required  Diagnosis: Shoulder dislocation [205212]  Admitting Physician: Guilford Shi [id number]  Attending Physician: Guilford Shi [id number]          B Medical/Surgery History Past Medical History:  Diagnosis Date   Adenomatous colon polyp    hyperplastic   Anemia    Anginal pain ([place])    not recent   Arthritis    NECK, KNEES, FINGERS, TOES   Atrial tachycardia CARDIOLOGIST - DR Caryl Comes (LAST VISIT AUG 2012)   Echo 12/11: EF 55-60%, Mild LVH, grade 1 diast dysfxn;   holter 1/12: ATach   Atypical chest pain    a. 07/2004 Cath: Clean cors;  b. 04/2010 Myoview: EF 63%, no ischemia   Blood transfusion without reported diagnosis 1969   Chronic kidney disease    left kidney small    Diabetes mellitus, type 2 (HCC)    ORAL AND INSULIN MEDS (LAST A1C  7.3)   Diverticulosis    Erythema CURRENT-- CLOSED ABD. WALL ABSCESS   PER PCP NOTE (03-31-11)- MRSA-- TAKES DOXYCYCLINE   GERD (gastroesophageal  reflux disease)    CONTROLLED W/ PREVACID   History of kidney stones 2012   Hyperlipidemia    Hypertension    Insomnia    TAKES MEDS PRN   Neuropathy, peripheral    Obesity (BMI 30-39.9) 02/19/2015   Pneumonia    as child   Post op infection 11/07/12   left bunionectomy   Restless leg syndrome    Sepsis, urinary HISTORY - 2004   Sleep apnea    no cpap    Thyroid disease    Tremor    Vitamin D deficiency    Past Surgical History:  Procedure Laterality Date   ABDOMINAL HYSTERECTOMY  1995   ovaries remain   BUNIONECTOMY Left 10/24/2012   BUNIONECTOMY Right 05/17/2017   CARDIAC CATHETERIZATION  07/10/2004   CARPAL TUNNEL RELEASE  2000   RIGHT   CATARACT EXTRACTION, BILATERAL     CERVICAL FUSION  10/12/2007   C5 - 7   COLONOSCOPY     CYSTOSCOPY WITH RETROGRADE PYELOGRAM, URETEROSCOPY AND STENT PLACEMENT Left 11/28/2012   Procedure: LEFT RETROGRADE PYELOGRAM, LEFT URETEROSCOPY, ;  Surgeon: Claybon Jabs, MD;  Location: [facility];  Service: Urology;  Laterality: Left;   CYSTOSCOPY/RETROGRADE/URETEROSCOPY/STONE EXTRACTION WITH BASKET  X2 2004 & 2009   LEFT    GASTRIC BYPASS  1981   KNEE ARTHROSCOPY  12/2010   LEFT   LAPAROSCOPIC CHOLECYSTECTOMY  2001  LEFT HEART CATH AND CORONARY ANGIOGRAPHY N/A 12/03/2021   Procedure: LEFT HEART CATH AND CORONARY ANGIOGRAPHY;  Surgeon: Jettie Booze, MD;  Location: [facility];  Service: Cardiovascular;  Laterality: N/A;   left thumb joint surgery  2013   PERCUTANEOUS NEPHROSTOLITHOTOMY  02/27/2011   LEFT   POLYPECTOMY     RIGHT THUMB JOINT SURG.  09/2009   TRIGGER FINGER RELEASE  2010   RIGHT THUMB   UPPER GASTROINTESTINAL ENDOSCOPY     URETERAL STENT PLACEMENT  X2  2004  &  2009   LEFT   URETEROSCOPY  04/06/2011   Procedure: URETEROSCOPY;  Surgeon: Claybon Jabs, MD;  Location: [facility];  Service: Urology;  Laterality: Left;  L Ureteroscopy Laser Litho & Stent      A IV Location/Drains/Wounds Patient  Lines/Drains/Airways Status     Active Line/Drains/Airways     Name Placement date Placement time Site Days   Peripheral IV 07/11/22 20 G Left Antecubital 07/11/22  --  Antecubital  less than 1   Ureteral Drain/Stent Left ureter 5 Fr. 04/06/11  1030  Left ureter  4114   Incision 04/06/11 Perineum Left 04/06/11  1001  -- 4114   Incision 11/28/12 Vagina Other (Comment) 11/28/12  0754  -- 3512            Intake/Output Last 24 hours No intake or output data in the 24 hours ending 07/11/22 1525  Labs/Imaging Results for orders placed or performed during the hospital encounter of 07/11/22 (from the past 48 hour(s))  CBC with Differential     Status: Abnormal   Collection Time: 07/11/22  8:41 AM  Result Value Ref Range   WBC 13.4 (H) 4.0 - 10.5 K/uL   RBC 4.21 3.87 - 5.11 MIL/uL   Hemoglobin 13.4 12.0 - 15.0 g/dL   HCT 41.0 36.0 - 46.0 %   MCV 97.4 80.0 - 100.0 fL   MCH 31.8 26.0 - 34.0 pg   MCHC 32.7 30.0 - 36.0 g/dL   RDW 13.3 11.5 - 15.5 %   Platelets 170 150 - 400 K/uL   nRBC 0.0 0.0 - 0.2 %   Neutrophils Relative % 85 %   Neutro Abs 11.6 (H) 1.7 - 7.7 K/uL   Lymphocytes Relative 8 %   Lymphs Abs 1.0 0.7 - 4.0 K/uL   Monocytes Relative 5 %   Monocytes Absolute 0.6 0.1 - 1.0 K/uL   Eosinophils Relative 0 %   Eosinophils Absolute 0.0 0.0 - 0.5 K/uL   Basophils Relative 1 %   Basophils Absolute 0.1 0.0 - 0.1 K/uL   Immature Granulocytes 1 %   Abs Immature Granulocytes 0.07 0.00 - 0.07 K/uL    Comment: Performed at [facility], [address] [place], [place] [id number]  Basic metabolic panel     Status: Abnormal   Collection Time: 07/11/22  8:41 AM  Result Value Ref Range   Sodium 139 135 - 145 mmol/L   Potassium 4.2 3.5 - 5.1 mmol/L   Chloride 106 98 - 111 mmol/L   CO2 24 22 - 32 mmol/L   Glucose, Bld 154 (H) 70 - 99 mg/dL    Comment: Glucose reference range applies only to samples taken after fasting for at least 8 hours.   BUN 10 8 - 23 mg/dL   Creatinine,  Ser 0.83 0.44 - 1.00 mg/dL   Calcium 8.6 (L) 8.9 - 10.3 mg/dL   GFR, Estimated >60 >60 mL/min  Comment: (NOTE) Calculated using the CKD-EPI Creatinine Equation (2021)    Anion gap 9 5 - 15    Comment: Performed at [facility], [place] [address] [place], [place] [address]  CBG monitoring, ED     Status: Abnormal   Collection Time: 07/11/22 12:26 PM  Result Value Ref Range   Glucose-Capillary 147 (H) 70 - 99 mg/dL    Comment: Glucose reference range applies only to samples taken after fasting for at least 8 hours.   DG Chest Portable 1 View  Result Date: 07/11/2022 CLINICAL DATA:  Preoperative evaluation for shoulder surgery EXAM: PORTABLE CHEST 1 VIEW COMPARISON:  Chest radiograph dated 01/15/2014 FINDINGS: Normal lung volumes. No focal consolidations. No pleural effusion or pneumothorax. The heart size and mediastinal contours are within normal limits. Comminuted right proximal humeral fracture, better evaluated on prior CT. Cervical spinal fixation hardware appears intact. Clips in the gastroesophageal region. IMPRESSION: 1. No active pulmonary disease. 2. Comminuted right proximal humeral fracture, better evaluated on prior CT. Electronically Signed   By: Darrin Nipper M.D.   On: 07/11/2022 12:43   CT Shoulder Right Wo Contrast  Result Date: 07/11/2022 CLINICAL DATA:  Anterior dislocation of the shoulder EXAM: CT OF THE UPPER RIGHT EXTREMITY WITHOUT CONTRAST TECHNIQUE: Multidetector CT imaging of the upper right extremity was performed according to the standard protocol. RADIATION DOSE REDUCTION: This exam was performed according to the departmental dose-optimization program which includes automated exposure control, adjustment of the mA and/or kV according to patient size and/or use of iterative reconstruction technique. COMPARISON:  Shoulder radiograph dated 07/11/2022 FINDINGS: Bones/Joint/Cartilage Comminuted fracture of the proximal humerus with inferomedial dislocation of the humeral  head, which rests against the subscapularis muscle. The humeral diaphysis is superiorly subluxed into the glenoid fossa. The glenoid appears intact. The acromioclavicular joint is preserved with degenerative changes. Ligaments Suboptimally assessed by CT. Muscles and Tendons Grossly intact. Small volume hematoma within the rotator cuff muscles. Soft tissues Soft tissue stranding in the axillary region. IMPRESSION: Comminuted fracture of the proximal humerus with inferomedial dislocation of the humeral head, which rests against the subscapularis muscle. The humeral diaphysis is superiorly subluxed into the glenoid fossa. Electronically Signed   By: Darrin Nipper M.D.   On: 07/11/2022 11:59   DG Humerus Right  Result Date: 07/11/2022 CLINICAL DATA:  Fall with RIGHT arm pain. EXAM: RIGHT HUMERUS - 2+ VIEW COMPARISON:  None Available. FINDINGS: Anterior-inferior dislocation of the humeral head is noted. Fracture of the humeral neck/head is noted with greater tuberosity fracture fragment displaced laterally by approximately 2.5 cm. There is minimal irregularity of the inferior glenoid and a glenoid fracture is not excluded. The remainder of the humerus is unremarkable. IMPRESSION: 1. Anterior-inferior dislocation of the humeral head with humeral neck/head fracture and displaced greater tuberosity fracture fragment. 2. Equivocal glenoid fracture. Electronically Signed   By: Margarette Canada M.D.   On: 07/11/2022 08:33   DG Shoulder Right  Result Date: 07/11/2022 CLINICAL DATA:  Acute RIGHT shoulder pain following fall. Initial encounter. EXAM: RIGHT SHOULDER - 3 VIEW COMPARISON:  None Available. FINDINGS: Anterior-inferior dislocation of the humeral head is noted. Fracture of the humeral neck/head is noted with greater tuberosity fracture fragment displaced laterally by approximately 2.5 cm. There is minimal irregularity of the inferior glenoid and a glenoid fracture is not excluded. IMPRESSION: 1. Anterior-inferior  dislocation of the humeral head with humeral neck/head fracture and displaced greater tuberosity fracture fragment. 2. Equivocal inferior glenoid fracture. Electronically Signed  By: Margarette Canada M.D.   On: 07/11/2022 08:31    Pending Labs Unresulted Labs (From admission, onward)     Start     Ordered   07/12/22 [id number]  Basic metabolic panel  Tomorrow morning,   R        07/11/22 1442   07/12/22 0500  CBC  Tomorrow morning,   R        07/11/22 1442            Vitals/Pain Today's Vitals   07/11/22 1230 07/11/22 1245 07/11/22 1254 07/11/22 1522  BP: (!) 155/126 (!) 145/101    Pulse: 65 (!) 59    Resp:  16    Temp:      TempSrc:      SpO2: 99% 99%    Weight:      Height:      PainSc:   6  9     Isolation Precautions No active isolations  Medications Medications  aspirin EC tablet 81 mg (has no administration in time range)  atorvastatin (LIPITOR) tablet 20 mg (has no administration in time range)  fenofibrate tablet 160 mg (has no administration in time range)  furosemide (LASIX) tablet 20 mg (has no administration in time range)  irbesartan (AVAPRO) tablet 37.5 mg (has no administration in time range)  verapamil (CALAN-SR) CR tablet 120 mg (has no administration in time range)  traZODone (DESYREL) tablet 100 mg (has no administration in time range)  insulin glargine-yfgn (SEMGLEE) injection 30 Units (has no administration in time range)  levothyroxine (SYNTHROID) tablet 50 mcg (has no administration in time range)  pantoprazole (PROTONIX) EC tablet 20 mg (has no administration in time range)  mirabegron ER (MYRBETRIQ) tablet 25 mg (has no administration in time range)  clonazePAM (KLONOPIN) tablet 1 mg (has no administration in time range)  gabapentin (NEURONTIN) capsule 900 mg (has no administration in time range)  calcium-vitamin D (OSCAL WITH D) 500-5 MG-MCG per tablet 1 tablet (has no administration in time range)  ipratropium-albuterol (DUONEB) 0.5-2.5 (3) MG/3ML  nebulizer solution 3 mL (has no administration in time range)  ipratropium-albuterol (DUONEB) 0.5-2.5 (3) MG/3ML nebulizer solution 3 mL (has no administration in time range)  acetaminophen (TYLENOL) tablet 650 mg (has no administration in time range)    Or  acetaminophen (TYLENOL) suppository 650 mg (has no administration in time range)  oxyCODONE (Oxy IR/ROXICODONE) immediate release tablet 5 mg (has no administration in time range)  morphine (PF) 2 MG/ML injection 2 mg (2 mg Intravenous Given 07/11/22 1517)  ondansetron (ZOFRAN) tablet 4 mg (has no administration in time range)    Or  ondansetron (ZOFRAN) injection 4 mg (has no administration in time range)  insulin aspart (novoLOG) injection 10 Units (has no administration in time range)  cholecalciferol (VITAMIN D3) 25 MCG (1000 UNIT) tablet 1,000 Units (has no administration in time range)  insulin aspart (novoLOG) injection 0-15 Units (has no administration in time range)  hydrALAZINE (APRESOLINE) injection 10 mg (has no administration in time range)  primidone (MYSOLINE) tablet 100 mg (has no administration in time range)  primidone (MYSOLINE) tablet 50 mg (has no administration in time range)  HYDROmorphone (DILAUDID) injection 0.5 mg (0.5 mg Intravenous Given 07/11/22 0737)  ondansetron (ZOFRAN) injection 4 mg (4 mg Intravenous Given 07/11/22 0737)  HYDROmorphone (DILAUDID) injection 0.5 mg (0.5 mg Intravenous Given 07/11/22 0833)  propofol (DIPRIVAN) 10 mg/mL bolus/IV push 104.3 mg (50 mg Intravenous Given by Other 07/11/22 1017)  propofol (DIPRIVAN) 10 mg/mL  bolus/IV push (20 mg Intravenous Given 07/11/22 1019)  morphine (PF) 4 MG/ML injection 4 mg (4 mg Intravenous Given 07/11/22 1234)    Mobility walks     Focused Assessments Musculoskeletal   R Recommendations: See Admitting Provider Note  Report given to:   Additional Notes:  Pt has a Comminuted fracture of the proximal humerus with inferomedial dislocation of the humeral  head, which rests against the subscapularis muscle. She is on a purwick and she has a sling on right arm. Just gave morphine 2 mg for pain.

## 2022-07-11 NOTE — ED Notes (Signed)
Pt placed on 2L [place] after admin of dilaudid, as sats were in the mid 80s. Sats improved to 94% on 2L [place]

## 2022-07-11 NOTE — ED Provider Notes (Signed)
.  Sedation  Date/Time: 07/11/2022 10:33 AM  Performed by: Courtney Paris, MD Authorized by: Courtney Paris, MD   Consent:    Consent obtained:  Written   Consent given by:  Patient   Risks discussed:  Prolonged hypoxia resulting in organ damage, prolonged sedation necessitating reversal and respiratory compromise necessitating ventilatory assistance and intubation Universal protocol:    Immediately prior to procedure, a time out was called: yes   Indications:    Procedure performed:  Fracture reduction   Procedure necessitating sedation performed by:  Physician performing sedation Pre-sedation assessment:    Time since last food or drink:  Hours   ASA classification: class 2 - patient with mild systemic disease     Mallampati score:  I - soft palate, uvula, fauces, pillars visible   Pre-sedation assessments completed and reviewed: pain level     Pre-sedation assessments completed and reviewed: pre-procedure airway patency not reviewed, pre-procedure mental status not reviewed and pre-procedure nausea and vomiting status not reviewed   Immediate pre-procedure details:    Reassessment: Patient reassessed immediately prior to procedure   Procedure details (see MAR for exact dosages):    Preoxygenation:  Nasal cannula   Sedation:  Propofol   Intended level of sedation: deep   Intra-procedure monitoring:  Blood pressure monitoring, cardiac monitor, continuous capnometry, continuous pulse oximetry and frequent vital sign checks   Intra-procedure events: hypoxia     Intra-procedure management:  BVM ventilation   Total Provider sedation time (minutes):  20 Post-procedure details:    Attendance: Constant attendance by certified staff until patient recovered     Recovery: Patient returned to pre-procedure baseline     Patient is stable for discharge or admission: yes     Procedure completion:  Tolerated well, no immediate complications Comments:     Patient had brief episode  of hypoxia and oxygen was increased and she was bagged briefly.  She then returned to baseline and did not have further hypoxia.  This only lasted several seconds before improving.     Huong Luthi, Gwenyth Allegra, MD 07/11/22 [phone]

## 2022-07-11 NOTE — H&P (View-Only) (Signed)
ORTHOPAEDIC CONSULTATION  REQUESTING PHYSICIAN: Tegeler, Gwenyth Allegra, *  Chief Complaint: Right shoulder fracture dislocation  HPI: Marcell Escovedo is a 72 y.o. female who presents with right shoulder fracture dislocation after she slipped on a ramp this morning while walking her dog in the rain.  She does have a history of kidney disease as well as diabetes for which her most recent A1c was in the sevens.  She is right-hand dominant.  She is very active around the community.  She is here today with her daughter.  She is a caregiver for her husband who is recently been dealing with cardiac issues as well.  Reduction attempt was tried in the emergency room.  Orthopedics was consulted for definitive management  Past Medical History:  Diagnosis Date   Adenomatous colon polyp    hyperplastic   Anemia    Anginal pain ([place])    not recent   Arthritis    NECK, KNEES, FINGERS, TOES   Atrial tachycardia CARDIOLOGIST - DR Caryl Comes (LAST VISIT AUG 2012)   Echo 12/11: EF 55-60%, Mild LVH, grade 1 diast dysfxn;   holter 1/12: ATach   Atypical chest pain    a. 07/2004 Cath: Clean cors;  b. 04/2010 Myoview: EF 63%, no ischemia   Blood transfusion without reported diagnosis 1969   Chronic kidney disease    left kidney small    Diabetes mellitus, type 2 (HCC)    ORAL AND INSULIN MEDS (LAST A1C  7.3)   Diverticulosis    Erythema CURRENT-- CLOSED ABD. WALL ABSCESS   PER PCP NOTE (03-31-11)- MRSA-- TAKES DOXYCYCLINE   GERD (gastroesophageal reflux disease)    CONTROLLED W/ PREVACID   History of kidney stones 2012   Hyperlipidemia    Hypertension    Insomnia    TAKES MEDS PRN   Neuropathy, peripheral    Obesity (BMI 30-39.9) 02/19/2015   Pneumonia    as child   Post op infection 11/07/12   left bunionectomy   Restless leg syndrome    Sepsis, urinary HISTORY - 2004   Sleep apnea    no cpap    Thyroid disease    Tremor    Vitamin D deficiency    Past Surgical History:  Procedure Laterality  Date   ABDOMINAL HYSTERECTOMY  1995   ovaries remain   BUNIONECTOMY Left 10/24/2012   BUNIONECTOMY Right 05/17/2017   CARDIAC CATHETERIZATION  07/10/2004   CARPAL TUNNEL RELEASE  2000   RIGHT   CATARACT EXTRACTION, BILATERAL     CERVICAL FUSION  10/12/2007   C5 - 7   COLONOSCOPY     CYSTOSCOPY WITH RETROGRADE PYELOGRAM, URETEROSCOPY AND STENT PLACEMENT Left 11/28/2012   Procedure: LEFT RETROGRADE PYELOGRAM, LEFT URETEROSCOPY, ;  Surgeon: Claybon Jabs, MD;  Location: [facility];  Service: Urology;  Laterality: Left;   CYSTOSCOPY/RETROGRADE/URETEROSCOPY/STONE EXTRACTION WITH BASKET  X2 2004 & 2009   LEFT    GASTRIC BYPASS  1981   KNEE ARTHROSCOPY  12/2010   LEFT   LAPAROSCOPIC CHOLECYSTECTOMY  2001   LEFT HEART CATH AND CORONARY ANGIOGRAPHY N/A 12/03/2021   Procedure: LEFT HEART CATH AND CORONARY ANGIOGRAPHY;  Surgeon: Jettie Booze, MD;  Location: [facility];  Service: Cardiovascular;  Laterality: N/A;   left thumb joint surgery  2013   PERCUTANEOUS NEPHROSTOLITHOTOMY  02/27/2011   LEFT   POLYPECTOMY     RIGHT THUMB JOINT SURG.  09/2009   TRIGGER FINGER RELEASE  2010   RIGHT THUMB  UPPER GASTROINTESTINAL ENDOSCOPY     URETERAL STENT PLACEMENT  X2  2004  &  2009   LEFT   URETEROSCOPY  04/06/2011   Procedure: URETEROSCOPY;  Surgeon: Claybon Jabs, MD;  Location: [facility];  Service: Urology;  Laterality: Left;  L Ureteroscopy Laser Litho & Stent    Social History   Socioeconomic History   Marital status: Married    Spouse name: Not on file   Number of children: 2   Years of education: 12   Highest education level: High school graduate  Occupational History   Occupation: Engineer, materials middle school    Employer: [employer]    Comment: in office   Occupation: Retired  Tobacco Use   Smoking status: Every Day    Packs/day: 2.00    Years: 43.00    Total pack years: 86.00    Types: Cigarettes   Smokeless tobacco: Never   Tobacco comments:     1/2 ppd 12/29/21  Vaping Use   Vaping Use: Never used  Substance and Sexual Activity   Alcohol use: No    Alcohol/week: 0.0 standard drinks of alcohol   Drug use: No   Sexual activity: Not on file  Other Topics Concern   Not on file  Social History Narrative   2 children, 2 stepchildren   Lives with husband.   Right-handed.   No daily caffeine.   Social Determinants of Health   Financial Resource Strain: Low Risk  (01/08/2022)   Overall Financial Resource Strain (CARDIA)    Difficulty of Paying Living Expenses: Not hard at all  Food Insecurity: No Food Insecurity (01/08/2022)   Hunger Vital Sign    Worried About Running Out of Food in the Last Year: Never true    Ran Out of Food in the Last Year: Never true  Transportation Needs: No Transportation Needs (01/08/2022)   PRAPARE - Hydrologist (Medical): No    Lack of Transportation (Non-Medical): No  Physical Activity: Insufficiently Active (01/08/2022)   Exercise Vital Sign    Days of Exercise per Week: 3 days    Minutes of Exercise per Session: 30 min  Stress: No Stress Concern Present (01/08/2022)   [place]    Feeling of Stress : Not at all  Social Connections: Moderately Integrated (01/08/2022)   Social Connection and Isolation Panel [NHANES]    Frequency of Communication with Friends and Family: Three times a week    Frequency of Social Gatherings with Friends and Family: Once a week    Attends Religious Services: More than 4 times per year    Active Member of [employer] or Organizations: No    Attends Music therapist: Never    Marital Status: Married   Family History  Problem Relation Age of Onset   Hyperlipidemia Mother    Hypertension Mother    Heart attack Father    Lung disease Father    Heart disease Sister    Hypertension Sister    Colon polyps Sister    Hypertension Sister    Colon polyps Sister     Hypertension Sister    Hypertension Sister    Lung cancer Sister 15       stage 4    Diabetes Other    Breast cancer Other    Heart disease Other    Colon cancer Other 52       nephew  Esophageal cancer Neg Hx    Rectal cancer Neg Hx    Stomach cancer Neg Hx    Amblyopia Neg Hx    Blindness Neg Hx    Cataracts Neg Hx    Glaucoma Neg Hx    Retinal detachment Neg Hx    Strabismus Neg Hx    Retinitis pigmentosa Neg Hx    - negative except otherwise stated in the family history section No Known Allergies Prior to Admission medications   Medication Sig Start Date End Date Taking? Authorizing Provider  albuterol (VENTOLIN HFA) 108 (90 Base) MCG/ACT inhaler TAKE 2 PUFFS BY MOUTH EVERY 6 HOURS AS NEEDED FOR WHEEZE OR SHORTNESS OF BREATH 04/08/22   Freddi Starr, MD  Alcohol Swabs (DROPSAFE ALCOHOL PREP) 70 % PADS USE AS DIRECTED  AS NEEDED. 08/15/21   Midge Minium, MD  aspirin EC 81 MG tablet Take 81 mg by mouth daily.    [provider]  atorvastatin (LIPITOR) 20 MG tablet TAKE 1 TABLET BY MOUTH EVERY DAY 03/30/22   Midge Minium, MD  Blood Glucose Monitoring Suppl (TRUE METRIX METER) w/Device KIT USE AS DIRECTED 11/11/20   Midge Minium, MD  calcium-vitamin D (OSCAL WITH D) 500-5 MG-MCG tablet Take 1 tablet by mouth.    [provider]  cholecalciferol (VITAMIN D) 25 MCG (1000 UNIT) tablet Take 1,000 Units by mouth daily.    [provider]  clonazePAM (KLONOPIN) 1 MG tablet TAKE 1 TABLET AT BEDTIME FOR RESTLESS LEGS SYNDROME 05/21/22   Midge Minium, MD  Continuous Blood Gluc Receiver (DEXCOM G6 RECEIVER) DEVI Use as directed w/ G6 sensor 05/05/22   Midge Minium, MD  Continuous Blood Gluc Sensor (DEXCOM G6 SENSOR) MISC Apply sensor every 10 days for continuous blood sugar readings 05/05/22   Midge Minium, MD  Continuous Blood Gluc Transmit (DEXCOM G6 TRANSMITTER) MISC Use as directed w/ G6 sensor 05/05/22   Midge Minium, MD  diclofenac Sodium (VOLTAREN) 1 % GEL APPLY 2 GRAMS TO AFFECTED AREA 4 TIMES A DAY Patient taking differently: Apply 1 Application topically 4 (four) times daily as needed (pain). 06/06/20   Midge Minium, MD  fenofibrate 160 MG tablet TAKE 1 TABLET EVERY DAY 03/18/22   Midge Minium, MD  furosemide (LASIX) 20 MG tablet TAKE 1 TABLET EVERY DAY 01/21/22   Midge Minium, MD  gabapentin (NEURONTIN) 300 MG capsule Take 3 capsules (900 mg total) by mouth 3 (three) times daily. 12/16/21   Midge Minium, MD  glucose blood (TRUE METRIX BLOOD GLUCOSE TEST) test strip Use as instructed to test blood sugar 4 times daily. Dx. E11.9 08/14/19   Midge Minium, MD  insulin aspart (NOVOLOG FLEXPEN) 100 UNIT/ML FlexPen INJECT 25 UNITS SUBCUTANEOUSLY THREE TIMES DAILY WITH MEALS 06/12/22   Midge Minium, MD  insulin glargine (LANTUS SOLOSTAR) 100 UNIT/ML Solostar Pen INJECT 60 UNITS UNDER THE SKIN AT BEDTIME 06/12/22   Midge Minium, MD  Insulin Pen Needle (DROPLET PEN NEEDLES) 31G X 8 MM MISC USE 4 TIMES DAILY 06/24/21   Midge Minium, MD  Lancets Super Thin 28G MISC Please use as directed to test sugars 4 times daily. Dx. E11.9 08/14/19   Midge Minium, MD  lansoprazole (PREVACID) 15 MG capsule Take 15 mg by mouth daily.    [provider]  levothyroxine (SYNTHROID) 50 MCG tablet TAKE 1 TABLET BY MOUTH EVERY DAY BEFORE BREAKFAST 04/17/22   Annye Asa  E, MD  loperamide (IMODIUM A-D) 2 MG tablet Take 2 mg by mouth 4 (four) times daily as needed for diarrhea or loose stools.    [provider]  neomycin-bacitracin-polymyxin [phone] OINT Apply 1 Application topically daily.    [provider]  primidone (MYSOLINE) 50 MG tablet TAKE  2   TABLETS IN THE AM  AND TAKE 1 IN THE PM  BY MOUTH TWICE A DAY 06/05/22   Tat, Eustace Quail, DO  traZODone (DESYREL) 100 MG tablet TAKE 1 TABLET AT BEDTIME 11/16/21   Midge Minium, MD  valsartan  (DIOVAN) 320 MG tablet TAKE 1 TABLET EVERY DAY 04/23/21   Midge Minium, MD  verapamil (CALAN-SR) 120 MG CR tablet TAKE 1 TABLET BY MOUTH EVERYDAY AT BEDTIME 07/02/22   Midge Minium, MD  Vibegron (GEMTESA) 75 MG TABS Take 1 tablet by mouth daily.    [provider]   CT Shoulder Right Wo Contrast  Result Date: 07/11/2022 CLINICAL DATA:  Anterior dislocation of the shoulder EXAM: CT OF THE UPPER RIGHT EXTREMITY WITHOUT CONTRAST TECHNIQUE: Multidetector CT imaging of the upper right extremity was performed according to the standard protocol. RADIATION DOSE REDUCTION: This exam was performed according to the departmental dose-optimization program which includes automated exposure control, adjustment of the mA and/or kV according to patient size and/or use of iterative reconstruction technique. COMPARISON:  Shoulder radiograph dated 07/11/2022 FINDINGS: Bones/Joint/Cartilage Comminuted fracture of the proximal humerus with inferomedial dislocation of the humeral head, which rests against the subscapularis muscle. The humeral diaphysis is superiorly subluxed into the glenoid fossa. The glenoid appears intact. The acromioclavicular joint is preserved with degenerative changes. Ligaments Suboptimally assessed by CT. Muscles and Tendons Grossly intact. Small volume hematoma within the rotator cuff muscles. Soft tissues Soft tissue stranding in the axillary region. IMPRESSION: Comminuted fracture of the proximal humerus with inferomedial dislocation of the humeral head, which rests against the subscapularis muscle. The humeral diaphysis is superiorly subluxed into the glenoid fossa. Electronically Signed   By: Darrin Nipper M.D.   On: 07/11/2022 11:59   DG Humerus Right  Result Date: 07/11/2022 CLINICAL DATA:  Fall with RIGHT arm pain. EXAM: RIGHT HUMERUS - 2+ VIEW COMPARISON:  None Available. FINDINGS: Anterior-inferior dislocation of the humeral head is noted. Fracture of the humeral neck/head is  noted with greater tuberosity fracture fragment displaced laterally by approximately 2.5 cm. There is minimal irregularity of the inferior glenoid and a glenoid fracture is not excluded. The remainder of the humerus is unremarkable. IMPRESSION: 1. Anterior-inferior dislocation of the humeral head with humeral neck/head fracture and displaced greater tuberosity fracture fragment. 2. Equivocal glenoid fracture. Electronically Signed   By: Margarette Canada M.D.   On: 07/11/2022 08:33   DG Shoulder Right  Result Date: 07/11/2022 CLINICAL DATA:  Acute RIGHT shoulder pain following fall. Initial encounter. EXAM: RIGHT SHOULDER - 3 VIEW COMPARISON:  None Available. FINDINGS: Anterior-inferior dislocation of the humeral head is noted. Fracture of the humeral neck/head is noted with greater tuberosity fracture fragment displaced laterally by approximately 2.5 cm. There is minimal irregularity of the inferior glenoid and a glenoid fracture is not excluded. IMPRESSION: 1. Anterior-inferior dislocation of the humeral head with humeral neck/head fracture and displaced greater tuberosity fracture fragment. 2. Equivocal inferior glenoid fracture. Electronically Signed   By: Margarette Canada M.D.   On: 07/11/2022 08:31     Positive ROS: All other systems have been reviewed and were otherwise negative with the exception of those mentioned  in the HPI and as above.  Physical Exam: General: No acute distress Cardiovascular: No pedal edema Respiratory: No cyanosis, no use of accessory musculature GI: No organomegaly, abdomen is soft and non-tender Skin: No lesions in the area of chief complaint Neurologic: Sensation intact distally Psychiatric: Patient is at baseline mood and affect Lymphatic: No axillary or cervical lymphadenopathy  MUSCULOSKELETAL:  Right shoulder with fullness about the axilla.  She has a strong 2+ radial pulse equal to the contralateral side.  Fingers are all warm and well-perfused.  She is able to extend  at the right wrist as well as fire EPL as well as strongly grip my hand on the right.  Independent Imaging Review: CT scan right shoulder, x-rays right shoulder: There is a right shoulder fracture dislocation with displacement of the humeral head into the axilla.  Assessment: 72 year old female with a right proximal humerus fracture dislocation with displacement of the humeral head into the axillary pouch.  Given this she is currently neurologically and vascularly intact.  I do believe she will require admission to the medical service for close observation and surgery 3/10 for reverse shoulder arthroplasty.  I did describe the long-term expected function should she not elect for surgical intervention would be very poor.  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 N.p.o. at midnight in preparation presents   After a lengthy discussion of treatment options, including risks, benefits, alternatives, complications of surgical and nonsurgical conservative options, the patient elected surgical repair.   The patient  is aware of the material risks  and complications including, but not limited to injury to adjacent structures, neurovascular injury, infection, numbness, bleeding, implant failure, thermal burns, stiffness, persistent pain, failure to heal, disease transmission from allograft, need for further surgery, dislocation, anesthetic risks, blood clots, risks of death,and others. The probabilities of surgical success and failure discussed with patient given their particular co-morbidities.The time and nature of expected rehabilitation and recovery was discussed.The patient's questions were all answered preoperatively.  No barriers to understanding were noted. I  explained the natural history of the disease process and Rx rationale.  I explained to the patient what I considered to be reasonable expectations given their personal situation.  The final treatment plan was arrived at through a shared patient decision making process model.   Thank you for the consult and the opportunity to see Ms. Kathlen Mody, MD [facility] 12:26 PM

## 2022-07-11 NOTE — Consult Note (Signed)
ORTHOPAEDIC CONSULTATION  REQUESTING PHYSICIAN: Tegeler, Gwenyth Allegra, *  Chief Complaint: Right shoulder fracture dislocation  HPI: Lacey [name] is a 72 y.o. female who presents with right shoulder fracture dislocation after she slipped on a ramp this morning while walking her dog in the rain.  She does have a history of kidney disease as well as diabetes for which her most recent A1c was in the sevens.  She is right-hand dominant.  She is very active around the community.  She is here today with her daughter.  She is a caregiver for her husband who is recently been dealing with cardiac issues as well.  Reduction attempt was tried in the emergency room.  Orthopedics was consulted for definitive management  Past Medical History:  Diagnosis Date   Adenomatous colon polyp    hyperplastic   Anemia    Anginal pain ([place])    not recent   Arthritis    NECK, KNEES, FINGERS, TOES   Atrial tachycardia CARDIOLOGIST - DR Caryl Comes (LAST VISIT AUG 2012)   Echo 12/11: EF 55-60%, Mild LVH, grade 1 diast dysfxn;   holter 1/12: ATach   Atypical chest pain    a. 07/2004 Cath: Clean cors;  b. 04/2010 Myoview: EF 63%, no ischemia   Blood transfusion without reported diagnosis 1969   Chronic kidney disease    left kidney small    Diabetes mellitus, type 2 (HCC)    ORAL AND INSULIN MEDS (LAST A1C  7.3)   Diverticulosis    Erythema CURRENT-- CLOSED ABD. WALL ABSCESS   PER PCP NOTE (03-31-11)- MRSA-- TAKES DOXYCYCLINE   GERD (gastroesophageal reflux disease)    CONTROLLED W/ PREVACID   History of kidney stones 2012   Hyperlipidemia    Hypertension    Insomnia    TAKES MEDS PRN   Neuropathy, peripheral    Obesity (BMI 30-39.9) 02/19/2015   Pneumonia    as child   Post op infection 11/07/12   left bunionectomy   Restless leg syndrome    Sepsis, urinary HISTORY - 2004   Sleep apnea    no cpap    Thyroid disease    Tremor    Vitamin D deficiency    Past Surgical History:  Procedure Laterality  Date   ABDOMINAL HYSTERECTOMY  1995   ovaries remain   BUNIONECTOMY Left 10/24/2012   BUNIONECTOMY Right 05/17/2017   CARDIAC CATHETERIZATION  07/10/2004   CARPAL TUNNEL RELEASE  2000   RIGHT   CATARACT EXTRACTION, BILATERAL     CERVICAL FUSION  10/12/2007   C5 - 7   COLONOSCOPY     CYSTOSCOPY WITH RETROGRADE PYELOGRAM, URETEROSCOPY AND STENT PLACEMENT Left 11/28/2012   Procedure: LEFT RETROGRADE PYELOGRAM, LEFT URETEROSCOPY, ;  Surgeon: Claybon Jabs, MD;  Location: [facility];  Service: Urology;  Laterality: Left;   CYSTOSCOPY/RETROGRADE/URETEROSCOPY/STONE EXTRACTION WITH BASKET  X2 2004 & 2009   LEFT    GASTRIC BYPASS  1981   KNEE ARTHROSCOPY  12/2010   LEFT   LAPAROSCOPIC CHOLECYSTECTOMY  2001   LEFT HEART CATH AND CORONARY ANGIOGRAPHY N/A 12/03/2021   Procedure: LEFT HEART CATH AND CORONARY ANGIOGRAPHY;  Surgeon: Jettie Booze, MD;  Location: [facility];  Service: Cardiovascular;  Laterality: N/A;   left thumb joint surgery  2013   PERCUTANEOUS NEPHROSTOLITHOTOMY  02/27/2011   LEFT   POLYPECTOMY     RIGHT THUMB JOINT SURG.  09/2009   TRIGGER FINGER RELEASE  2010   RIGHT THUMB  UPPER GASTROINTESTINAL ENDOSCOPY     URETERAL STENT PLACEMENT  X2  2004  &  2009   LEFT   URETEROSCOPY  04/06/2011   Procedure: URETEROSCOPY;  Surgeon: Claybon Jabs, MD;  Location: [facility];  Service: Urology;  Laterality: Left;  L Ureteroscopy Laser Litho & Stent    Social History   Socioeconomic History   Marital status: Married    Spouse name: Not on file   Number of children: 2   Years of education: 12   Highest education level: High school graduate  Occupational History   Occupation: Engineer, materials middle school    Employer: [employer]    Comment: in office   Occupation: Retired  Tobacco Use   Smoking status: Every Day    Packs/day: 2.00    Years: 43.00    Total pack years: 86.00    Types: Cigarettes   Smokeless tobacco: Never   Tobacco comments:     1/2 ppd 12/29/21  Vaping Use   Vaping Use: Never used  Substance and Sexual Activity   Alcohol use: No    Alcohol/week: 0.0 standard drinks of alcohol   Drug use: No   Sexual activity: Not on file  Other Topics Concern   Not on file  Social History Narrative   2 children, 2 stepchildren   Lives with husband.   Right-handed.   No daily caffeine.   Social Determinants of Health   Financial Resource Strain: Low Risk  (01/08/2022)   Overall Financial Resource Strain (CARDIA)    Difficulty of Paying Living Expenses: Not hard at all  Food Insecurity: No Food Insecurity (01/08/2022)   Hunger Vital Sign    Worried About Running Out of Food in the Last Year: Never true    Ran Out of Food in the Last Year: Never true  Transportation Needs: No Transportation Needs (01/08/2022)   PRAPARE - Hydrologist (Medical): No    Lack of Transportation (Non-Medical): No  Physical Activity: Insufficiently Active (01/08/2022)   Exercise Vital Sign    Days of Exercise per Week: 3 days    Minutes of Exercise per Session: 30 min  Stress: No Stress Concern Present (01/08/2022)   [place]    Feeling of Stress : Not at all  Social Connections: Moderately Integrated (01/08/2022)   Social Connection and Isolation Panel [NHANES]    Frequency of Communication with Friends and Family: Three times a week    Frequency of Social Gatherings with Friends and Family: Once a week    Attends Religious Services: More than 4 times per year    Active Member of [employer] or Organizations: No    Attends Music therapist: Never    Marital Status: Married   Family History  Problem Relation Age of Onset   Hyperlipidemia Mother    Hypertension Mother    Heart attack Father    Lung disease Father    Heart disease Sister    Hypertension Sister    Colon polyps Sister    Hypertension Sister    Colon polyps Sister     Hypertension Sister    Hypertension Sister    Lung cancer Sister 19       stage 4    Diabetes Other    Breast cancer Other    Heart disease Other    Colon cancer Other 57       nephew  Esophageal cancer Neg Hx    Rectal cancer Neg Hx    Stomach cancer Neg Hx    Amblyopia Neg Hx    Blindness Neg Hx    Cataracts Neg Hx    Glaucoma Neg Hx    Retinal detachment Neg Hx    Strabismus Neg Hx    Retinitis pigmentosa Neg Hx    - negative except otherwise stated in the family history section No Known Allergies Prior to Admission medications   Medication Sig Start Date End Date Taking? Authorizing Provider  albuterol (VENTOLIN HFA) 108 (90 Base) MCG/ACT inhaler TAKE 2 PUFFS BY MOUTH EVERY 6 HOURS AS NEEDED FOR WHEEZE OR SHORTNESS OF BREATH 04/08/22   Freddi Starr, MD  Alcohol Swabs (DROPSAFE ALCOHOL PREP) 70 % PADS USE AS DIRECTED  AS NEEDED. 08/15/21   Midge Minium, MD  aspirin EC 81 MG tablet Take 81 mg by mouth daily.    [provider]  atorvastatin (LIPITOR) 20 MG tablet TAKE 1 TABLET BY MOUTH EVERY DAY 03/30/22   Midge Minium, MD  Blood Glucose Monitoring Suppl (TRUE METRIX METER) w/Device KIT USE AS DIRECTED 11/11/20   Midge Minium, MD  calcium-vitamin D (OSCAL WITH D) 500-5 MG-MCG tablet Take 1 tablet by mouth.    [provider]  cholecalciferol (VITAMIN D) 25 MCG (1000 UNIT) tablet Take 1,000 Units by mouth daily.    [provider]  clonazePAM (KLONOPIN) 1 MG tablet TAKE 1 TABLET AT BEDTIME FOR RESTLESS LEGS SYNDROME 05/21/22   Midge Minium, MD  Continuous Blood Gluc Receiver (DEXCOM G6 RECEIVER) DEVI Use as directed w/ G6 sensor 05/05/22   Midge Minium, MD  Continuous Blood Gluc Sensor (DEXCOM G6 SENSOR) MISC Apply sensor every 10 days for continuous blood sugar readings 05/05/22   Midge Minium, MD  Continuous Blood Gluc Transmit (DEXCOM G6 TRANSMITTER) MISC Use as directed w/ G6 sensor 05/05/22   Midge Minium, MD  diclofenac Sodium (VOLTAREN) 1 % GEL APPLY 2 GRAMS TO AFFECTED AREA 4 TIMES A DAY Patient taking differently: Apply 1 Application topically 4 (four) times daily as needed (pain). 06/06/20   Midge Minium, MD  fenofibrate 160 MG tablet TAKE 1 TABLET EVERY DAY 03/18/22   Midge Minium, MD  furosemide (LASIX) 20 MG tablet TAKE 1 TABLET EVERY DAY 01/21/22   Midge Minium, MD  gabapentin (NEURONTIN) 300 MG capsule Take 3 capsules (900 mg total) by mouth 3 (three) times daily. 12/16/21   Midge Minium, MD  glucose blood (TRUE METRIX BLOOD GLUCOSE TEST) test strip Use as instructed to test blood sugar 4 times daily. Dx. E11.9 08/14/19   Midge Minium, MD  insulin aspart (NOVOLOG FLEXPEN) 100 UNIT/ML FlexPen INJECT 25 UNITS SUBCUTANEOUSLY THREE TIMES DAILY WITH MEALS 06/12/22   Midge Minium, MD  insulin glargine (LANTUS SOLOSTAR) 100 UNIT/ML Solostar Pen INJECT 60 UNITS UNDER THE SKIN AT BEDTIME 06/12/22   Midge Minium, MD  Insulin Pen Needle (DROPLET PEN NEEDLES) 31G X 8 MM MISC USE 4 TIMES DAILY 06/24/21   Midge Minium, MD  Lancets Super Thin 28G MISC Please use as directed to test sugars 4 times daily. Dx. E11.9 08/14/19   Midge Minium, MD  lansoprazole (PREVACID) 15 MG capsule Take 15 mg by mouth daily.    [provider]  levothyroxine (SYNTHROID) 50 MCG tablet TAKE 1 TABLET BY MOUTH EVERY DAY BEFORE BREAKFAST 04/17/22   Annye Asa  E, MD  loperamide (IMODIUM A-D) 2 MG tablet Take 2 mg by mouth 4 (four) times daily as needed for diarrhea or loose stools.    [provider]  neomycin-bacitracin-polymyxin [phone] OINT Apply 1 Application topically daily.    [provider]  primidone (MYSOLINE) 50 MG tablet TAKE  2   TABLETS IN THE AM  AND TAKE 1 IN THE PM  BY MOUTH TWICE A DAY 06/05/22   Tat, Eustace Quail, DO  traZODone (DESYREL) 100 MG tablet TAKE 1 TABLET AT BEDTIME 11/16/21   Midge Minium, MD  valsartan  (DIOVAN) 320 MG tablet TAKE 1 TABLET EVERY DAY 04/23/21   Midge Minium, MD  verapamil (CALAN-SR) 120 MG CR tablet TAKE 1 TABLET BY MOUTH EVERYDAY AT BEDTIME 07/02/22   Midge Minium, MD  Vibegron (GEMTESA) 75 MG TABS Take 1 tablet by mouth daily.    [provider]   CT Shoulder Right Wo Contrast  Result Date: 07/11/2022 CLINICAL DATA:  Anterior dislocation of the shoulder EXAM: CT OF THE UPPER RIGHT EXTREMITY WITHOUT CONTRAST TECHNIQUE: Multidetector CT imaging of the upper right extremity was performed according to the standard protocol. RADIATION DOSE REDUCTION: This exam was performed according to the departmental dose-optimization program which includes automated exposure control, adjustment of the mA and/or kV according to patient size and/or use of iterative reconstruction technique. COMPARISON:  Shoulder radiograph dated 07/11/2022 FINDINGS: Bones/Joint/Cartilage Comminuted fracture of the proximal humerus with inferomedial dislocation of the humeral head, which rests against the subscapularis muscle. The humeral diaphysis is superiorly subluxed into the glenoid fossa. The glenoid appears intact. The acromioclavicular joint is preserved with degenerative changes. Ligaments Suboptimally assessed by CT. Muscles and Tendons Grossly intact. Small volume hematoma within the rotator cuff muscles. Soft tissues Soft tissue stranding in the axillary region. IMPRESSION: Comminuted fracture of the proximal humerus with inferomedial dislocation of the humeral head, which rests against the subscapularis muscle. The humeral diaphysis is superiorly subluxed into the glenoid fossa. Electronically Signed   By: Darrin Nipper M.D.   On: 07/11/2022 11:59   DG Humerus Right  Result Date: 07/11/2022 CLINICAL DATA:  Fall with RIGHT arm pain. EXAM: RIGHT HUMERUS - 2+ VIEW COMPARISON:  None Available. FINDINGS: Anterior-inferior dislocation of the humeral head is noted. Fracture of the humeral neck/head is  noted with greater tuberosity fracture fragment displaced laterally by approximately 2.5 cm. There is minimal irregularity of the inferior glenoid and a glenoid fracture is not excluded. The remainder of the humerus is unremarkable. IMPRESSION: 1. Anterior-inferior dislocation of the humeral head with humeral neck/head fracture and displaced greater tuberosity fracture fragment. 2. Equivocal glenoid fracture. Electronically Signed   By: Margarette Canada M.D.   On: 07/11/2022 08:33   DG Shoulder Right  Result Date: 07/11/2022 CLINICAL DATA:  Acute RIGHT shoulder pain following fall. Initial encounter. EXAM: RIGHT SHOULDER - 3 VIEW COMPARISON:  None Available. FINDINGS: Anterior-inferior dislocation of the humeral head is noted. Fracture of the humeral neck/head is noted with greater tuberosity fracture fragment displaced laterally by approximately 2.5 cm. There is minimal irregularity of the inferior glenoid and a glenoid fracture is not excluded. IMPRESSION: 1. Anterior-inferior dislocation of the humeral head with humeral neck/head fracture and displaced greater tuberosity fracture fragment. 2. Equivocal inferior glenoid fracture. Electronically Signed   By: Margarette Canada M.D.   On: 07/11/2022 08:31     Positive ROS: All other systems have been reviewed and were otherwise negative with the exception of those mentioned  in the HPI and as above.  Physical Exam: General: No acute distress Cardiovascular: No pedal edema Respiratory: No cyanosis, no use of accessory musculature GI: No organomegaly, abdomen is soft and non-tender Skin: No lesions in the area of chief complaint Neurologic: Sensation intact distally Psychiatric: Patient is at baseline mood and affect Lymphatic: No axillary or cervical lymphadenopathy  MUSCULOSKELETAL:  Right shoulder with fullness about the axilla.  She has a strong 2+ radial pulse equal to the contralateral side.  Fingers are all warm and well-perfused.  She is able to extend  at the right wrist as well as fire EPL as well as strongly grip my hand on the right.  Independent Imaging Review: CT scan right shoulder, x-rays right shoulder: There is a right shoulder fracture dislocation with displacement of the humeral head into the axilla.  Assessment: 72 year old female with a right proximal humerus fracture dislocation with displacement of the humeral head into the axillary pouch.  Given this she is currently neurologically and vascularly intact.  I do believe she will require admission to the medical service for close observation and surgery 3/10 for reverse shoulder arthroplasty.  I did describe the long-term expected function should she not elect for surgical intervention would be very poor.  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 N.p.o. at midnight in preparation presents   After a lengthy discussion of treatment options, including risks, benefits, alternatives, complications of surgical and nonsurgical conservative options, the patient elected surgical repair.   The patient  is aware of the material risks  and complications including, but not limited to injury to adjacent structures, neurovascular injury, infection, numbness, bleeding, implant failure, thermal burns, stiffness, persistent pain, failure to heal, disease transmission from allograft, need for further surgery, dislocation, anesthetic risks, blood clots, risks of death,and others. The probabilities of surgical success and failure discussed with patient given their particular co-morbidities.The time and nature of expected rehabilitation and recovery was discussed.The patient's questions were all answered preoperatively.  No barriers to understanding were noted. I  explained the natural history of the disease process and Rx rationale.  I explained to the patient what I considered to be reasonable expectations given their personal situation.  The final treatment plan was arrived at through a shared patient decision making process model.   Thank you for the consult and the opportunity to see Ms. Kathlen Mody, MD [facility] 12:26 PM

## 2022-07-11 NOTE — ED Triage Notes (Signed)
Pt BIBA from home. Pt had a mechanical fall, slipping on wet ramp. Pt c/o right upper arm pain. Pt received 200 mcg fentanyl en route.   No LOC, no thinners. Did not hit head.

## 2022-07-12 ENCOUNTER — Other Ambulatory Visit: Payer: Self-pay

## 2022-07-12 ENCOUNTER — Encounter (HOSPITAL_COMMUNITY)
Admission: EM | Disposition: A | Discharge status: Home Health | Payer: Self-pay | Source: Home / Self Care | Attending: Emergency Medicine

## 2022-07-12 ENCOUNTER — Encounter (HOSPITAL_COMMUNITY): Payer: Self-pay | Admitting: Internal Medicine

## 2022-07-12 ENCOUNTER — Observation Stay (HOSPITAL_COMMUNITY): Payer: Medicare HMO

## 2022-07-12 ENCOUNTER — Observation Stay (HOSPITAL_BASED_OUTPATIENT_CLINIC_OR_DEPARTMENT_OTHER): Payer: Medicare HMO | Admitting: Certified Registered Nurse Anesthetist

## 2022-07-12 ENCOUNTER — Observation Stay (HOSPITAL_COMMUNITY): Payer: Medicare HMO | Admitting: Certified Registered Nurse Anesthetist

## 2022-07-12 DIAGNOSIS — S4291XA Fracture of right shoulder girdle, part unspecified, initial encounter for closed fracture: Secondary | ICD-10-CM | POA: Diagnosis not present

## 2022-07-12 DIAGNOSIS — M7521 Bicipital tendinitis, right shoulder: Secondary | ICD-10-CM | POA: Diagnosis not present

## 2022-07-12 DIAGNOSIS — Z96611 Presence of right artificial shoulder joint: Secondary | ICD-10-CM | POA: Diagnosis not present

## 2022-07-12 DIAGNOSIS — I25119 Atherosclerotic heart disease of native coronary artery with unspecified angina pectoris: Secondary | ICD-10-CM

## 2022-07-12 DIAGNOSIS — S42291A Other displaced fracture of upper end of right humerus, initial encounter for closed fracture: Secondary | ICD-10-CM | POA: Diagnosis not present

## 2022-07-12 DIAGNOSIS — I110 Hypertensive heart disease with heart failure: Secondary | ICD-10-CM | POA: Diagnosis not present

## 2022-07-12 DIAGNOSIS — Z471 Aftercare following joint replacement surgery: Secondary | ICD-10-CM | POA: Diagnosis not present

## 2022-07-12 DIAGNOSIS — S42241A 4-part fracture of surgical neck of right humerus, initial encounter for closed fracture: Secondary | ICD-10-CM

## 2022-07-12 DIAGNOSIS — I509 Heart failure, unspecified: Secondary | ICD-10-CM

## 2022-07-12 DIAGNOSIS — R69 Illness, unspecified: Secondary | ICD-10-CM | POA: Diagnosis not present

## 2022-07-12 DIAGNOSIS — G8918 Other acute postprocedural pain: Secondary | ICD-10-CM | POA: Diagnosis not present

## 2022-07-12 DIAGNOSIS — F17210 Nicotine dependence, cigarettes, uncomplicated: Secondary | ICD-10-CM

## 2022-07-12 HISTORY — PX: REVERSE SHOULDER ARTHROPLASTY: SHX5054

## 2022-07-12 LAB — CBC
HCT: 34.8 % — ABNORMAL LOW (ref 36.0–46.0)
Hemoglobin: 11.4 g/dL — ABNORMAL LOW (ref 12.0–15.0)
MCH: 31.3 pg (ref 26.0–34.0)
MCHC: 32.8 g/dL (ref 30.0–36.0)
MCV: 95.6 fL (ref 80.0–100.0)
Platelets: 153 10*3/uL (ref 150–400)
RBC: 3.64 MIL/uL — ABNORMAL LOW (ref 3.87–5.11)
RDW: 13.5 % (ref 11.5–15.5)
WBC: 8 10*3/uL (ref 4.0–10.5)
nRBC: 0 % (ref 0.0–0.2)

## 2022-07-12 LAB — BASIC METABOLIC PANEL
Anion gap: 9 (ref 5–15)
BUN: 12 mg/dL (ref 8–23)
CO2: 24 mmol/L (ref 22–32)
Calcium: 8.5 mg/dL — ABNORMAL LOW (ref 8.9–10.3)
Chloride: 103 mmol/L (ref 98–111)
Creatinine, Ser: 0.96 mg/dL (ref 0.44–1.00)
GFR, Estimated: >60 mL/min (ref >60)
Glucose, Bld: 155 mg/dL — ABNORMAL HIGH (ref 70–99)
Potassium: 3.9 mmol/L (ref 3.5–5.1)
Sodium: 136 mmol/L (ref 135–145)

## 2022-07-12 LAB — SURGICAL PCR SCREEN
MRSA, PCR: NEGATIVE
Staphylococcus aureus: NEGATIVE

## 2022-07-12 LAB — GLUCOSE, CAPILLARY
Glucose-Capillary: 150 mg/dL — ABNORMAL HIGH (ref 70–99)
Glucose-Capillary: 181 mg/dL — ABNORMAL HIGH (ref 70–99)
Glucose-Capillary: 308 mg/dL — ABNORMAL HIGH (ref 70–99)
Glucose-Capillary: 251 mg/dL — ABNORMAL HIGH (ref 70–99)

## 2022-07-12 LAB — ABO/RH: ABO/RH(D): O POS

## 2022-07-12 LAB — PREPARE RBC (CROSSMATCH)

## 2022-07-12 LAB — MAGNESIUM: Magnesium: 1.8 mg/dL (ref 1.7–2.4)

## 2022-07-12 SURGERY — ARTHROPLASTY, SHOULDER, TOTAL, REVERSE
Anesthesia: General | Site: Shoulder | Laterality: Right

## 2022-07-12 MED ORDER — CEFAZOLIN SODIUM-DEXTROSE 2-3 GM-%(50ML) IV SOLR
INTRAVENOUS | Status: DC | PRN
  Administered 2022-07-12: 2 g via INTRAVENOUS

## 2022-07-12 MED ORDER — LACTATED RINGERS IV SOLN: INTRAVENOUS | Status: DC

## 2022-07-12 MED ORDER — CEFAZOLIN SODIUM-DEXTROSE 2-4 GM/100ML-% IV SOLN
2.0000 g | Freq: Three times a day (TID) | INTRAVENOUS | Status: AC
  Administered 2022-07-12 (×2): 2 g via INTRAVENOUS
  Filled 2022-07-12 (×2): qty 100

## 2022-07-12 MED ORDER — ASPIRIN 325 MG PO TABS
325.0000 mg | ORAL_TABLET | Freq: Every day | ORAL | Status: DC
  Administered 2022-07-13 – 2022-07-14 (×2): 325 mg via ORAL
  Filled 2022-07-12 (×2): qty 1

## 2022-07-12 MED ORDER — FENTANYL CITRATE (PF) 100 MCG/2ML IJ SOLN
INTRAMUSCULAR | Status: AC
  Filled 2022-07-12: qty 2

## 2022-07-12 MED ORDER — AMISULPRIDE (ANTIEMETIC) 5 MG/2ML IV SOLN
INTRAVENOUS | Status: AC
  Filled 2022-07-12: qty 4

## 2022-07-12 MED ORDER — VANCOMYCIN HCL 1000 MG IV SOLR
INTRAVENOUS | Status: DC | PRN
  Administered 2022-07-12: 1000 mg via TOPICAL

## 2022-07-12 MED ORDER — CHLORHEXIDINE GLUCONATE 0.12 % MT SOLN
OROMUCOSAL | Status: AC
  Administered 2022-07-12: 15 mL via OROMUCOSAL
  Filled 2022-07-12: qty 15

## 2022-07-12 MED ORDER — ORAL CARE MOUTH RINSE: 15.0000 mL | Freq: Once | OROMUCOSAL | Status: AC

## 2022-07-12 MED ORDER — ROCURONIUM BROMIDE 10 MG/ML (PF) SYRINGE
PREFILLED_SYRINGE | INTRAVENOUS | Status: AC
  Filled 2022-07-12: qty 10

## 2022-07-12 MED ORDER — PHENYLEPHRINE 80 MCG/ML (10ML) SYRINGE FOR IV PUSH (FOR BLOOD PRESSURE SUPPORT)
PREFILLED_SYRINGE | INTRAVENOUS | Status: DC | PRN
  Administered 2022-07-12: 160 ug via INTRAVENOUS
  Administered 2022-07-12 (×2): 80 ug via INTRAVENOUS

## 2022-07-12 MED ORDER — MEPERIDINE HCL 25 MG/ML IJ SOLN: 6.2500 mg | INTRAMUSCULAR | Status: DC | PRN

## 2022-07-12 MED ORDER — CHLORHEXIDINE GLUCONATE 0.12 % MT SOLN: 15.0000 mL | Freq: Once | OROMUCOSAL | Status: AC

## 2022-07-12 MED ORDER — FENTANYL CITRATE PF 50 MCG/ML IJ SOSY
50.0000 ug | PREFILLED_SYRINGE | Freq: Once | INTRAMUSCULAR | Status: AC
  Administered 2022-07-12: 50 ug via INTRAVENOUS
  Filled 2022-07-12: qty 1

## 2022-07-12 MED ORDER — SUGAMMADEX SODIUM 200 MG/2ML IV SOLN
INTRAVENOUS | Status: DC | PRN
  Administered 2022-07-12: 200 mg via INTRAVENOUS

## 2022-07-12 MED ORDER — IRRISEPT - 450ML BOTTLE WITH 0.05% CHG IN STERILE WATER, USP 99.95% OPTIME
TOPICAL | Status: DC | PRN
  Administered 2022-07-12: 450 mL via TOPICAL

## 2022-07-12 MED ORDER — 0.9 % SODIUM CHLORIDE (POUR BTL) OPTIME
TOPICAL | Status: DC | PRN
  Administered 2022-07-12: 1000 mL

## 2022-07-12 MED ORDER — ASPIRIN 325 MG PO TABS: 325.0000 mg | ORAL_TABLET | Freq: Every day | ORAL | Status: DC

## 2022-07-12 MED ORDER — PHENYLEPHRINE HCL-NACL 20-0.9 MG/250ML-% IV SOLN
INTRAVENOUS | Status: DC | PRN
  Administered 2022-07-12: 50 ug/min via INTRAVENOUS

## 2022-07-12 MED ORDER — DEXAMETHASONE SODIUM PHOSPHATE 10 MG/ML IJ SOLN
INTRAMUSCULAR | Status: DC | PRN
  Administered 2022-07-12: 5 mg via INTRAVENOUS

## 2022-07-12 MED ORDER — ONDANSETRON HCL 4 MG/2ML IJ SOLN
INTRAMUSCULAR | Status: AC
  Filled 2022-07-12: qty 2

## 2022-07-12 MED ORDER — FENTANYL CITRATE (PF) 250 MCG/5ML IJ SOLN
INTRAMUSCULAR | Status: AC
  Filled 2022-07-12: qty 5

## 2022-07-12 MED ORDER — DEXAMETHASONE SODIUM PHOSPHATE 10 MG/ML IJ SOLN
INTRAMUSCULAR | Status: AC
  Filled 2022-07-12: qty 1

## 2022-07-12 MED ORDER — PROPOFOL 10 MG/ML IV BOLUS
INTRAVENOUS | Status: AC
  Filled 2022-07-12: qty 20

## 2022-07-12 MED ORDER — SODIUM CHLORIDE 0.9 % IR SOLN
Status: DC | PRN
  Administered 2022-07-12: 3000 mL

## 2022-07-12 MED ORDER — BUPIVACAINE LIPOSOME 1.3 % IJ SUSP
INTRAMUSCULAR | Status: DC | PRN
  Administered 2022-07-12: 10 mL via PERINEURAL

## 2022-07-12 MED ORDER — LACTATED RINGERS IV SOLN: INTRAVENOUS | Status: DC | PRN

## 2022-07-12 MED ORDER — FENTANYL CITRATE (PF) 100 MCG/2ML IJ SOLN
25.0000 ug | INTRAMUSCULAR | Status: DC | PRN
  Administered 2022-07-12 (×3): 50 ug via INTRAVENOUS

## 2022-07-12 MED ORDER — AMISULPRIDE (ANTIEMETIC) 5 MG/2ML IV SOLN
10.0000 mg | Freq: Once | INTRAVENOUS | Status: AC | PRN
  Administered 2022-07-12: 10 mg via INTRAVENOUS

## 2022-07-12 MED ORDER — CEFAZOLIN SODIUM-DEXTROSE 2-4 GM/100ML-% IV SOLN
INTRAVENOUS | Status: AC
  Filled 2022-07-12: qty 100

## 2022-07-12 MED ORDER — ROCURONIUM BROMIDE 10 MG/ML (PF) SYRINGE
PREFILLED_SYRINGE | INTRAVENOUS | Status: DC | PRN
  Administered 2022-07-12: 10 mg via INTRAVENOUS
  Administered 2022-07-12 (×2): 20 mg via INTRAVENOUS
  Administered 2022-07-12: 70 mg via INTRAVENOUS

## 2022-07-12 MED ORDER — SODIUM CHLORIDE 0.9% IV SOLUTION: Freq: Once | INTRAVENOUS | Status: DC

## 2022-07-12 MED ORDER — MUPIROCIN 2 % EX OINT
1.0000 | TOPICAL_OINTMENT | Freq: Two times a day (BID) | CUTANEOUS | Status: DC
  Administered 2022-07-12: 1 via NASAL
  Filled 2022-07-12: qty 22

## 2022-07-12 MED ORDER — LIDOCAINE 2% (20 MG/ML) 5 ML SYRINGE
INTRAMUSCULAR | Status: DC | PRN
  Administered 2022-07-12: 100 mg via INTRAVENOUS

## 2022-07-12 MED ORDER — ALBUTEROL SULFATE HFA 108 (90 BASE) MCG/ACT IN AERS
INHALATION_SPRAY | RESPIRATORY_TRACT | Status: DC | PRN
  Administered 2022-07-12: 4 via RESPIRATORY_TRACT

## 2022-07-12 MED ORDER — ACETAMINOPHEN 10 MG/ML IV SOLN
INTRAVENOUS | Status: AC
  Administered 2022-07-12: 1000 mg
  Filled 2022-07-12: qty 100

## 2022-07-12 MED ORDER — ONDANSETRON HCL 4 MG/2ML IJ SOLN
INTRAMUSCULAR | Status: DC | PRN
  Administered 2022-07-12: 4 mg via INTRAVENOUS

## 2022-07-12 MED ORDER — POVIDONE-IODINE 10 % EX SOLN
CUTANEOUS | Status: DC | PRN
  Administered 2022-07-12: 1 via TOPICAL

## 2022-07-12 MED ORDER — PHENYLEPHRINE 80 MCG/ML (10ML) SYRINGE FOR IV PUSH (FOR BLOOD PRESSURE SUPPORT)
PREFILLED_SYRINGE | INTRAVENOUS | Status: AC
  Filled 2022-07-12: qty 10

## 2022-07-12 MED ORDER — FENTANYL CITRATE (PF) 250 MCG/5ML IJ SOLN
INTRAMUSCULAR | Status: DC | PRN
  Administered 2022-07-12: 50 ug via INTRAVENOUS
  Administered 2022-07-12: 100 ug via INTRAVENOUS

## 2022-07-12 MED ORDER — LIDOCAINE 2% (20 MG/ML) 5 ML SYRINGE
INTRAMUSCULAR | Status: AC
  Filled 2022-07-12: qty 5

## 2022-07-12 MED ORDER — INSULIN ASPART 100 UNIT/ML IJ SOLN: 0.0000 [IU] | INTRAMUSCULAR | Status: DC | PRN

## 2022-07-12 MED ORDER — VANCOMYCIN HCL 1000 MG IV SOLR
INTRAVENOUS | Status: AC
  Filled 2022-07-12: qty 20

## 2022-07-12 MED ORDER — BUPIVACAINE HCL (PF) 0.5 % IJ SOLN
INTRAMUSCULAR | Status: DC | PRN
  Administered 2022-07-12: 10 mL via PERINEURAL

## 2022-07-12 MED ORDER — PROPOFOL 10 MG/ML IV BOLUS
INTRAVENOUS | Status: DC | PRN
  Administered 2022-07-12: 14 mg via INTRAVENOUS

## 2022-07-12 SURGICAL SUPPLY — 71 items
AID PSTN UNV HD RSTRNT DISP (MISCELLANEOUS) ×1
APL PRP STRL LF DISP 70% ISPRP (MISCELLANEOUS) ×1
BAG COUNTER SPONGE SURGICOUNT (BAG) ×1 IMPLANT
BASEPLATE GLENOSPHERE 25 STD (Miscellaneous) IMPLANT
BIT DRILL 3.2 PERIPHERAL SCREW (BIT) IMPLANT
BLADE SAW SAG 29X58X.64 (BLADE) IMPLANT
BSPLAT GLND STD 25 RVRS SHLDR (Miscellaneous) ×1 IMPLANT
CHLORAPREP W/TINT 26 (MISCELLANEOUS) ×1 IMPLANT
COOLER ICEMAN CLASSIC (MISCELLANEOUS) ×1 IMPLANT
COVER SURGICAL LIGHT HANDLE (MISCELLANEOUS) ×1 IMPLANT
CUP HUM REUNION RSA 36X2 (Cup) IMPLANT
DRAPE IMP U-DRAPE 54X76 (DRAPES) ×1 IMPLANT
DRAPE INCISE IOBAN 66X45 STRL (DRAPES) ×1 IMPLANT
DRAPE U-SHAPE 47X51 STRL (DRAPES) ×2 IMPLANT
DRESSING AQUACEL AG SP 3.5X10 (GAUZE/BANDAGES/DRESSINGS) IMPLANT
DRSG AQUACEL AG ADV 3.5X10 (GAUZE/BANDAGES/DRESSINGS) ×1 IMPLANT
DRSG AQUACEL AG SP 3.5X10 (GAUZE/BANDAGES/DRESSINGS) ×1
ELECT BLADE 4.0 EZ CLEAN MEGAD (MISCELLANEOUS) ×1
ELECT REM PT RETURN 9FT ADLT (ELECTROSURGICAL) ×1
ELECTRODE BLDE 4.0 EZ CLN MEGD (MISCELLANEOUS) ×1 IMPLANT
ELECTRODE REM PT RTRN 9FT ADLT (ELECTROSURGICAL) ×1 IMPLANT
GLOVE BIO SURGEON STRL SZ7.5 (GLOVE) ×3 IMPLANT
GLOVE BIOGEL PI IND STRL 6.5 (GLOVE) ×1 IMPLANT
GLOVE BIOGEL PI IND STRL 8 (GLOVE) ×2 IMPLANT
GLOVE ECLIPSE 6.0 STRL STRAW (GLOVE) ×1 IMPLANT
GLOVE INDICATOR 8.0 STRL GRN (GLOVE) ×1 IMPLANT
GOWN STRL REUS W/ TWL LRG LVL3 (GOWN DISPOSABLE) ×2 IMPLANT
GOWN STRL REUS W/TWL LRG LVL3 (GOWN DISPOSABLE) ×2
GUIDEWIRE GLENOID 2.5X220 (WIRE) IMPLANT
HANDPIECE INTERPULSE COAX TIP (DISPOSABLE) ×1
INSERT HUM REUNION RSA 36X4 E (Insert) IMPLANT
KIT BASIN OR (CUSTOM PROCEDURE TRAY) ×1 IMPLANT
KIT STABILIZATION SHOULDER (MISCELLANEOUS) ×1 IMPLANT
KIT TURNOVER KIT B (KITS) ×1 IMPLANT
MANIFOLD NEPTUNE II (INSTRUMENTS) ×1 IMPLANT
NDL FILTER BLUNT 18X1 1/2 (NEEDLE) IMPLANT
NDL HYPO 21X1 ECLIPSE (NEEDLE) ×1 IMPLANT
NDL MAYO TROCAR (NEEDLE) ×1 IMPLANT
NEEDLE FILTER BLUNT 18X1 1/2 (NEEDLE) ×1 IMPLANT
NEEDLE HYPO 21X1 ECLIPSE (NEEDLE) ×1 IMPLANT
NEEDLE MAYO TROCAR (NEEDLE) ×1 IMPLANT
NS IRRIG 1000ML POUR BTL (IV SOLUTION) ×1 IMPLANT
PACK SHOULDER (CUSTOM PROCEDURE TRAY) ×1 IMPLANT
PACK UNIVERSAL I (CUSTOM PROCEDURE TRAY) ×1 IMPLANT
PAD ARMBOARD 7.5X6 YLW CONV (MISCELLANEOUS) ×2 IMPLANT
PAD COLD SHLDR WRAP-ON (PAD) ×1 IMPLANT
RESTRAINT HEAD UNIVERSAL NS (MISCELLANEOUS) ×1 IMPLANT
SCREW 5.0X18 (Screw) IMPLANT
SCREW BONE INTRNL SM 7 (Screw) IMPLANT
SCREW PERIPHERAL 30 (Screw) IMPLANT
SET HNDPC FAN SPRY TIP SCT (DISPOSABLE) ×1 IMPLANT
SLING ARM IMMOBILIZER LRG (SOFTGOODS) ×1 IMPLANT
SPHERE GLENOID LAT REV 36 (Joint) IMPLANT
SPONGE T-LAP 18X18 ~~LOC~~+RFID (SPONGE) ×1 IMPLANT
STAPLER VISISTAT 35W (STAPLE) ×1 IMPLANT
STEM HUM REUNION STD 8 (Stem) IMPLANT
SUCTION FRAZIER HANDLE 10FR (MISCELLANEOUS) ×1
SUCTION TUBE FRAZIER 10FR DISP (MISCELLANEOUS) ×1 IMPLANT
SUT ETHIBOND 2 OS 4 DA (SUTURE) IMPLANT
SUT FIBERWIRE #5 38 CONV NDL (SUTURE) ×2
SUT VIC AB 0 CT1 27 (SUTURE) ×2
SUT VIC AB 0 CT1 27XBRD ANBCTR (SUTURE) ×2 IMPLANT
SUT VIC AB 2-0 CT1 27 (SUTURE) ×2
SUT VIC AB 2-0 CT1 TAPERPNT 27 (SUTURE) ×2 IMPLANT
SUTURE FIBERWR #5 38 CONV NDL (SUTURE) ×2 IMPLANT
SYR 30ML LL (SYRINGE) IMPLANT
SYR 50ML LL SCALE MARK (SYRINGE) ×1 IMPLANT
TAPE LABRALWHITE 1.5X36 (TAPE) IMPLANT
TAPE SUT LABRALTAP WHT/BLK (SUTURE) IMPLANT
TOWEL GREEN STERILE (TOWEL DISPOSABLE) ×1 IMPLANT
WATER STERILE IRR 1000ML POUR (IV SOLUTION) ×1 IMPLANT

## 2022-07-12 NOTE — Brief Op Note (Signed)
   Brief Op Note  Date of Surgery: 07/12/2022  Preoperative Diagnosis: Right Shoulder Fracture  Postoperative Diagnosis: same  Procedure: Procedure(s): REVERSE SHOULDER ARTHROPLASTY  Implants: Implant Name Type Inv. Item Serial No. Manufacturer Lot No. LRB No. Used Action  SPHERE GLENOID LAT REV 36 - [id number] Joint SPHERE GLENOID LAT REV 36 [id number] [employer]  Right 1 Implanted  BASEPLATE GLENOSPHERE 25 STD - [id number] Miscellaneous BASEPLATE GLENOSPHERE 25 STD [id number] [employer]  Right 1 Implanted  SCREW BONE INTRNL SM 7 - [id number] Screw SCREW BONE INTRNL SM 7 [id number] [employer]  Right 1 Implanted  SCREW PERIPHERAL 30 - [id number] Screw SCREW PERIPHERAL 30  [employer]  Right 2 Implanted  SCREW 5.0X18 - [id number] Screw SCREW 5.0X18  [employer]  Right 2 Implanted  STEM HUM REUNION STD 8 - [id number] Stem STEM HUM REUNION STD 8  [employer] [id number] Right 1 Implanted  reUnion RSA X3 humeral insert Insert   [employer] [id number] Right 1 Implanted  Re[employer] RSA Humeral Cup Cup   [employer] [id number] Right 1 Implanted    Surgeons: Surgeon(s): Vanetta Mulders, MD  Anesthesia: General    Estimated Blood Loss: See anesthesia record  Complications: None  Condition to PACU: Stable  Yevonne Pax, MD 07/12/2022 10:54 AM

## 2022-07-12 NOTE — Progress Notes (Signed)
PROGRESS NOTE  Lacey [name]  DOB: 1951-01-28  PCP: Midge Minium, MD FV:[id number]  DOA: 07/11/2022  LOS: 0 days  Hospital Day: 2  Brief narrative: Lacey [name] is a 72 y.o. female with PMH significant for DM2, HTN, nonobstructive CAD, atrial tachycardia, chronic anemia, restless leg/tremor 3/9, patient was brought to the ED after a mechanical fall resulting in right shoulder dislocation.   Patient states she was walking her dog in the rain when she slipped on the ramp and fell onto her right side. Immediately after the fall she had severe right shoulder pain and rolled over onto her back.  She was on the floor for some time as her husband could not hear her.  Eventually got up and presented to the ED due to severe pain.  In the ED patient was hemodynamically stable WBC slightly elevated to 13.4. X-ray right shoulder showed comminuted fracture of the proximal humerus with inferomedial dislocation of the humeral head, which rests against the subscapularis muscle. The humeral diaphysis is superiorly subluxed into the glenoid fossa.   Seen by orthopedics. Patient underwent closed reduction in the ED and plan for surgery in a.m. Admitted to [facility] 3/10, underwent right shoulder reverse shoulder arthroplasty  Subjective: Patient was seen and examined this morning in PACU. Patient was gradually waking up from surgery.  She was acting a little confused.  Wanted to sit up at the edge of the bed.  Easily orientable with counseling.  Assessment and plan: Right displaced proximal humeral fracture with significant displacement S/p right shoulder reverse arthroplasty -Dr. Sammuel Hines 3/10 Secondary to mechanical fall.   Imaging as above.  Orthopedics consulted.  Surgery done Currently not in pain because of the nerve block.   Orthopedics has started DVT prophylaxis with aspirin 325 mg daily.  Type 2 diabetes mellitus A1c 6.7 on 05/05/2022 PTA on Lantus 30 units nightly, Premeal NovoLog 25 units 3  times daily. Currently ordered for Semglee 30 units nightly, NovoLog 10 units scheduled 3 times daily Premeal and sliding scale insulin Blood sugar control is satisfactory.  Continue same plan. Recent Labs  Lab 07/11/22 1226 07/11/22 1631 07/11/22 2129 07/12/22 0705 07/12/22 1250  GLUCAP 147* 208* 163* 150* 181*   Chronic diastolic CHF Essential hypertension Continue Lasix 20 mg daily, valsartan 320 mg daily, verapamil 120 mg daily Continue verapamil.  I will hold Lasix and ARB perioperatively.  Nonobstructive CAD HLD Continue beta-blocker, fenofibrate daily.  Previously on aspirin 81 mg daily.  Currently on hold while on DVT prophylaxis with aspirin 325 mg daily.  Hypothyroidism Synthroid  Morbid Obesity  Body mass index is 42.06 kg/m. Patient has been advised to make an attempt to improve diet and exercise patterns to aid in weight loss.  OSA  Chronic smoker Counseled to quit.  Restless legs Continue Neurontin 90 mg 3 times daily, primidone  Anxiety/depression Continue Klonopin 1 mg daily as needed    Mobility: PT eval pending  Goals of care   Code Status: Full Code     DVT prophylaxis:  Place and maintain sequential compression device Start: 07/11/22 1454 SCDs Start: 07/11/22 1441   Antimicrobials: Perioperative antibiotics Fluid: None Consultants: Orthopedics Family Communication: None at bedside  Status is: Observation Level of care: Telemetry Medical   Dispo: Patient is from: Home              Anticipated d/c is to: POD 0, pending PT eval Continue in-hospital care because: Underwent surgery today.  Needs to monitor for recovery.  Pending  PT eval.   Scheduled Meds:  sodium chloride   Intravenous Once   amisulpride       aspirin  325 mg Oral Daily   atorvastatin  20 mg Oral Daily   calcium-vitamin D  1 tablet Oral BID   cholecalciferol  1,000 Units Oral Daily   fenofibrate  160 mg Oral Daily   fentaNYL       fentaNYL       fentaNYL        fentaNYL       gabapentin  900 mg Oral TID   insulin aspart  0-15 Units Subcutaneous TID WC   insulin aspart  10 Units Subcutaneous TID WC   insulin glargine-yfgn  30 Units Subcutaneous QHS   ipratropium-albuterol  3 mL Nebulization Once   levothyroxine  50 mcg Oral Q0600   mirabegron ER  25 mg Oral Daily   mupirocin ointment  1 Application Nasal BID   pantoprazole  20 mg Oral Daily   primidone  100 mg Oral Daily   primidone  50 mg Oral QHS   traZODone  100 mg Oral QHS   verapamil  120 mg Oral Daily    PRN meds: acetaminophen **OR** acetaminophen, amisulpride, ceFAZolin, clonazePAM, fentaNYL, fentaNYL, fentaNYL, fentaNYL, hydrALAZINE, ipratropium-albuterol, morphine injection, ondansetron **OR** ondansetron (ZOFRAN) IV, oxyCODONE   Infusions:   ceFAZolin      ceFAZolin (ANCEF) IV      Diet:  Diet Order             Diet regular Room service appropriate? Yes; Fluid consistency: Thin  Diet effective now                   Antimicrobials: Anti-infectives (From admission, onward)    Start     Dose/Rate Route Frequency Ordered Stop   07/12/22 1400  ceFAZolin (ANCEF) IVPB 2g/100 mL premix        2 g 200 mL/hr over 30 Minutes Intravenous Every 8 hours 07/12/22 1219 07/13/22 0559   07/12/22 0907  vancomycin (VANCOCIN) powder  Status:  Discontinued          As needed 07/12/22 0908 07/12/22 1112   07/12/22 0750  ceFAZolin (ANCEF) 2-4 GM/100ML-% IVPB       Note to Pharmacy: [facility] [place]: cabinet override      07/12/22 0750 07/12/22 1959       Skin assessment:       Nutritional status:  Body mass index is 42.06 kg/m.          Objective: Vitals:   07/12/22 1215 07/12/22 1255  BP: (!) 175/50 (!) 168/52  Pulse: 77 75  Resp: 17 18  Temp: 98.2 F (36.8 C) 97.8 F (36.6 C)  SpO2: 92% 96%    Intake/Output Summary (Last 24 hours) at 07/12/2022 1346 Last data filed at 07/12/2022 1036 Gross per 24 hour  Intake 1307 ml  Output 100 ml  Net 1207 ml   Filed  Weights   07/11/22 0725 07/12/22 0708  Weight: 104.3 kg 104.3 kg   Weight change:  Body mass index is 42.06 kg/m.   Physical Exam: General exam: Pleasant elderly Caucasian female.  In PACU.  Pain controlled with arm block Skin: No rashes, lesions or ulcers. HEENT: Atraumatic, normocephalic, no obvious bleeding Lungs: Clear to auscultation bilaterally CVS: Regular rate and rhythm, no murmur GI/Abd soft, nontender, nondistended, bowels are present CNS: Gradually waking up from anesthesia Psychiatry: Unable to examine because of altered mentation Extremities: No pedal edema,  no calf tenderness.  Data Review: I have personally reviewed the laboratory data and studies available.  F/u labs ordered Unresulted Labs (From admission, onward)    None       Total time spent in review of labs and imaging, patient evaluation, formulation of plan, documentation and communication with family: 61 minutes  Signed, Terrilee Croak, MD [facility] 07/12/2022

## 2022-07-12 NOTE — Transfer of Care (Signed)
Immediate Anesthesia Transfer of Care Note  Patient: Lacey [name]  Procedure(s) Performed: REVERSE SHOULDER ARTHROPLASTY (Right: Shoulder)  Patient Location: PACU  Anesthesia Type:General  Level of Consciousness: awake, alert , and oriented  Airway & Oxygen Therapy: Patient Spontanous Breathing and Patient connected to face mask oxygen  Post-op Assessment: Report given to RN and Post -op Vital signs reviewed and stable  Post vital signs: Reviewed and stable  Last Vitals:  Vitals Value Taken Time  BP 118/91 07/12/22 1116  Temp    Pulse 70 07/12/22 1131  Resp 19 07/12/22 1131  SpO2 91 % 07/12/22 1131  Vitals shown include unvalidated device data.  Last Pain:  Vitals:   07/12/22 0741  TempSrc:   PainSc: 0-No pain      Patients Stated Pain Goal: 0 ([id number])  Complications: No notable events documented.

## 2022-07-12 NOTE — Interval H&P Note (Signed)
History and Physical Interval Note:  07/12/2022 7:04 AM  Lacey [name]  has presented today for surgery, with the diagnosis of Right Shoulder Fx.  The various methods of treatment have been discussed with the patient and family. After consideration of risks, benefits and other options for treatment, the patient has consented to  Procedure(s): REVERSE SHOULDER ARTHROPLASTY (Right) as a surgical intervention.  The patient's history has been reviewed, patient examined, no change in status, stable for surgery.  I have reviewed the patient's chart and labs.  Questions were answered to the patient's satisfaction.     Vanetta Mulders

## 2022-07-12 NOTE — Anesthesia Procedure Notes (Signed)
Procedure Name: Intubation Date/Time: 07/12/2022 8:09 AM  Performed by: Inda Coke, CRNAPre-anesthesia Checklist: Patient identified, Emergency Drugs available, Suction available, Timeout performed and Patient being monitored Patient Re-evaluated:Patient Re-evaluated prior to induction Oxygen Delivery Method: Circle system utilized Preoxygenation: Pre-oxygenation with 100% oxygen Induction Type: IV induction Ventilation: Mask ventilation without difficulty Laryngoscope Size: Mac and 3 Grade View: Grade I Tube type: Oral Tube size: 7.0 mm Number of attempts: 1 Airway Equipment and Method: Stylet Placement Confirmation: ETT inserted through vocal cords under direct vision, positive ETCO2, CO2 detector and breath sounds checked- equal and bilateral Secured at: 22 cm Tube secured with: Tape Dental Injury: Teeth and Oropharynx as per pre-operative assessment

## 2022-07-12 NOTE — Anesthesia Procedure Notes (Signed)
Anesthesia Regional Block: Interscalene brachial plexus block   Pre-Anesthetic Checklist: , timeout performed,  Correct Patient, Correct Site, Correct Laterality,  Correct Procedure, Correct Position, site marked,  Risks and benefits discussed,  Surgical consent,  Pre-op evaluation,  At surgeon's request and post-op pain management  Laterality: Upper and Right  Prep: chloraprep       Needles:  Injection technique: Single-shot  Needle Type: Stimulator Needle - 40     Needle Length: 4cm  Needle Gauge: 22     Additional Needles:   Procedures:,,,, ultrasound used (permanent image in chart),,    Narrative:  Start time: 07/12/2022 7:18 AM End time: 07/12/2022 7:38 AM Injection made incrementally with aspirations every 5 mL.  Performed by: Personally  Anesthesiologist: Nolon Nations, MD  Additional Notes: BP cuff, SpO2 and EKG monitors applied. Sedation begun. Nerve location verified with ultrasound. Anesthetic injected incrementally, slowly, and after neg aspirations under direct u/s guidance. Good perineural spread. Tolerated well.

## 2022-07-12 NOTE — Discharge Instructions (Signed)
     Discharge Instructions    Attending Surgeon: Vanetta Mulders, MD Office Phone Number: [phone]   Diagnosis and Procedures:    Surgeries Performed: Right reverse shoulder replacement  Discharge Plan:   Activity:  Keep sling and dressing in place until your follow up visit in Physical Therapy You are advised to go home directly from the hospital or surgical center. Restrict your activities.  GENERAL INSTRUCTIONS: 1.  Keep your surgical site elevated above your heart for at least 5-7 days or longer to prevent swelling. This will improve your comfort and your overall recovery following surgery.     2. Please call Dr. Eddie Dibbles office at [phone] with questions Monday-Friday during business hours. If no one answers, please leave a message and someone should get back to the patient within 24 hours. For emergencies please call 911 or proceed to the emergency room.   3. Patient to notify surgical team if experiences any of the following: Bowel/Bladder dysfunction, uncontrolled pain, nerve/muscle weakness, incision with increased drainage or redness, nausea/vomiting and Fever greater than 101.0 F.  Be alert for signs of infection including redness, streaking, odor, fever or chills. Be alert for excessive pain or bleeding and notify your surgeon immediately.  WOUND INSTRUCTIONS:   Leave your dressing/cast/splint in place until your post operative visit.  Keep it clean and dry.  Always keep the incision clean and dry until the staples/sutures are removed. If there is no drainage from the incision you should keep it open to air. If there is drainage from the incision you must keep it covered at all times until the drainage stops  Do not soak in a bath tub, hot tub, pool, lake or other body of water until 21 days after your surgery and your incision is completely dry and healed.  If you have removable sutures (or staples) they must be removed 10-14 days (unless otherwise instructed)  from the day of your surgery.     1)  Elevate the extremity as much as possible.  2)  Keep the dressing clean and dry.  3)  Please call us if the dressing becomes wet or dirty.  4)  If you are experiencing worsening pain or worsening swelling, please call.

## 2022-07-12 NOTE — Anesthesia Preprocedure Evaluation (Addendum)
Anesthesia Evaluation  Patient identified by MRN, date of birth, ID band Patient awake    Reviewed: Allergy & Precautions, H&P , NPO status , Patient's Chart, lab work & pertinent test results  Airway Mallampati: III  TM Distance: >3 FB Neck ROM: Full    Dental  (+) Dental Advisory Given, Missing Missing many rear teeth:   Pulmonary shortness of breath, sleep apnea , pneumonia, Current Smoker   + rhonchi  + decreased breath sounds+ wheezing      Cardiovascular hypertension, Pt. on home beta blockers and Pt. on medications + angina  + CAD and +CHF  + dysrhythmias Supra Ventricular Tachycardia  Rhythm:Regular Rate:Normal  Stress 11/2021   Exercise capacity was moderately impaired. Patient exercised for 5 min and 0 sec. Maximum HR of 127 bpm. MPHR 85.0 %. Peak METS 6.2 . Elevated blood pressure and normal heart rate response noted during stress. Heart rate recovery was normal.   2.0 mm of horizontal ST depression in the inferior and inferolateral leads (II, III, aVF, V5 and V6) was noted. ST depression began during stress and ended during recovery. ST deviation persisted during recovery. ECG was interpretable and conclusive. The ECG was positive for ischemia.   Prior study not available for comparison.   Positive for ischemia, with 2 mm ST depressions (horizontal to downsloping) in inferior and inferolateral leads that persisted throughout recovery.   LHC 12/2021   Mid Cx lesion is 25% stenosed.   The left ventricular systolic function is normal.   LV end diastolic pressure is normal.   The left ventricular ejection fraction is 55-65% by visual estimate.   There is no aortic valve stenosis.   Mild, nonobstructive coronary artery disease.  Continue preventive therapy.     Neuro/Psych TIA Neuromuscular disease  negative psych ROS   GI/Hepatic Neg liver ROS,GERD  Medicated and Controlled,,  Endo/Other  diabetes, Type 2, Oral  Hypoglycemic AgentsHypothyroidism  Morbid obesity  Renal/GU Renal disease     Musculoskeletal  (+) Arthritis ,    Abdominal  (+) + obese  Peds  Hematology  (+) Blood dyscrasia, anemia   Anesthesia Other Findings   Reproductive/Obstetrics negative OB ROS                             Anesthesia Physical Anesthesia Plan  ASA: 3  Anesthesia Plan: General   Post-op Pain Management: Regional block*, Gabapentin PO (pre-op)* and Tylenol PO (pre-op)*   Induction: Intravenous  PONV Risk Score and Plan: 2 and Ondansetron, Dexamethasone and Treatment may vary due to age or medical condition  Airway Management Planned: Oral ETT  Additional Equipment:   Intra-op Plan:   Post-operative Plan: Extubation in OR  Informed Consent: I have reviewed the patients History and Physical, chart, labs and discussed the procedure including the risks, benefits and alternatives for the proposed anesthesia with the patient or authorized representative who has indicated his/her understanding and acceptance.     Dental advisory given  Plan Discussed with: CRNA  Anesthesia Plan Comments:         Anesthesia Quick Evaluation

## 2022-07-12 NOTE — Op Note (Signed)
Date of Surgery: 07/12/2022  INDICATIONS: Lacey [name] is a 72 y.o.-year-old female with with a displaced proximal humeral fracture.  The risk and benefits of the procedure were discussed in detail and documented in the pre-operative evaluation.   PREOPERATIVE DIAGNOSIS: 1.  Right displaced proximal humeral fracture four-part with significant humeral head displacement  POSTOPERATIVE DIAGNOSIS: Same.  PROCEDURE: 1.  Right reverse shoulder arthroplasty for fracture 2.  Right biceps proximal tenodesis 3.  Right axillary nerve neurolysis  SURGEON: Yevonne Pax MD  ASSISTANT: Raynelle Fanning, ATC plus interscalene nerve block  ANESTHESIA:  general  IV FLUIDS AND URINE: See anesthesia record.  ANTIBIOTICS: Ancef  ESTIMATED BLOOD LOSS: 50 mL.  IMPLANTS:  Implant Name Type Inv. Item Serial No. Manufacturer Lot No. LRB No. Used Action  SPHERE GLENOID LAT REV 36 - [id number] Joint SPHERE GLENOID LAT REV 36 [id number] [employer]  Right 1 Implanted  BASEPLATE GLENOSPHERE 25 STD - [id number] Miscellaneous BASEPLATE GLENOSPHERE 25 STD [id number] [employer]  Right 1 Implanted  SCREW BONE INTRNL SM 7 - [id number] Screw SCREW BONE INTRNL SM 7 [id number] [employer]  Right 1 Implanted  SCREW PERIPHERAL 30 - [id number] Screw SCREW PERIPHERAL 30  [employer]  Right 2 Implanted  SCREW 5.0X18 - [id number] Screw SCREW 5.0X18  [employer]  Right 2 Implanted  STEM HUM REUNION STD 8 - [id number] Stem STEM HUM REUNION STD 8  [employer] [id number] A Right 1 Implanted  reUnion RSA X3 humeral insert Insert   [employer] [id number] Right 1 Implanted  [employer] RSA Humeral Cup Cup   [employer] [id number] Right 1 Implanted    DRAINS: None  CULTURES: None  COMPLICATIONS: none  DESCRIPTION OF PROCEDURE:  Patient was identified in the preoperative holding area.  Anesthesia performed an interscalene nerve block after universal timeout was performed with nursing.  Ancef was given 1 hour prior to  skin incision.    The surgical site was scrubbed with a chlorhexidine scrub brush and alcohol.  The patient was then prepped with chlorhexidine skin prep.  The patient was subsequently taken back to the operating room.  Anesthesia was induced. The patient was transferred to the beachchair position.  All bony prominences were padded.  Final timeout was again performed.     The bony landmarks of the shoulder were marked with a marking pen. A delto-pectoral incision was made, extending up approximately 5 inches. The wound with then irrigated with dilute betadine. Cephalic vein was identified, and an protected. This was retracted medially. Subdeltoid and subpectoral lesions were released. Neurovascular structures were carefully protected. The Gelpi retractor was used to retract the deltoid and pectoralis major.    The deltoid was retracted laterally with a Brown humeral retractor.  The conjoined tendon was identified. The cleido-pectoral fascia was excised.  The axillary nerve was palpated and carefully protected throughout the procedure. The biceps tendon was found and tenodesed to the upper pec with # 2 Ethibond non-absorbable suture.  Proximally the biceps tendon was removed up to the joint.  The bicipital groove was used for a landmark to establish rotator cuff interval. The subscap was tagged with a #2 Ethibond.  At this time the lesser tuberosity fracture was identified and tagged with a #2 Ethibond.  Three #5 FiberWire's were brought through the greater tuberosity fracture for future passing through the stem.  At this time the glenoid was exposed by opening up the tuberosities.  The fracture hematoma was thoroughly irrigated.  The humeral shaft was identified.  At this time the capsule anteriorly was excised.  At this time careful layer by layer dissection was performed and the axillary artery and nerves were both visualized.  Metzenbaum scissors were used to carefully spread around these.  At this time  the humeral head was identified and this was slowly teased out with a snap without any type of damage to the nerve or the artery which were both visualized.  The humerus was prepped initially by referencing off the 30 degrees retroversion guide. This was done with the guide using 30 degrees of retroversion as a reference.   We used a size 8 humeral stem and impacted this into location.   Attention was then turned to the glenoid.  Posteriorly a large Darach retractor was used.  A 360 Degree release of the subscapularis and glenoid were done. The capsule was released from the humerus.    Glenoid retractors were placed posteriorly, superiorly behind the biceps tendon and anteriorly on the glenoid neck. A 360-degree release of the capsule was performed with cautery.  The triceps was released off the inferior tubercle of the glenoid. The axillary nerve was carefully protected with the surgeon's index finger, retracting it and using cautery.   A guidepin was placed through the glenoid guide. The guidepin was drilled until it exited the cortex. The guidepin was over drilled. Next, the glenoid was prepared with the reamer down to cortical bone.  The central peg hole was totally within the scapular neck tested with the probe.  The baseplate was then placed screwed securely with good purchase in position and then secured with 4 screws. In each case, they were drilled and measured and the appropriate length screw placed with excellent rigid fixation of the baseplate. The glenosphere was placed with size based based on pre-operative templating.   The humerus was then delivered and a neutral polyethylene trial was placed.  This was brought to just the level of the reduction but not completely reduced.  The sutures from the greater tuberosity were then placed into the humeral stem.  A +4 poly and a +2 tray was selected and impacted.  This was reduced with excellent tension.  At this time the remaining bone graft from the  humeral head was morselized and brought around the neck.  The greater tuberosity sutures were then passed to the lesser tuberosity and these were subsequently tied together.  This had excellent tuberosity apposition.  A vertical escape suture was also used with a #2 FiberWire into the shaft of the humerus into both the lesser and greater tuberosity.   Appropriate tension was noted on the conjoined tendon and deltoid muscle.  Extension was stable, external and internal rotation as well.  The subscap was pulled over but as this was not able to reach comfortably decision was made not to repair in order to prevent limited in external rotation.  The wound was then irrigated. Vancomycin powder was placed in the wound again for infection prevention.   The wound was then closed in layers with 0 Vicryl interrupted in the deep subcu followed by 2-0 Vicryl in the superficial subcu and 3-0 nylon for skin.  An Aquacel dressing was applied as well as an Naval architect.  A shoulder immobilizer was applied.     Postoperative Plan: -The patient will begin the reverse shoulder rehab protocol for fracture, she will be nonweightbearing for the initial 4 weeks -Aspirin 325 mg daily will be used for 4 weeks for blood clot prevention -I will see  the patient back in 2 weeks for first postoperative wound check    Yevonne Pax, MD 10:54 AM

## 2022-07-12 NOTE — Anesthesia Postprocedure Evaluation (Signed)
Anesthesia Post Note  Patient: Lacey [name]  Procedure(s) Performed: REVERSE SHOULDER ARTHROPLASTY (Right: Shoulder)     Patient location during evaluation: PACU Anesthesia Type: General Level of consciousness: sedated and patient cooperative Pain management: pain level controlled Vital Signs Assessment: post-procedure vital signs reviewed and stable Respiratory status: spontaneous breathing Cardiovascular status: stable Anesthetic complications: no   No notable events documented.  Last Vitals:  Vitals:   07/12/22 1215 07/12/22 1255  BP: (!) 175/50 (!) 168/52  Pulse: 77 75  Resp: 17 18  Temp: 36.8 C 36.6 C  SpO2: 92% 96%    Last Pain:  Vitals:   07/12/22 1255  TempSrc: Oral  PainSc:                  Nolon Nations

## 2022-07-12 NOTE — Progress Notes (Signed)
Patient had 4 bts of V. Tach non-sustained, patient A&O, and denied any symptom, on-call provider notified, no new order. We continue to monitor.

## 2022-07-13 DIAGNOSIS — S42291A Other displaced fracture of upper end of right humerus, initial encounter for closed fracture: Secondary | ICD-10-CM | POA: Diagnosis not present

## 2022-07-13 LAB — GLUCOSE, CAPILLARY
Glucose-Capillary: 201 mg/dL — ABNORMAL HIGH (ref 70–99)
Glucose-Capillary: 254 mg/dL — ABNORMAL HIGH (ref 70–99)
Glucose-Capillary: 216 mg/dL — ABNORMAL HIGH (ref 70–99)
Glucose-Capillary: 363 mg/dL — ABNORMAL HIGH (ref 70–99)

## 2022-07-13 MED ORDER — ACETAMINOPHEN 500 MG PO TABS
1000.0000 mg | ORAL_TABLET | Freq: Three times a day (TID) | ORAL | Status: DC
  Administered 2022-07-13 – 2022-07-14 (×3): 1000 mg via ORAL
  Filled 2022-07-13 (×3): qty 2

## 2022-07-13 NOTE — Plan of Care (Signed)
  Problem: Education: Goal: Knowledge of General Education information will improve Description Including pain rating scale, medication(s)/side effects and non-pharmacologic comfort measures Outcome: Progressing   

## 2022-07-13 NOTE — Inpatient Diabetes Management (Signed)
Inpatient Diabetes Program Recommendations  AACE/ADA: New Consensus Statement on Inpatient Glycemic Control (2015)  Target Ranges:  Prepandial:   less than 140 mg/dL      Peak postprandial:   less than 180 mg/dL (1-2 hours)      Critically ill patients:  140 - 180 mg/dL    Latest Reference Range & Units 07/12/22 07:05 07/12/22 12:50 07/12/22 16:41 07/12/22 21:03  Glucose-Capillary 70 - 99 mg/dL 150 (H)    5 mg Decadron '@0901'$  181 (H)  13 units Novolog '@1357'$  308 (H)  21 units Novolog  251 (H)    30 units Semglee '@2130'$   (H): Data is abnormally high  Latest Reference Range & Units 07/13/22 07:36 07/13/22 12:22  Glucose-Capillary 70 - 99 mg/dL 201 (H)  Novolog HELD this AM 254 (H)  (H): Data is abnormally high      Home DM Meds: Lantus 60 units QHS      Novolog 25 units TID      Dexcom G6 CGM    Current Orders: Semglee 30 units QHS     Novolog 0-15 units TID     Novolog 10 units TID with meals   Of note, pt got 5 mg Decadron X 1 dose yest at 9am for Sugery  CBGs were elevated last PM and again this AM likely due to the Decadron  MD- May consider:  Increase Semglee slightly to 35 units QHS    --Will follow patient during hospitalization--  Wyn Quaker RN, MSN, [facility] Coordinator Inpatient Glycemic Control Team Team Pager: [phone] (8a-5p)

## 2022-07-13 NOTE — Evaluation (Signed)
Occupational Therapy Evaluation Patient Details Name: Lacey [name] MRN: [id number] DOB: 11/29/1950 Today's Date: 07/13/2022   History of Present Illness Pt is 72 yo female presenting to ED after a mechanical fall resulting in R shoulder dislocation. Pt is currently s/p R shoulder reverse arthroplasty on 3/10. PMH: DM II, HTN, CAD, atrial tachycardia, chronic anemia, restless leg/tremor.   Clinical Impression   Pt admitted with the above diagnosis and has the deficits listed below. Pt would benefit from cont OT to increase ability to complete basic adls self post surgery as well as complete basic elbow, wrist and hand ROM to RUE.  Pt has husband that can assist at home as needed. Pt has some tremors that seem to be worse due to medication being given at different time in hospital which makes ambulating slightly unsteady. Pt may need to progress further therapy (as surgeon orders) as a [place] patient as husband does not drive.  Will see one more visit to ensure understanding of techniques and exercises.      Recommendations for follow up therapy are one component of a multi-disciplinary discharge planning process, led by the attending physician.  Recommendations may be updated based on patient status, additional functional criteria and insurance authorization.   Follow Up Recommendations  Follow physician's recommendations for discharge plan and follow up therapies     Assistance Recommended at Discharge Intermittent Supervision/Assistance  Patient can return home with the following A little help with bathing/dressing/bathroom;Assistance with cooking/housework;Assist for transportation;Help with stairs or ramp for entrance    Functional Status Assessment  Patient has had a recent decline in their functional status and demonstrates the ability to make significant improvements in function in a reasonable and predictable amount of time.  Equipment Recommendations  None recommended by OT     Recommendations for Other Services       Precautions / Restrictions Precautions Precautions: Fall Restrictions Weight Bearing Restrictions: Yes RUE Weight Bearing: Non weight bearing      Mobility Bed Mobility Overal bed mobility: Needs Assistance Bed Mobility: Supine to Sit     Supine to sit: Min assist     General bed mobility comments: Pt was Min A for the trunk for supine to sitting. Pt has an adjustable bed at home.    Transfers Overall transfer level: Needs assistance Equipment used: 1 person hand held assist Transfers: Sit to/from Stand, Bed to chair/wheelchair/BSC Sit to Stand: Min assist     Step pivot transfers: Min guard     General transfer comment: Pt was Min A initially on standing due to tremors in the bil LE. Min guard for transfer with stepping to recliner from EOB with intermittent tremors in the bil LE. Pt was not using an AD prior to hospitalization.      Balance Overall balance assessment: Needs assistance Sitting-balance support: Bilateral upper extremity supported Sitting balance-Leahy Scale: Normal Sitting balance - Comments: no issues sitting EOB or in recliner.   Standing balance support: Single extremity supported Standing balance-Leahy Scale: Fair Standing balance comment: Min A to Min gaurd for balance due to bil LE tremors.                           ADL either performed or assessed with clinical judgement   ADL Overall ADL's : Needs assistance/impaired Eating/Feeding: Set up;Sitting   Grooming: Minimal assistance;Standing;Cueing for compensatory techniques   Upper Body Bathing: Minimal assistance;Sitting   Lower Body Bathing: Minimal assistance;Sit to/from stand;Cueing for  compensatory techniques   Upper Body Dressing : Moderate assistance;Sitting Upper Body Dressing Details (indicate cue type and reason): assist with technique as well as sling Lower Body Dressing: Moderate assistance;Sit to/from stand;Cueing for  compensatory techniques Lower Body Dressing Details (indicate cue type and reason): Pt fatigued and in a lot of pain to practice LE dressing.  Pt has the mobility to dress self but assist needed due to fatigue. Toilet Transfer: Minimal assistance;Comfort height toilet;Ambulation Toilet Transfer Details (indicate cue type and reason): Pt walked to bathroom with min assist. Feel pt may benefit from cane. pt has one at home Toileting- Water quality scientist and Hygiene: Minimal assistance;Sit to/from stand;Cueing for compensatory techniques Toileting - Clothing Manipulation Details (indicate cue type and reason): assist to pull up pull up only     Functional mobility during ADLs: Minimal assistance General ADL Comments: Pt unsteady on feet due to tremor meds being off. Pt most limited with UE adls due to pain but all techniques have been reviewed for bathing and dressing.     Vision Baseline Vision/History: 1 Wears glasses Ability to See in Adequate Light: 0 Adequate Patient Visual Report: No change from baseline Vision Assessment?: No apparent visual deficits     Perception     Praxis      Pertinent Vitals/Pain Pain Assessment Pain Assessment: Faces Faces Pain Scale: Hurts whole lot Pain Location: R shoulder Pain Descriptors / Indicators: Aching, Sharp Pain Intervention(s): Limited activity within patient's tolerance, Monitored during session, Repositioned, Premedicated before session     Hand Dominance Right   Extremity/Trunk Assessment Upper Extremity Assessment Upper Extremity Assessment: RUE deficits/detail RUE Deficits / Details: No ROM to R shoulder due to surgery. ROM to elbow, wrist and hand RUE: Unable to fully assess due to immobilization;Shoulder pain at rest RUE Coordination: decreased gross motor   Lower Extremity Assessment Lower Extremity Assessment: Defer to PT evaluation   Cervical / Trunk Assessment Cervical / Trunk Assessment: Normal   Communication  Communication Communication: No difficulties   Cognition Arousal/Alertness: Awake/alert Behavior During Therapy: WFL for tasks assessed/performed Overall Cognitive Status: Within Functional Limits for tasks assessed                                       General Comments  Pt instructed in all adl techniques, how to donn and doff sling, elbow, wrist and hand exercises, mobility concerns with shoulder. Pt states she understands.    Exercises Exercises: Other exercises Other Exercises Other Exercises: general PROM to R elbow, wrist and hand to control swelling in operated arm.   Shoulder Instructions      Home Living Family/patient expects to be discharged to:: Private residence Living Arrangements: Spouse/significant other Available Help at Discharge: Family;Available PRN/intermittently Type of Home: House Home Access: Ramped entrance     Home Layout: One level     Bathroom Shower/Tub: Occupational psychologist: Standard Bathroom Accessibility: Yes   Home Equipment: Conservation officer, nature (2 wheels);Shower seat;Grab bars - tub/shower;Cane - single point   Additional Comments: adjustable bed.      Prior Functioning/Environment Prior Level of Function : Independent/Modified Independent;Driving             Mobility Comments: Pt has LE tremors that are usually managed by medication ADLs Comments: Pt was independent prior to hospitalization        OT Problem List: Decreased strength;Decreased range of motion;Decreased activity tolerance;Impaired balance (  sitting and/or standing);Decreased knowledge of precautions;Impaired UE functional use;Pain      OT Treatment/Interventions: Self-care/ADL training;Therapeutic exercise;Patient/family education    OT Goals(Current goals can be found in the care plan section) Acute Rehab OT Goals Patient Stated Goal: to get home soon OT Goal Formulation: With patient Time For Goal Achievement: 07/27/22 Potential to  Achieve Goals: Good ADL Goals Pt Will Perform Upper Body Dressing: with supervision;sitting Additional ADL Goal #1: Pt will walk to bathroom and complete toileting with supervision. Additional ADL Goal #2: Pt will demonstrate ability to remove/donn sling and do rom to R elbow, wrist and hand only to prevent swelling without assist.  OT Frequency: Min 2X/week    Co-evaluation              AM-PAC OT "6 Clicks" Daily Activity     Outcome Measure Help from another person eating meals?: A Little Help from another person taking care of personal grooming?: A Little Help from another person toileting, which includes using toliet, bedpan, or urinal?: A Little Help from another person bathing (including washing, rinsing, drying)?: A Little Help from another person to put on and taking off regular upper body clothing?: A Lot Help from another person to put on and taking off regular lower body clothing?: A Little 6 Click Score: 17   End of Session Nurse Communication: Mobility status;Weight bearing status  Activity Tolerance: Patient limited by pain Patient left: in chair;with call bell/phone within reach  OT Visit Diagnosis: Unsteadiness on feet (R26.81);Pain Pain - Right/Left: Right Pain - part of body: Shoulder                Time: 1230-1305 OT Time Calculation (min): 35 min Charges:  OT General Charges $OT Visit: 1 Visit OT Evaluation $OT Eval Moderate Complexity: 1 Mod OT Treatments $Self Care/Home Management : 8-22 mins  Glenford Peers 07/13/2022, 1:20 PM

## 2022-07-13 NOTE — TOC CAGE-AID Note (Signed)
Transition of Care [facility]) - CAGE-AID Screening   Patient Details  Name: Lacey [name] MRN: [id number] Date of Birth: [birth year]  Elvina Sidle, RN Trauma Response Nurse Phone Number: [phone] 07/13/2022, 11:36 AM   Clinical Narrative:    Pt sitting up in chair next to bed. Denies any alcohol or drug use. Primary Care MD was in room, stated that pt is requesting pain med. Request passed on to floor nurse.     CAGE-AID Screening:    Have You Ever Felt You Ought to Cut Down on Your Drinking or Drug Use?: No Have People Annoyed You By Critizing Your Drinking Or Drug Use?: No Have You Felt Bad Or Guilty About Your Drinking Or Drug Use?: No Have You Ever Had a Drink or Used Drugs First Thing In The Morning to Steady Your Nerves or to Get Rid of a Hangover?: No CAGE-AID Score: 0  Substance Abuse Education Offered: No (Pt denies any alcohol/illicit drug use.)

## 2022-07-13 NOTE — Evaluation (Signed)
Physical Therapy Evaluation Patient Details Name: Lacey [name] MRN: [id number] DOB: 1950/06/30 Today's Date: 07/13/2022  History of Present Illness  Pt is 72 yo female presenting to ED after a mechanical fall resulting in R shoulder dislocation. Pt is currently s/p R shoulder reverse arthroplasty on 3/10. PMH: DM II, HTN, CAD, atrial tachycardia, chronic anemia, restless leg/tremor.  Clinical Impression  Pt currently is below baseline. She states that usually her LE tremors are managed by medication but she has been getting it on a difference schedule in the hospital which has increased tremors. Pt tremors are effecting her balance at this time. Overall pt strength is [facility] for functional mobility. Pt was able to perform bed mobility, transfers and short distance gait at Min A to CGA due to current functional status and is unable to use a RW at this time secondary to WB restrictions through the RUE. Pt may benefit from use of quad cane or hemi walker until her medications can get her LE tremor back to her baseline. Due to pt current functional status, home set up and available assistance at home recommending HHPT on discharge from acute care hospital setting in order to decrease risk for falls, injury and re-hospitalization and to protect the integrity of recent surgery.        Recommendations for follow up therapy are one component of a multi-disciplinary discharge planning process, led by the attending physician.  Recommendations may be updated based on patient status, additional functional criteria and insurance authorization.  Follow Up Recommendations Home health PT      Assistance Recommended at Discharge Intermittent Supervision/Assistance  Patient can return home with the following  A little help with walking and/or transfers;Assistance with cooking/housework;Assist for transportation;Help with stairs or ramp for entrance    Equipment Recommendations None recommended by PT  Recommendations  for Other Services       Functional Status Assessment Patient has had a recent decline in their functional status and demonstrates the ability to make significant improvements in function in a reasonable and predictable amount of time.     Precautions / Restrictions Precautions Precautions: Fall Restrictions Weight Bearing Restrictions: Yes RUE Weight Bearing: Non weight bearing      Mobility  Bed Mobility Overal bed mobility: Needs Assistance Bed Mobility: Supine to Sit     Supine to sit: Min assist     General bed mobility comments: Pt was Min A for the trunk for supine to sitting. Pt has an adjustable bed at home. Patient Response: Cooperative  Transfers Overall transfer level: Needs assistance Equipment used: 1 person hand held assist Transfers: Sit to/from Stand, Bed to chair/wheelchair/BSC Sit to Stand: Min assist   Step pivot transfers: Min guard       General transfer comment: Pt was Min A initially on standing due to tremors in the bil LE. Min guard for transfer with stepping to recliner from EOB with intermittent tremors in the bil LE. Pt was not using an AD prior to hospitalization.    Ambulation/Gait Ambulation/Gait assistance: Min guard Gait Distance (Feet): 15 Feet Assistive device: 1 person hand held assist Gait Pattern/deviations: Step-through pattern, Decreased stride length Gait velocity: Slow cadence. Gait velocity interpretation: <1.31 ft/sec, indicative of household ambulator   General Gait Details: Decreased step length bil with step through to partial step through gait pattern.      Balance Overall balance assessment: Needs assistance Sitting-balance support: Bilateral upper extremity supported Sitting balance-Leahy Scale: Normal Sitting balance - Comments: no issues sitting EOB  or in recliner.   Standing balance support: Single extremity supported Standing balance-Leahy Scale: Fair Standing balance comment: Min A to Min gaurd for  balance due to bil LE tremors.         Pertinent Vitals/Pain Pain Assessment Pain Assessment: Faces Faces Pain Scale: Hurts whole lot Pain Location: R shoulder Pain Descriptors / Indicators: Aching, Sharp Pain Intervention(s): Monitored during session    Home Living Family/patient expects to be discharged to:: Private residence Living Arrangements: Spouse/significant other (takes care of spouse. He does not need physical assistance.) Available Help at Discharge: Family;Available PRN/intermittently Type of Home: House Home Access: Ramped entrance       Home Layout: One level Home Equipment: Conservation officer, nature (2 wheels);Shower seat;Grab bars - tub/shower;Cane - single point Additional Comments: adjustable bed.    Prior Function Prior Level of Function : Independent/Modified Independent;Driving             Mobility Comments: Pt has LE tremors that are usually managed by medication ADLs Comments: Pt was independent prior to hospitalization     Hand Dominance   Dominant Hand: Right    Extremity/Trunk Assessment   Upper Extremity Assessment Upper Extremity Assessment: Defer to OT evaluation    Lower Extremity Assessment Lower Extremity Assessment: Overall WFL for tasks assessed (pt has tremors in bil LE)       Communication   Communication: No difficulties  Cognition Arousal/Alertness: Awake/alert Behavior During Therapy: WFL for tasks assessed/performed Overall Cognitive Status: Within Functional Limits for tasks assessed      General Comments General comments (skin integrity, edema, etc.): Pt is doing well with mobility. She cares for her spouse at home but states that while she has been in the hospital her kids have stepped up and will be able to continue to assist once she returns home.        Assessment/Plan    PT Assessment Patient needs continued PT services  PT Problem List Decreased mobility;Decreased balance       PT Treatment Interventions DME  instruction;Therapeutic activities;Gait training;Therapeutic exercise;Patient/family education;Balance training;Functional mobility training;Neuromuscular re-education    PT Goals (Current goals can be found in the Care Plan section)  Acute Rehab PT Goals Patient Stated Goal: To go home and continue to support her spouse and get back to her dog. PT Goal Formulation: With patient Time For Goal Achievement: 07/27/22 Potential to Achieve Goals: Good    Frequency Min 3X/week        AM-PAC PT "6 Clicks" Mobility  Outcome Measure Help needed turning from your back to your side while in a flat bed without using bedrails?: A Little Help needed moving from lying on your back to sitting on the side of a flat bed without using bedrails?: A Little Help needed moving to and from a bed to a chair (including a wheelchair)?: A Little Help needed standing up from a chair using your arms (e.g., wheelchair or bedside chair)?: A Little Help needed to walk in hospital room?: A Little Help needed climbing 3-5 steps with a railing? : A Lot 6 Click Score: 17    End of Session Equipment Utilized During Treatment: Gait belt Activity Tolerance: Patient tolerated treatment well Patient left: in chair;with call bell/phone within reach Nurse Communication: Mobility status PT Visit Diagnosis: Unsteadiness on feet (R26.81);Other abnormalities of gait and mobility (R26.89)    Time: [id number] PT Time Calculation (min) (ACUTE ONLY): 31 min   Charges:   PT Evaluation $PT Eval Low Complexity: 1 Low PT Treatments $  Therapeutic Activity: 8-22 mins       Tomma Rakers, DPT, CLT  Acute Rehabilitation Services Office: [phone] (Secure chat preferred)   Ander Purpura 07/13/2022, 11:55 AM

## 2022-07-13 NOTE — Progress Notes (Signed)
   Subjective:  Patient reports pain as moderate this AM. Controlled with medication. Tolerating diet.  Objective:   VITALS:   Vitals:   07/12/22 1255 07/12/22 1637 07/12/22 2006 07/13/22 0506  BP: (!) 168/52 (!) 157/44 (!) 158/37 (!) 117/53  Pulse: 75 63 71 84  Resp: 18 17 17 20   Temp: 97.8 F (36.6 C) 99.1 F (37.3 C) 98.7 F (37.1 C) 98 F (36.7 C)  TempSrc: Oral Oral Oral   SpO2: 96% 99% 94% 100%  Weight:      Height:        Right arm block partially in effect. Fires EPL, wrist extensor, 2+ RP strongly   Lab Results  Component Value Date   WBC 8.0 07/12/2022   HGB 11.4 (L) 07/12/2022   HCT 34.8 (L) 07/12/2022   MCV 95.6 07/12/2022   PLT 153 07/12/2022     Assessment/Plan:  1 Day Post-Op status post right shoulder reverse for fracture  - Expected postop acute blood loss anemia - will monitor for symptoms - Patient to work with PT/OT to optimize mobilization safely - DVT ppx - SCDs, ambulation, Aspirin 325mg  daily - NWB operative extremity - Pain control - multimodal pain management, ATC acetaminophen in conjunction with as needed narcotic (oxycodone), although this should be minimized with other modalities  - Discharge planning pending CM, okay for discharge if patient okay from mobility perspective   [place] 07/13/2022, 7:21 AM

## 2022-07-13 NOTE — Progress Notes (Signed)
PROGRESS NOTE  Lacey [name]  DOB: May 30, 1950  PCP: Midge Minium, MD [id number]  DOA: 07/11/2022  LOS: 0 days  Hospital Day: 3  Brief narrative: Lacey [name] is a 72 y.o. female with PMH significant for DM2, HTN, nonobstructive CAD, atrial tachycardia, chronic anemia, restless leg/tremor 3/9, patient was brought to the ED after a mechanical fall resulting in right shoulder dislocation.   Patient states she was walking her dog in the rain when she slipped on the ramp and fell onto her right side. Immediately after the fall she had severe right shoulder pain and rolled over onto her back.  She was on the floor for some time as her husband could not hear her.  Eventually got up and presented to the ED due to severe pain.  In the ED patient was hemodynamically stable WBC slightly elevated to 13.4. X-ray right shoulder showed comminuted fracture of the proximal humerus with inferomedial dislocation of the humeral head, which rests against the subscapularis muscle. The humeral diaphysis is superiorly subluxed into the glenoid fossa.   Seen by orthopedics. Patient underwent closed reduction in the ED and plan for surgery in a.m. Admitted to [facility] 3/10, underwent right shoulder reverse shoulder arthroplasty  Subjective: Patient was seen and examined this morning Sitting up in recliner.  She was feeling that the nerve block was waiting and was asking for pain medicines. Family not at bedside.  Patient does not feel comfortable going home today because of the degree of pain.  Assessment and plan: Right displaced proximal humeral fracture with significant displacement S/p right shoulder reverse arthroplasty -Dr. Sammuel Hines 3/10 Secondary to mechanical fall.   Imaging as above.  Orthopedics consulted.  Surgery done It seems patient needs better pain control.  I will start her on a scheduled Tylenol 3 times daily, and as needed oxycodone and IV morphine.  Continue monitor pain for next 24  hours. PT/OT eval obtained. Orthopedics has started DVT prophylaxis with aspirin 325 mg daily.  Type 2 diabetes mellitus A1c 6.7 on 05/05/2022 PTA on Lantus 30 units nightly, Premeal NovoLog 25 units 3 times daily. Currently continued on Semglee 30 units nightly, NovoLog 10 units scheduled 3 times daily Premeal and sliding scale insulin Blood sugar control is satisfactory.  Continue same plan. Recent Labs  Lab 07/12/22 1250 07/12/22 1641 07/12/22 2103 07/13/22 0736 07/13/22 1222  GLUCAP 181* 308* 251* 201* 254*   Chronic diastolic CHF Essential hypertension Continue Lasix 20 mg daily, valsartan 320 mg daily, verapamil 120 mg daily Continue verapamil.  I will hold Lasix and ARB perioperatively.  Nonobstructive CAD HLD Continue beta-blocker, fenofibrate daily.  Previously on aspirin 81 mg daily.  Currently on hold while on DVT prophylaxis with aspirin 325 mg daily.  Hypothyroidism Synthroid  Morbid Obesity  Body mass index is 42.06 kg/m. Patient has been advised to make an attempt to improve diet and exercise patterns to aid in weight loss.  OSA  Chronic smoker Counseled to quit.  Restless legs Continue Neurontin 90 mg 3 times daily, primidone  Anxiety/depression Continue Klonopin 1 mg daily as needed   Mobility: PT eval obtained.  Home with PT recommended.  Goals of care   Code Status: Full Code     DVT prophylaxis:  Place and maintain sequential compression device Start: 07/11/22 1454 SCDs Start: 07/11/22 1441  Antimicrobials: Perioperative antibiotics Fluid: None Consultants: Orthopedics Family Communication: None at bedside  Status: Observation Level of care:  Telemetry Medical   Needs to continue in-hospital care:  Pain control is not adequate.  Requiring IV Dilaudid as needed.  Patient does not feel comfortable going home today.  Prognosis:  Guarded prognosis given advanced age, nature of fracture and the degree of pain  Patient from:  Home Anticipated d/c to: Hopefully home tomorrow   Scheduled Meds:  sodium chloride   Intravenous Once   acetaminophen  1,000 mg Oral TID   aspirin  325 mg Oral Daily   atorvastatin  20 mg Oral Daily   calcium-vitamin D  1 tablet Oral BID   cholecalciferol  1,000 Units Oral Daily   fenofibrate  160 mg Oral Daily   gabapentin  900 mg Oral TID   insulin aspart  0-15 Units Subcutaneous TID WC   insulin aspart  10 Units Subcutaneous TID WC   insulin glargine-yfgn  30 Units Subcutaneous QHS   ipratropium-albuterol  3 mL Nebulization Once   levothyroxine  50 mcg Oral Q0600   mirabegron ER  25 mg Oral Daily   pantoprazole  20 mg Oral Daily   primidone  100 mg Oral Daily   primidone  50 mg Oral QHS   traZODone  100 mg Oral QHS   verapamil  120 mg Oral Daily    PRN meds: clonazePAM, hydrALAZINE, ipratropium-albuterol, morphine injection, ondansetron **OR** ondansetron (ZOFRAN) IV, oxyCODONE   Infusions:     Diet:  Diet Order             Diet regular Room service appropriate? Yes; Fluid consistency: Thin  Diet effective now                   Antimicrobials: Anti-infectives (From admission, onward)    Start     Dose/Rate Route Frequency Ordered Stop   07/12/22 1400  ceFAZolin (ANCEF) IVPB 2g/100 mL premix        2 g 200 mL/hr over 30 Minutes Intravenous Every 8 hours 07/12/22 1219 07/12/22 2152   07/12/22 0907  vancomycin (VANCOCIN) powder  Status:  Discontinued          As needed 07/12/22 0908 07/12/22 1112   07/12/22 0750  ceFAZolin (ANCEF) 2-4 GM/100ML-% IVPB       Note to Pharmacy: [facility] [place]: cabinet override      07/12/22 0750 07/12/22 1959       Skin assessment:       Nutritional status:  Body mass index is 42.06 kg/m.          Objective: Vitals:   07/13/22 0506 07/13/22 0800  BP: (!) 117/53 (!) 162/49  Pulse: 84 71  Resp: 20 16  Temp: 98 F (36.7 C) 99.5 F (37.5 C)  SpO2: 100% (!) 89%    Intake/Output Summary (Last 24 hours)  at 07/13/2022 1431 Last data filed at 07/12/2022 2258 Gross per 24 hour  Intake 360 ml  Output 1350 ml  Net -990 ml   Filed Weights   07/11/22 0725 07/12/22 0708  Weight: 104.3 kg 104.3 kg   Weight change: -0 kg Body mass index is 42.06 kg/m.   Physical Exam: General exam: Pleasant elderly Caucasian female.  In mild to moderate distress because of pain. Skin: No rashes, lesions or ulcers. HEENT: Atraumatic, normocephalic, no obvious bleeding Lungs: Clear to auscultation bilaterally CVS: Regular rate and rhythm, no murmur GI/Abd soft, nontender, nondistended, bowels are present CNS: Alert, awake, oriented x 3 Psychiatry: Mood affected by pain Extremities: No pedal edema, no calf tenderness.  Data Review: I have personally reviewed the laboratory data and  studies available.  F/u labs ordered Unresulted Labs (From admission, onward)    None       Total time spent in review of labs and imaging, patient evaluation, formulation of plan, documentation and communication with family: 59 minutes  Signed, Terrilee Croak, MD [facility] 07/13/2022

## 2022-07-13 NOTE — Progress Notes (Addendum)
..  Trauma Event Note    Rounded on pt for cage aid inquiry. Pt sleeping soundly in bed, no visitors present. Pt not disturbed at this time as pain control has appeared to be achieved s/p reverse shoulder arthroplasty today. Cage aid survey to be completed at another time.    Last imported Vital Signs BP (!) 158/37 (BP Location: Left Arm)   Pulse 71   Temp 98.7 F (37.1 C) (Oral)   Resp 17   Ht 5' 2.01" (1.575 m)   Wt 230 lb (104.3 kg)   LMP 06/06/1993   SpO2 94%   BMI 42.06 kg/m   Trending CBC Recent Labs    07/11/22 0841 07/12/22 0343  WBC 13.4* 8.0  HGB 13.4 11.4*  HCT 41.0 34.8*  PLT 170 153    Trending Coag's No results for input(s): "APTT", "INR" in the last 72 hours.  Trending BMET Recent Labs    07/11/22 0841 07/12/22 0343  NA 139 136  K 4.2 3.9  CL 106 103  CO2 24 24  BUN 10 12  CREATININE 0.83 0.96  GLUCOSE 154* 155*      Morine Kohlman Dee  Trauma Response RN  Please call TRN at [phone] for further assistance.

## 2022-07-13 NOTE — TOC Initial Note (Signed)
Transition of Care (TOC) - Initial/Assessment Note   Patient from home with husband.   PT recommending HHPT.   Discussed HHPT/OT with patient. No preference.   Tommi Rumps with [facility] accepted referral   Will need signed orders messaged MD  Patient Details  Name: Lacey [name] MRN: [id number] Date of Birth: 26-Jun-1950  Transition of Care [facility]) CM/SW Contact:    Marilu Favre, RN Phone Number: 07/13/2022, 2:04 PM  Clinical Narrative:                   Expected Discharge Plan: Home w Home Health Services Barriers to Discharge: Other (must enter comment) (pain control)   Patient Goals and CMS Choice Patient states their goals for this hospitalization and ongoing recovery are:: to return to home CMS Medicare.gov Compare Post Acute Care list provided to:: Patient Choice offered to / list presented to : Patient [place] ownership interest in [facility].provided to:: Patient    Expected Discharge Plan and Services   Discharge Planning Services: CM Consult Post Acute Care Choice: [place] arrangements for the past 2 months: Single Family Home                 DME Arranged: N/A DME Agency: NA       HH Arranged: PT, OT HH Agency: [place] Date [place]: 07/13/22 Time [place]: 1403 Representative spoke with at [place]: Tommi Rumps  Prior Living Arrangements/Services Living arrangements for the past 2 months: [place] Lives with:: Spouse Patient language and need for interpreter reviewed:: Yes Do you feel safe going back to the place where you live?: Yes      Need for Family Participation in Patient Care: Yes (Comment) Care giver support system in place?: Yes (comment)   Criminal Activity/Legal Involvement Pertinent to Current Situation/Hospitalization: No - Comment as needed  Activities of Daily Living      Permission Sought/Granted   Permission granted to share information with : No               Emotional Assessment Appearance:: Appears stated age Attitude/Demeanor/Rapport: Engaged Affect (typically observed): Accepting Orientation: : Oriented to Self, Oriented to Place, Oriented to  Time, Oriented to Situation Alcohol / Substance Use: Not Applicable Psych Involvement: No (comment)  Admission diagnosis:  Shoulder dislocation [S43.006A] Closed displaced comminuted fracture of shaft of right humerus, initial encounter [S42.351A] Fall, initial encounter [W19.XXXA] Dislocation of right shoulder joint, initial encounter [S43.004A] Patient Active Problem List   Diagnosis Date Noted   Closed 3-part fracture of proximal end of right humerus 07/11/2022   Shoulder dislocation 07/11/2022   Abnormal stress test    Atypical chest pain 08/05/2021   Bradycardia 06/20/2021   Abnormal gait 03/17/2021   Acquired deformity of lower leg 03/17/2021   Urinary urgency 11/14/2020   Paresthesia 08/15/2020   Right lumbar pain 08/15/2020   Tremor 08/15/2020   Hypoglycemia associated with diabetes ([place]) 02/29/2020   Essential (primary) hypertension 08/10/2019   Chronic neck pain 06/23/2016   Radicular low back pain 07/12/2015   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 without macular edema ([place]) 04/24/2015   Severe obesity (BMI >= 40) ([place]) 02/19/2015   OSA (obstructive sleep apnea) 10/23/2014   Excessive daytime sleepiness 10/23/2014   Hypothyroidism 09/07/2014   Tobacco abuse 01/15/2014   Recurrent UTI 09/06/2013   Orthostatic hypotension 10/05/2012   General medical examination 06/22/2011   Atrial tachycardia 11/25/2010  Pre-operative cardiovascular examination 11/25/2010   Chronic diarrhea 08/25/2010   KNEE PAIN 07/14/2010   Knee pain [id number]   DIASTOLIC HEART FAILURE, CHRONIC 06/09/2010   PERSONAL HISTORY OF COLONIC POLYPS 05/01/2010   Vitamin D deficiency 01/20/2010   RESTLESS LEG SYNDROME 01/20/2010   Insomnia 01/20/2010    Restless leg syndrome 01/20/2010   Hyperlipidemia associated with type 2 diabetes mellitus ([place]) 01/08/2009   Anemia 01/08/2009   GERD 01/08/2009   Benign hypertensive heart disease with heart failure ([place]) 01/08/2009   PCP:  Midge Minium, MD Pharmacy[facility]   [id number]: [phone]: [phone]    Social Determinants of Health (SDOH) Social History: SDOH Screenings   Food Insecurity: No Food Insecurity (01/08/2022)  Housing: Low Risk  (01/08/2022)  Transportation Needs: No Transportation Needs (01/08/2022)  Utilities: Not At Risk (01/08/2022)  Alcohol Screen: Low Risk  (01/08/2022)  Depression (PHQ2-9): Low Risk  (05/05/2022)  Financial Resource Strain: Low Risk  (01/08/2022)  Physical Activity: Insufficiently Active (01/08/2022)  Social Connections: Moderately Integrated (01/08/2022)  Stress: No Stress Concern Present (01/08/2022)  Tobacco Use: High Risk (07/12/2022)   SDOH Interventions:     Readmission Risk Interventions     No data to display

## 2022-07-14 ENCOUNTER — Other Ambulatory Visit (HOSPITAL_COMMUNITY): Payer: Self-pay

## 2022-07-14 DIAGNOSIS — S42291A Other displaced fracture of upper end of right humerus, initial encounter for closed fracture: Secondary | ICD-10-CM | POA: Diagnosis not present

## 2022-07-14 LAB — GLUCOSE, CAPILLARY
Glucose-Capillary: 264 mg/dL — ABNORMAL HIGH (ref 70–99)
Glucose-Capillary: 278 mg/dL — ABNORMAL HIGH (ref 70–99)

## 2022-07-14 MED ORDER — ASPIRIN 325 MG PO TABS
325.0000 mg | ORAL_TABLET | Freq: Every day | ORAL | 0 refills | Status: AC
  Filled 2022-07-14: qty 28, 28d supply, fill #0

## 2022-07-14 MED ORDER — OXYCODONE HCL 5 MG PO TABS
5.0000 mg | ORAL_TABLET | Freq: Four times a day (QID) | ORAL | 0 refills | Status: AC | PRN
  Filled 2022-07-14: qty 28, 7d supply, fill #0

## 2022-07-14 MED ORDER — ASPIRIN EC 81 MG PO TBEC: 81.0000 mg | DELAYED_RELEASE_TABLET | Freq: Every day | ORAL | 11 refills | Status: DC

## 2022-07-14 MED ORDER — ACETAMINOPHEN 500 MG PO TABS
1000.0000 mg | ORAL_TABLET | Freq: Three times a day (TID) | ORAL | 0 refills | Status: DC
  Filled 2022-07-14: qty 30, 5d supply, fill #0

## 2022-07-14 MED ORDER — INSULIN ASPART 100 UNIT/ML IJ SOLN
15.0000 [IU] | Freq: Three times a day (TID) | INTRAMUSCULAR | Status: DC
  Administered 2022-07-14: 15 [IU] via SUBCUTANEOUS

## 2022-07-14 NOTE — Progress Notes (Signed)
Physical Therapy Treatment Patient Details Name: Lacey [name] MRN: [id number] DOB: 11-14-1950 Today's Date: 07/14/2022   History of Present Illness Pt is 72 yo female presenting to ED after a mechanical fall resulting in R shoulder dislocation. Pt is currently s/p R shoulder reverse arthroplasty on 3/10. PMH: DM II, HTN, CAD, atrial tachycardia, chronic anemia, restless leg/tremor.    PT Comments    Pt was received in supine and agreeable to session with daughter present throughout for family training. Pt required min A and cues for technique for bed mobility due to difficulty rolling without the bedrail. Pt was instructed in stair trial to provide option for alternate entrance to home due to injury occurring on ramped entrance. Pt was able to demo safe technique with min guard for safety. Pt able to increase gait distance with 1 LOB requiring mod A to correct. Pt continues to benefit from PT services to progress toward functional mobility goals.    Recommendations for follow up therapy are one component of a multi-disciplinary discharge planning process, led by the attending physician.  Recommendations may be updated based on patient status, additional functional criteria and insurance authorization.  Follow Up Recommendations  Home health PT     Assistance Recommended at Discharge Intermittent Supervision/Assistance  Patient can return home with the following A little help with walking and/or transfers;Assistance with cooking/housework;Assist for transportation;Help with stairs or ramp for entrance   Equipment Recommendations  None recommended by PT    Recommendations for Other Services       Precautions / Restrictions Precautions Precautions: Fall;Other (comment) Precaution Comments: Reverse Right Shoulder - Active ROM only to elbow, wrist and hand, sling at all times Required Braces or Orthoses: Sling Restrictions Weight Bearing Restrictions: Yes RUE Weight Bearing: Non weight  bearing     Mobility  Bed Mobility Overal bed mobility: Needs Assistance Bed Mobility: Supine to Sit     Supine to sit: Min assist     General bed mobility comments: Min A with HHA for trunk elevation. Increased time and cues for sequencing for BLE advancement    Transfers Overall transfer level: Needs assistance Equipment used: 1 person hand held assist Transfers: Sit to/from Stand, Bed to chair/wheelchair/BSC Sit to Stand: Min assist, Min guard   Step pivot transfers: Min guard       General transfer comment: from EOB with min A for balance due to slight dizziness progressing to min guard from recliner.    Ambulation/Gait Ambulation/Gait assistance: Min guard, Mod assist Gait Distance (Feet): 25 Feet Assistive device: 1 person hand held assist Gait Pattern/deviations: Step-through pattern, Decreased stride length Gait velocity: decreased     General Gait Details: Pt demonstrating intermittent unsteadiness with 1 LOB requiring mod A to correct.   Stairs Stairs: Yes Stairs assistance: Min guard Stair Management: One rail Left Number of Stairs: 5 General stair comments: Due to injury occuring on pt's ramped entrance, pt was instructed in stair technique for option to use alternate entrance with stairs. Pt was able to perform safe stair trial with min guard for safety and daughter present for training.      Balance Overall balance assessment: Needs assistance Sitting-balance support: Single extremity supported, Bilateral upper extremity supported, Feet supported Sitting balance-Leahy Scale: Normal Sitting balance - Comments: sitting EOB and in recliner   Standing balance support: Single extremity supported Standing balance-Leahy Scale: Fair Standing balance comment: Intermittent unsteadiness requiring up to mod A to correct  Cognition Arousal/Alertness: Awake/alert Behavior During Therapy: WFL for tasks  assessed/performed Overall Cognitive Status: Within Functional Limits for tasks assessed                                          Exercises      General Comments        Pertinent Vitals/Pain Pain Assessment Pain Assessment: Faces Faces Pain Scale: Hurts even more Pain Location: R shoulder Pain Descriptors / Indicators: Aching, Grimacing, Sharp Pain Intervention(s): Limited activity within patient's tolerance, Monitored during session, Repositioned     PT Goals (current goals can now be found in the care plan section) Acute Rehab PT Goals Patient Stated Goal: To go home and continue to support her spouse and get back to her dog. PT Goal Formulation: With patient Time For Goal Achievement: 07/27/22 Potential to Achieve Goals: Good Progress towards PT goals: Progressing toward goals    Frequency    Min 3X/week      PT Plan Current plan remains appropriate       AM-PAC PT "6 Clicks" Mobility   Outcome Measure  Help needed turning from your back to your side while in a flat bed without using bedrails?: A Little Help needed moving from lying on your back to sitting on the side of a flat bed without using bedrails?: A Little Help needed moving to and from a bed to a chair (including a wheelchair)?: A Little Help needed standing up from a chair using your arms (e.g., wheelchair or bedside chair)?: A Little Help needed to walk in hospital room?: A Little Help needed climbing 3-5 steps with a railing? : A Little 6 Click Score: 18    End of Session Equipment Utilized During Treatment: Gait belt Activity Tolerance: Patient tolerated treatment well Patient left: in chair;with call bell/phone within reach;with family/visitor present Nurse Communication: Mobility status PT Visit Diagnosis: Unsteadiness on feet (R26.81);Other abnormalities of gait and mobility (R26.89)     Time: [id number] PT Time Calculation (min) (ACUTE ONLY): 27 min  Charges:  $Gait  Training: 23-37 mins                     Michelle Nasuti, PTA Acute Rehabilitation Services Secure Chat Preferred  [phone]    Michelle Nasuti 07/14/2022, 12:51 PM

## 2022-07-14 NOTE — Progress Notes (Signed)
Pt discharged to home accomp by dtr. With TOC meds given and explained. AVS given and explained. Dtr and pt understand all DC  instructions and follow up appts. Sling is on and adjusted.

## 2022-07-14 NOTE — TOC Progression Note (Signed)
Transition of Care (TOC) - Progression Note   OT recommending 3 in 1 . Spoke to patient and daughter at bedside. Daughter has a 3 in 1 at home patient can use.   Updated Tommi Rumps with [facility] on anticipated discharge date  Patient Details  Name: Lacey [name] MRN: [id number] Date of Birth: 03/11/1951  Transition of Care [facility]) CM/SW Contact  Jacalyn Lefevre Edson Snowball, RN Phone Number: 07/14/2022, 10:31 AM  Clinical Narrative:       Expected Discharge Plan: [place] Services Barriers to Discharge: Other (must enter comment) (pain control)  Expected Discharge Plan and Services   Discharge Planning Services: CM Consult Post Acute Care Choice: [place] arrangements for the past 2 months: Single Family Home                 DME Arranged: N/A DME Agency: NA       HH Arranged: PT, OT HH Agency: [place] Date [place]: 07/13/22 Time [place]: 1403 Representative spoke with at [place]: [place] Determinants of Health ([place]) Interventions SDOH Screenings   Food Insecurity: No Food Insecurity (01/08/2022)  Housing: Low Risk  (01/08/2022)  Transportation Needs: No Transportation Needs (01/08/2022)  Utilities: Not At Risk (01/08/2022)  Alcohol Screen: Low Risk  (01/08/2022)  Depression (PHQ2-9): Low Risk  (05/05/2022)  Financial Resource Strain: Low Risk  (01/08/2022)  Physical Activity: Insufficiently Active (01/08/2022)  Social Connections: Moderately Integrated (01/08/2022)  Stress: No Stress Concern Present (01/08/2022)  Tobacco Use: High Risk (07/12/2022)    Readmission Risk Interventions     No data to display

## 2022-07-14 NOTE — Progress Notes (Signed)
Occupational Therapy Treatment Patient Details Name: Lacey [name] MRN: [id number] DOB: 27-Apr-1951 Today's Date: 07/14/2022   History of present illness Pt is 72 yo female presenting to ED after a mechanical fall resulting in R shoulder dislocation. Pt is currently s/p R shoulder reverse arthroplasty on 3/10. PMH: DM II, HTN, CAD, atrial tachycardia, chronic anemia, restless leg/tremor.   OT comments  Pt admitted as above. Pt seen for OT ADL retraining session today with focus on R UE home program, active ROM only to elbow, wrist and hand, sling wear and care - pt currently requires Min A; functional transfers to bathroom (on/off toilet), standing at sink for grooming all at Min A, 1 hand held assist level as pt appears to be unsteady on her feet secondary to bilateral LE tremors (pt states that her tremor medications are "off"/different at home). Discussed recommendation of Min A overall for safety with transfers and ADL's PRN, pt verbalized understanding. Recommend 3:1 at home or grab bars at home as pt is unable to sit/stand from toilet without use of grab bar on left. All handouts reviewed with pt. Pain cont to be limiting factor rating 7/10 R UE. Ice applied at end of session. Pt is hopeful to discharge home later today if medically able.   Recommendations for follow up therapy are one component of a multi-disciplinary discharge planning process, led by the attending physician.  Recommendations may be updated based on patient status, additional functional criteria and insurance authorization.    Follow Up Recommendations  Follow physician's recommendations for discharge plan and follow up therapies     Assistance Recommended at Discharge Intermittent Supervision/Assistance  Patient can return home with the following  A little help with bathing/dressing/bathroom;Assistance with cooking/housework;Assist for transportation;Help with stairs or ramp for entrance   Equipment Recommendations   BSC/3in1 (Recommend 3:1 at home or grab bars at home as pt is unable to sit/stand from toilet without use of grab bar on left)    Recommendations for Other Services      Precautions / Restrictions Precautions Precautions: Fall;Other (comment) Precaution Comments: Reverse Right Shoulder - Active ROM only to elbow, wrist and hand, sling at all times Required Braces or Orthoses: Sling (R UE) Restrictions Weight Bearing Restrictions: Yes RUE Weight Bearing: Non weight bearing       Mobility Bed Mobility Overal bed mobility:  (Pt received up in chair upon OT arrival)   Transfers Overall transfer level: Needs assistance Equipment used: 1 person hand held assist Transfers: Sit to/from Stand, Bed to chair/wheelchair/BSC Sit to Stand: Min assist   Step pivot transfers: Min guard, Min assist   General transfer comment: Pt  is Min A initially on standing due to tremors in the bil LE. Min assist (1 hand held assist) for transfer with ambulation from chair into bathroom and on/off toilet using grab bar on left. Pt is with noted intermittent tremors in the bil LE. Pt was not using an AD prior to hospitalization. Pt reports that she has a cane at home     Balance Overall balance assessment: Needs assistance Sitting-balance support: Single extremity supported, Bilateral upper extremity supported, Feet supported Sitting balance-Leahy Scale: Normal Sitting balance - Comments: no issues sitting in recliner and at edge of recliner.   Standing balance support: Single extremity supported Standing balance-Leahy Scale: Fair Standing balance comment: Min A to Min guard for balance due to bil LE tremors and steady assist with sit to stand noted     ADL either performed or assessed  with clinical judgement   ADL Overall ADL's : Needs assistance/impaired Eating/Feeding: Set up;Sitting   Grooming: Wash/dry hands;Wash/dry face;Minimal assistance;Standing Grooming Details (indicate cue type and  reason): Standing at sink Upper Body Bathing: Minimal assistance;Sitting   Upper Body Dressing : Minimal assistance Upper Body Dressing Details (indicate cue type and reason): assist with technique as well as sling   Toilet Transfer: Minimal assistance;Ambulation;Regular Toilet;Grab bars Toilet Transfer Details (indicate cue type and reason): Pt walked to bathroom with min assist. Feel pt may benefit from cane. pt has one at home. Pt required grab bar use for sit and standing from toilet. Toileting- Clothing Manipulation and Hygiene: Minimal assistance;Cueing for compensatory techniques Toileting - Clothing Manipulation Details (indicate cue type and reason): assist to pull up pull up only   Functional mobility during ADLs: Minimal assistance General ADL Comments: Pt unsteady on feet due to tremor meds being off. Pt most limited with UE adls due to pain reviewed post-op reverse shoulder techniques for bathing and dressing, emphasized no A/ROM to right shoulder, active ROM to elbow, wrist and hand only. Handouts issued and reviewed with pt    Extremity/Trunk Assessment Upper Extremity Assessment Upper Extremity Assessment: RUE deficits/detail RUE Deficits / Details: No ROM to R shoulder due to surgery. Active ROM to elbow, wrist and hand only, Sling at all times RUE: Unable to fully assess due to immobilization;Shoulder pain at rest RUE Coordination: decreased gross motor   Lower Extremity Assessment Lower Extremity Assessment: Defer to PT evaluation   Cervical / Trunk Assessment Cervical / Trunk Assessment: Normal    Vision Baseline Vision/History: 1 Wears glasses Ability to See in Adequate Light: 0 Adequate Patient Visual Report: No change from baseline Vision Assessment?: No apparent visual deficits          Cognition Arousal/Alertness: Awake/alert Behavior During Therapy: WFL for tasks assessed/performed Overall Cognitive Status: Within Functional Limits for tasks assessed         Exercises General Exercises - Upper Extremity Elbow Flexion: AROM, Right, 5 reps, Seated (vc's and tc's required) Elbow Extension: AROM, Seated, 5 reps, Right (vc's and tc's required) Wrist Flexion: AROM, Right, 5 reps, Seated Wrist Extension: AROM, Right, Seated, 5 reps Digit Composite Flexion: AROM, 10 reps, Right, Seated Composite Extension: AROM, Right, 10 reps, Seated Other Exercises Other Exercises: general A/PROM to R elbow, wrist and hand to control swelling in operated arm. Ice for edema control. Opposition exercises to right hand - pt able to oppose to all digits noted    Shoulder Instructions Shoulder Instructions Method for sponge bathing under operated UE: Minimal assistance (No caregiver present) Donning/doffing sling/immobilizer: Minimal assistance (No caregiver present) Correct positioning of sling/immobilizer: Minimal assistance (verbal and tactile cues required as well as Min A) ROM for elbow, wrist and digits of operated UE: Supervision/safety;Min-guard Sling wearing schedule (on at all times/off for ADL's): Minimal assistance Positioning of UE while sleeping: Minimal assistance     General Comments Pt instructed/reviewed all ADL techniques (handouts reviewed as well), how to don/doff sling and proper positioning of R UE for ADL's, sleeping etc, no Active ROM to shoulder, sling at all times, Active ROM to right elbow, wrist and hand only. Pt states that she understands    Pertinent Vitals/ Pain       Pain Assessment Pain Assessment: Faces Faces Pain Scale: Hurts even more Pain Location: R shoulder Pain Descriptors / Indicators: Aching, Grimacing, Sharp Pain Intervention(s): Limited activity within patient's tolerance, Monitored during session, Repositioned, Ice applied  Home Living  Lives with husband, whom is able to assist. Husband does not drive per pt report. Please refer to initial OT eval for home details    Prior Functioning/Environment   Please  refer to initial OT eval for PLOF details, pt reports Mod I ADL's   Frequency  Min 2X/week        Progress Toward Goals  OT Goals(current goals can now be found in the care plan section)  Progress towards OT goals: Progressing toward goals  Acute Rehab OT Goals Patient Stated Goal: Go home OT Goal Formulation: With patient Time For Goal Achievement: 07/27/22 Potential to Achieve Goals: Good  Plan Discharge plan remains appropriate       AM-PAC OT "6 Clicks" Daily Activity     Outcome Measure   Help from another person eating meals?: A Little Help from another person taking care of personal grooming?: A Little Help from another person toileting, which includes using toliet, bedpan, or urinal?: A Little Help from another person bathing (including washing, rinsing, drying)?: A Little Help from another person to put on and taking off regular upper body clothing?: A Lot Help from another person to put on and taking off regular lower body clothing?: A Little 6 Click Score: 17    End of Session Equipment Utilized During Treatment: Other (comment) (Sling R UE, 1 person hand held assist)  OT Visit Diagnosis: Unsteadiness on feet (R26.81);Pain Pain - Right/Left: Right Pain - part of body: Shoulder   Activity Tolerance Patient limited by pain   Patient Left in chair;with call bell/phone within reach   Nurse Communication Mobility status;Other (comment) (Pt encouraged to call to use bathroom, Pure Wick removed. +1 Min hand held assist)        [id number] OT Time Calculation (min): 39 min  Charges: OT General Charges $OT Visit: 1 Visit OT Treatments $Self Care/Home Management : 38-52 mins   Kiya Eno Beth Dixon, OTR/L 07/14/2022, 9:10 AM

## 2022-07-14 NOTE — Discharge Summary (Signed)
Physician Discharge Summary  Lacey [name] [id number] DOB: 05/17/1950 DOA: 07/11/2022  PCP: Midge Minium, MD  Admit date: 07/11/2022 Discharge date: 07/14/2022  Admitted From: Home Discharge disposition: Home with home health PT/OT  Recommendations at discharge:  Judicious use of pain medicines Follow-up with orthopedics as an outpatient. For DVT prophylaxis, you have been switched from aspirin 81 mg daily to aspirin 325 milligram daily for 4 weeks.  After that, resume aspirin 81 mg daily as before.   Brief narrative: Lacey [name] is a 72 y.o. female with PMH significant for DM2, HTN, nonobstructive CAD, atrial tachycardia, chronic anemia, restless leg/tremor 3/9, patient was brought to the ED after a mechanical fall resulting in right shoulder dislocation.   Patient states she was walking her dog in the rain when she slipped on the ramp and fell onto her right side. Immediately after the fall she had severe right shoulder pain and rolled over onto her back.  She was on the floor for some time as her husband could not hear her.  Eventually got up and presented to the ED due to severe pain.  In the ED patient was hemodynamically stable WBC slightly elevated to 13.4. X-ray right shoulder showed comminuted fracture of the proximal humerus with inferomedial dislocation of the humeral head, which rests against the subscapularis muscle. The humeral diaphysis is superiorly subluxed into the glenoid fossa.   Seen by orthopedics. Patient underwent closed reduction in the ED and plan for surgery in a.m. Admitted to [facility] 3/10, underwent right shoulder reverse shoulder arthroplasty  Subjective: Patient was seen and examined this morning. Propped up in bed.  Not in distress.  Shoulder pain improving.  Daughter at bedside.  Worked with PT and OT.  Feels ready to go home today.  Hospital course: Right displaced proximal humeral fracture with significant displacement S/p right shoulder reverse  arthroplasty -Dr. Sammuel Hines 3/10 Secondary to mechanical fall.   Imaging as above.  Orthopedics consulted.  Surgery done Patient is currently on pain with scheduled Tylenol 3 times daily, and as needed oxycodone.  Continue the same post discharge.  PT/OT eval obtained.  Home health PT OT recommended. Previously, patient was on aspirin 81 mg daily.  Currently on hold while on DVT prophylaxis with aspirin 325 mg daily.  After 4 weeks, resume aspirin 81 mg daily.  Type 2 diabetes mellitus Hyperglycemia A1c 6.7 on 05/05/2022 PTA on Lantus 30 units nightly, Premeal NovoLog 25 units 3 times daily.  While in the hospital, she was given low-dose of insulin to avoid hypoglycemic episodes and hence the sugar level is mostly running elevated..  It seems her previous home regimen was keeping her A1c well-controlled.  Continue the same post discharge.  Chronic diastolic CHF Essential hypertension Continue Lasix 20 mg daily, valsartan 320 mg daily, verapamil 120 mg daily Continue verapamil. Lasix and ARB were held perioperatively.  Resume post discharge..  Nonobstructive CAD HLD Continue beta-blocker, fenofibrate daily.  Previously on aspirin 81 mg daily.  Aspirin plan as above.  Hypothyroidism Synthroid  Morbid Obesity  Body mass index is 42.06 kg/m. Patient has been advised to make an attempt to improve diet and exercise patterns to aid in weight loss.  OSA  Chronic smoker Counseled to quit.  Restless legs Continue Neurontin 90 mg 3 times daily, primidone  Anxiety/depression Continue Klonopin 1 mg daily as needed  Impaired mobility PT/OT obtained.  Home with PT/OT recommended.  Goals of care   Code Status: Full Code   Wounds:  -  Incision (Closed) 07/12/22 Arm Right (Active)  Date First Assessed/Time First Assessed: 07/12/22 1110   Location: Arm  Location Orientation: Right    Assessments 07/12/2022 11:15 AM 07/14/2022  7:15 AM  Dressing Type Hydrocolloid Hydrocolloid  Dressing Clean,  Dry, Intact Clean, Dry, Intact  Site / Wound Assessment Dressing in place / Unable to assess Dressing in place / Unable to assess  Drainage Amount Scant Scant  Drainage Description -- Serosanguineous     No associated orders.    Discharge Exam:   Vitals:   07/13/22 1627 07/13/22 2001 07/14/22 0530 07/14/22 0802  BP: (!) 155/73 (!) 178/50 (!) 154/43 (!) 161/45  Pulse: 68 69 73 66  Resp: '16 18 17 17  '$ Temp: 98.9 F (37.2 C) 98.6 F (37 C) 98.7 F (37.1 C) 98.7 F (37.1 C)  TempSrc: Oral Oral Oral Oral  SpO2: 93% 95% 92% 92%  Weight:      Height:        Body mass index is 42.06 kg/m.   General exam: Pleasant elderly Caucasian female.  Not in distress. Skin: No rashes, lesions or ulcers. HEENT: Atraumatic, normocephalic, no obvious bleeding Lungs: Clear to auscultation bilaterally CVS: Regular rate and rhythm, no murmur GI/Abd soft, nontender, nondistended, bowels are present CNS: Alert, awake, oriented x 3 Psychiatry: Mood appropriate Extremities: No pedal edema, no calf tenderness.  Right shoulder  Follow ups:    Follow-up Information     Vanetta Mulders, MD Follow up.   Specialty: Orthopedic Surgery Contact information: [place] [address] [phone]         [facility] Follow up.   Specialty: Home Health Services Contact information: [place] [address] [phone]         Midge Minium, MD Follow up.   Specialty: Family Medicine Contact information: [address] [place] [address] [phone]                 Discharge Instructions:   Discharge Instructions     Call MD for:  difficulty breathing, headache or visual disturbances   Complete by: As directed    Call MD for:  extreme fatigue   Complete by: As directed    Call MD for:  hives   Complete by: As directed    Call MD for:  persistant dizziness or light-headedness   Complete by: As  directed    Call MD for:  persistant nausea and vomiting   Complete by: As directed    Call MD for:  severe uncontrolled pain   Complete by: As directed    Call MD for:  temperature >100.4   Complete by: As directed    Diet - low sodium heart healthy   Complete by: As directed    Diet Carb Modified   Complete by: As directed    Discharge instructions   Complete by: As directed    Recommendations at discharge:   Judicious use of pain medicines  Follow-up with orthopedics as an outpatient.  For DVT prophylaxis, you have been switched from aspirin 81 mg daily to aspirin 325 milligram daily for 4 weeks.  After that, resume aspirin 81 mg daily as before.  Discharge instructions for diabetes mellitus: Check blood sugar 3 times a day and bedtime at home. If blood sugar running above 200 or less than 70 please call your MD to adjust insulin. If you notice signs and symptoms of hypoglycemia (low  blood sugar) like jitteriness, confusion, thirst, tremor and sweating, please check blood sugar, drink sugary drink/biscuits/sweets to increase sugar level and call MD or return to ER.    General discharge instructions: Follow with Primary MD Midge Minium, MD in 7 days  Please request your PCP  to go over your hospital tests, procedures, radiology results at the follow up. Please get your medicines reviewed and adjusted.  Your PCP may decide to repeat certain labs or tests as needed. Do not drive, operate heavy machinery, perform activities at heights, swimming or participation in water activities or provide baby sitting services if your were admitted for syncope or siezures until you have seen by Primary MD or a Neurologist and advised to do so again. [place] Controlled Substance Reporting System database was reviewed. Do not drive, operate heavy machinery, perform activities at heights, swim, participate in water activities or provide baby-sitting services while on medications for  pain, sleep and mood until your outpatient physician has reevaluated you and advised to do so again.  You are strongly recommended to comply with the dose, frequency and duration of prescribed medications. Activity: As tolerated with Full fall precautions use walker/cane & assistance as needed Avoid using any recreational substances like cigarette, tobacco, alcohol, or non-prescribed drug. If you experience worsening of your admission symptoms, develop shortness of breath, life threatening emergency, suicidal or homicidal thoughts you must seek medical attention immediately by calling 911 or calling your MD immediately  if symptoms less severe. You must read complete instructions/literature along with all the possible adverse reactions/side effects for all the medicines you take and that have been prescribed to you. Take any new medicine only after you have completely understood and accepted all the possible adverse reactions/side effects.  Wear Seat belts while driving. You were cared for by a hospitalist during your hospital stay. If you have any questions about your discharge medications or the care you received while you were in the hospital after you are discharged, you can call the unit and ask to speak with the hospitalist or the covering physician. Once you are discharged, your primary care physician will handle any further medical issues. Please note that NO REFILLS for any discharge medications will be authorized once you are discharged, as it is imperative that you return to your primary care physician (or establish a relationship with a primary care physician if you do not have one).   Increase activity slowly   Complete by: As directed        Discharge Medications:   Allergies as of 07/14/2022   No Known Allergies      Medication List     TAKE these medications    acetaminophen 500 MG tablet Commonly known as: TYLENOL Take 2 tablets (1,000 mg total) by mouth 3 (three) times  daily.   albuterol 108 (90 Base) MCG/ACT inhaler Commonly known as: VENTOLIN HFA TAKE 2 PUFFS BY MOUTH EVERY 6 HOURS AS NEEDED FOR WHEEZE OR SHORTNESS OF BREATH What changed: See the new instructions.   aspirin 325 MG tablet Take 1 tablet (325 mg total) by mouth daily for 28 days. Start taking on: July 15, 2022 What changed: You were already taking a medication with the same name, and this prescription was added. Make sure you understand how and when to take each.   aspirin EC 81 MG tablet Take 1 tablet (81 mg total) by mouth daily. Start taking on: August 13, 2022 What changed: These instructions start on August 13, 2022. If you are unsure what to do until then, ask your doctor or other care provider.   atorvastatin 20 MG tablet Commonly known as: LIPITOR TAKE 1 TABLET BY MOUTH EVERY DAY   calcium-vitamin D 500-5 MG-MCG tablet Commonly known as: OSCAL WITH D Take 1 tablet by mouth.   cephALEXin 500 MG capsule Commonly known as: KEFLEX Take 500 mg by mouth daily at 12 noon.   cholecalciferol 25 MCG (1000 UT) tablet Generic drug: Cholecalciferol Take 1,000 Units by mouth daily.   Vitamin D-3 25 MCG (1000 UT) Caps Take 1,000 Units by mouth daily.   clonazePAM 1 MG tablet Commonly known as: KLONOPIN TAKE 1 TABLET AT BEDTIME FOR RESTLESS LEGS SYNDROME What changed:  how much to take how to take this when to take this reasons to take this additional instructions   Dexcom G6 Receiver [facility] Use as directed w/ G6 sensor   Dexcom G6 Sensor Misc Apply sensor every 10 days for continuous blood sugar readings   Dexcom G6 Transmitter Misc Use as directed w/ G6 sensor   diclofenac Sodium 1 % Gel Commonly known as: VOLTAREN APPLY 2 GRAMS TO AFFECTED AREA 4 TIMES A DAY What changed: See the new instructions.   Droplet Pen Needles 31G X 8 MM Misc Generic drug: Insulin Pen Needle USE 4 TIMES DAILY   DropSafe Alcohol Prep 70 % Pads USE AS DIRECTED  AS NEEDED.    fenofibrate 160 MG tablet TAKE 1 TABLET EVERY DAY   furosemide 20 MG tablet Commonly known as: LASIX TAKE 1 TABLET EVERY DAY   gabapentin 300 MG capsule Commonly known as: NEURONTIN Take 3 capsules (900 mg total) by mouth 3 (three) times daily.   Gemtesa 75 MG Tabs Generic drug: Vibegron Take 1 tablet by mouth daily.   Lancets Super Thin 28G Misc Please use as directed to test sugars 4 times daily. Dx. E11.9   lansoprazole 15 MG capsule Commonly known as: PREVACID Take 15 mg by mouth daily before breakfast.   Lantus SoloStar 100 UNIT/ML Solostar Pen Generic drug: insulin glargine INJECT 60 UNITS UNDER THE SKIN AT BEDTIME What changed:  how much to take how to take this when to take this additional instructions   levothyroxine 50 MCG tablet Commonly known as: SYNTHROID TAKE 1 TABLET BY MOUTH EVERY DAY BEFORE BREAKFAST What changed: See the new instructions.   loperamide 2 MG tablet Commonly known as: IMODIUM A-D Take 2 mg by mouth 4 (four) times daily as needed for diarrhea or loose stools.   NovoLOG FlexPen 100 UNIT/ML FlexPen Generic drug: insulin aspart INJECT 25 UNITS SUBCUTANEOUSLY THREE TIMES DAILY WITH MEALS What changed:  how much to take how to take this when to take this additional instructions   oxyCODONE 5 MG immediate release tablet Commonly known as: Oxy IR/ROXICODONE Take 1 tablet (5 mg total) by mouth every 6 (six) hours as needed for up to 7 days for moderate pain.   primidone 50 MG tablet Commonly known as: MYSOLINE TAKE  2   TABLETS IN THE AM  AND TAKE 1 IN THE PM  BY MOUTH TWICE A DAY What changed:  how much to take how to take this when to take this additional instructions   traZODone 100 MG tablet Commonly known as: DESYREL TAKE 1 TABLET AT BEDTIME What changed: how much to take   True Metrix Blood Glucose Test test strip Generic drug: glucose blood Use as instructed to test blood sugar 4 times daily. Dx. E11.9  True  Metrix Meter w/Device Kit USE AS DIRECTED   valsartan 320 MG tablet Commonly known as: DIOVAN TAKE 1 TABLET EVERY DAY What changed: when to take this   verapamil 120 MG CR tablet Commonly known as: CALAN-SR TAKE 1 TABLET BY MOUTH EVERYDAY AT BEDTIME What changed: See the new instructions.         The results of significant diagnostics from this hospitalization (including imaging, microbiology, ancillary and laboratory) are listed below for reference.    Procedures and Diagnostic Studies:   DG Shoulder Right  Result Date: 07/12/2022 CLINICAL DATA:  Right shoulder surgery EXAM: RIGHT SHOULDER - 2+ VIEW COMPARISON:  07/10/2020 FINDINGS: Interval postsurgical changes from reverse right shoulder arthroplasty. Arthroplasty components are in their expected alignment. Proximal humeral fractures are again noted at the surgical neck including a mildly displaced fragment present medially. These fractures were present on the preoperative exam. No new fractures are identified. Expected postoperative changes within the overlying soft tissues. IMPRESSION: Interval postsurgical changes from reverse right shoulder arthroplasty. Electronically Signed   By: Davina Poke D.O.   On: 07/12/2022 11:40   DG Chest Portable 1 View  Result Date: 07/11/2022 CLINICAL DATA:  Preoperative evaluation for shoulder surgery EXAM: PORTABLE CHEST 1 VIEW COMPARISON:  Chest radiograph dated 01/15/2014 FINDINGS: Normal lung volumes. No focal consolidations. No pleural effusion or pneumothorax. The heart size and mediastinal contours are within normal limits. Comminuted right proximal humeral fracture, better evaluated on prior CT. Cervical spinal fixation hardware appears intact. Clips in the gastroesophageal region. IMPRESSION: 1. No active pulmonary disease. 2. Comminuted right proximal humeral fracture, better evaluated on prior CT. Electronically Signed   By: Darrin Nipper M.D.   On: 07/11/2022 12:43   CT Shoulder Right Wo  Contrast  Result Date: 07/11/2022 CLINICAL DATA:  Anterior dislocation of the shoulder EXAM: CT OF THE UPPER RIGHT EXTREMITY WITHOUT CONTRAST TECHNIQUE: Multidetector CT imaging of the upper right extremity was performed according to the standard protocol. RADIATION DOSE REDUCTION: This exam was performed according to the departmental dose-optimization program which includes automated exposure control, adjustment of the mA and/or kV according to patient size and/or use of iterative reconstruction technique. COMPARISON:  Shoulder radiograph dated 07/11/2022 FINDINGS: Bones/Joint/Cartilage Comminuted fracture of the proximal humerus with inferomedial dislocation of the humeral head, which rests against the subscapularis muscle. The humeral diaphysis is superiorly subluxed into the glenoid fossa. The glenoid appears intact. The acromioclavicular joint is preserved with degenerative changes. Ligaments Suboptimally assessed by CT. Muscles and Tendons Grossly intact. Small volume hematoma within the rotator cuff muscles. Soft tissues Soft tissue stranding in the axillary region. IMPRESSION: Comminuted fracture of the proximal humerus with inferomedial dislocation of the humeral head, which rests against the subscapularis muscle. The humeral diaphysis is superiorly subluxed into the glenoid fossa. Electronically Signed   By: Darrin Nipper M.D.   On: 07/11/2022 11:59   DG Humerus Right  Result Date: 07/11/2022 CLINICAL DATA:  Fall with RIGHT arm pain. EXAM: RIGHT HUMERUS - 2+ VIEW COMPARISON:  None Available. FINDINGS: Anterior-inferior dislocation of the humeral head is noted. Fracture of the humeral neck/head is noted with greater tuberosity fracture fragment displaced laterally by approximately 2.5 cm. There is minimal irregularity of the inferior glenoid and a glenoid fracture is not excluded. The remainder of the humerus is unremarkable. IMPRESSION: 1. Anterior-inferior dislocation of the humeral head with humeral  neck/head fracture and displaced greater tuberosity fracture fragment. 2. Equivocal glenoid fracture. Electronically Signed   By: Margarette Canada  M.D.   On: 07/11/2022 08:33   DG Shoulder Right  Result Date: 07/11/2022 CLINICAL DATA:  Acute RIGHT shoulder pain following fall. Initial encounter. EXAM: RIGHT SHOULDER - 3 VIEW COMPARISON:  None Available. FINDINGS: Anterior-inferior dislocation of the humeral head is noted. Fracture of the humeral neck/head is noted with greater tuberosity fracture fragment displaced laterally by approximately 2.5 cm. There is minimal irregularity of the inferior glenoid and a glenoid fracture is not excluded. IMPRESSION: 1. Anterior-inferior dislocation of the humeral head with humeral neck/head fracture and displaced greater tuberosity fracture fragment. 2. Equivocal inferior glenoid fracture. Electronically Signed   By: Margarette Canada M.D.   On: 07/11/2022 08:31     Labs:   Basic Metabolic Panel: Recent Labs  Lab 07/11/22 0841 07/12/22 0343  NA 139 136  K 4.2 3.9  CL 106 103  CO2 24 24  GLUCOSE 154* 155*  BUN 10 12  CREATININE 0.83 0.96  CALCIUM 8.6* 8.5*  MG  --  1.8   GFR Estimated Creatinine Clearance: 60 mL/min (by C-G formula based on SCr of 0.96 mg/dL). Liver Function Tests: No results for input(s): "AST", "ALT", "ALKPHOS", "BILITOT", "PROT", "ALBUMIN" in the last 168 hours. No results for input(s): "LIPASE", "AMYLASE" in the last 168 hours. No results for input(s): "AMMONIA" in the last 168 hours. Coagulation profile No results for input(s): "INR", "PROTIME" in the last 168 hours.  CBC: Recent Labs  Lab 07/11/22 0841 07/12/22 0343  WBC 13.4* 8.0  NEUTROABS 11.6*  --   HGB 13.4 11.4*  HCT 41.0 34.8*  MCV 97.4 95.6  PLT 170 153   Cardiac Enzymes: No results for input(s): "CKTOTAL", "CKMB", "CKMBINDEX", "TROPONINI" in the last 168 hours. BNP: Invalid input(s): "POCBNP" CBG: Recent Labs  Lab 07/13/22 0736 07/13/22 1222 07/13/22 1733  07/13/22 1959 07/14/22 0802  GLUCAP 201* 254* 216* 363* 264*   D-Dimer No results for input(s): "DDIMER" in the last 72 hours. Hgb A1c No results for input(s): "HGBA1C" in the last 72 hours. Lipid Profile No results for input(s): "CHOL", "HDL", "LDLCALC", "TRIG", "CHOLHDL", "LDLDIRECT" in the last 72 hours. Thyroid function studies No results for input(s): "TSH", "T4TOTAL", "T3FREE", "THYROIDAB" in the last 72 hours.  Invalid input(s): "FREET3" Anemia work up No results for input(s): "VITAMINB12", "FOLATE", "FERRITIN", "TIBC", "IRON", "RETICCTPCT" in the last 72 hours. Microbiology Recent Results (from the past 240 hour(s))  Surgical PCR screen     Status: None   Collection Time: 07/12/22  5:14 AM   Specimen: Nasal Mucosa; Nasal Swab  Result Value Ref Range Status   MRSA, PCR NEGATIVE NEGATIVE Final   Staphylococcus aureus NEGATIVE NEGATIVE Final    Comment: (NOTE) The Xpert SA Assay (FDA approved for NASAL specimens in patients 34 years of age and older), is one component of a comprehensive surveillance program. It is not intended to diagnose infection nor to guide or monitor treatment. Performed at [facility], [place] [address], [place], [place] [address]     Time coordinating discharge: 45 minutes  Signed: Kian Ottaviano  [facility] 07/14/2022, 10:49 AM

## 2022-07-15 ENCOUNTER — Telehealth: Payer: Self-pay | Admitting: Orthopaedic Surgery

## 2022-07-15 ENCOUNTER — Telehealth: Payer: Self-pay

## 2022-07-15 NOTE — Telephone Encounter (Signed)
Patient had surgery on Sunday am and called to get 1 week po for Dr. Sammuel Hines, no openings. Made appointment for April 10th. Please call for rescheduling for 1 week P/O

## 2022-07-15 NOTE — Transitions of Care (Post Inpatient/ED Visit) (Signed)
   07/15/2022  Name: Lacey [name] MRN: [id number] DOB: 1950-12-18  Today's TOC FU Call Status: Today's TOC FU Call Status:: Successful TOC FU Call Competed TOC FU Call Complete Date: 07/15/22  Transition Care Management Follow-up Telephone Call Date of Discharge: 07/14/22 Discharge Facility: [facility]) Type of Discharge: Inpatient Admission Primary Inpatient Discharge Diagnosis:: Right displaced proximal humeral fracture with significant displacement How have you been since you were released from the hospital?: Better Any questions or concerns?: No  Items Reviewed: Did you receive and understand the discharge instructions provided?: Yes Medications obtained and verified?: Yes (Medications Reviewed) Any new allergies since your discharge?: No Dietary orders reviewed?: Yes Do you have support at home?: Yes  Home Care and Equipment/Supplies: [place] Ordered?: Yes Name of [place]:: [facility] Has Agency set up a time to come to your home?: No Any new equipment or medical supplies ordered?: No  Functional Questionnaire: Do you need assistance with bathing/showering or dressing?: Yes Do you need assistance with meal preparation?: Yes Do you need assistance with eating?: No Do you have difficulty maintaining continence: No Do you need assistance with getting out of bed/getting out of a chair/moving?: Yes Do you have difficulty managing or taking your medications?: No  Folllow up appointments reviewed: PCP Follow-up appointment confirmed?: Yes Date of PCP follow-up appointment?: 07/22/22 Follow-up Provider: Dr Birdie Riddle [facility] Follow-up appointment confirmed?: No Reason Specialist Follow-Up Not Confirmed: Patient has Specialist Provider Number and will Call for Appointment Do you need transportation to your follow-up appointment?: No Do you understand care options if your condition(s) worsen?: Yes-patient verbalized  understanding    [place] LPN [place] Direct Dial [phone]

## 2022-07-16 ENCOUNTER — Telehealth: Payer: Self-pay | Admitting: Family Medicine

## 2022-07-16 DIAGNOSIS — G4733 Obstructive sleep apnea (adult) (pediatric): Secondary | ICD-10-CM | POA: Diagnosis not present

## 2022-07-16 DIAGNOSIS — Z794 Long term (current) use of insulin: Secondary | ICD-10-CM | POA: Diagnosis not present

## 2022-07-16 DIAGNOSIS — Z9181 History of falling: Secondary | ICD-10-CM | POA: Diagnosis not present

## 2022-07-16 DIAGNOSIS — F32A Depression, unspecified: Secondary | ICD-10-CM | POA: Diagnosis not present

## 2022-07-16 DIAGNOSIS — Z6841 Body Mass Index (BMI) 40.0 and over, adult: Secondary | ICD-10-CM | POA: Diagnosis not present

## 2022-07-16 DIAGNOSIS — E1169 Type 2 diabetes mellitus with other specified complication: Secondary | ICD-10-CM | POA: Diagnosis not present

## 2022-07-16 DIAGNOSIS — Z792 Long term (current) use of antibiotics: Secondary | ICD-10-CM | POA: Diagnosis not present

## 2022-07-16 DIAGNOSIS — E039 Hypothyroidism, unspecified: Secondary | ICD-10-CM | POA: Diagnosis not present

## 2022-07-16 DIAGNOSIS — S42251D Displaced fracture of greater tuberosity of right humerus, subsequent encounter for fracture with routine healing: Secondary | ICD-10-CM | POA: Diagnosis not present

## 2022-07-16 DIAGNOSIS — F17200 Nicotine dependence, unspecified, uncomplicated: Secondary | ICD-10-CM | POA: Diagnosis not present

## 2022-07-16 DIAGNOSIS — I110 Hypertensive heart disease with heart failure: Secondary | ICD-10-CM | POA: Diagnosis not present

## 2022-07-16 DIAGNOSIS — Z7982 Long term (current) use of aspirin: Secondary | ICD-10-CM | POA: Diagnosis not present

## 2022-07-16 DIAGNOSIS — I2510 Atherosclerotic heart disease of native coronary artery without angina pectoris: Secondary | ICD-10-CM | POA: Diagnosis not present

## 2022-07-16 DIAGNOSIS — I4719 Other supraventricular tachycardia: Secondary | ICD-10-CM | POA: Diagnosis not present

## 2022-07-16 DIAGNOSIS — I5032 Chronic diastolic (congestive) heart failure: Secondary | ICD-10-CM | POA: Diagnosis not present

## 2022-07-16 DIAGNOSIS — D649 Anemia, unspecified: Secondary | ICD-10-CM | POA: Diagnosis not present

## 2022-07-16 DIAGNOSIS — Z96611 Presence of right artificial shoulder joint: Secondary | ICD-10-CM | POA: Diagnosis not present

## 2022-07-16 DIAGNOSIS — S42291D Other displaced fracture of upper end of right humerus, subsequent encounter for fracture with routine healing: Secondary | ICD-10-CM | POA: Diagnosis not present

## 2022-07-16 DIAGNOSIS — F419 Anxiety disorder, unspecified: Secondary | ICD-10-CM | POA: Diagnosis not present

## 2022-07-16 DIAGNOSIS — G2581 Restless legs syndrome: Secondary | ICD-10-CM | POA: Diagnosis not present

## 2022-07-16 DIAGNOSIS — E785 Hyperlipidemia, unspecified: Secondary | ICD-10-CM | POA: Diagnosis not present

## 2022-07-16 LAB — BPAM RBC
Blood Product Expiration Date: 202404042359
Blood Product Expiration Date: 202404052359
ISSUE DATE / TIME: 202403080216
Unit Type and Rh: 5100
Unit Type and Rh: 5100

## 2022-07-16 LAB — TYPE AND SCREEN
ABO/RH(D): O POS
Antibody Screen: NEGATIVE
Unit division: 0
Unit division: 0

## 2022-07-16 NOTE — Telephone Encounter (Signed)
Home Health   Agency:  [facility]  Caller: (Contact and title)Jim  Call back [phone]  Fax #:    Requesting PT:    Reason for Request:  Pt fell 07/14/22 hit her head small amount of bleeding, 220/80 was BP at start of day. After 45 mins of talking BP was 145/80.  BS fasting was 144, 5 hours later it was 200.  Post Shoulder surgery med change one was a blood thinner-advise   Recommendations go to the patient but therapist is welcome to talk.   HH needs F2F w/in last 30 days

## 2022-07-16 NOTE — Telephone Encounter (Signed)
Pt was discharged home on Tuesday 07/14/22 Fasting blood sugar was  144 after cereal was  200  The Therapist states she is on a blood thinner and his biggest concern is  that her Bp is high and she did have a small amount of bleeding on head with fall but no open wound .   Mr Heber Woodruff states that pt BP was  220/80 but after taking to the pt her BP came down to 145/80. He has placed a call to the surgeon as well . He is asking if you wanted to change the pt mediation ? She was walking up the ramp when she fell. She is on Aspirin , Oxycodone .   I spoke to the pt she states the only blood thinner she is taking is Aspirin 325 mg , Tylenol , Gabapentin ,   Lacey [name] ,PT

## 2022-07-16 NOTE — Telephone Encounter (Signed)
Mr Lacey [name] pt's PT is asking if pt has a dx of CHF and if she has any heart failure dx would you like them to teach low sodium diet and daily weigh in "s

## 2022-07-17 ENCOUNTER — Telehealth: Payer: Self-pay | Admitting: Orthopaedic Surgery

## 2022-07-17 ENCOUNTER — Encounter (HOSPITAL_COMMUNITY): Payer: Self-pay | Admitting: Orthopaedic Surgery

## 2022-07-17 ENCOUNTER — Telehealth: Payer: Self-pay

## 2022-07-17 NOTE — Telephone Encounter (Signed)
LMOM for Lacey [name] stating OT for shoulder should not begin until 4-6 weeks post op and we will provide the OK to begin OT/PT of her post operative shoulder

## 2022-07-17 NOTE — Telephone Encounter (Signed)
She does have a dx of chronic diastolic heart failure.  Low sodium diet would be great.  I don't think she needs to worry about daily weights at this time

## 2022-07-17 NOTE — Telephone Encounter (Signed)
Left Mr Heber Emmons a [place] stating Dr Virgil Benedict message She does have a dx of chronic diastolic heart failure. Low sodium diet would be great. I don't think she needs to worry about daily weights at this time  Spoke to the pt and advised and she was very understanding

## 2022-07-17 NOTE — Telephone Encounter (Signed)
Lacey [name] (OT) from [facility] called requesting orders for shoulder. He states hospital notes states for hand, wrist arm but not shoulder. Please call Lacey [name] on his secure line about this matter at [phone]. If unable to answer leave detailed message.

## 2022-07-17 NOTE — Telephone Encounter (Signed)
Clair Gulling, PT with [facility] called stating that patient had a fall on Tuesday and hit her head, but no headache and BP was elevated when he first arrived for evaluation. (220/80).  Stated that he did leave a message for patient's PCP.   Cb# [phone].  Please advise.  Would like verbal orders for Fall Risk and Function Mobility for   1 week 1 2 week 4 1 week 1

## 2022-07-17 NOTE — Telephone Encounter (Signed)
[facility] called needing clarification about dressing on wound Change do they need to do it at home or does it need to be done in office and when does It need to be done? Marland Kitchen..also she has a sling and what is the frequency the Dr wants her to use it? Call Back Number [phone]

## 2022-07-20 ENCOUNTER — Telehealth: Payer: Self-pay | Admitting: Orthopaedic Surgery

## 2022-07-20 NOTE — Telephone Encounter (Signed)
LMOM (secure line) stating dressing did not need to be changed. Sling should only be removed for PT of elbow, wrist and hand, but pt should remain in sling at all other times including sleeping. Left my direct line for CB if any other questions

## 2022-07-20 NOTE — Telephone Encounter (Signed)
LMOM stating sling may be removed for bathing and dressing as long as no shoulder movement occurs.

## 2022-07-20 NOTE — Telephone Encounter (Signed)
[place] asking for verbal orders..1x4w , adl funct. Mob pain control--[phone]

## 2022-07-20 NOTE — Telephone Encounter (Signed)
Lacey [name] (OT) from [facility] called requesting a call back on his secure line. Lacey [name] is asking if pt can take sling off only for dressing and bathing as long as no shoulder movement. Also Lacey [name] is asking if pt can shower or continue sponge baths. Please call Don at [phone].

## 2022-07-21 ENCOUNTER — Telehealth: Payer: Self-pay | Admitting: Orthopaedic Surgery

## 2022-07-21 NOTE — Telephone Encounter (Signed)
Received call from Tommi Emery (PT) asking if patient is allowed to shower or only take sponge bath. The number to contact Tommi Emery is [phone]

## 2022-07-22 ENCOUNTER — Encounter: Payer: Self-pay | Admitting: Family Medicine

## 2022-07-22 ENCOUNTER — Other Ambulatory Visit: Payer: Self-pay

## 2022-07-22 ENCOUNTER — Telehealth: Payer: Self-pay | Admitting: Family Medicine

## 2022-07-22 ENCOUNTER — Ambulatory Visit (INDEPENDENT_AMBULATORY_CARE_PROVIDER_SITE_OTHER): Payer: Medicare HMO | Admitting: Family Medicine

## 2022-07-22 VITALS — BP 138/80 | HR 62 | Temp 97.8°F | Resp 17 | Ht 62.0 in | Wt 226.5 lb

## 2022-07-22 DIAGNOSIS — I10 Essential (primary) hypertension: Secondary | ICD-10-CM | POA: Diagnosis not present

## 2022-07-22 DIAGNOSIS — F419 Anxiety disorder, unspecified: Secondary | ICD-10-CM

## 2022-07-22 DIAGNOSIS — S42291A Other displaced fracture of upper end of right humerus, initial encounter for closed fracture: Secondary | ICD-10-CM

## 2022-07-22 DIAGNOSIS — G4700 Insomnia, unspecified: Secondary | ICD-10-CM | POA: Diagnosis not present

## 2022-07-22 DIAGNOSIS — Z79899 Other long term (current) drug therapy: Secondary | ICD-10-CM

## 2022-07-22 MED ORDER — FENOFIBRATE 160 MG PO TABS: 160.0000 mg | ORAL_TABLET | Freq: Every day | ORAL | 10 refills | Status: DC

## 2022-07-22 MED ORDER — OXYCODONE HCL 10 MG PO TABS: 10.0000 mg | ORAL_TABLET | Freq: Four times a day (QID) | ORAL | 0 refills | Status: DC | PRN

## 2022-07-22 MED ORDER — TRAZODONE HCL 100 MG PO TABS: 100.0000 mg | ORAL_TABLET | Freq: Every day | ORAL | 3 refills | Status: DC

## 2022-07-22 MED ORDER — SERTRALINE HCL 25 MG PO TABS: 25.0000 mg | ORAL_TABLET | Freq: Every day | ORAL | 3 refills | Status: DC

## 2022-07-22 MED ORDER — VALSARTAN 320 MG PO TABS: 320.0000 mg | ORAL_TABLET | Freq: Every day | ORAL | 2 refills | Status: DC

## 2022-07-22 NOTE — Telephone Encounter (Signed)
That date is too soon, pt should be seen in 3-4 weeks

## 2022-07-22 NOTE — Patient Instructions (Signed)
Follow up in 3-4 weeks to recheck mood START the Sertraline once daily.  Morning or night doesn't matter, whatever is easiest for you USE the Oxycodone 10mg  every 6 hrs as needed for pain CONTINUE the Tylenol ICE!!! Try and change positions more frequently to avoid other aches and pains/stiffness Call with any questions or concerns Hang in there! You Can Do This!!!  But you have to take care of yourself first!!!

## 2022-07-22 NOTE — Telephone Encounter (Signed)
Patient had appt 08/04/22 scheduled with her husband. Patient appt was cancelled by Wells Guiles at another office. Can patient come in with husband to be seen?

## 2022-07-22 NOTE — Telephone Encounter (Signed)
At this time you are fully booked this date, his appt is at 10:20 please advise

## 2022-07-22 NOTE — Telephone Encounter (Signed)
I have spoken to the pt . I have schedule her to see Dr Birdie Riddle on 08/19/22.

## 2022-07-22 NOTE — Telephone Encounter (Signed)
She doesn't need to be seen that soon.  3-4 weeks for a mood follow up is fine

## 2022-07-22 NOTE — Telephone Encounter (Signed)
I called Mrs.Monroy, no answer, LM for pattient to call back to our office to schedule her 4 week follow up with Dr.Tabori

## 2022-07-22 NOTE — Progress Notes (Signed)
   Subjective:    Patient ID: Lacey [name], female    DOB: May 18, 1950, 72 y.o.   MRN: [id number]  HPI Hospital f/u- pt was admitted 3/9-3/12 after falling while walking her dog.  Xrays showed comminuted fracture of R proximal humerus w/ inferomedial dislocation of humeral head.  The humeral diaphysis is superiorly subluxed into glenoid fossa.  On 3/10 had reverse shoulder arthroplasty w/ Dr Steward Drone.  Pt was dc'd w/ home health PT/OT.  Was told to take full strength ASA x4 weeks and then resume  daily.  Sent home on Oxycodone  q6 hrs.  Also taking  Tylenol.  No pain relief.  Pt is not sleeping.  Per step-daughter, pt's mental health 'is bad.  Real bad'.     Review of Systems For ROS see HPI     Objective:   Physical Exam Vitals reviewed.  Constitutional:      General: She is not in acute distress.    Appearance: She is not ill-appearing.     Comments: Obviously in pain  HENT:     Head: Normocephalic and atraumatic.  Cardiovascular:     Rate and Rhythm: Normal rate and regular rhythm.  Pulmonary:     Effort: Pulmonary effort is normal. No respiratory distress.  Musculoskeletal:     Comments: R arm immobilized in sling  Skin:    General: Skin is warm and dry.  Neurological:     Mental Status: She is alert and oriented to person, place, and time.  Psychiatric:     Comments: Tearful, anxious           Assessment & Plan:

## 2022-07-24 ENCOUNTER — Ambulatory Visit (INDEPENDENT_AMBULATORY_CARE_PROVIDER_SITE_OTHER): Payer: Medicare HMO | Admitting: Orthopaedic Surgery

## 2022-07-24 ENCOUNTER — Other Ambulatory Visit (HOSPITAL_BASED_OUTPATIENT_CLINIC_OR_DEPARTMENT_OTHER): Payer: Self-pay | Admitting: Orthopaedic Surgery

## 2022-07-24 ENCOUNTER — Ambulatory Visit (INDEPENDENT_AMBULATORY_CARE_PROVIDER_SITE_OTHER): Payer: Medicare HMO

## 2022-07-24 DIAGNOSIS — Z96611 Presence of right artificial shoulder joint: Secondary | ICD-10-CM

## 2022-07-24 DIAGNOSIS — Z471 Aftercare following joint replacement surgery: Secondary | ICD-10-CM | POA: Diagnosis not present

## 2022-07-24 NOTE — Progress Notes (Signed)
Post Operative Evaluation    Procedure/Date of Surgery: Right shoulder reverse shoulder arthroplasty July 12, 2022  Interval History:    Presents today for 2-week follow-up status post the above procedure.  Overall she has been doing well.  She has been having home therapy come to treat her at her home.  She is overall doing much better with minimal pain.  Denies any numbness or paresthesias in the right hand.   PMH/PSH/Family History/Social History/Meds/Allergies:    Past Medical History:  Diagnosis Date   Adenomatous colon polyp    hyperplastic   Anemia    Anginal pain ([place])    not recent   Arthritis    NECK, KNEES, FINGERS, TOES   Atrial tachycardia CARDIOLOGIST - DR Caryl Comes (LAST VISIT AUG 2012)   Echo 12/11: EF 55-60%, Mild LVH, grade 1 diast dysfxn;   holter 1/12: ATach   Atypical chest pain    a. 07/2004 Cath: Clean cors;  b. 04/2010 Myoview: EF 63%, no ischemia   Blood transfusion without reported diagnosis 1969   CHF (congestive heart failure) (HCC)    Chronic kidney disease    left kidney small    Coronary artery disease    Diabetes mellitus, type 2 (HCC)    ORAL AND INSULIN MEDS (LAST A1C  7.3)   Diverticulosis    Erythema CURRENT-- CLOSED ABD. WALL ABSCESS   PER PCP NOTE (03-31-11)- MRSA-- TAKES DOXYCYCLINE   GERD (gastroesophageal reflux disease)    CONTROLLED W/ PREVACID   History of kidney stones 2012   Hyperlipidemia    Hypertension    Insomnia    TAKES MEDS PRN   Neuropathy, peripheral    Obesity (BMI 30-39.9) 02/19/2015   Pneumonia    as child   Post op infection 11/07/2012   left bunionectomy   Restless leg syndrome    Sepsis, urinary HISTORY - 2004   Sleep apnea    no cpap    Thyroid disease    TIA (transient ischemic attack)    "mini strokes" 2023   Tremor    Vitamin D deficiency    Past Surgical History:  Procedure Laterality Date   ABDOMINAL HYSTERECTOMY  1995   ovaries remain   BUNIONECTOMY Left  10/24/2012   BUNIONECTOMY Right 05/17/2017   CARDIAC CATHETERIZATION  07/10/2004   CARPAL TUNNEL RELEASE  2000   RIGHT   CATARACT EXTRACTION, BILATERAL     CERVICAL FUSION  10/12/2007   C5 - 7   COLONOSCOPY     CYSTOSCOPY WITH RETROGRADE PYELOGRAM, URETEROSCOPY AND STENT PLACEMENT Left 11/28/2012   Procedure: LEFT RETROGRADE PYELOGRAM, LEFT URETEROSCOPY, ;  Surgeon: Claybon Jabs, MD;  Location: [facility];  Service: Urology;  Laterality: Left;   CYSTOSCOPY/RETROGRADE/URETEROSCOPY/STONE EXTRACTION WITH BASKET  X2 2004 & 2009   LEFT    GASTRIC BYPASS  1981   KNEE ARTHROSCOPY  12/2010   LEFT   LAPAROSCOPIC CHOLECYSTECTOMY  2001   LEFT HEART CATH AND CORONARY ANGIOGRAPHY N/A 12/03/2021   Procedure: LEFT HEART CATH AND CORONARY ANGIOGRAPHY;  Surgeon: Jettie Booze, MD;  Location: [place] [facility];  Service: Cardiovascular;  Laterality: N/A;   left thumb joint surgery  2013   PERCUTANEOUS NEPHROSTOLITHOTOMY  02/27/2011   LEFT   POLYPECTOMY     REVERSE SHOULDER ARTHROPLASTY Right 07/12/2022  Procedure: REVERSE SHOULDER ARTHROPLASTY;  Surgeon: Vanetta Mulders, MD;  Location: [place];  Service: Orthopedics;  Laterality: Right;   RIGHT THUMB JOINT SURG.  09/2009   TRIGGER FINGER RELEASE  2010   RIGHT THUMB   UPPER GASTROINTESTINAL ENDOSCOPY     URETERAL STENT PLACEMENT  X2  2004  &  2009   LEFT   URETEROSCOPY  04/06/2011   Procedure: URETEROSCOPY;  Surgeon: Claybon Jabs, MD;  Location: [facility];  Service: Urology;  Laterality: Left;  L Ureteroscopy Laser Litho & Stent    Social History   Socioeconomic History   Marital status: Married    Spouse name: Not on file   Number of children: 2   Years of education: 12   Highest education level: High school graduate  Occupational History   Occupation: Engineer, materials middle school    Employer: [employer]    Comment: in office   Occupation: Retired  Tobacco Use   Smoking status: Every Day    Packs/day: 2.00     Years: 43.00    Additional pack years: 0.00    Total pack years: 86.00    Types: Cigarettes   Smokeless tobacco: Never   Tobacco comments:    1/2 ppd 12/29/21  Vaping Use   Vaping Use: Never used  Substance and Sexual Activity   Alcohol use: No    Alcohol/week: 0.0 standard drinks of alcohol   Drug use: No   Sexual activity: Not on file  Other Topics Concern   Not on file  Social History Narrative   2 children, 2 stepchildren   Lives with husband.   Right-handed.   No daily caffeine.   Social Determinants of Health   Financial Resource Strain: Low Risk  (01/08/2022)   Overall Financial Resource Strain (CARDIA)    Difficulty of Paying Living Expenses: Not hard at all  Food Insecurity: No Food Insecurity (01/08/2022)   Hunger Vital Sign    Worried About Running Out of Food in the Last Year: Never true    Ran Out of Food in the Last Year: Never true  Transportation Needs: No Transportation Needs (01/08/2022)   PRAPARE - Hydrologist (Medical): No    Lack of Transportation (Non-Medical): No  Physical Activity: Insufficiently Active (01/08/2022)   Exercise Vital Sign    Days of Exercise per Week: 3 days    Minutes of Exercise per Session: 30 min  Stress: No Stress Concern Present (01/08/2022)   [place]    Feeling of Stress : Not at all  Social Connections: Moderately Integrated (01/08/2022)   Social Connection and Isolation Panel [NHANES]    Frequency of Communication with Friends and Family: Three times a week    Frequency of Social Gatherings with Friends and Family: Once a week    Attends Religious Services: More than 4 times per year    Active Member of [employer] or Organizations: No    Attends Music therapist: Never    Marital Status: Married   Family History  Problem Relation Age of Onset   Hyperlipidemia Mother    Hypertension Mother    Heart attack Father    Lung  disease Father    Heart disease Sister    Hypertension Sister    Colon polyps Sister    Hypertension Sister    Colon polyps Sister    Hypertension Sister    Hypertension Sister  Lung cancer Sister 12       stage 4    Diabetes Other    Breast cancer Other    Heart disease Other    Colon cancer Other 59       nephew   Esophageal cancer Neg Hx    Rectal cancer Neg Hx    Stomach cancer Neg Hx    Amblyopia Neg Hx    Blindness Neg Hx    Cataracts Neg Hx    Glaucoma Neg Hx    Retinal detachment Neg Hx    Strabismus Neg Hx    Retinitis pigmentosa Neg Hx    No Known Allergies Current Outpatient Medications  Medication Sig Dispense Refill   acetaminophen (TYLENOL) 500 MG tablet Take 2 tablets (1,000 mg total) by mouth 3 (three) times daily. 30 tablet 0   albuterol (VENTOLIN HFA) 108 (90 Base) MCG/ACT inhaler TAKE 2 PUFFS BY MOUTH EVERY 6 HOURS AS NEEDED FOR WHEEZE OR SHORTNESS OF BREATH (Patient taking differently: Inhale 2 puffs into the lungs every 6 (six) hours as needed for wheezing or shortness of breath.) 18 each 6   Alcohol Swabs (DROPSAFE ALCOHOL PREP) 70 % PADS USE AS DIRECTED  AS NEEDED. 200 each 3   aspirin 325 MG tablet Take 1 tablet (325 mg total) by mouth daily for 28 days. 28 tablet 0   [START ON 08/13/2022] aspirin EC 81 MG tablet Take 1 tablet (81 mg total) by mouth daily. 30 tablet 11   atorvastatin (LIPITOR) 20 MG tablet TAKE 1 TABLET BY MOUTH EVERY DAY (Patient taking differently: Take 20 mg by mouth daily.) 90 tablet 1   Blood Glucose Monitoring Suppl (TRUE METRIX METER) w/Device KIT USE AS DIRECTED 1 kit 1   calcium-vitamin D (OSCAL WITH D) 500-5 MG-MCG tablet Take 1 tablet by mouth.     cephALEXin (KEFLEX) 500 MG capsule Take 500 mg by mouth daily at 12 noon.     cholecalciferol (VITAMIN D) 25 MCG (1000 UNIT) tablet Take 1,000 Units by mouth daily.     Cholecalciferol (VITAMIN D-3) 25 MCG (1000 UT) CAPS Take 1,000 Units by mouth daily. (Patient not taking:  Reported on 07/22/2022)     clonazePAM (KLONOPIN) 1 MG tablet TAKE 1 TABLET AT BEDTIME FOR RESTLESS LEGS SYNDROME (Patient taking differently: Take 0.5-1 mg by mouth at bedtime as needed (for restless legs).) 30 tablet 3   Continuous Blood Gluc Receiver (DEXCOM G6 RECEIVER) DEVI Use as directed w/ G6 sensor 1 each 0   Continuous Blood Gluc Sensor (DEXCOM G6 SENSOR) MISC Apply sensor every 10 days for continuous blood sugar readings 3 each 3   Continuous Blood Gluc Transmit (DEXCOM G6 TRANSMITTER) MISC Use as directed w/ G6 sensor 1 each 0   diclofenac Sodium (VOLTAREN) 1 % GEL APPLY 2 GRAMS TO AFFECTED AREA 4 TIMES A DAY (Patient taking differently: Apply 2 g topically 4 (four) times daily as needed (for pain- hands and knees).) 300 g 1   fenofibrate 160 MG tablet Take 1 tablet (160 mg total) by mouth daily. 90 tablet 10   furosemide (LASIX) 20 MG tablet TAKE 1 TABLET EVERY DAY 90 tablet 1   gabapentin (NEURONTIN) 300 MG capsule Take 3 capsules (900 mg total) by mouth 3 (three) times daily. 270 capsule 11   glucose blood (TRUE METRIX BLOOD GLUCOSE TEST) test strip Use as instructed to test blood sugar 4 times daily. Dx. E11.9 400 each 3   insulin aspart (NOVOLOG FLEXPEN) 100 UNIT/ML FlexPen  INJECT 25 UNITS SUBCUTANEOUSLY THREE TIMES DAILY WITH MEALS (Patient taking differently: Inject 25 Units into the skin 3 (three) times daily with meals.) 75 mL 0   insulin glargine (LANTUS SOLOSTAR) 100 UNIT/ML Solostar Pen INJECT 60 UNITS UNDER THE SKIN AT BEDTIME (Patient taking differently: Inject 60 Units into the skin at bedtime.) 60 mL 10   Insulin Pen Needle (DROPLET PEN NEEDLES) 31G X 8 MM MISC USE 4 TIMES DAILY 400 each 6   Lancets Super Thin 28G MISC Please use as directed to test sugars 4 times daily. Dx. E11.9 420 each 3   lansoprazole (PREVACID) 15 MG capsule Take 15 mg by mouth daily before breakfast.     levothyroxine (SYNTHROID) 50 MCG tablet TAKE 1 TABLET BY MOUTH EVERY DAY BEFORE BREAKFAST (Patient  taking differently: Take 50 mcg by mouth daily before breakfast.) 90 tablet 1   loperamide (IMODIUM A-D) 2 MG tablet Take 2 mg by mouth 4 (four) times daily as needed for diarrhea or loose stools.     Oxycodone HCl 10 MG TABS Take 1 tablet (10 mg total) by mouth every 6 (six) hours as needed. 28 tablet 0   primidone (MYSOLINE) 50 MG tablet TAKE  2   TABLETS IN THE AM  AND TAKE 1 IN THE PM  BY MOUTH TWICE A DAY (Patient taking differently: Take 50-100 mg by mouth See admin instructions. Take 100 mg by mouth in the morning and 50 mg in the evening) 270 tablet 0   sertraline (ZOLOFT) 25 MG tablet Take 1 tablet (25 mg total) by mouth daily. 30 tablet 3   traZODone (DESYREL) 100 MG tablet Take 1 tablet (100 mg total) by mouth at bedtime. 90 tablet 3   valsartan (DIOVAN) 320 MG tablet Take 1 tablet (320 mg total) by mouth daily. 90 tablet 2   verapamil (CALAN-SR) 120 MG CR tablet TAKE 1 TABLET BY MOUTH EVERYDAY AT BEDTIME (Patient taking differently: Take 120 mg by mouth at bedtime.) 90 tablet 1   Vibegron (GEMTESA) 75 MG TABS Take 1 tablet by mouth daily.     No current facility-administered medications for this visit.   No results found.  Review of Systems:   A ROS was performed including pertinent positives and negatives as documented in the HPI.   Musculoskeletal Exam:     Right shoulder incision is well-appearing without erythema or drainage.  Stable to flex and extend at the right elbow.  She fires all 3 heads of the deltoid.  Sensation is intact in axillary distribution.  Fires EPL as well as wrist extensors.  2+ radial pulse  Imaging:    AP right shoulder: Status post reverse shoulder arthroplasty without evidence of complication  I personally reviewed and interpreted the radiographs.   Assessment:   72 year old female is 2 weeks status post right shoulder reverse shoulder arthroplasty overall doing very well.  Because this is a reverse for fracture I will asked that she remain in  the sling for an additional 2 weeks.  She may come out of this for passive range of motion at this time 1 month postop.  That time she will begin following the reverse for fracture protocol.  Will plan on outpatient physical therapy.  Plan :    -Return to clinic 1 month for assessment      I personally saw and evaluated the patient, and participated in the management and treatment plan.  Vanetta Mulders, MD Attending Physician, Orthopedic Surgery  This document was dictated using  [employer] forces training and education officer. A reasonable attempt at proof reading has been made to minimize errors.

## 2022-07-27 ENCOUNTER — Telehealth (HOSPITAL_BASED_OUTPATIENT_CLINIC_OR_DEPARTMENT_OTHER): Payer: Self-pay | Admitting: Orthopaedic Surgery

## 2022-07-27 NOTE — Telephone Encounter (Signed)
RC to Lacey [name] stating pt does not need home health services. Outpatient PT scheduled for 08/05/22. No further questions asked.

## 2022-07-27 NOTE — Telephone Encounter (Signed)
Patient reported she was told she doe not need home health care anymore.Herbert Deaner [phone] [facility] would like to verify this and would like one last visit

## 2022-07-30 ENCOUNTER — Telehealth: Payer: Self-pay | Admitting: Family Medicine

## 2022-07-30 ENCOUNTER — Other Ambulatory Visit (HOSPITAL_COMMUNITY): Payer: Self-pay

## 2022-07-30 NOTE — Telephone Encounter (Signed)
Pt's PT called and wanted to let us know pt is being discharged on 07/30/22. He states that pt BS was 47 last night but pt got it up to 147.  Her BS was 120 this morning per therapist Spoke to the pt and she states she did not eat enough last night and feels fine this morning.   Bs is 130 now . Starts outpatient therapy next week and she is nervous about moving her shoulder .  Any recommendations ?

## 2022-07-30 NOTE — Telephone Encounter (Signed)
Caller name: [facility]  On DPR?: Yes  Call back number: [phone]  Provider they see: Midge Minium, MD  Reason for call: Discharging 3/28//24 BS low 47 symptoms she was able to bring it up to 147 before bed.

## 2022-07-30 NOTE — Telephone Encounter (Signed)
Informed pt of Lacey [name]'s message above . Pt expressed verbal understanding

## 2022-08-04 ENCOUNTER — Ambulatory Visit: Payer: Self-pay | Admitting: Family Medicine

## 2022-08-05 ENCOUNTER — Other Ambulatory Visit: Payer: Self-pay

## 2022-08-05 ENCOUNTER — Encounter: Payer: Self-pay | Admitting: Physical Therapy

## 2022-08-05 ENCOUNTER — Ambulatory Visit: Payer: Medicare HMO | Admitting: Physical Therapy

## 2022-08-05 ENCOUNTER — Telehealth: Payer: Self-pay

## 2022-08-05 DIAGNOSIS — M6281 Muscle weakness (generalized): Secondary | ICD-10-CM

## 2022-08-05 DIAGNOSIS — R293 Abnormal posture: Secondary | ICD-10-CM | POA: Diagnosis not present

## 2022-08-05 DIAGNOSIS — M25611 Stiffness of right shoulder, not elsewhere classified: Secondary | ICD-10-CM

## 2022-08-05 DIAGNOSIS — M25511 Pain in right shoulder: Secondary | ICD-10-CM

## 2022-08-05 MED ORDER — OXYCODONE HCL 10 MG PO TABS: 10.0000 mg | ORAL_TABLET | Freq: Four times a day (QID) | ORAL | 0 refills | Status: DC | PRN

## 2022-08-05 NOTE — Telephone Encounter (Signed)
Pt called and states she started Outpatient PT today and doing this 3 x a week. Therapist told pt to call and see if we could give her a pain medication to take prior to PT . She states it hurts so bad during PT  . Patient was taking Oxycodone from the surgery and she is out .  Can we prescribe pt something ?

## 2022-08-05 NOTE — Telephone Encounter (Signed)
I informed pt that we sent the Rx to the pharmacy

## 2022-08-05 NOTE — Therapy (Signed)
OUTPATIENT PHYSICAL THERAPY SHOULDER EVALUATION   Patient Name: Lacey [name] MRN: [id number] DOB:[birth year], 72 y.o., female Today's Date: 08/05/2022  END OF SESSION:  PT End of Session - 08/05/22 1118     Visit Number 1    Number of Visits 25    Authorization Type [employer] MCR    Authorization Time Period 08/05/22 to 09/30/22    Progress Note Due on Visit 10    PT Start Time 1015    PT Stop Time 1053    PT Time Calculation (min) 38 min    Activity Tolerance Patient limited by pain;Other (comment)   and anxiety   Behavior During Therapy [facility] for tasks assessed/performed;Anxious             Past Medical History:  Diagnosis Date   Adenomatous colon polyp    hyperplastic   Anemia    Anginal pain    not recent   Arthritis    NECK, KNEES, FINGERS, TOES   Atrial tachycardia CARDIOLOGIST - DR Caryl Comes (LAST VISIT AUG 2012)   Echo 12/11: EF 55-60%, Mild LVH, grade 1 diast dysfxn;   holter 1/12: ATach   Atypical chest pain    a. 07/2004 Cath: Clean cors;  b. 04/2010 Myoview: EF 63%, no ischemia   Blood transfusion without reported diagnosis 1969   CHF (congestive heart failure)    Chronic kidney disease    left kidney small    Coronary artery disease    Diabetes mellitus, type 2    ORAL AND INSULIN MEDS (LAST A1C  7.3)   Diverticulosis    Erythema CURRENT-- CLOSED ABD. WALL ABSCESS   PER PCP NOTE (03-31-11)- MRSA-- TAKES DOXYCYCLINE   GERD (gastroesophageal reflux disease)    CONTROLLED W/ PREVACID   History of kidney stones 2012   Hyperlipidemia    Hypertension    Insomnia    TAKES MEDS PRN   Neuropathy, peripheral    Obesity (BMI 30-39.9) 02/19/2015   Pneumonia    as child   Post op infection 11/07/2012   left bunionectomy   Restless leg syndrome    Sepsis, urinary HISTORY - 2004   Sleep apnea    no cpap    Thyroid disease    TIA (transient ischemic attack)    "mini strokes" 2023   Tremor    Vitamin D deficiency    Past Surgical History:  Procedure Laterality  Date   ABDOMINAL HYSTERECTOMY  1995   ovaries remain   BUNIONECTOMY Left 10/24/2012   BUNIONECTOMY Right 05/17/2017   CARDIAC CATHETERIZATION  07/10/2004   CARPAL TUNNEL RELEASE  2000   RIGHT   CATARACT EXTRACTION, BILATERAL     CERVICAL FUSION  10/12/2007   C5 - 7   COLONOSCOPY     CYSTOSCOPY WITH RETROGRADE PYELOGRAM, URETEROSCOPY AND STENT PLACEMENT Left 11/28/2012   Procedure: LEFT RETROGRADE PYELOGRAM, LEFT URETEROSCOPY, ;  Surgeon: Claybon Jabs, MD;  Location: [facility];  Service: Urology;  Laterality: Left;   CYSTOSCOPY/RETROGRADE/URETEROSCOPY/STONE EXTRACTION WITH BASKET  X2 2004 & 2009   LEFT    GASTRIC BYPASS  1981   KNEE ARTHROSCOPY  12/2010   LEFT   LAPAROSCOPIC CHOLECYSTECTOMY  2001   LEFT HEART CATH AND CORONARY ANGIOGRAPHY N/A 12/03/2021   Procedure: LEFT HEART CATH AND CORONARY ANGIOGRAPHY;  Surgeon: Jettie Booze, MD;  Location: [place] [facility];  Service: Cardiovascular;  Laterality: N/A;   left thumb joint surgery  2013   PERCUTANEOUS NEPHROSTOLITHOTOMY  02/27/2011   LEFT  POLYPECTOMY     REVERSE SHOULDER ARTHROPLASTY Right 07/12/2022   Procedure: REVERSE SHOULDER ARTHROPLASTY;  Surgeon: Vanetta Mulders, MD;  Location: [place];  Service: Orthopedics;  Laterality: Right;   RIGHT THUMB JOINT SURG.  09/2009   TRIGGER FINGER RELEASE  2010   RIGHT THUMB   UPPER GASTROINTESTINAL ENDOSCOPY     URETERAL STENT PLACEMENT  X2  2004  &  2009   LEFT   URETEROSCOPY  04/06/2011   Procedure: URETEROSCOPY;  Surgeon: Claybon Jabs, MD;  Location: [facility];  Service: Urology;  Laterality: Left;  L Ureteroscopy Laser Litho & Stent    Patient Active Problem List   Diagnosis Date Noted   Closed 3-part fracture of proximal end of right humerus 07/11/2022   Shoulder dislocation 07/11/2022   Abnormal stress test    Atypical chest pain 08/05/2021   Bradycardia 06/20/2021   Abnormal gait 03/17/2021   Acquired deformity of lower leg 03/17/2021   Urinary  urgency 11/14/2020   Paresthesia 08/15/2020   Right lumbar pain 08/15/2020   Tremor 08/15/2020   Hypoglycemia associated with diabetes 02/29/2020   Essential (primary) hypertension 08/10/2019   Chronic neck pain 06/23/2016   Radicular low back pain 07/12/2015   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 without macular edema 04/24/2015   Severe obesity (BMI >= 40) 02/19/2015   OSA (obstructive sleep apnea) 10/23/2014   Excessive daytime sleepiness 10/23/2014   Hypothyroidism 09/07/2014   Tobacco abuse 01/15/2014   Recurrent UTI 09/06/2013   Orthostatic hypotension 10/05/2012   General medical examination 06/22/2011   Atrial tachycardia 11/25/2010   Pre-operative cardiovascular examination 11/25/2010   Chronic diarrhea 08/25/2010   KNEE PAIN 07/14/2010   Knee pain [id number]   DIASTOLIC HEART FAILURE, CHRONIC 06/09/2010   PERSONAL HISTORY OF COLONIC POLYPS 05/01/2010   Vitamin D deficiency 01/20/2010   RESTLESS LEG SYNDROME 01/20/2010   Insomnia 01/20/2010   Restless leg syndrome 01/20/2010   Hyperlipidemia associated with type 2 diabetes mellitus 01/08/2009   Anemia 01/08/2009   GERD 01/08/2009   Benign hypertensive heart disease with heart failure 01/08/2009    PCP: Annye Asa MD   REFERRING PROVIDER: Vanetta Mulders, MD  REFERRING DIAG: [phone] (ICD-10-CM) - Status post reverse arthroplasty of right shoulder  THERAPY DIAG:  Acute pain of right shoulder  Stiffness of right shoulder, not elsewhere classified  Muscle weakness (generalized)  Abnormal posture  Rationale for Evaluation and Treatment: Rehabilitation  ONSET DATE: 07/24/2022  SUBJECTIVE:  SUBJECTIVE STATEMENT:  Had a fall around March 9th at 5:30am, my pet had to go  outside and it was raining, went outside and had to walk up/down a ramp. I'm not sure if the boards were slippery or what but I went down hard. Eventually got back in the house. I don't know much of anything that anyone said after that, I don't remember much of anything else until they were rolling me into surgery. Things have been doing well since surgery I'm just frustrated because I can't do anything with this arm. Haven't been moving the shoulder, but I have been exercising elbow/hand/wrist. Had HHPT that worked on just general getting around, completely done with this.   Hand dominance: Right  PERTINENT HISTORY: 72 year old female is 2 weeks status post right shoulder reverse shoulder arthroplasty overall doing very well. Because this is a reverse for fracture I will asked that she remain in the sling for an additional 2 weeks. She may come out of this for passive range of motion at this time 1 month postop. That time she will begin following the reverse for fracture protocol. Will plan on outpatient physical therapy.  PAIN:  Are you having pain? Yes: NPRS scale: 4/10 Pain location: upper arm, shoulder is OK  Pain description: numbness/burning  Aggravating factors: trying to bathe/get dressed, reaching into scratch Relieving factors: "I don't get comfortable, it always lets me know its there"   PRECAUTIONS: Other: reverse total shoulder for fracture protocol   WEIGHT BEARING RESTRICTIONS: Yes NWB surgical LE   FALLS:  Has patient fallen in last 6 months? Yes. Number of falls 2- one fall on ramp in the rain that led to fracture/surgery, other fall was the night she came home from the hospital and just passed out; FOF (+)  LIVING ENVIRONMENT: Lives with: lives with their spouse Lives in: House/apartment Stairs: No Has following equipment at home: Single point cane, Environmental consultant - 2 wheeled, Crutches, Wheelchair (manual), bed side commode, and Ramped entry + walk-in shower, shower bench    OCCUPATION: Retired   PLOF: Independent, Independent with basic ADLs, Independent with gait, and Independent with transfers  PATIENT GOALS: move my arm, be able to wash dishes, brush teeth R handed- generally improve level of function   NEXT MD VISIT:   OBJECTIVE:   DIAGNOSTIC FINDINGS:    PATIENT SURVEYS:  FOTO 32, predicted 26  COGNITION: Overall cognitive status: Within functional limits for tasks assessed and very anxious about therapy      SENSATION: Reports numbness and burning in upper arm since surgery   POSTURE: Rounded shoulders, forward head, increased thoracic kyphosis   UPPER EXTREMITY ROM:   Passive ROM Right eval Left eval  Shoulder flexion 45-50*   Shoulder extension    Shoulder scaption <20*   Shoulder adduction    Shoulder internal rotation -10 to -15*   Shoulder external rotation    Elbow flexion About 75% limited PROM, very guarded    Elbow extension WNL PROM but very guarded    Wrist flexion    Wrist extension    Wrist ulnar deviation    Wrist radial deviation    Wrist pronation    Wrist supination    (Blank rows = not tested)  UPPER EXTREMITY MMT:  MMT Right eval Left eval  Shoulder flexion    Shoulder extension    Shoulder abduction    Shoulder adduction    Shoulder internal rotation    Shoulder external rotation    Middle trapezius  Lower trapezius    Elbow flexion    Elbow extension    Wrist flexion    Wrist extension    Wrist ulnar deviation    Wrist radial deviation    Wrist pronation    Wrist supination    Grip strength (lbs)    (Blank rows = not tested)- DNT at eval due to limitations in protocol  SHOULDER SPECIAL TESTS:  JOINT MOBILITY TESTING:    PALPATION:     TODAY'S TREATMENT:                                                                                                                                         DATE:   Eval   Objective measures/education    PATIENT EDUCATION: Education  details: exam findings, POC, HEP; extensive education on typical recovery after shoulder surgery, also benefit of taking pain medicine prior to PT to make skilled interventions more tolerable  Person educated: Patient Education method: Explanation, Demonstration, and Handouts Education comprehension: verbalized understanding, returned demonstration, and needs further education  HOME EXERCISE PROGRAM: Access Code: [email or web address] URL: [email or web address] Date: 08/05/2022 Prepared by: Deniece Ree  Exercises - Seated Shoulder Pendulum Exercise  - 3-5 x daily - 7 x weekly - 1 sets - 20 reps - Gentle Horizontal Shoulder Pendulum  - 3-5 x daily - 7 x weekly - 1 sets - 20 reps - Gentle Vertical Shoulder Pendulum  - 3-5 x daily - 7 x weekly - 1 sets - 20 reps  ASSESSMENT:  CLINICAL IMPRESSION: Patient is a 72 y.o. F who was seen today for physical therapy evaluation and treatment for skilled PT care after receiving reverse TSA for fracture on 07/12/22. Of note, she was very anxious and guarded during evaluation, often tensing up excessively and crying out when PT even tried to change hand position for PROM. Able to get some passive movement, but very limited, unsure how much was true MSK/post-op limitations versus pt guarding/anxiety. See above for specific objective measures/impairments. Demonstrated and practiced seated and standing pendulums, she was able to complete all of these with decent form. Will need extensive PT moving forward to attempt to regain PLOF, educated as such.   OBJECTIVE IMPAIRMENTS: decreased ROM, decreased strength, hypomobility, increased edema, increased fascial restrictions, increased muscle spasms, impaired flexibility, impaired UE functional use, postural dysfunction, obesity, and pain.   ACTIVITY LIMITATIONS: carrying, lifting, bed mobility, bathing, toileting, dressing, reach over head, and caring for others  PARTICIPATION LIMITATIONS: cleaning, laundry,  driving, shopping, community activity, and yard work  PERSONAL FACTORS: Age, Behavior pattern, Education, Fitness, Past/current experiences, Sex, Social background, and Time since onset of injury/illness/exacerbation are also affecting patient's functional outcome.   REHAB POTENTIAL: Good  CLINICAL DECISION MAKING: Stable/uncomplicated  EVALUATION COMPLEXITY: Low   GOALS: Goals reviewed with patient? Yes  SHORT TERM GOALS: Target date: 09/02/2022    Will be compliant with appropriate progressive HEP  Baseline: Goal status: INITIAL  2.  R shoulder flexion and scaption PROM to be at least 100 degrees, R shoulder ER PROM to be at least 20 degrees at 0* ABD  Baseline:  Goal status: INITIAL  3.  R shoulder flexion and scaption AAROM to be at least 90 degrees, R shoulder ER AAROM to reach neutral  Baseline:  Goal status: INITIAL  4.  Will demonstrate better functional posture/postural mechanics  Baseline:  Goal status: INITIAL   LONG TERM GOALS: Target date: 09/30/2022    R shoulder flexion and scaption AROM to be at least 120 degrees  Baseline:  Goal status: INITIAL  2.  R shoulder ER AROM to be at least 30 degrees at 45* ABD  Baseline:  Goal status: INITIAL  3.  Will be able to reach L5 level with active FIR no increased pain  Baseline:  Goal status: INITIAL  4.  Will be able to perform all dressing/bathing tasks on a Mod(I) basis no increase in pain  Baseline:  Goal status: INITIAL  5.  Will be able to reach overhead to put dishes in the cabinet without increase in pain  Baseline:  Goal status: INITIAL   PLAN:  PT FREQUENCY: 3x/week  PT DURATION: 8 weeks  PLANNED INTERVENTIONS: Therapeutic exercises, Therapeutic activity, Neuromuscular re-education, Balance training, Gait training, Patient/Family education, Self Care, Joint mobilization, Aquatic Therapy, Dry Needling, Electrical stimulation, Cryotherapy, Moist heat, Taping, Ultrasound, Ionotophoresis 4mg /ml  Dexamethasone, Manual therapy, and Re-evaluation  PLAN FOR NEXT SESSION: per protocol   Deniece Ree PT DPT PN2

## 2022-08-05 NOTE — Telephone Encounter (Signed)
Prescription for Oxycodone sent to Pharmacy

## 2022-08-06 ENCOUNTER — Ambulatory Visit: Payer: Medicare HMO | Admitting: Physical Therapy

## 2022-08-06 ENCOUNTER — Encounter: Payer: Self-pay | Admitting: Physical Therapy

## 2022-08-06 DIAGNOSIS — M6281 Muscle weakness (generalized): Secondary | ICD-10-CM

## 2022-08-06 DIAGNOSIS — R293 Abnormal posture: Secondary | ICD-10-CM | POA: Diagnosis not present

## 2022-08-06 DIAGNOSIS — M25511 Pain in right shoulder: Secondary | ICD-10-CM

## 2022-08-06 DIAGNOSIS — M25611 Stiffness of right shoulder, not elsewhere classified: Secondary | ICD-10-CM | POA: Diagnosis not present

## 2022-08-06 NOTE — Therapy (Signed)
OUTPATIENT PHYSICAL THERAPY SHOULDER TREATMENT   Patient Name: Lacey [name] MRN: [id number] DOB:1950/10/24, 72 y.o., female Today's Date: 08/06/2022  END OF SESSION:  PT End of Session - 08/06/22 1152     Visit Number 2    Number of Visits 25    Authorization Type [employer] [facility]    Authorization Time Period 08/05/22 to 09/30/22    Progress Note Due on Visit 10    PT Start Time 1150   pt a few minutes late   PT Stop Time 1228    PT Time Calculation (min) 38 min    Activity Tolerance Patient tolerated treatment well    Behavior During Therapy [facility] for tasks assessed/performed;Anxious              Past Medical History:  Diagnosis Date   Adenomatous colon polyp    hyperplastic   Anemia    Anginal pain    not recent   Arthritis    NECK, KNEES, FINGERS, TOES   Atrial tachycardia CARDIOLOGIST - DR Caryl Comes (LAST VISIT AUG 2012)   Echo 12/11: EF 55-60%, Mild LVH, grade 1 diast dysfxn;   holter 1/12: ATach   Atypical chest pain    a. 07/2004 Cath: Clean cors;  b. 04/2010 Myoview: EF 63%, no ischemia   Blood transfusion without reported diagnosis 1969   CHF (congestive heart failure)    Chronic kidney disease    left kidney small    Coronary artery disease    Diabetes mellitus, type 2    ORAL AND INSULIN MEDS (LAST A1C  7.3)   Diverticulosis    Erythema CURRENT-- CLOSED ABD. WALL ABSCESS   PER PCP NOTE (03-31-11)- MRSA-- TAKES DOXYCYCLINE   GERD (gastroesophageal reflux disease)    CONTROLLED W/ PREVACID   History of kidney stones 2012   Hyperlipidemia    Hypertension    Insomnia    TAKES MEDS PRN   Neuropathy, peripheral    Obesity (BMI 30-39.9) 02/19/2015   Pneumonia    as child   Post op infection 11/07/2012   left bunionectomy   Restless leg syndrome    Sepsis, urinary HISTORY - 2004   Sleep apnea    no cpap    Thyroid disease    TIA (transient ischemic attack)    "mini strokes" 2023   Tremor    Vitamin D deficiency    Past Surgical History:  Procedure  Laterality Date   ABDOMINAL HYSTERECTOMY  1995   ovaries remain   BUNIONECTOMY Left 10/24/2012   BUNIONECTOMY Right 05/17/2017   CARDIAC CATHETERIZATION  07/10/2004   CARPAL TUNNEL RELEASE  2000   RIGHT   CATARACT EXTRACTION, BILATERAL     CERVICAL FUSION  10/12/2007   C5 - 7   COLONOSCOPY     CYSTOSCOPY WITH RETROGRADE PYELOGRAM, URETEROSCOPY AND STENT PLACEMENT Left 11/28/2012   Procedure: LEFT RETROGRADE PYELOGRAM, LEFT URETEROSCOPY, ;  Surgeon: Claybon Jabs, MD;  Location: [facility];  Service: Urology;  Laterality: Left;   CYSTOSCOPY/RETROGRADE/URETEROSCOPY/STONE EXTRACTION WITH BASKET  X2 2004 & 2009   LEFT    GASTRIC BYPASS  1981   KNEE ARTHROSCOPY  12/2010   LEFT   LAPAROSCOPIC CHOLECYSTECTOMY  2001   LEFT HEART CATH AND CORONARY ANGIOGRAPHY N/A 12/03/2021   Procedure: LEFT HEART CATH AND CORONARY ANGIOGRAPHY;  Surgeon: Jettie Booze, MD;  Location: [facility];  Service: Cardiovascular;  Laterality: N/A;   left thumb joint surgery  2013   PERCUTANEOUS NEPHROSTOLITHOTOMY  02/27/2011  LEFT   POLYPECTOMY     REVERSE SHOULDER ARTHROPLASTY Right 07/12/2022   Procedure: REVERSE SHOULDER ARTHROPLASTY;  Surgeon: Vanetta Mulders, MD;  Location: [place];  Service: Orthopedics;  Laterality: Right;   RIGHT THUMB JOINT SURG.  09/2009   TRIGGER FINGER RELEASE  2010   RIGHT THUMB   UPPER GASTROINTESTINAL ENDOSCOPY     URETERAL STENT PLACEMENT  X2  2004  &  2009   LEFT   URETEROSCOPY  04/06/2011   Procedure: URETEROSCOPY;  Surgeon: Claybon Jabs, MD;  Location: [facility];  Service: Urology;  Laterality: Left;  L Ureteroscopy Laser Litho & Stent    Patient Active Problem List   Diagnosis Date Noted   Closed 3-part fracture of proximal end of right humerus 07/11/2022   Shoulder dislocation 07/11/2022   Abnormal stress test    Atypical chest pain 08/05/2021   Bradycardia 06/20/2021   Abnormal gait 03/17/2021   Acquired deformity of lower leg 03/17/2021    Urinary urgency 11/14/2020   Paresthesia 08/15/2020   Right lumbar pain 08/15/2020   Tremor 08/15/2020   Hypoglycemia associated with diabetes 02/29/2020   Essential (primary) hypertension 08/10/2019   Chronic neck pain 06/23/2016   Radicular low back pain 07/12/2015   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 without macular edema 04/24/2015   Severe obesity (BMI >= 40) 02/19/2015   OSA (obstructive sleep apnea) 10/23/2014   Excessive daytime sleepiness 10/23/2014   Hypothyroidism 09/07/2014   Tobacco abuse 01/15/2014   Recurrent UTI 09/06/2013   Orthostatic hypotension 10/05/2012   General medical examination 06/22/2011   Atrial tachycardia 11/25/2010   Pre-operative cardiovascular examination 11/25/2010   Chronic diarrhea 08/25/2010   KNEE PAIN 07/14/2010   Knee pain [id number]   DIASTOLIC HEART FAILURE, CHRONIC 06/09/2010   PERSONAL HISTORY OF COLONIC POLYPS 05/01/2010   Vitamin D deficiency 01/20/2010   RESTLESS LEG SYNDROME 01/20/2010   Insomnia 01/20/2010   Restless leg syndrome 01/20/2010   Hyperlipidemia associated with type 2 diabetes mellitus 01/08/2009   Anemia 01/08/2009   GERD 01/08/2009   Benign hypertensive heart disease with heart failure 01/08/2009    PCP: Annye Asa MD   REFERRING PROVIDER: Vanetta Mulders, MD  REFERRING DIAG: [phone] (ICD-10-CM) - Status post reverse arthroplasty of right shoulder  THERAPY DIAG:  Acute pain of right shoulder  Stiffness of right shoulder, not elsewhere classified  Muscle weakness (generalized)  Abnormal posture  Rationale for Evaluation and Treatment: Rehabilitation  ONSET DATE: 07/24/2022  SUBJECTIVE:  SUBJECTIVE STATEMENT:  Things were really sore after yesterday, but I sat  down and did some of the exercises you gave me. Its really sore in the front of my shoulder, having some numbness that comes and goes down arm. Still really nervous about moving arm, but slept well last night. I took a pain pill before I came today.   Hand dominance: Right  PERTINENT HISTORY: 72 year old female is 2 weeks status post right shoulder reverse shoulder arthroplasty overall doing very well. Because this is a reverse for fracture I will asked that she remain in the sling for an additional 2 weeks. She may come out of this for passive range of motion at this time 1 month postop. That time she will begin following the reverse for fracture protocol. Will plan on outpatient physical therapy.  PAIN:  Are you having pain? Yes: NPRS scale: 3-4/10 Pain location: front of my shoulder   Pain description: soreness  Aggravating factors: trying to bathe/get dressed, reaching into scratch Relieving factors: "I don't get comfortable, it always lets me know its there"   PRECAUTIONS: Other: reverse total shoulder for fracture protocol   WEIGHT BEARING RESTRICTIONS: Yes NWB surgical LE   FALLS:  Has patient fallen in last 6 months? Yes. Number of falls 2- one fall on ramp in the rain that led to fracture/surgery, other fall was the night she came home from the hospital and just passed out; FOF (+)  LIVING ENVIRONMENT: Lives with: lives with their spouse Lives in: House/apartment Stairs: No Has following equipment at home: Single point cane, Environmental consultant - 2 wheeled, Crutches, Wheelchair (manual), bed side commode, and Ramped entry + walk-in shower, shower bench   OCCUPATION: Retired   PLOF: Independent, Independent with basic ADLs, Independent with gait, and Independent with transfers  PATIENT GOALS: move my arm, be able to wash dishes, brush teeth R handed- generally improve level of function   NEXT MD VISIT:   OBJECTIVE:   DIAGNOSTIC FINDINGS:    PATIENT SURVEYS:  FOTO 32, predicted  54  COGNITION: Overall cognitive status: Within functional limits for tasks assessed and very anxious about therapy      SENSATION: Reports numbness and burning in upper arm since surgery   POSTURE: Rounded shoulders, forward head, increased thoracic kyphosis   UPPER EXTREMITY ROM:   Passive ROM Right eval Left eval  Shoulder flexion 45-50*   Shoulder extension    Shoulder scaption <20*   Shoulder adduction    Shoulder internal rotation    Shoulder external rotation -10* to -15* (edited 08/06/22, noted that I put ER value in IR column KEU)   Elbow flexion About 75% limited PROM, very guarded    Elbow extension WNL PROM but very guarded    Wrist flexion    Wrist extension    Wrist ulnar deviation    Wrist radial deviation    Wrist pronation    Wrist supination    (Blank rows = not tested)  UPPER EXTREMITY MMT:  MMT Right eval Left eval  Shoulder flexion    Shoulder extension    Shoulder abduction    Shoulder adduction    Shoulder internal rotation    Shoulder external rotation    Middle trapezius    Lower trapezius    Elbow flexion    Elbow extension    Wrist flexion    Wrist extension    Wrist ulnar deviation    Wrist radial deviation    Wrist pronation    Wrist  supination    Grip strength (lbs)    (Blank rows = not tested)- DNT at eval due to limitations in protocol  SHOULDER SPECIAL TESTS:  JOINT MOBILITY TESTING:    PALPATION:     TODAY'S TREATMENT:                                                                                                                                         DATE:   08/06/22  TherEx  PROM/stretches within limits of protocol:  - flexion to 90*  - scaption to 90* - ER to 30* to 0*  ABD   Attempted flexion AAROM with light golf club, unable due to weakness  Attempted flexion AAROM with L UE holding right, unable to move arm more than 10-20 degrees     Eval   Objective measures/education    PATIENT  EDUCATION: Education details: exam findings, POC, HEP; extensive education on typical recovery after shoulder surgery, also benefit of taking pain medicine prior to PT to make skilled interventions more tolerable  Person educated: Patient Education method: Explanation, Demonstration, and Handouts Education comprehension: verbalized understanding, returned demonstration, and needs further education  HOME EXERCISE PROGRAM: Access Code: [email or web address] URL: [email or web address] Date: 08/05/2022 Prepared by: Deniece Ree  Exercises - Seated Shoulder Pendulum Exercise  - 3-5 x daily - 7 x weekly - 1 sets - 20 reps - Gentle Horizontal Shoulder Pendulum  - 3-5 x daily - 7 x weekly - 1 sets - 20 reps - Gentle Vertical Shoulder Pendulum  - 3-5 x daily - 7 x weekly - 1 sets - 20 reps  ASSESSMENT:  CLINICAL IMPRESSION:  Minette arrives doing OK, took her pain medicine prior to therapy today. Continued focus on PROM per post-op protocol today. Still very anxious about moving shoulder, we took our time with all movements today and performed PROM only to tolerance but it was much improved overall today with pain medicine in her system. Tried some flexion AAROM in supine, unable to perform through meaningful ROM. Will continue to progress as tolerated.    OBJECTIVE IMPAIRMENTS: decreased ROM, decreased strength, hypomobility, increased edema, increased fascial restrictions, increased muscle spasms, impaired flexibility, impaired UE functional use, postural dysfunction, obesity, and pain.   ACTIVITY LIMITATIONS: carrying, lifting, bed mobility, bathing, toileting, dressing, reach over head, and caring for others  PARTICIPATION LIMITATIONS: cleaning, laundry, driving, shopping, community activity, and yard work  PERSONAL FACTORS: Age, Behavior pattern, Education, Fitness, Past/current experiences, Sex, Social background, and Time since onset of injury/illness/exacerbation are also affecting  patient's functional outcome.   REHAB POTENTIAL: Good  CLINICAL DECISION MAKING: Stable/uncomplicated  EVALUATION COMPLEXITY: Low   GOALS: Goals reviewed with patient? Yes  SHORT TERM GOALS: Target date: 09/02/2022    Will be compliant with appropriate progressive HEP  Baseline: Goal status: INITIAL  2.  R shoulder flexion and scaption PROM to be at least 100 degrees, R  shoulder ER PROM to be at least 20 degrees at 0* ABD  Baseline:  Goal status: INITIAL  3.  R shoulder flexion and scaption AAROM to be at least 90 degrees, R shoulder ER AAROM to reach neutral  Baseline:  Goal status: INITIAL  4.  Will demonstrate better functional posture/postural mechanics  Baseline:  Goal status: INITIAL   LONG TERM GOALS: Target date: 09/30/2022    R shoulder flexion and scaption AROM to be at least 120 degrees  Baseline:  Goal status: INITIAL  2.  R shoulder ER AROM to be at least 30 degrees at 45* ABD  Baseline:  Goal status: INITIAL  3.  Will be able to reach L5 level with active FIR no increased pain  Baseline:  Goal status: INITIAL  4.  Will be able to perform all dressing/bathing tasks on a Mod(I) basis no increase in pain  Baseline:  Goal status: INITIAL  5.  Will be able to reach overhead to put dishes in the cabinet without increase in pain  Baseline:  Goal status: INITIAL   PLAN:  PT FREQUENCY: 3x/week  PT DURATION: 8 weeks  PLANNED INTERVENTIONS: Therapeutic exercises, Therapeutic activity, Neuromuscular re-education, Balance training, Gait training, Patient/Family education, Self Care, Joint mobilization, Aquatic Therapy, Dry Needling, Electrical stimulation, Cryotherapy, Moist heat, Taping, Ultrasound, Ionotophoresis 4mg /ml Dexamethasone, Manual therapy, and Re-evaluation  PLAN FOR NEXT SESSION: per protocol. Need to problem solve the best way for her to [facility] at home (supine didn't work well)- maybe try table top slides and add to HEP if successful?    Deniece Ree PT DPT PN2

## 2022-08-07 ENCOUNTER — Ambulatory Visit: Payer: Medicare HMO | Admitting: Rehabilitative and Restorative Service Providers"

## 2022-08-07 ENCOUNTER — Encounter: Payer: Self-pay | Admitting: Rehabilitative and Restorative Service Providers"

## 2022-08-07 DIAGNOSIS — M25611 Stiffness of right shoulder, not elsewhere classified: Secondary | ICD-10-CM

## 2022-08-07 DIAGNOSIS — R293 Abnormal posture: Secondary | ICD-10-CM

## 2022-08-07 DIAGNOSIS — M6281 Muscle weakness (generalized): Secondary | ICD-10-CM | POA: Diagnosis not present

## 2022-08-07 DIAGNOSIS — M25511 Pain in right shoulder: Secondary | ICD-10-CM | POA: Diagnosis not present

## 2022-08-07 NOTE — Therapy (Signed)
OUTPATIENT PHYSICAL THERAPY SHOULDER TREATMENT   Patient Name: Lacey [name] MRN: [id number] DOB:12-12-1950, 72 y.o., female Today's Date: 08/07/2022  END OF SESSION:  PT End of Session - 08/07/22 1450     Visit Number 3    Number of Visits 25    Authorization Type [employer] [facility]    Authorization Time Period 08/05/22 to 09/30/22    Progress Note Due on Visit 10    PT Start Time 1145    PT Stop Time 1228    PT Time Calculation (min) 43 min    Activity Tolerance Patient tolerated treatment well;No increased pain;Patient limited by pain    Behavior During Therapy [facility] for tasks assessed/performed             Past Medical History:  Diagnosis Date   Adenomatous colon polyp    hyperplastic   Anemia    Anginal pain    not recent   Arthritis    NECK, KNEES, FINGERS, TOES   Atrial tachycardia CARDIOLOGIST - DR Graciela Husbands (LAST VISIT AUG 2012)   Echo 12/11: EF 55-60%, Mild LVH, grade 1 diast dysfxn;   holter 1/12: ATach   Atypical chest pain    a. 07/2004 Cath: Clean cors;  b. 04/2010 Myoview: EF 63%, no ischemia   Blood transfusion without reported diagnosis 1969   CHF (congestive heart failure)    Chronic kidney disease    left kidney small    Coronary artery disease    Diabetes mellitus, type 2    ORAL AND INSULIN MEDS (LAST A1C  7.3)   Diverticulosis    Erythema CURRENT-- CLOSED ABD. WALL ABSCESS   PER PCP NOTE (03-31-11)- MRSA-- TAKES DOXYCYCLINE   GERD (gastroesophageal reflux disease)    CONTROLLED W/ PREVACID   History of kidney stones 2012   Hyperlipidemia    Hypertension    Insomnia    TAKES MEDS PRN   Neuropathy, peripheral    Obesity (BMI 30-39.9) 02/19/2015   Pneumonia    as child   Post op infection 11/07/2012   left bunionectomy   Restless leg syndrome    Sepsis, urinary HISTORY - 2004   Sleep apnea    no cpap    Thyroid disease    TIA (transient ischemic attack)    "mini strokes" 2023   Tremor    Vitamin D deficiency    Past Surgical History:  Procedure  Laterality Date   ABDOMINAL HYSTERECTOMY  1995   ovaries remain   BUNIONECTOMY Left 10/24/2012   BUNIONECTOMY Right 05/17/2017   CARDIAC CATHETERIZATION  07/10/2004   CARPAL TUNNEL RELEASE  2000   RIGHT   CATARACT EXTRACTION, BILATERAL     CERVICAL FUSION  10/12/2007   C5 - 7   COLONOSCOPY     CYSTOSCOPY WITH RETROGRADE PYELOGRAM, URETEROSCOPY AND STENT PLACEMENT Left 11/28/2012   Procedure: LEFT RETROGRADE PYELOGRAM, LEFT URETEROSCOPY, ;  Surgeon: Garnett Farm, MD;  Location: [facility];  Service: Urology;  Laterality: Left;   CYSTOSCOPY/RETROGRADE/URETEROSCOPY/STONE EXTRACTION WITH BASKET  X2 2004 & 2009   LEFT    GASTRIC BYPASS  1981   KNEE ARTHROSCOPY  12/2010   LEFT   LAPAROSCOPIC CHOLECYSTECTOMY  2001   LEFT HEART CATH AND CORONARY ANGIOGRAPHY N/A 12/03/2021   Procedure: LEFT HEART CATH AND CORONARY ANGIOGRAPHY;  Surgeon: Corky Crafts, MD;  Location: [facility] INVASIVE CV LAB;  Service: Cardiovascular;  Laterality: N/A;   left thumb joint surgery  2013   PERCUTANEOUS NEPHROSTOLITHOTOMY  02/27/2011   LEFT  POLYPECTOMY     REVERSE SHOULDER ARTHROPLASTY Right 07/12/2022   Procedure: REVERSE SHOULDER ARTHROPLASTY;  Surgeon: Huel Cote, MD;  Location: [facility];  Service: Orthopedics;  Laterality: Right;   RIGHT THUMB JOINT SURG.  09/2009   TRIGGER FINGER RELEASE  2010   RIGHT THUMB   UPPER GASTROINTESTINAL ENDOSCOPY     URETERAL STENT PLACEMENT  X2  2004  &  2009   LEFT   URETEROSCOPY  04/06/2011   Procedure: URETEROSCOPY;  Surgeon: Garnett Farm, MD;  Location: [facility];  Service: Urology;  Laterality: Left;  L Ureteroscopy Laser Litho & Stent    Patient Active Problem List   Diagnosis Date Noted   Closed 3-part fracture of proximal end of right humerus 07/11/2022   Shoulder dislocation 07/11/2022   Abnormal stress test    Atypical chest pain 08/05/2021   Bradycardia 06/20/2021   Abnormal gait 03/17/2021   Acquired deformity of lower leg 03/17/2021    Urinary urgency 11/14/2020   Paresthesia 08/15/2020   Right lumbar pain 08/15/2020   Tremor 08/15/2020   Hypoglycemia associated with diabetes 02/29/2020   Essential (primary) hypertension 08/10/2019   Chronic neck pain 06/23/2016   Radicular low back pain 07/12/2015   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 without macular edema 04/24/2015   Severe obesity (BMI >= 40) 02/19/2015   OSA (obstructive sleep apnea) 10/23/2014   Excessive daytime sleepiness 10/23/2014   Hypothyroidism 09/07/2014   Tobacco abuse 01/15/2014   Recurrent UTI 09/06/2013   Orthostatic hypotension 10/05/2012   General medical examination 06/22/2011   Atrial tachycardia 11/25/2010   Pre-operative cardiovascular examination 11/25/2010   Chronic diarrhea 08/25/2010   KNEE PAIN 07/14/2010   Knee pain 07/14/2010   DIASTOLIC HEART FAILURE, CHRONIC 06/09/2010   PERSONAL HISTORY OF COLONIC POLYPS 05/01/2010   Vitamin D deficiency 01/20/2010   RESTLESS LEG SYNDROME 01/20/2010   Insomnia 01/20/2010   Restless leg syndrome 01/20/2010   Hyperlipidemia associated with type 2 diabetes mellitus 01/08/2009   Anemia 01/08/2009   GERD 01/08/2009   Benign hypertensive heart disease with heart failure 01/08/2009    PCP: Neena Rhymes MD   REFERRING PROVIDER: Huel Cote, MD  REFERRING DIAG: [phone] (ICD-10-CM) - Status post reverse arthroplasty of right shoulder  THERAPY DIAG:  Acute pain of right shoulder  Stiffness of right shoulder, not elsewhere classified  Muscle weakness (generalized)  Abnormal posture  Rationale for Evaluation and Treatment: Rehabilitation  ONSET DATE: 07/24/2022  SUBJECTIVE:  SUBJECTIVE STATEMENT: Surgery was March 10, fracture was the 9th of March.   Lacey [name] reports early compliance with her HEP.  She reports less muscle guarding but can still have sharp pain with right shoulder movement (exercises and ADLs).  Overall progress is noted since day 1 of her PT.       Hand dominance: Right  PERTINENT HISTORY: 72 year old female is 2 weeks status post right shoulder reverse shoulder arthroplasty overall doing very well. Because this is a reverse for fracture I will asked that she remain in the sling for an additional 2 weeks. She may come out of this for passive range of motion at this time 1 month postop. That time she will begin following the reverse for fracture protocol. Will plan on outpatient physical therapy.  PAIN:  Are you having pain? Yes: NPRS scale: 3-4/10 Pain location: front of my shoulder   Pain description: soreness  Aggravating factors: trying to bathe/get dressed, reaching into scratch Relieving factors: "I don't get comfortable, it always lets me know its there"   PRECAUTIONS: Other: reverse total shoulder for fracture protocol   WEIGHT BEARING RESTRICTIONS: Yes NWB surgical LE   FALLS:  Has patient fallen in last 6 months? Yes. Number of falls 2- one fall on ramp in the rain that led to fracture/surgery, other fall was the night she came home from the hospital and just passed out; FOF (+)  LIVING ENVIRONMENT: Lives with: lives with their spouse Lives in: House/apartment Stairs: No Has following equipment at home: Single point cane, Environmental consultantWalker - 2 wheeled, Crutches, Wheelchair (manual), bed side commode, and Ramped entry + walk-in shower, shower bench   OCCUPATION: Retired   PLOF: Independent, Independent with basic ADLs, Independent with gait, and Independent with transfers  PATIENT GOALS: move my arm, be able to wash dishes, brush teeth R handed- generally improve level of function   NEXT MD VISIT:   OBJECTIVE:   DIAGNOSTIC FINDINGS:    PATIENT SURVEYS:  FOTO 32, predicted [id number]  COGNITION: Overall cognitive  status: Within functional limits for tasks assessed and very anxious about therapy      SENSATION: Reports numbness and burning in upper arm since surgery   POSTURE: Rounded shoulders, forward head, increased thoracic kyphosis   UPPER EXTREMITY ROM:   Passive ROM Right eval Left eval  Shoulder flexion 45-50*   Shoulder extension    Shoulder scaption <20*   Shoulder adduction    Shoulder internal rotation    Shoulder external rotation -10* to -15* (edited 08/06/22, noted that I put ER value in IR column KEU)   Elbow flexion About 75% limited PROM, very guarded    Elbow extension WNL PROM but very guarded    Wrist flexion    Wrist extension    Wrist ulnar deviation    Wrist radial deviation    Wrist pronation    Wrist supination    (Blank rows = not tested)  UPPER EXTREMITY MMT:  MMT Right eval Left eval  Shoulder flexion    Shoulder extension    Shoulder abduction    Shoulder adduction    Shoulder internal rotation    Shoulder external rotation    Middle trapezius    Lower trapezius    Elbow flexion    Elbow extension    Wrist flexion    Wrist extension    Wrist ulnar deviation    Wrist radial deviation    Wrist pronation    Wrist supination    Grip strength (  lbs)    (Blank rows = not tested)- DNT at eval due to limitations in protocol  SHOULDER SPECIAL TESTS:  JOINT MOBILITY TESTING:    PALPATION:     TODAY'S TREATMENT:                                                                                                                                         DATE:  08/07/2022 Standing pendulums: Limited range side-to-side; forward and back and circles clockwise and counterclockwise 30 times each Active assisted supine arm raises (scapular protraction with palm facing in and left side assisting) 2 sets of 10 for 3 seconds Shoulder blade pinches 2 sets of 10 for 5 seconds  Manual PROM right shoulder flexion, IR and ER to 30 degrees  Ice right shoulder 5  minutes post exercises   08/06/22 TherEx  PROM/stretches within limits of protocol:  - flexion to 90*  - scaption to 90* - ER to 30* to 0*  ABD   Attempted flexion AAROM with light golf club, unable due to weakness  Attempted flexion AAROM with L UE holding right, unable to move arm more than 10-20 degrees    Eval   Objective measures/education    PATIENT EDUCATION: Education details: exam findings, POC, HEP; extensive education on typical recovery after shoulder surgery, also benefit of taking pain medicine prior to PT to make skilled interventions more tolerable  Person educated: Patient Education method: Explanation, Demonstration, and Handouts Education comprehension: verbalized understanding, returned demonstration, and needs further education  HOME EXERCISE PROGRAM: Access Code: [email or web address] URL: [email or web address] Date: 08/07/2022 Prepared by: Pauletta Brownsobert Samba Cumba  Exercises - Seated Shoulder Pendulum Exercise  - 3-5 x daily - 7 x weekly - 1 sets - 20 reps - Gentle Horizontal Shoulder Pendulum  - 3-5 x daily - 7 x weekly - 1 sets - 20 reps - Gentle Vertical Shoulder Pendulum  - 3-5 x daily - 7 x weekly - 1 sets - 20 reps - Standing Scapular Retraction  - 5 x daily - 7 x weekly - 1 sets - 5 reps - 5 second hold - Supine Scapular Protraction in Flexion with Dumbbells  - 2-3 x daily - 7 x weekly - 1 sets - 10-20 reps - 3 seconds hold  ASSESSMENT:  CLINICAL IMPRESSION: Lacey [name] reports feeling better with her right shoulder after she does her physical therapy exercises.  She still has noticeable guarding with active, active assisted and passive movement.  Although, she mentions this has improved since starting physical therapy.  I was able to progress an active assisted supine arm raise to work on scapular protraction for reaching function and some scapular retraction exercises to assist with right upper extremity use as well today.  Continue appropriate PROM, AAROM and  AROM progressions to meet short and long-term goals.   OBJECTIVE IMPAIRMENTS: decreased ROM, decreased strength, hypomobility, increased edema, increased fascial restrictions, increased muscle  spasms, impaired flexibility, impaired UE functional use, postural dysfunction, obesity, and pain.   ACTIVITY LIMITATIONS: carrying, lifting, bed mobility, bathing, toileting, dressing, reach over head, and caring for others  PARTICIPATION LIMITATIONS: cleaning, laundry, driving, shopping, community activity, and yard work  PERSONAL FACTORS: Age, Behavior pattern, Education, Fitness, Past/current experiences, Sex, Social background, and Time since onset of injury/illness/exacerbation are also affecting patient's functional outcome.   REHAB POTENTIAL: Good  CLINICAL DECISION MAKING: Stable/uncomplicated  EVALUATION COMPLEXITY: Low   GOALS: Goals reviewed with patient? Yes  SHORT TERM GOALS: Target date: 09/02/2022    Will be compliant with appropriate progressive HEP  Baseline: Goal status: On Going 08/07/2022  2.  R shoulder flexion and scaption PROM to be at least 100 degrees, R shoulder ER PROM to be at least 20 degrees at 0* ABD  Baseline:  Goal status: Partially met 08/07/2022  3.  R shoulder flexion and scaption AAROM to be at least 90 degrees, R shoulder ER AAROM to reach neutral  Baseline:  Goal status: On Going 08/07/2022  4.  Will demonstrate better functional posture/postural mechanics  Baseline:  Goal status: On Going 08/07/2022   LONG TERM GOALS: Target date: 09/30/2022    R shoulder flexion and scaption AROM to be at least 120 degrees  Baseline:  Goal status: INITIAL  2.  R shoulder ER AROM to be at least 30 degrees at 45* ABD  Baseline:  Goal status: INITIAL  3.  Will be able to reach L5 level with active FIR no increased pain  Baseline:  Goal status: INITIAL  4.  Will be able to perform all dressing/bathing tasks on a Mod(I) basis no increase in pain  Baseline:   Goal status: INITIAL  5.  Will be able to reach overhead to put dishes in the cabinet without increase in pain  Baseline:  Goal status: INITIAL   PLAN:  PT FREQUENCY: 3x/week  PT DURATION: 8 weeks  PLANNED INTERVENTIONS: Therapeutic exercises, Therapeutic activity, Neuromuscular re-education, Balance training, Gait training, Patient/Family education, Self Care, Joint mobilization, Aquatic Therapy, Dry Needling, Electrical stimulation, Cryotherapy, Moist heat, Taping, Ultrasound, Ionotophoresis 4mg /ml Dexamethasone, Manual therapy, and Re-evaluation  PLAN FOR NEXT SESSION: PROM, AAROM and appropriate AROM for flexion, IR and limited range ER.  Early phase scapular strength/functional work (without external resistance).  Progress per Dr. Serena Croissant protocol.  Cherlyn Cushing PT, MPT

## 2022-08-11 ENCOUNTER — Ambulatory Visit: Payer: Medicare HMO | Admitting: Physical Therapy

## 2022-08-11 ENCOUNTER — Encounter: Payer: Self-pay | Admitting: Physical Therapy

## 2022-08-11 DIAGNOSIS — M6281 Muscle weakness (generalized): Secondary | ICD-10-CM

## 2022-08-11 DIAGNOSIS — M25511 Pain in right shoulder: Secondary | ICD-10-CM

## 2022-08-11 DIAGNOSIS — R293 Abnormal posture: Secondary | ICD-10-CM | POA: Diagnosis not present

## 2022-08-11 DIAGNOSIS — M25611 Stiffness of right shoulder, not elsewhere classified: Secondary | ICD-10-CM

## 2022-08-11 NOTE — Therapy (Signed)
OUTPATIENT PHYSICAL THERAPY SHOULDER TREATMENT   Patient Name: Lacey [name] MRN: [id number] DOB:04-23-1951, 72 y.o., female Today's Date: 08/11/2022  END OF SESSION:  PT End of Session - 08/11/22 1150     Visit Number 4    Number of Visits 25    Date for PT Re-Evaluation 09/30/22    Authorization Type [employer] [facility]    Authorization Time Period 08/05/22 to 09/30/22    Progress Note Due on Visit 10    PT Start Time 1145    PT Stop Time 1227    PT Time Calculation (min) 42 min    Activity Tolerance Patient tolerated treatment well;No increased pain;Patient limited by pain    Behavior During Therapy [facility] for tasks assessed/performed              Past Medical History:  Diagnosis Date   Adenomatous colon polyp    hyperplastic   Anemia    Anginal pain    not recent   Arthritis    NECK, KNEES, FINGERS, TOES   Atrial tachycardia CARDIOLOGIST - DR Graciela Husbands (LAST VISIT AUG 2012)   Echo 12/11: EF 55-60%, Mild LVH, grade 1 diast dysfxn;   holter 1/12: ATach   Atypical chest pain    a. 07/2004 Cath: Clean cors;  b. 04/2010 Myoview: EF 63%, no ischemia   Blood transfusion without reported diagnosis 1969   CHF (congestive heart failure)    Chronic kidney disease    left kidney small    Coronary artery disease    Diabetes mellitus, type 2    ORAL AND INSULIN MEDS (LAST A1C  7.3)   Diverticulosis    Erythema CURRENT-- CLOSED ABD. WALL ABSCESS   PER PCP NOTE (03-31-11)- MRSA-- TAKES DOXYCYCLINE   GERD (gastroesophageal reflux disease)    CONTROLLED W/ PREVACID   History of kidney stones 2012   Hyperlipidemia    Hypertension    Insomnia    TAKES MEDS PRN   Neuropathy, peripheral    Obesity (BMI 30-39.9) 02/19/2015   Pneumonia    as child   Post op infection 11/07/2012   left bunionectomy   Restless leg syndrome    Sepsis, urinary HISTORY - 2004   Sleep apnea    no cpap    Thyroid disease    TIA (transient ischemic attack)    "mini strokes" 2023   Tremor    Vitamin D  deficiency    Past Surgical History:  Procedure Laterality Date   ABDOMINAL HYSTERECTOMY  1995   ovaries remain   BUNIONECTOMY Left 10/24/2012   BUNIONECTOMY Right 05/17/2017   CARDIAC CATHETERIZATION  07/10/2004   CARPAL TUNNEL RELEASE  2000   RIGHT   CATARACT EXTRACTION, BILATERAL     CERVICAL FUSION  10/12/2007   C5 - 7   COLONOSCOPY     CYSTOSCOPY WITH RETROGRADE PYELOGRAM, URETEROSCOPY AND STENT PLACEMENT Left 11/28/2012   Procedure: LEFT RETROGRADE PYELOGRAM, LEFT URETEROSCOPY, ;  Surgeon: Garnett Farm, MD;  Location: [facility];  Service: Urology;  Laterality: Left;   CYSTOSCOPY/RETROGRADE/URETEROSCOPY/STONE EXTRACTION WITH BASKET  X2 2004 & 2009   LEFT    GASTRIC BYPASS  1981   KNEE ARTHROSCOPY  12/2010   LEFT   LAPAROSCOPIC CHOLECYSTECTOMY  2001   LEFT HEART CATH AND CORONARY ANGIOGRAPHY N/A 12/03/2021   Procedure: LEFT HEART CATH AND CORONARY ANGIOGRAPHY;  Surgeon: Corky Crafts, MD;  Location: [facility] INVASIVE CV LAB;  Service: Cardiovascular;  Laterality: N/A;   left thumb joint surgery  2013  PERCUTANEOUS NEPHROSTOLITHOTOMY  02/27/2011   LEFT   POLYPECTOMY     REVERSE SHOULDER ARTHROPLASTY Right 07/12/2022   Procedure: REVERSE SHOULDER ARTHROPLASTY;  Surgeon: Huel Cote, MD;  Location: [facility] OR;  Service: Orthopedics;  Laterality: Right;   RIGHT THUMB JOINT SURG.  09/2009   TRIGGER FINGER RELEASE  2010   RIGHT THUMB   UPPER GASTROINTESTINAL ENDOSCOPY     URETERAL STENT PLACEMENT  X2  2004  &  2009   LEFT   URETEROSCOPY  04/06/2011   Procedure: URETEROSCOPY;  Surgeon: Garnett Farm, MD;  Location: [facility];  Service: Urology;  Laterality: Left;  L Ureteroscopy Laser Litho & Stent    Patient Active Problem List   Diagnosis Date Noted   Closed 3-part fracture of proximal end of right humerus 07/11/2022   Shoulder dislocation 07/11/2022   Abnormal stress test    Atypical chest pain 08/05/2021   Bradycardia 06/20/2021   Abnormal gait  03/17/2021   Acquired deformity of lower leg 03/17/2021   Urinary urgency 11/14/2020   Paresthesia 08/15/2020   Right lumbar pain 08/15/2020   Tremor 08/15/2020   Hypoglycemia associated with diabetes 02/29/2020   Essential (primary) hypertension 08/10/2019   Chronic neck pain 06/23/2016   Radicular low back pain 07/12/2015   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 without macular edema 04/24/2015   Severe obesity (BMI >= 40) 02/19/2015   OSA (obstructive sleep apnea) 10/23/2014   Excessive daytime sleepiness 10/23/2014   Hypothyroidism 09/07/2014   Tobacco abuse 01/15/2014   Recurrent UTI 09/06/2013   Orthostatic hypotension 10/05/2012   General medical examination 06/22/2011   Atrial tachycardia 11/25/2010   Pre-operative cardiovascular examination 11/25/2010   Chronic diarrhea 08/25/2010   KNEE PAIN 07/14/2010   Knee pain 07/14/2010   DIASTOLIC HEART FAILURE, CHRONIC 06/09/2010   PERSONAL HISTORY OF COLONIC POLYPS 05/01/2010   Vitamin D deficiency 01/20/2010   RESTLESS LEG SYNDROME 01/20/2010   Insomnia 01/20/2010   Restless leg syndrome 01/20/2010   Hyperlipidemia associated with type 2 diabetes mellitus 01/08/2009   Anemia 01/08/2009   GERD 01/08/2009   Benign hypertensive heart disease with heart failure 01/08/2009    PCP: Neena Rhymes MD   REFERRING PROVIDER: Huel Cote, MD  REFERRING DIAG: [phone] (ICD-10-CM) - Status post reverse arthroplasty of right shoulder  THERAPY DIAG:  Acute pain of right shoulder  Stiffness of right shoulder, not elsewhere classified  Muscle weakness (generalized)  Abnormal posture  Rationale for Evaluation and Treatment: Rehabilitation  ONSET DATE: Date of Surgery: 07/12/22  SUBJECTIVE:  SUBJECTIVE STATEMENT: Didn't sleep well last night.  Still having a lot of pain and occasional numbness into Rt arm; down to mid forearm  Hand dominance: Right  PERTINENT HISTORY: 72 year old female is 2 weeks status post right shoulder reverse shoulder arthroplasty overall doing very well. Because this is a reverse for fracture I will asked that she remain in the sling for an additional 2 weeks. She may come out of this for passive range of motion at this time 1 month postop. That time she will begin following the reverse for fracture protocol. Will plan on outpatient physical therapy.  PAIN:  Are you having pain? Yes: NPRS scale: 5/10 Pain location: front of my shoulder   Pain description: soreness  Aggravating factors: trying to bathe/get dressed, reaching into scratch Relieving factors: "I don't get comfortable, it always lets me know its there"   PRECAUTIONS: Other: reverse total shoulder for fracture protocol   WEIGHT BEARING RESTRICTIONS: Yes NWB surgical LE   FALLS:  Has patient fallen in last 6 months? Yes. Number of falls 2- one fall on ramp in the rain that led to fracture/surgery, other fall was the night she came home from the hospital and just passed out; FOF (+)  LIVING ENVIRONMENT: Lives with: lives with their spouse Lives in: House/apartment Stairs: No Has following equipment at home: Single point cane, Environmental consultant - 2 wheeled, Crutches, Wheelchair (manual), bed side commode, and Ramped entry + walk-in shower, shower bench   OCCUPATION: Retired   PLOF: Independent, Independent with basic ADLs, Independent with gait, and Independent with transfers  PATIENT GOALS: move my arm, be able to wash dishes, brush teeth R handed- generally improve level of function    OBJECTIVE:   PATIENT SURVEYS:  EVAL: FOTO 32, predicted 57  COGNITION: Overall cognitive status: Within functional limits for tasks assessed and very anxious about therapy      SENSATION: Reports  numbness and burning in upper arm since surgery   POSTURE: Rounded shoulders, forward head, increased thoracic kyphosis   UPPER EXTREMITY ROM:   Passive ROM Right eval Right 08/11/22  Shoulder flexion 45-50* P: 87  Shoulder extension    Shoulder scaption <20*   Shoulder abduction  P: 78  Shoulder internal rotation    Shoulder external rotation -10* to -15* (edited 08/06/22, noted that I put ER value in IR column KEU) P: 20  Elbow flexion About 75% limited PROM, very guarded    Elbow extension WNL PROM but very guarded    (Blank rows = not tested)  UPPER EXTREMITY MMT:  MMT Right eval Left eval  Shoulder flexion    Shoulder extension    Shoulder abduction    Shoulder adduction    Shoulder internal rotation    Shoulder external rotation    Middle trapezius    Lower trapezius    Elbow flexion    Elbow extension    Wrist flexion    Wrist extension    Wrist ulnar deviation    Wrist radial deviation    Wrist pronation    Wrist supination    Grip strength (lbs)    (Blank rows = not tested)- DNT at eval due to limitations in protocol    TODAY'S TREATMENT:  DATE:  08/11/22 TherEx Scapular retraction 2x10; 5 sec hold Rt shoulder flexion with LUE providing assist 2x10 Seated shoulder rolls backwards x 20 reps Seated forward table slides x 10 reps; LUE providing some assist  Manual Manual PROM right shoulder flexion, abduction, and ER to 30 degrees  08/07/2022 Standing pendulums: Limited range side-to-side; forward and back and circles clockwise and counterclockwise 30 times each Active assisted supine arm raises (scapular protraction with palm facing in and left side assisting) 2 sets of 10 for 3 seconds Shoulder blade pinches 2 sets of 10 for 5 seconds  Manual PROM right shoulder flexion, IR and ER to 30 degrees  Ice right shoulder 5  minutes post exercises   08/06/22 TherEx PROM/stretches within limits of protocol:  - flexion to 90*  - scaption to 90* - ER to 30* to 0*  ABD   Attempted flexion AAROM with light golf club, unable due to weakness  Attempted flexion AAROM with L UE holding right, unable to move arm more than 10-20 degrees    Eval   Objective measures/education    PATIENT EDUCATION: Education details: exam findings, POC, HEP; extensive education on typical recovery after shoulder surgery, also benefit of taking pain medicine prior to PT to make skilled interventions more tolerable  Person educated: Patient Education method: Explanation, Demonstration, and Handouts Education comprehension: verbalized understanding, returned demonstration, and needs further education  HOME EXERCISE PROGRAM: Access Code: [email or web address] URL: [email or web address] Date: 08/07/2022 Prepared by: Pauletta Browns  Exercises - Seated Shoulder Pendulum Exercise  - 3-5 x daily - 7 x weekly - 1 sets - 20 reps - Gentle Horizontal Shoulder Pendulum  - 3-5 x daily - 7 x weekly - 1 sets - 20 reps - Gentle Vertical Shoulder Pendulum  - 3-5 x daily - 7 x weekly - 1 sets - 20 reps - Standing Scapular Retraction  - 5 x daily - 7 x weekly - 1 sets - 5 reps - 5 second hold - Supine Scapular Protraction in Flexion with Dumbbells  - 2-3 x daily - 7 x weekly - 1 sets - 10-20 reps - 3 seconds hold  ASSESSMENT:  CLINICAL IMPRESSION: Pt tolerated session well today but continues to be limited by pain with all ROM activities.  She tolerated PROM pretty well today with updated PROM measurements noted.  Will continue to benefit from PT to maximize function.  OBJECTIVE IMPAIRMENTS: decreased ROM, decreased strength, hypomobility, increased edema, increased fascial restrictions, increased muscle spasms, impaired flexibility, impaired UE functional use, postural dysfunction, obesity, and pain.   ACTIVITY LIMITATIONS: carrying,  lifting, bed mobility, bathing, toileting, dressing, reach over head, and caring for others  PARTICIPATION LIMITATIONS: cleaning, laundry, driving, shopping, community activity, and yard work  PERSONAL FACTORS: Age, Behavior pattern, Education, Fitness, Past/current experiences, Sex, Social background, and Time since onset of injury/illness/exacerbation are also affecting patient's functional outcome.   REHAB POTENTIAL: Good  CLINICAL DECISION MAKING: Stable/uncomplicated  EVALUATION COMPLEXITY: Low   GOALS: Goals reviewed with patient? Yes  SHORT TERM GOALS: Target date: 09/02/2022    Will be compliant with appropriate progressive HEP  Baseline: Goal status: On Going 08/07/2022  2.  R shoulder flexion and scaption PROM to be at least 100 degrees, R shoulder ER PROM to be at least 20 degrees at 0* ABD  Baseline:  Goal status: Partially met 08/07/2022  3.  R shoulder flexion and scaption AAROM to be at least 90 degrees, R shoulder ER AAROM to reach neutral  Baseline:  Goal  status: On Going 08/07/2022  4.  Will demonstrate better functional posture/postural mechanics  Baseline:  Goal status: On Going 08/07/2022   LONG TERM GOALS: Target date: 09/30/2022    R shoulder flexion and scaption AROM to be at least 120 degrees  Baseline:  Goal status: INITIAL  2.  R shoulder ER AROM to be at least 30 degrees at 45* ABD  Baseline:  Goal status: INITIAL  3.  Will be able to reach L5 level with active FIR no increased pain  Baseline:  Goal status: INITIAL  4.  Will be able to perform all dressing/bathing tasks on a Mod(I) basis no increase in pain  Baseline:  Goal status: INITIAL  5.  Will be able to reach overhead to put dishes in the cabinet without increase in pain  Baseline:  Goal status: INITIAL   PLAN:  PT FREQUENCY: 3x/week  PT DURATION: 8 weeks  PLANNED INTERVENTIONS: Therapeutic exercises, Therapeutic activity, Neuromuscular re-education, Balance training, Gait  training, Patient/Family education, Self Care, Joint mobilization, Aquatic Therapy, Dry Needling, Electrical stimulation, Cryotherapy, Moist heat, Taping, Ultrasound, Ionotophoresis 4mg /ml Dexamethasone, Manual therapy, and Re-evaluation  PLAN FOR NEXT SESSION:  PROM, AAROM and appropriate AROM for flexion, IR and limited range ER.  Early phase scapular strength/functional work (without external resistance).  Progress per Dr. Serena CroissantBokshan's protocol.  NEXT MD VISIT: 08/12/22  Clarita CraneStephanie F Leoma Folds, PT, DPT 08/11/22 12:34 PM

## 2022-08-12 ENCOUNTER — Ambulatory Visit (INDEPENDENT_AMBULATORY_CARE_PROVIDER_SITE_OTHER): Payer: Medicare HMO

## 2022-08-12 ENCOUNTER — Ambulatory Visit (INDEPENDENT_AMBULATORY_CARE_PROVIDER_SITE_OTHER): Payer: Medicare HMO | Admitting: Orthopaedic Surgery

## 2022-08-12 DIAGNOSIS — Z96611 Presence of right artificial shoulder joint: Secondary | ICD-10-CM

## 2022-08-12 DIAGNOSIS — M25531 Pain in right wrist: Secondary | ICD-10-CM | POA: Diagnosis not present

## 2022-08-12 DIAGNOSIS — Z471 Aftercare following joint replacement surgery: Secondary | ICD-10-CM | POA: Diagnosis not present

## 2022-08-12 NOTE — Progress Notes (Signed)
Post Operative Evaluation    Procedure/Date of Surgery: Right shoulder reverse shoulder arthroplasty July 12, 2022  Interval History:    Presents today for follow-up of her right shoulder.  Overall she has been working on range of motion which is improving slowly.  She does endorse some paresthesias down the musculocutaneous distribution of the right arm.  This is slowly improving.  She has been working with physical therapy.  PMH/PSH/Family History/Social History/Meds/Allergies:    Past Medical History:  Diagnosis Date   Adenomatous colon polyp    hyperplastic   Anemia    Anginal pain    not recent   Arthritis    NECK, KNEES, FINGERS, TOES   Atrial tachycardia CARDIOLOGIST - DR Graciela Husbands (LAST VISIT AUG 2012)   Echo 12/11: EF 55-60%, Mild LVH, grade 1 diast dysfxn;   holter 1/12: ATach   Atypical chest pain    a. 07/2004 Cath: Clean cors;  b. 04/2010 Myoview: EF 63%, no ischemia   Blood transfusion without reported diagnosis 1969   CHF (congestive heart failure)    Chronic kidney disease    left kidney small    Coronary artery disease    Diabetes mellitus, type 2    ORAL AND INSULIN MEDS (LAST A1C  7.3)   Diverticulosis    Erythema CURRENT-- CLOSED ABD. WALL ABSCESS   PER PCP NOTE (03-31-11)- MRSA-- TAKES DOXYCYCLINE   GERD (gastroesophageal reflux disease)    CONTROLLED W/ PREVACID   History of kidney stones 2012   Hyperlipidemia    Hypertension    Insomnia    TAKES MEDS PRN   Neuropathy, peripheral    Obesity (BMI 30-39.9) 02/19/2015   Pneumonia    as child   Post op infection 11/07/2012   left bunionectomy   Restless leg syndrome    Sepsis, urinary HISTORY - 2004   Sleep apnea    no cpap    Thyroid disease    TIA (transient ischemic attack)    "mini strokes" 2023   Tremor    Vitamin D deficiency    Past Surgical History:  Procedure Laterality Date   ABDOMINAL HYSTERECTOMY  1995   ovaries remain   BUNIONECTOMY Left  10/24/2012   BUNIONECTOMY Right 05/17/2017   CARDIAC CATHETERIZATION  07/10/2004   CARPAL TUNNEL RELEASE  2000   RIGHT   CATARACT EXTRACTION, BILATERAL     CERVICAL FUSION  10/12/2007   C5 - 7   COLONOSCOPY     CYSTOSCOPY WITH RETROGRADE PYELOGRAM, URETEROSCOPY AND STENT PLACEMENT Left 11/28/2012   Procedure: LEFT RETROGRADE PYELOGRAM, LEFT URETEROSCOPY, ;  Surgeon: Garnett Farm, MD;  Location: [facility];  Service: Urology;  Laterality: Left;   CYSTOSCOPY/RETROGRADE/URETEROSCOPY/STONE EXTRACTION WITH BASKET  X2 2004 & 2009   LEFT    GASTRIC BYPASS  1981   KNEE ARTHROSCOPY  12/2010   LEFT   LAPAROSCOPIC CHOLECYSTECTOMY  2001   LEFT HEART CATH AND CORONARY ANGIOGRAPHY N/A 12/03/2021   Procedure: LEFT HEART CATH AND CORONARY ANGIOGRAPHY;  Surgeon: Corky Crafts, MD;  Location: [facility];  Service: Cardiovascular;  Laterality: N/A;   left thumb joint surgery  2013   PERCUTANEOUS NEPHROSTOLITHOTOMY  02/27/2011   LEFT   POLYPECTOMY     REVERSE SHOULDER ARTHROPLASTY Right 07/12/2022   Procedure: REVERSE SHOULDER ARTHROPLASTY;  Surgeon:  Huel CoteBokshan, Sharlotte Baka, MD;  [facility];  Service: Orthopedics;  Laterality: Right;   RIGHT THUMB JOINT SURG.  09/2009   TRIGGER FINGER RELEASE  2010   RIGHT THUMB   UPPER GASTROINTESTINAL ENDOSCOPY     URETERAL STENT PLACEMENT  X2  2004  &  2009   LEFT   URETEROSCOPY  04/06/2011   Procedure: URETEROSCOPY;  Surgeon: Garnett FarmMark C Ottelin, MD;  [facility];  Service: Urology;  Laterality: Left;  L Ureteroscopy Laser Litho & Stent    Social History   Socioeconomic History   Marital status: Married    Spouse name: Not on file   Number of children: 2   Years of education: 12   Highest education level: High school graduate  Occupational History   Occupation: [employer]    Employer: [employer]    Comment: in office   Occupation: Retired  Tobacco Use   Smoking status: Every Day    Packs/day: 2.00     Years: 43.00    Additional pack years: 0.00    Total pack years: 86.00    Types: Cigarettes   Smokeless tobacco: Never   Tobacco comments:    1/2 ppd 12/29/21  Vaping Use   Vaping Use: Never used  Substance and Sexual Activity   Alcohol use: No    Alcohol/week: 0.0 standard drinks of alcohol   Drug use: No   Sexual activity: Not on file  Other Topics Concern   Not on file  Social History Narrative   2 children, 2 stepchildren   Lives with husband.   Right-handed.   No daily caffeine.   Social Determinants of Health   Financial Resource Strain: Low Risk  (01/08/2022)   Overall Financial Resource Strain (CARDIA)    Difficulty of Paying Living Expenses: Not hard at all  Food Insecurity: No Food Insecurity (01/08/2022)   Hunger Vital Sign    Worried About Running Out of Food in the Last Year: Never true    Ran Out of Food in the Last Year: Never true  Transportation Needs: No Transportation Needs (01/08/2022)   PRAPARE - Administrator, Civil ServiceTransportation    Lack of Transportation (Medical): No    Lack of Transportation (Non-Medical): No  Physical Activity: Insufficiently Active (01/08/2022)   Exercise Vital Sign    Days of Exercise per Week: 3 days    Minutes of Exercise per Session: 30 min  Stress: No Stress Concern Present (01/08/2022)   [employer] - Occupational Stress Questionnaire    Feeling of Stress : Not at all  Social Connections: Moderately Integrated (01/08/2022)   Social Connection and Isolation Panel [NHANES]    Frequency of Communication with Friends and Family: Three times a week    Frequency of Social Gatherings with Friends and Family: Once a week    Attends Religious Services: More than 4 times per year    Active Member of [employer]ubs or Organizations: No    Attends Engineer, structuralClub or Organization Meetings: Never    Marital Status: Married   Family History  Problem Relation Age of Onset   Hyperlipidemia Mother    Hypertension Mother    Heart attack Father    Lung  disease Father    Heart disease Sister    Hypertension Sister    Colon polyps Sister    Hypertension Sister    Colon polyps Sister    Hypertension Sister    Hypertension Sister    Lung cancer Sister [id number]  stage 4    Diabetes Other    Breast cancer Other    Heart disease Other    Colon cancer Other 25       nephew   Esophageal cancer Neg Hx    Rectal cancer Neg Hx    Stomach cancer Neg Hx    Amblyopia Neg Hx    Blindness Neg Hx    Cataracts Neg Hx    Glaucoma Neg Hx    Retinal detachment Neg Hx    Strabismus Neg Hx    Retinitis pigmentosa Neg Hx    No Known Allergies Current Outpatient Medications  Medication Sig Dispense Refill   acetaminophen (TYLENOL) 500 MG tablet Take 2 tablets (1,000 mg total) by mouth 3 (three) times daily. 30 tablet 0   albuterol (VENTOLIN HFA) 108 (90 Base) MCG/ACT inhaler TAKE 2 PUFFS BY MOUTH EVERY 6 HOURS AS NEEDED FOR WHEEZE OR SHORTNESS OF BREATH (Patient taking differently: Inhale 2 puffs into the lungs every 6 (six) hours as needed for wheezing or shortness of breath.) 18 each 6   Alcohol Swabs (DROPSAFE ALCOHOL PREP) 70 % PADS USE AS DIRECTED  AS NEEDED. 200 each 3   aspirin 325 MG tablet Take 1 tablet (325 mg total) by mouth daily for 28 days. 28 tablet 0   [START ON 08/13/2022] aspirin EC 81 MG tablet Take 1 tablet (81 mg total) by mouth daily. 30 tablet 11   atorvastatin (LIPITOR) 20 MG tablet TAKE 1 TABLET BY MOUTH EVERY DAY (Patient taking differently: Take 20 mg by mouth daily.) 90 tablet 1   Blood Glucose Monitoring Suppl (TRUE METRIX METER) w/Device KIT USE AS DIRECTED 1 kit 1   calcium-vitamin D (OSCAL WITH D) 500-5 MG-MCG tablet Take 1 tablet by mouth.     cephALEXin (KEFLEX) 500 MG capsule Take 500 mg by mouth daily at 12 noon.     cholecalciferol (VITAMIN D) 25 MCG (1000 UNIT) tablet Take 1,000 Units by mouth daily.     Cholecalciferol (VITAMIN D-3) 25 MCG (1000 UT) CAPS Take 1,000 Units by mouth daily. (Patient not taking:  Reported on 07/22/2022)     clonazePAM (KLONOPIN) 1 MG tablet TAKE 1 TABLET AT BEDTIME FOR RESTLESS LEGS SYNDROME (Patient taking differently: Take 0.5-1 mg by mouth at bedtime as needed (for restless legs).) 30 tablet 3   Continuous Blood Gluc Receiver (DEXCOM G6 RECEIVER) DEVI Use as directed w/ G6 sensor 1 each 0   Continuous Blood Gluc Sensor (DEXCOM G6 SENSOR) MISC Apply sensor every 10 days for continuous blood sugar readings 3 each 3   Continuous Blood Gluc Transmit (DEXCOM G6 TRANSMITTER) MISC Use as directed w/ G6 sensor 1 each 0   diclofenac Sodium (VOLTAREN) 1 % GEL APPLY 2 GRAMS TO AFFECTED AREA 4 TIMES A DAY (Patient taking differently: Apply 2 g topically 4 (four) times daily as needed (for pain- hands and knees).) 300 g 1   fenofibrate 160 MG tablet Take 1 tablet (160 mg total) by mouth daily. 90 tablet 10   furosemide (LASIX) 20 MG tablet TAKE 1 TABLET EVERY DAY 90 tablet 1   gabapentin (NEURONTIN) 300 MG capsule Take 3 capsules (900 mg total) by mouth 3 (three) times daily. 270 capsule 11   glucose blood (TRUE METRIX BLOOD GLUCOSE TEST) test strip Use as instructed to test blood sugar 4 times daily. Dx. E11.9 400 each 3   insulin aspart (NOVOLOG FLEXPEN) 100 UNIT/ML FlexPen INJECT 25 UNITS SUBCUTANEOUSLY THREE TIMES DAILY WITH MEALS (Patient  taking differently: Inject 25 Units into the skin 3 (three) times daily with meals.) 75 mL 0   insulin glargine (LANTUS SOLOSTAR) 100 UNIT/ML Solostar Pen INJECT 60 UNITS UNDER THE SKIN AT BEDTIME (Patient taking differently: Inject 60 Units into the skin at bedtime.) 60 mL 10   Insulin Pen Needle (DROPLET PEN NEEDLES) 31G X 8 MM MISC USE 4 TIMES DAILY 400 each 6   Lancets Super Thin 28G MISC Please use as directed to test sugars 4 times daily. Dx. E11.9 420 each 3   lansoprazole (PREVACID) 15 MG capsule Take 15 mg by mouth daily before breakfast.     levothyroxine (SYNTHROID) 50 MCG tablet TAKE 1 TABLET BY MOUTH EVERY DAY BEFORE BREAKFAST (Patient  taking differently: Take 50 mcg by mouth daily before breakfast.) 90 tablet 1   loperamide (IMODIUM A-D) 2 MG tablet Take 2 mg by mouth 4 (four) times daily as needed for diarrhea or loose stools.     Oxycodone HCl 10 MG TABS Take 1 tablet (10 mg total) by mouth every 6 (six) hours as needed. 28 tablet 0   primidone (MYSOLINE) 50 MG tablet TAKE  2   TABLETS IN THE AM  AND TAKE 1 IN THE PM  BY MOUTH TWICE A DAY (Patient taking differently: Take 50-100 mg by mouth See admin instructions. Take 100 mg by mouth in the morning and 50 mg in the evening) 270 tablet 0   sertraline (ZOLOFT) 25 MG tablet Take 1 tablet (25 mg total) by mouth daily. 30 tablet 3   traZODone (DESYREL) 100 MG tablet Take 1 tablet (100 mg total) by mouth at bedtime. 90 tablet 3   valsartan (DIOVAN) 320 MG tablet Take 1 tablet (320 mg total) by mouth daily. 90 tablet 2   verapamil (CALAN-SR) 120 MG CR tablet TAKE 1 TABLET BY MOUTH EVERYDAY AT BEDTIME (Patient taking differently: Take 120 mg by mouth at bedtime.) 90 tablet 1   Vibegron (GEMTESA) 75 MG TABS Take 1 tablet by mouth daily.     No current facility-administered medications for this visit.   No results found.  Review of Systems:   A ROS was performed including pertinent positives and negatives as documented in the HPI.   Musculoskeletal Exam:     Right shoulder incision is healed.  Stable to flex and extend at the right elbow.  Passive motion is to 70 degrees in the supine position she fires all 3 heads of the deltoid.  Sensation is intact in axillary distribution.  Fires EPL as well as wrist extensors.  2+ radial pulse  Imaging:    AP right shoulder: Status post reverse shoulder arthroplasty without evidence of complication  I personally reviewed and interpreted the radiographs.   Assessment:   72 year old female is 5 weeks status post right shoulder reverse shoulder arthroplasty fracture overall continuing to improve.  At this time she may begin passive  range of motion as well gentle active assisted range of motion.  I would like her to work on this aggressively.  I will plan to see her back in 4 weeks for reassessment  Plan :    -Return to clinic 4 weeks for reassessment      I personally saw and evaluated the patient, and participated in the management and treatment plan.  Huel Cote, MD Attending Physician, Orthopedic Surgery  This document was dictated using Dragon voice recognition software. A reasonable attempt at proof reading has been made to minimize errors.

## 2022-08-13 ENCOUNTER — Encounter: Payer: Self-pay | Admitting: Rehabilitative and Restorative Service Providers"

## 2022-08-13 ENCOUNTER — Other Ambulatory Visit: Payer: Self-pay | Admitting: Family Medicine

## 2022-08-13 ENCOUNTER — Ambulatory Visit (INDEPENDENT_AMBULATORY_CARE_PROVIDER_SITE_OTHER): Payer: Medicare HMO | Admitting: Rehabilitative and Restorative Service Providers"

## 2022-08-13 DIAGNOSIS — M25611 Stiffness of right shoulder, not elsewhere classified: Secondary | ICD-10-CM

## 2022-08-13 DIAGNOSIS — R293 Abnormal posture: Secondary | ICD-10-CM

## 2022-08-13 DIAGNOSIS — M25511 Pain in right shoulder: Secondary | ICD-10-CM

## 2022-08-13 DIAGNOSIS — M6281 Muscle weakness (generalized): Secondary | ICD-10-CM | POA: Diagnosis not present

## 2022-08-13 NOTE — Therapy (Signed)
OUTPATIENT PHYSICAL THERAPY SHOULDER TREATMENT   Patient Name: Lacey [name] MRN: [id number] DOB:08-14-1950, 72 y.o., female Today's Date: 08/13/2022  END OF SESSION:  PT End of Session - 08/13/22 1300     Visit Number 5    Number of Visits 25    Date for PT Re-Evaluation 09/30/22    Authorization Type [employer] [facility]    Authorization Time Period 08/05/22 to 09/30/22    Authorization - Visit Number 5    Progress Note Due on Visit 10    PT Start Time 1101    PT Stop Time 1144    PT Time Calculation (min) 43 min    Activity Tolerance Patient tolerated treatment well;No increased pain;Patient limited by pain    Behavior During Therapy [facility] for tasks assessed/performed               Past Medical History:  Diagnosis Date   Adenomatous colon polyp    hyperplastic   Anemia    Anginal pain    not recent   Arthritis    NECK, KNEES, FINGERS, TOES   Atrial tachycardia CARDIOLOGIST - DR Graciela Husbands (LAST VISIT AUG 2012)   Echo 12/11: EF 55-60%, Mild LVH, grade 1 diast dysfxn;   holter 1/12: ATach   Atypical chest pain    a. 07/2004 Cath: Clean cors;  b. 04/2010 Myoview: EF 63%, no ischemia   Blood transfusion without reported diagnosis 1969   CHF (congestive heart failure)    Chronic kidney disease    left kidney small    Coronary artery disease    Diabetes mellitus, type 2    ORAL AND INSULIN MEDS (LAST A1C  7.3)   Diverticulosis    Erythema CURRENT-- CLOSED ABD. WALL ABSCESS   PER PCP NOTE (03-31-11)- MRSA-- TAKES DOXYCYCLINE   GERD (gastroesophageal reflux disease)    CONTROLLED W/ PREVACID   History of kidney stones 2012   Hyperlipidemia    Hypertension    Insomnia    TAKES MEDS PRN   Neuropathy, peripheral    Obesity (BMI 30-39.9) 02/19/2015   Pneumonia    as child   Post op infection 11/07/2012   left bunionectomy   Restless leg syndrome    Sepsis, urinary HISTORY - 2004   Sleep apnea    no cpap    Thyroid disease    TIA (transient ischemic attack)    "mini strokes"  2023   Tremor    Vitamin D deficiency    Past Surgical History:  Procedure Laterality Date   ABDOMINAL HYSTERECTOMY  1995   ovaries remain   BUNIONECTOMY Left 10/24/2012   BUNIONECTOMY Right 05/17/2017   CARDIAC CATHETERIZATION  07/10/2004   CARPAL TUNNEL RELEASE  2000   RIGHT   CATARACT EXTRACTION, BILATERAL     CERVICAL FUSION  10/12/2007   C5 - 7   COLONOSCOPY     CYSTOSCOPY WITH RETROGRADE PYELOGRAM, URETEROSCOPY AND STENT PLACEMENT Left 11/28/2012   Procedure: LEFT RETROGRADE PYELOGRAM, LEFT URETEROSCOPY, ;  Surgeon: Garnett Farm, MD;  Location: [facility];  Service: Urology;  Laterality: Left;   CYSTOSCOPY/RETROGRADE/URETEROSCOPY/STONE EXTRACTION WITH BASKET  X2 2004 & 2009   LEFT    GASTRIC BYPASS  1981   KNEE ARTHROSCOPY  12/2010   LEFT   LAPAROSCOPIC CHOLECYSTECTOMY  2001   LEFT HEART CATH AND CORONARY ANGIOGRAPHY N/A 12/03/2021   Procedure: LEFT HEART CATH AND CORONARY ANGIOGRAPHY;  Surgeon: Corky Crafts, MD;  Location: [facility] INVASIVE CV LAB;  Service: Cardiovascular;  Laterality:  N/A;   left thumb joint surgery  2013   PERCUTANEOUS NEPHROSTOLITHOTOMY  02/27/2011   LEFT   POLYPECTOMY     REVERSE SHOULDER ARTHROPLASTY Right 07/12/2022   Procedure: REVERSE SHOULDER ARTHROPLASTY;  Surgeon: Huel CoteBokshan, Steven, MD;  Location: [facility] OR;  Service: Orthopedics;  Laterality: Right;   RIGHT THUMB JOINT SURG.  09/2009   TRIGGER FINGER RELEASE  2010   RIGHT THUMB   UPPER GASTROINTESTINAL ENDOSCOPY     URETERAL STENT PLACEMENT  X2  2004  &  2009   LEFT   URETEROSCOPY  04/06/2011   Procedure: URETEROSCOPY;  Surgeon: Garnett FarmMark C Ottelin, MD;  [facility];  Service: Urology;  Laterality: Left;  L Ureteroscopy Laser Litho & Stent    Patient Active Problem List   Diagnosis Date Noted   Closed 3-part fracture of proximal end of right humerus 07/11/2022   Shoulder dislocation 07/11/2022   Abnormal stress test    Atypical chest pain 08/05/2021   Bradycardia  06/20/2021   Abnormal gait 03/17/2021   Acquired deformity of lower leg 03/17/2021   Urinary urgency 11/14/2020   Paresthesia 08/15/2020   Right lumbar pain 08/15/2020   Tremor 08/15/2020   Hypoglycemia associated with diabetes 02/29/2020   Essential (primary) hypertension 08/10/2019   Chronic neck pain 06/23/2016   Radicular low back pain 07/12/2015   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 without macular edema 04/24/2015   Severe obesity (BMI >= 40) 02/19/2015   OSA (obstructive sleep apnea) 10/23/2014   Excessive daytime sleepiness 10/23/2014   Hypothyroidism 09/07/2014   Tobacco abuse 01/15/2014   Recurrent UTI 09/06/2013   Orthostatic hypotension 10/05/2012   General medical examination 06/22/2011   Atrial tachycardia 11/25/2010   Pre-operative cardiovascular examination 11/25/2010   Chronic diarrhea 08/25/2010   KNEE PAIN 07/14/2010   Knee pain 07/14/2010   DIASTOLIC HEART FAILURE, CHRONIC 06/09/2010   PERSONAL HISTORY OF COLONIC POLYPS 05/01/2010   Vitamin D deficiency 01/20/2010   RESTLESS LEG SYNDROME 01/20/2010   Insomnia 01/20/2010   Restless leg syndrome 01/20/2010   Hyperlipidemia associated with type 2 diabetes mellitus 01/08/2009   Anemia 01/08/2009   GERD 01/08/2009   Benign hypertensive heart disease with heart failure 01/08/2009    PCP: Neena Rhymesabori, Katherine MD   REFERRING PROVIDER: Huel CoteBokshan, Steven, MD  REFERRING DIAG: [phone] (ICD-10-CM) - Status post reverse arthroplasty of right shoulder  THERAPY DIAG:  Acute pain of right shoulder  Stiffness of right shoulder, not elsewhere classified  Muscle weakness (generalized)  Abnormal posture  Rationale for Evaluation and Treatment: Rehabilitation  ONSET DATE: Date of Surgery: 07/12/22  SUBJECTIVE:  SUBJECTIVE STATEMENT: Lacey [name] is getting 2-3 hours of sleep uninterrupted.  She reports good HEP compliance.  Her follow-up with Dr. Steward Drone yesterday went well and she is OK to progress per his protocol.  Hand dominance: Right  PERTINENT HISTORY: 72 year old female is 2 weeks status post right shoulder reverse shoulder arthroplasty overall doing very well. Because this is a reverse for fracture I will asked that she remain in the sling for an additional 2 weeks. She may come out of this for passive range of motion at this time 1 month postop. That time she will begin following the reverse for fracture protocol. Will plan on outpatient physical therapy.  PAIN:  Are you having pain? Yes: NPRS scale: 3-8/10 this week Pain location: Front of my shoulder   Pain description: soreness  Aggravating factors: trying to bathe/get dressed, reaching into scratch Relieving factors: "I don't get comfortable, it always lets me know its there"   PRECAUTIONS: Other: reverse total shoulder for fracture protocol   WEIGHT BEARING RESTRICTIONS: Yes NWB surgical LE   FALLS:  Has patient fallen in last 6 months? Yes. Number of falls 2- one fall on ramp in the rain that led to fracture/surgery, other fall was the night she came home from the hospital and just passed out; FOF (+)  LIVING ENVIRONMENT: Lives with: lives with their spouse Lives in: House/apartment Stairs: No Has following equipment at home: Single point cane, Environmental consultant - 2 wheeled, Crutches, Wheelchair (manual), bed side commode, and Ramped entry + walk-in shower, shower bench   OCCUPATION: Retired   PLOF: Independent, Independent with basic ADLs, Independent with gait, and Independent with transfers  PATIENT GOALS: move my arm, be able to wash dishes, brush teeth R handed- generally improve level of function    OBJECTIVE:   PATIENT SURVEYS:  EVAL: FOTO 32, predicted 57  COGNITION: Overall cognitive status: Within  functional limits for tasks assessed and very anxious about therapy      SENSATION: Reports numbness and burning in upper arm since surgery   POSTURE: Rounded shoulders, forward head, increased thoracic kyphosis   UPPER EXTREMITY ROM:   Passive ROM Right eval Right 08/11/22 Right 08/13/2022  Shoulder flexion 45-50* P: 87 100  Shoulder extension     Shoulder scaption <20*    Shoulder abduction  P: 78   Shoulder internal rotation   60  Shoulder external rotation -10* to -15* (edited 08/06/22, noted that I put ER value in IR column KEU) P: 20 30  Elbow flexion About 75% limited PROM, very guarded     Elbow extension WNL PROM but very guarded     (Blank rows = not tested)  UPPER EXTREMITY MMT:  MMT Right eval Left eval  Shoulder flexion    Shoulder extension    Shoulder abduction    Shoulder adduction    Shoulder internal rotation    Shoulder external rotation    Middle trapezius    Lower trapezius    Elbow flexion    Elbow extension    Wrist flexion    Wrist extension    Wrist ulnar deviation    Wrist radial deviation    Wrist pronation    Wrist supination    Grip strength (lbs)    (Blank rows = not tested)- DNT at eval due to limitations in protocol    TODAY'S TREATMENT:  DATE:  08/13/2022 -Standing pendulums within comfortable range: side-to-side; forward and back and circles clockwise and counterclockwise 30 times each -Active assisted supine arm raises (scapular protraction with palm facing in and left side assisting) 2 sets of 10 for 3 seconds -Shoulder blade pinches 2 sets of 10 for 5 seconds  Manual PROM right shoulder flexion, IR and ER to 30 degrees  Ice right shoulder 5 minutes post exercises   08/11/22 TherEx Scapular retraction 2x10; 5 sec hold Rt shoulder flexion with LUE providing assist 2x10 Seated shoulder rolls  backwards x 20 reps Seated forward table slides x 10 reps; LUE providing some assist  Manual Manual PROM right shoulder flexion, abduction, and ER to 30 degrees   08/07/2022 Standing pendulums: Limited range side-to-side; forward and back and circles clockwise and counterclockwise 30 times each Active assisted supine arm raises (scapular protraction with palm facing in and left side assisting) 2 sets of 10 for 3 seconds Shoulder blade pinches 2 sets of 10 for 5 seconds  Manual PROM right shoulder flexion, IR and ER to 30 degrees  Ice right shoulder 5 minutes post exercises   PATIENT EDUCATION: Education details: exam findings, POC, HEP; extensive education on typical recovery after shoulder surgery, also benefit of taking pain medicine prior to PT to make skilled interventions more tolerable  Person educated: Patient Education method: Explanation, Demonstration, and Handouts Education comprehension: verbalized understanding, returned demonstration, and needs further education  HOME EXERCISE PROGRAM: Access Code: [email or web address] URL: [email or web address] Date: 08/07/2022 Prepared by: Pauletta Browns  Exercises - Seated Shoulder Pendulum Exercise  - 3-5 x daily - 7 x weekly - 1 sets - 20 reps - Gentle Horizontal Shoulder Pendulum  - 3-5 x daily - 7 x weekly - 1 sets - 20 reps - Gentle Vertical Shoulder Pendulum  - 3-5 x daily - 7 x weekly - 1 sets - 20 reps - Standing Scapular Retraction  - 5 x daily - 7 x weekly - 1 sets - 5 reps - 5 second hold - Supine Scapular Protraction in Flexion with Dumbbells  - 2-3 x daily - 7 x weekly - 1 sets - 10-20 reps - 3 seconds hold  ASSESSMENT:  CLINICAL IMPRESSION: Thelma was able to reach 100 degrees of shoulder flexion passively while supine today.  Overall, her ROM is appropriate for 4 weeks post-reverse TSA.  ER will be most slowly progressed to protect the healing subscapularis while flexion and IR will be progressed per protocol along  with scapular and general left upper extremity strength.  Continue PROM and AAROM with progressions to AROM and strength as appropriate.  OBJECTIVE IMPAIRMENTS: decreased ROM, decreased strength, hypomobility, increased edema, increased fascial restrictions, increased muscle spasms, impaired flexibility, impaired UE functional use, postural dysfunction, obesity, and pain.   ACTIVITY LIMITATIONS: carrying, lifting, bed mobility, bathing, toileting, dressing, reach over head, and caring for others  PARTICIPATION LIMITATIONS: cleaning, laundry, driving, shopping, community activity, and yard work  PERSONAL FACTORS: Age, Behavior pattern, Education, Fitness, Past/current experiences, Sex, Social background, and Time since onset of injury/illness/exacerbation are also affecting patient's functional outcome.   REHAB POTENTIAL: Good  CLINICAL DECISION MAKING: Stable/uncomplicated  EVALUATION COMPLEXITY: Low   GOALS: Goals reviewed with patient? Yes  SHORT TERM GOALS: Target date: 09/02/2022    Will be compliant with appropriate progressive HEP  Baseline: Goal status: On Going 08/13/2022  2.  R shoulder flexion and scaption PROM to be at least 100 degrees, R shoulder ER PROM to be at least 20 degrees  at 0* ABD  Baseline:  Goal status: Met 08/13/2022  3.  R shoulder flexion and scaption AAROM to be at least 90 degrees, R shoulder ER AAROM to reach neutral  Baseline:  Goal status: On Going 08/13/2022  4.  Will demonstrate better functional posture/postural mechanics  Baseline:  Goal status: On Going 08/13/2022   LONG TERM GOALS: Target date: 09/30/2022    R shoulder flexion and scaption AROM to be at least 120 degrees  Baseline:  Goal status: On Going 08/13/2022  2.  R shoulder ER AROM to be at least 30 degrees at 45* ABD  Baseline:  Goal status: On Going 08/13/2022  3.  Will be able to reach L5 level with active FIR no increased pain  Baseline:  Goal status: INITIAL  4.  Will be  able to perform all dressing/bathing tasks on a Mod(I) basis no increase in pain  Baseline:  Goal status: INITIAL  5.  Will be able to reach overhead to put dishes in the cabinet without increase in pain  Baseline:  Goal status: INITIAL   PLAN:  PT FREQUENCY: 3x/week  PT DURATION: 8 weeks  PLANNED INTERVENTIONS: Therapeutic exercises, Therapeutic activity, Neuromuscular re-education, Balance training, Gait training, Patient/Family education, Self Care, Joint mobilization, Aquatic Therapy, Dry Needling, Electrical stimulation, Cryotherapy, Moist heat, Taping, Ultrasound, Ionotophoresis 4mg /ml Dexamethasone, Manual therapy, and Re-evaluation  PLAN FOR NEXT SESSION:  PROM, AAROM and appropriate AROM for flexion, IR and limited range ER.  Early phase scapular strength/functional work (without external resistance).  Progress per Dr. Serena Croissant protocol.  Consider pulley AAROM flexion with scapular protraction next week.  NEXT MD VISIT: 09/11/22  Cherlyn Cushing PT, MPT 08/13/22 1:07 PM

## 2022-08-14 ENCOUNTER — Ambulatory Visit: Payer: Medicare HMO | Admitting: Physical Therapy

## 2022-08-14 ENCOUNTER — Encounter: Payer: Self-pay | Admitting: Physical Therapy

## 2022-08-14 DIAGNOSIS — R293 Abnormal posture: Secondary | ICD-10-CM

## 2022-08-14 DIAGNOSIS — M25611 Stiffness of right shoulder, not elsewhere classified: Secondary | ICD-10-CM | POA: Diagnosis not present

## 2022-08-14 DIAGNOSIS — M25511 Pain in right shoulder: Secondary | ICD-10-CM

## 2022-08-14 DIAGNOSIS — M6281 Muscle weakness (generalized): Secondary | ICD-10-CM | POA: Diagnosis not present

## 2022-08-14 NOTE — Therapy (Signed)
OUTPATIENT PHYSICAL THERAPY SHOULDER TREATMENT   Patient Name: Lacey [name] MRN: [id number] DOB:1950/10/26, 72 y.o., female Today's Date: 08/14/2022  END OF SESSION:  PT End of Session - 08/14/22 0956     Visit Number 6    Number of Visits 25    Date for PT Re-Evaluation 09/30/22    Authorization Type [employer] MCR    Authorization Time Period 08/05/22 to 09/30/22    Authorization - Visit Number 6    Progress Note Due on Visit 10    PT Start Time 0932    PT Stop Time 1010    PT Time Calculation (min) 38 min    Activity Tolerance Patient tolerated treatment well;No increased pain    Behavior During Therapy WFL for tasks assessed/performed                Past Medical History:  Diagnosis Date   Adenomatous colon polyp    hyperplastic   Anemia    Anginal pain    not recent   Arthritis    NECK, KNEES, FINGERS, TOES   Atrial tachycardia CARDIOLOGIST - DR Graciela Husbands (LAST VISIT AUG 2012)   Echo 12/11: EF 55-60%, Mild LVH, grade 1 diast dysfxn;   holter 1/12: ATach   Atypical chest pain    a. 07/2004 Cath: Clean cors;  b. 04/2010 Myoview: EF 63%, no ischemia   Blood transfusion without reported diagnosis 1969   CHF (congestive heart failure)    Chronic kidney disease    left kidney small    Coronary artery disease    Diabetes mellitus, type 2    ORAL AND INSULIN MEDS (LAST A1C  7.3)   Diverticulosis    Erythema CURRENT-- CLOSED ABD. WALL ABSCESS   PER PCP NOTE (03-31-11)- MRSA-- TAKES DOXYCYCLINE   GERD (gastroesophageal reflux disease)    CONTROLLED W/ PREVACID   History of kidney stones 2012   Hyperlipidemia    Hypertension    Insomnia    TAKES MEDS PRN   Neuropathy, peripheral    Obesity (BMI 30-39.9) 02/19/2015   Pneumonia    as child   Post op infection 11/07/2012   left bunionectomy   Restless leg syndrome    Sepsis, urinary HISTORY - 2004   Sleep apnea    no cpap    Thyroid disease    TIA (transient ischemic attack)    "mini strokes" 2023   Tremor     Vitamin D deficiency    Past Surgical History:  Procedure Laterality Date   ABDOMINAL HYSTERECTOMY  1995   ovaries remain   BUNIONECTOMY Left 10/24/2012   BUNIONECTOMY Right 05/17/2017   CARDIAC CATHETERIZATION  07/10/2004   CARPAL TUNNEL RELEASE  2000   RIGHT   CATARACT EXTRACTION, BILATERAL     CERVICAL FUSION  10/12/2007   C5 - 7   COLONOSCOPY     CYSTOSCOPY WITH RETROGRADE PYELOGRAM, URETEROSCOPY AND STENT PLACEMENT Left 11/28/2012   Procedure: LEFT RETROGRADE PYELOGRAM, LEFT URETEROSCOPY, ;  Surgeon: Garnett Farm, MD;  Location: [facility];  Service: Urology;  Laterality: Left;   CYSTOSCOPY/RETROGRADE/URETEROSCOPY/STONE EXTRACTION WITH BASKET  X2 2004 & 2009   LEFT    GASTRIC BYPASS  1981   KNEE ARTHROSCOPY  12/2010   LEFT   LAPAROSCOPIC CHOLECYSTECTOMY  2001   LEFT HEART CATH AND CORONARY ANGIOGRAPHY N/A 12/03/2021   Procedure: LEFT HEART CATH AND CORONARY ANGIOGRAPHY;  Surgeon: Corky Crafts, MD;  Location: [facility] INVASIVE CV LAB;  Service: Cardiovascular;  Laterality: N/A;  left thumb joint surgery  2013   PERCUTANEOUS NEPHROSTOLITHOTOMY  02/27/2011   LEFT   POLYPECTOMY     REVERSE SHOULDER ARTHROPLASTY Right 07/12/2022   Procedure: REVERSE SHOULDER ARTHROPLASTY;  Surgeon: Huel Cote, MD;  Location: [facility];  Service: Orthopedics;  Laterality: Right;   RIGHT THUMB JOINT SURG.  09/2009   TRIGGER FINGER RELEASE  2010   RIGHT THUMB   UPPER GASTROINTESTINAL ENDOSCOPY     URETERAL STENT PLACEMENT  X2  2004  &  2009   LEFT   URETEROSCOPY  04/06/2011   Procedure: URETEROSCOPY;  Surgeon: Garnett Farm, MD;  Location: [facility];  Service: Urology;  Laterality: Left;  L Ureteroscopy Laser Litho & Stent    Patient Active Problem List   Diagnosis Date Noted   Closed 3-part fracture of proximal end of right humerus 07/11/2022   Shoulder dislocation 07/11/2022   Abnormal stress test    Atypical chest pain 08/05/2021   Bradycardia 06/20/2021   Abnormal  gait 03/17/2021   Acquired deformity of lower leg 03/17/2021   Urinary urgency 11/14/2020   Paresthesia 08/15/2020   Right lumbar pain 08/15/2020   Tremor 08/15/2020   Hypoglycemia associated with diabetes 02/29/2020   Essential (primary) hypertension 08/10/2019   Chronic neck pain 06/23/2016   Radicular low back pain 07/12/2015   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 without macular edema 04/24/2015   Severe obesity (BMI >= 40) 02/19/2015   OSA (obstructive sleep apnea) 10/23/2014   Excessive daytime sleepiness 10/23/2014   Hypothyroidism 09/07/2014   Tobacco abuse 01/15/2014   Recurrent UTI 09/06/2013   Orthostatic hypotension 10/05/2012   General medical examination 06/22/2011   Atrial tachycardia 11/25/2010   Pre-operative cardiovascular examination 11/25/2010   Chronic diarrhea 08/25/2010   KNEE PAIN 07/14/2010   Knee pain 07/14/2010   DIASTOLIC HEART FAILURE, CHRONIC 06/09/2010   PERSONAL HISTORY OF COLONIC POLYPS 05/01/2010   Vitamin D deficiency 01/20/2010   RESTLESS LEG SYNDROME 01/20/2010   Insomnia 01/20/2010   Restless leg syndrome 01/20/2010   Hyperlipidemia associated with type 2 diabetes mellitus 01/08/2009   Anemia 01/08/2009   GERD 01/08/2009   Benign hypertensive heart disease with heart failure 01/08/2009    PCP: Neena Rhymes MD   REFERRING PROVIDER: Huel Cote, MD  REFERRING DIAG: [phone] (ICD-10-CM) - Status post reverse arthroplasty of right shoulder  THERAPY DIAG:  Acute pain of right shoulder  Stiffness of right shoulder, not elsewhere classified  Muscle weakness (generalized)  Abnormal posture  Rationale for Evaluation and Treatment: Rehabilitation  ONSET DATE: Date of Surgery: 07/12/22  SUBJECTIVE:  SUBJECTIVE STATEMENT:  Did well with Rob yesterday, it hurt but it was a good session. Saw the doctor recently too, he said it looks good. Slept good last night, didn't wake up.   Hand dominance: Right  PERTINENT HISTORY: 72 year old female is 2 weeks status post right shoulder reverse shoulder arthroplasty overall doing very well. Because this is a reverse for fracture I will asked that she remain in the sling for an additional 2 weeks. She may come out of this for passive range of motion at this time 1 month postop. That time she will begin following the reverse for fracture protocol. Will plan on outpatient physical therapy.  PAIN:  Are you having pain? Yes: NPRS scale: 3/10 now  Pain location: from biceps going down arm  Pain description: sore Aggravating factors: staying in one place for too long, trying to move arm  Relieving factors: sitting still holding my fingers, ice   PRECAUTIONS: Other: reverse total shoulder for fracture protocol   WEIGHT BEARING RESTRICTIONS: Yes NWB surgical LE   FALLS:  Has patient fallen in last 6 months? Yes. Number of falls 2- one fall on ramp in the rain that led to fracture/surgery, other fall was the night she came home from the hospital and just passed out; FOF (+)  LIVING ENVIRONMENT: Lives with: lives with their spouse Lives in: House/apartment Stairs: No Has following equipment at home: Single point cane, Environmental consultant - 2 wheeled, Crutches, Wheelchair (manual), bed side commode, and Ramped entry + walk-in shower, shower bench   OCCUPATION: Retired   PLOF: Independent, Independent with basic ADLs, Independent with gait, and Independent with transfers  PATIENT GOALS: move my arm, be able to wash dishes, brush teeth R handed- generally improve level of function    OBJECTIVE:   PATIENT SURVEYS:  EVAL: FOTO 32, predicted 57  COGNITION: Overall cognitive status: Within functional limits for tasks assessed and very anxious about therapy       SENSATION: Reports numbness and burning in upper arm since surgery   POSTURE: Rounded shoulders, forward head, increased thoracic kyphosis   UPPER EXTREMITY ROM:   Passive ROM Right eval Right 08/11/22 Right 08/13/2022  Shoulder flexion 45-50* P: 87 100  Shoulder extension     Shoulder scaption <20*    Shoulder abduction  P: 78   Shoulder internal rotation   60  Shoulder external rotation -10* to -15* (edited 08/06/22, noted that I put ER value in IR column KEU) P: 20 30  Elbow flexion About 75% limited PROM, very guarded     Elbow extension WNL PROM but very guarded     (Blank rows = not tested)  UPPER EXTREMITY MMT:  MMT Right eval Left eval  Shoulder flexion    Shoulder extension    Shoulder abduction    Shoulder adduction    Shoulder internal rotation    Shoulder external rotation    Middle trapezius    Lower trapezius    Elbow flexion    Elbow extension    Wrist flexion    Wrist extension    Wrist ulnar deviation    Wrist radial deviation    Wrist pronation    Wrist supination    Grip strength (lbs)    (Blank rows = not tested)- DNT at eval due to limitations in protocol    TODAY'S TREATMENT:  DATE:   08/14/22  TherEx  PROM flexion, scaption, ER within limits of protocol - flexion to 100-110* - scaption to 100-110* - ER to 30*  at 0* ABD     AAROM (manually assisted by therapist, perhaps 60% by PT/40% by patient) - flexion to about 50* - scaption to 45-50* - ER to neutral at 0* ABD      08/13/2022 -Standing pendulums within comfortable range: side-to-side; forward and back and circles clockwise and counterclockwise 30 times each -Active assisted supine arm raises (scapular protraction with palm facing in and left side assisting) 2 sets of 10 for 3 seconds -Shoulder blade pinches 2 sets of 10 for 5  seconds  Manual PROM right shoulder flexion, IR and ER to 30 degrees  Ice right shoulder 5 minutes post exercises   08/11/22 TherEx Scapular retraction 2x10; 5 sec hold Rt shoulder flexion with LUE providing assist 2x10 Seated shoulder rolls backwards x 20 reps Seated forward table slides x 10 reps; LUE providing some assist  Manual Manual PROM right shoulder flexion, abduction, and ER to 30 degrees   08/07/2022 Standing pendulums: Limited range side-to-side; forward and back and circles clockwise and counterclockwise 30 times each Active assisted supine arm raises (scapular protraction with palm facing in and left side assisting) 2 sets of 10 for 3 seconds Shoulder blade pinches 2 sets of 10 for 5 seconds  Manual PROM right shoulder flexion, IR and ER to 30 degrees  Ice right shoulder 5 minutes post exercises   PATIENT EDUCATION: Education details: exam findings, POC, HEP; extensive education on typical recovery after shoulder surgery, also benefit of taking pain medicine prior to PT to make skilled interventions more tolerable  Person educated: Patient Education method: Explanation, Demonstration, and Handouts Education comprehension: verbalized understanding, returned demonstration, and needs further education  HOME EXERCISE PROGRAM: Access Code: [email or web address] URL: [email or web address] Date: 08/07/2022 Prepared by: Pauletta Browns  Exercises - Seated Shoulder Pendulum Exercise  - 3-5 x daily - 7 x weekly - 1 sets - 20 reps - Gentle Horizontal Shoulder Pendulum  - 3-5 x daily - 7 x weekly - 1 sets - 20 reps - Gentle Vertical Shoulder Pendulum  - 3-5 x daily - 7 x weekly - 1 sets - 20 reps - Standing Scapular Retraction  - 5 x daily - 7 x weekly - 1 sets - 5 reps - 5 second hold - Supine Scapular Protraction in Flexion with Dumbbells  - 2-3 x daily - 7 x weekly - 1 sets - 10-20 reps - 3 seconds hold  ASSESSMENT:  CLINICAL IMPRESSION: Akyah did well today,  progressed session and included some gentle AAROM (as per most recent MD note from 08/12/22). PROM slowly progressing, AAROM was a bit difficult as this was one of our first more focused attempts with this exercise. No increased pain. Will continue to progress as appropriate.   OBJECTIVE IMPAIRMENTS: decreased ROM, decreased strength, hypomobility, increased edema, increased fascial restrictions, increased muscle spasms, impaired flexibility, impaired UE functional use, postural dysfunction, obesity, and pain.   ACTIVITY LIMITATIONS: carrying, lifting, bed mobility, bathing, toileting, dressing, reach over head, and caring for others  PARTICIPATION LIMITATIONS: cleaning, laundry, driving, shopping, community activity, and yard work  PERSONAL FACTORS: Age, Behavior pattern, Education, Fitness, Past/current experiences, Sex, Social background, and Time since onset of injury/illness/exacerbation are also affecting patient's functional outcome.   REHAB POTENTIAL: Good  CLINICAL DECISION MAKING: Stable/uncomplicated  EVALUATION COMPLEXITY: Low   GOALS: Goals reviewed with patient? Yes  SHORT  TERM GOALS: Target date: 09/02/2022    Will be compliant with appropriate progressive HEP  Baseline: Goal status: On Going 08/13/2022  2.  R shoulder flexion and scaption PROM to be at least 100 degrees, R shoulder ER PROM to be at least 20 degrees at 0* ABD  Baseline:  Goal status: Met 08/13/2022  3.  R shoulder flexion and scaption AAROM to be at least 90 degrees, R shoulder ER AAROM to reach neutral  Baseline:  Goal status: On Going 08/13/2022  4.  Will demonstrate better functional posture/postural mechanics  Baseline:  Goal status: On Going 08/13/2022   LONG TERM GOALS: Target date: 09/30/2022    R shoulder flexion and scaption AROM to be at least 120 degrees  Baseline:  Goal status: On Going 08/13/2022  2.  R shoulder ER AROM to be at least 30 degrees at 45* ABD  Baseline:  Goal status:  On Going 08/13/2022  3.  Will be able to reach L5 level with active FIR no increased pain  Baseline:  Goal status: INITIAL  4.  Will be able to perform all dressing/bathing tasks on a Mod(I) basis no increase in pain  Baseline:  Goal status: INITIAL  5.  Will be able to reach overhead to put dishes in the cabinet without increase in pain  Baseline:  Goal status: INITIAL   PLAN:  PT FREQUENCY: 3x/week  PT DURATION: 8 weeks  PLANNED INTERVENTIONS: Therapeutic exercises, Therapeutic activity, Neuromuscular re-education, Balance training, Gait training, Patient/Family education, Self Care, Joint mobilization, Aquatic Therapy, Dry Needling, Electrical stimulation, Cryotherapy, Moist heat, Taping, Ultrasound, Ionotophoresis /ml Dexamethasone, Manual therapy, and Re-evaluation  PLAN FOR NEXT SESSION:  PROM, AAROM and appropriate AROM for flexion, IR and limited range ER.  Early phase scapular strength/functional work (without external resistance).  Progress per Dr. Serena Croissant protocol.  Consider pulley AAROM flexion with scapular protraction next week.  NEXT MD VISIT: 09/11/22  Nedra Hai PT DPT PN2

## 2022-08-17 ENCOUNTER — Encounter: Payer: Self-pay | Admitting: Physical Therapy

## 2022-08-17 ENCOUNTER — Ambulatory Visit: Payer: Medicare HMO | Admitting: Physical Therapy

## 2022-08-17 DIAGNOSIS — R293 Abnormal posture: Secondary | ICD-10-CM | POA: Diagnosis not present

## 2022-08-17 DIAGNOSIS — M25511 Pain in right shoulder: Secondary | ICD-10-CM

## 2022-08-17 DIAGNOSIS — M6281 Muscle weakness (generalized): Secondary | ICD-10-CM | POA: Diagnosis not present

## 2022-08-17 DIAGNOSIS — M25611 Stiffness of right shoulder, not elsewhere classified: Secondary | ICD-10-CM

## 2022-08-17 NOTE — Therapy (Signed)
OUTPATIENT PHYSICAL THERAPY SHOULDER TREATMENT   Patient Name: Lacey [name] MRN: [id number] DOB:[birth year], 72 y.o., female Today's Date: 08/17/2022  END OF SESSION:  PT End of Session - 08/17/22 1007     Visit Number 7    Number of Visits 25    Date for PT Re-Evaluation 09/30/22    Authorization Type [employer] MCR    Authorization Time Period 08/05/22 to 09/30/22    Progress Note Due on Visit 10    PT Start Time 1007    PT Stop Time 1050    PT Time Calculation (min) 43 min    Activity Tolerance Patient tolerated treatment well;No increased pain    Behavior During Therapy WFL for tasks assessed/performed                 Past Medical History:  Diagnosis Date   Adenomatous colon polyp    hyperplastic   Anemia    Anginal pain    not recent   Arthritis    NECK, KNEES, FINGERS, TOES   Atrial tachycardia CARDIOLOGIST - DR Graciela Husbands (LAST VISIT AUG 2012)   Echo 12/11: EF 55-60%, Mild LVH, grade 1 diast dysfxn;   holter 1/12: ATach   Atypical chest pain    a. 07/2004 Cath: Clean cors;  b. 04/2010 Myoview: EF 63%, no ischemia   Blood transfusion without reported diagnosis 1969   CHF (congestive heart failure)    Chronic kidney disease    left kidney small    Coronary artery disease    Diabetes mellitus, type 2    ORAL AND INSULIN MEDS (LAST A1C  7.3)   Diverticulosis    Erythema CURRENT-- CLOSED ABD. WALL ABSCESS   PER PCP NOTE (03-31-11)- MRSA-- TAKES DOXYCYCLINE   GERD (gastroesophageal reflux disease)    CONTROLLED W/ PREVACID   History of kidney stones 2012   Hyperlipidemia    Hypertension    Insomnia    TAKES MEDS PRN   Neuropathy, peripheral    Obesity (BMI 30-39.9) 02/19/2015   Pneumonia    as child   Post op infection 11/07/2012   left bunionectomy   Restless leg syndrome    Sepsis, urinary HISTORY - 2004   Sleep apnea    no cpap    Thyroid disease    TIA (transient ischemic attack)    "mini strokes" 2023   Tremor    Vitamin D deficiency    Past  Surgical History:  Procedure Laterality Date   ABDOMINAL HYSTERECTOMY  1995   ovaries remain   BUNIONECTOMY Left 10/24/2012   BUNIONECTOMY Right 05/17/2017   CARDIAC CATHETERIZATION  07/10/2004   CARPAL TUNNEL RELEASE  2000   RIGHT   CATARACT EXTRACTION, BILATERAL     CERVICAL FUSION  10/12/2007   C5 - 7   COLONOSCOPY     CYSTOSCOPY WITH RETROGRADE PYELOGRAM, URETEROSCOPY AND STENT PLACEMENT Left 11/28/2012   Procedure: LEFT RETROGRADE PYELOGRAM, LEFT URETEROSCOPY, ;  Surgeon: Garnett Farm, MD;  Location: [facility];  Service: Urology;  Laterality: Left;   CYSTOSCOPY/RETROGRADE/URETEROSCOPY/STONE EXTRACTION WITH BASKET  X2 2004 & 2009   LEFT    GASTRIC BYPASS  1981   KNEE ARTHROSCOPY  12/2010   LEFT   LAPAROSCOPIC CHOLECYSTECTOMY  2001   LEFT HEART CATH AND CORONARY ANGIOGRAPHY N/A 12/03/2021   Procedure: LEFT HEART CATH AND CORONARY ANGIOGRAPHY;  Surgeon: Corky Crafts, MD;  Location: [facility] INVASIVE [facility];  Service: Cardiovascular;  Laterality: N/A;   left thumb joint surgery  2013  PERCUTANEOUS NEPHROSTOLITHOTOMY  02/27/2011   LEFT   POLYPECTOMY     REVERSE SHOULDER ARTHROPLASTY Right 07/12/2022   Procedure: REVERSE SHOULDER ARTHROPLASTY;  Surgeon: Huel Cote, MD;  Location: [facility];  Service: Orthopedics;  Laterality: Right;   RIGHT THUMB JOINT SURG.  09/2009   TRIGGER FINGER RELEASE  2010   RIGHT THUMB   UPPER GASTROINTESTINAL ENDOSCOPY     URETERAL STENT PLACEMENT  X2  2004  &  2009   LEFT   URETEROSCOPY  04/06/2011   Procedure: URETEROSCOPY;  Surgeon: Garnett Farm, MD;  Location: [facility];  Service: Urology;  Laterality: Left;  L Ureteroscopy Laser Litho & Stent    Patient Active Problem List   Diagnosis Date Noted   Closed 3-part fracture of proximal end of right humerus 07/11/2022   Shoulder dislocation 07/11/2022   Abnormal stress test    Atypical chest pain 08/05/2021   Bradycardia 06/20/2021   Abnormal gait 03/17/2021   Acquired  deformity of lower leg 03/17/2021   Urinary urgency 11/14/2020   Paresthesia 08/15/2020   Right lumbar pain 08/15/2020   Tremor 08/15/2020   Hypoglycemia associated with diabetes 02/29/2020   Essential (primary) hypertension 08/10/2019   Chronic neck pain 06/23/2016   Radicular low back pain 07/12/2015   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 without macular edema 04/24/2015   Severe obesity (BMI >= 40) 02/19/2015   OSA (obstructive sleep apnea) 10/23/2014   Excessive daytime sleepiness 10/23/2014   Hypothyroidism 09/07/2014   Tobacco abuse 01/15/2014   Recurrent UTI 09/06/2013   Orthostatic hypotension 10/05/2012   General medical examination 06/22/2011   Atrial tachycardia 11/25/2010   Pre-operative cardiovascular examination 11/25/2010   Chronic diarrhea 08/25/2010   KNEE PAIN 07/14/2010   Knee pain 07/14/2010   DIASTOLIC HEART FAILURE, CHRONIC 06/09/2010   PERSONAL HISTORY OF COLONIC POLYPS 05/01/2010   Vitamin D deficiency 01/20/2010   RESTLESS LEG SYNDROME 01/20/2010   Insomnia 01/20/2010   Restless leg syndrome 01/20/2010   Hyperlipidemia associated with type 2 diabetes mellitus 01/08/2009   Anemia 01/08/2009   GERD 01/08/2009   Benign hypertensive heart disease with heart failure 01/08/2009    PCP: Neena Rhymes MD   REFERRING PROVIDER: Huel Cote, MD  REFERRING DIAG: [phone] (ICD-10-CM) - Status post reverse arthroplasty of right shoulder  THERAPY DIAG:  Acute pain of right shoulder  Stiffness of right shoulder, not elsewhere classified  Muscle weakness (generalized)  Abnormal posture  Rationale for Evaluation and Treatment: Rehabilitation  ONSET DATE: Date of Surgery: 07/12/22  SUBJECTIVE:  SUBJECTIVE STATEMENT: Did  a lot of exercises this weekend and shoulder was sore.    Hand dominance: Right  PERTINENT HISTORY: 72 year old female is 2 weeks status post right shoulder reverse shoulder arthroplasty overall doing very well. Because this is a reverse for fracture I will asked that she remain in the sling for an additional 2 weeks. She may come out of this for passive range of motion at this time 1 month postop. That time she will begin following the reverse for fracture protocol. Will plan on outpatient physical therapy.  PAIN:  Are you having pain? Yes: NPRS scale: 4/10 now  Pain location: from biceps going down arm  Pain description: sore Aggravating factors: staying in one place for too long, trying to move arm  Relieving factors: sitting still holding my fingers, ice   PRECAUTIONS: Other: reverse total shoulder for fracture protocol   WEIGHT BEARING RESTRICTIONS: Yes NWB surgical LE   FALLS:  Has patient fallen in last 6 months? Yes. Number of falls 2- one fall on ramp in the rain that led to fracture/surgery, other fall was the night she came home from the hospital and just passed out; FOF (+)  LIVING ENVIRONMENT: Lives with: lives with their spouse Lives in: House/apartment Stairs: No Has following equipment at home: Single point cane, Environmental consultant - 2 wheeled, Crutches, Wheelchair (manual), bed side commode, and Ramped entry + walk-in shower, shower bench   OCCUPATION: Retired   PLOF: Independent, Independent with basic ADLs, Independent with gait, and Independent with transfers  PATIENT GOALS: move my arm, be able to wash dishes, brush teeth R handed- generally improve level of function    OBJECTIVE:   PATIENT SURVEYS:  EVAL: FOTO 32, predicted 57  COGNITION: Overall cognitive status: Within functional limits for tasks assessed and very anxious about therapy      SENSATION: Reports numbness and burning in upper arm since surgery   POSTURE: Rounded shoulders, forward head,  increased thoracic kyphosis   UPPER EXTREMITY ROM:   Passive ROM Right eval Right 08/11/22 Right 08/13/2022 Right 08/17/22  Shoulder flexion 45-50* P: 87 100 P: 110  Shoulder extension      Shoulder scaption <20*     Shoulder abduction  P: 78  P: 110  Shoulder internal rotation   60   Shoulder external rotation -10* to -15* (edited 08/06/22, noted that I put ER value in IR column KEU) P: 20 30   Elbow flexion About 75% limited PROM, very guarded      Elbow extension WNL PROM but very guarded      (Blank rows = not tested)  UPPER EXTREMITY MMT:  MMT Right eval Left eval  Shoulder flexion    Shoulder extension    Shoulder abduction    Shoulder adduction    Shoulder internal rotation    Shoulder external rotation    Middle trapezius    Lower trapezius    Elbow flexion    Elbow extension    Wrist flexion    Wrist extension    Wrist ulnar deviation    Wrist radial deviation    Wrist pronation    Wrist supination    Grip strength (lbs)    (Blank rows = not tested)- DNT at eval due to limitations in protocol    TODAY'S TREATMENT:  DATE:  08/17/22 TherEx Pulleys x 5 min flexion Scapular retraction 2x10; 5 sec hold sitting AA standing wall slides x 10 reps; flexion and scaption Supine AA flexion and abduction x 15-20 reps with PT providing assist Supine AA circles x 5 reps each direction  Manual PROM Rt shoulder to tolerance ER, flexion and scaption PROM measurements - see above  08/14/22 TherEx PROM flexion, scaption, ER within limits of protocol - flexion to 100-110* - scaption to 100-110* - ER to 30*  at 0* ABD   AAROM (manually assisted by therapist, perhaps 60% by PT/40% by patient) - flexion to about 50* - scaption to 45-50* - ER to neutral at 0* ABD    08/13/2022 -Standing pendulums within comfortable range: side-to-side;  forward and back and circles clockwise and counterclockwise 30 times each -Active assisted supine arm raises (scapular protraction with palm facing in and left side assisting) 2 sets of 10 for 3 seconds -Shoulder blade pinches 2 sets of 10 for 5 seconds  Manual PROM right shoulder flexion, IR and ER to 30 degrees  Ice right shoulder 5 minutes post exercises   08/11/22 TherEx Scapular retraction 2x10; 5 sec hold Rt shoulder flexion with LUE providing assist 2x10 Seated shoulder rolls backwards x 20 reps Seated forward table slides x 10 reps; LUE providing some assist  Manual Manual PROM right shoulder flexion, abduction, and ER to 30 degrees   PATIENT EDUCATION: Education details: exam findings, POC, HEP; extensive education on typical recovery after shoulder surgery, also benefit of taking pain medicine prior to PT to make skilled interventions more tolerable  Person educated: Patient Education method: Explanation, Demonstration, and Handouts Education comprehension: verbalized understanding, returned demonstration, and needs further education  HOME EXERCISE PROGRAM: Access Code: [email or web address] URL: [email or web address] Date: 08/07/2022 Prepared by: Pauletta Browns  Exercises - Seated Shoulder Pendulum Exercise  - 3-5 x daily - 7 x weekly - 1 sets - 20 reps - Gentle Horizontal Shoulder Pendulum  - 3-5 x daily - 7 x weekly - 1 sets - 20 reps - Gentle Vertical Shoulder Pendulum  - 3-5 x daily - 7 x weekly - 1 sets - 20 reps - Standing Scapular Retraction  - 5 x daily - 7 x weekly - 1 sets - 5 reps - 5 second hold - Supine Scapular Protraction in Flexion with Dumbbells  - 2-3 x daily - 7 x weekly - 1 sets - 10-20 reps - 3 seconds hold  ASSESSMENT:  CLINICAL IMPRESSION: Pt tolerated session well today with improvements in PROM noted.  Good tolerance to pulleys and AA exercises today.  Will continue to benefit from PT to maximize function.  OBJECTIVE IMPAIRMENTS: decreased  ROM, decreased strength, hypomobility, increased edema, increased fascial restrictions, increased muscle spasms, impaired flexibility, impaired UE functional use, postural dysfunction, obesity, and pain.   ACTIVITY LIMITATIONS: carrying, lifting, bed mobility, bathing, toileting, dressing, reach over head, and caring for others  PARTICIPATION LIMITATIONS: cleaning, laundry, driving, shopping, community activity, and yard work  PERSONAL FACTORS: Age, Behavior pattern, Education, Fitness, Past/current experiences, Sex, Social background, and Time since onset of injury/illness/exacerbation are also affecting patient's functional outcome.   REHAB POTENTIAL: Good  CLINICAL DECISION MAKING: Stable/uncomplicated  EVALUATION COMPLEXITY: Low   GOALS: Goals reviewed with patient? Yes  SHORT TERM GOALS: Target date: 09/02/2022   Will be compliant with appropriate progressive HEP  Baseline: Goal status: On Going 08/13/2022  2.  R shoulder flexion and scaption PROM to be at least 100 degrees, R shoulder  ER PROM to be at least 20 degrees at 0* ABD  Baseline:  Goal status: Met 08/13/2022  3.  R shoulder flexion and scaption AAROM to be at least 90 degrees, R shoulder ER AAROM to reach neutral  Baseline:  Goal status: On Going 08/13/2022  4.  Will demonstrate better functional posture/postural mechanics  Baseline:  Goal status: On Going 08/13/2022   LONG TERM GOALS: Target date: 09/30/2022    R shoulder flexion and scaption AROM to be at least 120 degrees  Baseline:  Goal status: On Going 08/13/2022  2.  R shoulder ER AROM to be at least 30 degrees at 45* ABD  Baseline:  Goal status: On Going 08/13/2022  3.  Will be able to reach L5 level with active FIR no increased pain  Baseline:  Goal status: INITIAL  4.  Will be able to perform all dressing/bathing tasks on a Mod(I) basis no increase in pain  Baseline:  Goal status: INITIAL  5.  Will be able to reach overhead to put dishes in the  cabinet without increase in pain  Baseline:  Goal status: INITIAL   PLAN:  PT FREQUENCY: 3x/week  PT DURATION: 8 weeks  PLANNED INTERVENTIONS: Therapeutic exercises, Therapeutic activity, Neuromuscular re-education, Balance training, Gait training, Patient/Family education, Self Care, Joint mobilization, Aquatic Therapy, Dry Needling, Electrical stimulation, Cryotherapy, Moist heat, Taping, Ultrasound, Ionotophoresis /ml Dexamethasone, Manual therapy, and Re-evaluation  PLAN FOR NEXT SESSION:  PROM, AAROM and appropriate AROM for flexion, IR and limited range ER.  Early phase scapular strength/functional work (without external resistance).  Progress per Dr. Serena Croissant protocol.  Consider pulley AAROM flexion with scapular protraction next week.  NEXT MD VISIT: 09/11/22  Clarita Crane, PT, DPT 08/17/22 10:56 AM

## 2022-08-18 ENCOUNTER — Ambulatory Visit: Payer: Medicare HMO | Admitting: Physical Therapy

## 2022-08-18 ENCOUNTER — Encounter: Payer: Self-pay | Admitting: Physical Therapy

## 2022-08-18 DIAGNOSIS — M25511 Pain in right shoulder: Secondary | ICD-10-CM

## 2022-08-18 DIAGNOSIS — M6281 Muscle weakness (generalized): Secondary | ICD-10-CM | POA: Diagnosis not present

## 2022-08-18 DIAGNOSIS — R293 Abnormal posture: Secondary | ICD-10-CM | POA: Diagnosis not present

## 2022-08-18 DIAGNOSIS — M25611 Stiffness of right shoulder, not elsewhere classified: Secondary | ICD-10-CM

## 2022-08-18 NOTE — Therapy (Signed)
OUTPATIENT PHYSICAL THERAPY SHOULDER TREATMENT   Patient Name: Lacey [name] MRN: [id number] DOB:[birth year], 72 y.o., female Today's Date: 08/18/2022  END OF SESSION:  PT End of Session - 08/18/22 1055     Visit Number 8    Number of Visits 25    Date for PT Re-Evaluation 09/30/22    Authorization Type [employer] MCR    Authorization Time Period 08/05/22 to 09/30/22    Progress Note Due on Visit 10    PT Start Time 1056    PT Stop Time 1140    PT Time Calculation (min) 44 min    Activity Tolerance Patient tolerated treatment well    Behavior During Therapy WFL for tasks assessed/performed                  Past Medical History:  Diagnosis Date   Adenomatous colon polyp    hyperplastic   Anemia    Anginal pain    not recent   Arthritis    NECK, KNEES, FINGERS, TOES   Atrial tachycardia CARDIOLOGIST - DR Graciela Husbands (LAST VISIT AUG 2012)   Echo 12/11: EF 55-60%, Mild LVH, grade 1 diast dysfxn;   holter 1/12: ATach   Atypical chest pain    a. 07/2004 Cath: Clean cors;  b. 04/2010 Myoview: EF 63%, no ischemia   Blood transfusion without reported diagnosis 1969   CHF (congestive heart failure)    Chronic kidney disease    left kidney small    Coronary artery disease    Diabetes mellitus, type 2    ORAL AND INSULIN MEDS (LAST A1C  7.3)   Diverticulosis    Erythema CURRENT-- CLOSED ABD. WALL ABSCESS   PER PCP NOTE (03-31-11)- MRSA-- TAKES DOXYCYCLINE   GERD (gastroesophageal reflux disease)    CONTROLLED W/ PREVACID   History of kidney stones 2012   Hyperlipidemia    Hypertension    Insomnia    TAKES MEDS PRN   Neuropathy, peripheral    Obesity (BMI 30-39.9) 02/19/2015   Pneumonia    as child   Post op infection 11/07/2012   left bunionectomy   Restless leg syndrome    Sepsis, urinary HISTORY - 2004   Sleep apnea    no cpap    Thyroid disease    TIA (transient ischemic attack)    "mini strokes" 2023   Tremor    Vitamin D deficiency    Past Surgical History:   Procedure Laterality Date   ABDOMINAL HYSTERECTOMY  1995   ovaries remain   BUNIONECTOMY Left 10/24/2012   BUNIONECTOMY Right 05/17/2017   CARDIAC CATHETERIZATION  07/10/2004   CARPAL TUNNEL RELEASE  2000   RIGHT   CATARACT EXTRACTION, BILATERAL     CERVICAL FUSION  10/12/2007   C5 - 7   COLONOSCOPY     CYSTOSCOPY WITH RETROGRADE PYELOGRAM, URETEROSCOPY AND STENT PLACEMENT Left 11/28/2012   Procedure: LEFT RETROGRADE PYELOGRAM, LEFT URETEROSCOPY, ;  Surgeon: Garnett Farm, MD;  Location: [facility];  Service: Urology;  Laterality: Left;   CYSTOSCOPY/RETROGRADE/URETEROSCOPY/STONE EXTRACTION WITH BASKET  X2 2004 & 2009   LEFT    GASTRIC BYPASS  1981   KNEE ARTHROSCOPY  12/2010   LEFT   LAPAROSCOPIC CHOLECYSTECTOMY  2001   LEFT HEART CATH AND CORONARY ANGIOGRAPHY N/A 12/03/2021   Procedure: LEFT HEART CATH AND CORONARY ANGIOGRAPHY;  Surgeon: Corky Crafts, MD;  Location: [facility] INVASIVE CV LAB;  Service: Cardiovascular;  Laterality: N/A;   left thumb joint surgery  2013  PERCUTANEOUS NEPHROSTOLITHOTOMY  02/27/2011   LEFT   POLYPECTOMY     REVERSE SHOULDER ARTHROPLASTY Right 07/12/2022   Procedure: REVERSE SHOULDER ARTHROPLASTY;  Surgeon: Huel Cote, MD;  Location: [facility];  Service: Orthopedics;  Laterality: Right;   RIGHT THUMB JOINT SURG.  09/2009   TRIGGER FINGER RELEASE  2010   RIGHT THUMB   UPPER GASTROINTESTINAL ENDOSCOPY     URETERAL STENT PLACEMENT  X2  2004  &  2009   LEFT   URETEROSCOPY  04/06/2011   Procedure: URETEROSCOPY;  Surgeon: Garnett Farm, MD;  Location: [facility];  Service: Urology;  Laterality: Left;  L Ureteroscopy Laser Litho & Stent    Patient Active Problem List   Diagnosis Date Noted   Closed 3-part fracture of proximal end of right humerus 07/11/2022   Shoulder dislocation 07/11/2022   Abnormal stress test    Atypical chest pain 08/05/2021   Bradycardia 06/20/2021   Abnormal gait 03/17/2021   Acquired deformity of lower leg  03/17/2021   Urinary urgency 11/14/2020   Paresthesia 08/15/2020   Right lumbar pain 08/15/2020   Tremor 08/15/2020   Hypoglycemia associated with diabetes 02/29/2020   Essential (primary) hypertension 08/10/2019   Chronic neck pain 06/23/2016   Radicular low back pain 07/12/2015   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 without macular edema 04/24/2015   Severe obesity (BMI >= 40) 02/19/2015   OSA (obstructive sleep apnea) 10/23/2014   Excessive daytime sleepiness 10/23/2014   Hypothyroidism 09/07/2014   Tobacco abuse 01/15/2014   Recurrent UTI 09/06/2013   Orthostatic hypotension 10/05/2012   General medical examination 06/22/2011   Atrial tachycardia 11/25/2010   Pre-operative cardiovascular examination 11/25/2010   Chronic diarrhea 08/25/2010   KNEE PAIN 07/14/2010   Knee pain 07/14/2010   DIASTOLIC HEART FAILURE, CHRONIC 06/09/2010   PERSONAL HISTORY OF COLONIC POLYPS 05/01/2010   Vitamin D deficiency 01/20/2010   RESTLESS LEG SYNDROME 01/20/2010   Insomnia 01/20/2010   Restless leg syndrome 01/20/2010   Hyperlipidemia associated with type 2 diabetes mellitus 01/08/2009   Anemia 01/08/2009   GERD 01/08/2009   Benign hypertensive heart disease with heart failure 01/08/2009    PCP: Neena Rhymes MD   REFERRING PROVIDER: Huel Cote, MD  REFERRING DIAG: [phone] (ICD-10-CM) - Status post reverse arthroplasty of right shoulder  THERAPY DIAG:  Acute pain of right shoulder  Stiffness of right shoulder, not elsewhere classified  Muscle weakness (generalized)  Abnormal posture  Rationale for Evaluation and Treatment: Rehabilitation  ONSET DATE: Date of Surgery: 07/12/22  SUBJECTIVE:  SUBJECTIVE STATEMENT:  I'm a little sore in the  bicep area, I've had this for awhile. I go without the sling unless I'm riding or going somewhere crowded. I try to raise my arm up and do more with my hand, its like a sharp feeling like things are "coming alive". Still sore especially after therapy.   Hand dominance: Right  PERTINENT HISTORY: 72 year old female is 2 weeks status post right shoulder reverse shoulder arthroplasty overall doing very well. Because this is a reverse for fracture I will asked that she remain in the sling for an additional 2 weeks. She may come out of this for passive range of motion at this time 1 month postop. That time she will begin following the reverse for fracture protocol. Will plan on outpatient physical therapy.  PAIN:  Are you having pain? Yes: NPRS scale: 5/10 now  Pain location: from biceps going down arm  Pain description: sore Aggravating factors: staying in one place for too long, trying to move arm, sometimes trying to use arm  Relieving factors: sitting still holding my fingers, ice, rest    PRECAUTIONS: Other: reverse total shoulder for fracture protocol   WEIGHT BEARING RESTRICTIONS: Yes NWB surgical LE   FALLS:  Has patient fallen in last 6 months? Yes. Number of falls 2- one fall on ramp in the rain that led to fracture/surgery, other fall was the night she came home from the hospital and just passed out; FOF (+)  LIVING ENVIRONMENT: Lives with: lives with their spouse Lives in: House/apartment Stairs: No Has following equipment at home: Single point cane, Environmental consultant - 2 wheeled, Crutches, Wheelchair (manual), bed side commode, and Ramped entry + walk-in shower, shower bench   OCCUPATION: Retired   PLOF: Independent, Independent with basic ADLs, Independent with gait, and Independent with transfers  PATIENT GOALS: move my arm, be able to wash dishes, brush teeth R handed- generally improve level of function    OBJECTIVE:   PATIENT SURVEYS:  EVAL: FOTO 32, predicted  57  COGNITION: Overall cognitive status: Within functional limits for tasks assessed and very anxious about therapy      SENSATION: Reports numbness and burning in upper arm since surgery   POSTURE: Rounded shoulders, forward head, increased thoracic kyphosis   UPPER EXTREMITY ROM:   Passive ROM Right eval Right 08/11/22 Right 08/13/2022 Right 08/17/22  Shoulder flexion 45-50* P: 87 100 P: 110  Shoulder extension      Shoulder scaption <20*     Shoulder abduction  P: 78  P: 110  Shoulder internal rotation   60   Shoulder external rotation -10* to -15* (edited 08/06/22, noted that I put ER value in IR column KEU) P: 20 30   Elbow flexion About 75% limited PROM, very guarded      Elbow extension WNL PROM but very guarded      (Blank rows = not tested)  UPPER EXTREMITY MMT:  MMT Right eval Left eval  Shoulder flexion    Shoulder extension    Shoulder abduction    Shoulder adduction    Shoulder internal rotation    Shoulder external rotation    Middle trapezius    Lower trapezius    Elbow flexion    Elbow extension    Wrist flexion    Wrist extension    Wrist ulnar deviation    Wrist radial deviation    Wrist pronation    Wrist supination    Grip strength (lbs)    (Blank  rows = not tested)- DNT at eval due to limitations in protocol    TODAY'S TREATMENT:                                                                                                                                         DATE:   08/18/22  TherEx  PROM/stretching flexion, scaption, ER to tolerance/within limits of protocol- went slow and easy, very guarded and pain limited today but able to reach 110* in flexion and scaption, 30* ER with arm at side Therapist assisted AAROM supine- flexion (to 90* today), scaption (to 110*) , ER with in limits of protocol    08/17/22 TherEx Pulleys x 5 min flexion Scapular retraction 2x10; 5 sec hold sitting AA standing wall slides x 10 reps; flexion and  scaption Supine AA flexion and abduction x 15-20 reps with PT providing assist Supine AA circles x 5 reps each direction  Manual PROM Rt shoulder to tolerance ER, flexion and scaption PROM measurements - see above  08/14/22 TherEx PROM flexion, scaption, ER within limits of protocol - flexion to 100-110* - scaption to 100-110* - ER to 30*  at 0* ABD   AAROM (manually assisted by therapist, perhaps 60% by PT/40% by patient) - flexion to about 50* - scaption to 45-50* - ER to neutral at 0* ABD    08/13/2022 -Standing pendulums within comfortable range: side-to-side; forward and back and circles clockwise and counterclockwise 30 times each -Active assisted supine arm raises (scapular protraction with palm facing in and left side assisting) 2 sets of 10 for 3 seconds -Shoulder blade pinches 2 sets of 10 for 5 seconds  Manual PROM right shoulder flexion, IR and ER to 30 degrees  Ice right shoulder 5 minutes post exercises   08/11/22 TherEx Scapular retraction 2x10; 5 sec hold Rt shoulder flexion with LUE providing assist 2x10 Seated shoulder rolls backwards x 20 reps Seated forward table slides x 10 reps; LUE providing some assist  Manual Manual PROM right shoulder flexion, abduction, and ER to 30 degrees   PATIENT EDUCATION: Education details: exam findings, POC, HEP; extensive education on typical recovery after shoulder surgery, also benefit of taking pain medicine prior to PT to make skilled interventions more tolerable  Person educated: Patient Education method: Explanation, Demonstration, and Handouts Education comprehension: verbalized understanding, returned demonstration, and needs further education  HOME EXERCISE PROGRAM: Access Code: [email or web address] URL: [email or web address] Date: 08/07/2022 Prepared by: Pauletta Browns  Exercises - Seated Shoulder Pendulum Exercise  - 3-5 x daily - 7 x weekly - 1 sets - 20 reps - Gentle Horizontal Shoulder Pendulum  -  3-5 x daily - 7 x weekly - 1 sets - 20 reps - Gentle Vertical Shoulder Pendulum  - 3-5 x daily - 7 x weekly - 1 sets - 20 reps - Standing Scapular Retraction  - 5 x daily - 7 x weekly - 1  sets - 5 reps - 5 second hold - Supine Scapular Protraction in Flexion with Dumbbells  - 2-3 x daily - 7 x weekly - 1 sets - 10-20 reps - 3 seconds hold  ASSESSMENT:  CLINICAL IMPRESSION:  Arlyss arrives today doing OK, her shoulder was a bit more sore and stiff today. We took it slow and focused mostly on PROM, worked in some AAROM today as tolerated but she was more pain limited than her usual. Hopefully shoulder will be feeling a bit better and we can return to prior intensity next session.   OBJECTIVE IMPAIRMENTS: decreased ROM, decreased strength, hypomobility, increased edema, increased fascial restrictions, increased muscle spasms, impaired flexibility, impaired UE functional use, postural dysfunction, obesity, and pain.   ACTIVITY LIMITATIONS: carrying, lifting, bed mobility, bathing, toileting, dressing, reach over head, and caring for others  PARTICIPATION LIMITATIONS: cleaning, laundry, driving, shopping, community activity, and yard work  PERSONAL FACTORS: Age, Behavior pattern, Education, Fitness, Past/current experiences, Sex, Social background, and Time since onset of injury/illness/exacerbation are also affecting patient's functional outcome.   REHAB POTENTIAL: Good  CLINICAL DECISION MAKING: Stable/uncomplicated  EVALUATION COMPLEXITY: Low   GOALS: Goals reviewed with patient? Yes  SHORT TERM GOALS: Target date: 09/02/2022   Will be compliant with appropriate progressive HEP  Baseline: Goal status: On Going 08/13/2022  2.  R shoulder flexion and scaption PROM to be at least 100 degrees, R shoulder ER PROM to be at least 20 degrees at 0* ABD  Baseline:  Goal status: Met 08/13/2022  3.  R shoulder flexion and scaption AAROM to be at least 90 degrees, R shoulder ER AAROM to reach  neutral  Baseline:  Goal status: On Going 08/13/2022  4.  Will demonstrate better functional posture/postural mechanics  Baseline:  Goal status: On Going 08/13/2022   LONG TERM GOALS: Target date: 09/30/2022    R shoulder flexion and scaption AROM to be at least 120 degrees  Baseline:  Goal status: On Going 08/13/2022  2.  R shoulder ER AROM to be at least 30 degrees at 45* ABD  Baseline:  Goal status: On Going 08/13/2022  3.  Will be able to reach L5 level with active FIR no increased pain  Baseline:  Goal status: INITIAL  4.  Will be able to perform all dressing/bathing tasks on a Mod(I) basis no increase in pain  Baseline:  Goal status: INITIAL  5.  Will be able to reach overhead to put dishes in the cabinet without increase in pain  Baseline:  Goal status: INITIAL   PLAN:  PT FREQUENCY: 3x/week  PT DURATION: 8 weeks  PLANNED INTERVENTIONS: Therapeutic exercises, Therapeutic activity, Neuromuscular re-education, Balance training, Gait training, Patient/Family education, Self Care, Joint mobilization, Aquatic Therapy, Dry Needling, Electrical stimulation, Cryotherapy, Moist heat, Taping, Ultrasound, Ionotophoresis 4mg /ml Dexamethasone, Manual therapy, and Re-evaluation  PLAN FOR NEXT SESSION:  PROM, AAROM and appropriate AROM for flexion, IR and limited range ER.  Early phase scapular strength/functional work (without external resistance).  Progress per Dr. Serena Croissant protocol.  Consider pulley AAROM flexion with scapular protraction next week. How is her pain?   NEXT MD VISIT: 09/11/22  Nedra Hai PT DPT PN2

## 2022-08-19 ENCOUNTER — Encounter: Payer: Self-pay | Admitting: Family Medicine

## 2022-08-19 ENCOUNTER — Ambulatory Visit (INDEPENDENT_AMBULATORY_CARE_PROVIDER_SITE_OTHER): Payer: Medicare HMO | Admitting: Family Medicine

## 2022-08-19 VITALS — BP 132/68 | HR 60 | Temp 97.8°F | Resp 17 | Ht 62.0 in | Wt 217.5 lb

## 2022-08-19 DIAGNOSIS — R69 Illness, unspecified: Secondary | ICD-10-CM | POA: Diagnosis not present

## 2022-08-19 DIAGNOSIS — F32A Depression, unspecified: Secondary | ICD-10-CM | POA: Insufficient documentation

## 2022-08-19 DIAGNOSIS — F419 Anxiety disorder, unspecified: Secondary | ICD-10-CM | POA: Diagnosis not present

## 2022-08-19 MED ORDER — SERTRALINE HCL 50 MG PO TABS: 50.0000 mg | ORAL_TABLET | Freq: Every day | ORAL | 3 refills | Status: DC

## 2022-08-19 NOTE — Progress Notes (Signed)
   Subjective:    Patient ID: Lacey [name], female    DOB: [birth year], 72 y.o.   MRN: [id number]  HPI Anxiety- at last visit pt was started on Sertraline  daily.  Pt feels that medication is helping.  Still having some depression b/c she is very limited due to her shoulder surgery.  No longer having racing thoughts.     Review of Systems For ROS see HPI     Objective:   Physical Exam Vitals reviewed.  Constitutional:      General: She is not in acute distress.    Appearance: Normal appearance. She is not ill-appearing.  HENT:     Head: Normocephalic and atraumatic.  Skin:    General: Skin is warm and dry.  Neurological:     General: No focal deficit present.     Mental Status: She is alert and oriented to person, place, and time.  Psychiatric:        Mood and Affect: Mood normal.        Behavior: Behavior normal.        Thought Content: Thought content normal.           Assessment & Plan:

## 2022-08-19 NOTE — Patient Instructions (Signed)
Follow up in 6 weeks to recheck mood INCREASE the Sertraline to  daily- 2 of what you have at home and 1 of the new prescription Keep up the good work!  You're doing great! Call with any questions or concerns Hang in there!!!

## 2022-08-19 NOTE — Assessment & Plan Note (Signed)
Improved w/ addition of Sertraline but has room for better symptom control.  Will increase Sertraline to  daily.  Pt agreeable.  Knows to take 2 of what she has at home and 1 of the new prescription.  Will follow.

## 2022-08-21 ENCOUNTER — Encounter: Payer: Self-pay | Admitting: Rehabilitative and Restorative Service Providers"

## 2022-08-21 ENCOUNTER — Ambulatory Visit: Payer: Medicare HMO | Admitting: Rehabilitative and Restorative Service Providers"

## 2022-08-21 DIAGNOSIS — M6281 Muscle weakness (generalized): Secondary | ICD-10-CM

## 2022-08-21 DIAGNOSIS — M25511 Pain in right shoulder: Secondary | ICD-10-CM

## 2022-08-21 DIAGNOSIS — R293 Abnormal posture: Secondary | ICD-10-CM | POA: Diagnosis not present

## 2022-08-21 DIAGNOSIS — M25611 Stiffness of right shoulder, not elsewhere classified: Secondary | ICD-10-CM

## 2022-08-21 NOTE — Therapy (Signed)
OUTPATIENT PHYSICAL THERAPY SHOULDER TREATMENT   Patient Name: Lacey [name] MRN: [id number] DOB:1951/05/04, 72 y.o., female Today's Date: 08/21/2022  END OF SESSION:  PT End of Session - 08/21/22 1151     Visit Number 9    Number of Visits 25    Date for PT Re-Evaluation 09/30/22    Authorization Type [employer] [facility]    Authorization Time Period 08/05/22 to 09/30/22    Authorization - Visit Number 9    Progress Note Due on Visit 10    PT Start Time 1148    PT Stop Time 1233    PT Time Calculation (min) 45 min    Activity Tolerance Patient tolerated treatment well;No increased pain;Patient limited by pain    Behavior During Therapy [facility] for tasks assessed/performed               Past Medical History:  Diagnosis Date   Adenomatous colon polyp    hyperplastic   Anemia    Anginal pain    not recent   Arthritis    NECK, KNEES, FINGERS, TOES   Atrial tachycardia CARDIOLOGIST - DR Graciela Husbands (LAST VISIT AUG 2012)   Echo 12/11: EF 55-60%, Mild LVH, grade 1 diast dysfxn;   holter 1/12: ATach   Atypical chest pain    a. 07/2004 Cath: Clean cors;  b. 04/2010 Myoview: EF 63%, no ischemia   Blood transfusion without reported diagnosis 1969   CHF (congestive heart failure)    Chronic kidney disease    left kidney small    Coronary artery disease    Diabetes mellitus, type 2    ORAL AND INSULIN MEDS (LAST A1C  7.3)   Diverticulosis    Erythema CURRENT-- CLOSED ABD. WALL ABSCESS   PER PCP NOTE (03-31-11)- MRSA-- TAKES DOXYCYCLINE   GERD (gastroesophageal reflux disease)    CONTROLLED W/ PREVACID   History of kidney stones 2012   Hyperlipidemia    Hypertension    Insomnia    TAKES MEDS PRN   Neuropathy, peripheral    Obesity (BMI 30-39.9) 02/19/2015   Pneumonia    as child   Post op infection 11/07/2012   left bunionectomy   Restless leg syndrome    Sepsis, urinary HISTORY - 2004   Sleep apnea    no cpap    Thyroid disease    TIA (transient ischemic attack)    "mini strokes"  2023   Tremor    Vitamin D deficiency    Past Surgical History:  Procedure Laterality Date   ABDOMINAL HYSTERECTOMY  1995   ovaries remain   BUNIONECTOMY Left 10/24/2012   BUNIONECTOMY Right 05/17/2017   CARDIAC CATHETERIZATION  07/10/2004   CARPAL TUNNEL RELEASE  2000   RIGHT   CATARACT EXTRACTION, BILATERAL     CERVICAL FUSION  10/12/2007   C5 - 7   COLONOSCOPY     CYSTOSCOPY WITH RETROGRADE PYELOGRAM, URETEROSCOPY AND STENT PLACEMENT Left 11/28/2012   Procedure: LEFT RETROGRADE PYELOGRAM, LEFT URETEROSCOPY, ;  Surgeon: Garnett Farm, MD;  Location: [facility];  Service: Urology;  Laterality: Left;   CYSTOSCOPY/RETROGRADE/URETEROSCOPY/STONE EXTRACTION WITH BASKET  X2 2004 & 2009   LEFT    GASTRIC BYPASS  1981   KNEE ARTHROSCOPY  12/2010   LEFT   LAPAROSCOPIC CHOLECYSTECTOMY  2001   LEFT HEART CATH AND CORONARY ANGIOGRAPHY N/A 12/03/2021   Procedure: LEFT HEART CATH AND CORONARY ANGIOGRAPHY;  Surgeon: Corky Crafts, MD;  Location: [facility] INVASIVE CV LAB;  Service: Cardiovascular;  Laterality:  N/A;   left thumb joint surgery  2013   PERCUTANEOUS NEPHROSTOLITHOTOMY  02/27/2011   LEFT   POLYPECTOMY     REVERSE SHOULDER ARTHROPLASTY Right 07/12/2022   Procedure: REVERSE SHOULDER ARTHROPLASTY;  Surgeon: Huel Cote, MD;  Location: [facility] OR;  Service: Orthopedics;  Laterality: Right;   RIGHT THUMB JOINT SURG.  09/2009   TRIGGER FINGER RELEASE  2010   RIGHT THUMB   UPPER GASTROINTESTINAL ENDOSCOPY     URETERAL STENT PLACEMENT  X2  2004  &  2009   LEFT   URETEROSCOPY  04/06/2011   Procedure: URETEROSCOPY;  Surgeon: Garnett Farm, MD;  Location: [facility];  Service: Urology;  Laterality: Left;  L Ureteroscopy Laser Litho & Stent    Patient Active Problem List   Diagnosis Date Noted   Anxiety 08/19/2022   Closed 3-part fracture of proximal end of right humerus 07/11/2022   Shoulder dislocation 07/11/2022   Abnormal stress test    Atypical chest pain 08/05/2021    Bradycardia 06/20/2021   Abnormal gait 03/17/2021   Acquired deformity of lower leg 03/17/2021   Urinary urgency 11/14/2020   Paresthesia 08/15/2020   Right lumbar pain 08/15/2020   Tremor 08/15/2020   Hypoglycemia associated with diabetes 02/29/2020   Essential (primary) hypertension 08/10/2019   Chronic neck pain 06/23/2016   Radicular low back pain 07/12/2015   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 without macular edema 04/24/2015   Severe obesity (BMI >= 40) 02/19/2015   OSA (obstructive sleep apnea) 10/23/2014   Excessive daytime sleepiness 10/23/2014   Hypothyroidism 09/07/2014   Tobacco abuse 01/15/2014   Recurrent UTI 09/06/2013   Orthostatic hypotension 10/05/2012   General medical examination 06/22/2011   Atrial tachycardia 11/25/2010   Pre-operative cardiovascular examination 11/25/2010   Chronic diarrhea 08/25/2010   KNEE PAIN 07/14/2010   Knee pain 07/14/2010   DIASTOLIC HEART FAILURE, CHRONIC 06/09/2010   PERSONAL HISTORY OF COLONIC POLYPS 05/01/2010   Vitamin D deficiency 01/20/2010   RESTLESS LEG SYNDROME 01/20/2010   Insomnia 01/20/2010   Restless leg syndrome 01/20/2010   Hyperlipidemia associated with type 2 diabetes mellitus 01/08/2009   Anemia 01/08/2009   GERD 01/08/2009   Benign hypertensive heart disease with heart failure 01/08/2009    PCP: Neena Rhymes MD   REFERRING PROVIDER: Huel Cote, MD  REFERRING DIAG: [phone] (ICD-10-CM) - Status post reverse arthroplasty of right shoulder  THERAPY DIAG:  Acute pain of right shoulder  Stiffness of right shoulder, not elsewhere classified  Muscle weakness (generalized)  Abnormal posture  Rationale for Evaluation and Treatment: Rehabilitation  ONSET DATE: Date of Surgery: 07/12/22  SUBJECTIVE:  SUBJECTIVE STATEMENT: [name] reports consistent 3x/Day HEP compliance.  We discussed the importance of Codman's 5x/Day and other exercises 3x/Day.   Hand dominance: Right  PERTINENT HISTORY: 72 year old female is 2 weeks status post right shoulder reverse shoulder arthroplasty overall doing very well. Because this is a reverse for fracture I will asked that she remain in the sling for an additional 2 weeks. She may come out of this for passive range of motion at this time 1 month postop. That time she will begin following the reverse for fracture protocol. Will plan on outpatient physical therapy.  PAIN:  Are you having pain? Yes: NPRS scale: 3-5/10 this week  Pain location: Right shoulder and arm  Pain description: Sore and weak Aggravating factors: Prolonged postures and right shoulder use Relieving factors: Ice   PRECAUTIONS: Other: reverse total shoulder for fracture protocol   WEIGHT BEARING RESTRICTIONS: Yes NWB surgical LE   FALLS:  Has patient fallen in last 6 months? Yes. Number of falls 2- one fall on ramp in the rain that led to fracture/surgery, other fall was the night she came home from the hospital and just passed out; FOF (+)  LIVING ENVIRONMENT: Lives with: lives with their spouse Lives in: House/apartment Stairs: No Has following equipment at home: Single point cane, Environmental consultant - 2 wheeled, Crutches, Wheelchair (manual), bed side commode, and Ramped entry + walk-in shower, shower bench   OCCUPATION: Retired   PLOF: Independent, Independent with basic ADLs, Independent with gait, and Independent with transfers  PATIENT GOALS: move my arm, be able to wash dishes, brush teeth R handed- generally improve level of function    OBJECTIVE:   PATIENT SURVEYS:  EVAL: FOTO 32, predicted 57  COGNITION: Overall cognitive status: Within functional limits for tasks assessed and very anxious about therapy      SENSATION: Reports numbness  and burning in upper arm since surgery   POSTURE: Rounded shoulders, forward head, increased thoracic kyphosis   UPPER EXTREMITY ROM:   Passive ROM Right eval Right 08/11/22 Right 08/13/2022 Right 08/17/22 Right 08/21/2022  Shoulder flexion 45-50* P: 87 100 P: 110 Passive 110  Shoulder extension       Shoulder scaption <20*      Shoulder abduction  P: 78  P: 110   Shoulder internal rotation   60  65  Shoulder external rotation -10* to -15* (edited 08/06/22, noted that I put ER value in IR column KEU) P: 20 30  35  Elbow flexion About 75% limited PROM, very guarded       Elbow extension WNL PROM but very guarded       (Blank rows = not tested)  UPPER EXTREMITY MMT:  MMT Right eval Left eval  Shoulder flexion    Shoulder extension    Shoulder abduction    Shoulder adduction    Shoulder internal rotation    Shoulder external rotation    Middle trapezius    Lower trapezius    Elbow flexion    Elbow extension    Wrist flexion    Wrist extension    Wrist ulnar deviation    Wrist radial deviation    Wrist pronation    Wrist supination    Grip strength (lbs)    (Blank rows = not tested)- DNT at eval due to limitations in protocol    TODAY'S TREATMENT:  DATE:  08/21/2022 Standing pendulums within comfortable range: side-to-side; forward and back and circles clockwise and counterclockwise 30 times each.  Try to get her as close to parallel to the floor as possible to do these.  Active assisted supine arm raises (scapular protraction with palm facing in and left side assisting) 2 sets of 10 for 3 seconds  Shoulder blade pinches 10 for 5 seconds  Pulley flexion with AAROM (PT assist) protraction (palm facing in) 10 reps 10 seconds  Manual PROM right shoulder flexion, IR and ER to 35 degrees  Ice right shoulder 5 minutes post  exercises   08/18/22  TherEx  PROM/stretching flexion, scaption, ER to tolerance/within limits of protocol- went slow and easy, very guarded and pain limited today but able to reach 110* in flexion and scaption, 30* ER with arm at side Therapist assisted AAROM supine- flexion (to 90* today), scaption (to 110*) , ER with in limits of protocol    08/17/22 TherEx Pulleys x 5 min flexion Scapular retraction 2x10; 5 sec hold sitting AA standing wall slides x 10 reps; flexion and scaption Supine AA flexion and abduction x 15-20 reps with PT providing assist Supine AA circles x 5 reps each direction  Manual PROM Rt shoulder to tolerance ER, flexion and scaption PROM measurements - see above   PATIENT EDUCATION: Education details: exam findings, POC, HEP; extensive education on typical recovery after shoulder surgery, also benefit of taking pain medicine prior to PT to make skilled interventions more tolerable  Person educated: Patient Education method: Explanation, Demonstration, and Handouts Education comprehension: verbalized understanding, returned demonstration, and needs further education  HOME EXERCISE PROGRAM: Access Code: [email or web address] URL: [email or web address] Date: 08/07/2022 Prepared by: Pauletta Browns  Exercises - Seated Shoulder Pendulum Exercise  - 3-5 x daily - 7 x weekly - 1 sets - 20 reps - Gentle Horizontal Shoulder Pendulum  - 3-5 x daily - 7 x weekly - 1 sets - 20 reps - Gentle Vertical Shoulder Pendulum  - 3-5 x daily - 7 x weekly - 1 sets - 20 reps - Standing Scapular Retraction  - 5 x daily - 7 x weekly - 1 sets - 5 reps - 5 second hold - Supine Scapular Protraction in Flexion with Dumbbells  - 2-3 x daily - 7 x weekly - 1 sets - 10-20 reps - 3 seconds hold  ASSESSMENT:  CLINICAL IMPRESSION: Lamaria is making objective progress with her passive range of motion.  She is moving her right arm more freely as compared to the last time I saw her, although  it still requires active assistance from the uninvolved left side.  I encouraged Divinity to continue her current focus on her scapular strengthening (shoulder blade pinches and supine active assistive arm raises) and Codman's exercises at home to allow adequate range and proximal stability to progress into more reaching, overhead and functional activities as her protocol allows.  OBJECTIVE IMPAIRMENTS: decreased ROM, decreased strength, hypomobility, increased edema, increased fascial restrictions, increased muscle spasms, impaired flexibility, impaired UE functional use, postural dysfunction, obesity, and pain.   ACTIVITY LIMITATIONS: carrying, lifting, bed mobility, bathing, toileting, dressing, reach over head, and caring for others  PARTICIPATION LIMITATIONS: cleaning, laundry, driving, shopping, community activity, and yard work  PERSONAL FACTORS: Age, Behavior pattern, Education, Fitness, Past/current experiences, Sex, Social background, and Time since onset of injury/illness/exacerbation are also affecting patient's functional outcome.   REHAB POTENTIAL: Good  CLINICAL DECISION MAKING: Stable/uncomplicated  EVALUATION COMPLEXITY: Low   GOALS: Goals reviewed with  patient? Yes  SHORT TERM GOALS: Target date: 09/02/2022   Will be compliant with appropriate progressive HEP  Baseline: Goal status: Met 08/21/2022  2.  R shoulder flexion and scaption PROM to be at least 100 degrees, R shoulder ER PROM to be at least 20 degrees at 0* ABD  Baseline:  Goal status: Met 08/13/2022  3.  R shoulder flexion and scaption AAROM to be at least 90 degrees, R shoulder ER AAROM to reach neutral  Baseline:  Goal status: On Going 08/21/2022  4.  Will demonstrate better functional posture/postural mechanics  Baseline:  Goal status: On Going 08/21/2022   LONG TERM GOALS: Target date: 09/30/2022    R shoulder flexion and scaption AROM to be at least 120 degrees  Baseline:  Goal status: On Going  08/21/2022  2.  R shoulder ER AROM to be at least 30 degrees at 45* ABD  Baseline:  Goal status: On Going 08/21/2022  3.  Will be able to reach L5 level with active FIR no increased pain  Baseline:  Goal status: INITIAL  4.  Will be able to perform all dressing/bathing tasks on a Mod(I) basis no increase in pain  Baseline:  Goal status: INITIAL  5.  Will be able to reach overhead to put dishes in the cabinet without increase in pain  Baseline:  Goal status: INITIAL   PLAN:  PT FREQUENCY: 3x/week  PT DURATION: 8 weeks  PLANNED INTERVENTIONS: Therapeutic exercises, Therapeutic activity, Neuromuscular re-education, Balance training, Gait training, Patient/Family education, Self Care, Joint mobilization, Aquatic Therapy, Dry Needling, Electrical stimulation, Cryotherapy, Moist heat, Taping, Ultrasound, Ionotophoresis 4mg /ml Dexamethasone, Manual therapy, and Re-evaluation  PLAN FOR NEXT SESSION:  PROM, AAROM and appropriate AROM for flexion, IR and limited range ER.  Early phase scapular strength/functional work (without external resistance).  Progress per Dr. Serena Croissant protocol.  NEXT MD VISIT: 09/11/22  Cherlyn Cushing PT, MPT

## 2022-08-23 NOTE — Assessment & Plan Note (Signed)
Deteriorated w/ recent fall and shoulder surgery.  Trazodone  nightly.  Will add Sertraline to improve anxiety.  Pt expressed understanding and is in agreement w/ plan.

## 2022-08-23 NOTE — Assessment & Plan Note (Signed)
New.  Pt is having an understandably hard time dealing w/ her husband's ongoing illness and now her limitations.  She is in pain and not sleeping, she wants to continue to do all the things she was doing around the house to help her husband, but is finding she is not able.  She does have good family support locally.  She is open to idea of starting low dose SSRI to help take the edge off.  Will start Sertraline  daily and monitor closely for improvement.  Will add Trazodone for sleep.  Pt expressed understanding and is in agreement w/ plan.

## 2022-08-23 NOTE — Assessment & Plan Note (Signed)
New.  Pt fell and had complicated fracture dislocation of R shoulder.  S/p reverse shoulder arthroplasty.  Currently pain is not well controlled.  Will provide additional Oxycodone for pt to use prn.

## 2022-08-24 ENCOUNTER — Other Ambulatory Visit: Payer: Self-pay | Admitting: Family Medicine

## 2022-08-24 MED ORDER — OXYCODONE HCL 10 MG PO TABS: 10.0000 mg | ORAL_TABLET | Freq: Four times a day (QID) | ORAL | 0 refills | Status: DC | PRN

## 2022-08-24 NOTE — Telephone Encounter (Signed)
Patient is requesting a refill of the following medications: Requested Prescriptions   Pending Prescriptions Disp Refills   Oxycodone HCl 10 MG TABS 28 tablet 0    Sig: Take 1 tablet (10 mg total) by mouth every 6 (six) hours as needed.    Date of patient request: 08/24/2022 Last office visit: 08/19/2022 Date of last refill: 08/05/2022 Last refill amount: 28 Follow up time period per chart: 6 weeks

## 2022-08-25 ENCOUNTER — Ambulatory Visit: Payer: Medicare HMO | Admitting: Physical Therapy

## 2022-08-25 ENCOUNTER — Encounter: Payer: Self-pay | Admitting: Physical Therapy

## 2022-08-25 DIAGNOSIS — M6281 Muscle weakness (generalized): Secondary | ICD-10-CM

## 2022-08-25 DIAGNOSIS — M25511 Pain in right shoulder: Secondary | ICD-10-CM

## 2022-08-25 DIAGNOSIS — M25611 Stiffness of right shoulder, not elsewhere classified: Secondary | ICD-10-CM

## 2022-08-25 DIAGNOSIS — R293 Abnormal posture: Secondary | ICD-10-CM

## 2022-08-25 NOTE — Therapy (Signed)
OUTPATIENT PHYSICAL THERAPY SHOULDER TREATMENT PROGRESS NOTE  Reporting Period 08/05/22 to 08/25/22  See note below for Objective Data and Assessment of Progress/Goals.   Patient Name: Lacey [name] MRN: [id number] DOB:1950-07-30, 72 y.o., female Today's Date: 08/25/2022  END OF SESSION:  PT End of Session - 08/25/22 1012     Visit Number 10    Number of Visits 25    Date for PT Re-Evaluation 09/30/22    Authorization Type [employer] [facility]    Authorization Time Period 08/05/22 to 09/30/22    Progress Note Due on Visit 20    PT Start Time 1015    PT Stop Time 1055    PT Time Calculation (min) 40 min    Activity Tolerance Patient tolerated treatment well;No increased pain;Patient limited by pain    Behavior During Therapy [facility] for tasks assessed/performed                Past Medical History:  Diagnosis Date   Adenomatous colon polyp    hyperplastic   Anemia    Anginal pain    not recent   Arthritis    NECK, KNEES, FINGERS, TOES   Atrial tachycardia CARDIOLOGIST - DR Graciela Husbands (LAST VISIT AUG 2012)   Echo 12/11: EF 55-60%, Mild LVH, grade 1 diast dysfxn;   holter 1/12: ATach   Atypical chest pain    a. 07/2004 Cath: Clean cors;  b. 04/2010 Myoview: EF 63%, no ischemia   Blood transfusion without reported diagnosis 1969   CHF (congestive heart failure)    Chronic kidney disease    left kidney small    Coronary artery disease    Diabetes mellitus, type 2    ORAL AND INSULIN MEDS (LAST A1C  7.3)   Diverticulosis    Erythema CURRENT-- CLOSED ABD. WALL ABSCESS   PER PCP NOTE (03-31-11)- MRSA-- TAKES DOXYCYCLINE   GERD (gastroesophageal reflux disease)    CONTROLLED W/ PREVACID   History of kidney stones 2012   Hyperlipidemia    Hypertension    Insomnia    TAKES MEDS PRN   Neuropathy, peripheral    Obesity (BMI 30-39.9) 02/19/2015   Pneumonia    as child   Post op infection 11/07/2012   left bunionectomy   Restless leg syndrome    Sepsis, urinary HISTORY - 2004   Sleep  apnea    no cpap    Thyroid disease    TIA (transient ischemic attack)    "mini strokes" 2023   Tremor    Vitamin D deficiency    Past Surgical History:  Procedure Laterality Date   ABDOMINAL HYSTERECTOMY  1995   ovaries remain   BUNIONECTOMY Left 10/24/2012   BUNIONECTOMY Right 05/17/2017   CARDIAC CATHETERIZATION  07/10/2004   CARPAL TUNNEL RELEASE  2000   RIGHT   CATARACT EXTRACTION, BILATERAL     CERVICAL FUSION  10/12/2007   C5 - 7   COLONOSCOPY     CYSTOSCOPY WITH RETROGRADE PYELOGRAM, URETEROSCOPY AND STENT PLACEMENT Left 11/28/2012   Procedure: LEFT RETROGRADE PYELOGRAM, LEFT URETEROSCOPY, ;  Surgeon: Garnett Farm, MD;  Location: [facility];  Service: Urology;  Laterality: Left;   CYSTOSCOPY/RETROGRADE/URETEROSCOPY/STONE EXTRACTION WITH BASKET  X2 2004 & 2009   LEFT    GASTRIC BYPASS  1981   KNEE ARTHROSCOPY  12/2010   LEFT   LAPAROSCOPIC CHOLECYSTECTOMY  2001   LEFT HEART CATH AND CORONARY ANGIOGRAPHY N/A 12/03/2021   Procedure: LEFT HEART CATH AND CORONARY ANGIOGRAPHY;  Surgeon: Corky Crafts,  MD;  Location: [facility];  Service: Cardiovascular;  Laterality: N/A;   left thumb joint surgery  2013   PERCUTANEOUS NEPHROSTOLITHOTOMY  02/27/2011   LEFT   POLYPECTOMY     REVERSE SHOULDER ARTHROPLASTY Right 07/12/2022   Procedure: REVERSE SHOULDER ARTHROPLASTY;  Surgeon: Huel Cote, MD;  Location: [facility];  Service: Orthopedics;  Laterality: Right;   RIGHT THUMB JOINT SURG.  09/2009   TRIGGER FINGER RELEASE  2010   RIGHT THUMB   UPPER GASTROINTESTINAL ENDOSCOPY     URETERAL STENT PLACEMENT  X2  2004  &  2009   LEFT   URETEROSCOPY  04/06/2011   Procedure: URETEROSCOPY;  Surgeon: Garnett Farm, MD;  Location: [facility];  Service: Urology;  Laterality: Left;  L Ureteroscopy Laser Litho & Stent    Patient Active Problem List   Diagnosis Date Noted   Anxiety 08/19/2022   Closed 3-part fracture of proximal end of right humerus 07/11/2022    Shoulder dislocation 07/11/2022   Abnormal stress test    Atypical chest pain 08/05/2021   Bradycardia 06/20/2021   Abnormal gait 03/17/2021   Acquired deformity of lower leg 03/17/2021   Urinary urgency 11/14/2020   Paresthesia 08/15/2020   Right lumbar pain 08/15/2020   Tremor 08/15/2020   Hypoglycemia associated with diabetes 02/29/2020   Essential (primary) hypertension 08/10/2019   Chronic neck pain 06/23/2016   Radicular low back pain 07/12/2015   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 without macular edema 04/24/2015   Severe obesity (BMI >= 40) 02/19/2015   OSA (obstructive sleep apnea) 10/23/2014   Excessive daytime sleepiness 10/23/2014   Hypothyroidism 09/07/2014   Tobacco abuse 01/15/2014   Recurrent UTI 09/06/2013   Orthostatic hypotension 10/05/2012   General medical examination 06/22/2011   Atrial tachycardia 11/25/2010   Pre-operative cardiovascular examination 11/25/2010   Chronic diarrhea 08/25/2010   KNEE PAIN 07/14/2010   Knee pain 07/14/2010   DIASTOLIC HEART FAILURE, CHRONIC 06/09/2010   PERSONAL HISTORY OF COLONIC POLYPS 05/01/2010   Vitamin D deficiency 01/20/2010   RESTLESS LEG SYNDROME 01/20/2010   Insomnia 01/20/2010   Restless leg syndrome 01/20/2010   Hyperlipidemia associated with type 2 diabetes mellitus 01/08/2009   Anemia 01/08/2009   GERD 01/08/2009   Benign hypertensive heart disease with heart failure 01/08/2009    PCP: Neena Rhymes MD   REFERRING PROVIDER: Huel Cote, MD  REFERRING DIAG: [phone] (ICD-10-CM) - Status post reverse arthroplasty of right shoulder  THERAPY DIAG:  Acute pain of right shoulder  Stiffness of right shoulder, not elsewhere classified  Muscle weakness (generalized)  Abnormal posture  Rationale for Evaluation and Treatment: Rehabilitation  ONSET DATE: Date of Surgery: 07/12/22  SUBJECTIVE:  SUBJECTIVE STATEMENT: Reports the past 2 times she's done pulleys her shoulder has been painful the next day.  Hand dominance: Right  PERTINENT HISTORY: 72 year old female is 2 weeks status post right shoulder reverse shoulder arthroplasty overall doing very well. Because this is a reverse for fracture I will asked that she remain in the sling for an additional 2 weeks. She may come out of this for passive range of motion at this time 1 month postop. That time she will begin following the reverse for fracture protocol. Will plan on outpatient physical therapy.  PAIN:  Are you having pain? Yes: NPRS scale: 4/10 this week  Pain location: Right shoulder and arm  Pain description: Sore and weak Aggravating factors: Prolonged postures and right shoulder use Relieving factors: Ice   PRECAUTIONS: Other: reverse total shoulder for fracture protocol   WEIGHT BEARING RESTRICTIONS: Yes NWB surgical LE   FALLS:  Has patient fallen in last 6 months? Yes. Number of falls 2- one fall on ramp in the rain that led to fracture/surgery, other fall was the night she came home from the hospital and just passed out; FOF (+)  LIVING ENVIRONMENT: Lives with: lives with their spouse Lives in: House/apartment Stairs: No Has following equipment at home: Single point cane, Environmental consultant - 2 wheeled, Crutches, Wheelchair (manual), bed side commode, and Ramped entry + walk-in shower, shower bench   OCCUPATION: Retired   PLOF: Independent, Independent with basic ADLs, Independent with gait, and Independent with transfers  PATIENT GOALS: move my arm, be able to wash dishes, brush teeth R handed- generally improve level of function    OBJECTIVE:   PATIENT SURVEYS:  EVAL: FOTO 32, predicted 57 08/25/22: FOTO 49  COGNITION: Overall cognitive status: Within functional limits for tasks  assessed and very anxious about therapy      SENSATION: Reports numbness and burning in upper arm since surgery   POSTURE: Rounded shoulders, forward head, increased thoracic kyphosis   UPPER EXTREMITY ROM:   Passive ROM Right eval Right 08/11/22 Right 08/13/2022 Right 08/17/22 Right 08/21/2022 Right 08/25/22  Shoulder flexion 45-50* P: 87 100 P: 110 Passive 110 AA: 136  Shoulder extension        Shoulder scaption <20*       Shoulder abduction  P: 78  P: 110  AA: 143 (Scaption)  Shoulder internal rotation   60  65   Shoulder external rotation -10* to -15* (edited 08/06/22, noted that I put ER value in IR column KEU) P: 20 30  35 AA: 35  Elbow flexion About 75% limited PROM, very guarded        Elbow extension WNL PROM but very guarded        (Blank rows = not tested)  UPPER EXTREMITY MMT:  MMT Right eval Left eval  Shoulder flexion    Shoulder extension    Shoulder abduction    Shoulder adduction    Shoulder internal rotation    Shoulder external rotation    Middle trapezius    Lower trapezius    Elbow flexion    Elbow extension    Wrist flexion    Wrist extension    Wrist ulnar deviation    Wrist radial deviation    Wrist pronation    Wrist supination    Grip strength (lbs)    (Blank rows = not tested)- DNT at eval due to limitations in protocol    TODAY'S TREATMENT:  DATE:  08/25/22 TherEx Scapular retraction x20 reps; seated AAROM measurements - see above for details Supine chest press 2 x 5 reps; PT assisting first set with decreasing assist and only needing eccentric assist on 2nd set Standing with Rt hand on counter and step back for PROM exercise - demonstrated for home  08/21/2022 Standing pendulums within comfortable range: side-to-side; forward and back and circles clockwise and counterclockwise 30 times each.  Try to get  her as close to parallel to the floor as possible to do these.  Active assisted supine arm raises (scapular protraction with palm facing in and left side assisting) 2 sets of 10 for 3 seconds  Shoulder blade pinches 10 for 5 seconds  Pulley flexion with AAROM (PT assist) protraction (palm facing in) 10 reps 10 seconds  Manual PROM right shoulder flexion, IR and ER to 35 degrees  Ice right shoulder 5 minutes post exercises   08/18/22  TherEx  PROM/stretching flexion, scaption, ER to tolerance/within limits of protocol- went slow and easy, very guarded and pain limited today but able to reach 110* in flexion and scaption, 30* ER with arm at side Therapist assisted AAROM supine- flexion (to 90* today), scaption (to 110*) , ER with in limits of protocol    08/17/22 TherEx Pulleys x 5 min flexion Scapular retraction 2x10; 5 sec hold sitting AA standing wall slides x 10 reps; flexion and scaption Supine AA flexion and abduction x 15-20 reps with PT providing assist Supine AA circles x 5 reps each direction  Manual PROM Rt shoulder to tolerance ER, flexion and scaption PROM measurements - see above   PATIENT EDUCATION: Education details: exam findings, POC, HEP; extensive education on typical recovery after shoulder surgery, also benefit of taking pain medicine prior to PT to make skilled interventions more tolerable  Person educated: Patient Education method: Explanation, Demonstration, and Handouts Education comprehension: verbalized understanding, returned demonstration, and needs further education  HOME EXERCISE PROGRAM: Access Code: [email or web address] URL: [email or web address] Date: 08/07/2022 Prepared by: Pauletta Browns  Exercises - Seated Shoulder Pendulum Exercise  - 3-5 x daily - 7 x weekly - 1 sets - 20 reps - Gentle Horizontal Shoulder Pendulum  - 3-5 x daily - 7 x weekly - 1 sets - 20 reps - Gentle Vertical Shoulder Pendulum  - 3-5 x daily - 7 x weekly - 1  sets - 20 reps - Standing Scapular Retraction  - 5 x daily - 7 x weekly - 1 sets - 5 reps - 5 second hold - Supine Scapular Protraction in Flexion with Dumbbells  - 2-3 x daily - 7 x weekly - 1 sets - 10-20 reps - 3 seconds hold  ASSESSMENT:  CLINICAL IMPRESSION: Pt tolerated session well today with continued work on [employer].  She has met STG #3 today and slowly demonstrating progress.  Deferred pulleys today as she's had increased pain following this activity.  Will continue to benefit from PT to maximize function.  OBJECTIVE IMPAIRMENTS: decreased ROM, decreased strength, hypomobility, increased edema, increased fascial restrictions, increased muscle spasms, impaired flexibility, impaired UE functional use, postural dysfunction, obesity, and pain.   ACTIVITY LIMITATIONS: carrying, lifting, bed mobility, bathing, toileting, dressing, reach over head, and caring for others  PARTICIPATION LIMITATIONS: cleaning, laundry, driving, shopping, community activity, and yard work  PERSONAL FACTORS: Age, Behavior pattern, Education, Fitness, Past/current experiences, Sex, Social background, and Time since onset of injury/illness/exacerbation are also affecting patient's functional outcome.   REHAB POTENTIAL: Good  CLINICAL DECISION MAKING: Stable/uncomplicated  EVALUATION COMPLEXITY: Low   GOALS: Goals reviewed with patient? Yes  SHORT TERM GOALS: Target date: 09/02/2022   Will be compliant with appropriate progressive HEP  Baseline: Goal status: Met 08/21/2022  2.  R shoulder flexion and scaption PROM to be at least 100 degrees, R shoulder ER PROM to be at least 20 degrees at 0* ABD  Baseline:  Goal status: Met 08/13/2022  3.  R shoulder flexion and scaption AAROM to be at least 90 degrees, R shoulder ER AAROM to reach neutral  Baseline:  Goal status: MET 08/25/2022   4.  Will demonstrate better functional posture/postural mechanics  Baseline:  Goal status: On Going 08/25/2022   LONG  TERM GOALS: Target date: 09/30/2022    R shoulder flexion and scaption AROM to be at least 120 degrees  Baseline:  Goal status: On Going 08/25/2022  2.  R shoulder ER AROM to be at least 30 degrees at 45* ABD  Baseline:  Goal status: On Going 08/25/2022  3.  Will be able to reach L5 level with active FIR no increased pain  Baseline:  Goal status: Ongoing 08/25/22  4.  Will be able to perform all dressing/bathing tasks on a Mod(I) basis no increase in pain  Baseline:  Goal status: Ongoing 08/25/22  5.  Will be able to reach overhead to put dishes in the cabinet without increase in pain  Baseline:  Goal status: Ongoing 08/25/22   PLAN:  PT FREQUENCY: 3x/week  PT DURATION: 8 weeks  PLANNED INTERVENTIONS: Therapeutic exercises, Therapeutic activity, Neuromuscular re-education, Balance training, Gait training, Patient/Family education, Self Care, Joint mobilization, Aquatic Therapy, Dry Needling, Electrical stimulation, Cryotherapy, Moist heat, Taping, Ultrasound, Ionotophoresis /ml Dexamethasone, Manual therapy, and Re-evaluation  PLAN FOR NEXT SESSION:  PROM, AAROM and appropriate AROM for flexion, IR and limited range ER.  Early phase scapular strength/functional work (without external resistance).  Progress per Dr. Serena Croissant protocol.  Hold on pulleys for now due to pain.  NEXT MD VISIT: 09/11/22   Clarita Crane, PT, DPT 08/25/22 11:12 AM

## 2022-08-26 ENCOUNTER — Encounter: Payer: Self-pay | Admitting: Rehabilitative and Restorative Service Providers"

## 2022-08-26 ENCOUNTER — Ambulatory Visit: Payer: Medicare HMO | Admitting: Rehabilitative and Restorative Service Providers"

## 2022-08-26 DIAGNOSIS — R293 Abnormal posture: Secondary | ICD-10-CM

## 2022-08-26 DIAGNOSIS — M25611 Stiffness of right shoulder, not elsewhere classified: Secondary | ICD-10-CM | POA: Diagnosis not present

## 2022-08-26 DIAGNOSIS — M25511 Pain in right shoulder: Secondary | ICD-10-CM

## 2022-08-26 DIAGNOSIS — M6281 Muscle weakness (generalized): Secondary | ICD-10-CM | POA: Diagnosis not present

## 2022-08-26 NOTE — Therapy (Signed)
OUTPATIENT PHYSICAL THERAPY SHOULDER TREATMENT NOTE  Patient Name: Lacey [name] MRN: [id number] DOB:1951-02-23, 72 y.o., female Today's Date: 08/26/2022  END OF SESSION:  PT End of Session - 08/26/22 1721     Visit Number 11    Number of Visits 25    Date for PT Re-Evaluation 09/30/22    Authorization Type [employer] MCR    Authorization Time Period 08/05/22 to 09/30/22    Authorization - Visit Number 11    Progress Note Due on Visit 20    PT Start Time 1345    PT Stop Time 1430    PT Time Calculation (min) 45 min    Activity Tolerance Patient tolerated treatment well;No increased pain;Patient limited by pain    Behavior During Therapy [facility] for tasks assessed/performed             Past Medical History:  Diagnosis Date   Adenomatous colon polyp    hyperplastic   Anemia    Anginal pain    not recent   Arthritis    NECK, KNEES, FINGERS, TOES   Atrial tachycardia CARDIOLOGIST - DR Graciela Husbands (LAST VISIT AUG 2012)   Echo 12/11: EF 55-60%, Mild LVH, grade 1 diast dysfxn;   holter 1/12: ATach   Atypical chest pain    a. 07/2004 Cath: Clean cors;  b. 04/2010 Myoview: EF 63%, no ischemia   Blood transfusion without reported diagnosis 1969   CHF (congestive heart failure)    Chronic kidney disease    left kidney small    Coronary artery disease    Diabetes mellitus, type 2    ORAL AND INSULIN MEDS (LAST A1C  7.3)   Diverticulosis    Erythema CURRENT-- CLOSED ABD. WALL ABSCESS   PER PCP NOTE (03-31-11)- MRSA-- TAKES DOXYCYCLINE   GERD (gastroesophageal reflux disease)    CONTROLLED W/ PREVACID   History of kidney stones 2012   Hyperlipidemia    Hypertension    Insomnia    TAKES MEDS PRN   Neuropathy, peripheral    Obesity (BMI 30-39.9) 02/19/2015   Pneumonia    as child   Post op infection 11/07/2012   left bunionectomy   Restless leg syndrome    Sepsis, urinary HISTORY - 2004   Sleep apnea    no cpap    Thyroid disease    TIA (transient ischemic attack)    "mini strokes"  2023   Tremor    Vitamin D deficiency    Past Surgical History:  Procedure Laterality Date   ABDOMINAL HYSTERECTOMY  1995   ovaries remain   BUNIONECTOMY Left 10/24/2012   BUNIONECTOMY Right 05/17/2017   CARDIAC CATHETERIZATION  07/10/2004   CARPAL TUNNEL RELEASE  2000   RIGHT   CATARACT EXTRACTION, BILATERAL     CERVICAL FUSION  10/12/2007   C5 - 7   COLONOSCOPY     CYSTOSCOPY WITH RETROGRADE PYELOGRAM, URETEROSCOPY AND STENT PLACEMENT Left 11/28/2012   Procedure: LEFT RETROGRADE PYELOGRAM, LEFT URETEROSCOPY, ;  Surgeon: Garnett Farm, MD;  Location: [facility];  Service: Urology;  Laterality: Left;   CYSTOSCOPY/RETROGRADE/URETEROSCOPY/STONE EXTRACTION WITH BASKET  X2 2004 & 2009   LEFT    GASTRIC BYPASS  1981   KNEE ARTHROSCOPY  12/2010   LEFT   LAPAROSCOPIC CHOLECYSTECTOMY  2001   LEFT HEART CATH AND CORONARY ANGIOGRAPHY N/A 12/03/2021   Procedure: LEFT HEART CATH AND CORONARY ANGIOGRAPHY;  Surgeon: Corky Crafts, MD;  Location: [facility] INVASIVE CV LAB;  Service: Cardiovascular;  Laterality: N/A;  left thumb joint surgery  2013   PERCUTANEOUS NEPHROSTOLITHOTOMY  02/27/2011   LEFT   POLYPECTOMY     REVERSE SHOULDER ARTHROPLASTY Right 07/12/2022   Procedure: REVERSE SHOULDER ARTHROPLASTY;  Surgeon: Huel Cote, MD;  Location: [facility];  Service: Orthopedics;  Laterality: Right;   RIGHT THUMB JOINT SURG.  09/2009   TRIGGER FINGER RELEASE  2010   RIGHT THUMB   UPPER GASTROINTESTINAL ENDOSCOPY     URETERAL STENT PLACEMENT  X2  2004  &  2009   LEFT   URETEROSCOPY  04/06/2011   Procedure: URETEROSCOPY;  Surgeon: Garnett Farm, MD;  Location: [facility];  Service: Urology;  Laterality: Left;  L Ureteroscopy Laser Litho & Stent    Patient Active Problem List   Diagnosis Date Noted   Anxiety 08/19/2022   Closed 3-part fracture of proximal end of right humerus 07/11/2022   Shoulder dislocation 07/11/2022   Abnormal stress test    Atypical chest pain 08/05/2021    Bradycardia 06/20/2021   Abnormal gait 03/17/2021   Acquired deformity of lower leg 03/17/2021   Urinary urgency 11/14/2020   Paresthesia 08/15/2020   Right lumbar pain 08/15/2020   Tremor 08/15/2020   Hypoglycemia associated with diabetes 02/29/2020   Essential (primary) hypertension 08/10/2019   Chronic neck pain 06/23/2016   Radicular low back pain 07/12/2015   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 without macular edema 04/24/2015   Severe obesity (BMI >= 40) 02/19/2015   OSA (obstructive sleep apnea) 10/23/2014   Excessive daytime sleepiness 10/23/2014   Hypothyroidism 09/07/2014   Tobacco abuse 01/15/2014   Recurrent UTI 09/06/2013   Orthostatic hypotension 10/05/2012   General medical examination 06/22/2011   Atrial tachycardia 11/25/2010   Pre-operative cardiovascular examination 11/25/2010   Chronic diarrhea 08/25/2010   KNEE PAIN 07/14/2010   Knee pain 07/14/2010   DIASTOLIC HEART FAILURE, CHRONIC 06/09/2010   PERSONAL HISTORY OF COLONIC POLYPS 05/01/2010   Vitamin D deficiency 01/20/2010   RESTLESS LEG SYNDROME 01/20/2010   Insomnia 01/20/2010   Restless leg syndrome 01/20/2010   Hyperlipidemia associated with type 2 diabetes mellitus 01/08/2009   Anemia 01/08/2009   GERD 01/08/2009   Benign hypertensive heart disease with heart failure 01/08/2009    PCP: Neena Rhymes MD   REFERRING PROVIDER: Huel Cote, MD  REFERRING DIAG: [phone] (ICD-10-CM) - Status post reverse arthroplasty of right shoulder  THERAPY DIAG:  Acute pain of right shoulder  Muscle weakness (generalized)  Stiffness of right shoulder, not elsewhere classified  Abnormal posture  Rationale for Evaluation and Treatment: Rehabilitation  ONSET DATE: Date of Surgery: 07/12/22  SUBJECTIVE:  SUBJECTIVE STATEMENT: Lacey [name] needs prescription pain medication at night.  She gets by with nothing (or tylenol) during the day.  Hand dominance: Right  PERTINENT HISTORY: 72 year old female is 2 weeks status post right shoulder reverse shoulder arthroplasty overall doing very well. Because this is a reverse for fracture I will asked that she remain in the sling for an additional 2 weeks. She may come out of this for passive range of motion at this time 1 month postop. That time she will begin following the reverse for fracture protocol. Will plan on outpatient physical therapy.  PAIN:  Are you having pain? Yes: NPRS scale: 2-5/10 this week  Pain location: Right shoulder and arm  Pain description: Sore and weak Aggravating factors: Prolonged postures and right shoulder use Relieving factors: Ice   PRECAUTIONS: Other: reverse total shoulder for fracture protocol   WEIGHT BEARING RESTRICTIONS: Yes NWB surgical LE   FALLS:  Has patient fallen in last 6 months? Yes. Number of falls 2- one fall on ramp in the rain that led to fracture/surgery, other fall was the night she came home from the hospital and just passed out; FOF (+)  LIVING ENVIRONMENT: Lives with: lives with their spouse Lives in: House/apartment Stairs: No Has following equipment at home: Single point cane, Environmental consultant - 2 wheeled, Crutches, Wheelchair (manual), bed side commode, and Ramped entry + walk-in shower, shower bench   OCCUPATION: Retired   PLOF: Independent, Independent with basic ADLs, Independent with gait, and Independent with transfers  PATIENT GOALS: move my arm, be able to wash dishes, brush teeth R handed- generally improve level of function    OBJECTIVE:   PATIENT SURVEYS:  EVAL: FOTO 32, predicted 57 08/25/22: FOTO 49  COGNITION: Overall cognitive status: Within functional limits for tasks assessed and very anxious about therapy      SENSATION: Reports numbness  and burning in upper arm since surgery   POSTURE: Rounded shoulders, forward head, increased thoracic kyphosis   UPPER EXTREMITY ROM:   Passive ROM Right eval Right 08/11/22 Right 08/13/2022 Right 08/17/22 Right 08/21/2022 Right 08/25/22 Right 08/26/2022  Shoulder flexion 45-50* P: 87 100 P: 110 Passive 110 AA: 136 110  Shoulder extension         Shoulder scaption <20*        Shoulder abduction  P: 78  P: 110  AA: 143 (Scaption)   Shoulder internal rotation   60  65  65  Shoulder external rotation -10* to -15* (edited 08/06/22, noted that I put ER value in IR column KEU) P: 20 30  35 AA: 35 40  Elbow flexion About 75% limited PROM, very guarded         Elbow extension WNL PROM but very guarded         (Blank rows = not tested)  UPPER EXTREMITY MMT:  MMT Right eval Left eval  Shoulder flexion    Shoulder extension    Shoulder abduction    Shoulder adduction    Shoulder internal rotation    Shoulder external rotation    Middle trapezius    Lower trapezius    Elbow flexion    Elbow extension    Wrist flexion    Wrist extension    Wrist ulnar deviation    Wrist radial deviation    Wrist pronation    Wrist supination    Grip strength (lbs)    (Blank rows = not tested)- DNT at eval due to limitations in protocol    TODAY'S  TREATMENT:                                                                                                                                         DATE:  08/26/2022 Standing pendulums within comfortable range: side-to-side; forward and back and circles clockwise and counterclockwise 20 times each.  Try to get her as close to parallel to the floor as possible to do these.  Active assisted supine arm raises (scapular protraction with palm facing in and left side assisting) 3 sets of 10 for 3 seconds  Side lie ER (lie left, keep Rt elbow bent 90 degrees and rotate up as far as possible) 2 sets of 10 with PT assist  Shoulder blade pinches 10 for 5  seconds  Manual PROM right shoulder flexion, IR and ER to 40 degrees  Ice right shoulder 5 minutes post exercises   08/25/22 TherEx Scapular retraction x20 reps; seated AAROM measurements - see above for details Supine chest press 2 x 5 reps; PT assisting first set with decreasing assist and only needing eccentric assist on 2nd set Standing with Rt hand on counter and step back for PROM exercise - demonstrated for home   08/21/2022 Standing pendulums within comfortable range: side-to-side; forward and back and circles clockwise and counterclockwise 30 times each.  Try to get her as close to parallel to the floor as possible to do these.  Active assisted supine arm raises (scapular protraction with palm facing in and left side assisting) 2 sets of 10 for 3 seconds  Shoulder blade pinches 10 for 5 seconds  Pulley flexion with AAROM (PT assist) protraction (palm facing in) 10 reps 10 seconds  Manual PROM right shoulder flexion, IR and ER to 35 degrees  Ice right shoulder 5 minutes post exercises   PATIENT EDUCATION: Education details: exam findings, POC, HEP; extensive education on typical recovery after shoulder surgery, also benefit of taking pain medicine prior to PT to make skilled interventions more tolerable  Person educated: Patient Education method: Explanation, Demonstration, and Handouts Education comprehension: verbalized understanding, returned demonstration, and needs further education  HOME EXERCISE PROGRAM: Access Code: [email or web address] URL: [email or web address] Date: 08/26/2022 Prepared by: Pauletta Browns  Exercises - Seated Shoulder Pendulum Exercise  - 3-5 x daily - 7 x weekly - 1 sets - 20 reps - Gentle Horizontal Shoulder Pendulum  - 3-5 x daily - 7 x weekly - 1 sets - 20 reps - Gentle Vertical Shoulder Pendulum  - 3-5 x daily - 7 x weekly - 1 sets - 20 reps - Standing Scapular Retraction  - 5 x daily - 7 x weekly - 1 sets - 5 reps - 5 second hold -  Supine Scapular Protraction in Flexion with Dumbbells  - 2-3 x daily - 7 x weekly - 1 sets - 10-20 reps - 3 seconds hold - Sidelying Shoulder External Rotation Dumbbell  - 1-2 x  daily - 7 x weekly - 2 sets - 10 reps - 1 hold  ASSESSMENT:  CLINICAL IMPRESSION: Lacey [name] was able to complete active assisted side-lying external rotation with the physical therapist help.  She is doing a better job with her supine arm raises and is completing them with the involved side doing 80% of the work.  As she continues to get stronger with her scapular stabilizers, we will appropriately progress deltoid strengthening while still protecting the healing subscapularis and avoiding dislocation.  Continue appropriate stabilization strengthening activities without over stressing the healing subscapularis.  OBJECTIVE IMPAIRMENTS: decreased ROM, decreased strength, hypomobility, increased edema, increased fascial restrictions, increased muscle spasms, impaired flexibility, impaired UE functional use, postural dysfunction, obesity, and pain.   ACTIVITY LIMITATIONS: carrying, lifting, bed mobility, bathing, toileting, dressing, reach over head, and caring for others  PARTICIPATION LIMITATIONS: cleaning, laundry, driving, shopping, community activity, and yard work  PERSONAL FACTORS: Age, Behavior pattern, Education, Fitness, Past/current experiences, Sex, Social background, and Time since onset of injury/illness/exacerbation are also affecting patient's functional outcome.   REHAB POTENTIAL: Good  CLINICAL DECISION MAKING: Stable/uncomplicated  EVALUATION COMPLEXITY: Low   GOALS: Goals reviewed with patient? Yes  SHORT TERM GOALS: Target date: 09/02/2022   Will be compliant with appropriate progressive HEP  Baseline: Goal status: Met 08/21/2022  2.  R shoulder flexion and scaption PROM to be at least 100 degrees, R shoulder ER PROM to be at least 20 degrees at 0* ABD  Baseline:  Goal status: Met 08/13/2022  3.  R  shoulder flexion and scaption AAROM to be at least 90 degrees, R shoulder ER AAROM to reach neutral  Baseline:  Goal status: MET 08/25/2022   4.  Will demonstrate better functional posture/postural mechanics  Baseline:  Goal status: On Going 08/25/2022   LONG TERM GOALS: Target date: 09/30/2022    R shoulder flexion and scaption AROM to be at least 120 degrees  Baseline:  Goal status: On Going 08/25/2022  2.  R shoulder ER AROM to be at least 30 degrees at 45* ABD  Baseline:  Goal status: On Going 08/25/2022  3.  Will be able to reach L5 level with active FIR no increased pain  Baseline:  Goal status: Ongoing 08/25/22  4.  Will be able to perform all dressing/bathing tasks on a Mod(I) basis no increase in pain  Baseline:  Goal status: Ongoing 08/25/22  5.  Will be able to reach overhead to put dishes in the cabinet without increase in pain  Baseline:  Goal status: Ongoing 08/25/22   PLAN:  PT FREQUENCY: 3x/week  PT DURATION: 8 weeks  PLANNED INTERVENTIONS: Therapeutic exercises, Therapeutic activity, Neuromuscular re-education, Balance training, Gait training, Patient/Family education, Self Care, Joint mobilization, Aquatic Therapy, Dry Needling, Electrical stimulation, Cryotherapy, Moist heat, Taping, Ultrasound, Ionotophoresis /ml Dexamethasone, Manual therapy, and Re-evaluation  PLAN FOR NEXT SESSION:  PROM, AAROM and appropriate AROM for flexion, IR and limited range ER.  Early phase scapular strength/functional work (without external resistance).  Progress per Dr. Serena Croissant protocol.  Hold on pulleys for now due to pain.  NEXT MD VISIT: 09/11/22  Cherlyn Cushing PT, MPT 08/26/22 5:25 PM

## 2022-08-28 ENCOUNTER — Ambulatory Visit: Payer: Medicare HMO | Admitting: Physical Therapy

## 2022-08-28 ENCOUNTER — Encounter: Payer: Self-pay | Admitting: Physical Therapy

## 2022-08-28 DIAGNOSIS — M25611 Stiffness of right shoulder, not elsewhere classified: Secondary | ICD-10-CM

## 2022-08-28 DIAGNOSIS — M6281 Muscle weakness (generalized): Secondary | ICD-10-CM

## 2022-08-28 DIAGNOSIS — R293 Abnormal posture: Secondary | ICD-10-CM

## 2022-08-28 DIAGNOSIS — M25511 Pain in right shoulder: Secondary | ICD-10-CM

## 2022-08-28 NOTE — Therapy (Signed)
OUTPATIENT PHYSICAL THERAPY SHOULDER TREATMENT NOTE  Patient Name: Lacey [name] MRN: [id number] DOB:01/03/1951, 72 y.o., female Today's Date: 08/28/2022  END OF SESSION:  PT End of Session - 08/28/22 1017     Visit Number 12    Number of Visits 25    Date for PT Re-Evaluation 09/30/22    Authorization Type [employer] MCR    Authorization Time Period 08/05/22 to 09/30/22    Progress Note Due on Visit 20    PT Start Time 1015    PT Stop Time 1056    PT Time Calculation (min) 41 min    Activity Tolerance Patient tolerated treatment well    Behavior During Therapy WFL for tasks assessed/performed              Past Medical History:  Diagnosis Date   Adenomatous colon polyp    hyperplastic   Anemia    Anginal pain (HCC)    not recent   Arthritis    NECK, KNEES, FINGERS, TOES   Atrial tachycardia CARDIOLOGIST - DR Graciela Husbands (LAST VISIT AUG 2012)   Echo 12/11: EF 55-60%, Mild LVH, grade 1 diast dysfxn;   holter 1/12: ATach   Atypical chest pain    a. 07/2004 Cath: Clean cors;  b. 04/2010 Myoview: EF 63%, no ischemia   Blood transfusion without reported diagnosis 1969   CHF (congestive heart failure) (HCC)    Chronic kidney disease    left kidney small    Coronary artery disease    Diabetes mellitus, type 2 (HCC)    ORAL AND INSULIN MEDS (LAST A1C  7.3)   Diverticulosis    Erythema CURRENT-- CLOSED ABD. WALL ABSCESS   PER PCP NOTE (03-31-11)- MRSA-- TAKES DOXYCYCLINE   GERD (gastroesophageal reflux disease)    CONTROLLED W/ PREVACID   History of kidney stones 2012   Hyperlipidemia    Hypertension    Insomnia    TAKES MEDS PRN   Neuropathy, peripheral    Obesity (BMI 30-39.9) 02/19/2015   Pneumonia    as child   Post op infection 11/07/2012   left bunionectomy   Restless leg syndrome    Sepsis, urinary HISTORY - 2004   Sleep apnea    no cpap    Thyroid disease    TIA (transient ischemic attack)    "mini strokes" 2023   Tremor    Vitamin D deficiency    Past  Surgical History:  Procedure Laterality Date   ABDOMINAL HYSTERECTOMY  1995   ovaries remain   BUNIONECTOMY Left 10/24/2012   BUNIONECTOMY Right 05/17/2017   CARDIAC CATHETERIZATION  07/10/2004   CARPAL TUNNEL RELEASE  2000   RIGHT   CATARACT EXTRACTION, BILATERAL     CERVICAL FUSION  10/12/2007   C5 - 7   COLONOSCOPY     CYSTOSCOPY WITH RETROGRADE PYELOGRAM, URETEROSCOPY AND STENT PLACEMENT Left 11/28/2012   Procedure: LEFT RETROGRADE PYELOGRAM, LEFT URETEROSCOPY, ;  Surgeon: Garnett Farm, MD;  Location: [facility];  Service: Urology;  Laterality: Left;   CYSTOSCOPY/RETROGRADE/URETEROSCOPY/STONE EXTRACTION WITH BASKET  X2 2004 & 2009   LEFT    GASTRIC BYPASS  1981   KNEE ARTHROSCOPY  12/2010   LEFT   LAPAROSCOPIC CHOLECYSTECTOMY  2001   LEFT HEART CATH AND CORONARY ANGIOGRAPHY N/A 12/03/2021   Procedure: LEFT HEART CATH AND CORONARY ANGIOGRAPHY;  Surgeon: Corky Crafts, MD;  Location: [facility] IN[facility];  Service: Cardiovascular;  Laterality: N/A;   left thumb joint surgery  2013  PERCUTANEOUS NEPHROSTOLITHOTOMY  02/27/2011   LEFT   POLYPECTOMY     REVERSE SHOULDER ARTHROPLASTY Right 07/12/2022   Procedure: REVERSE SHOULDER ARTHROPLASTY;  Surgeon: Huel Cote, MD;  Location: [facility];  Service: Orthopedics;  Laterality: Right;   RIGHT THUMB JOINT SURG.  09/2009   TRIGGER FINGER RELEASE  2010   RIGHT THUMB   UPPER GASTROINTESTINAL ENDOSCOPY     URETERAL STENT PLACEMENT  X2  2004  &  2009   LEFT   URETEROSCOPY  04/06/2011   Procedure: URETEROSCOPY;  Surgeon: Garnett Farm, MD;  Location: [facility];  Service: Urology;  Laterality: Left;  L Ureteroscopy Laser Litho & Stent    Patient Active Problem List   Diagnosis Date Noted   Anxiety 08/19/2022   Closed 3-part fracture of proximal end of right humerus 07/11/2022   Shoulder dislocation 07/11/2022   Abnormal stress test    Atypical chest pain 08/05/2021   Bradycardia 06/20/2021   Abnormal gait  03/17/2021   Acquired deformity of lower leg 03/17/2021   Urinary urgency 11/14/2020   Paresthesia 08/15/2020   Right lumbar pain 08/15/2020   Tremor 08/15/2020   Hypoglycemia associated with diabetes (HCC) 02/29/2020   Essential (primary) hypertension 08/10/2019   Chronic neck pain 06/23/2016   Radicular low back pain 07/12/2015   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 without macular edema (HCC) 04/24/2015   Severe obesity (BMI >= 40) (HCC) 02/19/2015   OSA (obstructive sleep apnea) 10/23/2014   Excessive daytime sleepiness 10/23/2014   Hypothyroidism 09/07/2014   Tobacco abuse 01/15/2014   Recurrent UTI 09/06/2013   Orthostatic hypotension 10/05/2012   General medical examination 06/22/2011   Atrial tachycardia 11/25/2010   Pre-operative cardiovascular examination 11/25/2010   Chronic diarrhea 08/25/2010   KNEE PAIN 07/14/2010   Knee pain 07/14/2010   DIASTOLIC HEART FAILURE, CHRONIC 06/09/2010   PERSONAL HISTORY OF COLONIC POLYPS 05/01/2010   Vitamin D deficiency 01/20/2010   RESTLESS LEG SYNDROME 01/20/2010   Insomnia 01/20/2010   Restless leg syndrome 01/20/2010   Hyperlipidemia associated with type 2 diabetes mellitus (HCC) 01/08/2009   Anemia 01/08/2009   GERD 01/08/2009   Benign hypertensive heart disease with heart failure (HCC) 01/08/2009    PCP: Neena Rhymes MD   REFERRING PROVIDER: Huel Cote, MD  REFERRING DIAG: [phone] (ICD-10-CM) - Status post reverse arthroplasty of right shoulder  THERAPY DIAG:  Acute pain of right shoulder  Muscle weakness (generalized)  Stiffness of right shoulder, not elsewhere classified  Abnormal posture  Rationale for Evaluation and Treatment: Rehabilitation  ONSET DATE: Date of Surgery: 07/12/22  SUBJECTIVE:  SUBJECTIVE STATEMENT:  I'm sore this morning, didn't do anything unusual. Have been doing HEP, have been doing pendulums. Still shocked at how slow recovery is. Pendulums really make me suffer.   Hand dominance: Right  PERTINENT HISTORY: 72 year old female is 2 weeks status post right shoulder reverse shoulder arthroplasty overall doing very well. Because this is a reverse for fracture I will asked that she remain in the sling for an additional 2 weeks. She may come out of this for passive range of motion at this time 1 month postop. That time she will begin following the reverse for fracture protocol. Will plan on outpatient physical therapy.  PAIN:  Are you having pain? Yes: NPRS scale: 4/10   Pain location: R bicep   Pain description: still getting very numb  Aggravating factors: sleeping position can make pain worse, prolonged positioning  Relieving factors: Ice, "pain cream"   PRECAUTIONS: Other: reverse total shoulder for fracture protocol   WEIGHT BEARING RESTRICTIONS: Yes NWB surgical LE   FALLS:  Has patient fallen in last 6 months? Yes. Number of falls 2- one fall on ramp in the rain that led to fracture/surgery, other fall was the night she came home from the hospital and just passed out; FOF (+)  LIVING ENVIRONMENT: Lives with: lives with their spouse Lives in: House/apartment Stairs: No Has following equipment at home: Single point cane, Environmental consultant - 2 wheeled, Crutches, Wheelchair (manual), bed side commode, and Ramped entry + walk-in shower, shower bench   OCCUPATION: Retired   PLOF: Independent, Independent with basic ADLs, Independent with gait, and Independent with transfers  PATIENT GOALS: move my arm, be able to wash dishes, brush teeth R handed- generally improve level of function    OBJECTIVE:   PATIENT SURVEYS:  EVAL: FOTO 32, predicted 57 08/25/22: FOTO 49  COGNITION: Overall cognitive status: Within functional limits for tasks  assessed and very anxious about therapy      SENSATION: Reports numbness and burning in upper arm since surgery   POSTURE: Rounded shoulders, forward head, increased thoracic kyphosis   UPPER EXTREMITY ROM:   Passive ROM Right eval Right 08/11/22 Right 08/13/2022 Right 08/17/22 Right 08/21/2022 Right 08/25/22 Right 08/26/2022  Shoulder flexion 45-50* P: 87 100 P: 110 Passive 110 AA: 136 110  Shoulder extension         Shoulder scaption <20*        Shoulder abduction  P: 78  P: 110  AA: 143 (Scaption)   Shoulder internal rotation   60  65  65  Shoulder external rotation -10* to -15* (edited 08/06/22, noted that I put ER value in IR column KEU) P: 20 30  35 AA: 35 40  Elbow flexion About 75% limited PROM, very guarded         Elbow extension WNL PROM but very guarded         (Blank rows = not tested)  UPPER EXTREMITY MMT:  MMT Right eval Left eval  Shoulder flexion    Shoulder extension    Shoulder abduction    Shoulder adduction    Shoulder internal rotation    Shoulder external rotation    Middle trapezius    Lower trapezius    Elbow flexion    Elbow extension    Wrist flexion    Wrist extension    Wrist ulnar deviation    Wrist radial deviation    Wrist pronation    Wrist supination    Grip strength (lbs)    (  Blank rows = not tested)- DNT at eval due to limitations in protocol    TODAY'S TREATMENT:                                                                                                                                         DATE:   08/28/22  TherEX  PROM/stretching for shoulder mobility and function: - flexion PROM to approximately 120* - scaption PROM to approximately 130*    AAROM for shoulder mobility and strength: - flexion AAROM to approximately 110*, PT assistance approximately 30-40% - scaption AAROM to approximately 130* PT assisted approximately 20-30% - ER AAROM at side to 30* PT assistance about 20-30%  Scapular retractions x20 with 3  second holds  Selfcare  Lots of reassurance and education on course of recovery, as well as general recovery from this intense of a fracture/surgery.    08/26/2022 Standing pendulums within comfortable range: side-to-side; forward and back and circles clockwise and counterclockwise 20 times each.  Try to get her as close to parallel to the floor as possible to do these.  Active assisted supine arm raises (scapular protraction with palm facing in and left side assisting) 3 sets of 10 for 3 seconds  Side lie ER (lie left, keep Rt elbow bent 90 degrees and rotate up as far as possible) 2 sets of 10 with PT assist  Shoulder blade pinches 10 for 5 seconds  Manual PROM right shoulder flexion, IR and ER to 40 degrees  Ice right shoulder 5 minutes post exercises   08/25/22 TherEx Scapular retraction x20 reps; seated AAROM measurements - see above for details Supine chest press 2 x 5 reps; PT assisting first set with decreasing assist and only needing eccentric assist on 2nd set Standing with Rt hand on counter and step back for PROM exercise - demonstrated for home   08/21/2022 Standing pendulums within comfortable range: side-to-side; forward and back and circles clockwise and counterclockwise 30 times each.  Try to get her as close to parallel to the floor as possible to do these.  Active assisted supine arm raises (scapular protraction with palm facing in and left side assisting) 2 sets of 10 for 3 seconds  Shoulder blade pinches 10 for 5 seconds  Pulley flexion with AAROM (PT assist) protraction (palm facing in) 10 reps 10 seconds  Manual PROM right shoulder flexion, IR and ER to 35 degrees  Ice right shoulder 5 minutes post exercises   PATIENT EDUCATION: Education details: exam findings, POC, HEP; extensive education on typical recovery after shoulder surgery, also benefit of taking pain medicine prior to PT to make skilled interventions more tolerable  Person educated:  Patient Education method: Explanation, Demonstration, and Handouts Education comprehension: verbalized understanding, returned demonstration, and needs further education  HOME EXERCISE PROGRAM: Access Code: [email or web address] URL: [email or web address] Date: 08/26/2022 Prepared by: Pauletta Browns  Exercises - Seated Shoulder Pendulum Exercise  -  3-5 x daily - 7 x weekly - 1 sets - 20 reps - Gentle Horizontal Shoulder Pendulum  - 3-5 x daily - 7 x weekly - 1 sets - 20 reps - Gentle Vertical Shoulder Pendulum  - 3-5 x daily - 7 x weekly - 1 sets - 20 reps - Standing Scapular Retraction  - 5 x daily - 7 x weekly - 1 sets - 5 reps - 5 second hold - Supine Scapular Protraction in Flexion with Dumbbells  - 2-3 x daily - 7 x weekly - 1 sets - 10-20 reps - 3 seconds hold - Sidelying Shoulder External Rotation Dumbbell  - 1-2 x daily - 7 x weekly - 2 sets - 10 reps - 1 hold  ASSESSMENT:  CLINICAL IMPRESSION:  Ethie arrives today doing OK, really sore this morning. Very cooperative and pleasant but needed a lot of reassurance and education on course of recovery, as well as general recovery from this intense of a fracture/surgery. Continued working on PROM and AAROM as tolerated. Sees Dr. Steward Drone May 10th, we will get updated progress note prior to this appointment.   OBJECTIVE IMPAIRMENTS: decreased ROM, decreased strength, hypomobility, increased edema, increased fascial restrictions, increased muscle spasms, impaired flexibility, impaired UE functional use, postural dysfunction, obesity, and pain.   ACTIVITY LIMITATIONS: carrying, lifting, bed mobility, bathing, toileting, dressing, reach over head, and caring for others  PARTICIPATION LIMITATIONS: cleaning, laundry, driving, shopping, community activity, and yard work  PERSONAL FACTORS: Age, Behavior pattern, Education, Fitness, Past/current experiences, Sex, Social background, and Time since onset of injury/illness/exacerbation are also  affecting patient's functional outcome.   REHAB POTENTIAL: Good  CLINICAL DECISION MAKING: Stable/uncomplicated  EVALUATION COMPLEXITY: Low   GOALS: Goals reviewed with patient? Yes  SHORT TERM GOALS: Target date: 09/02/2022   Will be compliant with appropriate progressive HEP  Baseline: Goal status: Met 08/21/2022  2.  R shoulder flexion and scaption PROM to be at least 100 degrees, R shoulder ER PROM to be at least 20 degrees at 0* ABD  Baseline:  Goal status: Met 08/13/2022  3.  R shoulder flexion and scaption AAROM to be at least 90 degrees, R shoulder ER AAROM to reach neutral  Baseline:  Goal status: MET 08/25/2022   4.  Will demonstrate better functional posture/postural mechanics  Baseline:  Goal status: On Going 08/25/2022   LONG TERM GOALS: Target date: 09/30/2022    R shoulder flexion and scaption AROM to be at least 120 degrees  Baseline:  Goal status: On Going 08/25/2022  2.  R shoulder ER AROM to be at least 30 degrees at 45* ABD  Baseline:  Goal status: On Going 08/25/2022  3.  Will be able to reach L5 level with active FIR no increased pain  Baseline:  Goal status: Ongoing 08/25/22  4.  Will be able to perform all dressing/bathing tasks on a Mod(I) basis no increase in pain  Baseline:  Goal status: Ongoing 08/25/22  5.  Will be able to reach overhead to put dishes in the cabinet without increase in pain  Baseline:  Goal status: Ongoing 08/25/22   PLAN:  PT FREQUENCY: 3x/week  PT DURATION: 8 weeks  PLANNED INTERVENTIONS: Therapeutic exercises, Therapeutic activity, Neuromuscular re-education, Balance training, Gait training, Patient/Family education, Self Care, Joint mobilization, Aquatic Therapy, Dry Needling, Electrical stimulation, Cryotherapy, Moist heat, Taping, Ultrasound, Ionotophoresis 4mg /ml Dexamethasone, Manual therapy, and Re-evaluation  PLAN FOR NEXT SESSION:  PROM, AAROM and appropriate AROM for flexion, IR and limited range ER.  Early  phase scapular strength/functional work (without external resistance).  Progress per Dr. Serena Croissant protocol.  Hold on pulleys for now due to pain.  NEXT MD VISIT: 09/11/22  Nedra Hai PT DPT PN2

## 2022-08-31 ENCOUNTER — Encounter: Payer: Self-pay | Admitting: Physical Therapy

## 2022-08-31 ENCOUNTER — Ambulatory Visit: Payer: Medicare HMO | Admitting: Physical Therapy

## 2022-08-31 DIAGNOSIS — M6281 Muscle weakness (generalized): Secondary | ICD-10-CM

## 2022-08-31 DIAGNOSIS — M25611 Stiffness of right shoulder, not elsewhere classified: Secondary | ICD-10-CM | POA: Diagnosis not present

## 2022-08-31 DIAGNOSIS — R293 Abnormal posture: Secondary | ICD-10-CM | POA: Diagnosis not present

## 2022-08-31 DIAGNOSIS — M25511 Pain in right shoulder: Secondary | ICD-10-CM

## 2022-08-31 NOTE — Therapy (Signed)
OUTPATIENT PHYSICAL THERAPY SHOULDER TREATMENT NOTE  Patient Name: Lacey [name] MRN: [id number] DOB:[birth year], 72 y.o., female Today's Date: 08/31/2022  END OF SESSION:  PT End of Session - 08/31/22 1041     Visit Number 13    Number of Visits 25    Date for PT Re-Evaluation 09/30/22    Authorization Type [employer] MCR    Authorization Time Period 08/05/22 to 09/30/22    Progress Note Due on Visit 20    PT Start Time 1018    PT Stop Time 1056    PT Time Calculation (min) 38 min    Activity Tolerance Patient tolerated treatment well    Behavior During Therapy WFL for tasks assessed/performed               Past Medical History:  Diagnosis Date   Adenomatous colon polyp    hyperplastic   Anemia    Anginal pain (HCC)    not recent   Arthritis    NECK, KNEES, FINGERS, TOES   Atrial tachycardia CARDIOLOGIST - DR Graciela Husbands (LAST VISIT AUG 2012)   Echo 12/11: EF 55-60%, Mild LVH, grade 1 diast dysfxn;   holter 1/12: ATach   Atypical chest pain    a. 07/2004 Cath: Clean cors;  b. 04/2010 Myoview: EF 63%, no ischemia   Blood transfusion without reported diagnosis 1969   CHF (congestive heart failure) (HCC)    Chronic kidney disease    left kidney small    Coronary artery disease    Diabetes mellitus, type 2 (HCC)    ORAL AND INSULIN MEDS (LAST A1C  7.3)   Diverticulosis    Erythema CURRENT-- CLOSED ABD. WALL ABSCESS   PER PCP NOTE (03-31-11)- MRSA-- TAKES DOXYCYCLINE   GERD (gastroesophageal reflux disease)    CONTROLLED W/ PREVACID   History of kidney stones 2012   Hyperlipidemia    Hypertension    Insomnia    TAKES MEDS PRN   Neuropathy, peripheral    Obesity (BMI 30-39.9) 02/19/2015   Pneumonia    as child   Post op infection 11/07/2012   left bunionectomy   Restless leg syndrome    Sepsis, urinary HISTORY - 2004   Sleep apnea    no cpap    Thyroid disease    TIA (transient ischemic attack)    "mini strokes" 2023   Tremor    Vitamin D deficiency    Past  Surgical History:  Procedure Laterality Date   ABDOMINAL HYSTERECTOMY  1995   ovaries remain   BUNIONECTOMY Left 10/24/2012   BUNIONECTOMY Right 05/17/2017   CARDIAC CATHETERIZATION  07/10/2004   CARPAL TUNNEL RELEASE  2000   RIGHT   CATARACT EXTRACTION, BILATERAL     CERVICAL FUSION  10/12/2007   C5 - 7   COLONOSCOPY     CYSTOSCOPY WITH RETROGRADE PYELOGRAM, URETEROSCOPY AND STENT PLACEMENT Left 11/28/2012   Procedure: LEFT RETROGRADE PYELOGRAM, LEFT URETEROSCOPY, ;  Surgeon: Garnett Farm, MD;  Location: [facility];  Service: Urology;  Laterality: Left;   CYSTOSCOPY/RETROGRADE/URETEROSCOPY/STONE EXTRACTION WITH BASKET  X2 2004 & 2009   LEFT    GASTRIC BYPASS  1981   KNEE ARTHROSCOPY  12/2010   LEFT   LAPAROSCOPIC CHOLECYSTECTOMY  2001   LEFT HEART CATH AND CORONARY ANGIOGRAPHY N/A 12/03/2021   Procedure: LEFT HEART CATH AND CORONARY ANGIOGRAPHY;  Surgeon: Corky Crafts, MD;  Location: [facility] INVASIVE CV LAB;  Service: Cardiovascular;  Laterality: N/A;   left thumb joint surgery  2013  PERCUTANEOUS NEPHROSTOLITHOTOMY  02/27/2011   LEFT   POLYPECTOMY     REVERSE SHOULDER ARTHROPLASTY Right 07/12/2022   Procedure: REVERSE SHOULDER ARTHROPLASTY;  Surgeon: Huel Cote, MD;  Location: [facility];  Service: Orthopedics;  Laterality: Right;   RIGHT THUMB JOINT SURG.  09/2009   TRIGGER FINGER RELEASE  2010   RIGHT THUMB   UPPER GASTROINTESTINAL ENDOSCOPY     URETERAL STENT PLACEMENT  X2  2004  &  2009   LEFT   URETEROSCOPY  04/06/2011   Procedure: URETEROSCOPY;  Surgeon: Garnett Farm, MD;  Location: [facility];  Service: Urology;  Laterality: Left;  L Ureteroscopy Laser Litho & Stent    Patient Active Problem List   Diagnosis Date Noted   Anxiety 08/19/2022   Closed 3-part fracture of proximal end of right humerus 07/11/2022   Shoulder dislocation 07/11/2022   Abnormal stress test    Atypical chest pain 08/05/2021   Bradycardia 06/20/2021   Abnormal gait  03/17/2021   Acquired deformity of lower leg 03/17/2021   Urinary urgency 11/14/2020   Paresthesia 08/15/2020   Right lumbar pain 08/15/2020   Tremor 08/15/2020   Hypoglycemia associated with diabetes (HCC) 02/29/2020   Essential (primary) hypertension 08/10/2019   Chronic neck pain 06/23/2016   Radicular low back pain 07/12/2015   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 without macular edema (HCC) 04/24/2015   Severe obesity (BMI >= 40) (HCC) 02/19/2015   OSA (obstructive sleep apnea) 10/23/2014   Excessive daytime sleepiness 10/23/2014   Hypothyroidism 09/07/2014   Tobacco abuse 01/15/2014   Recurrent UTI 09/06/2013   Orthostatic hypotension 10/05/2012   General medical examination 06/22/2011   Atrial tachycardia 11/25/2010   Pre-operative cardiovascular examination 11/25/2010   Chronic diarrhea 08/25/2010   KNEE PAIN 07/14/2010   Knee pain 07/14/2010   DIASTOLIC HEART FAILURE, CHRONIC 06/09/2010   PERSONAL HISTORY OF COLONIC POLYPS 05/01/2010   Vitamin D deficiency 01/20/2010   RESTLESS LEG SYNDROME 01/20/2010   Insomnia 01/20/2010   Restless leg syndrome 01/20/2010   Hyperlipidemia associated with type 2 diabetes mellitus (HCC) 01/08/2009   Anemia 01/08/2009   GERD 01/08/2009   Benign hypertensive heart disease with heart failure (HCC) 01/08/2009    PCP: Neena Rhymes MD   REFERRING PROVIDER: Huel Cote, MD  REFERRING DIAG: [id number] (ICD-10-CM) - Status post reverse arthroplasty of right shoulder  THERAPY DIAG:  Acute pain of right shoulder  Muscle weakness (generalized)  Stiffness of right shoulder, not elsewhere classified  Abnormal posture  Rationale for Evaluation and Treatment: Rehabilitation  ONSET DATE: Date of Surgery: 07/12/22  SUBJECTIVE:  SUBJECTIVE STATEMENT:  Feeling better this morning, not having as much pain/soreness. Trying to get from sleeping on the couch to the bed. I can raise the head of the bed, as long as I can't turn over on my right side. Sometimes I notice a little pain in the shoulder, like a quick stab, sometimes I notice a burn.   Hand dominance: Right  PERTINENT HISTORY: 72 year old female is 2 weeks status post right shoulder reverse shoulder arthroplasty overall doing very well. Because this is a reverse for fracture I will asked that she remain in the sling for an additional 2 weeks. She may come out of this for passive range of motion at this time 1 month postop. That time she will begin following the reverse for fracture protocol. Will plan on outpatient physical therapy.  PAIN:  Are you having pain? Yes: NPRS scale: 3/10   Pain location: R bicep and down into elbow    Pain description: still getting very numb  Aggravating factors: sleeping position can make pain worse, prolonged positioning  Relieving factors: Ice, "pain cream"   PRECAUTIONS: Other: reverse total shoulder for fracture protocol   WEIGHT BEARING RESTRICTIONS: Yes NWB surgical LE   FALLS:  Has patient fallen in last 6 months? Yes. Number of falls 2- one fall on ramp in the rain that led to fracture/surgery, other fall was the night she came home from the hospital and just passed out; FOF (+)  LIVING ENVIRONMENT: Lives with: lives with their spouse Lives in: House/apartment Stairs: No Has following equipment at home: Single point cane, Environmental consultant - 2 wheeled, Crutches, Wheelchair (manual), bed side commode, and Ramped entry + walk-in shower, shower bench   OCCUPATION: Retired   PLOF: Independent, Independent with basic ADLs, Independent with gait, and Independent with transfers  PATIENT GOALS: move my arm, be able to wash dishes, brush teeth R handed- generally improve level of function    OBJECTIVE:    PATIENT SURVEYS:  EVAL: FOTO 32, predicted 57 08/25/22: FOTO 49  COGNITION: Overall cognitive status: Within functional limits for tasks assessed and very anxious about therapy      SENSATION: Reports numbness and burning in upper arm since surgery   POSTURE: Rounded shoulders, forward head, increased thoracic kyphosis   UPPER EXTREMITY ROM:   Passive ROM Right eval Right 08/11/22 Right 08/13/2022 Right 08/17/22 Right 08/21/2022 Right 08/25/22 Right 08/26/2022  Shoulder flexion 45-50* P: 87 100 P: 110 Passive 110 AA: 136 110  Shoulder extension         Shoulder scaption <20*        Shoulder abduction  P: 78  P: 110  AA: 143 (Scaption)   Shoulder internal rotation   60  65  65  Shoulder external rotation -10* to -15* (edited 08/06/22, noted that I put ER value in IR column KEU) P: 20 30  35 AA: 35 40  Elbow flexion About 75% limited PROM, very guarded         Elbow extension WNL PROM but very guarded         (Blank rows = not tested)  UPPER EXTREMITY MMT:  MMT Right eval Left eval  Shoulder flexion    Shoulder extension    Shoulder abduction    Shoulder adduction    Shoulder internal rotation    Shoulder external rotation    Middle trapezius    Lower trapezius    Elbow flexion    Elbow extension    Wrist flexion  Wrist extension    Wrist ulnar deviation    Wrist radial deviation    Wrist pronation    Wrist supination    Grip strength (lbs)    (Blank rows = not tested)- DNT at eval due to limitations in protocol    TODAY'S TREATMENT:                                                                                                                                         DATE:   08/31/22  TherEx  PROM/stretching for shoulder mobility and function: - flexion PROM to approximately 130-140* - scaption PROM to approximately 120-130*  - ER PROM to approximately 30* per protocol  Scap retraction x20 with 3 second holds   AAROM for shoulder mobility and  strength: - flexion AAROM with 1# rod, MinA from PT for controlled lower  - abduction AAROM with PT assist about 50% sidelying to 70-80 degrees - ER AAROM with PT assist about 50% sidelying to no more than 30 degrees per protocol   08/28/22  TherEX  PROM/stretching for shoulder mobility and function: - flexion PROM to approximately 120* - scaption PROM to approximately 130*    AAROM for shoulder mobility and strength: - flexion AAROM to approximately 110*, PT assistance approximately 30-40% - scaption AAROM to approximately 130* PT assisted approximately 20-30% - ER AAROM at side to 30* PT assistance about 20-30%  Scapular retractions x20 with 3 second holds  Selfcare  Lots of reassurance and education on course of recovery, as well as general recovery from this intense of a fracture/surgery.    08/26/2022 Standing pendulums within comfortable range: side-to-side; forward and back and circles clockwise and counterclockwise 20 times each.  Try to get her as close to parallel to the floor as possible to do these.  Active assisted supine arm raises (scapular protraction with palm facing in and left side assisting) 3 sets of 10 for 3 seconds  Side lie ER (lie left, keep Rt elbow bent 90 degrees and rotate up as far as possible) 2 sets of 10 with PT assist  Shoulder blade pinches 10 for 5 seconds  Manual PROM right shoulder flexion, IR and ER to 40 degrees  Ice right shoulder 5 minutes post exercises   08/25/22 TherEx Scapular retraction x20 reps; seated AAROM measurements - see above for details Supine chest press 2 x 5 reps; PT assisting first set with decreasing assist and only needing eccentric assist on 2nd set Standing with Rt hand on counter and step back for PROM exercise - demonstrated for home   08/21/2022 Standing pendulums within comfortable range: side-to-side; forward and back and circles clockwise and counterclockwise 30 times each.  Try to get her as close to  parallel to the floor as possible to do these.  Active assisted supine arm raises (scapular protraction with palm facing in and left side assisting) 2 sets  of 10 for 3 seconds  Shoulder blade pinches 10 for 5 seconds  Pulley flexion with AAROM (PT assist) protraction (palm facing in) 10 reps 10 seconds  Manual PROM right shoulder flexion, IR and ER to 35 degrees  Ice right shoulder 5 minutes post exercises   PATIENT EDUCATION: Education details: exam findings, POC, HEP; extensive education on typical recovery after shoulder surgery, also benefit of taking pain medicine prior to PT to make skilled interventions more tolerable  Person educated: Patient Education method: Explanation, Demonstration, and Handouts Education comprehension: verbalized understanding, returned demonstration, and needs further education  HOME EXERCISE PROGRAM: Access Code: [email or web address] URL: [email or web address] Date: 08/26/2022 Prepared by: Pauletta Browns  Exercises - Seated Shoulder Pendulum Exercise  - 3-5 x daily - 7 x weekly - 1 sets - 20 reps - Gentle Horizontal Shoulder Pendulum  - 3-5 x daily - 7 x weekly - 1 sets - 20 reps - Gentle Vertical Shoulder Pendulum  - 3-5 x daily - 7 x weekly - 1 sets - 20 reps - Standing Scapular Retraction  - 5 x daily - 7 x weekly - 1 sets - 5 reps - 5 second hold - Supine Scapular Protraction in Flexion with Dumbbells  - 2-3 x daily - 7 x weekly - 1 sets - 10-20 reps - 3 seconds hold - Sidelying Shoulder External Rotation Dumbbell  - 1-2 x daily - 7 x weekly - 2 sets - 10 reps - 1 hold  ASSESSMENT:  CLINICAL IMPRESSION:  Lacey [name] arrives today doing a little better today, still very talkative with this therapist, responded well to gentle redirection. Continued working on PROM and AAROM as tolerated. Progress has been slow but steady. Steristrips were coming off and incision looked healed so I went ahead and peeled these off today.   OBJECTIVE IMPAIRMENTS:  decreased ROM, decreased strength, hypomobility, increased edema, increased fascial restrictions, increased muscle spasms, impaired flexibility, impaired UE functional use, postural dysfunction, obesity, and pain.   ACTIVITY LIMITATIONS: carrying, lifting, bed mobility, bathing, toileting, dressing, reach over head, and caring for others  PARTICIPATION LIMITATIONS: cleaning, laundry, driving, shopping, community activity, and yard work  PERSONAL FACTORS: Age, Behavior pattern, Education, Fitness, Past/current experiences, Sex, Social background, and Time since onset of injury/illness/exacerbation are also affecting patient's functional outcome.   REHAB POTENTIAL: Good  CLINICAL DECISION MAKING: Stable/uncomplicated  EVALUATION COMPLEXITY: Low   GOALS: Goals reviewed with patient? Yes  SHORT TERM GOALS: Target date: 09/02/2022   Will be compliant with appropriate progressive HEP  Baseline: Goal status: Met 08/21/2022  2.  R shoulder flexion and scaption PROM to be at least 100 degrees, R shoulder ER PROM to be at least 20 degrees at 0* ABD  Baseline:  Goal status: Met 08/13/2022  3.  R shoulder flexion and scaption AAROM to be at least 90 degrees, R shoulder ER AAROM to reach neutral  Baseline:  Goal status: MET 08/25/2022   4.  Will demonstrate better functional posture/postural mechanics  Baseline:  Goal status: On Going 08/25/2022   LONG TERM GOALS: Target date: 09/30/2022    R shoulder flexion and scaption AROM to be at least 120 degrees  Baseline:  Goal status: On Going 08/25/2022  2.  R shoulder ER AROM to be at least 30 degrees at 45* ABD  Baseline:  Goal status: On Going 08/25/2022  3.  Will be able to reach L5 level with active FIR no increased pain  Baseline:  Goal status: Ongoing 08/25/22  4.  Will  be able to perform all dressing/bathing tasks on a Mod(I) basis no increase in pain  Baseline:  Goal status: Ongoing 08/25/22  5.  Will be able to reach overhead to  put dishes in the cabinet without increase in pain  Baseline:  Goal status: Ongoing 08/25/22   PLAN:  PT FREQUENCY: 3x/week  PT DURATION: 8 weeks  PLANNED INTERVENTIONS: Therapeutic exercises, Therapeutic activity, Neuromuscular re-education, Balance training, Gait training, Patient/Family education, Self Care, Joint mobilization, Aquatic Therapy, Dry Needling, Electrical stimulation, Cryotherapy, Moist heat, Taping, Ultrasound, Ionotophoresis 4mg /ml Dexamethasone, Manual therapy, and Re-evaluation  PLAN FOR NEXT SESSION:  PROM, AAROM and appropriate AROM for flexion, IR and limited range ER.  Early phase scapular strength/functional work (without external resistance).  Progress per Dr. Serena Croissant protocol.  Hold on pulleys for now due to pain.  NEXT MD VISIT: 09/11/22  Nedra Hai PT DPT PN2

## 2022-09-02 ENCOUNTER — Ambulatory Visit: Payer: Medicare HMO | Admitting: Physical Therapy

## 2022-09-02 ENCOUNTER — Encounter: Payer: Self-pay | Admitting: Physical Therapy

## 2022-09-02 DIAGNOSIS — M25511 Pain in right shoulder: Secondary | ICD-10-CM

## 2022-09-02 DIAGNOSIS — R293 Abnormal posture: Secondary | ICD-10-CM

## 2022-09-02 DIAGNOSIS — M25611 Stiffness of right shoulder, not elsewhere classified: Secondary | ICD-10-CM

## 2022-09-02 DIAGNOSIS — M6281 Muscle weakness (generalized): Secondary | ICD-10-CM

## 2022-09-02 NOTE — Therapy (Signed)
OUTPATIENT PHYSICAL THERAPY SHOULDER TREATMENT NOTE  Patient Name: Krystian Ferrentino MRN: [id number] DOB:[birth year], 72 y.o., female Today's Date: 09/02/2022  END OF SESSION:  PT End of Session - 09/02/22 1033     Visit Number 14    Number of Visits 25    Date for PT Re-Evaluation 09/30/22    Authorization Type [employer] MCR    Authorization Time Period 08/05/22 to 09/30/22    Progress Note Due on Visit 20    PT Start Time 1017    PT Stop Time 1056    PT Time Calculation (min) 39 min    Activity Tolerance Patient tolerated treatment well    Behavior During Therapy WFL for tasks assessed/performed                Past Medical History:  Diagnosis Date   Adenomatous colon polyp    hyperplastic   Anemia    Anginal pain (HCC)    not recent   Arthritis    NECK, KNEES, FINGERS, TOES   Atrial tachycardia CARDIOLOGIST - DR Graciela Husbands (LAST VISIT AUG 2012)   Echo 12/11: EF 55-60%, Mild LVH, grade 1 diast dysfxn;   holter 1/12: ATach   Atypical chest pain    a. 07/2004 Cath: Clean cors;  b. 04/2010 Myoview: EF 63%, no ischemia   Blood transfusion without reported diagnosis 1969   CHF (congestive heart failure) (HCC)    Chronic kidney disease    left kidney small    Coronary artery disease    Diabetes mellitus, type 2 (HCC)    ORAL AND INSULIN MEDS (LAST A1C  7.3)   Diverticulosis    Erythema CURRENT-- CLOSED ABD. WALL ABSCESS   PER PCP NOTE (03-31-11)- MRSA-- TAKES DOXYCYCLINE   GERD (gastroesophageal reflux disease)    CONTROLLED W/ PREVACID   History of kidney stones 2012   Hyperlipidemia    Hypertension    Insomnia    TAKES MEDS PRN   Neuropathy, peripheral    Obesity (BMI 30-39.9) 02/19/2015   Pneumonia    as child   Post op infection 11/07/2012   left bunionectomy   Restless leg syndrome    Sepsis, urinary HISTORY - 2004   Sleep apnea    no cpap    Thyroid disease    TIA (transient ischemic attack)    "mini strokes" 2023   Tremor    Vitamin D deficiency    Past  Surgical History:  Procedure Laterality Date   ABDOMINAL HYSTERECTOMY  1995   ovaries remain   BUNIONECTOMY Left 10/24/2012   BUNIONECTOMY Right 05/17/2017   CARDIAC CATHETERIZATION  07/10/2004   CARPAL TUNNEL RELEASE  2000   RIGHT   CATARACT EXTRACTION, BILATERAL     CERVICAL FUSION  10/12/2007   C5 - 7   COLONOSCOPY     CYSTOSCOPY WITH RETROGRADE PYELOGRAM, URETEROSCOPY AND STENT PLACEMENT Left 11/28/2012   Procedure: LEFT RETROGRADE PYELOGRAM, LEFT URETEROSCOPY, ;  Surgeon: Garnett Farm, MD;  Location: [facility];  Service: Urology;  Laterality: Left;   CYSTOSCOPY/RETROGRADE/URETEROSCOPY/STONE EXTRACTION WITH BASKET  X2 2004 & 2009   LEFT    GASTRIC BYPASS  1981   KNEE ARTHROSCOPY  12/2010   LEFT   LAPAROSCOPIC CHOLECYSTECTOMY  2001   LEFT HEART CATH AND CORONARY ANGIOGRAPHY N/A 12/03/2021   Procedure: LEFT HEART CATH AND CORONARY ANGIOGRAPHY;  Surgeon: Corky Crafts, MD;  Location: [facility] INVASIVE CV LAB;  Service: Cardiovascular;  Laterality: N/A;   left thumb joint surgery  2013  PERCUTANEOUS NEPHROSTOLITHOTOMY  02/27/2011   LEFT   POLYPECTOMY     REVERSE SHOULDER ARTHROPLASTY Right 07/12/2022   Procedure: REVERSE SHOULDER ARTHROPLASTY;  Surgeon: Huel Cote, MD;  Location: [facility];  Service: Orthopedics;  Laterality: Right;   RIGHT THUMB JOINT SURG.  09/2009   TRIGGER FINGER RELEASE  2010   RIGHT THUMB   UPPER GASTROINTESTINAL ENDOSCOPY     URETERAL STENT PLACEMENT  X2  2004  &  2009   LEFT   URETEROSCOPY  04/06/2011   Procedure: URETEROSCOPY;  Surgeon: Garnett Farm, MD;  [facility];  Service: Urology;  Laterality: Left;  L Ureteroscopy Laser Litho & Stent    Patient Active Problem List   Diagnosis Date Noted   Anxiety 08/19/2022   Closed 3-part fracture of proximal end of right humerus 07/11/2022   Shoulder dislocation 07/11/2022   Abnormal stress test    Atypical chest pain 08/05/2021   Bradycardia 06/20/2021   Abnormal gait  03/17/2021   Acquired deformity of lower leg 03/17/2021   Urinary urgency 11/14/2020   Paresthesia 08/15/2020   Right lumbar pain 08/15/2020   Tremor 08/15/2020   Hypoglycemia associated with diabetes (HCC) 02/29/2020   Essential (primary) hypertension 08/10/2019   Chronic neck pain 06/23/2016   Radicular low back pain 07/12/2015   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 without macular edema (HCC) 04/24/2015   Severe obesity (BMI >= 40) (HCC) 02/19/2015   OSA (obstructive sleep apnea) 10/23/2014   Excessive daytime sleepiness 10/23/2014   Hypothyroidism 09/07/2014   Tobacco abuse 01/15/2014   Recurrent UTI 09/06/2013   Orthostatic hypotension 10/05/2012   General medical examination 06/22/2011   Atrial tachycardia 11/25/2010   Pre-operative cardiovascular examination 11/25/2010   Chronic diarrhea 08/25/2010   KNEE PAIN 07/14/2010   Knee pain 07/14/2010   DIASTOLIC HEART FAILURE, CHRONIC 06/09/2010   PERSONAL HISTORY OF COLONIC POLYPS 05/01/2010   Vitamin D deficiency 01/20/2010   RESTLESS LEG SYNDROME 01/20/2010   Insomnia 01/20/2010   Restless leg syndrome 01/20/2010   Hyperlipidemia associated with type 2 diabetes mellitus (HCC) 01/08/2009   Anemia 01/08/2009   GERD 01/08/2009   Benign hypertensive heart disease with heart failure (HCC) 01/08/2009    PCP: Neena Rhymes MD   REFERRING PROVIDER: Huel Cote, MD  REFERRING DIAG: [phone] (ICD-10-CM) - Status post reverse arthroplasty of right shoulder  THERAPY DIAG:  Acute pain of right shoulder  Muscle weakness (generalized)  Stiffness of right shoulder, not elsewhere classified  Abnormal posture  Rationale for Evaluation and Treatment: Rehabilitation  ONSET DATE: Date of Surgery: 07/12/22  SUBJECTIVE:  SUBJECTIVE STATEMENT:  The shoulder was hurting more last night I don't know why, its ok now. Getting more of a numb burning feeling. I talked to my son a week after I got home from the hospital and he said he's getting the same numbness/burning.   Hand dominance: Right  PERTINENT HISTORY: 72 year old female is 2 weeks status post right shoulder reverse shoulder arthroplasty overall doing very well. Because this is a reverse for fracture I will asked that she remain in the sling for an additional 2 weeks. She may come out of this for passive range of motion at this time 1 month postop. That time she will begin following the reverse for fracture protocol. Will plan on outpatient physical therapy.  PAIN:  Are you having pain? Yes: NPRS scale: 2/10   Pain location: R wrist and shoulder    Pain description: burning numbness  Aggravating factors: "I don't know but I think its the way I'm sleeping"  Relieving factors: going to sleep    PRECAUTIONS: Other: reverse total shoulder for fracture protocol   WEIGHT BEARING RESTRICTIONS: Yes NWB surgical LE   FALLS:  Has patient fallen in last 6 months? Yes. Number of falls 2- one fall on ramp in the rain that led to fracture/surgery, other fall was the night she came home from the hospital and just passed out; FOF (+)  LIVING ENVIRONMENT: Lives with: lives with their spouse Lives in: House/apartment Stairs: No Has following equipment at home: Single point cane, Environmental consultant - 2 wheeled, Crutches, Wheelchair (manual), bed side commode, and Ramped entry + walk-in shower, shower bench   OCCUPATION: Retired   PLOF: Independent, Independent with basic ADLs, Independent with gait, and Independent with transfers  PATIENT GOALS: move my arm, be able to wash dishes, brush teeth R handed- generally improve level of function    OBJECTIVE:   PATIENT SURVEYS:  EVAL: FOTO 32, predicted 57 08/25/22: FOTO 49  COGNITION: Overall  cognitive status: Within functional limits for tasks assessed and very anxious about therapy      SENSATION: Reports numbness and burning in upper arm since surgery   POSTURE: Rounded shoulders, forward head, increased thoracic kyphosis   UPPER EXTREMITY ROM:   Passive ROM Right eval Right 08/11/22 Right 08/13/2022 Right 08/17/22 Right 08/21/2022 Right 08/25/22 Right 08/26/2022 Right 09/02/22  Shoulder flexion 45-50* P: 87 100 P: 110 Passive 110 AA: 136 110 PROM 120-130* supine, AAROM 120* supine    Shoulder extension          Shoulder scaption <20*       PROM 130-140*, 110* AAROM   Shoulder abduction  P: 78  P: 110  AA: 143 (Scaption)    Shoulder internal rotation   60  65  65   Shoulder external rotation -10* to -15* (edited 08/06/22, noted that I put ER value in IR column KEU) P: 20 30  35 AA: 35 40 PROM with arm at side 40-45*; AAROM 30-40*   Elbow flexion About 75% limited PROM, very guarded          Elbow extension WNL PROM but very guarded          (Blank rows = not tested)  UPPER EXTREMITY MMT:  MMT Right eval Left eval  Shoulder flexion    Shoulder extension    Shoulder abduction    Shoulder adduction    Shoulder internal rotation    Shoulder external rotation    Middle trapezius    Lower trapezius  Elbow flexion    Elbow extension    Wrist flexion    Wrist extension    Wrist ulnar deviation    Wrist radial deviation    Wrist pronation    Wrist supination    Grip strength (lbs)    (Blank rows = not tested)- DNT at eval due to limitations in protocol    TODAY'S TREATMENT:                                                                                                                                         DATE:   09/02/22  TherEx  PROM/stretching for shoulder mobility/function: - flexion, scaption, ER PROM within limits of protocol  AAROM for shoulder mobility and strength with 1# bar - flexion with PT assisting 10-20% x10 -scaption with PT assisting  40-50* x10 - ER with PT assisting about 50% x10    08/31/22  TherEx  PROM/stretching for shoulder mobility and function: - flexion PROM to approximately 130-140* - scaption PROM to approximately 120-130*  - ER PROM to approximately 30* per protocol  Scap retraction x20 with 3 second holds   AAROM for shoulder mobility and strength: - flexion AAROM with 1# rod, MinA from PT for controlled lower  - abduction AAROM with PT assist about 50% sidelying to 70-80 degrees - ER AAROM with PT assist about 50% sidelying to no more than 30 degrees per protocol   08/28/22  TherEX  PROM/stretching for shoulder mobility and function: - flexion PROM to approximately 120* - scaption PROM to approximately 130*    AAROM for shoulder mobility and strength: - flexion AAROM to approximately 110*, PT assistance approximately 30-40% - scaption AAROM to approximately 130* PT assisted approximately 20-30% - ER AAROM at side to 30* PT assistance about 20-30%  Scapular retractions x20 with 3 second holds  Selfcare  Lots of reassurance and education on course of recovery, as well as general recovery from this intense of a fracture/surgery.    08/26/2022 Standing pendulums within comfortable range: side-to-side; forward and back and circles clockwise and counterclockwise 20 times each.  Try to get her as close to parallel to the floor as possible to do these.  Active assisted supine arm raises (scapular protraction with palm facing in and left side assisting) 3 sets of 10 for 3 seconds  Side lie ER (lie left, keep Rt elbow bent 90 degrees and rotate up as far as possible) 2 sets of 10 with PT assist  Shoulder blade pinches 10 for 5 seconds  Manual PROM right shoulder flexion, IR and ER to 40 degrees  Ice right shoulder 5 minutes post exercises   08/25/22 TherEx Scapular retraction x20 reps; seated AAROM measurements - see above for details Supine chest press 2 x 5 reps; PT assisting first  set with decreasing assist and only needing eccentric assist on 2nd set Standing with Rt hand on  counter and step back for PROM exercise - demonstrated for home   08/21/2022 Standing pendulums within comfortable range: side-to-side; forward and back and circles clockwise and counterclockwise 30 times each.  Try to get her as close to parallel to the floor as possible to do these.  Active assisted supine arm raises (scapular protraction with palm facing in and left side assisting) 2 sets of 10 for 3 seconds  Shoulder blade pinches 10 for 5 seconds  Pulley flexion with AAROM (PT assist) protraction (palm facing in) 10 reps 10 seconds  Manual PROM right shoulder flexion, IR and ER to 35 degrees  Ice right shoulder 5 minutes post exercises   PATIENT EDUCATION: Education details: exam findings, POC, HEP; extensive education on typical recovery after shoulder surgery, also benefit of taking pain medicine prior to PT to make skilled interventions more tolerable  Person educated: Patient Education method: Explanation, Demonstration, and Handouts Education comprehension: verbalized understanding, returned demonstration, and needs further education  HOME EXERCISE PROGRAM: Access Code: [email or web address] URL: [email or web address] Date: 08/26/2022 Prepared by: Pauletta Browns  Exercises - Seated Shoulder Pendulum Exercise  - 3-5 x daily - 7 x weekly - 1 sets - 20 reps - Gentle Horizontal Shoulder Pendulum  - 3-5 x daily - 7 x weekly - 1 sets - 20 reps - Gentle Vertical Shoulder Pendulum  - 3-5 x daily - 7 x weekly - 1 sets - 20 reps - Standing Scapular Retraction  - 5 x daily - 7 x weekly - 1 sets - 5 reps - 5 second hold - Supine Scapular Protraction in Flexion with Dumbbells  - 2-3 x daily - 7 x weekly - 1 sets - 10-20 reps - 3 seconds hold - Sidelying Shoulder External Rotation Dumbbell  - 1-2 x daily - 7 x weekly - 2 sets - 10 reps - 1 hold  ASSESSMENT:  CLINICAL IMPRESSION:  Myha  arrives today doing OK, still frustrated about slow course of recovery. Additional education provided regarding severity of fracture and intensity of surgery, slow course of rehab in general in the cases of reverse TSR in the context of fall/fracture. Continued working on [employer] per protocol, also worked on some periscapular strength as able. Will continue efforts, sees MD soon.   OBJECTIVE IMPAIRMENTS: decreased ROM, decreased strength, hypomobility, increased edema, increased fascial restrictions, increased muscle spasms, impaired flexibility, impaired UE functional use, postural dysfunction, obesity, and pain.   ACTIVITY LIMITATIONS: carrying, lifting, bed mobility, bathing, toileting, dressing, reach over head, and caring for others  PARTICIPATION LIMITATIONS: cleaning, laundry, driving, shopping, community activity, and yard work  PERSONAL FACTORS: Age, Behavior pattern, Education, Fitness, Past/current experiences, Sex, Social background, and Time since onset of injury/illness/exacerbation are also affecting patient's functional outcome.   REHAB POTENTIAL: Good  CLINICAL DECISION MAKING: Stable/uncomplicated  EVALUATION COMPLEXITY: Low   GOALS: Goals reviewed with patient? Yes  SHORT TERM GOALS: Target date: 09/02/2022   Will be compliant with appropriate progressive HEP  Baseline: Goal status: Met 08/21/2022  2.  R shoulder flexion and scaption PROM to be at least 100 degrees, R shoulder ER PROM to be at least 20 degrees at 0* ABD  Baseline:  Goal status: Met 08/13/2022  3.  R shoulder flexion and scaption AAROM to be at least 90 degrees, R shoulder ER AAROM to reach neutral  Baseline:  Goal status: MET 08/25/2022   4.  Will demonstrate better functional posture/postural mechanics  Baseline:  Goal status: On Going 08/25/2022   LONG TERM GOALS: Target  date: 09/30/2022    R shoulder flexion and scaption AROM to be at least 120 degrees  Baseline:  Goal status: On Going  08/25/2022  2.  R shoulder ER AROM to be at least 30 degrees at 45* ABD  Baseline:  Goal status: On Going 08/25/2022  3.  Will be able to reach L5 level with active FIR no increased pain  Baseline:  Goal status: Ongoing 08/25/22  4.  Will be able to perform all dressing/bathing tasks on a Mod(I) basis no increase in pain  Baseline:  Goal status: Ongoing 08/25/22  5.  Will be able to reach overhead to put dishes in the cabinet without increase in pain  Baseline:  Goal status: Ongoing 08/25/22   PLAN:  PT FREQUENCY: 3x/week  PT DURATION: 8 weeks  PLANNED INTERVENTIONS: Therapeutic exercises, Therapeutic activity, Neuromuscular re-education, Balance training, Gait training, Patient/Family education, Self Care, Joint mobilization, Aquatic Therapy, Dry Needling, Electrical stimulation, Cryotherapy, Moist heat, Taping, Ultrasound, Ionotophoresis 4mg /ml Dexamethasone, Manual therapy, and Re-evaluation  PLAN FOR NEXT SESSION:  PROM, AAROM and appropriate AROM for flexion, IR and limited range ER.  Early phase scapular strength/functional work (without external resistance).  Progress per Dr. Serena Croissant protocol.  Hold on pulleys for now due to pain. Now at 7 weeks post-op  NEXT MD VISIT: 09/11/22  Nedra Hai PT DPT PN2

## 2022-09-04 ENCOUNTER — Ambulatory Visit: Payer: Medicare HMO | Admitting: Physical Therapy

## 2022-09-04 ENCOUNTER — Encounter: Payer: Self-pay | Admitting: Physical Therapy

## 2022-09-04 DIAGNOSIS — M6281 Muscle weakness (generalized): Secondary | ICD-10-CM

## 2022-09-04 DIAGNOSIS — R293 Abnormal posture: Secondary | ICD-10-CM

## 2022-09-04 DIAGNOSIS — M25511 Pain in right shoulder: Secondary | ICD-10-CM | POA: Diagnosis not present

## 2022-09-04 DIAGNOSIS — M25611 Stiffness of right shoulder, not elsewhere classified: Secondary | ICD-10-CM

## 2022-09-04 NOTE — Therapy (Signed)
OUTPATIENT PHYSICAL THERAPY SHOULDER TREATMENT NOTE  Patient Name: Lacey [name] MRN: [id number] DOB:08-26-1950, 72 y.o., female Today's Date: 09/04/2022  END OF SESSION:  PT End of Session - 09/04/22 1056     Visit Number 15    Number of Visits 25    Date for PT Re-Evaluation 09/30/22    Authorization Type [employer] MCR    Authorization Time Period 08/05/22 to 09/30/22    PT Start Time 1017    PT Stop Time 1056    PT Time Calculation (min) 39 min    Activity Tolerance Patient tolerated treatment well    Behavior During Therapy WFL for tasks assessed/performed                 Past Medical History:  Diagnosis Date   Adenomatous colon polyp    hyperplastic   Anemia    Anginal pain (HCC)    not recent   Arthritis    NECK, KNEES, FINGERS, TOES   Atrial tachycardia CARDIOLOGIST - DR Graciela Husbands (LAST VISIT AUG 2012)   Echo 12/11: EF 55-60%, Mild LVH, grade 1 diast dysfxn;   holter 1/12: ATach   Atypical chest pain    a. 07/2004 Cath: Clean cors;  b. 04/2010 Myoview: EF 63%, no ischemia   Blood transfusion without reported diagnosis 1969   CHF (congestive heart failure) (HCC)    Chronic kidney disease    left kidney small    Coronary artery disease    Diabetes mellitus, type 2 (HCC)    ORAL AND INSULIN MEDS (LAST A1C  7.3)   Diverticulosis    Erythema CURRENT-- CLOSED ABD. WALL ABSCESS   PER PCP NOTE (03-31-11)- MRSA-- TAKES DOXYCYCLINE   GERD (gastroesophageal reflux disease)    CONTROLLED W/ PREVACID   History of kidney stones 2012   Hyperlipidemia    Hypertension    Insomnia    TAKES MEDS PRN   Neuropathy, peripheral    Obesity (BMI 30-39.9) 02/19/2015   Pneumonia    as child   Post op infection 11/07/2012   left bunionectomy   Restless leg syndrome    Sepsis, urinary HISTORY - 2004   Sleep apnea    no cpap    Thyroid disease    TIA (transient ischemic attack)    "mini strokes" 2023   Tremor    Vitamin D deficiency    Past Surgical History:  Procedure  Laterality Date   ABDOMINAL HYSTERECTOMY  1995   ovaries remain   BUNIONECTOMY Left 10/24/2012   BUNIONECTOMY Right 05/17/2017   CARDIAC CATHETERIZATION  07/10/2004   CARPAL TUNNEL RELEASE  2000   RIGHT   CATARACT EXTRACTION, BILATERAL     CERVICAL FUSION  10/12/2007   C5 - 7   COLONOSCOPY     CYSTOSCOPY WITH RETROGRADE PYELOGRAM, URETEROSCOPY AND STENT PLACEMENT Left 11/28/2012   Procedure: LEFT RETROGRADE PYELOGRAM, LEFT URETEROSCOPY, ;  Surgeon: Garnett Farm, MD;  Location: [facility];  Service: Urology;  Laterality: Left;   CYSTOSCOPY/RETROGRADE/URETEROSCOPY/STONE EXTRACTION WITH BASKET  X2 2004 & 2009   LEFT    GASTRIC BYPASS  1981   KNEE ARTHROSCOPY  12/2010   LEFT   LAPAROSCOPIC CHOLECYSTECTOMY  2001   LEFT HEART CATH AND CORONARY ANGIOGRAPHY N/A 12/03/2021   Procedure: LEFT HEART CATH AND CORONARY ANGIOGRAPHY;  Surgeon: Corky Crafts, MD;  Location: [facility];  Service: Cardiovascular;  Laterality: N/A;   left thumb joint surgery  2013   PERCUTANEOUS NEPHROSTOLITHOTOMY  02/27/2011  LEFT   POLYPECTOMY     REVERSE SHOULDER ARTHROPLASTY Right 07/12/2022   Procedure: REVERSE SHOULDER ARTHROPLASTY;  Surgeon: Huel Cote, MD;  Location: [facility];  Service: Orthopedics;  Laterality: Right;   RIGHT THUMB JOINT SURG.  09/2009   TRIGGER FINGER RELEASE  2010   RIGHT THUMB   UPPER GASTROINTESTINAL ENDOSCOPY     URETERAL STENT PLACEMENT  X2  2004  &  2009   LEFT   URETEROSCOPY  04/06/2011   Procedure: URETEROSCOPY;  Surgeon: Garnett Farm, MD;  Location: [facility];  Service: Urology;  Laterality: Left;  L Ureteroscopy Laser Litho & Stent    Patient Active Problem List   Diagnosis Date Noted   Anxiety 08/19/2022   Closed 3-part fracture of proximal end of right humerus 07/11/2022   Shoulder dislocation 07/11/2022   Abnormal stress test    Atypical chest pain 08/05/2021   Bradycardia 06/20/2021   Abnormal gait 03/17/2021   Acquired deformity of  lower leg 03/17/2021   Urinary urgency 11/14/2020   Paresthesia 08/15/2020   Right lumbar pain 08/15/2020   Tremor 08/15/2020   Hypoglycemia associated with diabetes (HCC) 02/29/2020   Essential (primary) hypertension 08/10/2019   Chronic neck pain 06/23/2016   Radicular low back pain 07/12/2015   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 without macular edema (HCC) 04/24/2015   Severe obesity (BMI >= 40) (HCC) 02/19/2015   OSA (obstructive sleep apnea) 10/23/2014   Excessive daytime sleepiness 10/23/2014   Hypothyroidism 09/07/2014   Tobacco abuse 01/15/2014   Recurrent UTI 09/06/2013   Orthostatic hypotension 10/05/2012   General medical examination 06/22/2011   Atrial tachycardia 11/25/2010   Pre-operative cardiovascular examination 11/25/2010   Chronic diarrhea 08/25/2010   KNEE PAIN 07/14/2010   Knee pain 07/14/2010   DIASTOLIC HEART FAILURE, CHRONIC 06/09/2010   PERSONAL HISTORY OF COLONIC POLYPS 05/01/2010   Vitamin D deficiency 01/20/2010   RESTLESS LEG SYNDROME 01/20/2010   Insomnia 01/20/2010   Restless leg syndrome 01/20/2010   Hyperlipidemia associated with type 2 diabetes mellitus (HCC) 01/08/2009   Anemia 01/08/2009   GERD 01/08/2009   Benign hypertensive heart disease with heart failure (HCC) 01/08/2009    PCP: Neena Rhymes MD   REFERRING PROVIDER: Huel Cote, MD  REFERRING DIAG: [phone] (ICD-10-CM) - Status post reverse arthroplasty of right shoulder  THERAPY DIAG:  Acute pain of right shoulder  Muscle weakness (generalized)  Stiffness of right shoulder, not elsewhere classified  Abnormal posture  Rationale for Evaluation and Treatment: Rehabilitation  ONSET DATE: Date of Surgery: 07/12/22  SUBJECTIVE:  SUBJECTIVE STATEMENT:  Last Saturday my shoulder was feeling good, now its more numb and hurting more in the shoulder too. I will take my dog out in the mornings but I will not go down the ramp because that's when I fell. Today is a good day.   Hand dominance: Right  PERTINENT HISTORY: 72 year old female is 2 weeks status post right shoulder reverse shoulder arthroplasty overall doing very well. Because this is a reverse for fracture I will asked that she remain in the sling for an additional 2 weeks. She may come out of this for passive range of motion at this time 1 month postop. That time she will begin following the reverse for fracture protocol. Will plan on outpatient physical therapy.  PAIN:  Are you having pain? Yes: NPRS scale: 2/10   Pain location: R wrist, more pain in the shoulder     Pain description: burning numbness but more pain in the shoulder itself  Aggravating factors: "I don't know but I think its the way I'm sleeping"  Relieving factors: going to sleep    PRECAUTIONS: Other: reverse total shoulder for fracture protocol   WEIGHT BEARING RESTRICTIONS: Yes NWB surgical LE   FALLS:  Has patient fallen in last 6 months? Yes. Number of falls 2- one fall on ramp in the rain that led to fracture/surgery, other fall was the night she came home from the hospital and just passed out; FOF (+)  LIVING ENVIRONMENT: Lives with: lives with their spouse Lives in: House/apartment Stairs: No Has following equipment at home: Single point cane, Environmental consultant - 2 wheeled, Crutches, Wheelchair (manual), bed side commode, and Ramped entry + walk-in shower, shower bench   OCCUPATION: Retired   PLOF: Independent, Independent with basic ADLs, Independent with gait, and Independent with transfers  PATIENT GOALS: move my arm, be able to wash dishes, brush teeth R handed- generally improve level of function    OBJECTIVE:   PATIENT SURVEYS:  EVAL: FOTO 32, predicted 57 08/25/22: FOTO  49  COGNITION: Overall cognitive status: Within functional limits for tasks assessed and very anxious about therapy      SENSATION: Reports numbness and burning in upper arm since surgery   POSTURE: Rounded shoulders, forward head, increased thoracic kyphosis   UPPER EXTREMITY ROM:   Passive ROM Right eval Right 08/11/22 Right 08/13/2022 Right 08/17/22 Right 08/21/2022 Right 08/25/22 Right 08/26/2022 Right 09/02/22  Shoulder flexion 45-50* P: 87 100 P: 110 Passive 110 AA: 136 110 PROM 120-130* supine, AAROM 120* supine    Shoulder extension          Shoulder scaption <20*       PROM 130-140*, 110* AAROM   Shoulder abduction  P: 78  P: 110  AA: 143 (Scaption)    Shoulder internal rotation   60  65  65   Shoulder external rotation -10* to -15* (edited 08/06/22, noted that I put ER value in IR column KEU) P: 20 30  35 AA: 35 40 PROM with arm at side 40-45*; AAROM 30-40*   Elbow flexion About 75% limited PROM, very guarded          Elbow extension WNL PROM but very guarded          (Blank rows = not tested)  UPPER EXTREMITY MMT:  MMT Right eval Left eval  Shoulder flexion    Shoulder extension    Shoulder abduction    Shoulder adduction    Shoulder internal rotation    Shoulder external  rotation    Middle trapezius    Lower trapezius    Elbow flexion    Elbow extension    Wrist flexion    Wrist extension    Wrist ulnar deviation    Wrist radial deviation    Wrist pronation    Wrist supination    Grip strength (lbs)    (Blank rows = not tested)- DNT at eval due to limitations in protocol    TODAY'S TREATMENT:                                                                                                                                         DATE:   09/04/22  TherEx  PROM/stretching for shoulder mobility/function - flexion, scaption, ER PROM within limits of protocol   AAROM for shoulder mobility and strength - flexion with PT assisting about 40% x10  - scaption  with PT assisting about 40% x10 - ER with PT assisting about 50% x10   Serratus punches 0# 2x10 AAROM 50% by PT  Thoracic 3D excursions x10 rotation and lateral flexion    09/02/22  TherEx  PROM/stretching for shoulder mobility/function: - flexion, scaption, ER PROM within limits of protocol  AAROM for shoulder mobility and strength with 1# bar - flexion with PT assisting 10-20% x10 -scaption with PT assisting 40-50* x10 - ER with PT assisting about 50% x10    08/31/22  TherEx  PROM/stretching for shoulder mobility and function: - flexion PROM to approximately 130-140* - scaption PROM to approximately 120-130*  - ER PROM to approximately 30* per protocol  Scap retraction x20 with 3 second holds   AAROM for shoulder mobility and strength: - flexion AAROM with 1# rod, MinA from PT for controlled lower  - abduction AAROM with PT assist about 50% sidelying to 70-80 degrees - ER AAROM with PT assist about 50% sidelying to no more than 30 degrees per protocol   08/28/22  TherEX  PROM/stretching for shoulder mobility and function: - flexion PROM to approximately 120* - scaption PROM to approximately 130*    AAROM for shoulder mobility and strength: - flexion AAROM to approximately 110*, PT assistance approximately 30-40% - scaption AAROM to approximately 130* PT assisted approximately 20-30% - ER AAROM at side to 30* PT assistance about 20-30%  Scapular retractions x20 with 3 second holds  Selfcare  Lots of reassurance and education on course of recovery, as well as general recovery from this intense of a fracture/surgery.    08/26/2022 Standing pendulums within comfortable range: side-to-side; forward and back and circles clockwise and counterclockwise 20 times each.  Try to get her as close to parallel to the floor as possible to do these.  Active assisted supine arm raises (scapular protraction with palm facing in and left side assisting) 3 sets of 10 for 3  seconds  Side lie ER (lie left, keep Rt elbow bent 90  degrees and rotate up as far as possible) 2 sets of 10 with PT assist  Shoulder blade pinches 10 for 5 seconds  Manual PROM right shoulder flexion, IR and ER to 40 degrees  Ice right shoulder 5 minutes post exercises   08/25/22 TherEx Scapular retraction x20 reps; seated AAROM measurements - see above for details Supine chest press 2 x 5 reps; PT assisting first set with decreasing assist and only needing eccentric assist on 2nd set Standing with Rt hand on counter and step back for PROM exercise - demonstrated for home   08/21/2022 Standing pendulums within comfortable range: side-to-side; forward and back and circles clockwise and counterclockwise 30 times each.  Try to get her as close to parallel to the floor as possible to do these.  Active assisted supine arm raises (scapular protraction with palm facing in and left side assisting) 2 sets of 10 for 3 seconds  Shoulder blade pinches 10 for 5 seconds  Pulley flexion with AAROM (PT assist) protraction (palm facing in) 10 reps 10 seconds  Manual PROM right shoulder flexion, IR and ER to 35 degrees  Ice right shoulder 5 minutes post exercises   PATIENT EDUCATION: Education details: exam findings, POC, HEP; extensive education on typical recovery after shoulder surgery, also benefit of taking pain medicine prior to PT to make skilled interventions more tolerable  Person educated: Patient Education method: Explanation, Demonstration, and Handouts Education comprehension: verbalized understanding, returned demonstration, and needs further education  HOME EXERCISE PROGRAM: Access Code: [email or web address] URL: [email or web address] Date: 08/26/2022 Prepared by: Pauletta Browns  Exercises - Seated Shoulder Pendulum Exercise  - 3-5 x daily - 7 x weekly - 1 sets - 20 reps - Gentle Horizontal Shoulder Pendulum  - 3-5 x daily - 7 x weekly - 1 sets - 20 reps - Gentle Vertical  Shoulder Pendulum  - 3-5 x daily - 7 x weekly - 1 sets - 20 reps - Standing Scapular Retraction  - 5 x daily - 7 x weekly - 1 sets - 5 reps - 5 second hold - Supine Scapular Protraction in Flexion with Dumbbells  - 2-3 x daily - 7 x weekly - 1 sets - 10-20 reps - 3 seconds hold - Sidelying Shoulder External Rotation Dumbbell  - 1-2 x daily - 7 x weekly - 2 sets - 10 reps - 1 hold  ASSESSMENT:  CLINICAL IMPRESSION:  Sumar arrives today doing well, starting to have a bit more shoulder pain now that we are moving it more and in greater ROM. That being said, seems to be making good improvements- able to complete AAROM more quickly through more ROM with less pain. Still limited by MD protocol/MD recommendations for safe progression- hopeful that we can progress challenge of exercise after next visit with surgeon.   OBJECTIVE IMPAIRMENTS: decreased ROM, decreased strength, hypomobility, increased edema, increased fascial restrictions, increased muscle spasms, impaired flexibility, impaired UE functional use, postural dysfunction, obesity, and pain.   ACTIVITY LIMITATIONS: carrying, lifting, bed mobility, bathing, toileting, dressing, reach over head, and caring for others  PARTICIPATION LIMITATIONS: cleaning, laundry, driving, shopping, community activity, and yard work  PERSONAL FACTORS: Age, Behavior pattern, Education, Fitness, Past/current experiences, Sex, Social background, and Time since onset of injury/illness/exacerbation are also affecting patient's functional outcome.   REHAB POTENTIAL: Good  CLINICAL DECISION MAKING: Stable/uncomplicated  EVALUATION COMPLEXITY: Low   GOALS: Goals reviewed with patient? Yes  SHORT TERM GOALS: Target date: 09/02/2022   Will be compliant with appropriate progressive HEP  Baseline: Goal status: Met 08/21/2022  2.  R shoulder flexion and scaption PROM to be at least 100 degrees, R shoulder ER PROM to be at least 20 degrees at 0* ABD  Baseline:  Goal  status: Met 08/13/2022  3.  R shoulder flexion and scaption AAROM to be at least 90 degrees, R shoulder ER AAROM to reach neutral  Baseline:  Goal status: MET 08/25/2022   4.  Will demonstrate better functional posture/postural mechanics  Baseline:  Goal status: On Going 08/25/2022   LONG TERM GOALS: Target date: 09/30/2022    R shoulder flexion and scaption AROM to be at least 120 degrees  Baseline:  Goal status: On Going 08/25/2022  2.  R shoulder ER AROM to be at least 30 degrees at 45* ABD  Baseline:  Goal status: On Going 08/25/2022  3.  Will be able to reach L5 level with active FIR no increased pain  Baseline:  Goal status: Ongoing 08/25/22  4.  Will be able to perform all dressing/bathing tasks on a Mod(I) basis no increase in pain  Baseline:  Goal status: Ongoing 08/25/22  5.  Will be able to reach overhead to put dishes in the cabinet without increase in pain  Baseline:  Goal status: Ongoing 08/25/22   PLAN:  PT FREQUENCY: 3x/week  PT DURATION: 8 weeks  PLANNED INTERVENTIONS: Therapeutic exercises, Therapeutic activity, Neuromuscular re-education, Balance training, Gait training, Patient/Family education, Self Care, Joint mobilization, Aquatic Therapy, Dry Needling, Electrical stimulation, Cryotherapy, Moist heat, Taping, Ultrasound, Ionotophoresis 4mg /ml Dexamethasone, Manual therapy, and Re-evaluation  PLAN FOR NEXT SESSION:  PROM, AAROM and appropriate AROM for flexion, IR and limited range ER.  Early phase scapular strength/functional work (without external resistance).  Progress per Dr. Serena Croissant protocol.  Hold on pulleys for now due to pain. Now at 7 weeks post-op  NEXT MD VISIT: 09/11/22  Nedra Hai PT DPT PN2

## 2022-09-07 ENCOUNTER — Encounter: Payer: Self-pay | Admitting: Physical Therapy

## 2022-09-07 ENCOUNTER — Other Ambulatory Visit: Payer: Self-pay | Admitting: Acute Care

## 2022-09-07 ENCOUNTER — Ambulatory Visit: Payer: Medicare HMO | Admitting: Physical Therapy

## 2022-09-07 DIAGNOSIS — M25611 Stiffness of right shoulder, not elsewhere classified: Secondary | ICD-10-CM

## 2022-09-07 DIAGNOSIS — Z87891 Personal history of nicotine dependence: Secondary | ICD-10-CM

## 2022-09-07 DIAGNOSIS — M6281 Muscle weakness (generalized): Secondary | ICD-10-CM

## 2022-09-07 DIAGNOSIS — F17210 Nicotine dependence, cigarettes, uncomplicated: Secondary | ICD-10-CM

## 2022-09-07 DIAGNOSIS — Z122 Encounter for screening for malignant neoplasm of respiratory organs: Secondary | ICD-10-CM

## 2022-09-07 DIAGNOSIS — R293 Abnormal posture: Secondary | ICD-10-CM | POA: Diagnosis not present

## 2022-09-07 DIAGNOSIS — M25511 Pain in right shoulder: Secondary | ICD-10-CM

## 2022-09-07 NOTE — Therapy (Signed)
OUTPATIENT PHYSICAL THERAPY SHOULDER TREATMENT NOTE  Patient Name: Lacey [name] MRN: [id number] DOB:[birth year], 72 y.o., female Today's Date: 09/07/2022  END OF SESSION:  PT End of Session - 09/07/22 1212     Visit Number 16    Number of Visits 25    Date for PT Re-Evaluation 09/30/22    Authorization Type [employer] MCR    Authorization Time Period 08/05/22 to 09/30/22    Progress Note Due on Visit 20    PT Start Time 1148    PT Stop Time 1227    PT Time Calculation (min) 39 min    Activity Tolerance Patient tolerated treatment well    Behavior During Therapy WFL for tasks assessed/performed                  Past Medical History:  Diagnosis Date   Adenomatous colon polyp    hyperplastic   Anemia    Anginal pain (HCC)    not recent   Arthritis    NECK, KNEES, FINGERS, TOES   Atrial tachycardia CARDIOLOGIST - DR Graciela Husbands (LAST VISIT AUG 2012)   Echo 12/11: EF 55-60%, Mild LVH, grade 1 diast dysfxn;   holter 1/12: ATach   Atypical chest pain    a. 07/2004 Cath: Clean cors;  b. 04/2010 Myoview: EF 63%, no ischemia   Blood transfusion without reported diagnosis 1969   CHF (congestive heart failure) (HCC)    Chronic kidney disease    left kidney small    Coronary artery disease    Diabetes mellitus, type 2 (HCC)    ORAL AND INSULIN MEDS (LAST A1C  7.3)   Diverticulosis    Erythema CURRENT-- CLOSED ABD. WALL ABSCESS   PER PCP NOTE (03-31-11)- MRSA-- TAKES DOXYCYCLINE   GERD (gastroesophageal reflux disease)    CONTROLLED W/ PREVACID   History of kidney stones 2012   Hyperlipidemia    Hypertension    Insomnia    TAKES MEDS PRN   Neuropathy, peripheral    Obesity (BMI 30-39.9) 02/19/2015   Pneumonia    as child   Post op infection 11/07/2012   left bunionectomy   Restless leg syndrome    Sepsis, urinary HISTORY - 2004   Sleep apnea    no cpap    Thyroid disease    TIA (transient ischemic attack)    "mini strokes" 2023   Tremor    Vitamin D deficiency    Past  Surgical History:  Procedure Laterality Date   ABDOMINAL HYSTERECTOMY  1995   ovaries remain   BUNIONECTOMY Left 10/24/2012   BUNIONECTOMY Right 05/17/2017   CARDIAC CATHETERIZATION  07/10/2004   CARPAL TUNNEL RELEASE  2000   RIGHT   CATARACT EXTRACTION, BILATERAL     CERVICAL FUSION  10/12/2007   C5 - 7   COLONOSCOPY     CYSTOSCOPY WITH RETROGRADE PYELOGRAM, URETEROSCOPY AND STENT PLACEMENT Left 11/28/2012   Procedure: LEFT RETROGRADE PYELOGRAM, LEFT URETEROSCOPY, ;  Surgeon: Garnett Farm, MD;  Location: [facility];  Service: Urology;  Laterality: Left;   CYSTOSCOPY/RETROGRADE/URETEROSCOPY/STONE EXTRACTION WITH BASKET  X2 2004 & 2009   LEFT    GASTRIC BYPASS  1981   KNEE ARTHROSCOPY  12/2010   LEFT   LAPAROSCOPIC CHOLECYSTECTOMY  2001   LEFT HEART CATH AND CORONARY ANGIOGRAPHY N/A 12/03/2021   Procedure: LEFT HEART CATH AND CORONARY ANGIOGRAPHY;  Surgeon: Corky Crafts, MD;  Location: [facility] INVASIVE CV LAB;  Service: Cardiovascular;  Laterality: N/A;   left thumb joint surgery  2013   PERCUTANEOUS NEPHROSTOLITHOTOMY  02/27/2011   LEFT   POLYPECTOMY     REVERSE SHOULDER ARTHROPLASTY Right 07/12/2022   Procedure: REVERSE SHOULDER ARTHROPLASTY;  Surgeon: Huel Cote, MD;  Location: [facility];  Service: Orthopedics;  Laterality: Right;   RIGHT THUMB JOINT SURG.  09/2009   TRIGGER FINGER RELEASE  2010   RIGHT THUMB   UPPER GASTROINTESTINAL ENDOSCOPY     URETERAL STENT PLACEMENT  X2  2004  &  2009   LEFT   URETEROSCOPY  04/06/2011   Procedure: URETEROSCOPY;  Surgeon: Garnett Farm, MD;  Location: [facility];  Service: Urology;  Laterality: Left;  L Ureteroscopy Laser Litho & Stent    Patient Active Problem List   Diagnosis Date Noted   Anxiety 08/19/2022   Closed 3-part fracture of proximal end of right humerus 07/11/2022   Shoulder dislocation 07/11/2022   Abnormal stress test    Atypical chest pain 08/05/2021   Bradycardia 06/20/2021   Abnormal gait  03/17/2021   Acquired deformity of lower leg 03/17/2021   Urinary urgency 11/14/2020   Paresthesia 08/15/2020   Right lumbar pain 08/15/2020   Tremor 08/15/2020   Hypoglycemia associated with diabetes (HCC) 02/29/2020   Essential (primary) hypertension 08/10/2019   Chronic neck pain 06/23/2016   Radicular low back pain 07/12/2015   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 without macular edema (HCC) 04/24/2015   Severe obesity (BMI >= 40) (HCC) 02/19/2015   OSA (obstructive sleep apnea) 10/23/2014   Excessive daytime sleepiness 10/23/2014   Hypothyroidism 09/07/2014   Tobacco abuse 01/15/2014   Recurrent UTI 09/06/2013   Orthostatic hypotension 10/05/2012   General medical examination 06/22/2011   Atrial tachycardia 11/25/2010   Pre-operative cardiovascular examination 11/25/2010   Chronic diarrhea 08/25/2010   KNEE PAIN 07/14/2010   Knee pain 07/14/2010   DIASTOLIC HEART FAILURE, CHRONIC 06/09/2010   PERSONAL HISTORY OF COLONIC POLYPS 05/01/2010   Vitamin D deficiency 01/20/2010   RESTLESS LEG SYNDROME 01/20/2010   Insomnia 01/20/2010   Restless leg syndrome 01/20/2010   Hyperlipidemia associated with type 2 diabetes mellitus (HCC) 01/08/2009   Anemia 01/08/2009   GERD 01/08/2009   Benign hypertensive heart disease with heart failure (HCC) 01/08/2009    PCP: Neena Rhymes MD   REFERRING PROVIDER: Huel Cote, MD  REFERRING DIAG: [phone] (ICD-10-CM) - Status post reverse arthroplasty of right shoulder  THERAPY DIAG:  Acute pain of right shoulder  Muscle weakness (generalized)  Stiffness of right shoulder, not elsewhere classified  Abnormal posture  Rationale for Evaluation and Treatment: Rehabilitation  ONSET DATE: Date of Surgery: 07/12/22  SUBJECTIVE:  SUBJECTIVE STATEMENT:  Doing OK, Saturday was OK but Sunday I really had a lot of pain, it feels better again today. Having more pain around collarbone not sure why, maybe its from how I'm laying.   Hand dominance: Right  PERTINENT HISTORY: 72 year old female is 2 weeks status post right shoulder reverse shoulder arthroplasty overall doing very well. Because this is a reverse for fracture I will asked that she remain in the sling for an additional 2 weeks. She may come out of this for passive range of motion at this time 1 month postop. That time she will begin following the reverse for fracture protocol. Will plan on outpatient physical therapy.  PAIN:  Are you having pain? Yes: NPRS scale: 2-3/10   Pain location: R shoulder and around R collar bone    Pain description:  unclear  Aggravating factors: unclear   Relieving factors: unclear     PRECAUTIONS: Other: reverse total shoulder for fracture protocol   WEIGHT BEARING RESTRICTIONS: Yes NWB surgical LE   FALLS:  Has patient fallen in last 6 months? Yes. Number of falls 2- one fall on ramp in the rain that led to fracture/surgery, other fall was the night she came home from the hospital and just passed out; FOF (+)  LIVING ENVIRONMENT: Lives with: lives with their spouse Lives in: House/apartment Stairs: No Has following equipment at home: Single point cane, Environmental consultant - 2 wheeled, Crutches, Wheelchair (manual), bed side commode, and Ramped entry + walk-in shower, shower bench   OCCUPATION: Retired   PLOF: Independent, Independent with basic ADLs, Independent with gait, and Independent with transfers  PATIENT GOALS: move my arm, be able to wash dishes, brush teeth R handed- generally improve level of function    OBJECTIVE:   PATIENT SURVEYS:  EVAL: FOTO 32, predicted 57 08/25/22: FOTO 49  COGNITION: Overall cognitive status: Within functional limits for tasks assessed and very anxious about therapy       SENSATION: Reports numbness and burning in upper arm since surgery   POSTURE: Rounded shoulders, forward head, increased thoracic kyphosis   UPPER EXTREMITY ROM:   Passive ROM Right eval Right 08/11/22 Right 08/13/2022 Right 08/17/22 Right 08/21/2022 Right 08/25/22 Right 08/26/2022 Right 09/02/22  Shoulder flexion 45-50* P: 87 100 P: 110 Passive 110 AA: 136 110 PROM 120-130* supine, AAROM 120* supine    Shoulder extension          Shoulder scaption <20*       PROM 130-140*, 110* AAROM   Shoulder abduction  P: 78  P: 110  AA: 143 (Scaption)    Shoulder internal rotation   60  65  65   Shoulder external rotation -10* to -15* (edited 08/06/22, noted that I put ER value in IR column KEU) P: 20 30  35 AA: 35 40 PROM with arm at side 40-45*; AAROM 30-40*   Elbow flexion About 75% limited PROM, very guarded          Elbow extension WNL PROM but very guarded          (Blank rows = not tested)  UPPER EXTREMITY MMT:  MMT Right eval Left eval  Shoulder flexion    Shoulder extension    Shoulder abduction    Shoulder adduction    Shoulder internal rotation    Shoulder external rotation    Middle trapezius    Lower trapezius    Elbow flexion    Elbow extension    Wrist flexion    Wrist  extension    Wrist ulnar deviation    Wrist radial deviation    Wrist pronation    Wrist supination    Grip strength (lbs)    (Blank rows = not tested)- DNT at eval due to limitations in protocol    TODAY'S TREATMENT:                                                                                                                                         DATE:    09/07/22   TherEx  PROM/stretching for shoulder mobility/function - flexion, scaption, ER PROM within limits of protocol   AAROM for shoulder mobility and strength - flexion with PT assisting about 40% x10  - scaption with PT assisting about 40% x10 - ER with PT assisting about 50% x10 (sidelying with elbow tucked to side) -  sidelying ABD with PT assist 60% x10  Thoracic lateral flexion and rotations x10 B   09/04/22  TherEx  PROM/stretching for shoulder mobility/function - flexion, scaption, ER PROM within limits of protocol   AAROM for shoulder mobility and strength - flexion with PT assisting about 40% x10  - scaption with PT assisting about 40% x10 - ER with PT assisting about 50% x10   Serratus punches 0# 2x10 AAROM 50% by PT  Thoracic 3D excursions x10 rotation and lateral flexion    09/02/22  TherEx  PROM/stretching for shoulder mobility/function: - flexion, scaption, ER PROM within limits of protocol  AAROM for shoulder mobility and strength with 1# bar - flexion with PT assisting 10-20% x10 -scaption with PT assisting 40-50* x10 - ER with PT assisting about 50% x10    08/31/22  TherEx  PROM/stretching for shoulder mobility and function: - flexion PROM to approximately 130-140* - scaption PROM to approximately 120-130*  - ER PROM to approximately 30* per protocol  Scap retraction x20 with 3 second holds   AAROM for shoulder mobility and strength: - flexion AAROM with 1# rod, MinA from PT for controlled lower  - abduction AAROM with PT assist about 50% sidelying to 70-80 degrees - ER AAROM with PT assist about 50% sidelying to no more than 30 degrees per protocol   08/28/22  TherEX  PROM/stretching for shoulder mobility and function: - flexion PROM to approximately 120* - scaption PROM to approximately 130*    AAROM for shoulder mobility and strength: - flexion AAROM to approximately 110*, PT assistance approximately 30-40% - scaption AAROM to approximately 130* PT assisted approximately 20-30% - ER AAROM at side to 30* PT assistance about 20-30%  Scapular retractions x20 with 3 second holds  Selfcare  Lots of reassurance and education on course of recovery, as well as general recovery from this intense of a fracture/surgery.    08/26/2022 Standing  pendulums within comfortable range: side-to-side; forward and back and circles clockwise and counterclockwise 20 times each.  Try to get her  as close to parallel to the floor as possible to do these.  Active assisted supine arm raises (scapular protraction with palm facing in and left side assisting) 3 sets of 10 for 3 seconds  Side lie ER (lie left, keep Rt elbow bent 90 degrees and rotate up as far as possible) 2 sets of 10 with PT assist  Shoulder blade pinches 10 for 5 seconds  Manual PROM right shoulder flexion, IR and ER to 40 degrees  Ice right shoulder 5 minutes post exercises   08/25/22 TherEx Scapular retraction x20 reps; seated AAROM measurements - see above for details Supine chest press 2 x 5 reps; PT assisting first set with decreasing assist and only needing eccentric assist on 2nd set Standing with Rt hand on counter and step back for PROM exercise - demonstrated for home   08/21/2022 Standing pendulums within comfortable range: side-to-side; forward and back and circles clockwise and counterclockwise 30 times each.  Try to get her as close to parallel to the floor as possible to do these.  Active assisted supine arm raises (scapular protraction with palm facing in and left side assisting) 2 sets of 10 for 3 seconds  Shoulder blade pinches 10 for 5 seconds  Pulley flexion with AAROM (PT assist) protraction (palm facing in) 10 reps 10 seconds  Manual PROM right shoulder flexion, IR and ER to 35 degrees  Ice right shoulder 5 minutes post exercises   PATIENT EDUCATION: Education details: exam findings, POC, HEP; extensive education on typical recovery after shoulder surgery, also benefit of taking pain medicine prior to PT to make skilled interventions more tolerable  Person educated: Patient Education method: Explanation, Demonstration, and Handouts Education comprehension: verbalized understanding, returned demonstration, and needs further education  HOME  EXERCISE PROGRAM: Access Code: [email or web address] URL: [email or web address] Date: 08/26/2022 Prepared by: Pauletta Browns  Exercises - Seated Shoulder Pendulum Exercise  - 3-5 x daily - 7 x weekly - 1 sets - 20 reps - Gentle Horizontal Shoulder Pendulum  - 3-5 x daily - 7 x weekly - 1 sets - 20 reps - Gentle Vertical Shoulder Pendulum  - 3-5 x daily - 7 x weekly - 1 sets - 20 reps - Standing Scapular Retraction  - 5 x daily - 7 x weekly - 1 sets - 5 reps - 5 second hold - Supine Scapular Protraction in Flexion with Dumbbells  - 2-3 x daily - 7 x weekly - 1 sets - 10-20 reps - 3 seconds hold - Sidelying Shoulder External Rotation Dumbbell  - 1-2 x daily - 7 x weekly - 2 sets - 10 reps - 1 hold  ASSESSMENT:  CLINICAL IMPRESSION:  Lakiska arrives today doing well, very talkative and perseverated on sleeping position, needed some redirection towards goals of session today. Continued working on general PROM/AAROM today as tolerated and time allowed. Will need progress note and likely recert/additional visits next session, sees MD on the 10th. Hopeful we can progress her a bit more after this point.   OBJECTIVE IMPAIRMENTS: decreased ROM, decreased strength, hypomobility, increased edema, increased fascial restrictions, increased muscle spasms, impaired flexibility, impaired UE functional use, postural dysfunction, obesity, and pain.   ACTIVITY LIMITATIONS: carrying, lifting, bed mobility, bathing, toileting, dressing, reach over head, and caring for others  PARTICIPATION LIMITATIONS: cleaning, laundry, driving, shopping, community activity, and yard work  PERSONAL FACTORS: Age, Behavior pattern, Education, Fitness, Past/current experiences, Sex, Social background, and Time since onset of injury/illness/exacerbation are also affecting patient's functional outcome.   REHAB  POTENTIAL: Good  CLINICAL DECISION MAKING: Stable/uncomplicated  EVALUATION COMPLEXITY: Low   GOALS: Goals reviewed  with patient? Yes  SHORT TERM GOALS: Target date: 09/02/2022   Will be compliant with appropriate progressive HEP  Baseline: Goal status: Met 08/21/2022  2.  R shoulder flexion and scaption PROM to be at least 100 degrees, R shoulder ER PROM to be at least 20 degrees at 0* ABD  Baseline:  Goal status: Met 08/13/2022  3.  R shoulder flexion and scaption AAROM to be at least 90 degrees, R shoulder ER AAROM to reach neutral  Baseline:  Goal status: MET 08/25/2022   4.  Will demonstrate better functional posture/postural mechanics  Baseline:  Goal status: On Going 08/25/2022   LONG TERM GOALS: Target date: 09/30/2022    R shoulder flexion and scaption AROM to be at least 120 degrees  Baseline:  Goal status: On Going 08/25/2022  2.  R shoulder ER AROM to be at least 30 degrees at 45* ABD  Baseline:  Goal status: On Going 08/25/2022  3.  Will be able to reach L5 level with active FIR no increased pain  Baseline:  Goal status: Ongoing 08/25/22  4.  Will be able to perform all dressing/bathing tasks on a Mod(I) basis no increase in pain  Baseline:  Goal status: Ongoing 08/25/22  5.  Will be able to reach overhead to put dishes in the cabinet without increase in pain  Baseline:  Goal status: Ongoing 08/25/22   PLAN:  PT FREQUENCY: 3x/week  PT DURATION: 8 weeks  PLANNED INTERVENTIONS: Therapeutic exercises, Therapeutic activity, Neuromuscular re-education, Balance training, Gait training, Patient/Family education, Self Care, Joint mobilization, Aquatic Therapy, Dry Needling, Electrical stimulation, Cryotherapy, Moist heat, Taping, Ultrasound, Ionotophoresis 4mg /ml Dexamethasone, Manual therapy, and Re-evaluation  PLAN FOR NEXT SESSION:  PROM, AAROM and appropriate AROM for flexion, IR and limited range ER.  Early phase scapular strength/functional work (without external resistance).  Progress per Dr. Serena Croissant protocol.  Hold on pulleys for now due to pain. Now at 8 weeks post-op,  needs progress note/POC extension/additional visits scheduled next visit prior to MD appt   NEXT MD VISIT: 09/11/22  Nedra Hai PT DPT PN2

## 2022-09-10 ENCOUNTER — Encounter: Payer: Self-pay | Admitting: Rehabilitative and Restorative Service Providers"

## 2022-09-10 ENCOUNTER — Encounter: Payer: Self-pay | Admitting: Family Medicine

## 2022-09-10 ENCOUNTER — Ambulatory Visit (INDEPENDENT_AMBULATORY_CARE_PROVIDER_SITE_OTHER): Payer: Medicare HMO | Admitting: Rehabilitative and Restorative Service Providers"

## 2022-09-10 DIAGNOSIS — R293 Abnormal posture: Secondary | ICD-10-CM | POA: Diagnosis not present

## 2022-09-10 DIAGNOSIS — M6281 Muscle weakness (generalized): Secondary | ICD-10-CM

## 2022-09-10 DIAGNOSIS — M25511 Pain in right shoulder: Secondary | ICD-10-CM

## 2022-09-10 DIAGNOSIS — M25611 Stiffness of right shoulder, not elsewhere classified: Secondary | ICD-10-CM | POA: Diagnosis not present

## 2022-09-10 NOTE — Therapy (Signed)
OUTPATIENT PHYSICAL THERAPY SHOULDER TREATMENT/PROGRESS NOTE  Patient Name: Lacey [name] MRN: [id number] DOB:10-Apr-1951, 72 y.o., female Today's Date: 09/10/2022  END OF SESSION:  PT End of Session - 09/10/22 1551     Visit Number 17    Number of Visits 25    Date for PT Re-Evaluation 10/09/22    Authorization Type [employer] [facility]    Authorization Time Period 08/05/22 to 09/30/22    Authorization - Visit Number 17    Progress Note Due on Visit 20    PT Start Time 0931    PT Stop Time 1015    PT Time Calculation (min) 44 min    Activity Tolerance Patient tolerated treatment well;No increased pain;Patient limited by fatigue    Behavior During Therapy [facility] for tasks assessed/performed            Progress Note Reporting Period 08/05/2022 to 09/10/2022  See note below for Objective Data and Assessment of Progress/Goals.    Past Medical History:  Diagnosis Date   Adenomatous colon polyp    hyperplastic   Anemia    Anginal pain (HCC)    not recent   Arthritis    NECK, KNEES, FINGERS, TOES   Atrial tachycardia CARDIOLOGIST - DR Graciela Husbands (LAST VISIT AUG 2012)   Echo 12/11: EF 55-60%, Mild LVH, grade 1 diast dysfxn;   holter 1/12: ATach   Atypical chest pain    a. 07/2004 Cath: Clean cors;  b. 04/2010 Myoview: EF 63%, no ischemia   Blood transfusion without reported diagnosis 1969   CHF (congestive heart failure) (HCC)    Chronic kidney disease    left kidney small    Coronary artery disease    Diabetes mellitus, type 2 (HCC)    ORAL AND INSULIN MEDS (LAST A1C  7.3)   Diverticulosis    Erythema CURRENT-- CLOSED ABD. WALL ABSCESS   PER PCP NOTE (03-31-11)- MRSA-- TAKES DOXYCYCLINE   GERD (gastroesophageal reflux disease)    CONTROLLED W/ PREVACID   History of kidney stones 2012   Hyperlipidemia    Hypertension    Insomnia    TAKES MEDS PRN   Neuropathy, peripheral    Obesity (BMI 30-39.9) 02/19/2015   Pneumonia    as child   Post op infection 11/07/2012   left bunionectomy    Restless leg syndrome    Sepsis, urinary HISTORY - 2004   Sleep apnea    no cpap    Thyroid disease    TIA (transient ischemic attack)    "mini strokes" 2023   Tremor    Vitamin D deficiency    Past Surgical History:  Procedure Laterality Date   ABDOMINAL HYSTERECTOMY  1995   ovaries remain   BUNIONECTOMY Left 10/24/2012   BUNIONECTOMY Right 05/17/2017   CARDIAC CATHETERIZATION  07/10/2004   CARPAL TUNNEL RELEASE  2000   RIGHT   CATARACT EXTRACTION, BILATERAL     CERVICAL FUSION  10/12/2007   C5 - 7   COLONOSCOPY     CYSTOSCOPY WITH RETROGRADE PYELOGRAM, URETEROSCOPY AND STENT PLACEMENT Left 11/28/2012   Procedure: LEFT RETROGRADE PYELOGRAM, LEFT URETEROSCOPY, ;  Surgeon: Garnett Farm, MD;  Location: [facility];  Service: Urology;  Laterality: Left;   CYSTOSCOPY/RETROGRADE/URETEROSCOPY/STONE EXTRACTION WITH BASKET  X2 2004 & 2009   LEFT    GASTRIC BYPASS  1981   KNEE ARTHROSCOPY  12/2010   LEFT   LAPAROSCOPIC CHOLECYSTECTOMY  2001   LEFT HEART CATH AND CORONARY ANGIOGRAPHY N/A 12/03/2021   Procedure: LEFT HEART  CATH AND CORONARY ANGIOGRAPHY;  Surgeon: Corky Crafts, MD;  Location: [facility] INVASIVE CV LAB;  Service: Cardiovascular;  Laterality: N/A;   left thumb joint surgery  2013   PERCUTANEOUS NEPHROSTOLITHOTOMY  02/27/2011   LEFT   POLYPECTOMY     REVERSE SHOULDER ARTHROPLASTY Right 07/12/2022   Procedure: REVERSE SHOULDER ARTHROPLASTY;  Surgeon: Huel Cote, MD;  Location: [facility] OR;  Service: Orthopedics;  Laterality: Right;   RIGHT THUMB JOINT SURG.  09/2009   TRIGGER FINGER RELEASE  2010   RIGHT THUMB   UPPER GASTROINTESTINAL ENDOSCOPY     URETERAL STENT PLACEMENT  X2  2004  &  2009   LEFT   URETEROSCOPY  04/06/2011   Procedure: URETEROSCOPY;  Surgeon: Garnett Farm, MD;  Location: [facility];  Service: Urology;  Laterality: Left;  L Ureteroscopy Laser Litho & Stent    Patient Active Problem List   Diagnosis Date Noted   Anxiety 08/19/2022    Closed 3-part fracture of proximal end of right humerus 07/11/2022   Shoulder dislocation 07/11/2022   Abnormal stress test    Atypical chest pain 08/05/2021   Bradycardia 06/20/2021   Abnormal gait 03/17/2021   Acquired deformity of lower leg 03/17/2021   Urinary urgency 11/14/2020   Paresthesia 08/15/2020   Right lumbar pain 08/15/2020   Tremor 08/15/2020   Hypoglycemia associated with diabetes (HCC) 02/29/2020   Essential (primary) hypertension 08/10/2019   Chronic neck pain 06/23/2016   Radicular low back pain 07/12/2015   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 without macular edema (HCC) 04/24/2015   Severe obesity (BMI >= 40) (HCC) 02/19/2015   OSA (obstructive sleep apnea) 10/23/2014   Excessive daytime sleepiness 10/23/2014   Hypothyroidism 09/07/2014   Tobacco abuse 01/15/2014   Recurrent UTI 09/06/2013   Orthostatic hypotension 10/05/2012   General medical examination 06/22/2011   Atrial tachycardia 11/25/2010   Pre-operative cardiovascular examination 11/25/2010   Chronic diarrhea 08/25/2010   KNEE PAIN 07/14/2010   Knee pain 07/14/2010   DIASTOLIC HEART FAILURE, CHRONIC 06/09/2010   PERSONAL HISTORY OF COLONIC POLYPS 05/01/2010   Vitamin D deficiency 01/20/2010   RESTLESS LEG SYNDROME 01/20/2010   Insomnia 01/20/2010   Restless leg syndrome 01/20/2010   Hyperlipidemia associated with type 2 diabetes mellitus (HCC) 01/08/2009   Anemia 01/08/2009   GERD 01/08/2009   Benign hypertensive heart disease with heart failure (HCC) 01/08/2009    PCP: Neena Rhymes MD   REFERRING PROVIDER: Huel Cote, MD  REFERRING DIAG: [phone] (ICD-10-CM) - Status post reverse arthroplasty of right shoulder  THERAPY DIAG:  Acute pain of right shoulder - Plan: PT plan of care cert/re-cert  Muscle weakness (generalized) - Plan: PT plan of care cert/re-cert  Stiffness of right shoulder, not elsewhere classified - Plan: PT  plan of care cert/re-cert  Abnormal posture - Plan: PT plan of care cert/re-cert  Rationale for Evaluation and Treatment: Rehabilitation  ONSET DATE: Date of Surgery: 07/12/22  SUBJECTIVE:  SUBJECTIVE STATEMENT:  Manreet reports HEP compliance.  2X/day with activities performed today is recommended.  Hand dominance: Right  PERTINENT HISTORY: 72 year old female is 2 weeks status post right shoulder reverse shoulder arthroplasty overall doing very well. Because this is a reverse for fracture I will asked that she remain in the sling for an additional 2 weeks. She may come out of this for passive range of motion at this time 1 month postop. That time she will begin following the reverse for fracture protocol. Will plan on outpatient physical therapy.  PAIN:  Are you having pain? Yes: NPRS scale: 2-3/10   Pain location: R shoulder and around R collar bone    Pain description:  unclear  Aggravating factors: unclear   Relieving factors: unclear     PRECAUTIONS: Other: reverse total shoulder for fracture protocol   WEIGHT BEARING RESTRICTIONS: Yes NWB surgical LE   FALLS:  Has patient fallen in last 6 months? Yes. Number of falls 2- one fall on ramp in the rain that led to fracture/surgery, other fall was the night she came home from the hospital and just passed out; FOF (+)  LIVING ENVIRONMENT: Lives with: lives with their spouse Lives in: House/apartment Stairs: No Has following equipment at home: Single point cane, Environmental consultant - 2 wheeled, Crutches, Wheelchair (manual), bed side commode, and Ramped entry + walk-in shower, shower bench   OCCUPATION: Retired   PLOF: Independent, Independent with basic ADLs, Independent with gait, and Independent with transfers  PATIENT GOALS: move my arm, be able to wash  dishes, brush teeth R handed- generally improve level of function    OBJECTIVE:   PATIENT SURVEYS:  09/10/2022 FOTO 57 (Goal met)  08/25/22 FOTO 49  EVAL: FOTO 32, predicted 57  COGNITION: Overall cognitive status: Within functional limits for tasks assessed and very anxious about therapy      SENSATION: Reports numbness and burning in upper arm since surgery   POSTURE: Rounded shoulders, forward head, increased thoracic kyphosis   UPPER EXTREMITY ROM:   Passive ROM Right eval Right 08/11/22 Right 08/13/2022 Right 08/17/22 Right 08/21/2022 Right 08/25/22 Right 08/26/2022 Right 09/02/22 Left/Right 09/10/2022 assessed supine   Shoulder flexion 45-50* P: 87 100 P: 110 Passive 110 AA: 136 110 PROM 120-130* supine, AAROM 120* supine   150/130   Shoulder extension           Shoulder scaption <20*       PROM 130-140*, 110* AAROM    Shoulder abduction  P: 78  P: 110  AA: 143 (Scaption)     Shoulder internal rotation   60  65  65  95/65  Shoulder external rotation -10* to -15* (edited 08/06/22, noted that I put ER value in IR column KEU) P: 20 30  35 AA: 35 40 PROM with arm at side 40-45*; AAROM 30-40*  60/50  Elbow flexion About 75% limited PROM, very guarded           Elbow extension WNL PROM but very guarded           (Blank rows = not tested)  UPPER EXTREMITY STRENGTH:  In pounds with hand-held dynamometer Right 09/10/2022 Left 09/10/2022  Shoulder flexion    Shoulder extension    Shoulder abduction    Shoulder adduction    Shoulder internal rotation 8.5 23.8  Shoulder external rotation < 3 pounds 18.5  Middle trapezius    Lower trapezius    Elbow flexion    Elbow extension    Wrist  flexion    Wrist extension    Wrist ulnar deviation    Wrist radial deviation    Wrist pronation    Wrist supination    Grip strength (lbs)    (Blank rows = not tested)- DNT at eval due to limitations in protocol    TODAY'S TREATMENT:                                                                                                                                          DATE:  09/10/2022 Active assisted supine arm raises (scapular protraction with palm facing in and left side assisting) 3 sets of 20 for 3 seconds  Side lie ER (lie left, keep Rt elbow bent 90 degrees and rotate up as far as possible) 2 sets of 10 with PT assist  Shoulder blade pinches 10 for 5 seconds  Supine active assisted shoulder flexion (left side helps) 10X 10 seconds  Supine active shoulder ER 10X 10 seconds   09/07/22 TherEx  PROM/stretching for shoulder mobility/function - flexion, scaption, ER PROM within limits of protocol   AAROM for shoulder mobility and strength - flexion with PT assisting about 40% x10  - scaption with PT assisting about 40% x10 - ER with PT assisting about 50% x10 (sidelying with elbow tucked to side) - sidelying ABD with PT assist 60% x10  Thoracic lateral flexion and rotations x10 B   09/04/22  TherEx  PROM/stretching for shoulder mobility/function - flexion, scaption, ER PROM within limits of protocol   AAROM for shoulder mobility and strength - flexion with PT assisting about 40% x10  - scaption with PT assisting about 40% x10 - ER with PT assisting about 50% x10   Serratus punches 0# 2x10 AAROM 50% by PT  Thoracic 3D excursions x10 rotation and lateral flexion    PATIENT EDUCATION: Education details: exam findings, POC, HEP; extensive education on typical recovery after shoulder surgery, also benefit of taking pain medicine prior to PT to make skilled interventions more tolerable  Person educated: Patient Education method: Explanation, Demonstration, and Handouts Education comprehension: verbalized understanding, returned demonstration, and needs further education  HOME EXERCISE PROGRAM: Access Code: [email or web address] URL: [email or web address] Date: 08/26/2022 Prepared by: Pauletta Browns  Exercises - Seated Shoulder Pendulum Exercise  - 3-5 x  daily - 7 x weekly - 1 sets - 20 reps - Gentle Horizontal Shoulder Pendulum  - 3-5 x daily - 7 x weekly - 1 sets - 20 reps - Gentle Vertical Shoulder Pendulum  - 3-5 x daily - 7 x weekly - 1 sets - 20 reps - Standing Scapular Retraction  - 5 x daily - 7 x weekly - 1 sets - 5 reps - 5 second hold - Supine Scapular Protraction in Flexion with Dumbbells  - 2-3 x daily - 7 x weekly - 1 sets - 10-20 reps - 3 seconds hold - Sidelying Shoulder  External Rotation Dumbbell  - 1-2 x daily - 7 x weekly - 2 sets - 10 reps - 1 hold  ASSESSMENT:  CLINICAL IMPRESSION:  Wanisha has very good ROM assessed supine (see objective).  Strength is still poor, although she is just 8 weeks post reverse TSA.  Strength will be the focus moving forward with scapular and deltoid strength of most importance.  Seraiah has been doing her exercises, although activities performed today were prioritized with her HEP and recommended to do 2X/Day.  With another 4 weeks of appropriate conservative strength progressions, Danielle should be ready for independent rehabilitation in 4 weeks.  OBJECTIVE IMPAIRMENTS: decreased ROM, decreased strength, hypomobility, increased edema, increased fascial restrictions, increased muscle spasms, impaired flexibility, impaired UE functional use, postural dysfunction, obesity, and pain.   ACTIVITY LIMITATIONS: carrying, lifting, bed mobility, bathing, toileting, dressing, reach over head, and caring for others  PARTICIPATION LIMITATIONS: cleaning, laundry, driving, shopping, community activity, and yard work  PERSONAL FACTORS: Age, Behavior pattern, Education, Fitness, Past/current experiences, Sex, Social background, and Time since onset of injury/illness/exacerbation are also affecting patient's functional outcome.   REHAB POTENTIAL: Good  CLINICAL DECISION MAKING: Stable/uncomplicated  EVALUATION COMPLEXITY: Low   GOALS: Goals reviewed with patient? Yes  SHORT TERM GOALS: Target date: 09/02/2022    Will be compliant with appropriate progressive HEP  Baseline: Goal status: Met 08/21/2022  2.  R shoulder flexion and scaption PROM to be at least 100 degrees, R shoulder ER PROM to be at least 20 degrees at 0* ABD  Baseline:  Goal status: Met 08/13/2022  3.  R shoulder flexion and scaption AAROM to be at least 90 degrees, R shoulder ER AAROM to reach neutral  Baseline:  Goal status: MET 08/25/2022   4.  Will demonstrate better functional posture/postural mechanics  Baseline:  Goal status: On Going 09/10/2022   LONG TERM GOALS: Target date: 10/09/2022    R shoulder flexion and scaption AROM to be at least 120 degrees  Baseline:  Goal status: On Going 09/10/2022  2.  R shoulder ER AROM to be at least 30 degrees at 45* ABD  Baseline:  Goal status: Met 09/10/2022  3.  Will be able to reach L5 level with active FIR no increased pain  Baseline:  Goal status: Ongoing 09/10/22  4.  Will be able to perform all dressing/bathing tasks on a Mod(I) basis no increase in pain  Baseline:  Goal status: Ongoing 09/10/22  5.  Will be able to reach overhead to put dishes in the cabinet without increase in pain  Baseline:  Goal status: Ongoing 09/10/22   PLAN:  PT FREQUENCY: 2 x per week  PT DURATION: 4 weeks  PLANNED INTERVENTIONS: Therapeutic exercises, Therapeutic activity, Neuromuscular re-education, Balance training, Gait training, Patient/Family education, Self Care, Joint mobilization, Aquatic Therapy, Dry Needling, Electrical stimulation, Cryotherapy, Moist heat, Taping, Ultrasound, Ionotophoresis 4mg /ml Dexamethasone, Manual therapy, and Re-evaluation  PLAN FOR NEXT SESSION:  Scapular strength/functional work (without external resistance).  Progress per Dr. Serena Croissant protocol.  NEXT MD VISIT: 09/11/22  Cherlyn Cushing PT, MPT

## 2022-09-11 ENCOUNTER — Encounter: Payer: Self-pay | Admitting: Physical Therapy

## 2022-09-11 ENCOUNTER — Ambulatory Visit (INDEPENDENT_AMBULATORY_CARE_PROVIDER_SITE_OTHER): Payer: Medicare HMO | Admitting: Orthopaedic Surgery

## 2022-09-11 ENCOUNTER — Ambulatory Visit (INDEPENDENT_AMBULATORY_CARE_PROVIDER_SITE_OTHER): Payer: Medicare HMO

## 2022-09-11 ENCOUNTER — Ambulatory Visit (INDEPENDENT_AMBULATORY_CARE_PROVIDER_SITE_OTHER): Payer: Medicare HMO | Admitting: Physical Therapy

## 2022-09-11 DIAGNOSIS — Z471 Aftercare following joint replacement surgery: Secondary | ICD-10-CM | POA: Diagnosis not present

## 2022-09-11 DIAGNOSIS — M6281 Muscle weakness (generalized): Secondary | ICD-10-CM

## 2022-09-11 DIAGNOSIS — Z96611 Presence of right artificial shoulder joint: Secondary | ICD-10-CM | POA: Diagnosis not present

## 2022-09-11 DIAGNOSIS — M25511 Pain in right shoulder: Secondary | ICD-10-CM

## 2022-09-11 DIAGNOSIS — M25611 Stiffness of right shoulder, not elsewhere classified: Secondary | ICD-10-CM

## 2022-09-11 DIAGNOSIS — R293 Abnormal posture: Secondary | ICD-10-CM | POA: Diagnosis not present

## 2022-09-11 NOTE — Progress Notes (Signed)
Post Operative Evaluation    Procedure/Date of Surgery: Right shoulder reverse shoulder arthroplasty July 12, 2022  Interval History:    Presents today for follow-up of her right shoulder.  Overall she is continuing to improve.  She has no pain at today's visit.  She is here today for discussion.  She has been working in physical therapy with strengthening and range of motion.  PMH/PSH/Family History/Social History/Meds/Allergies:    Past Medical History:  Diagnosis Date   Adenomatous colon polyp    hyperplastic   Anemia    Anginal pain (HCC)    not recent   Arthritis    NECK, KNEES, FINGERS, TOES   Atrial tachycardia CARDIOLOGIST - DR Graciela Husbands (LAST VISIT AUG 2012)   Echo 12/11: EF 55-60%, Mild LVH, grade 1 diast dysfxn;   holter 1/12: ATach   Atypical chest pain    a. 07/2004 Cath: Clean cors;  b. 04/2010 Myoview: EF 63%, no ischemia   Blood transfusion without reported diagnosis 1969   CHF (congestive heart failure) (HCC)    Chronic kidney disease    left kidney small    Coronary artery disease    Diabetes mellitus, type 2 (HCC)    ORAL AND INSULIN MEDS (LAST A1C  7.3)   Diverticulosis    Erythema CURRENT-- CLOSED ABD. WALL ABSCESS   PER PCP NOTE (03-31-11)- MRSA-- TAKES DOXYCYCLINE   GERD (gastroesophageal reflux disease)    CONTROLLED W/ PREVACID   History of kidney stones 2012   Hyperlipidemia    Hypertension    Insomnia    TAKES MEDS PRN   Neuropathy, peripheral    Obesity (BMI 30-39.9) 02/19/2015   Pneumonia    as child   Post op infection 11/07/2012   left bunionectomy   Restless leg syndrome    Sepsis, urinary HISTORY - 2004   Sleep apnea    no cpap    Thyroid disease    TIA (transient ischemic attack)    "mini strokes" 2023   Tremor    Vitamin D deficiency    Past Surgical History:  Procedure Laterality Date   ABDOMINAL HYSTERECTOMY  1995   ovaries remain   BUNIONECTOMY Left 10/24/2012   BUNIONECTOMY Right  05/17/2017   CARDIAC CATHETERIZATION  07/10/2004   CARPAL TUNNEL RELEASE  2000   RIGHT   CATARACT EXTRACTION, BILATERAL     CERVICAL FUSION  10/12/2007   C5 - 7   COLONOSCOPY     CYSTOSCOPY WITH RETROGRADE PYELOGRAM, URETEROSCOPY AND STENT PLACEMENT Left 11/28/2012   Procedure: LEFT RETROGRADE PYELOGRAM, LEFT URETEROSCOPY, ;  Surgeon: Garnett Farm, MD;  Location: [facility];  Service: Urology;  Laterality: Left;   CYSTOSCOPY/RETROGRADE/URETEROSCOPY/STONE EXTRACTION WITH BASKET  X2 2004 & 2009   LEFT    GASTRIC BYPASS  1981   KNEE ARTHROSCOPY  12/2010   LEFT   LAPAROSCOPIC CHOLECYSTECTOMY  2001   LEFT HEART CATH AND CORONARY ANGIOGRAPHY N/A 12/03/2021   Procedure: LEFT HEART CATH AND CORONARY ANGIOGRAPHY;  Surgeon: Corky Crafts, MD;  Location: [facility] INV[facility]IVE CV LAB;  Service: Cardiovascular;  Laterality: N/A;   left thumb joint surgery  2013   PERCUTANEOUS NEPHROSTOLITHOTOMY  02/27/2011   LEFT   POLYPECTOMY     REVERSE SHOULDER ARTHROPLASTY Right 07/12/2022   Procedure: REVERSE SHOULDER ARTHROPLASTY;  Surgeon: Huel Cote,  MD;  Location: [facility] OR;  Service: Orthopedics;  Laterality: Right;   RIGHT THUMB JOINT SURG.  09/2009   TRIGGER FINGER RELEASE  2010   RIGHT THUMB   UPPER GASTROINTESTINAL ENDOSCOPY     URETERAL STENT PLACEMENT  X2  2004  &  2009   LEFT   URETEROSCOPY  04/06/2011   Procedure: URETEROSCOPY;  Surgeon: Garnett Farm, MD;  Location: [facility];  Service: Urology;  Laterality: Left;  L Ureteroscopy Laser Litho & Stent    Social History   Socioeconomic History   Marital status: Married    Spouse name: Not on file   Number of children: 2   Years of education: 12   Highest education level: 12th grade  Occupational History   Occupation: Educational psychologist middle school    Employer: [employer]    Comment: in office   Occupation: Retired  Tobacco Use   Smoking status: Every Day    Packs/day: 2.00    Years: 43.00    Additional pack years: 0.00     Total pack years: 86.00    Types: Cigarettes   Smokeless tobacco: Never   Tobacco comments:    1/2 ppd 12/29/21  Vaping Use   Vaping Use: Never used  Substance and Sexual Activity   Alcohol use: No    Alcohol/week: 0.0 standard drinks of alcohol   Drug use: No   Sexual activity: Not on file  Other Topics Concern   Not on file  Social History Narrative   2 children, 2 stepchildren   Lives with husband.   Right-handed.   No daily caffeine.   Social Determinants of Health   Financial Resource Strain: Low Risk  (08/15/2022)   Overall Financial Resource Strain (CARDIA)    Difficulty of Paying Living Expenses: Not hard at all  Food Insecurity: No Food Insecurity (08/15/2022)   Hunger Vital Sign    Worried About Running Out of Food in the Last Year: Never true    Ran Out of Food in the Last Year: Never true  Transportation Needs: No Transportation Needs (08/15/2022)   PRAPARE - Administrator, Civil Service (Medical): No    Lack of Transportation (Non-Medical): No  Physical Activity: Insufficiently Active (08/15/2022)   Exercise Vital Sign    Days of Exercise per Week: 1 day    Minutes of Exercise per Session: 30 min  Stress: No Stress Concern Present (08/15/2022)   [employer] of Occupational Health - Occupational Stress Questionnaire    Feeling of Stress : Not at all  Social Connections: Socially Integrated (08/15/2022)   Social Connection and Isolation Panel [NHANES]    Frequency of Communication with Friends and Family: More than three times a week    Frequency of Social Gatherings with Friends and Family: Patient declined    Attends Religious Services: More than 4 times per year    Active Member of [employer] or Organizations: Yes    Attends Banker Meetings: 1 to 4 times per year    Marital Status: Married   Family History  Problem Relation Age of Onset   Hyperlipidemia Mother    Hypertension Mother    Heart attack Father    Lung disease  Father    Heart disease Sister    Hypertension Sister    Colon polyps Sister    Hypertension Sister    Colon polyps Sister    Hypertension Sister    Hypertension Sister    Lung  cancer Sister 59       stage 4    Diabetes Other    Breast cancer Other    Heart disease Other    Colon cancer Other 50       nephew   Esophageal cancer Neg Hx    Rectal cancer Neg Hx    Stomach cancer Neg Hx    Amblyopia Neg Hx    Blindness Neg Hx    Cataracts Neg Hx    Glaucoma Neg Hx    Retinal detachment Neg Hx    Strabismus Neg Hx    Retinitis pigmentosa Neg Hx    No Known Allergies Current Outpatient Medications  Medication Sig Dispense Refill   acetaminophen (TYLENOL) 500 MG tablet Take 2 tablets (1,000 mg total) by mouth 3 (three) times daily. 30 tablet 0   albuterol (VENTOLIN HFA) 108 (90 Base) MCG/ACT inhaler TAKE 2 PUFFS BY MOUTH EVERY 6 HOURS AS NEEDED FOR WHEEZE OR SHORTNESS OF BREATH (Patient taking differently: Inhale 2 puffs into the lungs every 6 (six) hours as needed for wheezing or shortness of breath.) 18 each 6   Alcohol Swabs (DROPSAFE ALCOHOL PREP) 70 % PADS USE AS DIRECTED  AS NEEDED. 200 each 3   aspirin EC 81 MG tablet Take 1 tablet (81 mg total) by mouth daily. 30 tablet 11   atorvastatin (LIPITOR) 20 MG tablet TAKE 1 TABLET BY MOUTH EVERY DAY (Patient taking differently: Take 20 mg by mouth daily.) 90 tablet 1   Blood Glucose Monitoring Suppl (TRUE METRIX METER) w/Device KIT USE AS DIRECTED 1 kit 1   calcium-vitamin D (OSCAL WITH D) 500-5 MG-MCG tablet Take 1 tablet by mouth.     cephALEXin (KEFLEX) 500 MG capsule Take 500 mg by mouth daily at 12 noon.     cholecalciferol (VITAMIN D) 25 MCG (1000 UNIT) tablet Take 1,000 Units by mouth daily.     Cholecalciferol (VITAMIN D-3) 25 MCG (1000 UT) CAPS Take 1,000 Units by mouth daily.     clonazePAM (KLONOPIN) 1 MG tablet TAKE 1 TABLET AT BEDTIME FOR RESTLESS LEGS SYNDROME (Patient taking differently: Take 0.5-1 mg by mouth at  bedtime as needed (for restless legs).) 30 tablet 3   Continuous Blood Gluc Receiver (DEXCOM G6 RECEIVER) DEVI Use as directed w/ G6 sensor 1 each 0   Continuous Blood Gluc Sensor (DEXCOM G6 SENSOR) MISC Apply sensor every 10 days for continuous blood sugar readings 3 each 3   Continuous Blood Gluc Transmit (DEXCOM G6 TRANSMITTER) MISC Use as directed w/ G6 sensor 1 each 0   diclofenac Sodium (VOLTAREN) 1 % GEL APPLY 2 GRAMS TO AFFECTED AREA 4 TIMES A DAY (Patient taking differently: Apply 2 g topically 4 (four) times daily as needed (for pain- hands and knees).) 300 g 1   fenofibrate 160 MG tablet Take 1 tablet (160 mg total) by mouth daily. 90 tablet 10   furosemide (LASIX) 20 MG tablet TAKE 1 TABLET EVERY DAY 90 tablet 1   gabapentin (NEURONTIN) 300 MG capsule Take 3 capsules (900 mg total) by mouth 3 (three) times daily. 270 capsule 11   glucose blood (TRUE METRIX BLOOD GLUCOSE TEST) test strip Use as instructed to test blood sugar 4 times daily. Dx. E11.9 400 each 3   insulin aspart (NOVOLOG FLEXPEN) 100 UNIT/ML FlexPen INJECT 25 UNITS SUBCUTANEOUSLY THREE TIMES DAILY WITH MEALS (Patient taking differently: Inject 25 Units into the skin 3 (three) times daily with meals.) 75 mL 0   insulin glargine (  LANTUS SOLOSTAR) 100 UNIT/ML Solostar Pen INJECT 60 UNITS UNDER THE SKIN AT BEDTIME (Patient taking differently: Inject 60 Units into the skin at bedtime.) 60 mL 10   Insulin Pen Needle (DROPLET PEN NEEDLES) 31G X 8 MM MISC USE 4 TIMES DAILY 400 each 6   Lancets Super Thin 28G MISC Please use as directed to test sugars 4 times daily. Dx. E11.9 420 each 3   lansoprazole (PREVACID) 15 MG capsule Take 15 mg by mouth daily before breakfast.     levothyroxine (SYNTHROID) 50 MCG tablet TAKE 1 TABLET BY MOUTH EVERY DAY BEFORE BREAKFAST (Patient taking differently: Take 50 mcg by mouth daily before breakfast.) 90 tablet 1   loperamide (IMODIUM A-D) 2 MG tablet Take 2 mg by mouth 4 (four) times daily as needed  for diarrhea or loose stools.     Oxycodone HCl 10 MG TABS Take 1 tablet (10 mg total) by mouth every 6 (six) hours as needed. 28 tablet 0   primidone (MYSOLINE) 50 MG tablet TAKE  2   TABLETS IN THE AM  AND TAKE 1 IN THE PM  BY MOUTH TWICE A DAY (Patient taking differently: Take 50-100 mg by mouth See admin instructions. Take 100 mg by mouth in the morning and 50 mg in the evening) 270 tablet 0   sertraline (ZOLOFT) 50 MG tablet Take 1 tablet (50 mg total) by mouth daily. 90 tablet 3   traZODone (DESYREL) 100 MG tablet Take 1 tablet (100 mg total) by mouth at bedtime. 90 tablet 3   valsartan (DIOVAN) 320 MG tablet Take 1 tablet (320 mg total) by mouth daily. 90 tablet 2   verapamil (CALAN-SR) 120 MG CR tablet TAKE 1 TABLET BY MOUTH EVERYDAY AT BEDTIME (Patient taking differently: Take 120 mg by mouth at bedtime.) 90 tablet 1   Vibegron (GEMTESA) 75 MG TABS Take 1 tablet by mouth daily.     No current facility-administered medications for this visit.   No results found.  Review of Systems:   A ROS was performed including pertinent positives and negatives as documented in the HPI.   Musculoskeletal Exam:     Right shoulder incision is healed.  Stable to flex and extend at the right elbow.  Passive motion is to 70 degrees in the supine position she fires all 3 heads of the deltoid.  Sensation is intact in axillary distribution.  Fires EPL as well as wrist extensors.  2+ radial pulse  Imaging:    AP right shoulder: Status post reverse shoulder arthroplasty without evidence of complication  I personally reviewed and interpreted the radiographs.   Assessment:   72 year old female is status post right shoulder reverse shoulder arthroplasty for fracture.  Overall she is improving somewhat slowly.  She does have significant tuberosity healing and at this point I do believe she may be active range of motion as tolerated.  She will continue to work on strengthening at this time.  I will plan to  see her back in 6 weeks for reassessment Plan :    -Return to clinic 6 weeks for reassessment      I personally saw and evaluated the patient, and participated in the management and treatment plan.  Huel Cote, MD Attending Physician, Orthopedic Surgery  This document was dictated using Dragon voice recognition software. A reasonable attempt at proof reading has been made to minimize errors.

## 2022-09-11 NOTE — Therapy (Signed)
OUTPATIENT PHYSICAL THERAPY SHOULDER TREATMENT  Patient Name: Lacey [name] MRN: [id number] DOB:[birth year], 72 y.o., female Today's Date: 09/11/2022  END OF SESSION:  PT End of Session - 09/11/22 1009     Visit Number 18    Number of Visits 25    Date for PT Re-Evaluation 10/09/22    Authorization Type [employer] MCR    Authorization Time Period --    Progress Note Due on Visit 27    PT Start Time 1008    PT Stop Time 1048    PT Time Calculation (min) 40 min    Activity Tolerance Patient tolerated treatment well;No increased pain;Patient limited by fatigue    Behavior During Therapy [facility] for tasks assessed/performed             .    Past Medical History:  Diagnosis Date   Adenomatous colon polyp    hyperplastic   Anemia    Anginal pain (HCC)    not recent   Arthritis    NECK, KNEES, FINGERS, TOES   Atrial tachycardia CARDIOLOGIST - DR Graciela Husbands (LAST VISIT AUG 2012)   Echo 12/11: EF 55-60%, Mild LVH, grade 1 diast dysfxn;   holter 1/12: ATach   Atypical chest pain    a. 07/2004 Cath: Clean cors;  b. 04/2010 Myoview: EF 63%, no ischemia   Blood transfusion without reported diagnosis 1969   CHF (congestive heart failure) (HCC)    Chronic kidney disease    left kidney small    Coronary artery disease    Diabetes mellitus, type 2 (HCC)    ORAL AND INSULIN MEDS (LAST A1C  7.3)   Diverticulosis    Erythema CURRENT-- CLOSED ABD. WALL ABSCESS   PER PCP NOTE (03-31-11)- MRSA-- TAKES DOXYCYCLINE   GERD (gastroesophageal reflux disease)    CONTROLLED W/ PREVACID   History of kidney stones 2012   Hyperlipidemia    Hypertension    Insomnia    TAKES MEDS PRN   Neuropathy, peripheral    Obesity (BMI 30-39.9) 02/19/2015   Pneumonia    as child   Post op infection 11/07/2012   left bunionectomy   Restless leg syndrome    Sepsis, urinary HISTORY - 2004   Sleep apnea    no cpap    Thyroid disease    TIA (transient ischemic attack)    "mini strokes" 2023   Tremor    Vitamin D  deficiency    Past Surgical History:  Procedure Laterality Date   ABDOMINAL HYSTERECTOMY  1995   ovaries remain   BUNIONECTOMY Left 10/24/2012   BUNIONECTOMY Right 05/17/2017   CARDIAC CATHETERIZATION  07/10/2004   CARPAL TUNNEL RELEASE  2000   RIGHT   CATARACT EXTRACTION, BILATERAL     CERVICAL FUSION  10/12/2007   C5 - 7   COLONOSCOPY     CYSTOSCOPY WITH RETROGRADE PYELOGRAM, URETEROSCOPY AND STENT PLACEMENT Left 11/28/2012   Procedure: LEFT RETROGRADE PYELOGRAM, LEFT URETEROSCOPY, ;  Surgeon: Garnett Farm, MD;  Location: [facility];  Service: Urology;  Laterality: Left;   CYSTOSCOPY/RETROGRADE/URETEROSCOPY/STONE EXTRACTION WITH BASKET  X2 2004 & 2009   LEFT    GASTRIC BYPASS  1981   KNEE ARTHROSCOPY  12/2010   LEFT   LAPAROSCOPIC CHOLECYSTECTOMY  2001   LEFT HEART CATH AND CORONARY ANGIOGRAPHY N/A 12/03/2021   Procedure: LEFT HEART CATH AND CORONARY ANGIOGRAPHY;  Surgeon: Corky Crafts, MD;  Location: [facility] INVASIVE CV LAB;  Service: Cardiovascular;  Laterality: N/A;   left thumb joint  surgery  2013   PERCUTANEOUS NEPHROSTOLITHOTOMY  02/27/2011   LEFT   POLYPECTOMY     REVERSE SHOULDER ARTHROPLASTY Right 07/12/2022   Procedure: REVERSE SHOULDER ARTHROPLASTY;  Surgeon: Huel Cote, MD;  Location: [facility];  Service: Orthopedics;  Laterality: Right;   RIGHT THUMB JOINT SURG.  09/2009   TRIGGER FINGER RELEASE  2010   RIGHT THUMB   UPPER GASTROINTESTINAL ENDOSCOPY     URETERAL STENT PLACEMENT  X2  2004  &  2009   LEFT   URETEROSCOPY  04/06/2011   Procedure: URETEROSCOPY;  Surgeon: Garnett Farm, MD;  Location: [facility];  Service: Urology;  Laterality: Left;  L Ureteroscopy Laser Litho & Stent    Patient Active Problem List   Diagnosis Date Noted   Anxiety 08/19/2022   Closed 3-part fracture of proximal end of right humerus 07/11/2022   Shoulder dislocation 07/11/2022   Abnormal stress test    Atypical chest pain 08/05/2021   Bradycardia 06/20/2021    Abnormal gait 03/17/2021   Acquired deformity of lower leg 03/17/2021   Urinary urgency 11/14/2020   Paresthesia 08/15/2020   Right lumbar pain 08/15/2020   Tremor 08/15/2020   Hypoglycemia associated with diabetes (HCC) 02/29/2020   Essential (primary) hypertension 08/10/2019   Chronic neck pain 06/23/2016   Radicular low back pain 07/12/2015   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 without macular edema (HCC) 04/24/2015   Severe obesity (BMI >= 40) (HCC) 02/19/2015   OSA (obstructive sleep apnea) 10/23/2014   Excessive daytime sleepiness 10/23/2014   Hypothyroidism 09/07/2014   Tobacco abuse 01/15/2014   Recurrent UTI 09/06/2013   Orthostatic hypotension 10/05/2012   General medical examination 06/22/2011   Atrial tachycardia 11/25/2010   Pre-operative cardiovascular examination 11/25/2010   Chronic diarrhea 08/25/2010   KNEE PAIN 07/14/2010   Knee pain 07/14/2010   DIASTOLIC HEART FAILURE, CHRONIC 06/09/2010   PERSONAL HISTORY OF COLONIC POLYPS 05/01/2010   Vitamin D deficiency 01/20/2010   RESTLESS LEG SYNDROME 01/20/2010   Insomnia 01/20/2010   Restless leg syndrome 01/20/2010   Hyperlipidemia associated with type 2 diabetes mellitus (HCC) 01/08/2009   Anemia 01/08/2009   GERD 01/08/2009   Benign hypertensive heart disease with heart failure (HCC) 01/08/2009    PCP: Neena Rhymes MD   REFERRING PROVIDER: Huel Cote, MD  REFERRING DIAG: [phone] (ICD-10-CM) - Status post reverse arthroplasty of right shoulder  THERAPY DIAG:  Acute pain of right shoulder  Muscle weakness (generalized)  Stiffness of right shoulder, not elsewhere classified  Abnormal posture  Rationale for Evaluation and Treatment: Rehabilitation  ONSET DATE: Date of Surgery: 07/12/22  SUBJECTIVE:  SUBJECTIVE STATEMENT: Sees MD today; a little sore today.   Hand dominance: Right  PERTINENT HISTORY: 72 year old female is 2 weeks status post right shoulder reverse shoulder arthroplasty overall doing very well. Because this is a reverse for fracture I will asked that she remain in the sling for an additional 2 weeks. She may come out of this for passive range of motion at this time 1 month postop. That time she will begin following the reverse for fracture protocol. Will plan on outpatient physical therapy.  PAIN:  Are you having pain? Yes: NPRS scale: denies pain; just soreness Pain location: R shoulder and around R collar bone    Pain description:  unclear  Aggravating factors: unclear   Relieving factors: unclear     PRECAUTIONS: Other: reverse total shoulder for fracture protocol   WEIGHT BEARING RESTRICTIONS: Yes NWB surgical LE   FALLS:  Has patient fallen in last 6 months? Yes. Number of falls 2- one fall on ramp in the rain that led to fracture/surgery, other fall was the night she came home from the hospital and just passed out; FOF (+)  LIVING ENVIRONMENT: Lives with: lives with their spouse Lives in: House/apartment Stairs: No Has following equipment at home: Single point cane, Environmental consultant - 2 wheeled, Crutches, Wheelchair (manual), bed side commode, and Ramped entry + walk-in shower, shower bench   OCCUPATION: Retired   PLOF: Independent, Independent with basic ADLs, Independent with gait, and Independent with transfers  PATIENT GOALS: move my arm, be able to wash dishes, brush teeth R handed- generally improve level of function    OBJECTIVE:   PATIENT SURVEYS:  09/10/2022 FOTO 57 (Goal met)  08/25/22 FOTO 49  EVAL: FOTO 32, predicted 57  COGNITION: Overall cognitive status: Within functional limits for tasks assessed and very anxious about therapy      SENSATION: Reports numbness and burning in upper arm since surgery    POSTURE: Rounded shoulders, forward head, increased thoracic kyphosis   UPPER EXTREMITY ROM:   Passive ROM Right eval Right 08/11/22 Right 08/13/2022 Right 08/17/22 Right 08/21/2022 Right 08/25/22 Right 08/26/2022 Right 09/02/22 Left/Right 09/10/2022 assessed supine   Shoulder flexion 45-50* P: 87 100 P: 110 Passive 110 AA: 136 110 PROM 120-130* supine, AAROM 120* supine   150/130   Shoulder scaption <20*       PROM 130-140*, 110* AAROM    Shoulder abduction  P: 78  P: 110  AA: 143 (Scaption)     Shoulder internal rotation   60  65  65  95/65  Shoulder external rotation -10* to -15* (edited 08/06/22, noted that I put ER value in IR column KEU) P: 20 30  35 AA: 35 40 PROM with arm at side 40-45*; AAROM 30-40*  60/50  Elbow flexion About 75% limited PROM, very guarded           Elbow extension WNL PROM but very guarded           (Blank rows = not tested)  UPPER EXTREMITY STRENGTH:  In pounds with hand-held dynamometer Right 09/10/2022 Left 09/10/2022  Shoulder flexion    Shoulder extension    Shoulder abduction    Shoulder adduction    Shoulder internal rotation 8.5 23.8  Shoulder external rotation < 3 pounds 18.5  (Blank rows = not tested)- DNT at eval due to limitations in protocol    TODAY'S TREATMENT:  DATE:  09/11/22 TherEx Seated scapular retraction 2x10, 5 sec hold Seated Rt bicep curl 2x10 Supine AA shoulder flexion with elbow flexion 2x10 Supine shoulder isometrics  (extension, flexion, abdct, ER/IR) 10 x 5 sec hold each AA supine shoulder protraction 2x10 AA shoulder abdct to 90 in sidelying x 10 AA shoulder abdct with elbow flexion in sidelying to 90 x 10 Active sidelying ER (limited range) 2x10 Seated abduction with elbow flexion (limited range) 2x10  09/10/2022 Active assisted supine arm raises (scapular protraction with palm facing in and  left side assisting) 3 sets of 20 for 3 seconds  Side lie ER (lie left, keep Rt elbow bent 90 degrees and rotate up as far as possible) 2 sets of 10 with PT assist  Shoulder blade pinches 10 for 5 seconds  Supine active assisted shoulder flexion (left side helps) 10X 10 seconds  Supine active shoulder ER 10X 10 seconds   09/07/22 TherEx  PROM/stretching for shoulder mobility/function - flexion, scaption, ER PROM within limits of protocol   AAROM for shoulder mobility and strength - flexion with PT assisting about 40% x10  - scaption with PT assisting about 40% x10 - ER with PT assisting about 50% x10 (sidelying with elbow tucked to side) - sidelying ABD with PT assist 60% x10  Thoracic lateral flexion and rotations x10 B   09/04/22  TherEx  PROM/stretching for shoulder mobility/function - flexion, scaption, ER PROM within limits of protocol   AAROM for shoulder mobility and strength - flexion with PT assisting about 40% x10  - scaption with PT assisting about 40% x10 - ER with PT assisting about 50% x10   Serratus punches 0# 2x10 AAROM 50% by PT  Thoracic 3D excursions x10 rotation and lateral flexion    PATIENT EDUCATION: Education details: exam findings, POC, HEP; extensive education on typical recovery after shoulder surgery, also benefit of taking pain medicine prior to PT to make skilled interventions more tolerable  Person educated: Patient Education method: Explanation, Demonstration, and Handouts Education comprehension: verbalized understanding, returned demonstration, and needs further education  HOME EXERCISE PROGRAM: Access Code: [email or web address] URL: [email or web address] Date: 08/26/2022 Prepared by: Pauletta Browns  Exercises - Seated Shoulder Pendulum Exercise  - 3-5 x daily - 7 x weekly - 1 sets - 20 reps - Gentle Horizontal Shoulder Pendulum  - 3-5 x daily - 7 x weekly - 1 sets - 20 reps - Gentle Vertical Shoulder Pendulum  - 3-5 x  daily - 7 x weekly - 1 sets - 20 reps - Standing Scapular Retraction  - 5 x daily - 7 x weekly - 1 sets - 5 reps - 5 second hold - Supine Scapular Protraction in Flexion with Dumbbells  - 2-3 x daily - 7 x weekly - 1 sets - 10-20 reps - 3 seconds hold - Sidelying Shoulder External Rotation Dumbbell  - 1-2 x daily - 7 x weekly - 2 sets - 10 reps - 1 hold  ASSESSMENT:  CLINICAL IMPRESSION: Pt tolerated session well today.  Continues to have difficulty initiating AROM but able to progress with cues.  Will continue to benefit from PT to maximize function.  OBJECTIVE IMPAIRMENTS: decreased ROM, decreased strength, hypomobility, increased edema, increased fascial restrictions, increased muscle spasms, impaired flexibility, impaired UE functional use, postural dysfunction, obesity, and pain.   ACTIVITY LIMITATIONS: carrying, lifting, bed mobility, bathing, toileting, dressing, reach over head, and caring for others  PARTICIPATION LIMITATIONS: cleaning, laundry, driving, shopping, community activity, and yard work  PERSONAL FACTORS:  Age, Behavior pattern, Education, Fitness, Past/current experiences, Sex, Social background, and Time since onset of injury/illness/exacerbation are also affecting patient's functional outcome.   REHAB POTENTIAL: Good  CLINICAL DECISION MAKING: Stable/uncomplicated  EVALUATION COMPLEXITY: Low   GOALS: Goals reviewed with patient? Yes  SHORT TERM GOALS: Target date: 09/02/2022   Will be compliant with appropriate progressive HEP  Baseline: Goal status: Met 08/21/2022  2.  R shoulder flexion and scaption PROM to be at least 100 degrees, R shoulder ER PROM to be at least 20 degrees at 0* ABD  Baseline:  Goal status: Met 08/13/2022  3.  R shoulder flexion and scaption AAROM to be at least 90 degrees, R shoulder ER AAROM to reach neutral  Baseline:  Goal status: MET 08/25/2022   4.  Will demonstrate better functional posture/postural mechanics  Baseline:  Goal  status: On Going 09/10/2022   LONG TERM GOALS: Target date: 10/09/2022    R shoulder flexion and scaption AROM to be at least 120 degrees  Baseline:  Goal status: On Going 09/10/2022  2.  R shoulder ER AROM to be at least 30 degrees at 45* ABD  Baseline:  Goal status: Met 09/10/2022  3.  Will be able to reach L5 level with active FIR no increased pain  Baseline:  Goal status: Ongoing 09/10/22  4.  Will be able to perform all dressing/bathing tasks on a Mod(I) basis no increase in pain  Baseline:  Goal status: Ongoing 09/10/22  5.  Will be able to reach overhead to put dishes in the cabinet without increase in pain  Baseline:  Goal status: Ongoing 09/10/22   PLAN:  PT FREQUENCY: 2 x per week  PT DURATION: 4 weeks  PLANNED INTERVENTIONS: Therapeutic exercises, Therapeutic activity, Neuromuscular re-education, Balance training, Gait training, Patient/Family education, Self Care, Joint mobilization, Aquatic Therapy, Dry Needling, Electrical stimulation, Cryotherapy, Moist heat, Taping, Ultrasound, Ionotophoresis 4mg /ml Dexamethasone, Manual therapy, and Re-evaluation  PLAN FOR NEXT SESSION:   Scapular strength/functional work (without external resistance).  Progress per Dr. Serena Croissant protocol (check MD notes to see if able to move into next phase)  NEXT MD VISIT: 09/11/22  Clarita Crane, PT, DPT 09/11/22 10:49 AM

## 2022-09-14 ENCOUNTER — Encounter: Payer: Self-pay | Admitting: Physical Therapy

## 2022-09-14 ENCOUNTER — Ambulatory Visit: Payer: Medicare HMO | Admitting: Physical Therapy

## 2022-09-14 DIAGNOSIS — M25511 Pain in right shoulder: Secondary | ICD-10-CM | POA: Diagnosis not present

## 2022-09-14 DIAGNOSIS — R293 Abnormal posture: Secondary | ICD-10-CM | POA: Diagnosis not present

## 2022-09-14 DIAGNOSIS — M6281 Muscle weakness (generalized): Secondary | ICD-10-CM

## 2022-09-14 DIAGNOSIS — M25611 Stiffness of right shoulder, not elsewhere classified: Secondary | ICD-10-CM

## 2022-09-14 MED ORDER — TRUE METRIX BLOOD GLUCOSE TEST VI STRP: ORAL_STRIP | 3 refills | Status: DC

## 2022-09-14 MED ORDER — TRUE METRIX METER W/DEVICE KIT: PACK | 1 refills | Status: AC

## 2022-09-14 NOTE — Therapy (Signed)
OUTPATIENT PHYSICAL THERAPY SHOULDER TREATMENT  Patient Name: Lacey [name] MRN: [id number] DOB:1951/01/25, 72 y.o., female Today's Date: 09/14/2022  END OF SESSION:  PT End of Session - 09/14/22 1140     Visit Number 19    Number of Visits 25    Date for PT Re-Evaluation 10/09/22    Authorization Type [employer] MCR    Progress Note Due on Visit 27    PT Start Time 1135    PT Stop Time 1215    PT Time Calculation (min) 40 min    Activity Tolerance Patient tolerated treatment well;No increased pain;Patient limited by fatigue    Behavior During Therapy [facility] for tasks assessed/performed              .    Past Medical History:  Diagnosis Date   Adenomatous colon polyp    hyperplastic   Anemia    Anginal pain (HCC)    not recent   Arthritis    NECK, KNEES, FINGERS, TOES   Atrial tachycardia CARDIOLOGIST - DR Graciela Husbands (LAST VISIT AUG 2012)   Echo 12/11: EF 55-60%, Mild LVH, grade 1 diast dysfxn;   holter 1/12: ATach   Atypical chest pain    a. 07/2004 Cath: Clean cors;  b. 04/2010 Myoview: EF 63%, no ischemia   Blood transfusion without reported diagnosis 1969   CHF (congestive heart failure) (HCC)    Chronic kidney disease    left kidney small    Coronary artery disease    Diabetes mellitus, type 2 (HCC)    ORAL AND INSULIN MEDS (LAST A1C  7.3)   Diverticulosis    Erythema CURRENT-- CLOSED ABD. WALL ABSCESS   PER PCP NOTE (03-31-11)- MRSA-- TAKES DOXYCYCLINE   GERD (gastroesophageal reflux disease)    CONTROLLED W/ PREVACID   History of kidney stones 2012   Hyperlipidemia    Hypertension    Insomnia    TAKES MEDS PRN   Neuropathy, peripheral    Obesity (BMI 30-39.9) 02/19/2015   Pneumonia    as child   Post op infection 11/07/2012   left bunionectomy   Restless leg syndrome    Sepsis, urinary HISTORY - 2004   Sleep apnea    no cpap    Thyroid disease    TIA (transient ischemic attack)    "mini strokes" 2023   Tremor    Vitamin D deficiency    Past Surgical  History:  Procedure Laterality Date   ABDOMINAL HYSTERECTOMY  1995   ovaries remain   BUNIONECTOMY Left 10/24/2012   BUNIONECTOMY Right 05/17/2017   CARDIAC CATHETERIZATION  07/10/2004   CARPAL TUNNEL RELEASE  2000   RIGHT   CATARACT EXTRACTION, BILATERAL     CERVICAL FUSION  10/12/2007   C5 - 7   COLONOSCOPY     CYSTOSCOPY WITH RETROGRADE PYELOGRAM, URETEROSCOPY AND STENT PLACEMENT Left 11/28/2012   Procedure: LEFT RETROGRADE PYELOGRAM, LEFT URETEROSCOPY, ;  Surgeon: Garnett Farm, MD;  Location: [facility];  Service: Urology;  Laterality: Left;   CYSTOSCOPY/RETROGRADE/URETEROSCOPY/STONE EXTRACTION WITH BASKET  X2 2004 & 2009   LEFT    GASTRIC BYPASS  1981   KNEE ARTHROSCOPY  12/2010   LEFT   LAPAROSCOPIC CHOLECYSTECTOMY  2001   LEFT HEART CATH AND CORONARY ANGIOGRAPHY N/A 12/03/2021   Procedure: LEFT HEART CATH AND CORONARY ANGIOGRAPHY;  Surgeon: Corky Crafts, MD;  Location: [facility] INVASIVE CV LAB;  Service: Cardiovascular;  Laterality: N/A;   left thumb joint surgery  2013   PERCUTANEOUS  NEPHROSTOLITHOTOMY  02/27/2011   LEFT   POLYPECTOMY     REVERSE SHOULDER ARTHROPLASTY Right 07/12/2022   Procedure: REVERSE SHOULDER ARTHROPLASTY;  Surgeon: Huel Cote, MD;  Location: [facility];  Service: Orthopedics;  Laterality: Right;   RIGHT THUMB JOINT SURG.  09/2009   TRIGGER FINGER RELEASE  2010   RIGHT THUMB   UPPER GASTROINTESTINAL ENDOSCOPY     URETERAL STENT PLACEMENT  X2  2004  &  2009   LEFT   URETEROSCOPY  04/06/2011   Procedure: URETEROSCOPY;  Surgeon: Garnett Farm, MD;  Location: [facility];  Service: Urology;  Laterality: Left;  L Ureteroscopy Laser Litho & Stent    Patient Active Problem List   Diagnosis Date Noted   Anxiety 08/19/2022   Closed 3-part fracture of proximal end of right humerus 07/11/2022   Shoulder dislocation 07/11/2022   Abnormal stress test    Atypical chest pain 08/05/2021   Bradycardia 06/20/2021   Abnormal gait 03/17/2021    Acquired deformity of lower leg 03/17/2021   Urinary urgency 11/14/2020   Paresthesia 08/15/2020   Right lumbar pain 08/15/2020   Tremor 08/15/2020   Hypoglycemia associated with diabetes (HCC) 02/29/2020   Essential (primary) hypertension 08/10/2019   Chronic neck pain 06/23/2016   Radicular low back pain 07/12/2015   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 without macular edema (HCC) 04/24/2015   Severe obesity (BMI >= 40) (HCC) 02/19/2015   OSA (obstructive sleep apnea) 10/23/2014   Excessive daytime sleepiness 10/23/2014   Hypothyroidism 09/07/2014   Tobacco abuse 01/15/2014   Recurrent UTI 09/06/2013   Orthostatic hypotension 10/05/2012   General medical examination 06/22/2011   Atrial tachycardia 11/25/2010   Pre-operative cardiovascular examination 11/25/2010   Chronic diarrhea 08/25/2010   KNEE PAIN 07/14/2010   Knee pain 07/14/2010   DIASTOLIC HEART FAILURE, CHRONIC 06/09/2010   PERSONAL HISTORY OF COLONIC POLYPS 05/01/2010   Vitamin D deficiency 01/20/2010   RESTLESS LEG SYNDROME 01/20/2010   Insomnia 01/20/2010   Restless leg syndrome 01/20/2010   Hyperlipidemia associated with type 2 diabetes mellitus (HCC) 01/08/2009   Anemia 01/08/2009   GERD 01/08/2009   Benign hypertensive heart disease with heart failure (HCC) 01/08/2009    PCP: Neena Rhymes MD   REFERRING PROVIDER: Huel Cote, MD  REFERRING DIAG: [phone] (ICD-10-CM) - Status post reverse arthroplasty of right shoulder  THERAPY DIAG:  Acute pain of right shoulder  Muscle weakness (generalized)  Abnormal posture  Stiffness of right shoulder, not elsewhere classified  Rationale for Evaluation and Treatment: Rehabilitation  ONSET DATE: Date of Surgery: 07/12/22  SUBJECTIVE:  SUBJECTIVE STATEMENT: Sore today, slept in her own bed and drove yesterday.    Hand dominance: Right  PERTINENT HISTORY: 72 year old female is 2 weeks status post right shoulder reverse shoulder arthroplasty overall doing very well. Because this is a reverse for fracture I will asked that she remain in the sling for an additional 2 weeks. She may come out of this for passive range of motion at this time 1 month postop. That time she will begin following the reverse for fracture protocol. Will plan on outpatient physical therapy.  PAIN:  Are you having pain? Yes: NPRS scale: denies pain; just soreness Pain location: R shoulder and around R collar bone    Pain description:  unclear  Aggravating factors: unclear   Relieving factors: unclear     PRECAUTIONS: Other: reverse total shoulder for fracture protocol   WEIGHT BEARING RESTRICTIONS: Yes NWB surgical LE   FALLS:  Has patient fallen in last 6 months? Yes. Number of falls 2- one fall on ramp in the rain that led to fracture/surgery, other fall was the night she came home from the hospital and just passed out; FOF (+)  LIVING ENVIRONMENT: Lives with: lives with their spouse Lives in: House/apartment Stairs: No Has following equipment at home: Single point cane, Environmental consultant - 2 wheeled, Crutches, Wheelchair (manual), bed side commode, and Ramped entry + walk-in shower, shower bench   OCCUPATION: Retired   PLOF: Independent, Independent with basic ADLs, Independent with gait, and Independent with transfers  PATIENT GOALS: move my arm, be able to wash dishes, brush teeth R handed- generally improve level of function    OBJECTIVE:   PATIENT SURVEYS:  09/10/2022 FOTO 57 (Goal met)  08/25/22 FOTO 49  EVAL: FOTO 32, predicted 57  COGNITION: Overall cognitive status: Within functional limits for tasks assessed and very anxious about therapy      SENSATION: Reports numbness and burning in upper arm since surgery    POSTURE: Rounded shoulders, forward head, increased thoracic kyphosis   UPPER EXTREMITY ROM:   Passive ROM Right eval Right 08/11/22 Right 08/13/2022 Right 08/17/22 Right 08/21/2022 Right 08/25/22 Right 08/26/2022 Right 09/02/22 Left/Right 09/10/2022 assessed supine   Shoulder flexion 45-50* P: 87 100 P: 110 Passive 110 AA: 136 110 PROM 120-130* supine, AAROM 120* supine   150/130   Shoulder scaption <20*       PROM 130-140*, 110* AAROM    Shoulder abduction  P: 78  P: 110  AA: 143 (Scaption)     Shoulder internal rotation   60  65  65  95/65  Shoulder external rotation -10* to -15* (edited 08/06/22, noted that I put ER value in IR column KEU) P: 20 30  35 AA: 35 40 PROM with arm at side 40-45*; AAROM 30-40*  60/50  Elbow flexion About 75% limited PROM, very guarded           Elbow extension WNL PROM but very guarded           (Blank rows = not tested)  UPPER EXTREMITY STRENGTH:  In pounds with hand-held dynamometer Right 09/10/2022 Left 09/10/2022  Shoulder flexion    Shoulder extension    Shoulder abduction    Shoulder adduction    Shoulder internal rotation 8.5 23.8  Shoulder external rotation < 3 pounds 18.5  (Blank rows = not tested)- DNT at eval due to limitations in protocol    TODAY'S TREATMENT:  DATE:  09/14/22 TherEx UBE L1 x 8 min; forward only trying to use LLE mainly for RUE motion Seated scapular retraction 2x10; 5 sec hold Seated AA abduction with PT providing concentric assist x 10 reps; in elbow flexion Seated AA abduction with elbow flexion and 1# bar x 10 reps Standing wall ladder 12-16 steps x 10 reps; flexion only; assist at Rt elbow to decrease arm dropping Supine chest press 1# bar 2x10 Supine shoulder isometrics  (extension, flexion, abdct, ER/IR) 10 x 5 sec hold each Supine active flexion with elbow flexed; limited range but  improved each rep   09/11/22 TherEx Seated scapular retraction 2x10, 5 sec hold Seated Rt bicep curl 2x10 Supine AA shoulder flexion with elbow flexion 2x10 Supine shoulder isometrics  (extension, flexion, abdct, ER/IR) 10 x 5 sec hold each AA supine shoulder protraction 2x10 AA shoulder abdct to 90 in sidelying x 10 AA shoulder abdct with elbow flexion in sidelying to 90 x 10 Active sidelying ER (limited range) 2x10 Seated abduction with elbow flexion (limited range) 2x10  09/10/2022 Active assisted supine arm raises (scapular protraction with palm facing in and left side assisting) 3 sets of 20 for 3 seconds  Side lie ER (lie left, keep Rt elbow bent 90 degrees and rotate up as far as possible) 2 sets of 10 with PT assist  Shoulder blade pinches 10 for 5 seconds  Supine active assisted shoulder flexion (left side helps) 10X 10 seconds  Supine active shoulder ER 10X 10 seconds  PATIENT EDUCATION: Education details: exam findings, POC, HEP; extensive education on typical recovery after shoulder surgery, also benefit of taking pain medicine prior to PT to make skilled interventions more tolerable  Person educated: Patient Education method: Explanation, Demonstration, and Handouts Education comprehension: verbalized understanding, returned demonstration, and needs further education  HOME EXERCISE PROGRAM: Access Code: [email or web address] URL: [email or web address] Date: 08/26/2022 Prepared by: Pauletta Browns  Exercises - Seated Shoulder Pendulum Exercise  - 3-5 x daily - 7 x weekly - 1 sets - 20 reps - Gentle Horizontal Shoulder Pendulum  - 3-5 x daily - 7 x weekly - 1 sets - 20 reps - Gentle Vertical Shoulder Pendulum  - 3-5 x daily - 7 x weekly - 1 sets - 20 reps - Standing Scapular Retraction  - 5 x daily - 7 x weekly - 1 sets - 5 reps - 5 second hold - Supine Scapular Protraction in Flexion with Dumbbells  - 2-3 x daily - 7 x weekly - 1 sets - 10-20 reps - 3 seconds  hold - Sidelying Shoulder External Rotation Dumbbell  - 1-2 x daily - 7 x weekly - 2 sets - 10 reps - 1 hold  ASSESSMENT:  CLINICAL IMPRESSION: Trial of UBE today which pt tolerated well after first initial rotations.  Overall slow but steady improvements noted in muscle activation and activity tolerance.  Will continue to benefit from PT to maximize function.  OBJECTIVE IMPAIRMENTS: decreased ROM, decreased strength, hypomobility, increased edema, increased fascial restrictions, increased muscle spasms, impaired flexibility, impaired UE functional use, postural dysfunction, obesity, and pain.   ACTIVITY LIMITATIONS: carrying, lifting, bed mobility, bathing, toileting, dressing, reach over head, and caring for others  PARTICIPATION LIMITATIONS: cleaning, laundry, driving, shopping, community activity, and yard work  PERSONAL FACTORS: Age, Behavior pattern, Education, Fitness, Past/current experiences, Sex, Social background, and Time since onset of injury/illness/exacerbation are also affecting patient's functional outcome.   REHAB POTENTIAL: Good  CLINICAL DECISION MAKING: Stable/uncomplicated  EVALUATION COMPLEXITY: Low  GOALS: Goals reviewed with patient? Yes  SHORT TERM GOALS: Target date: 09/02/2022   Will be compliant with appropriate progressive HEP  Baseline: Goal status: Met 08/21/2022  2.  R shoulder flexion and scaption PROM to be at least 100 degrees, R shoulder ER PROM to be at least 20 degrees at 0* ABD  Baseline:  Goal status: Met 08/13/2022  3.  R shoulder flexion and scaption AAROM to be at least 90 degrees, R shoulder ER AAROM to reach neutral  Baseline:  Goal status: MET 08/25/2022   4.  Will demonstrate better functional posture/postural mechanics  Baseline:  Goal status: On Going 09/10/2022   LONG TERM GOALS: Target date: 10/09/2022    R shoulder flexion and scaption AROM to be at least 120 degrees  Baseline:  Goal status: On Going 09/10/2022  2.  R  shoulder ER AROM to be at least 30 degrees at 45* ABD  Baseline:  Goal status: Met 09/10/2022  3.  Will be able to reach L5 level with active FIR no increased pain  Baseline:  Goal status: Ongoing 09/10/22  4.  Will be able to perform all dressing/bathing tasks on a Mod(I) basis no increase in pain  Baseline:  Goal status: Ongoing 09/10/22  5.  Will be able to reach overhead to put dishes in the cabinet without increase in pain  Baseline:  Goal status: Ongoing 09/10/22   PLAN:  PT FREQUENCY: 2 x per week  PT DURATION: 4 weeks  PLANNED INTERVENTIONS: Therapeutic exercises, Therapeutic activity, Neuromuscular re-education, Balance training, Gait training, Patient/Family education, Self Care, Joint mobilization, Aquatic Therapy, Dry Needling, Electrical stimulation, Cryotherapy, Moist heat, Taping, Ultrasound, Ionotophoresis 4mg /ml Dexamethasone, Manual therapy, and Re-evaluation  PLAN FOR NEXT SESSION:   okay to start AROM as tolerated Scapular strength/functional work (without external resistance).  Progress per Dr. Serena Croissant protocol (check MD notes to see if able to move into next phase)  NEXT MD VISIT: 11/11/22  Clarita Crane, PT, DPT 09/14/22 12:25 PM

## 2022-09-16 ENCOUNTER — Ambulatory Visit (INDEPENDENT_AMBULATORY_CARE_PROVIDER_SITE_OTHER): Payer: Medicare HMO | Admitting: Physical Therapy

## 2022-09-16 ENCOUNTER — Encounter: Payer: Self-pay | Admitting: Physical Therapy

## 2022-09-16 DIAGNOSIS — M6281 Muscle weakness (generalized): Secondary | ICD-10-CM

## 2022-09-16 DIAGNOSIS — R293 Abnormal posture: Secondary | ICD-10-CM | POA: Diagnosis not present

## 2022-09-16 DIAGNOSIS — M25611 Stiffness of right shoulder, not elsewhere classified: Secondary | ICD-10-CM | POA: Diagnosis not present

## 2022-09-16 DIAGNOSIS — M25511 Pain in right shoulder: Secondary | ICD-10-CM

## 2022-09-16 NOTE — Therapy (Signed)
OUTPATIENT PHYSICAL THERAPY SHOULDER TREATMENT  Patient Name: Lacey [name] MRN: [id number] DOB:August 14, 1950, 72 y.o., female Today's Date: 09/16/2022  END OF SESSION:  PT End of Session - 09/16/22 0803     Visit Number 20    Number of Visits 25    Date for PT Re-Evaluation 10/09/22    Authorization Type [employer] MCR    Progress Note Due on Visit 27    PT Start Time 0800    PT Stop Time 0840    PT Time Calculation (min) 40 min    Activity Tolerance Patient tolerated treatment well;No increased pain;Patient limited by fatigue    Behavior During Therapy [facility] for tasks assessed/performed               .    Past Medical History:  Diagnosis Date   Adenomatous colon polyp    hyperplastic   Anemia    Anginal pain (HCC)    not recent   Arthritis    NECK, KNEES, FINGERS, TOES   Atrial tachycardia CARDIOLOGIST - DR Graciela Husbands (LAST VISIT AUG 2012)   Echo 12/11: EF 55-60%, Mild LVH, grade 1 diast dysfxn;   holter 1/12: ATach   Atypical chest pain    a. 07/2004 Cath: Clean cors;  b. 04/2010 Myoview: EF 63%, no ischemia   Blood transfusion without reported diagnosis 1969   CHF (congestive heart failure) (HCC)    Chronic kidney disease    left kidney small    Coronary artery disease    Diabetes mellitus, type 2 (HCC)    ORAL AND INSULIN MEDS (LAST A1C  7.3)   Diverticulosis    Erythema CURRENT-- CLOSED ABD. WALL ABSCESS   PER PCP NOTE (03-31-11)- MRSA-- TAKES DOXYCYCLINE   GERD (gastroesophageal reflux disease)    CONTROLLED W/ PREVACID   History of kidney stones 2012   Hyperlipidemia    Hypertension    Insomnia    TAKES MEDS PRN   Neuropathy, peripheral    Obesity (BMI 30-39.9) 02/19/2015   Pneumonia    as child   Post op infection 11/07/2012   left bunionectomy   Restless leg syndrome    Sepsis, urinary HISTORY - 2004   Sleep apnea    no cpap    Thyroid disease    TIA (transient ischemic attack)    "mini strokes" 2023   Tremor    Vitamin D deficiency    Past Surgical  History:  Procedure Laterality Date   ABDOMINAL HYSTERECTOMY  1995   ovaries remain   BUNIONECTOMY Left 10/24/2012   BUNIONECTOMY Right 05/17/2017   CARDIAC CATHETERIZATION  07/10/2004   CARPAL TUNNEL RELEASE  2000   RIGHT   CATARACT EXTRACTION, BILATERAL     CERVICAL FUSION  10/12/2007   C5 - 7   COLONOSCOPY     CYSTOSCOPY WITH RETROGRADE PYELOGRAM, URETEROSCOPY AND STENT PLACEMENT Left 11/28/2012   Procedure: LEFT RETROGRADE PYELOGRAM, LEFT URETEROSCOPY, ;  Surgeon: Garnett Farm, MD;  Location: [facility];  Service: Urology;  Laterality: Left;   CYSTOSCOPY/RETROGRADE/URETEROSCOPY/STONE EXTRACTION WITH BASKET  X2 2004 & 2009   LEFT    GASTRIC BYPASS  1981   KNEE ARTHROSCOPY  12/2010   LEFT   LAPAROSCOPIC CHOLECYSTECTOMY  2001   LEFT HEART CATH AND CORONARY ANGIOGRAPHY N/A 12/03/2021   Procedure: LEFT HEART CATH AND CORONARY ANGIOGRAPHY;  Surgeon: Corky Crafts, MD;  Location[facility];  Service: Cardiovascular;  Laterality: N/A;   left thumb joint surgery  2013  PERCUTANEOUS NEPHROSTOLITHOTOMY  02/27/2011   LEFT   POLYPECTOMY     REVERSE SHOULDER ARTHROPLASTY Right 07/12/2022   Procedure: REVERSE SHOULDER ARTHROPLASTY;  Surgeon: Huel Cote, MD;  Location: [facility];  Service: Orthopedics;  Laterality: Right;   RIGHT THUMB JOINT SURG.  09/2009   TRIGGER FINGER RELEASE  2010   RIGHT THUMB   UPPER GASTROINTESTINAL ENDOSCOPY     URETERAL STENT PLACEMENT  X2  2004  &  2009   LEFT   URETEROSCOPY  04/06/2011   Procedure: URETEROSCOPY;  Surgeon: Garnett Farm, MD;  Location: [facility];  Service: Urology;  Laterality: Left;  L Ureteroscopy Laser Litho & Stent    Patient Active Problem List   Diagnosis Date Noted   Anxiety 08/19/2022   Closed 3-part fracture of proximal end of right humerus 07/11/2022   Shoulder dislocation 07/11/2022   Abnormal stress test    Atypical chest pain 08/05/2021   Bradycardia 06/20/2021   Abnormal gait 03/17/2021    Acquired deformity of lower leg 03/17/2021   Urinary urgency 11/14/2020   Paresthesia 08/15/2020   Right lumbar pain 08/15/2020   Tremor 08/15/2020   Hypoglycemia associated with diabetes (HCC) 02/29/2020   Essential (primary) hypertension 08/10/2019   Chronic neck pain 06/23/2016   Radicular low back pain 07/12/2015   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 without macular edema (HCC) 04/24/2015   Severe obesity (BMI >= 40) (HCC) 02/19/2015   OSA (obstructive sleep apnea) 10/23/2014   Excessive daytime sleepiness 10/23/2014   Hypothyroidism 09/07/2014   Tobacco abuse 01/15/2014   Recurrent UTI 09/06/2013   Orthostatic hypotension 10/05/2012   General medical examination 06/22/2011   Atrial tachycardia 11/25/2010   Pre-operative cardiovascular examination 11/25/2010   Chronic diarrhea 08/25/2010   KNEE PAIN 07/14/2010   Knee pain 07/14/2010   DIASTOLIC HEART FAILURE, CHRONIC 06/09/2010   PERSONAL HISTORY OF COLONIC POLYPS 05/01/2010   Vitamin D deficiency 01/20/2010   RESTLESS LEG SYNDROME 01/20/2010   Insomnia 01/20/2010   Restless leg syndrome 01/20/2010   Hyperlipidemia associated with type 2 diabetes mellitus (HCC) 01/08/2009   Anemia 01/08/2009   GERD 01/08/2009   Benign hypertensive heart disease with heart failure (HCC) 01/08/2009    PCP: Neena Rhymes MD   REFERRING PROVIDER: Huel Cote, MD  REFERRING DIAG: [phone] (ICD-10-CM) - Status post reverse arthroplasty of right shoulder  THERAPY DIAG:  Acute pain of right shoulder  Muscle weakness (generalized)  Abnormal posture  Stiffness of right shoulder, not elsewhere classified  Rationale for Evaluation and Treatment: Rehabilitation  ONSET DATE: Date of Surgery: 07/12/22  SUBJECTIVE:  SUBJECTIVE STATEMENT: Doing well; wasn't sore after last session.  Feels like she can use her arm more (turning light switches off)  Hand dominance: Right  PERTINENT HISTORY: 72 year old female is 2 weeks status post right shoulder reverse shoulder arthroplasty overall doing very well. Because this is a reverse for fracture I will asked that she remain in the sling for an additional 2 weeks. She may come out of this for passive range of motion at this time 1 month postop. That time she will begin following the reverse for fracture protocol. Will plan on outpatient physical therapy.  PAIN:  Are you having pain? Yes: NPRS scale: denies pain; just soreness Pain location: R shoulder and around R collar bone    Pain description:  unclear  Aggravating factors: unclear   Relieving factors: unclear     PRECAUTIONS: Other: reverse total shoulder for fracture protocol   WEIGHT BEARING RESTRICTIONS: Yes NWB surgical LE   FALLS:  Has patient fallen in last 6 months? Yes. Number of falls 2- one fall on ramp in the rain that led to fracture/surgery, other fall was the night she came home from the hospital and just passed out; FOF (+)  LIVING ENVIRONMENT: Lives with: lives with their spouse Lives in: House/apartment Stairs: No Has following equipment at home: Single point cane, Environmental consultant - 2 wheeled, Crutches, Wheelchair (manual), bed side commode, and Ramped entry + walk-in shower, shower bench   OCCUPATION: Retired   PLOF: Independent, Independent with basic ADLs, Independent with gait, and Independent with transfers  PATIENT GOALS: move my arm, be able to wash dishes, brush teeth R handed- generally improve level of function    OBJECTIVE:   PATIENT SURVEYS:  09/10/2022 FOTO 57 (Goal met)  08/25/22 FOTO 49  EVAL: FOTO 32, predicted 57  COGNITION: Overall cognitive status: Within functional limits for tasks assessed and very anxious about therapy      SENSATION: Reports  numbness and burning in upper arm since surgery   POSTURE: Rounded shoulders, forward head, increased thoracic kyphosis   UPPER EXTREMITY ROM:   Passive ROM Right eval Right 08/11/22 Right 08/13/2022 Right 08/17/22 Right 08/21/2022 Right 08/25/22 Right 08/26/2022 Right 09/02/22 Left/Right 09/10/2022 assessed supine   Shoulder flexion 45-50* P: 87 100 P: 110 Passive 110 AA: 136 110 PROM 120-130* supine, AAROM 120* supine   150/130   Shoulder scaption <20*       PROM 130-140*, 110* AAROM    Shoulder abduction  P: 78  P: 110  AA: 143 (Scaption)     Shoulder internal rotation   60  65  65  95/65  Shoulder external rotation -10* to -15* (edited 08/06/22, noted that I put ER value in IR column KEU) P: 20 30  35 AA: 35 40 PROM with arm at side 40-45*; AAROM 30-40*  60/50  Elbow flexion About 75% limited PROM, very guarded           Elbow extension WNL PROM but very guarded           (Blank rows = not tested)  UPPER EXTREMITY STRENGTH:  In pounds with hand-held dynamometer Right 09/10/2022 Left 09/10/2022  Shoulder flexion    Shoulder extension    Shoulder abduction    Shoulder adduction    Shoulder internal rotation 8.5 23.8  Shoulder external rotation < 3 pounds 18.5  (Blank rows = not tested)- DNT at eval due to limitations in protocol    TODAY'S TREATMENT:  DATE:  09/16/22 TherEx UBE L1 x 8 min; forward only trying to use LLE mainly for RUE motion; seat 6 Rows L1 band 2x10; 3 sec hold Standing wall ladder 15-19 steps x 10 reps; flexion only; assist at Rt elbow to decrease arm dropping Standing AA chest press with 1# bar 2x10 reps Standing AA shoulder flexion with 1# bar 2x10 Supine chest press into overhead flexion to tolerance AA with 1# bar 2x10 Supine shoulder isometrics  (extension, flexion, abdct, ER/IR) 10 x 5 sec hold each  09/14/22 TherEx UBE L1 x 8  min; forward only trying to use LLE mainly for RUE motion Seated scapular retraction 2x10; 5 sec hold Seated AA abduction with PT providing concentric assist x 10 reps; in elbow flexion Seated AA abduction with elbow flexion and 1# bar x 10 reps Standing wall ladder 12-16 steps x 10 reps; flexion only; assist at Rt elbow to decrease arm dropping Supine chest press 1# bar 2x10 Supine shoulder isometrics  (extension, flexion, abdct, ER/IR) 10 x 5 sec hold each Supine active flexion with elbow flexed; limited range but improved each rep   09/11/22 TherEx Seated scapular retraction 2x10, 5 sec hold Seated Rt bicep curl 2x10 Supine AA shoulder flexion with elbow flexion 2x10 Supine shoulder isometrics  (extension, flexion, abdct, ER/IR) 10 x 5 sec hold each AA supine shoulder protraction 2x10 AA shoulder abdct to 90 in sidelying x 10 AA shoulder abdct with elbow flexion in sidelying to 90 x 10 Active sidelying ER (limited range) 2x10 Seated abduction with elbow flexion (limited range) 2x10  09/10/2022 Active assisted supine arm raises (scapular protraction with palm facing in and left side assisting) 3 sets of 20 for 3 seconds  Side lie ER (lie left, keep Rt elbow bent 90 degrees and rotate up as far as possible) 2 sets of 10 with PT assist  Shoulder blade pinches 10 for 5 seconds  Supine active assisted shoulder flexion (left side helps) 10X 10 seconds  Supine active shoulder ER 10X 10 seconds  PATIENT EDUCATION: Education details: exam findings, POC, HEP; extensive education on typical recovery after shoulder surgery, also benefit of taking pain medicine prior to PT to make skilled interventions more tolerable  Person educated: Patient Education method: Explanation, Demonstration, and Handouts Education comprehension: verbalized understanding, returned demonstration, and needs further education  HOME EXERCISE PROGRAM: Access Code: [email or web address] URL:  [email or web address] Date: 08/26/2022 Prepared by: Pauletta Browns  Exercises - Seated Shoulder Pendulum Exercise  - 3-5 x daily - 7 x weekly - 1 sets - 20 reps - Gentle Horizontal Shoulder Pendulum  - 3-5 x daily - 7 x weekly - 1 sets - 20 reps - Gentle Vertical Shoulder Pendulum  - 3-5 x daily - 7 x weekly - 1 sets - 20 reps - Standing Scapular Retraction  - 5 x daily - 7 x weekly - 1 sets - 5 reps - 5 second hold - Supine Scapular Protraction in Flexion with Dumbbells  - 2-3 x daily - 7 x weekly - 1 sets - 10-20 reps - 3 seconds hold - Sidelying Shoulder External Rotation Dumbbell  - 1-2 x daily - 7 x weekly - 2 sets - 10 reps - 1 hold  ASSESSMENT:  CLINICAL IMPRESSION: Pt tolerated session well today with continued progress in active exercises noted.  Continues to have difficulty with flexion in standing.  Will continue to benefit from PT to maximize function.  OBJECTIVE IMPAIRMENTS: decreased ROM, decreased strength, hypomobility, increased edema, increased fascial  restrictions, increased muscle spasms, impaired flexibility, impaired UE functional use, postural dysfunction, obesity, and pain.   ACTIVITY LIMITATIONS: carrying, lifting, bed mobility, bathing, toileting, dressing, reach over head, and caring for others  PARTICIPATION LIMITATIONS: cleaning, laundry, driving, shopping, community activity, and yard work  PERSONAL FACTORS: Age, Behavior pattern, Education, Fitness, Past/current experiences, Sex, Social background, and Time since onset of injury/illness/exacerbation are also affecting patient's functional outcome.   REHAB POTENTIAL: Good  CLINICAL DECISION MAKING: Stable/uncomplicated  EVALUATION COMPLEXITY: Low   GOALS: Goals reviewed with patient? Yes  SHORT TERM GOALS: Target date: 09/02/2022   Will be compliant with appropriate progressive HEP  Baseline: Goal status: Met 08/21/2022  2.  R shoulder flexion and scaption PROM to be at least 100  degrees, R shoulder ER PROM to be at least 20 degrees at 0* ABD  Baseline:  Goal status: Met 08/13/2022  3.  R shoulder flexion and scaption AAROM to be at least 90 degrees, R shoulder ER AAROM to reach neutral  Baseline:  Goal status: MET 08/25/2022   4.  Will demonstrate better functional posture/postural mechanics  Baseline:  Goal status: On Going 09/10/2022   LONG TERM GOALS: Target date: 10/09/2022    R shoulder flexion and scaption AROM to be at least 120 degrees  Baseline:  Goal status: On Going 09/10/2022  2.  R shoulder ER AROM to be at least 30 degrees at 45* ABD  Baseline:  Goal status: Met 09/10/2022  3.  Will be able to reach L5 level with active FIR no increased pain  Baseline:  Goal status: Ongoing 09/10/22  4.  Will be able to perform all dressing/bathing tasks on a Mod(I) basis no increase in pain  Baseline:  Goal status: Ongoing 09/10/22  5.  Will be able to reach overhead to put dishes in the cabinet without increase in pain  Baseline:  Goal status: Ongoing 09/10/22   PLAN:  PT FREQUENCY: 2 x per week  PT DURATION: 4 weeks  PLANNED INTERVENTIONS: Therapeutic exercises, Therapeutic activity, Neuromuscular re-education, Balance training, Gait training, Patient/Family education, Self Care, Joint mobilization, Aquatic Therapy, Dry Needling, Electrical stimulation, Cryotherapy, Moist heat, Taping, Ultrasound, Ionotophoresis 4mg /ml Dexamethasone, Manual therapy, and Re-evaluation  PLAN FOR NEXT SESSION:  continue ROM exercises as tolerated;  okay to start AROM as tolerated Scapular strength/functional work (without external resistance).  Progress per Dr. Serena Croissant protocol (check MD notes to see if able to move into next phase)  NEXT MD VISIT: 11/11/22  Clarita Crane, PT, DPT 09/16/22 8:43 AM

## 2022-09-22 ENCOUNTER — Ambulatory Visit: Payer: Medicare HMO | Admitting: Physical Therapy

## 2022-09-22 ENCOUNTER — Encounter: Payer: Self-pay | Admitting: Physical Therapy

## 2022-09-22 DIAGNOSIS — M25511 Pain in right shoulder: Secondary | ICD-10-CM

## 2022-09-22 DIAGNOSIS — R293 Abnormal posture: Secondary | ICD-10-CM

## 2022-09-22 DIAGNOSIS — M25611 Stiffness of right shoulder, not elsewhere classified: Secondary | ICD-10-CM | POA: Diagnosis not present

## 2022-09-22 DIAGNOSIS — M6281 Muscle weakness (generalized): Secondary | ICD-10-CM | POA: Diagnosis not present

## 2022-09-22 NOTE — Therapy (Signed)
OUTPATIENT PHYSICAL THERAPY SHOULDER TREATMENT  Patient Name: Lacey [name] MRN: [id number] DOB:05-02-1951, 72 y.o., female Today's Date: 09/22/2022  END OF SESSION:  PT End of Session - 09/22/22 1015     Visit Number 21    Number of Visits 25    Date for PT Re-Evaluation 10/09/22    Authorization Type [employer] MCR    Progress Note Due on Visit 27    PT Start Time 1015    PT Stop Time 1053    PT Time Calculation (min) 38 min    Activity Tolerance Patient tolerated treatment well;No increased pain;Patient limited by fatigue    Behavior During Therapy [facility] for tasks assessed/performed                .    Past Medical History:  Diagnosis Date   Adenomatous colon polyp    hyperplastic   Anemia    Anginal pain (HCC)    not recent   Arthritis    NECK, KNEES, FINGERS, TOES   Atrial tachycardia CARDIOLOGIST - DR Graciela Husbands (LAST VISIT AUG 2012)   Echo 12/11: EF 55-60%, Mild LVH, grade 1 diast dysfxn;   holter 1/12: ATach   Atypical chest pain    a. 07/2004 Cath: Clean cors;  b. 04/2010 Myoview: EF 63%, no ischemia   Blood transfusion without reported diagnosis 1969   CHF (congestive heart failure) (HCC)    Chronic kidney disease    left kidney small    Coronary artery disease    Diabetes mellitus, type 2 (HCC)    ORAL AND INSULIN MEDS (LAST A1C  7.3)   Diverticulosis    Erythema CURRENT-- CLOSED ABD. WALL ABSCESS   PER PCP NOTE (03-31-11)- MRSA-- TAKES DOXYCYCLINE   GERD (gastroesophageal reflux disease)    CONTROLLED W/ PREVACID   History of kidney stones 2012   Hyperlipidemia    Hypertension    Insomnia    TAKES MEDS PRN   Neuropathy, peripheral    Obesity (BMI 30-39.9) 02/19/2015   Pneumonia    as child   Post op infection 11/07/2012   left bunionectomy   Restless leg syndrome    Sepsis, urinary HISTORY - 2004   Sleep apnea    no cpap    Thyroid disease    TIA (transient ischemic attack)    "mini strokes" 2023   Tremor    Vitamin D deficiency    Past  Surgical History:  Procedure Laterality Date   ABDOMINAL HYSTERECTOMY  1995   ovaries remain   BUNIONECTOMY Left 10/24/2012   BUNIONECTOMY Right 05/17/2017   CARDIAC CATHETERIZATION  07/10/2004   CARPAL TUNNEL RELEASE  2000   RIGHT   CATARACT EXTRACTION, BILATERAL     CERVICAL FUSION  10/12/2007   C5 - 7   COLONOSCOPY     CYSTOSCOPY WITH RETROGRADE PYELOGRAM, URETEROSCOPY AND STENT PLACEMENT Left 11/28/2012   Procedure: LEFT RETROGRADE PYELOGRAM, LEFT URETEROSCOPY, ;  Surgeon: Garnett Farm, MD;  Location: [facility];  Service: Urology;  Laterality: Left;   CYSTOSCOPY/RETROGRADE/URETEROSCOPY/STONE EXTRACTION WITH BASKET  X2 2004 & 2009   LEFT    GASTRIC BYPASS  1981   KNEE ARTHROSCOPY  12/2010   LEFT   LAPAROSCOPIC CHOLECYSTECTOMY  2001   LEFT HEART CATH AND CORONARY ANGIOGRAPHY N/A 12/03/2021   Procedure: LEFT HEART CATH AND CORONARY ANGIOGRAPHY;  Surgeon: Corky Crafts, MD;  Location: [facility] INVASIVE CV LAB;  Service: Cardiovascular;  Laterality: N/A;   left thumb joint surgery  2013  PERCUTANEOUS NEPHROSTOLITHOTOMY  02/27/2011   LEFT   POLYPECTOMY     REVERSE SHOULDER ARTHROPLASTY Right 07/12/2022   Procedure: REVERSE SHOULDER ARTHROPLASTY;  Surgeon: Huel Cote, MD;  Location: [facility] OR;  Service: Orthopedics;  Laterality: Right;   RIGHT THUMB JOINT SURG.  09/2009   TRIGGER FINGER RELEASE  2010   RIGHT THUMB   UPPER GASTROINTESTINAL ENDOSCOPY     URETERAL STENT PLACEMENT  X2  2004  &  2009   LEFT   URETEROSCOPY  04/06/2011   Procedure: URETEROSCOPY;  Surgeon: Garnett Farm, MD;  Location: [facility]  Service: Urology;  Laterality: Left;  L Ureteroscopy Laser Litho & Stent    Patient Active Problem List   Diagnosis Date Noted   Anxiety 08/19/2022   Closed 3-part fracture of proximal end of right humerus 07/11/2022   Shoulder dislocation 07/11/2022   Abnormal stress test    Atypical chest pain 08/05/2021   Bradycardia 06/20/2021   Abnormal gait  03/17/2021   Acquired deformity of lower leg 03/17/2021   Urinary urgency 11/14/2020   Paresthesia 08/15/2020   Right lumbar pain 08/15/2020   Tremor 08/15/2020   Hypoglycemia associated with diabetes (HCC) 02/29/2020   Essential (primary) hypertension 08/10/2019   Chronic neck pain 06/23/2016   Radicular low back pain 07/12/2015   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 without macular edema (HCC) 04/24/2015   Severe obesity (BMI >= 40) (HCC) 02/19/2015   OSA (obstructive sleep apnea) 10/23/2014   Excessive daytime sleepiness 10/23/2014   Hypothyroidism 09/07/2014   Tobacco abuse 01/15/2014   Recurrent UTI 09/06/2013   Orthostatic hypotension 10/05/2012   General medical examination 06/22/2011   Atrial tachycardia 11/25/2010   Pre-operative cardiovascular examination 11/25/2010   Chronic diarrhea 08/25/2010   KNEE PAIN 07/14/2010   Knee pain 07/14/2010   DIASTOLIC HEART FAILURE, CHRONIC 06/09/2010   PERSONAL HISTORY OF COLONIC POLYPS 05/01/2010   Vitamin D deficiency 01/20/2010   RESTLESS LEG SYNDROME 01/20/2010   Insomnia 01/20/2010   Restless leg syndrome 01/20/2010   Hyperlipidemia associated with type 2 diabetes mellitus (HCC) 01/08/2009   Anemia 01/08/2009   GERD 01/08/2009   Benign hypertensive heart disease with heart failure (HCC) 01/08/2009    PCP: Neena Rhymes MD   REFERRING PROVIDER: Huel Cote, MD  REFERRING DIAG: [phone] (ICD-10-CM) - Status post reverse arthroplasty of right shoulder  THERAPY DIAG:  Acute pain of right shoulder  Muscle weakness (generalized)  Abnormal posture  Stiffness of right shoulder, not elsewhere classified  Rationale for Evaluation and Treatment: Rehabilitation  ONSET DATE: Date of Surgery: 07/12/22  SUBJECTIVE:  SUBJECTIVE STATEMENT: Shoulder "hurt me all day yesterday." Reports it's better today but still painful  Hand dominance: Right  PERTINENT HISTORY: 72 year old female is 2 weeks status post right shoulder reverse shoulder arthroplasty overall doing very well. Because this is a reverse for fracture I will asked that she remain in the sling for an additional 2 weeks. She may come out of this for passive range of motion at this time 1 month postop. That time she will begin following the reverse for fracture protocol. Will plan on outpatient physical therapy.  PAIN:  Are you having pain? Yes: NPRS scale: 4/10 Pain location: R shoulder and around R collar bone    Pain description:  unclear  Aggravating factors: unclear   Relieving factors: unclear     PRECAUTIONS: Other: reverse total shoulder for fracture protocol   WEIGHT BEARING RESTRICTIONS: Yes NWB surgical LE   FALLS:  Has patient fallen in last 6 months? Yes. Number of falls 2- one fall on ramp in the rain that led to fracture/surgery, other fall was the night she came home from the hospital and just passed out; FOF (+)  LIVING ENVIRONMENT: Lives with: lives with their spouse Lives in: House/apartment Stairs: No Has following equipment at home: Single point cane, Environmental consultant - 2 wheeled, Crutches, Wheelchair (manual), bed side commode, and Ramped entry + walk-in shower, shower bench   OCCUPATION: Retired   PLOF: Independent, Independent with basic ADLs, Independent with gait, and Independent with transfers  PATIENT GOALS: move my arm, be able to wash dishes, brush teeth R handed- generally improve level of function    OBJECTIVE:   PATIENT SURVEYS:  09/10/2022 FOTO 57 (Goal met)  08/25/22 FOTO 49  EVAL: FOTO 32, predicted 57  COGNITION: Overall cognitive status: Within functional limits for tasks assessed and very anxious about therapy      SENSATION: Reports numbness and burning in upper arm since  surgery   POSTURE: Rounded shoulders, forward head, increased thoracic kyphosis   UPPER EXTREMITY ROM:   Passive ROM Right eval Right 08/11/22 Right 08/13/2022 Right 08/17/22 Right 08/21/2022 Right 08/25/22 Right 08/26/2022 Right 09/02/22 Left/Right 09/10/2022 assessed supine   Shoulder flexion 45-50* P: 87 100 P: 110 Passive 110 AA: 136 110 PROM 120-130* supine, AAROM 120* supine   150/130   Shoulder scaption <20*       PROM 130-140*, 110* AAROM    Shoulder abduction  P: 78  P: 110  AA: 143 (Scaption)     Shoulder internal rotation   60  65  65  95/65  Shoulder external rotation -10* to -15* (edited 08/06/22, noted that I put ER value in IR column KEU) P: 20 30  35 AA: 35 40 PROM with arm at side 40-45*; AAROM 30-40*  60/50  Elbow flexion About 75% limited PROM, very guarded           Elbow extension WNL PROM but very guarded           (Blank rows = not tested)  UPPER EXTREMITY STRENGTH:  In pounds with hand-held dynamometer Right 09/10/2022 Left 09/10/2022  Shoulder flexion    Shoulder extension    Shoulder abduction    Shoulder adduction    Shoulder internal rotation 8.5 23.8  Shoulder external rotation < 3 pounds 18.5  (Blank rows = not tested)- DNT at eval due to limitations in protocol    TODAY'S TREATMENT:  DATE:  09/22/22 TherEx UBE L1 x 8 min; forward only trying to use LLE mainly for RUE motion; seat 6 Standing wall ladder 15-19 steps x 10 reps; flexion only; assist at Rt elbow to decrease arm dropping Standing AA chest press with 1# bar 2x10 reps (limited range) Standing AA shoulder flexion with 1# bar 2x10 (PT assist with RUE) Supine chest press into overhead flexion to tolerance AA with 1# bar 2x10 Supine active shoulder flexion, min A for eccentric control x 10 reps Supine bicep stretch x 2 min   09/16/22 TherEx UBE L1 x 8 min; forward  only trying to use LLE mainly for RUE motion; seat 6 Rows L1 band 2x10; 3 sec hold Standing wall ladder 15-19 steps x 10 reps; flexion only; assist at Rt elbow to decrease arm dropping Standing AA chest press with 1# bar 2x10 reps Standing AA shoulder flexion with 1# bar 2x10 Supine chest press into overhead flexion to tolerance AA with 1# bar 2x10 Supine shoulder isometrics  (extension, flexion, abdct, ER/IR) 10 x 5 sec hold each  09/14/22 TherEx UBE L1 x 8 min; forward only trying to use LLE mainly for RUE motion Seated scapular retraction 2x10; 5 sec hold Seated AA abduction with PT providing concentric assist x 10 reps; in elbow flexion Seated AA abduction with elbow flexion and 1# bar x 10 reps Standing wall ladder 12-16 steps x 10 reps; flexion only; assist at Rt elbow to decrease arm dropping Supine chest press 1# bar 2x10 Supine shoulder isometrics  (extension, flexion, abdct, ER/IR) 10 x 5 sec hold each Supine active flexion with elbow flexed; limited range but improved each rep   09/11/22 TherEx Seated scapular retraction 2x10, 5 sec hold Seated Rt bicep curl 2x10 Supine AA shoulder flexion with elbow flexion 2x10 Supine shoulder isometrics  (extension, flexion, abdct, ER/IR) 10 x 5 sec hold each AA supine shoulder protraction 2x10 AA shoulder abdct to 90 in sidelying x 10 AA shoulder abdct with elbow flexion in sidelying to 90 x 10 Active sidelying ER (limited range) 2x10 Seated abduction with elbow flexion (limited range) 2x10  09/10/2022 Active assisted supine arm raises (scapular protraction with palm facing in and left side assisting) 3 sets of 20 for 3 seconds  Side lie ER (lie left, keep Rt elbow bent 90 degrees and rotate up as far as possible) 2 sets of 10 with PT assist  Shoulder blade pinches 10 for 5 seconds  Supine active assisted shoulder flexion (left side helps) 10X 10 seconds  Supine active shoulder ER 10X 10 seconds  PATIENT EDUCATION: Education  details: exam findings, POC, HEP; extensive education on typical recovery after shoulder surgery, also benefit of taking pain medicine prior to PT to make skilled interventions more tolerable  Person educated: Patient Education method: Explanation, Demonstration, and Handouts Education comprehension: verbalized understanding, returned demonstration, and needs further education  HOME EXERCISE PROGRAM: Access Code: [email or web address] URL: [email or web address] Date: 08/26/2022 Prepared by: Pauletta Browns  Exercises - Seated Shoulder Pendulum Exercise  - 3-5 x daily - 7 x weekly - 1 sets - 20 reps - Gentle Horizontal Shoulder Pendulum  - 3-5 x daily - 7 x weekly - 1 sets - 20 reps - Gentle Vertical Shoulder Pendulum  - 3-5 x daily - 7 x weekly - 1 sets - 20 reps - Standing Scapular Retraction  - 5 x daily - 7 x weekly - 1 sets - 5 reps - 5 second hold - Supine Scapular Protraction in Flexion  with Dumbbells  - 2-3 x daily - 7 x weekly - 1 sets - 10-20 reps - 3 seconds hold - Sidelying Shoulder External Rotation Dumbbell  - 1-2 x daily - 7 x weekly - 2 sets - 10 reps - 1 hold  ASSESSMENT:  CLINICAL IMPRESSION: Pt with increased soreness today especially in bicep so needing increased assistance with AROM exercises.  Recommended light stretching and conscious awareness to extend elbow with ambulation as pt tends to walk in elbow flexion.  Will continue to benefit from PT to maximize function.  OBJECTIVE IMPAIRMENTS: decreased ROM, decreased strength, hypomobility, increased edema, increased fascial restrictions, increased muscle spasms, impaired flexibility, impaired UE functional use, postural dysfunction, obesity, and pain.   ACTIVITY LIMITATIONS: carrying, lifting, bed mobility, bathing, toileting, dressing, reach over head, and caring for others  PARTICIPATION LIMITATIONS: cleaning, laundry, driving, shopping, community activity, and yard work  PERSONAL FACTORS: Age, Behavior pattern,  Education, Fitness, Past/current experiences, Sex, Social background, and Time since onset of injury/illness/exacerbation are also affecting patient's functional outcome.   REHAB POTENTIAL: Good  CLINICAL DECISION MAKING: Stable/uncomplicated  EVALUATION COMPLEXITY: Low   GOALS: Goals reviewed with patient? Yes  SHORT TERM GOALS: Target date: 09/02/2022   Will be compliant with appropriate progressive HEP  Baseline: Goal status: Met 08/21/2022  2.  R shoulder flexion and scaption PROM to be at least 100 degrees, R shoulder ER PROM to be at least 20 degrees at 0* ABD  Baseline:  Goal status: Met 08/13/2022  3.  R shoulder flexion and scaption AAROM to be at least 90 degrees, R shoulder ER AAROM to reach neutral  Baseline:  Goal status: MET 08/25/2022   4.  Will demonstrate better functional posture/postural mechanics  Baseline:  Goal status: On Going 09/10/2022   LONG TERM GOALS: Target date: 10/09/2022    R shoulder flexion and scaption AROM to be at least 120 degrees  Baseline:  Goal status: On Going 09/10/2022  2.  R shoulder ER AROM to be at least 30 degrees at 45* ABD  Baseline:  Goal status: Met 09/10/2022  3.  Will be able to reach L5 level with active FIR no increased pain  Baseline:  Goal status: Ongoing 09/10/22  4.  Will be able to perform all dressing/bathing tasks on a Mod(I) basis no increase in pain  Baseline:  Goal status: Ongoing 09/10/22  5.  Will be able to reach overhead to put dishes in the cabinet without increase in pain  Baseline:  Goal status: Ongoing 09/10/22   PLAN:  PT FREQUENCY: 2 x per week  PT DURATION: 4 weeks  PLANNED INTERVENTIONS: Therapeutic exercises, Therapeutic activity, Neuromuscular re-education, Balance training, Gait training, Patient/Family education, Self Care, Joint mobilization, Aquatic Therapy, Dry Needling, Electrical stimulation, Cryotherapy, Moist heat, Taping, Ultrasound, Ionotophoresis 4mg /ml Dexamethasone, Manual therapy,  and Re-evaluation  PLAN FOR NEXT SESSION:  how is bicep?, continue ROM exercises as tolerated;  okay to start AROM as tolerated Scapular strength/functional work (without external resistance).  Progress per Dr. Serena Croissant protocol (check MD notes to see if able to move into next phase)  NEXT MD VISIT: 11/11/22  Clarita Crane, PT, DPT 09/22/22 11:48 AM

## 2022-09-24 ENCOUNTER — Ambulatory Visit: Payer: Medicare HMO | Admitting: Physical Therapy

## 2022-09-24 ENCOUNTER — Encounter: Payer: Self-pay | Admitting: Physical Therapy

## 2022-09-24 DIAGNOSIS — M6281 Muscle weakness (generalized): Secondary | ICD-10-CM

## 2022-09-24 DIAGNOSIS — M25511 Pain in right shoulder: Secondary | ICD-10-CM | POA: Diagnosis not present

## 2022-09-24 DIAGNOSIS — R293 Abnormal posture: Secondary | ICD-10-CM

## 2022-09-24 DIAGNOSIS — M25611 Stiffness of right shoulder, not elsewhere classified: Secondary | ICD-10-CM | POA: Diagnosis not present

## 2022-09-24 NOTE — Therapy (Signed)
OUTPATIENT PHYSICAL THERAPY SHOULDER TREATMENT  Patient Name: Lacey [name] MRN: [id number] DOB:1950-08-15, 72 y.o., female Today's Date: 09/24/2022  END OF SESSION:  PT End of Session - 09/24/22 0845     Visit Number 22    Number of Visits 25    Date for PT Re-Evaluation 10/09/22    Authorization Type [employer] MCR    Progress Note Due on Visit 27    PT Start Time [phone]    Activity Tolerance Patient tolerated treatment well;No increased pain;Patient limited by fatigue    Behavior During Therapy [facility] for tasks assessed/performed                 .    Past Medical History:  Diagnosis Date   Adenomatous colon polyp    hyperplastic   Anemia    Anginal pain (HCC)    not recent   Arthritis    NECK, KNEES, FINGERS, TOES   Atrial tachycardia CARDIOLOGIST - DR Graciela Husbands (LAST VISIT AUG 2012)   Echo 12/11: EF 55-60%, Mild LVH, grade 1 diast dysfxn;   holter 1/12: ATach   Atypical chest pain    a. 07/2004 Cath: Clean cors;  b. 04/2010 Myoview: EF 63%, no ischemia   Blood transfusion without reported diagnosis 1969   CHF (congestive heart failure) (HCC)    Chronic kidney disease    left kidney small    Coronary artery disease    Diabetes mellitus, type 2 (HCC)    ORAL AND INSULIN MEDS (LAST A1C  7.3)   Diverticulosis    Erythema CURRENT-- CLOSED ABD. WALL ABSCESS   PER PCP NOTE (03-31-11)- MRSA-- TAKES DOXYCYCLINE   GERD (gastroesophageal reflux disease)    CONTROLLED W/ PREVACID   History of kidney stones 2012   Hyperlipidemia    Hypertension    Insomnia    TAKES MEDS PRN   Neuropathy, peripheral    Obesity (BMI 30-39.9) 02/19/2015   Pneumonia    as child   Post op infection 11/07/2012   left bunionectomy   Restless leg syndrome    Sepsis, urinary HISTORY - 2004   Sleep apnea    no cpap    Thyroid disease    TIA (transient ischemic attack)    "mini strokes" 2023   Tremor    Vitamin D deficiency    Past Surgical History:  Procedure Laterality Date   ABDOMINAL  HYSTERECTOMY  1995   ovaries remain   BUNIONECTOMY Left 10/24/2012   BUNIONECTOMY Right 05/17/2017   CARDIAC CATHETERIZATION  07/10/2004   CARPAL TUNNEL RELEASE  2000   RIGHT   CATARACT EXTRACTION, BILATERAL     CERVICAL FUSION  10/12/2007   C5 - 7   COLONOSCOPY     CYSTOSCOPY WITH RETROGRADE PYELOGRAM, URETEROSCOPY AND STENT PLACEMENT Left 11/28/2012   Procedure: LEFT RETROGRADE PYELOGRAM, LEFT URETEROSCOPY, ;  Surgeon: Garnett Farm, MD;  Location: [facility];  Service: Urology;  Laterality: Left;   CYSTOSCOPY/RETROGRADE/URETEROSCOPY/STONE EXTRACTION WITH BASKET  X2 2004 & 2009   LEFT    GASTRIC BYPASS  1981   KNEE ARTHROSCOPY  12/2010   LEFT   LAPAROSCOPIC CHOLECYSTECTOMY  2001   LEFT HEART CATH AND CORONARY ANGIOGRAPHY N/A 12/03/2021   Procedure: LEFT HEART CATH AND CORONARY ANGIOGRAPHY;  Surgeon: Corky Crafts, MD;  Location: [facility] INVASIVE CV LAB;  Service: Cardiovascular;  Laterality: N/A;   left thumb joint surgery  2013   PERCUTANEOUS NEPHROSTOLITHOTOMY  02/27/2011   LEFT   POLYPECTOMY  REVERSE SHOULDER ARTHROPLASTY Right 07/12/2022   Procedure: REVERSE SHOULDER ARTHROPLASTY;  Surgeon: Huel Cote, MD;  Location: [facility];  Service: Orthopedics;  Laterality: Right;   RIGHT THUMB JOINT SURG.  09/2009   TRIGGER FINGER RELEASE  2010   RIGHT THUMB   UPPER GASTROINTESTINAL ENDOSCOPY     URETERAL STENT PLACEMENT  X2  2004  &  2009   LEFT   URETEROSCOPY  04/06/2011   Procedure: URETEROSCOPY;  Surgeon: Garnett Farm, MD;  Location: [facility];  Service: Urology;  Laterality: Left;  L Ureteroscopy Laser Litho & Stent    Patient Active Problem List   Diagnosis Date Noted   Anxiety 08/19/2022   Closed 3-part fracture of proximal end of right humerus 07/11/2022   Shoulder dislocation 07/11/2022   Abnormal stress test    Atypical chest pain 08/05/2021   Bradycardia 06/20/2021   Abnormal gait 03/17/2021   Acquired deformity of lower leg 03/17/2021    Urinary urgency 11/14/2020   Paresthesia 08/15/2020   Right lumbar pain 08/15/2020   Tremor 08/15/2020   Hypoglycemia associated with diabetes (HCC) 02/29/2020   Essential (primary) hypertension 08/10/2019   Chronic neck pain 06/23/2016   Radicular low back pain 07/12/2015   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 without macular edema (HCC) 04/24/2015   Severe obesity (BMI >= 40) (HCC) 02/19/2015   OSA (obstructive sleep apnea) 10/23/2014   Excessive daytime sleepiness 10/23/2014   Hypothyroidism 09/07/2014   Tobacco abuse 01/15/2014   Recurrent UTI 09/06/2013   Orthostatic hypotension 10/05/2012   General medical examination 06/22/2011   Atrial tachycardia 11/25/2010   Pre-operative cardiovascular examination 11/25/2010   Chronic diarrhea 08/25/2010   KNEE PAIN 07/14/2010   Knee pain 07/14/2010   DIASTOLIC HEART FAILURE, CHRONIC 06/09/2010   PERSONAL HISTORY OF COLONIC POLYPS 05/01/2010   Vitamin D deficiency 01/20/2010   RESTLESS LEG SYNDROME 01/20/2010   Insomnia 01/20/2010   Restless leg syndrome 01/20/2010   Hyperlipidemia associated with type 2 diabetes mellitus (HCC) 01/08/2009   Anemia 01/08/2009   GERD 01/08/2009   Benign hypertensive heart disease with heart failure (HCC) 01/08/2009    PCP: Neena Rhymes MD   REFERRING PROVIDER: Huel Cote, MD  REFERRING DIAG: [phone] (ICD-10-CM) - Status post reverse arthroplasty of right shoulder  THERAPY DIAG:  Acute pain of right shoulder  Muscle weakness (generalized)  Abnormal posture  Stiffness of right shoulder, not elsewhere classified  Rationale for Evaluation and Treatment: Rehabilitation  ONSET DATE: Date of Surgery: 07/12/22  SUBJECTIVE:  SUBJECTIVE STATEMENT: Arm is  still sore.    Hand dominance: Right  PERTINENT HISTORY: 72 year old female is 2 weeks status post right shoulder reverse shoulder arthroplasty overall doing very well. Because this is a reverse for fracture I will asked that she remain in the sling for an additional 2 weeks. She may come out of this for passive range of motion at this time 1 month postop. That time she will begin following the reverse for fracture protocol. Will plan on outpatient physical therapy.  PAIN:  Are you having pain? Yes: NPRS scale: 4/10 Pain location: R shoulder and around R collar bone    Pain description:  unclear  Aggravating factors: unclear   Relieving factors: unclear     PRECAUTIONS: Other: reverse total shoulder for fracture protocol   WEIGHT BEARING RESTRICTIONS: Yes NWB surgical LE   FALLS:  Has patient fallen in last 6 months? Yes. Number of falls 2- one fall on ramp in the rain that led to fracture/surgery, other fall was the night she came home from the hospital and just passed out; FOF (+)  LIVING ENVIRONMENT: Lives with: lives with their spouse Lives in: House/apartment Stairs: No Has following equipment at home: Single point cane, Environmental consultant - 2 wheeled, Crutches, Wheelchair (manual), bed side commode, and Ramped entry + walk-in shower, shower bench   OCCUPATION: Retired   PLOF: Independent, Independent with basic ADLs, Independent with gait, and Independent with transfers  PATIENT GOALS: move my arm, be able to wash dishes, brush teeth R handed- generally improve level of function    OBJECTIVE:   PATIENT SURVEYS:  09/10/2022 FOTO 57 (Goal met)  08/25/22 FOTO 49  EVAL: FOTO 32, predicted 57  COGNITION: Overall cognitive status: Within functional limits for tasks assessed and very anxious about therapy      SENSATION: Reports numbness and burning in upper arm since surgery   POSTURE: Rounded shoulders, forward head, increased thoracic kyphosis   UPPER EXTREMITY ROM:    Passive ROM Right eval Right 08/11/22 Right 08/13/2022 Right 08/17/22 Right 08/21/2022 Right 08/25/22 Right 08/26/2022 Right 09/02/22 Left/Right 09/10/2022 assessed supine   Shoulder flexion 45-50* P: 87 100 P: 110 Passive 110 AA: 136 110 PROM 120-130* supine, AAROM 120* supine   150/130   Shoulder scaption <20*       PROM 130-140*, 110* AAROM    Shoulder abduction  P: 78  P: 110  AA: 143 (Scaption)     Shoulder internal rotation   60  65  65  95/65  Shoulder external rotation -10* to -15* (edited 08/06/22, noted that I put ER value in IR column KEU) P: 20 30  35 AA: 35 40 PROM with arm at side 40-45*; AAROM 30-40*  60/50  Elbow flexion About 75% limited PROM, very guarded           Elbow extension WNL PROM but very guarded           (Blank rows = not tested)  UPPER EXTREMITY STRENGTH:  In pounds with hand-held dynamometer Right 09/10/2022 Left 09/10/2022  Shoulder flexion    Shoulder extension    Shoulder abduction    Shoulder adduction    Shoulder internal rotation 8.5 23.8  Shoulder external rotation < 3 pounds 18.5  (Blank rows = not tested)- DNT at eval due to limitations in protocol    TODAY'S TREATMENT:  DATE:  09/24/22 TherEx UBE L1 x 8 min; forward only trying to use LLE mainly for RUE motion; seat 8 Supine chest press into overhead flexion to tolerance AA with 1# bar 2x10 Supine chest press AA then active overhead and back to bar with RUE x10 reps Seated Rt active chest press (occasional assist from LUE) 2x10 Standing wall ladder 17-20 steps x 10 reps; flexion only; assist at Rt elbow to decrease arm dropping   09/22/22 TherEx UBE L1 x 8 min; forward only trying to use LLE mainly for RUE motion; seat 6 Standing wall ladder 15-19 steps x 10 reps; flexion only; assist at Rt elbow to decrease arm dropping Standing AA chest press with 1# bar 2x10  reps (limited range) Standing AA shoulder flexion with 1# bar 2x10 (PT assist with RUE) Supine chest press into overhead flexion to tolerance AA with 1# bar 2x10 Supine active shoulder flexion, min A for eccentric control x 10 reps Supine bicep stretch x 2 min   09/16/22 TherEx UBE L1 x 8 min; forward only trying to use LLE mainly for RUE motion; seat 6 Rows L1 band 2x10; 3 sec hold Standing wall ladder 15-19 steps x 10 reps; flexion only; assist at Rt elbow to decrease arm dropping Standing AA chest press with 1# bar 2x10 reps Standing AA shoulder flexion with 1# bar 2x10 Supine chest press into overhead flexion to tolerance AA with 1# bar 2x10 Supine shoulder isometrics  (extension, flexion, abdct, ER/IR) 10 x 5 sec hold each  09/14/22 TherEx UBE L1 x 8 min; forward only trying to use LLE mainly for RUE motion Seated scapular retraction 2x10; 5 sec hold Seated AA abduction with PT providing concentric assist x 10 reps; in elbow flexion Seated AA abduction with elbow flexion and 1# bar x 10 reps Standing wall ladder 12-16 steps x 10 reps; flexion only; assist at Rt elbow to decrease arm dropping Supine chest press 1# bar 2x10 Supine shoulder isometrics  (extension, flexion, abdct, ER/IR) 10 x 5 sec hold each Supine active flexion with elbow flexed; limited range but improved each rep   PATIENT EDUCATION: Education details: exam findings, POC, HEP; extensive education on typical recovery after shoulder surgery, also benefit of taking pain medicine prior to PT to make skilled interventions more tolerable  Person educated: Patient Education method: Explanation, Demonstration, and Handouts Education comprehension: verbalized understanding, returned demonstration, and needs further education  HOME EXERCISE PROGRAM: Access Code: [email or web address] URL: [email or web address] Date: 08/26/2022 Prepared by: Pauletta Browns  Exercises - Seated Shoulder Pendulum Exercise  - 3-5 x daily  - 7 x weekly - 1 sets - 20 reps - Gentle Horizontal Shoulder Pendulum  - 3-5 x daily - 7 x weekly - 1 sets - 20 reps - Gentle Vertical Shoulder Pendulum  - 3-5 x daily - 7 x weekly - 1 sets - 20 reps - Standing Scapular Retraction  - 5 x daily - 7 x weekly - 1 sets - 5 reps - 5 second hold - Supine Scapular Protraction in Flexion with Dumbbells  - 2-3 x daily - 7 x weekly - 1 sets - 10-20 reps - 3 seconds hold - Sidelying Shoulder External Rotation Dumbbell  - 1-2 x daily - 7 x weekly - 2 sets - 10 reps - 1 hold  ASSESSMENT:  CLINICAL IMPRESSION: Continued focus on ROM and muscle activation as much as possible. Will continue to benefit from PT to maximize function.  OBJECTIVE IMPAIRMENTS: decreased ROM, decreased strength, hypomobility,  increased edema, increased fascial restrictions, increased muscle spasms, impaired flexibility, impaired UE functional use, postural dysfunction, obesity, and pain.   ACTIVITY LIMITATIONS: carrying, lifting, bed mobility, bathing, toileting, dressing, reach over head, and caring for others  PARTICIPATION LIMITATIONS: cleaning, laundry, driving, shopping, community activity, and yard work  PERSONAL FACTORS: Age, Behavior pattern, Education, Fitness, Past/current experiences, Sex, Social background, and Time since onset of injury/illness/exacerbation are also affecting patient's functional outcome.   REHAB POTENTIAL: Good  CLINICAL DECISION MAKING: Stable/uncomplicated  EVALUATION COMPLEXITY: Low   GOALS: Goals reviewed with patient? Yes  SHORT TERM GOALS: Target date: 09/02/2022   Will be compliant with appropriate progressive HEP  Baseline: Goal status: Met 08/21/2022  2.  R shoulder flexion and scaption PROM to be at least 100 degrees, R shoulder ER PROM to be at least 20 degrees at 0* ABD  Baseline:  Goal status: Met 08/13/2022  3.  R shoulder flexion and scaption AAROM to be at least 90 degrees, R shoulder ER AAROM to reach neutral  Baseline:   Goal status: MET 08/25/2022   4.  Will demonstrate better functional posture/postural mechanics  Baseline:  Goal status: On Going 09/10/2022   LONG TERM GOALS: Target date: 10/09/2022    R shoulder flexion and scaption AROM to be at least 120 degrees  Baseline:  Goal status: On Going 09/10/2022  2.  R shoulder ER AROM to be at least 30 degrees at 45* ABD  Baseline:  Goal status: Met 09/10/2022  3.  Will be able to reach L5 level with active FIR no increased pain  Baseline:  Goal status: Ongoing 09/10/22  4.  Will be able to perform all dressing/bathing tasks on a Mod(I) basis no increase in pain  Baseline:  Goal status: Ongoing 09/10/22  5.  Will be able to reach overhead to put dishes in the cabinet without increase in pain  Baseline:  Goal status: Ongoing 09/10/22   PLAN:  PT FREQUENCY: 2 x per week  PT DURATION: 4 weeks  PLANNED INTERVENTIONS: Therapeutic exercises, Therapeutic activity, Neuromuscular re-education, Balance training, Gait training, Patient/Family education, Self Care, Joint mobilization, Aquatic Therapy, Dry Needling, Electrical stimulation, Cryotherapy, Moist heat, Taping, Ultrasound, Ionotophoresis 4mg /ml Dexamethasone, Manual therapy, and Re-evaluation  PLAN FOR NEXT SESSION:  monitor bicep,  continue ROM exercises as tolerated;  okay to start AROM as tolerated Scapular strength/functional work (without external resistance).  Progress per Dr. Serena Croissant protocol (check MD notes to see if able to move into next phase)  NEXT MD VISIT: 11/18/22  Clarita Crane, PT, DPT 09/24/22 9:27 AM

## 2022-09-25 ENCOUNTER — Other Ambulatory Visit: Payer: Self-pay | Admitting: Family Medicine

## 2022-09-29 ENCOUNTER — Ambulatory Visit: Payer: Medicare HMO | Admitting: Rehabilitative and Restorative Service Providers"

## 2022-09-29 ENCOUNTER — Encounter: Payer: Self-pay | Admitting: Rehabilitative and Restorative Service Providers"

## 2022-09-29 DIAGNOSIS — M6281 Muscle weakness (generalized): Secondary | ICD-10-CM

## 2022-09-29 DIAGNOSIS — M25611 Stiffness of right shoulder, not elsewhere classified: Secondary | ICD-10-CM

## 2022-09-29 DIAGNOSIS — R293 Abnormal posture: Secondary | ICD-10-CM

## 2022-09-29 DIAGNOSIS — M25511 Pain in right shoulder: Secondary | ICD-10-CM | POA: Diagnosis not present

## 2022-09-29 NOTE — Therapy (Addendum)
OUTPATIENT PHYSICAL THERAPY SHOULDER TREATMENT/PROGRESS NOTE  Patient Name: Lacey [name] MRN: [id number] DOB:1950/10/01, 72 y.o., female Today's Date: 09/29/2022  Progress Note Reporting Period 08/05/2022 to 09/29/2022  See note below for Objective Data and Assessment of Progress/Goals.      END OF SESSION:  PT End of Session - 09/29/22 0842     Visit Number 23    Number of Visits 31    Date for PT Re-Evaluation 10/30/22    Authorization Type [employer] MCR    Authorization - Visit Number 23    Progress Note Due on Visit 29    PT Start Time 0841    PT Stop Time 0928    PT Time Calculation (min) 47 min    Activity Tolerance Patient tolerated treatment well;No increased pain    Behavior During Therapy WFL for tasks assessed/performed              Past Medical History:  Diagnosis Date   Adenomatous colon polyp    hyperplastic   Anemia    Anginal pain (HCC)    not recent   Arthritis    NECK, KNEES, FINGERS, TOES   Atrial tachycardia CARDIOLOGIST - DR Graciela Husbands (LAST VISIT AUG 2012)   Echo 12/11: EF 55-60%, Mild LVH, grade 1 diast dysfxn;   holter 1/12: ATach   Atypical chest pain    a. 07/2004 Cath: Clean cors;  b. 04/2010 Myoview: EF 63%, no ischemia   Blood transfusion without reported diagnosis 1969   CHF (congestive heart failure) (HCC)    Chronic kidney disease    left kidney small    Coronary artery disease    Diabetes mellitus, type 2 (HCC)    ORAL AND INSULIN MEDS (LAST A1C  7.3)   Diverticulosis    Erythema CURRENT-- CLOSED ABD. WALL ABSCESS   PER PCP NOTE (03-31-11)- MRSA-- TAKES DOXYCYCLINE   GERD (gastroesophageal reflux disease)    CONTROLLED W/ PREVACID   History of kidney stones 2012   Hyperlipidemia    Hypertension    Insomnia    TAKES MEDS PRN   Neuropathy, peripheral    Obesity (BMI 30-39.9) 02/19/2015   Pneumonia    as child   Post op infection 11/07/2012   left bunionectomy   Restless leg syndrome    Sepsis, urinary HISTORY - 2004   Sleep  apnea    no cpap    Thyroid disease    TIA (transient ischemic attack)    "mini strokes" 2023   Tremor    Vitamin D deficiency    Past Surgical History:  Procedure Laterality Date   ABDOMINAL HYSTERECTOMY  1995   ovaries remain   BUNIONECTOMY Left 10/24/2012   BUNIONECTOMY Right 05/17/2017   CARDIAC CATHETERIZATION  07/10/2004   CARPAL TUNNEL RELEASE  2000   RIGHT   CATARACT EXTRACTION, BILATERAL     CERVICAL FUSION  10/12/2007   C5 - 7   COLONOSCOPY     CYSTOSCOPY WITH RETROGRADE PYELOGRAM, URETEROSCOPY AND STENT PLACEMENT Left 11/28/2012   Procedure: LEFT RETROGRADE PYELOGRAM, LEFT URETEROSCOPY, ;  Surgeon: Garnett Farm, MD;  Location: [facility];  Service: Urology;  Laterality: Left;   CYSTOSCOPY/RETROGRADE/URETEROSCOPY/STONE EXTRACTION WITH BASKET  X2 2004 & 2009   LEFT    GASTRIC BYPASS  1981   KNEE ARTHROSCOPY  12/2010   LEFT   LAPAROSCOPIC CHOLECYSTECTOMY  2001   LEFT HEART CATH AND CORONARY ANGIOGRAPHY N/A 12/03/2021   Procedure: LEFT HEART CATH AND CORONARY ANGIOGRAPHY;  Surgeon: Lance Muss  S, MD;  Location: [facility];  Service: Cardiovascular;  Laterality: N/A;   left thumb joint surgery  2013   PERCUTANEOUS NEPHROSTOLITHOTOMY  02/27/2011   LEFT   POLYPECTOMY     REVERSE SHOULDER ARTHROPLASTY Right 07/12/2022   Procedure: REVERSE SHOULDER ARTHROPLASTY;  Surgeon: Huel Cote, MD;  Location: [facility];  Service: Orthopedics;  Laterality: Right;   RIGHT THUMB JOINT SURG.  09/2009   TRIGGER FINGER RELEASE  2010   RIGHT THUMB   UPPER GASTROINTESTINAL ENDOSCOPY     URETERAL STENT PLACEMENT  X2  2004  &  2009   LEFT   URETEROSCOPY  04/06/2011   Procedure: URETEROSCOPY;  Surgeon: Garnett Farm, MD;  Location: [facility];  Service: Urology;  Laterality: Left;  L Ureteroscopy Laser Litho & Stent    Patient Active Problem List   Diagnosis Date Noted   Anxiety 08/19/2022   Closed 3-part fracture of proximal end of right humerus 07/11/2022    Shoulder dislocation 07/11/2022   Abnormal stress test    Atypical chest pain 08/05/2021   Bradycardia 06/20/2021   Abnormal gait 03/17/2021   Acquired deformity of lower leg 03/17/2021   Urinary urgency 11/14/2020   Paresthesia 08/15/2020   Right lumbar pain 08/15/2020   Tremor 08/15/2020   Hypoglycemia associated with diabetes (HCC) 02/29/2020   Essential (primary) hypertension 08/10/2019   Chronic neck pain 06/23/2016   Radicular low back pain 07/12/2015   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 without macular edema (HCC) 04/24/2015   Severe obesity (BMI >= 40) (HCC) 02/19/2015   OSA (obstructive sleep apnea) 10/23/2014   Excessive daytime sleepiness 10/23/2014   Hypothyroidism 09/07/2014   Tobacco abuse 01/15/2014   Recurrent UTI 09/06/2013   Orthostatic hypotension 10/05/2012   General medical examination 06/22/2011   Atrial tachycardia 11/25/2010   Pre-operative cardiovascular examination 11/25/2010   Chronic diarrhea 08/25/2010   KNEE PAIN 07/14/2010   Knee pain 07/14/2010   DIASTOLIC HEART FAILURE, CHRONIC 06/09/2010   PERSONAL HISTORY OF COLONIC POLYPS 05/01/2010   Vitamin D deficiency 01/20/2010   RESTLESS LEG SYNDROME 01/20/2010   Insomnia 01/20/2010   Restless leg syndrome 01/20/2010   Hyperlipidemia associated with type 2 diabetes mellitus (HCC) 01/08/2009   Anemia 01/08/2009   GERD 01/08/2009   Benign hypertensive heart disease with heart failure (HCC) 01/08/2009    PCP: Neena Rhymes MD   REFERRING PROVIDER: Huel Cote, MD  REFERRING DIAG: [phone] (ICD-10-CM) - Status post reverse arthroplasty of right shoulder  THERAPY DIAG:  Acute pain of right shoulder  Muscle weakness (generalized)  Abnormal posture  Stiffness of right shoulder, not elsewhere classified  Rationale for Evaluation and Treatment: Rehabilitation  ONSET DATE: Date of Surgery: 07/12/22  SUBJECTIVE:  SUBJECTIVE STATEMENT: Davy notes she was able to do more in the kitchen over the weekend.  She reports "very good" HEP compliance over the weekend.  She is comfortable with doing things at elbow height, but has a difficult time with reaching and overhead function.  Hand dominance: Right  PERTINENT HISTORY: 72 year old female is 2 weeks status post right shoulder reverse shoulder arthroplasty overall doing very well. Because this is a reverse for fracture I will asked that she remain in the sling for an additional 2 weeks. She may come out of this for passive range of motion at this time 1 month postop. That time she will begin following the reverse for fracture protocol. Will plan on outpatient physical therapy.  PAIN:  Are you having pain? Yes: NPRS scale: 0-3/10 this week Pain location: R shoulder and around R collar bone    Pain description:  unclear  Aggravating factors: unclear   Relieving factors: unclear     PRECAUTIONS: Other: reverse total shoulder for fracture protocol   WEIGHT BEARING RESTRICTIONS: Yes NWB surgical LE   FALLS:  Has patient fallen in last 6 months? Yes. Number of falls 2- one fall on ramp in the rain that led to fracture/surgery, other fall was the night she came home from the hospital and just passed out; FOF (+)  LIVING ENVIRONMENT: Lives with: lives with their spouse Lives in: House/apartment Stairs: No Has following equipment at home: Single point cane, Environmental consultant - 2 wheeled, Crutches, Wheelchair (manual), bed side commode, and Ramped entry + walk-in shower, shower bench   OCCUPATION: Retired   PLOF: Independent, Independent with basic ADLs, Independent with gait, and Independent with transfers  PATIENT GOALS: move my arm, be able to wash dishes, brush teeth R handed- generally improve level of  function    OBJECTIVE:   PATIENT SURVEYS:  09/10/2022 FOTO 57 (Goal met)  08/25/22 FOTO 49  EVAL: FOTO 32, predicted 57  COGNITION: Overall cognitive status: Within functional limits for tasks assessed and very anxious about therapy      SENSATION: Reports numbness and burning in upper arm since surgery   POSTURE: Rounded shoulders, forward head, increased thoracic kyphosis   UPPER EXTREMITY ROM:   Passive ROM Right eval Right 08/11/22 Right 08/13/2022 Right 08/17/22 Right 08/21/2022 Right 08/25/22 Right 08/26/2022 Right 09/02/22 Left/Right 09/10/2022 assessed supine  Right 09/29/2022 assessed supine  Shoulder flexion 45-50* P: 87 100 P: 110 Passive 110 AA: 136 110 PROM 120-130* supine, AAROM 120* supine   150/130  135  Shoulder scaption <20*       PROM 130-140*, 110* AAROM     Shoulder abduction  P: 78  P: 110  AA: 143 (Scaption)      Shoulder internal rotation   60  65  65  95/65 70  Shoulder external rotation -10* to -15* (edited 08/06/22, noted that I put ER value in IR column KEU) P: 20 30  35 AA: 35 40 PROM with arm at side 40-45*; AAROM 30-40*  60/50 55  Elbow flexion About 75% limited PROM, very guarded            Elbow extension WNL PROM but very guarded            (Blank rows = not tested)  UPPER EXTREMITY STRENGTH:  In pounds with hand-held dynamometer Right 09/10/2022 Left 09/10/2022 Right 09/29/2022  Shoulder flexion     Shoulder extension     Shoulder abduction     Shoulder adduction  Shoulder internal rotation 8.5 23.8 9.2  Shoulder external rotation < 3 pounds 18.5 4.2  (Blank rows = not tested)- DNT at eval due to limitations in protocol    TODAY'S TREATMENT:                                                                                                                                         DATE: 09/29/2022 Shoulder blade pinches 2 sets of 10 for 5 seconds Supine arm raises (for scapular protraction) 3 sets of 10 (left hand guides but does not help) Side-lie ER  3 sets of 10 with 1#, slow eccentrics Supine ER stretch with PT assist 10X 10 seconds Supine AAROM shoulder flexion, palm facing in, reach at top, left side assist 2 sets of 10 for 10 seconds   09/24/22 TherEx UBE L1 x 8 min; forward only trying to use LLE mainly for RUE motion; seat 8 Supine chest press into overhead flexion to tolerance AA with 1# bar 2x10 Supine chest press AA then active overhead and back to bar with RUE x10 reps Seated Rt active chest press (occasional assist from LUE) 2x10 Standing wall ladder 17-20 steps x 10 reps; flexion only; assist at Rt elbow to decrease arm dropping   09/22/22 TherEx UBE L1 x 8 min; forward only trying to use LLE mainly for RUE motion; seat 6 Standing wall ladder 15-19 steps x 10 reps; flexion only; assist at Rt elbow to decrease arm dropping Standing AA chest press with 1# bar 2x10 reps (limited range) Standing AA shoulder flexion with 1# bar 2x10 (PT assist with RUE) Supine chest press into overhead flexion to tolerance AA with 1# bar 2x10 Supine active shoulder flexion, min A for eccentric control x 10 reps Supine bicep stretch x 2 min   PATIENT EDUCATION: Education details: exam findings, POC, HEP; extensive education on typical recovery after shoulder surgery, also benefit of taking pain medicine prior to PT to make skilled interventions more tolerable  Person educated: Patient Education method: Explanation, Demonstration, and Handouts Education comprehension: verbalized understanding, returned demonstration, and needs further education  HOME EXERCISE PROGRAM: Access Code: [email or web address] URL: [email or web address] Date: 08/26/2022 Prepared by: Pauletta Browns  Exercises - Seated Shoulder Pendulum Exercise  - 3-5 x daily - 7 x weekly - 1 sets - 20 reps - Gentle Horizontal Shoulder Pendulum  - 3-5 x daily - 7 x weekly - 1 sets - 20 reps - Gentle Vertical Shoulder Pendulum  - 3-5 x daily - 7 x weekly - 1 sets - 20 reps -  Standing Scapular Retraction  - 5 x daily - 7 x weekly - 1 sets - 5 reps - 5 second hold - Supine Scapular Protraction in Flexion with Dumbbells  - 2-3 x daily - 7 x weekly - 1 sets - 10-20 reps - 3 seconds hold - Sidelying Shoulder External Rotation Dumbbell  - 1-2 x  daily - 7 x weekly - 2 sets - 10 reps - 1 hold  ASSESSMENT:  CLINICAL IMPRESSION: Abigaille is doing a great job with her HEP.  Strength and AROM (assessed supine) are better than the last time we checked.  Lifting her right arm against gravity is still a challenge, but Alyasia is putting in the work necessary to reach this goal.  Her current program is addressing her remaining impairments and she will benefit from continued supervised strength work to improve reaching, overhead function and independence with her home program before transfer into independent rehabilitation.  OBJECTIVE IMPAIRMENTS: decreased ROM, decreased strength, hypomobility, increased edema, increased fascial restrictions, increased muscle spasms, impaired flexibility, impaired UE functional use, postural dysfunction, obesity, and pain.   ACTIVITY LIMITATIONS: carrying, lifting, bed mobility, bathing, toileting, dressing, reach over head, and caring for others  PARTICIPATION LIMITATIONS: cleaning, laundry, driving, shopping, community activity, and yard work  PERSONAL FACTORS: Age, Behavior pattern, Education, Fitness, Past/current experiences, Sex, Social background, and Time since onset of injury/illness/exacerbation are also affecting patient's functional outcome.   REHAB POTENTIAL: Good  CLINICAL DECISION MAKING: Stable/uncomplicated  EVALUATION COMPLEXITY: Low   GOALS: Goals reviewed with patient? Yes  SHORT TERM GOALS: Target date: 09/02/2022   Will be compliant with appropriate progressive HEP  Baseline: Goal status: Met 08/21/2022  2.  R shoulder flexion and scaption PROM to be at least 100 degrees, R shoulder ER PROM to be at least 20 degrees at 0*  ABD  Baseline:  Goal status: Met 08/13/2022  3.  R shoulder flexion and scaption AAROM to be at least 90 degrees, R shoulder ER AAROM to reach neutral  Baseline:  Goal status: MET 08/25/2022   4.  Will demonstrate better functional posture/postural mechanics  Baseline:  Goal status: Met 09/29/2022   LONG TERM GOALS: Target date: 10/30/2022    R shoulder flexion and scaption AROM to be at least 120 degrees  Baseline:  Goal status: Partially Met 09/29/2022 (Supine met, sitting/standing no)  2.  R shoulder ER AROM to be at least 30 degrees at 45* ABD  Baseline:  Goal status: Met 09/10/2022  3.  Will be able to reach L5 level with active FIR no increased pain  Baseline:  Goal status: On Going 09/29/22  4.  Will be able to perform all dressing/bathing tasks on a Mod(I) basis no increase in pain  Baseline:  Goal status: Met 09/25/2022  5.  Will be able to reach overhead to put dishes in the cabinet without increase in pain  Baseline:  Goal status: On Going 09/29/22   PLAN:  PT FREQUENCY: 2 x per week  PT DURATION: 4 weeks  PLANNED INTERVENTIONS: Therapeutic exercises, Therapeutic activity, Neuromuscular re-education, Balance training, Gait training, Patient/Family education, Self Care, Joint mobilization, Aquatic Therapy, Dry Needling, Electrical stimulation, Cryotherapy, Moist heat, Taping, Ultrasound, Ionotophoresis 4mg /ml Dexamethasone, Manual therapy, and Re-evaluation  PLAN FOR NEXT SESSION:  Continue AROM, scapular strength/functional work (with appropriate external resistance).  Progress per Dr. Serena Croissant protocol (check MD notes to see if able to move into next phase)  NEXT MD VISIT: 11/18/22  Cherlyn Cushing PT, MPT 09/29/22 3:04 PM

## 2022-10-01 ENCOUNTER — Encounter: Payer: Self-pay | Admitting: Family Medicine

## 2022-10-01 ENCOUNTER — Ambulatory Visit (INDEPENDENT_AMBULATORY_CARE_PROVIDER_SITE_OTHER): Payer: Medicare HMO | Admitting: Family Medicine

## 2022-10-01 VITALS — BP 128/78 | HR 53 | Temp 98.1°F | Resp 17 | Ht 62.0 in | Wt 211.4 lb

## 2022-10-01 DIAGNOSIS — E1169 Type 2 diabetes mellitus with other specified complication: Secondary | ICD-10-CM

## 2022-10-01 DIAGNOSIS — E113391 Type 2 diabetes mellitus with moderate nonproliferative diabetic retinopathy without macular edema, right eye: Secondary | ICD-10-CM | POA: Diagnosis not present

## 2022-10-01 DIAGNOSIS — F419 Anxiety disorder, unspecified: Secondary | ICD-10-CM | POA: Diagnosis not present

## 2022-10-01 DIAGNOSIS — Z794 Long term (current) use of insulin: Secondary | ICD-10-CM | POA: Diagnosis not present

## 2022-10-01 DIAGNOSIS — E785 Hyperlipidemia, unspecified: Secondary | ICD-10-CM

## 2022-10-01 LAB — CBC WITH DIFFERENTIAL/PLATELET
Basophils Absolute: 0 10*3/uL (ref 0.0–0.1)
Basophils Relative: 0.5 % (ref 0.0–3.0)
Eosinophils Absolute: 0.1 10*3/uL (ref 0.0–0.7)
Eosinophils Relative: 1.7 % (ref 0.0–5.0)
HCT: 41.6 % (ref 36.0–46.0)
Hemoglobin: 13.4 g/dL (ref 12.0–15.0)
Lymphocytes Relative: 20.1 % (ref 12.0–46.0)
Lymphs Abs: 1.6 10*3/uL (ref 0.7–4.0)
MCHC: 32.3 g/dL (ref 30.0–36.0)
MCV: 93.8 fl (ref 78.0–100.0)
Monocytes Absolute: 0.5 10*3/uL (ref 0.1–1.0)
Monocytes Relative: 5.9 % (ref 3.0–12.0)
Neutro Abs: 5.8 10*3/uL (ref 1.4–7.7)
Neutrophils Relative %: 71.8 % (ref 43.0–77.0)
Platelets: 152 10*3/uL (ref 150.0–400.0)
RBC: 4.43 Mil/uL (ref 3.87–5.11)
RDW: 14.5 % (ref 11.5–15.5)
WBC: 8.1 10*3/uL (ref 4.0–10.5)

## 2022-10-01 LAB — LIPID PANEL
Cholesterol: 153 mg/dL (ref 0–200)
HDL: 43.5 mg/dL (ref >39.00)
LDL Cholesterol: 84 mg/dL (ref 0–99)
NonHDL: 109.22
Total CHOL/HDL Ratio: 4
Triglycerides: 124 mg/dL (ref 0.0–149.0)
VLDL: 24.8 mg/dL (ref 0.0–40.0)

## 2022-10-01 LAB — BASIC METABOLIC PANEL
BUN: 11 mg/dL (ref 6–23)
CO2: 26 mEq/L (ref 19–32)
Calcium: 8.8 mg/dL (ref 8.4–10.5)
Chloride: 102 mEq/L (ref 96–112)
Creatinine, Ser: 0.76 mg/dL (ref 0.40–1.20)
GFR: 78.3 mL/min (ref >60.00)
Glucose, Bld: 228 mg/dL — ABNORMAL HIGH (ref 70–99)
Potassium: 3.6 mEq/L (ref 3.5–5.1)
Sodium: 137 mEq/L (ref 135–145)

## 2022-10-01 LAB — HEPATIC FUNCTION PANEL
ALT: 8 U/L (ref 0–35)
AST: 13 U/L (ref 0–37)
Albumin: 3.9 g/dL (ref 3.5–5.2)
Alkaline Phosphatase: 57 U/L (ref 39–117)
Bilirubin, Direct: 0.1 mg/dL (ref 0.0–0.3)
Total Bilirubin: 0.3 mg/dL (ref 0.2–1.2)
Total Protein: 6.7 g/dL (ref 6.0–8.3)

## 2022-10-01 LAB — MICROALBUMIN / CREATININE URINE RATIO
Creatinine,U: 106.8 mg/dL
Microalb Creat Ratio: 169.2 mg/g — ABNORMAL HIGH (ref 0.0–30.0)
Microalb, Ur: 180.7 mg/dL — ABNORMAL HIGH (ref 0.0–1.9)

## 2022-10-01 LAB — TSH: TSH: 2.08 u[IU]/mL (ref 0.35–5.50)

## 2022-10-01 LAB — HEMOGLOBIN A1C: Hgb A1c MFr Bld: 7 % — ABNORMAL HIGH (ref 4.6–6.5)

## 2022-10-01 NOTE — Assessment & Plan Note (Signed)
Improved w/ increased dose of Sertraline.  Continue 50mg  daily.  Will follow.

## 2022-10-01 NOTE — Assessment & Plan Note (Signed)
Chronic problem.  On Lantus 60 units and Novolog 25 units TID.  Check microalbumin.  UTD on eye exam, foot exam.  Currently asymptomatic.  Check labs.  Adjust meds prn

## 2022-10-01 NOTE — Patient Instructions (Signed)
Schedule your complete physical in 4 months We'll notify you of your lab results and make any changes if needed No med changes at this time Try and cut back on the smoking!! Continue to work on a low carb/low sugar diet Call with any questions or concerns Hang in there!!!

## 2022-10-01 NOTE — Progress Notes (Signed)
   Subjective:    Patient ID: Lacey [name], female    DOB: Oct 10, 1950, 72 y.o.   MRN: [id number]  HPI Anxiety- at last visit Sertraline was increased to 50mg  daily.  Pt reports that overall anxiety has improved.  Still has some frustration and down days regarding inability to raise R shoulder.    DM- chronic problem, on Lantus 60 units nightly, Novolog 25units TID.  Due for microalbumin.  UTD on eye exam, foot exam.  No L sided CP, SOB, HA's, visual changes.  Hyperlipidemia- chronic problem, on Atorvastatin 20mg  daily, Fenofibrate 160mg  daily.  Denies abd pain, N/V.   Review of Systems For ROS see HPI     Objective:   Physical Exam Vitals reviewed.  Constitutional:      General: She is not in acute distress.    Appearance: Normal appearance. She is well-developed. She is obese. She is not ill-appearing.  HENT:     Head: Normocephalic and atraumatic.  Eyes:     Conjunctiva/sclera: Conjunctivae normal.     Pupils: Pupils are equal, round, and reactive to light.  Neck:     Thyroid: No thyromegaly.  Cardiovascular:     Rate and Rhythm: Normal rate and regular rhythm.     Pulses: Normal pulses.     Heart sounds: Normal heart sounds. No murmur heard. Pulmonary:     Effort: Pulmonary effort is normal. No respiratory distress.     Breath sounds: Normal breath sounds.  Abdominal:     General: There is no distension.     Palpations: Abdomen is soft.     Tenderness: There is no abdominal tenderness.  Musculoskeletal:     Cervical back: Normal range of motion and neck supple.     Right lower leg: No edema.     Left lower leg: No edema.  Lymphadenopathy:     Cervical: No cervical adenopathy.  Skin:    General: Skin is warm and dry.  Neurological:     General: No focal deficit present.     Mental Status: She is alert and oriented to person, place, and time.  Psychiatric:        Behavior: Behavior normal.           Assessment & Plan:

## 2022-10-01 NOTE — Assessment & Plan Note (Signed)
Chronic problem, on Atorvastatin 20mg  daily and Fenofibrate 160mg  daily.  Currently asymptomatic.  Check labs.  Adjust meds prn

## 2022-10-02 ENCOUNTER — Telehealth: Payer: Self-pay

## 2022-10-02 ENCOUNTER — Encounter: Payer: Self-pay | Admitting: Physical Therapy

## 2022-10-02 ENCOUNTER — Ambulatory Visit (INDEPENDENT_AMBULATORY_CARE_PROVIDER_SITE_OTHER): Payer: Medicare HMO | Admitting: Physical Therapy

## 2022-10-02 ENCOUNTER — Other Ambulatory Visit: Payer: Self-pay

## 2022-10-02 DIAGNOSIS — M25511 Pain in right shoulder: Secondary | ICD-10-CM | POA: Diagnosis not present

## 2022-10-02 DIAGNOSIS — Z794 Long term (current) use of insulin: Secondary | ICD-10-CM

## 2022-10-02 DIAGNOSIS — M25611 Stiffness of right shoulder, not elsewhere classified: Secondary | ICD-10-CM | POA: Diagnosis not present

## 2022-10-02 DIAGNOSIS — R293 Abnormal posture: Secondary | ICD-10-CM

## 2022-10-02 DIAGNOSIS — M6281 Muscle weakness (generalized): Secondary | ICD-10-CM

## 2022-10-02 MED ORDER — ONETOUCH ULTRASOFT 2 LANCETS MISC: 1.0000 | Freq: Four times a day (QID) | 3 refills | Status: AC

## 2022-10-02 MED ORDER — GLUCOSE BLOOD VI STRP: ORAL_STRIP | 12 refills | Status: DC

## 2022-10-02 NOTE — Telephone Encounter (Signed)
Pt is aware of lab results.

## 2022-10-02 NOTE — Telephone Encounter (Signed)
Orders have been placed and sent to [facility] . I made the pt aware

## 2022-10-02 NOTE — Telephone Encounter (Signed)
-----   Message from Sheliah Hatch, MD sent at 10/02/2022  7:25 AM EDT ----- A1C is stable at 7%.  No med changes at this time but please continue to watch low carb/low sugar diet as sugar was elevated at 228.  Amount of protein in your urine has increased but you are already taking high dose Valsartan which will protect your kidneys  Remainder of labs look good

## 2022-10-02 NOTE — Therapy (Signed)
OUTPATIENT PHYSICAL THERAPY SHOULDER TREATMENT  Patient Name: Lacey [name] MRN: [id number] DOB:[birth year], 72 y.o., female Today's Date: 10/02/2022      END OF SESSION:  PT End of Session - 10/02/22 0844     Visit Number 24    Number of Visits 31    Date for PT Re-Evaluation 10/30/22    Authorization Type [employer] MCR    Progress Note Due on Visit 29    PT Start Time 0840    PT Stop Time 0920    PT Time Calculation (min) 40 min    Activity Tolerance Patient tolerated treatment well;No increased pain    Behavior During Therapy WFL for tasks assessed/performed               Past Medical History:  Diagnosis Date   Adenomatous colon polyp    hyperplastic   Anemia    Anginal pain (HCC)    not recent   Arthritis    NECK, KNEES, FINGERS, TOES   Atrial tachycardia CARDIOLOGIST - DR Graciela Husbands (LAST VISIT AUG 2012)   Echo 12/11: EF 55-60%, Mild LVH, grade 1 diast dysfxn;   holter 1/12: ATach   Atypical chest pain    a. 07/2004 Cath: Clean cors;  b. 04/2010 Myoview: EF 63%, no ischemia   Blood transfusion without reported diagnosis 1969   CHF (congestive heart failure) (HCC)    Chronic kidney disease    left kidney small    Coronary artery disease    Diabetes mellitus, type 2 (HCC)    ORAL AND INSULIN MEDS (LAST A1C  7.3)   Diverticulosis    Erythema CURRENT-- CLOSED ABD. WALL ABSCESS   PER PCP NOTE (03-31-11)- MRSA-- TAKES DOXYCYCLINE   GERD (gastroesophageal reflux disease)    CONTROLLED W/ PREVACID   History of kidney stones 2012   Hyperlipidemia    Hypertension    Insomnia    TAKES MEDS PRN   Neuropathy, peripheral    Obesity (BMI 30-39.9) 02/19/2015   Pneumonia    as child   Post op infection 11/07/2012   left bunionectomy   Restless leg syndrome    Sepsis, urinary HISTORY - 2004   Sleep apnea    no cpap    Thyroid disease    TIA (transient ischemic attack)    "mini strokes" 2023   Tremor    Vitamin D deficiency    Past Surgical History:  Procedure  Laterality Date   ABDOMINAL HYSTERECTOMY  1995   ovaries remain   BUNIONECTOMY Left 10/24/2012   BUNIONECTOMY Right 05/17/2017   CARDIAC CATHETERIZATION  07/10/2004   CARPAL TUNNEL RELEASE  2000   RIGHT   CATARACT EXTRACTION, BILATERAL     CERVICAL FUSION  10/12/2007   C5 - 7   COLONOSCOPY     CYSTOSCOPY WITH RETROGRADE PYELOGRAM, URETEROSCOPY AND STENT PLACEMENT Left 11/28/2012   Procedure: LEFT RETROGRADE PYELOGRAM, LEFT URETEROSCOPY, ;  Surgeon: Garnett Farm, MD;  Location: [facility];  Service: Urology;  Laterality: Left;   CYSTOSCOPY/RETROGRADE/URETEROSCOPY/STONE EXTRACTION WITH BASKET  X2 2004 & 2009   LEFT    GASTRIC BYPASS  1981   KNEE ARTHROSCOPY  12/2010   LEFT   LAPAROSCOPIC CHOLECYSTECTOMY  2001   LEFT HEART CATH AND CORONARY ANGIOGRAPHY N/A 12/03/2021   Procedure: LEFT HEART CATH AND CORONARY ANGIOGRAPHY;  Surgeon: Corky Crafts, MD;  Location: [facility] INVASIVE CV LAB;  Service: Cardiovascular;  Laterality: N/A;   left thumb joint surgery  2013   PERCUTANEOUS NEPHROSTOLITHOTOMY  02/27/2011   LEFT   POLYPECTOMY     REVERSE SHOULDER ARTHROPLASTY Right 07/12/2022   Procedure: REVERSE SHOULDER ARTHROPLASTY;  Surgeon: Huel Cote, MD;  Location: [facility];  Service: Orthopedics;  Laterality: Right;   RIGHT THUMB JOINT SURG.  09/2009   TRIGGER FINGER RELEASE  2010   RIGHT THUMB   UPPER GASTROINTESTINAL ENDOSCOPY     URETERAL STENT PLACEMENT  X2  2004  &  2009   LEFT   URETEROSCOPY  04/06/2011   Procedure: URETEROSCOPY;  Surgeon: Garnett Farm, MD;  Location: [facility];  Service: Urology;  Laterality: Left;  L Ureteroscopy Laser Litho & Stent    Patient Active Problem List   Diagnosis Date Noted   Anxiety 08/19/2022   Closed 3-part fracture of proximal end of right humerus 07/11/2022   Shoulder dislocation 07/11/2022   Abnormal stress test    Atypical chest pain 08/05/2021   Bradycardia 06/20/2021   Abnormal gait 03/17/2021   Acquired deformity of  lower leg 03/17/2021   Urinary urgency 11/14/2020   Paresthesia 08/15/2020   Right lumbar pain 08/15/2020   Tremor 08/15/2020   Hypoglycemia associated with diabetes (HCC) 02/29/2020   Essential (primary) hypertension 08/10/2019   Chronic neck pain 06/23/2016   Radicular low back pain 07/12/2015   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 without macular edema (HCC) 04/24/2015   Severe obesity (BMI >= 40) (HCC) 02/19/2015   OSA (obstructive sleep apnea) 10/23/2014   Excessive daytime sleepiness 10/23/2014   Hypothyroidism 09/07/2014   Tobacco abuse 01/15/2014   Recurrent UTI 09/06/2013   Orthostatic hypotension 10/05/2012   General medical examination 06/22/2011   Atrial tachycardia 11/25/2010   Chronic diarrhea 08/25/2010   KNEE PAIN 07/14/2010   Knee pain 07/14/2010   DIASTOLIC HEART FAILURE, CHRONIC 06/09/2010   PERSONAL HISTORY OF COLONIC POLYPS 05/01/2010   Vitamin D deficiency 01/20/2010   RESTLESS LEG SYNDROME 01/20/2010   Insomnia 01/20/2010   Restless leg syndrome 01/20/2010   Hyperlipidemia associated with type 2 diabetes mellitus (HCC) 01/08/2009   Anemia 01/08/2009   GERD 01/08/2009   Benign hypertensive heart disease with heart failure (HCC) 01/08/2009    PCP: Neena Rhymes MD   REFERRING PROVIDER: Huel Cote, MD  REFERRING DIAG: [phone] (ICD-10-CM) - Status post reverse arthroplasty of right shoulder  THERAPY DIAG:  Acute pain of right shoulder  Muscle weakness (generalized)  Abnormal posture  Stiffness of right shoulder, not elsewhere classified  Rationale for Evaluation and Treatment: Rehabilitation  ONSET DATE: Date of Surgery: 07/12/22  SUBJECTIVE:  SUBJECTIVE STATEMENT: "I have really good days, and some more  rough days."  Reports she ate dinner with her Rt hand last night for the first time.  Hand dominance: Right  PERTINENT HISTORY: 72 year old female is 2 weeks status post right shoulder reverse shoulder arthroplasty overall doing very well. Because this is a reverse for fracture I will asked that she remain in the sling for an additional 2 weeks. She may come out of this for passive range of motion at this time 1 month postop. That time she will begin following the reverse for fracture protocol. Will plan on outpatient physical therapy.  PAIN:  Are you having pain? Yes: NPRS scale: 0-3/10 this week Pain location: R shoulder and around R collar bone    Pain description:  unclear  Aggravating factors: unclear   Relieving factors: unclear     PRECAUTIONS: Other: reverse total shoulder for fracture protocol   WEIGHT BEARING RESTRICTIONS: Yes NWB surgical LE   FALLS:  Has patient fallen in last 6 months? Yes. Number of falls 2- one fall on ramp in the rain that led to fracture/surgery, other fall was the night she came home from the hospital and just passed out; FOF (+)  LIVING ENVIRONMENT: Lives with: lives with their spouse Lives in: House/apartment Stairs: No Has following equipment at home: Single point cane, Environmental consultant - 2 wheeled, Crutches, Wheelchair (manual), bed side commode, and Ramped entry + walk-in shower, shower bench   OCCUPATION: Retired   PLOF: Independent, Independent with basic ADLs, Independent with gait, and Independent with transfers  PATIENT GOALS: move my arm, be able to wash dishes, brush teeth R handed- generally improve level of function    OBJECTIVE:   PATIENT SURVEYS:  09/10/2022 FOTO 57 (Goal met)  08/25/22 FOTO 49  EVAL: FOTO 32, predicted 57  COGNITION: Overall cognitive status: Within functional limits for tasks assessed and very anxious about therapy      SENSATION: Reports numbness and burning in upper arm since surgery   POSTURE: Rounded  shoulders, forward head, increased thoracic kyphosis   UPPER EXTREMITY ROM:   Passive ROM Right eval Right 08/11/22 Right 08/13/2022 Right 08/17/22 Right 08/21/2022 Right 08/25/22 Right 08/26/2022 Right 09/02/22 Left/Right 09/10/2022 assessed supine  Right 09/29/2022 assessed supine  Shoulder flexion 45-50* P: 87 100 P: 110 Passive 110 AA: 136 110 PROM 120-130* supine, AAROM 120* supine   150/130  135  Shoulder scaption <20*       PROM 130-140*, 110* AAROM     Shoulder abduction  P: 78  P: 110  AA: 143 (Scaption)      Shoulder internal rotation   60  65  65  95/65 70  Shoulder external rotation -10* to -15* (edited 08/06/22, noted that I put ER value in IR column KEU) P: 20 30  35 AA: 35 40 PROM with arm at side 40-45*; AAROM 30-40*  60/50 55  Elbow flexion About 75% limited PROM, very guarded            Elbow extension WNL PROM but very guarded            (Blank rows = not tested)  UPPER EXTREMITY STRENGTH:  In pounds with hand-held dynamometer Right 09/10/2022 Left 09/10/2022 Right 09/29/2022  Shoulder flexion     Shoulder extension     Shoulder abduction     Shoulder adduction     Shoulder internal rotation 8.5 23.8 9.2  Shoulder external rotation < 3 pounds 18.5 4.2  (Blank  rows = not tested)- DNT at eval due to limitations in protocol    TODAY'S TREATMENT:                                                                                                                                         DATE: 10/02/22 TherEx UBE L2 x 8 min; forward only trying to use LLE mainly for RUE motion; seat 8 Supine AA shoulder flexion with Lt hand assisting 2x10 Active arm raise in scaption supine, initial assist from PT progressing to no assist; arm supported on towel lifting within available range 2x10 Seated Rt bicep curl 2# 2x10   09/29/2022 Shoulder blade pinches 2 sets of 10 for 5 seconds Supine arm raises (for scapular protraction) 3 sets of 10 (left hand guides but does not help) Side-lie ER 3 sets  of 10 with 1#, slow eccentrics Supine ER stretch with PT assist 10X 10 seconds Supine AAROM shoulder flexion, palm facing in, reach at top, left side assist 2 sets of 10 for 10 seconds   09/24/22 TherEx UBE L1 x 8 min; forward only trying to use LLE mainly for RUE motion; seat 8 Supine chest press into overhead flexion to tolerance AA with 1# bar 2x10 Supine chest press AA then active overhead and back to bar with RUE x10 reps Seated Rt active chest press (occasional assist from LUE) 2x10 Standing wall ladder 17-20 steps x 10 reps; flexion only; assist at Rt elbow to decrease arm dropping   09/22/22 TherEx UBE L1 x 8 min; forward only trying to use LLE mainly for RUE motion; seat 6 Standing wall ladder 15-19 steps x 10 reps; flexion only; assist at Rt elbow to decrease arm dropping Standing AA chest press with 1# bar 2x10 reps (limited range) Standing AA shoulder flexion with 1# bar 2x10 (PT assist with RUE) Supine chest press into overhead flexion to tolerance AA with 1# bar 2x10 Supine active shoulder flexion, min A for eccentric control x 10 reps Supine bicep stretch x 2 min   PATIENT EDUCATION: Education details: exam findings, POC, HEP; extensive education on typical recovery after shoulder surgery, also benefit of taking pain medicine prior to PT to make skilled interventions more tolerable  Person educated: Patient Education method: Explanation, Demonstration, and Handouts Education comprehension: verbalized understanding, returned demonstration, and needs further education  HOME EXERCISE PROGRAM: Access Code: [email or web address] URL: [email or web address] Date: 08/26/2022 Prepared by: Pauletta Browns  Exercises - Seated Shoulder Pendulum Exercise  - 3-5 x daily - 7 x weekly - 1 sets - 20 reps - Gentle Horizontal Shoulder Pendulum  - 3-5 x daily - 7 x weekly - 1 sets - 20 reps - Gentle Vertical Shoulder Pendulum  - 3-5 x daily - 7 x weekly - 1 sets - 20 reps - Standing  Scapular Retraction  - 5 x daily - 7 x weekly - 1 sets - 5  reps - 5 second hold - Supine Scapular Protraction in Flexion with Dumbbells  - 2-3 x daily - 7 x weekly - 1 sets - 10-20 reps - 3 seconds hold - Sidelying Shoulder External Rotation Dumbbell  - 1-2 x daily - 7 x weekly - 2 sets - 10 reps - 1 hold  ASSESSMENT:  CLINICAL IMPRESSION: Continued work on getting her shoulder moving more independently today without assist.  Overall able to get arm away from body independently today.  Will continue to benefit from PT to maximize function.  OBJECTIVE IMPAIRMENTS: decreased ROM, decreased strength, hypomobility, increased edema, increased fascial restrictions, increased muscle spasms, impaired flexibility, impaired UE functional use, postural dysfunction, obesity, and pain.   ACTIVITY LIMITATIONS: carrying, lifting, bed mobility, bathing, toileting, dressing, reach over head, and caring for others  PARTICIPATION LIMITATIONS: cleaning, laundry, driving, shopping, community activity, and yard work  PERSONAL FACTORS: Age, Behavior pattern, Education, Fitness, Past/current experiences, Sex, Social background, and Time since onset of injury/illness/exacerbation are also affecting patient's functional outcome.   REHAB POTENTIAL: Good  CLINICAL DECISION MAKING: Stable/uncomplicated  EVALUATION COMPLEXITY: Low   GOALS: Goals reviewed with patient? Yes  SHORT TERM GOALS: Target date: 09/02/2022   Will be compliant with appropriate progressive HEP  Baseline: Goal status: Met 08/21/2022  2.  R shoulder flexion and scaption PROM to be at least 100 degrees, R shoulder ER PROM to be at least 20 degrees at 0* ABD  Baseline:  Goal status: Met 08/13/2022  3.  R shoulder flexion and scaption AAROM to be at least 90 degrees, R shoulder ER AAROM to reach neutral  Baseline:  Goal status: MET 08/25/2022   4.  Will demonstrate better functional posture/postural mechanics  Baseline:  Goal status: Met  09/29/2022   LONG TERM GOALS: Target date: 10/30/2022    R shoulder flexion and scaption AROM to be at least 120 degrees  Baseline:  Goal status: Partially Met 09/29/2022 (Supine met, sitting/standing no)  2.  R shoulder ER AROM to be at least 30 degrees at 45* ABD  Baseline:  Goal status: Met 09/10/2022  3.  Will be able to reach L5 level with active FIR no increased pain  Baseline:  Goal status: On Going 09/29/22  4.  Will be able to perform all dressing/bathing tasks on a Mod(I) basis no increase in pain  Baseline:  Goal status: Met 09/25/2022  5.  Will be able to reach overhead to put dishes in the cabinet without increase in pain  Baseline:  Goal status: On Going 09/29/22   PLAN:  PT FREQUENCY: 2 x per week  PT DURATION: 4 weeks  PLANNED INTERVENTIONS: Therapeutic exercises, Therapeutic activity, Neuromuscular re-education, Balance training, Gait training, Patient/Family education, Self Care, Joint mobilization, Aquatic Therapy, Dry Needling, Electrical stimulation, Cryotherapy, Moist heat, Taping, Ultrasound, Ionotophoresis 4mg /ml Dexamethasone, Manual therapy, and Re-evaluation  PLAN FOR NEXT SESSION:  Continue AROM, scapular strength/functional work (with appropriate external resistance).  Progress per Dr. Serena Croissant protocol (check MD notes to see if able to move into next phase)  NEXT MD VISIT: 11/18/22  Clarita Crane, PT, DPT 10/02/22 9:22 AM

## 2022-10-02 NOTE — Telephone Encounter (Signed)
I spoke to [employer] at [facility] . She states pt needs an Rx for One touch glucose monitor , one touch lancets and one touch test strips or one touch verio test strips. Can we send these to [facility]

## 2022-10-02 NOTE — Telephone Encounter (Signed)
Ok to place orders for One touch monitor, lancets, and one touch test strips.  Can use instructions and # sent on previous prescriptions

## 2022-10-07 ENCOUNTER — Ambulatory Visit: Payer: Medicare HMO | Admitting: Physical Therapy

## 2022-10-07 ENCOUNTER — Encounter: Payer: Self-pay | Admitting: Physical Therapy

## 2022-10-07 DIAGNOSIS — R293 Abnormal posture: Secondary | ICD-10-CM | POA: Diagnosis not present

## 2022-10-07 DIAGNOSIS — M6281 Muscle weakness (generalized): Secondary | ICD-10-CM

## 2022-10-07 DIAGNOSIS — M25611 Stiffness of right shoulder, not elsewhere classified: Secondary | ICD-10-CM

## 2022-10-07 DIAGNOSIS — M25511 Pain in right shoulder: Secondary | ICD-10-CM

## 2022-10-07 NOTE — Therapy (Signed)
OUTPATIENT PHYSICAL THERAPY SHOULDER TREATMENT  Patient Name: Lacey [name] MRN: [id number] DOB:Jun 10, 1950, 72 y.o., female Today's Date: 10/07/2022      END OF SESSION:  PT End of Session - 10/07/22 1023     Visit Number 25    Number of Visits 31    Date for PT Re-Evaluation 10/30/22    Authorization Type [employer] MCR    Progress Note Due on Visit 29    PT Start Time 1017    PT Stop Time 1057    PT Time Calculation (min) 40 min    Activity Tolerance Patient tolerated treatment well;No increased pain    Behavior During Therapy WFL for tasks assessed/performed                Past Medical History:  Diagnosis Date   Adenomatous colon polyp    hyperplastic   Anemia    Anginal pain (HCC)    not recent   Arthritis    NECK, KNEES, FINGERS, TOES   Atrial tachycardia CARDIOLOGIST - DR Graciela Husbands (LAST VISIT AUG 2012)   Echo 12/11: EF 55-60%, Mild LVH, grade 1 diast dysfxn;   holter 1/12: ATach   Atypical chest pain    a. 07/2004 Cath: Clean cors;  b. 04/2010 Myoview: EF 63%, no ischemia   Blood transfusion without reported diagnosis 1969   CHF (congestive heart failure) (HCC)    Chronic kidney disease    left kidney small    Coronary artery disease    Diabetes mellitus, type 2 (HCC)    ORAL AND INSULIN MEDS (LAST A1C  7.3)   Diverticulosis    Erythema CURRENT-- CLOSED ABD. WALL ABSCESS   PER PCP NOTE (03-31-11)- MRSA-- TAKES DOXYCYCLINE   GERD (gastroesophageal reflux disease)    CONTROLLED W/ PREVACID   History of kidney stones 2012   Hyperlipidemia    Hypertension    Insomnia    TAKES MEDS PRN   Neuropathy, peripheral    Obesity (BMI 30-39.9) 02/19/2015   Pneumonia    as child   Post op infection 11/07/2012   left bunionectomy   Restless leg syndrome    Sepsis, urinary HISTORY - 2004   Sleep apnea    no cpap    Thyroid disease    TIA (transient ischemic attack)    "mini strokes" 2023   Tremor    Vitamin D deficiency    Past Surgical History:  Procedure  Laterality Date   ABDOMINAL HYSTERECTOMY  1995   ovaries remain   BUNIONECTOMY Left 10/24/2012   BUNIONECTOMY Right 05/17/2017   CARDIAC CATHETERIZATION  07/10/2004   CARPAL TUNNEL RELEASE  2000   RIGHT   CATARACT EXTRACTION, BILATERAL     CERVICAL FUSION  10/12/2007   C5 - 7   COLONOSCOPY     CYSTOSCOPY WITH RETROGRADE PYELOGRAM, URETEROSCOPY AND STENT PLACEMENT Left 11/28/2012   Procedure: LEFT RETROGRADE PYELOGRAM, LEFT URETEROSCOPY, ;  Surgeon: Garnett Farm, MD;  Location: [facility];  Service: Urology;  Laterality: Left;   CYSTOSCOPY/RETROGRADE/URETEROSCOPY/STONE EXTRACTION WITH BASKET  X2 2004 & 2009   LEFT    GASTRIC BYPASS  1981   KNEE ARTHROSCOPY  12/2010   LEFT   LAPAROSCOPIC CHOLECYSTECTOMY  2001   LEFT HEART CATH AND CORONARY ANGIOGRAPHY N/A 12/03/2021   Procedure: LEFT HEART CATH AND CORONARY ANGIOGRAPHY;  Surgeon: Corky Crafts, MD;  Location: [facility]IVE CV [facility];  Service: Cardiovascular;  Laterality: N/A;   left thumb joint surgery  2013   PERCUTANEOUS NEPHROSTOLITHOTOMY  02/27/2011   LEFT   POLYPECTOMY     REVERSE SHOULDER ARTHROPLASTY Right 07/12/2022   Procedure: REVERSE SHOULDER ARTHROPLASTY;  Surgeon: Huel Cote, MD;  Location: [facility];  Service: Orthopedics;  Laterality: Right;   RIGHT THUMB JOINT SURG.  09/2009   TRIGGER FINGER RELEASE  2010   RIGHT THUMB   UPPER GASTROINTESTINAL ENDOSCOPY     URETERAL STENT PLACEMENT  X2  2004  &  2009   LEFT   URETEROSCOPY  04/06/2011   Procedure: URETEROSCOPY;  Surgeon: Garnett Farm, MD;  Location: [facility];  Service: Urology;  Laterality: Left;  L Ureteroscopy Laser Litho & Stent    Patient Active Problem List   Diagnosis Date Noted   Anxiety 08/19/2022   Closed 3-part fracture of proximal end of right humerus 07/11/2022   Shoulder dislocation 07/11/2022   Abnormal stress test    Atypical chest pain 08/05/2021   Bradycardia 06/20/2021   Abnormal gait 03/17/2021   Acquired deformity of  lower leg 03/17/2021   Urinary urgency 11/14/2020   Paresthesia 08/15/2020   Right lumbar pain 08/15/2020   Tremor 08/15/2020   Hypoglycemia associated with diabetes (HCC) 02/29/2020   Essential (primary) hypertension 08/10/2019   Chronic neck pain 06/23/2016   Radicular low back pain 07/12/2015   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 without macular edema (HCC) 04/24/2015   Severe obesity (BMI >= 40) (HCC) 02/19/2015   OSA (obstructive sleep apnea) 10/23/2014   Excessive daytime sleepiness 10/23/2014   Hypothyroidism 09/07/2014   Tobacco abuse 01/15/2014   Recurrent UTI 09/06/2013   Orthostatic hypotension 10/05/2012   General medical examination 06/22/2011   Atrial tachycardia 11/25/2010   Chronic diarrhea 08/25/2010   KNEE PAIN 07/14/2010   Knee pain 07/14/2010   DIASTOLIC HEART FAILURE, CHRONIC 06/09/2010   PERSONAL HISTORY OF COLONIC POLYPS 05/01/2010   Vitamin D deficiency 01/20/2010   RESTLESS LEG SYNDROME 01/20/2010   Insomnia 01/20/2010   Restless leg syndrome 01/20/2010   Hyperlipidemia associated with type 2 diabetes mellitus (HCC) 01/08/2009   Anemia 01/08/2009   GERD 01/08/2009   Benign hypertensive heart disease with heart failure (HCC) 01/08/2009    PCP: Neena Rhymes MD   REFERRING PROVIDER: Huel Cote, MD  REFERRING DIAG: [phone] (ICD-10-CM) - Status post reverse arthroplasty of right shoulder  THERAPY DIAG:  Acute pain of right shoulder  Muscle weakness (generalized)  Abnormal posture  Stiffness of right shoulder, not elsewhere classified  Rationale for Evaluation and Treatment: Rehabilitation  ONSET DATE: Date of Surgery: 07/12/22  SUBJECTIVE:  SUBJECTIVE STATEMENT: Still having a hard time getting arm away  from her body.  Hand dominance: Right  PERTINENT HISTORY: 72 year old female is 2 weeks status post right shoulder reverse shoulder arthroplasty overall doing very well. Because this is a reverse for fracture I will asked that she remain in the sling for an additional 2 weeks. She may come out of this for passive range of motion at this time 1 month postop. That time she will begin following the reverse for fracture protocol. Will plan on outpatient physical therapy.  PAIN:  Are you having pain? Yes: NPRS scale: 0-3/10 this week Pain location: R shoulder and around R collar bone    Pain description:  unclear  Aggravating factors: unclear   Relieving factors: unclear     PRECAUTIONS: Other: reverse total shoulder for fracture protocol   WEIGHT BEARING RESTRICTIONS: Yes NWB surgical LE   FALLS:  Has patient fallen in last 6 months? Yes. Number of falls 2- one fall on ramp in the rain that led to fracture/surgery, other fall was the night she came home from the hospital and just passed out; FOF (+)  LIVING ENVIRONMENT: Lives with: lives with their spouse Lives in: House/apartment Stairs: No Has following equipment at home: Single point cane, Environmental consultant - 2 wheeled, Crutches, Wheelchair (manual), bed side commode, and Ramped entry + walk-in shower, shower bench   OCCUPATION: Retired   PLOF: Independent, Independent with basic ADLs, Independent with gait, and Independent with transfers  PATIENT GOALS: move my arm, be able to wash dishes, brush teeth R handed- generally improve level of function    OBJECTIVE:   PATIENT SURVEYS:  09/10/2022 FOTO 57 (Goal met)  08/25/22 FOTO 49  EVAL: FOTO 32, predicted 57  COGNITION: Overall cognitive status: Within functional limits for tasks assessed and very anxious about therapy      SENSATION: Reports numbness and burning in upper arm since surgery   POSTURE: Rounded shoulders, forward head, increased thoracic kyphosis   UPPER EXTREMITY  ROM:   Passive ROM Right eval Right 08/11/22 Right 08/13/2022 Right 08/17/22 Right 08/21/2022 Right 08/25/22 Right 08/26/2022 Right 09/02/22 Left/Right 09/10/2022 assessed supine  Right 09/29/2022 assessed supine  Shoulder flexion 45-50* P: 87 100 P: 110 Passive 110 AA: 136 110 PROM 120-130* supine, AAROM 120* supine   150/130  135  Shoulder scaption <20*       PROM 130-140*, 110* AAROM     Shoulder abduction  P: 78  P: 110  AA: 143 (Scaption)      Shoulder internal rotation   60  65  65  95/65 70  Shoulder external rotation -10* to -15* (edited 08/06/22, noted that I put ER value in IR column KEU) P: 20 30  35 AA: 35 40 PROM with arm at side 40-45*; AAROM 30-40*  60/50 55  Elbow flexion About 75% limited PROM, very guarded            Elbow extension WNL PROM but very guarded            (Blank rows = not tested)  UPPER EXTREMITY STRENGTH:  In pounds with hand-held dynamometer Right 09/10/2022 Left 09/10/2022 Right 09/29/2022  Shoulder flexion     Shoulder extension     Shoulder abduction     Shoulder adduction     Shoulder internal rotation 8.5 23.8 9.2  Shoulder external rotation < 3 pounds 18.5 4.2  (Blank rows = not tested)- DNT at eval due to limitations in protocol  TODAY'S TREATMENT:                                                                                                                                         DATE: 10/07/22 TherEx Wall ladder flexion x 10 reps 17-19 on ladder; min A for eccentric control UE ranger flexion AA x10 reps UBE L2 x 8 min forward only Supine shoulder flexion with elbow flexion active 2x10 Sidelying Rt shoulder abduction active with elbow flexed within available range; AA to A(significantly limited motion) 2x10   10/02/22 TherEx UBE L2 x 8 min; forward only trying to use LLE mainly for RUE motion; seat 8 Supine AA shoulder flexion with Lt hand assisting 2x10 Active arm raise in scaption supine, initial assist from PT progressing to no assist; arm  supported on towel lifting within available range 2x10 Seated Rt bicep curl 2# 2x10   09/29/2022 Shoulder blade pinches 2 sets of 10 for 5 seconds Supine arm raises (for scapular protraction) 3 sets of 10 (left hand guides but does not help) Side-lie ER 3 sets of 10 with 1#, slow eccentrics Supine ER stretch with PT assist 10X 10 seconds Supine AAROM shoulder flexion, palm facing in, reach at top, left side assist 2 sets of 10 for 10 seconds   09/24/22 TherEx UBE L1 x 8 min; forward only trying to use LLE mainly for RUE motion; seat 8 Supine chest press into overhead flexion to tolerance AA with 1# bar 2x10 Supine chest press AA then active overhead and back to bar with RUE x10 reps Seated Rt active chest press (occasional assist from LUE) 2x10 Standing wall ladder 17-20 steps x 10 reps; flexion only; assist at Rt elbow to decrease arm dropping   09/22/22 TherEx UBE L1 x 8 min; forward only trying to use LLE mainly for RUE motion; seat 6 Standing wall ladder 15-19 steps x 10 reps; flexion only; assist at Rt elbow to decrease arm dropping Standing AA chest press with 1# bar 2x10 reps (limited range) Standing AA shoulder flexion with 1# bar 2x10 (PT assist with RUE) Supine chest press into overhead flexion to tolerance AA with 1# bar 2x10 Supine active shoulder flexion, min A for eccentric control x 10 reps Supine bicep stretch x 2 min   PATIENT EDUCATION: Education details: exam findings, POC, HEP; extensive education on typical recovery after shoulder surgery, also benefit of taking pain medicine prior to PT to make skilled interventions more tolerable  Person educated: Patient Education method: Explanation, Demonstration, and Handouts Education comprehension: verbalized understanding, returned demonstration, and needs further education  HOME EXERCISE PROGRAM: Access Code: [email or web address] URL: [email or web address] Date: 08/26/2022 Prepared by: Pauletta Browns  Exercises - Seated Shoulder Pendulum Exercise  - 3-5 x daily - 7 x weekly - 1 sets - 20 reps - Gentle Horizontal Shoulder Pendulum  - 3-5 x daily - 7 x weekly - 1 sets -  20 reps - Gentle Vertical Shoulder Pendulum  - 3-5 x daily - 7 x weekly - 1 sets - 20 reps - Standing Scapular Retraction  - 5 x daily - 7 x weekly - 1 sets - 5 reps - 5 second hold - Supine Scapular Protraction in Flexion with Dumbbells  - 2-3 x daily - 7 x weekly - 1 sets - 10-20 reps - 3 seconds hold - Sidelying Shoulder External Rotation Dumbbell  - 1-2 x daily - 7 x weekly - 2 sets - 10 reps - 1 hold  ASSESSMENT:  CLINICAL IMPRESSION: Pt tolerated session well with continued work on maximizing functional motion at this time.  Will continue to benefit from PT to maximize function.  OBJECTIVE IMPAIRMENTS: decreased ROM, decreased strength, hypomobility, increased edema, increased fascial restrictions, increased muscle spasms, impaired flexibility, impaired UE functional use, postural dysfunction, obesity, and pain.   ACTIVITY LIMITATIONS: carrying, lifting, bed mobility, bathing, toileting, dressing, reach over head, and caring for others  PARTICIPATION LIMITATIONS: cleaning, laundry, driving, shopping, community activity, and yard work  PERSONAL FACTORS: Age, Behavior pattern, Education, Fitness, Past/current experiences, Sex, Social background, and Time since onset of injury/illness/exacerbation are also affecting patient's functional outcome.   REHAB POTENTIAL: Good  CLINICAL DECISION MAKING: Stable/uncomplicated  EVALUATION COMPLEXITY: Low   GOALS: Goals reviewed with patient? Yes  SHORT TERM GOALS: Target date: 09/02/2022   Will be compliant with appropriate progressive HEP  Baseline: Goal status: Met 08/21/2022  2.  R shoulder flexion and scaption PROM to be at least 100 degrees, R shoulder ER PROM to be at least 20 degrees at 0* ABD  Baseline:  Goal status: Met 08/13/2022  3.  R shoulder  flexion and scaption AAROM to be at least 90 degrees, R shoulder ER AAROM to reach neutral  Baseline:  Goal status: MET 08/25/2022   4.  Will demonstrate better functional posture/postural mechanics  Baseline:  Goal status: Met 09/29/2022   LONG TERM GOALS: Target date: 10/30/2022    R shoulder flexion and scaption AROM to be at least 120 degrees  Baseline:  Goal status: Partially Met 09/29/2022 (Supine met, sitting/standing no)  2.  R shoulder ER AROM to be at least 30 degrees at 45* ABD  Baseline:  Goal status: Met 09/10/2022  3.  Will be able to reach L5 level with active FIR no increased pain  Baseline:  Goal status: On Going 09/29/22  4.  Will be able to perform all dressing/bathing tasks on a Mod(I) basis no increase in pain  Baseline:  Goal status: Met 09/25/2022  5.  Will be able to reach overhead to put dishes in the cabinet without increase in pain  Baseline:  Goal status: On Going 09/29/22   PLAN:  PT FREQUENCY: 2 x per week  PT DURATION: 4 weeks  PLANNED INTERVENTIONS: Therapeutic exercises, Therapeutic activity, Neuromuscular re-education, Balance training, Gait training, Patient/Family education, Self Care, Joint mobilization, Aquatic Therapy, Dry Needling, Electrical stimulation, Cryotherapy, Moist heat, Taping, Ultrasound, Ionotophoresis 4mg /ml Dexamethasone, Manual therapy, and Re-evaluation  PLAN FOR NEXT SESSION:  Continue AROM working away from the body, scapular strength/functional work (with appropriate external resistance).  Progress per Dr. Serena Croissant protocol (check MD notes to see if able to move into next phase)  NEXT MD VISIT: 11/18/22  Clarita Crane, PT, DPT 10/07/22 10:59 AM

## 2022-10-08 ENCOUNTER — Telehealth: Payer: Self-pay

## 2022-10-08 NOTE — Telephone Encounter (Signed)
Spoke with Lacey [name] and she states she has 2 months of these supplies and she does not need any refills.

## 2022-10-08 NOTE — Telephone Encounter (Signed)
   Caller name: [facility]  On DPR?: Yes  Call back number:   Provider they see: Sheliah Hatch, MD  Reason for call:[facility] called in regards to patients medications. The meter and lancets are incorrect  The one touch verio reflect meter and the one touch delica plus 33g lancets ar the correct scripts that need to be sent in

## 2022-10-09 ENCOUNTER — Other Ambulatory Visit: Payer: Self-pay

## 2022-10-09 ENCOUNTER — Ambulatory Visit (INDEPENDENT_AMBULATORY_CARE_PROVIDER_SITE_OTHER): Payer: Medicare HMO | Admitting: Physical Therapy

## 2022-10-09 ENCOUNTER — Encounter: Payer: Self-pay | Admitting: Physical Therapy

## 2022-10-09 DIAGNOSIS — M25511 Pain in right shoulder: Secondary | ICD-10-CM

## 2022-10-09 DIAGNOSIS — M25611 Stiffness of right shoulder, not elsewhere classified: Secondary | ICD-10-CM | POA: Diagnosis not present

## 2022-10-09 DIAGNOSIS — M6281 Muscle weakness (generalized): Secondary | ICD-10-CM

## 2022-10-09 DIAGNOSIS — R293 Abnormal posture: Secondary | ICD-10-CM | POA: Diagnosis not present

## 2022-10-09 NOTE — Therapy (Signed)
OUTPATIENT PHYSICAL THERAPY SHOULDER TREATMENT  Patient Name: Lacey [name] MRN: [id number] DOB:07-13-1950, 72 y.o., female Today's Date: 10/09/2022      END OF SESSION:  PT End of Session - 10/09/22 0848     Visit Number 26    Number of Visits 31    Date for PT Re-Evaluation 10/30/22    Authorization Type [employer] MCR    Progress Note Due on Visit 29    PT Start Time 0840    PT Stop Time 0919    PT Time Calculation (min) 39 min    Activity Tolerance Patient tolerated treatment well;No increased pain    Behavior During Therapy WFL for tasks assessed/performed                 Past Medical History:  Diagnosis Date   Adenomatous colon polyp    hyperplastic   Anemia    Anginal pain (HCC)    not recent   Arthritis    NECK, KNEES, FINGERS, TOES   Atrial tachycardia CARDIOLOGIST - DR Graciela Husbands (LAST VISIT AUG 2012)   Echo 12/11: EF 55-60%, Mild LVH, grade 1 diast dysfxn;   holter 1/12: ATach   Atypical chest pain    a. 07/2004 Cath: Clean cors;  b. 04/2010 Myoview: EF 63%, no ischemia   Blood transfusion without reported diagnosis 1969   CHF (congestive heart failure) (HCC)    Chronic kidney disease    left kidney small    Coronary artery disease    Diabetes mellitus, type 2 (HCC)    ORAL AND INSULIN MEDS (LAST A1C  7.3)   Diverticulosis    Erythema CURRENT-- CLOSED ABD. WALL ABSCESS   PER PCP NOTE (03-31-11)- MRSA-- TAKES DOXYCYCLINE   GERD (gastroesophageal reflux disease)    CONTROLLED W/ PREVACID   History of kidney stones 2012   Hyperlipidemia    Hypertension    Insomnia    TAKES MEDS PRN   Neuropathy, peripheral    Obesity (BMI 30-39.9) 02/19/2015   Pneumonia    as child   Post op infection 11/07/2012   left bunionectomy   Restless leg syndrome    Sepsis, urinary HISTORY - 2004   Sleep apnea    no cpap    Thyroid disease    TIA (transient ischemic attack)    "mini strokes" 2023   Tremor    Vitamin D deficiency    Past Surgical History:  Procedure  Laterality Date   ABDOMINAL HYSTERECTOMY  1995   ovaries remain   BUNIONECTOMY Left 10/24/2012   BUNIONECTOMY Right 05/17/2017   CARDIAC CATHETERIZATION  07/10/2004   CARPAL TUNNEL RELEASE  2000   RIGHT   CATARACT EXTRACTION, BILATERAL     CERVICAL FUSION  10/12/2007   C5 - 7   COLONOSCOPY     CYSTOSCOPY WITH RETROGRADE PYELOGRAM, URETEROSCOPY AND STENT PLACEMENT Left 11/28/2012   Procedure: LEFT RETROGRADE PYELOGRAM, LEFT URETEROSCOPY, ;  Surgeon: Garnett Farm, MD;  Location: [facility];  Service: Urology;  Laterality: Left;   CYSTOSCOPY/RETROGRADE/URETEROSCOPY/STONE EXTRACTION WITH BASKET  X2 2004 & 2009   LEFT    GASTRIC BYPASS  1981   KNEE ARTHROSCOPY  12/2010   LEFT   LAPAROSCOPIC CHOLECYSTECTOMY  2001   LEFT HEART CATH AND CORONARY ANGIOGRAPHY N/A 12/03/2021   Procedure: LEFT HEART CATH AND CORONARY ANGIOGRAPHY;  Surgeon: Corky Crafts, MD;  Location: [facility];  Service: Cardiovascular;  Laterality: N/A;   left thumb joint surgery  2013   PERCUTANEOUS  NEPHROSTOLITHOTOMY  02/27/2011   LEFT   POLYPECTOMY     REVERSE SHOULDER ARTHROPLASTY Right 07/12/2022   Procedure: REVERSE SHOULDER ARTHROPLASTY;  Surgeon: Huel Cote, MD;  Location: [facility];  Service: Orthopedics;  Laterality: Right;   RIGHT THUMB JOINT SURG.  09/2009   TRIGGER FINGER RELEASE  2010   RIGHT THUMB   UPPER GASTROINTESTINAL ENDOSCOPY     URETERAL STENT PLACEMENT  X2  2004  &  2009   LEFT   URETEROSCOPY  04/06/2011   Procedure: URETEROSCOPY;  Surgeon: Garnett Farm, MD;  [facility];  Service: Urology;  Laterality: Left;  L Ureteroscopy Laser Litho & Stent    Patient Active Problem List   Diagnosis Date Noted   Anxiety 08/19/2022   Closed 3-part fracture of proximal end of right humerus 07/11/2022   Shoulder dislocation 07/11/2022   Abnormal stress test    Atypical chest pain 08/05/2021   Bradycardia 06/20/2021   Abnormal gait 03/17/2021   Acquired deformity of  lower leg 03/17/2021   Urinary urgency 11/14/2020   Paresthesia 08/15/2020   Right lumbar pain 08/15/2020   Tremor 08/15/2020   Hypoglycemia associated with diabetes (HCC) 02/29/2020   Essential (primary) hypertension 08/10/2019   Chronic neck pain 06/23/2016   Radicular low back pain 07/12/2015   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 without macular edema (HCC) 04/24/2015   Severe obesity (BMI >= 40) (HCC) 02/19/2015   OSA (obstructive sleep apnea) 10/23/2014   Excessive daytime sleepiness 10/23/2014   Hypothyroidism 09/07/2014   Tobacco abuse 01/15/2014   Recurrent UTI 09/06/2013   Orthostatic hypotension 10/05/2012   General medical examination 06/22/2011   Atrial tachycardia 11/25/2010   Chronic diarrhea 08/25/2010   KNEE PAIN 07/14/2010   Knee pain 07/14/2010   DIASTOLIC HEART FAILURE, CHRONIC 06/09/2010   PERSONAL HISTORY OF COLONIC POLYPS 05/01/2010   Vitamin D deficiency 01/20/2010   RESTLESS LEG SYNDROME 01/20/2010   Insomnia 01/20/2010   Restless leg syndrome 01/20/2010   Hyperlipidemia associated with type 2 diabetes mellitus (HCC) 01/08/2009   Anemia 01/08/2009   GERD 01/08/2009   Benign hypertensive heart disease with heart failure (HCC) 01/08/2009    PCP: Neena Rhymes MD   REFERRING PROVIDER: Huel Cote, MD  REFERRING DIAG: [phone] (ICD-10-CM) - Status post reverse arthroplasty of right shoulder  THERAPY DIAG:  Acute pain of right shoulder  Muscle weakness (generalized)  Abnormal posture  Stiffness of right shoulder, not elsewhere classified  Rationale for Evaluation and Treatment: Rehabilitation  ONSET DATE: Date of Surgery: 07/12/22  SUBJECTIVE:  SUBJECTIVE STATEMENT: Still having some pain in the shoulder;  worse with activity  Hand dominance: Right  PERTINENT HISTORY: 72 year old female is 2 weeks status post right shoulder reverse shoulder arthroplasty overall doing very well. Because this is a reverse for fracture I will asked that she remain in the sling for an additional 2 weeks. She may come out of this for passive range of motion at this time 1 month postop. That time she will begin following the reverse for fracture protocol. Will plan on outpatient physical therapy.  PAIN:  Are you having pain? Yes: NPRS scale: 0-3, currently 5/10 this week Pain location: R shoulder and around R collar bone    Pain description:  unclear  Aggravating factors: unclear   Relieving factors: unclear     PRECAUTIONS: Other: reverse total shoulder for fracture protocol   WEIGHT BEARING RESTRICTIONS: Yes NWB surgical LE   FALLS:  Has patient fallen in last 6 months? Yes. Number of falls 2- one fall on ramp in the rain that led to fracture/surgery, other fall was the night she came home from the hospital and just passed out; FOF (+)  LIVING ENVIRONMENT: Lives with: lives with their spouse Lives in: House/apartment Stairs: No Has following equipment at home: Single point cane, Environmental consultant - 2 wheeled, Crutches, Wheelchair (manual), bed side commode, and Ramped entry + walk-in shower, shower bench   OCCUPATION: Retired   PLOF: Independent, Independent with basic ADLs, Independent with gait, and Independent with transfers  PATIENT GOALS: move my arm, be able to wash dishes, brush teeth R handed- generally improve level of function    OBJECTIVE:   PATIENT SURVEYS:  09/10/2022 FOTO 57 (Goal met)  08/25/22 FOTO 49  EVAL: FOTO 32, predicted 57  COGNITION: Overall cognitive status: Within functional limits for tasks assessed and very anxious about therapy      SENSATION: Reports numbness and burning in upper arm since surgery   POSTURE: Rounded shoulders, forward head, increased thoracic kyphosis    UPPER EXTREMITY ROM:   Passive ROM Right eval Right 08/11/22 Right 08/13/2022 Right 08/17/22 Right 08/21/2022 Right 08/25/22 Right 08/26/2022 Right 09/02/22 Left/Right 09/10/2022 assessed supine  Right 09/29/2022 assessed supine  Shoulder flexion 45-50* P: 87 100 P: 110 Passive 110 AA: 136 110 PROM 120-130* supine, AAROM 120* supine   150/130  135  Shoulder scaption <20*       PROM 130-140*, 110* AAROM     Shoulder abduction  P: 78  P: 110  AA: 143 (Scaption)      Shoulder internal rotation   60  65  65  95/65 70  Shoulder external rotation -10* to -15* (edited 08/06/22, noted that I put ER value in IR column KEU) P: 20 30  35 AA: 35 40 PROM with arm at side 40-45*; AAROM 30-40*  60/50 55  Elbow flexion About 75% limited PROM, very guarded            Elbow extension WNL PROM but very guarded            (Blank rows = not tested)    Active ROM Right 10/09/22  Shoulder flexion Supine: 45  Shoulder abduction Sidelying: 27  Shoulder external rotation    (Blank rows = not tested)   UPPER EXTREMITY STRENGTH:  In pounds with hand-held dynamometer Right 09/10/2022 Left 09/10/2022 Right 09/29/2022  Shoulder flexion     Shoulder extension     Shoulder abduction     Shoulder adduction  Shoulder internal rotation 8.5 23.8 9.2  Shoulder external rotation < 3 pounds 18.5 4.2  (Blank rows = not tested)- DNT at eval due to limitations in protocol    TODAY'S TREATMENT:                                                                                                                                         DATE: 10/09/22 TherEx UBE L2 x 8 min forward only Supine shoulder flexion with elbow flexed active 2x10; improved today without any assist needed ROM measurements - see above for details Sidelying active abduction with elbow flexed 2x10 UE ranger flexion AA 2x10 reps Rows L3 band 2x10 Standing chest press with 1# bar 2x10  10/07/22 TherEx Wall ladder flexion x 10 reps 17-19 on ladder; min A for  eccentric control UE ranger flexion AA x10 reps UBE L2 x 8 min forward only Supine shoulder flexion with elbow flexion active 2x10 Sidelying Rt shoulder abduction active with elbow flexed within available range; AA to A(significantly limited motion) 2x10   10/02/22 TherEx UBE L2 x 8 min; forward only trying to use LLE mainly for RUE motion; seat 8 Supine AA shoulder flexion with Lt hand assisting 2x10 Active arm raise in scaption supine, initial assist from PT progressing to no assist; arm supported on towel lifting within available range 2x10 Seated Rt bicep curl 2# 2x10   09/29/2022 Shoulder blade pinches 2 sets of 10 for 5 seconds Supine arm raises (for scapular protraction) 3 sets of 10 (left hand guides but does not help) Side-lie ER 3 sets of 10 with 1#, slow eccentrics Supine ER stretch with PT assist 10X 10 seconds Supine AAROM shoulder flexion, palm facing in, reach at top, left side assist 2 sets of 10 for 10 seconds   09/24/22 TherEx UBE L1 x 8 min; forward only trying to use LLE mainly for RUE motion; seat 8 Supine chest press into overhead flexion to tolerance AA with 1# bar 2x10 Supine chest press AA then active overhead and back to bar with RUE x10 reps Seated Rt active chest press (occasional assist from LUE) 2x10 Standing wall ladder 17-20 steps x 10 reps; flexion only; assist at Rt elbow to decrease arm dropping   PATIENT EDUCATION: Education details: exam findings, POC, HEP; extensive education on typical recovery after shoulder surgery, also benefit of taking pain medicine prior to PT to make skilled interventions more tolerable  Person educated: Patient Education method: Explanation, Demonstration, and Handouts Education comprehension: verbalized understanding, returned demonstration, and needs further education  HOME EXERCISE PROGRAM: Access Code: [email or web address] URL: [email or web address] Date: 08/26/2022 Prepared by: Pauletta Browns  Exercises -  Seated Shoulder Pendulum Exercise  - 3-5 x daily - 7 x weekly - 1 sets - 20 reps - Gentle Horizontal Shoulder Pendulum  - 3-5 x daily - 7 x weekly - 1 sets - 20 reps - Gentle Vertical  Shoulder Pendulum  - 3-5 x daily - 7 x weekly - 1 sets - 20 reps - Standing Scapular Retraction  - 5 x daily - 7 x weekly - 1 sets - 5 reps - 5 second hold - Supine Scapular Protraction in Flexion with Dumbbells  - 2-3 x daily - 7 x weekly - 1 sets - 10-20 reps - 3 seconds hold - Sidelying Shoulder External Rotation Dumbbell  - 1-2 x daily - 7 x weekly - 2 sets - 10 reps - 1 hold  ASSESSMENT:  CLINICAL IMPRESSION: Starting to demonstrate improvement in active motion but still unable to do in standing.  Slowly progressing towards goals.  Continue skilled PT.  OBJECTIVE IMPAIRMENTS: decreased ROM, decreased strength, hypomobility, increased edema, increased fascial restrictions, increased muscle spasms, impaired flexibility, impaired UE functional use, postural dysfunction, obesity, and pain.   ACTIVITY LIMITATIONS: carrying, lifting, bed mobility, bathing, toileting, dressing, reach over head, and caring for others  PARTICIPATION LIMITATIONS: cleaning, laundry, driving, shopping, community activity, and yard work  PERSONAL FACTORS: Age, Behavior pattern, Education, Fitness, Past/current experiences, Sex, Social background, and Time since onset of injury/illness/exacerbation are also affecting patient's functional outcome.   REHAB POTENTIAL: Good  CLINICAL DECISION MAKING: Stable/uncomplicated  EVALUATION COMPLEXITY: Low   GOALS: Goals reviewed with patient? Yes  SHORT TERM GOALS: Target date: 09/02/2022   Will be compliant with appropriate progressive HEP  Baseline: Goal status: Met 08/21/2022  2.  R shoulder flexion and scaption PROM to be at least 100 degrees, R shoulder ER PROM to be at least 20 degrees at 0* ABD  Baseline:  Goal status: Met 08/13/2022  3.  R shoulder flexion and scaption AAROM to  be at least 90 degrees, R shoulder ER AAROM to reach neutral  Baseline:  Goal status: MET 08/25/2022   4.  Will demonstrate better functional posture/postural mechanics  Baseline:  Goal status: Met 09/29/2022   LONG TERM GOALS: Target date: 10/30/2022    R shoulder flexion and scaption AROM to be at least 120 degrees  Baseline:  Goal status: Partially Met 09/29/2022 (Supine met, sitting/standing no)  2.  R shoulder ER AROM to be at least 30 degrees at 45* ABD  Baseline:  Goal status: Met 09/10/2022  3.  Will be able to reach L5 level with active FIR no increased pain  Baseline:  Goal status: On Going 09/29/22  4.  Will be able to perform all dressing/bathing tasks on a Mod(I) basis no increase in pain  Baseline:  Goal status: Met 09/25/2022  5.  Will be able to reach overhead to put dishes in the cabinet without increase in pain  Baseline:  Goal status: On Going 09/29/22   PLAN:  PT FREQUENCY: 2 x per week  PT DURATION: 4 weeks  PLANNED INTERVENTIONS: Therapeutic exercises, Therapeutic activity, Neuromuscular re-education, Balance training, Gait training, Patient/Family education, Self Care, Joint mobilization, Aquatic Therapy, Dry Needling, Electrical stimulation, Cryotherapy, Moist heat, Taping, Ultrasound, Ionotophoresis 4mg /ml Dexamethasone, Manual therapy, and Re-evaluation  PLAN FOR NEXT SESSION:  AA/AROM working away from the body, scapular strength/functional work (with appropriate external resistance).  Progress per Dr. Serena Croissant protocol (check MD notes to see if able to move into next phase)  NEXT MD VISIT: 11/18/22  Clarita Crane, PT, DPT 10/09/22 9:19 AM

## 2022-10-13 ENCOUNTER — Encounter: Payer: Self-pay | Admitting: Physical Therapy

## 2022-10-13 ENCOUNTER — Ambulatory Visit: Payer: Medicare HMO | Admitting: Physical Therapy

## 2022-10-13 DIAGNOSIS — M6281 Muscle weakness (generalized): Secondary | ICD-10-CM

## 2022-10-13 DIAGNOSIS — M25511 Pain in right shoulder: Secondary | ICD-10-CM | POA: Diagnosis not present

## 2022-10-13 DIAGNOSIS — M25611 Stiffness of right shoulder, not elsewhere classified: Secondary | ICD-10-CM

## 2022-10-13 DIAGNOSIS — R293 Abnormal posture: Secondary | ICD-10-CM

## 2022-10-13 NOTE — Therapy (Signed)
OUTPATIENT PHYSICAL THERAPY SHOULDER TREATMENT  Patient Name: Lacey [name] MRN: [id number] DOB:November 24, 1950, 72 y.o., female Today's Date: 10/13/2022      END OF SESSION:  PT End of Session - 10/13/22 1053     Visit Number 27    Number of Visits 31    Date for PT Re-Evaluation 10/30/22    Authorization Type [employer] MCR    Progress Note Due on Visit 29    PT Start Time 1053    PT Stop Time 1133    PT Time Calculation (min) 40 min    Activity Tolerance Patient tolerated treatment well;No increased pain    Behavior During Therapy WFL for tasks assessed/performed                  Past Medical History:  Diagnosis Date   Adenomatous colon polyp    hyperplastic   Anemia    Anginal pain (HCC)    not recent   Arthritis    NECK, KNEES, FINGERS, TOES   Atrial tachycardia CARDIOLOGIST - DR Graciela Husbands (LAST VISIT AUG 2012)   Echo 12/11: EF 55-60%, Mild LVH, grade 1 diast dysfxn;   holter 1/12: ATach   Atypical chest pain    a. 07/2004 Cath: Clean cors;  b. 04/2010 Myoview: EF 63%, no ischemia   Blood transfusion without reported diagnosis 1969   CHF (congestive heart failure) (HCC)    Chronic kidney disease    left kidney small    Coronary artery disease    Diabetes mellitus, type 2 (HCC)    ORAL AND INSULIN MEDS (LAST A1C  7.3)   Diverticulosis    Erythema CURRENT-- CLOSED ABD. WALL ABSCESS   PER PCP NOTE (03-31-11)- MRSA-- TAKES DOXYCYCLINE   GERD (gastroesophageal reflux disease)    CONTROLLED W/ PREVACID   History of kidney stones 2012   Hyperlipidemia    Hypertension    Insomnia    TAKES MEDS PRN   Neuropathy, peripheral    Obesity (BMI 30-39.9) 02/19/2015   Pneumonia    as child   Post op infection 11/07/2012   left bunionectomy   Restless leg syndrome    Sepsis, urinary HISTORY - 2004   Sleep apnea    no cpap    Thyroid disease    TIA (transient ischemic attack)    "mini strokes" 2023   Tremor    Vitamin D deficiency    Past Surgical History:  Procedure  Laterality Date   ABDOMINAL HYSTERECTOMY  1995   ovaries remain   BUNIONECTOMY Left 10/24/2012   BUNIONECTOMY Right 05/17/2017   CARDIAC CATHETERIZATION  07/10/2004   CARPAL TUNNEL RELEASE  2000   RIGHT   CATARACT EXTRACTION, BILATERAL     CERVICAL FUSION  10/12/2007   C5 - 7   COLONOSCOPY     CYSTOSCOPY WITH RETROGRADE PYELOGRAM, URETEROSCOPY AND STENT PLACEMENT Left 11/28/2012   Procedure: LEFT RETROGRADE PYELOGRAM, LEFT URETEROSCOPY, ;  Surgeon: Garnett Farm, MD;  Location: [facility];  Service: Urology;  Laterality: Left;   CYSTOSCOPY/RETROGRADE/URETEROSCOPY/STONE EXTRACTION WITH BASKET  X2 2004 & 2009   LEFT    GASTRIC BYPASS  1981   KNEE ARTHROSCOPY  12/2010   LEFT   LAPAROSCOPIC CHOLECYSTECTOMY  2001   LEFT HEART CATH AND CORONARY ANGIOGRAPHY N/A 12/03/2021   Procedure: LEFT HEART CATH AND CORONARY ANGIOGRAPHY;  Surgeon: Corky Crafts, MD;  Location: [facility] INVASIVE CV LAB;  Service: Cardiovascular;  Laterality: N/A;   left thumb joint surgery  2013  PERCUTANEOUS NEPHROSTOLITHOTOMY  02/27/2011   LEFT   POLYPECTOMY     REVERSE SHOULDER ARTHROPLASTY Right 07/12/2022   Procedure: REVERSE SHOULDER ARTHROPLASTY;  Surgeon: Huel Cote, MD;  Location: [facility];  Service: Orthopedics;  Laterality: Right;   RIGHT THUMB JOINT SURG.  09/2009   TRIGGER FINGER RELEASE  2010   RIGHT THUMB   UPPER GASTROINTESTINAL ENDOSCOPY     URETERAL STENT PLACEMENT  X2  2004  &  2009   LEFT   URETEROSCOPY  04/06/2011   Procedure: URETEROSCOPY;  Surgeon: Garnett Farm, MD;  Location: [facility];  Service: Urology;  Laterality: Left;  L Ureteroscopy Laser Litho & Stent    Patient Active Problem List   Diagnosis Date Noted   Anxiety 08/19/2022   Closed 3-part fracture of proximal end of right humerus 07/11/2022   Shoulder dislocation 07/11/2022   Abnormal stress test    Atypical chest pain 08/05/2021   Bradycardia 06/20/2021   Abnormal gait 03/17/2021   Acquired deformity of  lower leg 03/17/2021   Urinary urgency 11/14/2020   Paresthesia 08/15/2020   Right lumbar pain 08/15/2020   Tremor 08/15/2020   Hypoglycemia associated with diabetes (HCC) 02/29/2020   Essential (primary) hypertension 08/10/2019   Chronic neck pain 06/23/2016   Radicular low back pain 07/12/2015   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 without macular edema (HCC) 04/24/2015   Severe obesity (BMI >= 40) (HCC) 02/19/2015   OSA (obstructive sleep apnea) 10/23/2014   Excessive daytime sleepiness 10/23/2014   Hypothyroidism 09/07/2014   Tobacco abuse 01/15/2014   Recurrent UTI 09/06/2013   Orthostatic hypotension 10/05/2012   General medical examination 06/22/2011   Atrial tachycardia 11/25/2010   Chronic diarrhea 08/25/2010   KNEE PAIN 07/14/2010   Knee pain 07/14/2010   DIASTOLIC HEART FAILURE, CHRONIC 06/09/2010   PERSONAL HISTORY OF COLONIC POLYPS 05/01/2010   Vitamin D deficiency 01/20/2010   RESTLESS LEG SYNDROME 01/20/2010   Insomnia 01/20/2010   Restless leg syndrome 01/20/2010   Hyperlipidemia associated with type 2 diabetes mellitus (HCC) 01/08/2009   Anemia 01/08/2009   GERD 01/08/2009   Benign hypertensive heart disease with heart failure (HCC) 01/08/2009    PCP: Neena Rhymes MD   REFERRING PROVIDER: Huel Cote, MD  REFERRING DIAG: [phone] (ICD-10-CM) - Status post reverse arthroplasty of right shoulder  THERAPY DIAG:  Acute pain of right shoulder  Muscle weakness (generalized)  Abnormal posture  Stiffness of right shoulder, not elsewhere classified  Rationale for Evaluation and Treatment: Rehabilitation  ONSET DATE: Date of Surgery: 07/12/22  SUBJECTIVE:  SUBJECTIVE STATEMENT: Shoulder is doing well; brushed her teeth  with her Rt hand and eating more with her Rt hand  Hand dominance: Right  PERTINENT HISTORY: 72 year old female is 2 weeks status post right shoulder reverse shoulder arthroplasty overall doing very well. Because this is a reverse for fracture I will asked that she remain in the sling for an additional 2 weeks. She may come out of this for passive range of motion at this time 1 month postop. That time she will begin following the reverse for fracture protocol. Will plan on outpatient physical therapy.  PAIN:  Are you having pain? Yes: NPRS scale: 0-3, currently 5/10 this week Pain location: R shoulder and around R collar bone    Pain description:  unclear  Aggravating factors: unclear   Relieving factors: unclear     PRECAUTIONS: Other: reverse total shoulder for fracture protocol   WEIGHT BEARING RESTRICTIONS: Yes NWB surgical LE   FALLS:  Has patient fallen in last 6 months? Yes. Number of falls 2- one fall on ramp in the rain that led to fracture/surgery, other fall was the night she came home from the hospital and just passed out; FOF (+)  LIVING ENVIRONMENT: Lives with: lives with their spouse Lives in: House/apartment Stairs: No Has following equipment at home: Single point cane, Environmental consultant - 2 wheeled, Crutches, Wheelchair (manual), bed side commode, and Ramped entry + walk-in shower, shower bench   OCCUPATION: Retired   PLOF: Independent, Independent with basic ADLs, Independent with gait, and Independent with transfers  PATIENT GOALS: move my arm, be able to wash dishes, brush teeth R handed- generally improve level of function    OBJECTIVE:   PATIENT SURVEYS:  09/10/2022 FOTO 57 (Goal met)  08/25/22 FOTO 49  EVAL: FOTO 32, predicted 57  COGNITION: Overall cognitive status: Within functional limits for tasks assessed and very anxious about therapy      SENSATION: Reports numbness and burning in upper arm since surgery   POSTURE: Rounded shoulders, forward head,  increased thoracic kyphosis   UPPER EXTREMITY ROM:   Passive ROM Right eval Right 08/11/22 Right 08/13/2022 Right 08/17/22 Right 08/21/2022 Right 08/25/22 Right 08/26/2022 Right 09/02/22 Left/Right 09/10/2022 assessed supine  Right 09/29/2022 assessed supine  Shoulder flexion 45-50* P: 87 100 P: 110 Passive 110 AA: 136 110 PROM 120-130* supine, AAROM 120* supine   150/130  135  Shoulder scaption <20*       PROM 130-140*, 110* AAROM     Shoulder abduction  P: 78  P: 110  AA: 143 (Scaption)      Shoulder internal rotation   60  65  65  95/65 70  Shoulder external rotation -10* to -15* (edited 08/06/22, noted that I put ER value in IR column KEU) P: 20 30  35 AA: 35 40 PROM with arm at side 40-45*; AAROM 30-40*  60/50 55  Elbow flexion About 75% limited PROM, very guarded            Elbow extension WNL PROM but very guarded            (Blank rows = not tested)    Active ROM Right 10/09/22 Right 10/13/22  Shoulder flexion Supine: 45 Supine: 60  Shoulder abduction Sidelying: 27   Shoulder external rotation     (Blank rows = not tested)   UPPER EXTREMITY STRENGTH:  In pounds with hand-held dynamometer Right 09/10/2022 Left 09/10/2022 Right 09/29/2022  Shoulder flexion     Shoulder extension  Shoulder abduction     Shoulder adduction     Shoulder internal rotation 8.5 23.8 9.2  Shoulder external rotation < 3 pounds 18.5 4.2  (Blank rows = not tested)- DNT at eval due to limitations in protocol    TODAY'S TREATMENT:                                                                                                                                         DATE: 10/13/22 TherEx UBE L2 x 8 min forward only Supine shoulder flexion with elbow flexed active 2x10; working on trying to get overhead Supine AA overhead flexion (~90-120) x 20 reps Supine full range flexion AA x 20 reps Sidelying AA/A abduction with elbow flexed 2x10 Seated Rt bicep curls 3x10; 3#   10/09/22 TherEx UBE L2 x 8 min forward  only Supine shoulder flexion with elbow flexed active 2x10; improved today without any assist needed ROM measurements - see above for details Sidelying active abduction with elbow flexed 2x10 UE ranger flexion AA 2x10 reps Rows L3 band 2x10 Standing chest press with 1# bar 2x10  10/07/22 TherEx Wall ladder flexion x 10 reps 17-19 on ladder; min A for eccentric control UE ranger flexion AA x10 reps UBE L2 x 8 min forward only Supine shoulder flexion with elbow flexion active 2x10 Sidelying Rt shoulder abduction active with elbow flexed within available range; AA to A(significantly limited motion) 2x10   PATIENT EDUCATION: Education details: exam findings, POC, HEP; extensive education on typical recovery after shoulder surgery, also benefit of taking pain medicine prior to PT to make skilled interventions more tolerable  Person educated: Patient Education method: Explanation, Demonstration, and Handouts Education comprehension: verbalized understanding, returned demonstration, and needs further education  HOME EXERCISE PROGRAM: Access Code: [email or web address] URL: [email or web address] Date: 08/26/2022 Prepared by: Pauletta Browns  Exercises - Seated Shoulder Pendulum Exercise  - 3-5 x daily - 7 x weekly - 1 sets - 20 reps - Gentle Horizontal Shoulder Pendulum  - 3-5 x daily - 7 x weekly - 1 sets - 20 reps - Gentle Vertical Shoulder Pendulum  - 3-5 x daily - 7 x weekly - 1 sets - 20 reps - Standing Scapular Retraction  - 5 x daily - 7 x weekly - 1 sets - 5 reps - 5 second hold - Supine Scapular Protraction in Flexion with Dumbbells  - 2-3 x daily - 7 x weekly - 1 sets - 10-20 reps - 3 seconds hold - Sidelying Shoulder External Rotation Dumbbell  - 1-2 x daily - 7 x weekly - 2 sets - 10 reps - 1 hold  ASSESSMENT:  CLINICAL IMPRESSION: Session today focused on as much active movement of shoulder as possible.  Continues to have difficulty with abduction and flexion overhead.  Continue skilled PT.  OBJECTIVE IMPAIRMENTS: decreased ROM, decreased strength, hypomobility, increased edema, increased fascial restrictions, increased muscle spasms, impaired flexibility,  impaired UE functional use, postural dysfunction, obesity, and pain.   ACTIVITY LIMITATIONS: carrying, lifting, bed mobility, bathing, toileting, dressing, reach over head, and caring for others  PARTICIPATION LIMITATIONS: cleaning, laundry, driving, shopping, community activity, and yard work  PERSONAL FACTORS: Age, Behavior pattern, Education, Fitness, Past/current experiences, Sex, Social background, and Time since onset of injury/illness/exacerbation are also affecting patient's functional outcome.   REHAB POTENTIAL: Good  CLINICAL DECISION MAKING: Stable/uncomplicated  EVALUATION COMPLEXITY: Low   GOALS: Goals reviewed with patient? Yes  SHORT TERM GOALS: Target date: 09/02/2022   Will be compliant with appropriate progressive HEP  Baseline: Goal status: Met 08/21/2022  2.  R shoulder flexion and scaption PROM to be at least 100 degrees, R shoulder ER PROM to be at least 20 degrees at 0* ABD  Baseline:  Goal status: Met 08/13/2022  3.  R shoulder flexion and scaption AAROM to be at least 90 degrees, R shoulder ER AAROM to reach neutral  Baseline:  Goal status: MET 08/25/2022   4.  Will demonstrate better functional posture/postural mechanics  Baseline:  Goal status: Met 09/29/2022   LONG TERM GOALS: Target date: 10/30/2022    R shoulder flexion and scaption AROM to be at least 120 degrees  Baseline:  Goal status: Partially Met 09/29/2022 (Supine met, sitting/standing no)  2.  R shoulder ER AROM to be at least 30 degrees at 45* ABD  Baseline:  Goal status: Met 09/10/2022  3.  Will be able to reach L5 level with active FIR no increased pain  Baseline:  Goal status: On Going 09/29/22  4.  Will be able to perform all dressing/bathing tasks on a Mod(I) basis no increase in pain   Baseline:  Goal status: Met 09/25/2022  5.  Will be able to reach overhead to put dishes in the cabinet without increase in pain  Baseline:  Goal status: On Going 09/29/22   PLAN:  PT FREQUENCY: 2 x per week  PT DURATION: 4 weeks  PLANNED INTERVENTIONS: Therapeutic exercises, Therapeutic activity, Neuromuscular re-education, Balance training, Gait training, Patient/Family education, Self Care, Joint mobilization, Aquatic Therapy, Dry Needling, Electrical stimulation, Cryotherapy, Moist heat, Taping, Ultrasound, Ionotophoresis 4mg /ml Dexamethasone, Manual therapy, and Re-evaluation  PLAN FOR NEXT SESSION:  continue AA/AROM working away from the body, scapular strength/functional work (with appropriate external resistance).  Progress per Dr. Serena Croissant protocol (check MD notes to see if able to move into next phase)  NEXT MD VISIT: 11/18/22  Clarita Crane, PT, DPT 10/13/22 11:37 AM

## 2022-10-15 ENCOUNTER — Ambulatory Visit: Payer: Medicare HMO | Admitting: Physical Therapy

## 2022-10-15 ENCOUNTER — Encounter: Payer: Self-pay | Admitting: Physical Therapy

## 2022-10-15 DIAGNOSIS — M25611 Stiffness of right shoulder, not elsewhere classified: Secondary | ICD-10-CM

## 2022-10-15 DIAGNOSIS — M6281 Muscle weakness (generalized): Secondary | ICD-10-CM

## 2022-10-15 DIAGNOSIS — M25511 Pain in right shoulder: Secondary | ICD-10-CM

## 2022-10-15 DIAGNOSIS — R293 Abnormal posture: Secondary | ICD-10-CM

## 2022-10-15 NOTE — Therapy (Signed)
OUTPATIENT PHYSICAL THERAPY SHOULDER TREATMENT  Patient Name: Lacey [name] MRN: [id number] DOB:01-22-1951, 72 y.o., female Today's Date: 10/15/2022           END OF SESSION:  PT End of Session - 10/15/22 1106     Visit Number 29    Number of Visits 34    Date for PT Re-Evaluation 11/20/22    Authorization Type [employer] MCR    Authorization Time Period 08/05/22 to 09/30/22; extended to 11/20/22    Progress Note Due on Visit 33    PT Start Time 1016    PT Stop Time 1056    PT Time Calculation (min) 40 min    Activity Tolerance Patient tolerated treatment well;No increased pain    Behavior During Therapy WFL for tasks assessed/performed                   Past Medical History:  Diagnosis Date   Adenomatous colon polyp    hyperplastic   Anemia    Anginal pain (HCC)    not recent   Arthritis    NECK, KNEES, FINGERS, TOES   Atrial tachycardia CARDIOLOGIST - DR Graciela Husbands (LAST VISIT AUG 2012)   Echo 12/11: EF 55-60%, Mild LVH, grade 1 diast dysfxn;   holter 1/12: ATach   Atypical chest pain    a. 07/2004 Cath: Clean cors;  b. 04/2010 Myoview: EF 63%, no ischemia   Blood transfusion without reported diagnosis 1969   CHF (congestive heart failure) (HCC)    Chronic kidney disease    left kidney small    Coronary artery disease    Diabetes mellitus, type 2 (HCC)    ORAL AND INSULIN MEDS (LAST A1C  7.3)   Diverticulosis    Erythema CURRENT-- CLOSED ABD. WALL ABSCESS   PER PCP NOTE (03-31-11)- MRSA-- TAKES DOXYCYCLINE   GERD (gastroesophageal reflux disease)    CONTROLLED W/ PREVACID   History of kidney stones 2012   Hyperlipidemia    Hypertension    Insomnia    TAKES MEDS PRN   Neuropathy, peripheral    Obesity (BMI 30-39.9) 02/19/2015   Pneumonia    as child   Post op infection 11/07/2012   left bunionectomy   Restless leg syndrome    Sepsis, urinary HISTORY - 2004   Sleep apnea    no cpap    Thyroid disease    TIA (transient ischemic attack)    "mini  strokes" 2023   Tremor    Vitamin D deficiency    Past Surgical History:  Procedure Laterality Date   ABDOMINAL HYSTERECTOMY  1995   ovaries remain   BUNIONECTOMY Left 10/24/2012   BUNIONECTOMY Right 05/17/2017   CARDIAC CATHETERIZATION  07/10/2004   CARPAL TUNNEL RELEASE  2000   RIGHT   CATARACT EXTRACTION, BILATERAL     CERVICAL FUSION  10/12/2007   C5 - 7   COLONOSCOPY     CYSTOSCOPY WITH RETROGRADE PYELOGRAM, URETEROSCOPY AND STENT PLACEMENT Left 11/28/2012   Procedure: LEFT RETROGRADE PYELOGRAM, LEFT URETEROSCOPY, ;  Surgeon: Garnett Farm, MD;  Location: [facility];  Service: Urology;  Laterality: Left;   CYSTOSCOPY/RETROGRADE/URETEROSCOPY/STONE EXTRACTION WITH BASKET  X2 2004 & 2009   LEFT    GASTRIC BYPASS  1981   KNEE ARTHROSCOPY  12/2010   LEFT   LAPAROSCOPIC CHOLECYSTECTOMY  2001   LEFT HEART CATH AND CORONARY ANGIOGRAPHY N/A 12/03/2021   Procedure: LEFT HEART CATH AND CORONARY ANGIOGRAPHY;  Surgeon: Corky Crafts, MD;  Location: [facility] INVASIVE  CV LAB;  Service: Cardiovascular;  Laterality: N/A;   left thumb joint surgery  2013   PERCUTANEOUS NEPHROSTOLITHOTOMY  02/27/2011   LEFT   POLYPECTOMY     REVERSE SHOULDER ARTHROPLASTY Right 07/12/2022   Procedure: REVERSE SHOULDER ARTHROPLASTY;  Surgeon: Huel Cote, MD;  Location: [facility];  Service: Orthopedics;  Laterality: Right;   RIGHT THUMB JOINT SURG.  09/2009   TRIGGER FINGER RELEASE  2010   RIGHT THUMB   UPPER GASTROINTESTINAL ENDOSCOPY     URETERAL STENT PLACEMENT  X2  2004  &  2009   LEFT   URETEROSCOPY  04/06/2011   Procedure: URETEROSCOPY;  Surgeon: Garnett Farm, MD;  Location: [facility];  Service: Urology;  Laterality: Left;  L Ureteroscopy Laser Litho & Stent    Patient Active Problem List   Diagnosis Date Noted   Anxiety 08/19/2022   Closed 3-part fracture of proximal end of right humerus 07/11/2022   Shoulder dislocation 07/11/2022   Abnormal stress test    Atypical chest pain  08/05/2021   Bradycardia 06/20/2021   Abnormal gait 03/17/2021   Acquired deformity of lower leg 03/17/2021   Urinary urgency 11/14/2020   Paresthesia 08/15/2020   Right lumbar pain 08/15/2020   Tremor 08/15/2020   Hypoglycemia associated with diabetes (HCC) 02/29/2020   Essential (primary) hypertension 08/10/2019   Chronic neck pain 06/23/2016   Radicular low back pain 07/12/2015   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 without macular edema (HCC) 04/24/2015   Severe obesity (BMI >= 40) (HCC) 02/19/2015   OSA (obstructive sleep apnea) 10/23/2014   Excessive daytime sleepiness 10/23/2014   Hypothyroidism 09/07/2014   Tobacco abuse 01/15/2014   Recurrent UTI 09/06/2013   Orthostatic hypotension 10/05/2012   General medical examination 06/22/2011   Atrial tachycardia 11/25/2010   Chronic diarrhea 08/25/2010   KNEE PAIN 07/14/2010   Knee pain 07/14/2010   DIASTOLIC HEART FAILURE, CHRONIC 06/09/2010   PERSONAL HISTORY OF COLONIC POLYPS 05/01/2010   Vitamin D deficiency 01/20/2010   RESTLESS LEG SYNDROME 01/20/2010   Insomnia 01/20/2010   Restless leg syndrome 01/20/2010   Hyperlipidemia associated with type 2 diabetes mellitus (HCC) 01/08/2009   Anemia 01/08/2009   GERD 01/08/2009   Benign hypertensive heart disease with heart failure (HCC) 01/08/2009    PCP: Neena Rhymes MD   REFERRING PROVIDER: Huel Cote, MD  REFERRING DIAG: [phone] (ICD-10-CM) - Status post reverse arthroplasty of right shoulder  THERAPY DIAG:  Acute pain of right shoulder  Muscle weakness (generalized)  Abnormal posture  Stiffness of right shoulder, not elsewhere classified  Rationale for Evaluation and Treatment: Rehabilitation  ONSET DATE: Date of Surgery: 07/12/22  SUBJECTIVE:  SUBJECTIVE STATEMENT:  I'm OK now. It usually hurts by the time I leave. Trying to do more at home, its still awhile before I see Dr. Steward Drone. I forget and I pick up heavy stuff, it doesn't take long to put things back down. Getting pain coming from the shoulder across my chest sometimes.   Hand dominance: Right  PERTINENT HISTORY: 72 year old female is 2 weeks status post right shoulder reverse shoulder arthroplasty overall doing very well. Because this is a reverse for fracture I will asked that she remain in the sling for an additional 2 weeks. She may come out of this for passive range of motion at this time 1 month postop. That time she will begin following the reverse for fracture protocol. Will plan on outpatient physical therapy.  PAIN:  Are you having pain? Yes: NPRS scale:2/10 Pain location: mid-upper arm, in bicep    Pain description:  numbness "but not as bad as it normally is"  Aggravating factors: moving it, picking up heavy stuff    Relieving factors: "it feels better on its own I guess"   PRECAUTIONS: Other: reverse total shoulder for fracture protocol   WEIGHT BEARING RESTRICTIONS: Yes NWB surgical LE   FALLS:  Has patient fallen in last 6 months? Yes. Number of falls 2- one fall on ramp in the rain that led to fracture/surgery, other fall was the night she came home from the hospital and just passed out; FOF (+)  LIVING ENVIRONMENT: Lives with: lives with their spouse Lives in: House/apartment Stairs: No Has following equipment at home: Single point cane, Environmental consultant - 2 wheeled, Crutches, Wheelchair (manual), bed side commode, and Ramped entry + walk-in shower, shower bench   OCCUPATION: Retired   PLOF: Independent, Independent with basic ADLs, Independent with gait, and Independent with transfers  PATIENT GOALS: move my arm, be able to wash dishes, brush teeth R handed- generally improve level of function    OBJECTIVE:   PATIENT SURVEYS:   09/10/2022 FOTO 57 (Goal met)  08/25/22 FOTO 49  EVAL: FOTO 32, predicted 57  COGNITION: Overall cognitive status: Within functional limits for tasks assessed and very anxious about therapy      SENSATION: Reports numbness and burning in upper arm since surgery   POSTURE: Rounded shoulders, forward head, increased thoracic kyphosis   UPPER EXTREMITY ROM:   Passive ROM Right eval Right 08/11/22 Right 08/13/2022 Right 08/17/22 Right 08/21/2022 Right 08/25/22 Right 08/26/2022 Right 09/02/22 Left/Right 09/10/2022 assessed supine  Right 09/29/2022 assessed supine Right 10/15/22  Shoulder flexion 45-50* P: 87 100 P: 110 Passive 110 AA: 136 110 PROM 120-130* supine, AAROM 120* supine   150/130  135 AAROM 120* supine  Shoulder scaption <20*       PROM 130-140*, 110* AAROM    AAROM 110* supine   Shoulder abduction  P: 78  P: 110  AA: 143 (Scaption)       Shoulder internal rotation   60  65  65  95/65 70 65* AAROM  Shoulder external rotation -10* to -15* (edited 08/06/22, noted that I put ER value in IR column KEU) P: 20 30  35 AA: 35 40 PROM with arm at side 40-45*; AAROM 30-40*  60/50 55 50* AAROM  Elbow flexion About 75% limited PROM, very guarded             Elbow extension WNL PROM but very guarded             (Blank rows = not tested)  Active ROM Right 10/09/22 Right 10/13/22  Shoulder flexion Supine: 45 Supine: 60  Shoulder abduction Sidelying: 27   Shoulder external rotation     (Blank rows = not tested)   UPPER EXTREMITY STRENGTH:  In pounds with hand-held dynamometer Right 09/10/2022 Left 09/10/2022 Right 09/29/2022  Shoulder flexion     Shoulder extension     Shoulder abduction     Shoulder adduction     Shoulder internal rotation 8.5 23.8 9.2  Shoulder external rotation < 3 pounds 18.5 4.2  (Blank rows = not tested)- DNT at eval due to limitations in protocol    TODAY'S TREATMENT:                                                                                                                                          DATE:   10/15/22  Objective measures for R shoulder AAROM for MD recert, education on POC moving forward- potential transition to independent HEP pending Dr. Serena Croissant thoughts at next MD appointment  FOTO 44  TherEX  UBE L2 x8 minutes forward only Chest press with 1# x10 supine  Supine shoulder flexion with elbow bent 2x10 ROM progressions as tolerated PT assist to keep in true flexion plane     10/13/22 TherEx UBE L2 x 8 min forward only Supine shoulder flexion with elbow flexed active 2x10; working on trying to get overhead Supine AA overhead flexion (~90-120) x 20 reps Supine full range flexion AA x 20 reps Sidelying AA/A abduction with elbow flexed 2x10 Seated Rt bicep curls 3x10; 3#   10/09/22 TherEx UBE L2 x 8 min forward only Supine shoulder flexion with elbow flexed active 2x10; improved today without any assist needed ROM measurements - see above for details Sidelying active abduction with elbow flexed 2x10 UE ranger flexion AA 2x10 reps Rows L3 band 2x10 Standing chest press with 1# bar 2x10  10/07/22 TherEx Wall ladder flexion x 10 reps 17-19 on ladder; min A for eccentric control UE ranger flexion AA x10 reps UBE L2 x 8 min forward only Supine shoulder flexion with elbow flexion active 2x10 Sidelying Rt shoulder abduction active with elbow flexed within available range; AA to A(significantly limited motion) 2x10   PATIENT EDUCATION: Education details: exam findings, POC, HEP; extensive education on typical recovery after shoulder surgery, also benefit of taking pain medicine prior to PT to make skilled interventions more tolerable  Person educated: Patient Education method: Explanation, Demonstration, and Handouts Education comprehension: verbalized understanding, returned demonstration, and needs further education  HOME EXERCISE PROGRAM: Access Code: [email or web address] URL: [email or web address] Date:  08/26/2022 Prepared by: Pauletta Browns  Exercises - Seated Shoulder Pendulum Exercise  - 3-5 x daily - 7 x weekly - 1 sets - 20 reps - Gentle Horizontal Shoulder Pendulum  - 3-5 x daily - 7 x weekly - 1 sets - 20 reps - Gentle Vertical Shoulder Pendulum  -  3-5 x daily - 7 x weekly - 1 sets - 20 reps - Standing Scapular Retraction  - 5 x daily - 7 x weekly - 1 sets - 5 reps - 5 second hold - Supine Scapular Protraction in Flexion with Dumbbells  - 2-3 x daily - 7 x weekly - 1 sets - 10-20 reps - 3 seconds hold - Sidelying Shoulder External Rotation Dumbbell  - 1-2 x daily - 7 x weekly - 2 sets - 10 reps - 1 hold  ASSESSMENT:  CLINICAL IMPRESSION:  Updated objective measures (ROM primarily) and goals and sent new cert to MD. Generally ROM has progressed since eval but remains somewhat variable, strength continues to be very limited at this time as well. Worked on AROM as time allowed, still very limited here even in supine. She sees her surgeon on 11/18/22, we will continue with 1x/week POC until that point and then consider potential DC to advanced HEP as she will likely have reached max benefit from skilled PT services at that point.   OBJECTIVE IMPAIRMENTS: decreased ROM, decreased strength, hypomobility, increased edema, increased fascial restrictions, increased muscle spasms, impaired flexibility, impaired UE functional use, postural dysfunction, obesity, and pain.   ACTIVITY LIMITATIONS: carrying, lifting, bed mobility, bathing, toileting, dressing, reach over head, and caring for others  PARTICIPATION LIMITATIONS: cleaning, laundry, driving, shopping, community activity, and yard work  PERSONAL FACTORS: Age, Behavior pattern, Education, Fitness, Past/current experiences, Sex, Social background, and Time since onset of injury/illness/exacerbation are also affecting patient's functional outcome.   REHAB POTENTIAL: Good  CLINICAL DECISION MAKING: Stable/uncomplicated  EVALUATION  COMPLEXITY: Low   GOALS: Goals reviewed with patient? Yes  SHORT TERM GOALS: Target date: 09/02/2022   Will be compliant with appropriate progressive HEP  Baseline: Goal status: Met 08/21/2022  2.  R shoulder flexion and scaption PROM to be at least 100 degrees, R shoulder ER PROM to be at least 20 degrees at 0* ABD  Baseline:  Goal status: Met 08/13/2022  3.  R shoulder flexion and scaption AAROM to be at least 90 degrees, R shoulder ER AAROM to reach neutral  Baseline:  Goal status: MET 08/25/2022   4.  Will demonstrate better functional posture/postural mechanics  Baseline:  Goal status: Met 09/29/2022   LONG TERM GOALS: Target date: 11/19/22    R shoulder flexion and scaption AROM to be at least 120 degrees  Baseline:  Goal status: Partially Met 09/29/2022 (Supine met, sitting/standing no)  2.  R shoulder ER AROM to be at least 30 degrees at 45* ABD  Baseline:  Goal status: Met 09/10/2022  3.  Will be able to reach L5 level with active FIR no increased pain  Baseline:  Goal status: On Going 10/15/22  4.  Will be able to perform all dressing/bathing tasks on a Mod(I) basis no increase in pain  Baseline:  Goal status: Met 09/25/2022  5.  Will be able to reach overhead to put dishes in the cabinet without increase in pain  Baseline:  Goal status: On Going 10/15/22   PLAN:  PT FREQUENCY: 1x/week    PT DURATION: 5 weeks  PLANNED INTERVENTIONS: Therapeutic exercises, Therapeutic activity, Neuromuscular re-education, Balance training, Gait training, Patient/Family education, Self Care, Joint mobilization, Aquatic Therapy, Dry Needling, Electrical stimulation, Cryotherapy, Moist heat, Taping, Ultrasound, Ionotophoresis 4mg /ml Dexamethasone, Manual therapy, and Re-evaluation  PLAN FOR NEXT SESSION:  continue AA/AROM working away from the body as tolerated, scapular strength/functional work (with appropriate external resistance).  Progress per Dr. Serena Croissant  protocol (check MD  notes to see if able to move into next phase)  NEXT MD VISIT: 11/18/22  Nedra Hai PT DPT PN2

## 2022-10-15 NOTE — Addendum Note (Signed)
Addended by: Wende Crease on: 10/15/2022 11:53 AM   Modules accepted: Orders

## 2022-10-18 ENCOUNTER — Other Ambulatory Visit: Payer: Self-pay | Admitting: Family Medicine

## 2022-10-18 DIAGNOSIS — G2581 Restless legs syndrome: Secondary | ICD-10-CM

## 2022-10-19 NOTE — Telephone Encounter (Signed)
Requesting: clonazepam Contract: N/A UDS:  N/A Last Visit: 10/01/22 Next Visit: 01/28/23 Last Refill: 05/11/22  (30,3)  Please Advise. Med pending

## 2022-10-20 ENCOUNTER — Ambulatory Visit: Payer: Medicare HMO | Admitting: Physical Therapy

## 2022-10-20 ENCOUNTER — Encounter: Payer: Self-pay | Admitting: Physical Therapy

## 2022-10-20 DIAGNOSIS — R293 Abnormal posture: Secondary | ICD-10-CM

## 2022-10-20 DIAGNOSIS — M25611 Stiffness of right shoulder, not elsewhere classified: Secondary | ICD-10-CM

## 2022-10-20 DIAGNOSIS — M6281 Muscle weakness (generalized): Secondary | ICD-10-CM

## 2022-10-20 DIAGNOSIS — M25511 Pain in right shoulder: Secondary | ICD-10-CM

## 2022-10-20 NOTE — Therapy (Signed)
OUTPATIENT PHYSICAL THERAPY SHOULDER TREATMENT  Patient Name: Lacey [name] MRN: [id number] DOB:Feb 21, 1951, 72 y.o., female Today's Date: 10/20/2022   END OF SESSION:  PT End of Session - 10/20/22 1058     Visit Number 30    Number of Visits 34    Date for PT Re-Evaluation 11/20/22    Authorization Type [employer] MCR    Authorization Time Period 08/05/22 to 09/30/22; extended to 11/20/22    Progress Note Due on Visit 33    PT Start Time 1058    PT Stop Time 1140    PT Time Calculation (min) 42 min    Activity Tolerance Patient tolerated treatment well;No increased pain    Behavior During Therapy WFL for tasks assessed/performed                 Past Medical History:  Diagnosis Date   Adenomatous colon polyp    hyperplastic   Anemia    Anginal pain (HCC)    not recent   Arthritis    NECK, KNEES, FINGERS, TOES   Atrial tachycardia CARDIOLOGIST - DR Graciela Husbands (LAST VISIT AUG 2012)   Echo 12/11: EF 55-60%, Mild LVH, grade 1 diast dysfxn;   holter 1/12: ATach   Atypical chest pain    a. 07/2004 Cath: Clean cors;  b. 04/2010 Myoview: EF 63%, no ischemia   Blood transfusion without reported diagnosis 1969   CHF (congestive heart failure) (HCC)    Chronic kidney disease    left kidney small    Coronary artery disease    Diabetes mellitus, type 2 (HCC)    ORAL AND INSULIN MEDS (LAST A1C  7.3)   Diverticulosis    Erythema CURRENT-- CLOSED ABD. WALL ABSCESS   PER PCP NOTE (03-31-11)- MRSA-- TAKES DOXYCYCLINE   GERD (gastroesophageal reflux disease)    CONTROLLED W/ PREVACID   History of kidney stones 2012   Hyperlipidemia    Hypertension    Insomnia    TAKES MEDS PRN   Neuropathy, peripheral    Obesity (BMI 30-39.9) 02/19/2015   Pneumonia    as child   Post op infection 11/07/2012   left bunionectomy   Restless leg syndrome    Sepsis, urinary HISTORY - 2004   Sleep apnea    no cpap    Thyroid disease    TIA (transient ischemic attack)    "mini strokes" 2023   Tremor     Vitamin D deficiency    Past Surgical History:  Procedure Laterality Date   ABDOMINAL HYSTERECTOMY  1995   ovaries remain   BUNIONECTOMY Left 10/24/2012   BUNIONECTOMY Right 05/17/2017   CARDIAC CATHETERIZATION  07/10/2004   CARPAL TUNNEL RELEASE  2000   RIGHT   CATARACT EXTRACTION, BILATERAL     CERVICAL FUSION  10/12/2007   C5 - 7   COLONOSCOPY     CYSTOSCOPY WITH RETROGRADE PYELOGRAM, URETEROSCOPY AND STENT PLACEMENT Left 11/28/2012   Procedure: LEFT RETROGRADE PYELOGRAM, LEFT URETEROSCOPY, ;  Surgeon: Garnett Farm, MD;  Location: [facility];  Service: Urology;  Laterality: Left;   CYSTOSCOPY/RETROGRADE/URETEROSCOPY/STONE EXTRACTION WITH BASKET  X2 2004 & 2009   LEFT    GASTRIC BYPASS  1981   KNEE ARTHROSCOPY  12/2010   LEFT   LAPAROSCOPIC CHOLECYSTECTOMY  2001   LEFT HEART CATH AND CORONARY ANGIOGRAPHY N/A 12/03/2021   Procedure: LEFT HEART CATH AND CORONARY ANGIOGRAPHY;  Surgeon: Corky Crafts, MD;  Location: [facility] IN[facility]AS[facility] CV LAB;  Service: Cardiovascular;  Laterality: N/A;  left thumb joint surgery  2013   PERCUTANEOUS NEPHROSTOLITHOTOMY  02/27/2011   LEFT   POLYPECTOMY     REVERSE SHOULDER ARTHROPLASTY Right 07/12/2022   Procedure: REVERSE SHOULDER ARTHROPLASTY;  Surgeon: Huel Cote, MD;  Location: [facility];  Service: Orthopedics;  Laterality: Right;   RIGHT THUMB JOINT SURG.  09/2009   TRIGGER FINGER RELEASE  2010   RIGHT THUMB   UPPER GASTROINTESTINAL ENDOSCOPY     URETERAL STENT PLACEMENT  X2  2004  &  2009   LEFT   URETEROSCOPY  04/06/2011   Procedure: URETEROSCOPY;  Surgeon: Garnett Farm, MD;  Location: [facility];  Service: Urology;  Laterality: Left;  L Ureteroscopy Laser Litho & Stent    Patient Active Problem List   Diagnosis Date Noted   Anxiety 08/19/2022   Closed 3-part fracture of proximal end of right humerus 07/11/2022   Shoulder dislocation 07/11/2022   Abnormal stress test    Atypical chest pain 08/05/2021   Bradycardia  06/20/2021   Abnormal gait 03/17/2021   Acquired deformity of lower leg 03/17/2021   Urinary urgency 11/14/2020   Paresthesia 08/15/2020   Right lumbar pain 08/15/2020   Tremor 08/15/2020   Hypoglycemia associated with diabetes (HCC) 02/29/2020   Essential (primary) hypertension 08/10/2019   Chronic neck pain 06/23/2016   Radicular low back pain 07/12/2015   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 without macular edema (HCC) 04/24/2015   Severe obesity (BMI >= 40) (HCC) 02/19/2015   OSA (obstructive sleep apnea) 10/23/2014   Excessive daytime sleepiness 10/23/2014   Hypothyroidism 09/07/2014   Tobacco abuse 01/15/2014   Recurrent UTI 09/06/2013   Orthostatic hypotension 10/05/2012   General medical examination 06/22/2011   Atrial tachycardia 11/25/2010   Chronic diarrhea 08/25/2010   KNEE PAIN 07/14/2010   Knee pain 07/14/2010   DIASTOLIC HEART FAILURE, CHRONIC 06/09/2010   PERSONAL HISTORY OF COLONIC POLYPS 05/01/2010   Vitamin D deficiency 01/20/2010   RESTLESS LEG SYNDROME 01/20/2010   Insomnia 01/20/2010   Restless leg syndrome 01/20/2010   Hyperlipidemia associated with type 2 diabetes mellitus (HCC) 01/08/2009   Anemia 01/08/2009   GERD 01/08/2009   Benign hypertensive heart disease with heart failure (HCC) 01/08/2009    PCP: Neena Rhymes MD   REFERRING PROVIDER: Huel Cote, MD  REFERRING DIAG: [phone] (ICD-10-CM) - Status post reverse arthroplasty of right shoulder  THERAPY DIAG:  Acute pain of right shoulder  Muscle weakness (generalized)  Abnormal posture  Stiffness of right shoulder, not elsewhere classified  Rationale for Evaluation and Treatment: Rehabilitation  ONSET DATE: Date of Surgery: 07/12/22  SUBJECTIVE:  SUBJECTIVE STATEMENT: Still unable to raise her arm. And it's still painful.  Hand dominance: Right  PERTINENT HISTORY: 72 year old female is 2 weeks status post right shoulder reverse shoulder arthroplasty overall doing very well. Because this is a reverse for fracture I will asked that she remain in the sling for an additional 2 weeks. She may come out of this for passive range of motion at this time 1 month postop. That time she will begin following the reverse for fracture protocol. Will plan on outpatient physical therapy.  PAIN:  Are you having pain? Yes: NPRS scale: 0/10 Pain location: mid-upper arm, in bicep    Pain description:  numbness "but not as bad as it normally is"  Aggravating factors: moving it, picking up heavy stuff    Relieving factors: "it feels better on its own I guess"   PRECAUTIONS: Other: reverse total shoulder for fracture protocol   WEIGHT BEARING RESTRICTIONS: Yes NWB surgical LE   FALLS:  Has patient fallen in last 6 months? Yes. Number of falls 2- one fall on ramp in the rain that led to fracture/surgery, other fall was the night she came home from the hospital and just passed out; FOF (+)  LIVING ENVIRONMENT: Lives with: lives with their spouse Lives in: House/apartment Stairs: No Has following equipment at home: Single point cane, Environmental consultant - 2 wheeled, Crutches, Wheelchair (manual), bed side commode, and Ramped entry + walk-in shower, shower bench   OCCUPATION: Retired   PLOF: Independent, Independent with basic ADLs, Independent with gait, and Independent with transfers  PATIENT GOALS: move my arm, be able to wash dishes, brush teeth R handed- generally improve level of function    OBJECTIVE:   PATIENT SURVEYS:  10/15/22: FOTO 44 09/10/2022 FOTO 57 (Goal met) 08/25/22 FOTO 49 EVAL: FOTO 32, predicted 57  COGNITION: Overall cognitive status: Within functional limits for tasks assessed and very anxious about therapy       SENSATION: Reports numbness and burning in upper arm since surgery   POSTURE: Rounded shoulders, forward head, increased thoracic kyphosis   UPPER EXTREMITY ROM:   Passive ROM Right eval Right 08/11/22 Right 08/13/2022 Right 08/17/22 Right 08/21/2022 Right 08/25/22 Right 08/26/2022 Right 09/02/22 Left/Right 09/10/2022 assessed supine  Right 09/29/2022 assessed supine Right 10/15/22  Shoulder flexion 45-50* P: 87 100 P: 110 Passive 110 AA: 136 110 PROM 120-130* supine, AAROM 120* supine   150/130  135 AAROM 120* supine  Shoulder scaption <20*       PROM 130-140*, 110* AAROM    AAROM 110* supine   Shoulder abduction  P: 78  P: 110  AA: 143 (Scaption)       Shoulder internal rotation   60  65  65  95/65 70 65* AAROM  Shoulder external rotation -10* to -15* (edited 08/06/22, noted that I put ER value in IR column KEU) P: 20 30  35 AA: 35 40 PROM with arm at side 40-45*; AAROM 30-40*  60/50 55 50* AAROM  Elbow flexion About 75% limited PROM, very guarded             Elbow extension WNL PROM but very guarded             (Blank rows = not tested)    Active ROM Right 10/09/22 Right 10/13/22  Shoulder flexion Supine: 45 Supine: 60  Shoulder abduction Sidelying: 27   Shoulder external rotation     (Blank rows = not tested)   UPPER EXTREMITY STRENGTH:  In pounds  with hand-held dynamometer Right 09/10/2022 Left 09/10/2022 Right 09/29/2022  Shoulder flexion     Shoulder extension     Shoulder abduction     Shoulder adduction     Shoulder internal rotation 8.5 23.8 9.2  Shoulder external rotation < 3 pounds 18.5 4.2  (Blank rows = not tested)- DNT at eval due to limitations in protocol    TODAY'S TREATMENT:                                                                                                                                         DATE: 10/20/22 TherEX Seated chest press 1# bar 2x10 Attempted overhead press with 1# bar AA with PT assist x10 Seated passive concentric and active  eccentric abduction x 10 reps with 1# bar (only to about 30 deg) UBE L3.5 x 8 minutes forward only Rows L4 band 2x10; 3 sec hold IR/ER reactive isometrics 2x10 each with L1 band on Rt (increased difficulty with ER)  10/15/22 Objective measures for R shoulder AAROM for MD recert, education on POC moving forward- potential transition to independent HEP pending Dr. Serena Croissant thoughts at next MD appointment  TherEX UBE L2 x8 minutes forward only Chest press with 1# x10 supine  Supine shoulder flexion with elbow bent 2x10 ROM progressions as tolerated PT assist to keep in true flexion plane   10/13/22 TherEx UBE L2 x 8 min forward only Supine shoulder flexion with elbow flexed active 2x10; working on trying to get overhead Supine AA overhead flexion (~90-120) x 20 reps Supine full range flexion AA x 20 reps Sidelying AA/A abduction with elbow flexed 2x10 Seated Rt bicep curls 3x10; 3#   10/09/22 TherEx UBE L2 x 8 min forward only Supine shoulder flexion with elbow flexed active 2x10; improved today without any assist needed ROM measurements - see above for details Sidelying active abduction with elbow flexed 2x10 UE ranger flexion AA 2x10 reps Rows L3 band 2x10 Standing chest press with 1# bar 2x10  10/07/22 TherEx Wall ladder flexion x 10 reps 17-19 on ladder; min A for eccentric control UE ranger flexion AA x10 reps UBE L2 x 8 min forward only Supine shoulder flexion with elbow flexion active 2x10 Sidelying Rt shoulder abduction active with elbow flexed within available range; AA to A(significantly limited motion) 2x10   PATIENT EDUCATION: Education details: exam findings, POC, HEP; extensive education on typical recovery after shoulder surgery, also benefit of taking pain medicine prior to PT to make skilled interventions more tolerable  Person educated: Patient Education method: Explanation, Demonstration, and Handouts Education comprehension: verbalized understanding,  returned demonstration, and needs further education  HOME EXERCISE PROGRAM: Access Code: [email or web address] URL: [email or web address] Date: 08/26/2022 Prepared by: Pauletta Browns  Exercises - Seated Shoulder Pendulum Exercise  - 3-5 x daily - 7 x weekly - 1 sets - 20 reps - Gentle Horizontal Shoulder Pendulum  - 3-5 x daily -  7 x weekly - 1 sets - 20 reps - Gentle Vertical Shoulder Pendulum  - 3-5 x daily - 7 x weekly - 1 sets - 20 reps - Standing Scapular Retraction  - 5 x daily - 7 x weekly - 1 sets - 5 reps - 5 second hold - Supine Scapular Protraction in Flexion with Dumbbells  - 2-3 x daily - 7 x weekly - 1 sets - 10-20 reps - 3 seconds hold - Sidelying Shoulder External Rotation Dumbbell  - 1-2 x daily - 7 x weekly - 2 sets - 10 reps - 1 hold  ASSESSMENT:  CLINICAL IMPRESSION: Continued work on strengthening as able and maximizing ROM.  Still unable to get arm actively away from body in sitting or standing.  Will continue to benefit from PT to maximize function.  OBJECTIVE IMPAIRMENTS: decreased ROM, decreased strength, hypomobility, increased edema, increased fascial restrictions, increased muscle spasms, impaired flexibility, impaired UE functional use, postural dysfunction, obesity, and pain.   ACTIVITY LIMITATIONS: carrying, lifting, bed mobility, bathing, toileting, dressing, reach over head, and caring for others  PARTICIPATION LIMITATIONS: cleaning, laundry, driving, shopping, community activity, and yard work  PERSONAL FACTORS: Age, Behavior pattern, Education, Fitness, Past/current experiences, Sex, Social background, and Time since onset of injury/illness/exacerbation are also affecting patient's functional outcome.   REHAB POTENTIAL: Good  CLINICAL DECISION MAKING: Stable/uncomplicated  EVALUATION COMPLEXITY: Low   GOALS: Goals reviewed with patient? Yes  SHORT TERM GOALS: Target date: 09/02/2022   Will be compliant with appropriate progressive HEP   Baseline: Goal status: Met 08/21/2022  2.  R shoulder flexion and scaption PROM to be at least 100 degrees, R shoulder ER PROM to be at least 20 degrees at 0* ABD  Baseline:  Goal status: Met 08/13/2022  3.  R shoulder flexion and scaption AAROM to be at least 90 degrees, R shoulder ER AAROM to reach neutral  Baseline:  Goal status: MET 08/25/2022   4.  Will demonstrate better functional posture/postural mechanics  Baseline:  Goal status: Met 09/29/2022   LONG TERM GOALS: Target date: 11/19/22    R shoulder flexion and scaption AROM to be at least 120 degrees  Baseline:  Goal status: Partially Met 09/29/2022 (Supine met, sitting/standing no)  2.  R shoulder ER AROM to be at least 30 degrees at 45* ABD  Baseline:  Goal status: Met 09/10/2022  3.  Will be able to reach L5 level with active FIR no increased pain  Baseline:  Goal status: On Going 10/15/22  4.  Will be able to perform all dressing/bathing tasks on a Mod(I) basis no increase in pain  Baseline:  Goal status: Met 09/25/2022  5.  Will be able to reach overhead to put dishes in the cabinet without increase in pain  Baseline:  Goal status: On Going 10/15/22   PLAN:  PT FREQUENCY: 1x/week    PT DURATION: 5 weeks  PLANNED INTERVENTIONS: Therapeutic exercises, Therapeutic activity, Neuromuscular re-education, Balance training, Gait training, Patient/Family education, Self Care, Joint mobilization, Aquatic Therapy, Dry Needling, Electrical stimulation, Cryotherapy, Moist heat, Taping, Ultrasound, Ionotophoresis 4mg /ml Dexamethasone, Manual therapy, and Re-evaluation  PLAN FOR NEXT SESSION:  continue AA/AROM working away from the body as tolerated, scapular strength/functional work (with appropriate external resistance).  Progress per Dr. Serena Croissant protocol (check MD notes to see if able to move into next phase)  NEXT MD VISIT: 11/18/22   Clarita Crane, PT, DPT 10/20/22 11:41 AM

## 2022-10-21 NOTE — Progress Notes (Signed)
Assessment/Plan:    Tremor Inderal LA was effective, but she had significant bradycardia and it had to be discontinued. She did have an abnormal DaTscan, albeit slightly so when I reviewed it.  She did not look parkinsonian today Skin biopsy was negative for alpha-synuclein.  This may mean she is at risk for a tauopathy (again, atypical state) but not idiopathic Parkinsons Disease.  However, she does not look like she has an atypical state Recent tremor is likely due to pain from her recent surgery and recovery, which has made tremor worse.  Nonetheless, she wants to increase to  primidone, 50 mg, 2 in the AM, 2 in the evening.  She is happy with efficacy 2.  History of cerebral infarction  -She is on aspirin, 81 mg daily  -Talked about importance of blood pressure control with a goal <130/80 mm Hg.   -Talked about importance of lipid control and proper diet.  Lipids should be managed intensively, with a goal LDL < 70 mg/dL.  She is on Lipitor, 20 mg daily.  2.   Hx of nephrolithiasis             -Drugs like topiramate and Zonegran would not be able to be used because of this for her tremor.  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  Tobacco abuse  -discussed importance of cessation but she isn't ready  Subjective:   Lacey [name] was seen today in follow up for testing results.  patient's primidone was slightly increased last visit and she reports that she was doing ok until the recent fall and then tremor increased.  She has been to physical therapy since last visit.  Those notes are reviewed.  Physical therapy was primarily for her right shoulder, as she has had right shoulder reverse arthroplasty completed.  This was due to the fact the patient fell.  She took her dog out and it was raining out and she slipped and fractured the right proximal humerus.  Its been a long recovery and she is still in PT.  Primary care saw the  patient not long thereafter and patient's mood was depressed.  She had sertraline, low-dose at 25 mg.  She is also noting some jerking in the arms and will "throw" her cigarette.  She is on gabapentin, 900 mg tid  Current movement disorder medications: Primidone, 50 mg, 2 tablets in the morning, 1 tablet in the evening (increased)  Prior medications: Propranolol (bradycardia)   ALLERGIES:  No Known Allergies  CURRENT MEDICATIONS:  Outpatient Encounter Medications as of 10/23/2022  Medication Sig   glucose blood test strip Use as instructed   OneTouch UltraSoft 2 Lancets MISC 1 Device by Does not apply route 4 (four) times daily.   acetaminophen (TYLENOL) 500 MG tablet Take 2 tablets (1,000 mg total) by mouth 3 (three) times daily.   albuterol (VENTOLIN HFA) 108 (90 Base) MCG/ACT inhaler TAKE 2 PUFFS BY MOUTH EVERY 6 HOURS AS NEEDED FOR WHEEZE OR SHORTNESS OF BREATH (Patient taking differently: Inhale 2 puffs into the lungs every 6 (six) hours as needed for wheezing or shortness of breath.)   Alcohol Swabs (DROPSAFE ALCOHOL PREP) 70 % PADS USE AS DIRECTED  AS NEEDED.   aspirin EC 81 MG tablet Take 1 tablet (81 mg total) by mouth daily.   atorvastatin (LIPITOR) 20 MG tablet TAKE 1 TABLET BY MOUTH EVERY DAY   Blood Glucose Monitoring  Suppl (TRUE METRIX METER) w/Device KIT Use as directed 3-4 times daily.  Dx- type 2 diabetes w/ use of insulin   calcium-vitamin D (OSCAL WITH D) 500-5 MG-MCG tablet Take 1 tablet by mouth.   cephALEXin (KEFLEX) 500 MG capsule Take 500 mg by mouth daily at 12 noon.   cholecalciferol (VITAMIN D) 25 MCG (1000 UNIT) tablet Take 1,000 Units by mouth daily.   Cholecalciferol (VITAMIN D-3) 25 MCG (1000 UT) CAPS Take 1,000 Units by mouth daily.   clonazePAM (KLONOPIN) 1 MG tablet TAKE 1 TABLET AT BEDTIME FOR RESTLESS LEGS SYNDROME   Continuous Blood Gluc Receiver (DEXCOM G6 RECEIVER) DEVI Use as directed w/ G6 sensor   Continuous Blood Gluc Sensor (DEXCOM G6 SENSOR) MISC  Apply sensor every 10 days for continuous blood sugar readings   Continuous Blood Gluc Transmit (DEXCOM G6 TRANSMITTER) MISC Use as directed w/ G6 sensor   diclofenac Sodium (VOLTAREN) 1 % GEL APPLY 2 GRAMS TO AFFECTED AREA 4 TIMES A DAY (Patient taking differently: Apply 2 g topically 4 (four) times daily as needed (for pain- hands and knees).)   fenofibrate 160 MG tablet Take 1 tablet (160 mg total) by mouth daily.   furosemide (LASIX) 20 MG tablet TAKE 1 TABLET EVERY DAY   gabapentin (NEURONTIN) 300 MG capsule Take 3 capsules (900 mg total) by mouth 3 (three) times daily.   glucose blood (TRUE METRIX BLOOD GLUCOSE TEST) test strip Use as instructed to test blood sugar 4 times daily. Dx. E11.9   insulin aspart (NOVOLOG FLEXPEN) 100 UNIT/ML FlexPen INJECT 25 UNITS SUBCUTANEOUSLY THREE TIMES DAILY WITH MEALS (Patient taking differently: Inject 25 Units into the skin 3 (three) times daily with meals.)   insulin glargine (LANTUS SOLOSTAR) 100 UNIT/ML Solostar Pen INJECT 60 UNITS UNDER THE SKIN AT BEDTIME (Patient taking differently: Inject 60 Units into the skin at bedtime.)   Insulin Pen Needle (DROPLET PEN NEEDLES) 31G X 8 MM MISC USE 4 TIMES DAILY   Lancets Super Thin 28G MISC Please use as directed to test sugars 4 times daily. Dx. E11.9   lansoprazole (PREVACID) 15 MG capsule Take 15 mg by mouth daily before breakfast.   levothyroxine (SYNTHROID) 50 MCG tablet TAKE 1 TABLET BY MOUTH EVERY DAY BEFORE BREAKFAST (Patient taking differently: Take 50 mcg by mouth daily before breakfast.)   loperamide (IMODIUM A-D) 2 MG tablet Take 2 mg by mouth 4 (four) times daily as needed for diarrhea or loose stools.   Oxycodone HCl 10 MG TABS Take 1 tablet (10 mg total) by mouth every 6 (six) hours as needed.   primidone (MYSOLINE) 50 MG tablet TAKE  2   TABLETS IN THE AM  AND TAKE 1 IN THE PM  BY MOUTH TWICE A DAY (Patient taking differently: Take 50-100 mg by mouth See admin instructions. Take 100 mg by mouth in  the morning and 50 mg in the evening)   sertraline (ZOLOFT) 50 MG tablet Take 1 tablet (50 mg total) by mouth daily.   traZODone (DESYREL) 100 MG tablet Take 1 tablet (100 mg total) by mouth at bedtime.   valsartan (DIOVAN) 320 MG tablet Take 1 tablet (320 mg total) by mouth daily.   verapamil (CALAN-SR) 120 MG CR tablet TAKE 1 TABLET BY MOUTH EVERYDAY AT BEDTIME (Patient taking differently: Take 120 mg by mouth at bedtime.)   Vibegron (GEMTESA) 75 MG TABS Take 1 tablet by mouth daily.   No facility-administered encounter medications on file as of 10/23/2022.  Objective:   PHYSICAL EXAMINATION:    VITALS:   Vitals:   10/23/22 0813  BP: (!) 175/60  Pulse: 63  SpO2: 95%  Weight: 207 lb 12.8 oz (94.3 kg)  Height: 5\' 2"  (1.575 m)    GEN:  The patient appears stated age and is in NAD. HEENT:  Normocephalic, atraumatic.  The mucous membranes are moist. The superficial temporal arteries are without ropiness or tenderness. CV:  RRR Lungs:  CTAB Neck/HEME:  There are no carotid bruits bilaterally.   Neurological examination:   Orientation: The patient is alert and oriented x3.  Cranial nerves: There is good facial symmetry.  Extraocular muscles are intact. The visual fields are full to confrontational testing. The speech is fluent and clear. Soft palate rises symmetrically and there is no tongue deviation. Hearing is intact to conversational tone. Sensation: Sensation is intact to light touch throughout  Motor: Strength is at least antigravity x 4.  Has trouble raising/abducting the R arm due to pain in the shoulder   Movement examination: Tone: There is normal tone in the bilateral upper extremities.  The tone in the lower extremities is normal.  Abnormal movements: no rest tremor today.  No trouble with archimedes spirals Coordination:  There is no decremation with RAM's, with any form of RAMS, including alternating supination and pronation of the forearm, hand opening and closing,  finger taps, heel taps and toe taps.   I have reviewed and interpreted the following labs independently    Chemistry      Component Value Date/Time   NA 137 10/01/2022 1105   NA 142 11/27/2021 1107   K 3.6 10/01/2022 1105   CL 102 10/01/2022 1105   CO2 26 10/01/2022 1105   BUN 11 10/01/2022 1105   BUN 15 11/27/2021 1107   CREATININE 0.76 10/01/2022 1105   CREATININE 0.89 11/18/2012 1433      Component Value Date/Time   CALCIUM 8.8 10/01/2022 1105   ALKPHOS 57 10/01/2022 1105   AST 13 10/01/2022 1105   ALT 8 10/01/2022 1105   BILITOT 0.3 10/01/2022 1105       Lab Results  Component Value Date   WBC 8.1 10/01/2022   HGB 13.4 10/01/2022   HCT 41.6 10/01/2022   MCV 93.8 10/01/2022   PLT 152.0 10/01/2022    Lab Results  Component Value Date   TSH 2.08 10/01/2022   Lab Results  Component Value Date   CHOL 153 10/01/2022   HDL 43.50 10/01/2022   LDLCALC 84 10/01/2022   LDLDIRECT 86.0 05/02/2015   TRIG 124.0 10/01/2022   CHOLHDL 4 10/01/2022   Lab Results  Component Value Date   HGBA1C 7.0 (H) 10/01/2022     Total time spent on today's visit was 31 minutes, including both face-to-face time and nonface-to-face time.  Time included that spent on review of records (prior notes available to me/labs/imaging if pertinent), discussing treatment and goals, answering patient's questions and coordinating care.  Cc:  Sheliah Hatch, MD

## 2022-10-22 ENCOUNTER — Encounter: Payer: Medicare HMO | Admitting: Rehabilitative and Restorative Service Providers"

## 2022-10-23 ENCOUNTER — Encounter: Payer: Self-pay | Admitting: Neurology

## 2022-10-23 ENCOUNTER — Ambulatory Visit: Payer: Medicare HMO | Admitting: Neurology

## 2022-10-23 VITALS — BP 160/76 | HR 63 | Ht 62.0 in | Wt 207.8 lb

## 2022-10-23 DIAGNOSIS — R251 Tremor, unspecified: Secondary | ICD-10-CM

## 2022-10-23 DIAGNOSIS — G253 Myoclonus: Secondary | ICD-10-CM

## 2022-10-23 MED ORDER — PRIMIDONE 50 MG PO TABS: 100.0000 mg | ORAL_TABLET | Freq: Two times a day (BID) | ORAL | 1 refills | Status: DC

## 2022-10-23 NOTE — Patient Instructions (Addendum)
Decrease gabapentin to 300 mg, 2 capsules three times per day Increase primidone 50 mg, 2 tablets twice per day

## 2022-10-26 ENCOUNTER — Ambulatory Visit (HOSPITAL_COMMUNITY)
Admission: RE | Admit: 2022-10-26 | Discharge: 2022-10-26 | Disposition: A | Discharge status: Home / Self Care | Payer: Medicare HMO | Source: Ambulatory Visit | Attending: Acute Care | Admitting: Acute Care

## 2022-10-26 DIAGNOSIS — Z87891 Personal history of nicotine dependence: Secondary | ICD-10-CM | POA: Diagnosis not present

## 2022-10-26 DIAGNOSIS — Z122 Encounter for screening for malignant neoplasm of respiratory organs: Secondary | ICD-10-CM

## 2022-10-26 DIAGNOSIS — I2510 Atherosclerotic heart disease of native coronary artery without angina pectoris: Secondary | ICD-10-CM | POA: Insufficient documentation

## 2022-10-26 DIAGNOSIS — F17210 Nicotine dependence, cigarettes, uncomplicated: Secondary | ICD-10-CM

## 2022-10-27 ENCOUNTER — Encounter: Payer: Self-pay | Admitting: Physical Therapy

## 2022-10-27 ENCOUNTER — Ambulatory Visit: Payer: Medicare HMO | Admitting: Physical Therapy

## 2022-10-27 DIAGNOSIS — R293 Abnormal posture: Secondary | ICD-10-CM | POA: Diagnosis not present

## 2022-10-27 DIAGNOSIS — M6281 Muscle weakness (generalized): Secondary | ICD-10-CM

## 2022-10-27 DIAGNOSIS — M25611 Stiffness of right shoulder, not elsewhere classified: Secondary | ICD-10-CM

## 2022-10-27 DIAGNOSIS — M25511 Pain in right shoulder: Secondary | ICD-10-CM | POA: Diagnosis not present

## 2022-10-27 NOTE — Therapy (Signed)
OUTPATIENT PHYSICAL THERAPY SHOULDER TREATMENT  Patient Name: Lacey [name] MRN: [id number] DOB:25-Sep-1950, 72 y.o., female Today's Date: 10/27/2022   END OF SESSION:  PT End of Session - 10/27/22 1014     Visit Number 31    Number of Visits 34    Date for PT Re-Evaluation 11/20/22    Authorization Type [employer] MCR    Authorization Time Period 08/05/22 to 09/30/22; extended to 11/20/22    Progress Note Due on Visit 33    PT Start Time 1012    PT Stop Time 1051    PT Time Calculation (min) 39 min    Activity Tolerance Patient tolerated treatment well;No increased pain    Behavior During Therapy WFL for tasks assessed/performed                  Past Medical History:  Diagnosis Date   Adenomatous colon polyp    hyperplastic   Anemia    Anginal pain (HCC)    not recent   Arthritis    NECK, KNEES, FINGERS, TOES   Atrial tachycardia CARDIOLOGIST - DR Graciela Husbands (LAST VISIT AUG 2012)   Echo 12/11: EF 55-60%, Mild LVH, grade 1 diast dysfxn;   holter 1/12: ATach   Atypical chest pain    a. 07/2004 Cath: Clean cors;  b. 04/2010 Myoview: EF 63%, no ischemia   Blood transfusion without reported diagnosis 1969   CHF (congestive heart failure) (HCC)    Chronic kidney disease    left kidney small    Coronary artery disease    Diabetes mellitus, type 2 (HCC)    ORAL AND INSULIN MEDS (LAST A1C  7.3)   Diverticulosis    Erythema CURRENT-- CLOSED ABD. WALL ABSCESS   PER PCP NOTE (03-31-11)- MRSA-- TAKES DOXYCYCLINE   GERD (gastroesophageal reflux disease)    CONTROLLED W/ PREVACID   History of kidney stones 2012   Hyperlipidemia    Hypertension    Insomnia    TAKES MEDS PRN   Neuropathy, peripheral    Obesity (BMI 30-39.9) 02/19/2015   Pneumonia    as child   Post op infection 11/07/2012   left bunionectomy   Restless leg syndrome    Sepsis, urinary HISTORY - 2004   Sleep apnea    no cpap    Thyroid disease    TIA (transient ischemic attack)    "mini strokes" 2023    Tremor    Vitamin D deficiency    Past Surgical History:  Procedure Laterality Date   ABDOMINAL HYSTERECTOMY  1995   ovaries remain   BUNIONECTOMY Left 10/24/2012   BUNIONECTOMY Right 05/17/2017   CARDIAC CATHETERIZATION  07/10/2004   CARPAL TUNNEL RELEASE  2000   RIGHT   CATARACT EXTRACTION, BILATERAL     CERVICAL FUSION  10/12/2007   C5 - 7   COLONOSCOPY     CYSTOSCOPY WITH RETROGRADE PYELOGRAM, URETEROSCOPY AND STENT PLACEMENT Left 11/28/2012   Procedure: LEFT RETROGRADE PYELOGRAM, LEFT URETEROSCOPY, ;  Surgeon: Garnett Farm, MD;  Location: [facility];  Service: Urology;  Laterality: Left;   CYSTOSCOPY/RETROGRADE/URETEROSCOPY/STONE EXTRACTION WITH BASKET  X2 2004 & 2009   LEFT    GASTRIC BYPASS  1981   KNEE ARTHROSCOPY  12/2010   LEFT   LAPAROSCOPIC CHOLECYSTECTOMY  2001   LEFT HEART CATH AND CORONARY ANGIOGRAPHY N/A 12/03/2021   Procedure: LEFT HEART CATH AND CORONARY ANGIOGRAPHY;  Surgeon: Corky Crafts, MD;  Location: [facility] INVASIVE [facility];  Service: Cardiovascular;  Laterality: N/A;  left thumb joint surgery  2013   PERCUTANEOUS NEPHROSTOLITHOTOMY  02/27/2011   LEFT   POLYPECTOMY     REVERSE SHOULDER ARTHROPLASTY Right 07/12/2022   Procedure: REVERSE SHOULDER ARTHROPLASTY;  Surgeon: Huel Cote, MD;  Location: [facility];  Service: Orthopedics;  Laterality: Right;   RIGHT THUMB JOINT SURG.  09/2009   TRIGGER FINGER RELEASE  2010   RIGHT THUMB   UPPER GASTROINTESTINAL ENDOSCOPY     URETERAL STENT PLACEMENT  X2  2004  &  2009   LEFT   URETEROSCOPY  04/06/2011   Procedure: URETEROSCOPY;  Surgeon: Garnett Farm, MD;  Location: [facility];  Service: Urology;  Laterality: Left;  L Ureteroscopy Laser Litho & Stent    Patient Active Problem List   Diagnosis Date Noted   Anxiety 08/19/2022   Closed 3-part fracture of proximal end of right humerus 07/11/2022   Shoulder dislocation 07/11/2022   Abnormal stress test    Atypical chest pain 08/05/2021    Bradycardia 06/20/2021   Abnormal gait 03/17/2021   Acquired deformity of lower leg 03/17/2021   Urinary urgency 11/14/2020   Paresthesia 08/15/2020   Right lumbar pain 08/15/2020   Tremor 08/15/2020   Hypoglycemia associated with diabetes (HCC) 02/29/2020   Essential (primary) hypertension 08/10/2019   Chronic neck pain 06/23/2016   Radicular low back pain 07/12/2015   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 without macular edema (HCC) 04/24/2015   Severe obesity (BMI >= 40) (HCC) 02/19/2015   OSA (obstructive sleep apnea) 10/23/2014   Excessive daytime sleepiness 10/23/2014   Hypothyroidism 09/07/2014   Tobacco abuse 01/15/2014   Recurrent UTI 09/06/2013   Orthostatic hypotension 10/05/2012   General medical examination 06/22/2011   Atrial tachycardia 11/25/2010   Chronic diarrhea 08/25/2010   KNEE PAIN 07/14/2010   Knee pain 07/14/2010   DIASTOLIC HEART FAILURE, CHRONIC 06/09/2010   PERSONAL HISTORY OF COLONIC POLYPS 05/01/2010   Vitamin D deficiency 01/20/2010   RESTLESS LEG SYNDROME 01/20/2010   Insomnia 01/20/2010   Restless leg syndrome 01/20/2010   Hyperlipidemia associated with type 2 diabetes mellitus (HCC) 01/08/2009   Anemia 01/08/2009   GERD 01/08/2009   Benign hypertensive heart disease with heart failure (HCC) 01/08/2009    PCP: Neena Rhymes MD   REFERRING PROVIDER: Huel Cote, MD  REFERRING DIAG: [phone] (ICD-10-CM) - Status post reverse arthroplasty of right shoulder  THERAPY DIAG:  Acute pain of right shoulder  Muscle weakness (generalized)  Abnormal posture  Stiffness of right shoulder, not elsewhere classified  Rationale for Evaluation and Treatment: Rehabilitation  ONSET DATE: Date of Surgery: 07/12/22  SUBJECTIVE:  SUBJECTIVE STATEMENT: Dr. [name] adjusted her meds and she feels her tremors are improved.  Hand dominance: Right  PERTINENT HISTORY: 72 year old female is 2 weeks status post right shoulder reverse shoulder arthroplasty overall doing very well. Because this is a reverse for fracture I will asked that she remain in the sling for an additional 2 weeks. She may come out of this for passive range of motion at this time 1 month postop. That time she will begin following the reverse for fracture protocol. Will plan on outpatient physical therapy.  PAIN:  Are you having pain? Yes: NPRS scale: 4 currently/10 Pain location: mid-upper arm, in bicep    Pain description:  numbness "but not as bad as it normally is"  Aggravating factors: moving it, picking up heavy stuff    Relieving factors: "it feels better on its own I guess"   PRECAUTIONS: Other: reverse total shoulder for fracture protocol   WEIGHT BEARING RESTRICTIONS: Yes NWB surgical LE   FALLS:  Has patient fallen in last 6 months? Yes. Number of falls 2- one fall on ramp in the rain that led to fracture/surgery, other fall was the night she came home from the hospital and just passed out; FOF (+)  LIVING ENVIRONMENT: Lives with: lives with their spouse Lives in: House/apartment Stairs: No Has following equipment at home: Single point cane, Environmental consultant - 2 wheeled, Crutches, Wheelchair (manual), bed side commode, and Ramped entry + walk-in shower, shower bench   OCCUPATION: Retired   PLOF: Independent, Independent with basic ADLs, Independent with gait, and Independent with transfers  PATIENT GOALS: move my arm, be able to wash dishes, brush teeth R handed- generally improve level of function    OBJECTIVE:   PATIENT SURVEYS:  10/15/22: FOTO 44 09/10/2022 FOTO 57 (Goal met) 08/25/22 FOTO 49 EVAL: FOTO 32, predicted 57  COGNITION: Overall cognitive status: Within functional limits for tasks assessed and very anxious about  therapy      SENSATION: Reports numbness and burning in upper arm since surgery   POSTURE: Rounded shoulders, forward head, increased thoracic kyphosis   UPPER EXTREMITY ROM:   Passive ROM Right eval Right 08/11/22 Right 08/13/2022 Right 08/17/22 Right 08/21/2022 Right 08/25/22 Right 08/26/2022 Right 09/02/22 Left/Right 09/10/2022 assessed supine  Right 09/29/2022 assessed supine Right 10/15/22  Shoulder flexion 45-50* P: 87 100 P: 110 Passive 110 AA: 136 110 PROM 120-130* supine, AAROM 120* supine   150/130  135 AAROM 120* supine  Shoulder scaption <20*       PROM 130-140*, 110* AAROM    AAROM 110* supine   Shoulder abduction  P: 78  P: 110  AA: 143 (Scaption)       Shoulder internal rotation   60  65  65  95/65 70 65* AAROM  Shoulder external rotation -10* to -15* (edited 08/06/22, noted that I put ER value in IR column KEU) P: 20 30  35 AA: 35 40 PROM with arm at side 40-45*; AAROM 30-40*  60/50 55 50* AAROM  Elbow flexion About 75% limited PROM, very guarded             Elbow extension WNL PROM but very guarded             (Blank rows = not tested)    Active ROM Right 10/09/22 Right 10/13/22  Shoulder flexion Supine: 45 Supine: 60  Shoulder abduction Sidelying: 27   Shoulder external rotation     (Blank rows = not tested)   UPPER EXTREMITY STRENGTH:  In pounds with hand-held dynamometer Right 09/10/2022 Left 09/10/2022 Right 09/29/2022  Shoulder flexion     Shoulder extension     Shoulder abduction     Shoulder adduction     Shoulder internal rotation 8.5 23.8 9.2  Shoulder external rotation < 3 pounds 18.5 4.2  (Blank rows = not tested)- DNT at eval due to limitations in protocol    TODAY'S TREATMENT:                                                                                                                                         DATE: 10/27/22 TherEx Supine active shoulder flexion with elbow flexed 2x10 Supine shoulder circles in 90 deg flexion x10 each direction Sidelying  Rt shoulder abduction with elbow flexed 2x10 - limited range but able to get arm away from body Seated Rt shoulder forward reach 2x10; limited range UBE L4 x 8 minutes forward only Wall ladder flexion x 10 reps; assist needed to descend Rows L4 band 2x10; 3 sec hold  10/20/22 TherEX Seated chest press 1# bar 2x10 Attempted overhead press with 1# bar AA with PT assist x10 Seated passive concentric and active eccentric abduction x 10 reps with 1# bar (only to about 30 deg) UBE L3.5 x 8 minutes forward only Rows L4 band 2x10; 3 sec hold IR/ER reactive isometrics 2x10 each with L1 band on Rt (increased difficulty with ER)  10/15/22 Objective measures for R shoulder AAROM for MD recert, education on POC moving forward- potential transition to independent HEP pending Dr. Serena Croissant thoughts at next MD appointment  TherEX UBE L2 x8 minutes forward only Chest press with 1# x10 supine  Supine shoulder flexion with elbow bent 2x10 ROM progressions as tolerated PT assist to keep in true flexion plane   10/13/22 TherEx UBE L2 x 8 min forward only Supine shoulder flexion with elbow flexed active 2x10; working on trying to get overhead Supine AA overhead flexion (~90-120) x 20 reps Supine full range flexion AA x 20 reps Sidelying AA/A abduction with elbow flexed 2x10 Seated Rt bicep curls 3x10; 3#   10/09/22 TherEx UBE L2 x 8 min forward only Supine shoulder flexion with elbow flexed active 2x10; improved today without any assist needed ROM measurements - see above for details Sidelying active abduction with elbow flexed 2x10 UE ranger flexion AA 2x10 reps Rows L3 band 2x10 Standing chest press with 1# bar 2x10  10/07/22 TherEx Wall ladder flexion x 10 reps 17-19 on ladder; min A for eccentric control UE ranger flexion AA x10 reps UBE L2 x 8 min forward only Supine shoulder flexion with elbow flexion active 2x10 Sidelying Rt shoulder abduction active with elbow flexed within available  range; AA to A(significantly limited motion) 2x10   PATIENT EDUCATION: Education details: exam findings, POC, HEP; extensive education on typical recovery after shoulder surgery, also benefit of taking pain medicine prior to  PT to make skilled interventions more tolerable  Person educated: Patient Education method: Explanation, Demonstration, and Handouts Education comprehension: verbalized understanding, returned demonstration, and needs further education  HOME EXERCISE PROGRAM: Access Code: [email or web address] URL: [email or web address] Date: 08/26/2022 Prepared by: Pauletta Browns  Exercises - Seated Shoulder Pendulum Exercise  - 3-5 x daily - 7 x weekly - 1 sets - 20 reps - Gentle Horizontal Shoulder Pendulum  - 3-5 x daily - 7 x weekly - 1 sets - 20 reps - Gentle Vertical Shoulder Pendulum  - 3-5 x daily - 7 x weekly - 1 sets - 20 reps - Standing Scapular Retraction  - 5 x daily - 7 x weekly - 1 sets - 5 reps - 5 second hold - Supine Scapular Protraction in Flexion with Dumbbells  - 2-3 x daily - 7 x weekly - 1 sets - 10-20 reps - 3 seconds hold - Sidelying Shoulder External Rotation Dumbbell  - 1-2 x daily - 7 x weekly - 2 sets - 10 reps - 1 hold  ASSESSMENT:  CLINICAL IMPRESSION: Continued slow progress with active motion of Rt shoulder at this time.  Will continue to benefit from PT to maximize function.  OBJECTIVE IMPAIRMENTS: decreased ROM, decreased strength, hypomobility, increased edema, increased fascial restrictions, increased muscle spasms, impaired flexibility, impaired UE functional use, postural dysfunction, obesity, and pain.   ACTIVITY LIMITATIONS: carrying, lifting, bed mobility, bathing, toileting, dressing, reach over head, and caring for others  PARTICIPATION LIMITATIONS: cleaning, laundry, driving, shopping, community activity, and yard work  PERSONAL FACTORS: Age, Behavior pattern, Education, Fitness, Past/current experiences, Sex, Social background, and  Time since onset of injury/illness/exacerbation are also affecting patient's functional outcome.   REHAB POTENTIAL: Good  CLINICAL DECISION MAKING: Stable/uncomplicated  EVALUATION COMPLEXITY: Low   GOALS: Goals reviewed with patient? Yes  SHORT TERM GOALS: Target date: 09/02/2022   Will be compliant with appropriate progressive HEP  Baseline: Goal status: Met 08/21/2022  2.  R shoulder flexion and scaption PROM to be at least 100 degrees, R shoulder ER PROM to be at least 20 degrees at 0* ABD  Baseline:  Goal status: Met 08/13/2022  3.  R shoulder flexion and scaption AAROM to be at least 90 degrees, R shoulder ER AAROM to reach neutral  Baseline:  Goal status: MET 08/25/2022   4.  Will demonstrate better functional posture/postural mechanics  Baseline:  Goal status: Met 09/29/2022   LONG TERM GOALS: Target date: 11/19/22    R shoulder flexion and scaption AROM to be at least 120 degrees  Baseline:  Goal status: Partially Met 09/29/2022 (Supine met, sitting/standing no)  2.  R shoulder ER AROM to be at least 30 degrees at 45* ABD  Baseline:  Goal status: Met 09/10/2022  3.  Will be able to reach L5 level with active FIR no increased pain  Baseline:  Goal status: On Going 10/15/22  4.  Will be able to perform all dressing/bathing tasks on a Mod(I) basis no increase in pain  Baseline:  Goal status: Met 09/25/2022  5.  Will be able to reach overhead to put dishes in the cabinet without increase in pain  Baseline:  Goal status: On Going 10/15/22   PLAN:  PT FREQUENCY: 1x/week    PT DURATION: 5 weeks  PLANNED INTERVENTIONS: Therapeutic exercises, Therapeutic activity, Neuromuscular re-education, Balance training, Gait training, Patient/Family education, Self Care, Joint mobilization, Aquatic Therapy, Dry Needling, Electrical stimulation, Cryotherapy, Moist heat, Taping, Ultrasound, Ionotophoresis 4mg /ml Dexamethasone, Manual therapy, and Re-evaluation  PLAN FOR NEXT  SESSION:  get strength measurements,  continue AA/AROM working away from the body as tolerated, scapular strength/functional work (with appropriate external resistance).  Progress per Dr. Serena Croissant protocol (check MD notes to see if able to move into next phase)  NEXT MD VISIT: 11/18/22   Clarita Crane, PT, DPT 10/27/22 10:54 AM

## 2022-10-29 ENCOUNTER — Encounter: Payer: Medicare HMO | Admitting: Physical Therapy

## 2022-11-03 ENCOUNTER — Encounter: Payer: Self-pay | Admitting: Rehabilitative and Restorative Service Providers"

## 2022-11-03 ENCOUNTER — Ambulatory Visit: Payer: Medicare HMO | Admitting: Rehabilitative and Restorative Service Providers"

## 2022-11-03 DIAGNOSIS — M25611 Stiffness of right shoulder, not elsewhere classified: Secondary | ICD-10-CM

## 2022-11-03 DIAGNOSIS — M25511 Pain in right shoulder: Secondary | ICD-10-CM | POA: Diagnosis not present

## 2022-11-03 DIAGNOSIS — M6281 Muscle weakness (generalized): Secondary | ICD-10-CM

## 2022-11-03 DIAGNOSIS — R293 Abnormal posture: Secondary | ICD-10-CM | POA: Diagnosis not present

## 2022-11-03 NOTE — Therapy (Addendum)
OUTPATIENT PHYSICAL THERAPY SHOULDER TREATMENT/PROGRESS NOTE   /DISCHARGE  Patient Name: Lacey [name] MRN: [id number] DOB:October 15, 1950, 72 y.o., female Today's Date: 11/03/2022   END OF SESSION:  PT End of Session - 11/03/22 1018     Visit Number 32    Number of Visits 34    Date for PT Re-Evaluation 11/20/22    Authorization Type [employer] MCR    Authorization Time Period 08/05/22 to 09/30/22; extended to 11/20/22    Authorization - Visit Number 24    Progress Note Due on Visit 33    PT Start Time 1014    PT Stop Time 1100    PT Time Calculation (min) 46 min    Activity Tolerance Patient tolerated treatment well;No increased pain    Behavior During Therapy [facility] for tasks assessed/performed            Progress Note Reporting Period 08/05/2022 to 11/03/2022  See note below for Objective Data and Assessment of Progress/Goals.     Past Medical History:  Diagnosis Date   Adenomatous colon polyp    hyperplastic   Anemia    Anginal pain (HCC)    not recent   Arthritis    NECK, KNEES, FINGERS, TOES   Atrial tachycardia CARDIOLOGIST - DR Graciela Husbands (LAST VISIT AUG 2012)   Echo 12/11: EF 55-60%, Mild LVH, grade 1 diast dysfxn;   holter 1/12: ATach   Atypical chest pain    a. 07/2004 Cath: Clean cors;  b. 04/2010 Myoview: EF 63%, no ischemia   Blood transfusion without reported diagnosis 1969   CHF (congestive heart failure) (HCC)    Chronic kidney disease    left kidney small    Coronary artery disease    Diabetes mellitus, type 2 (HCC)    ORAL AND INSULIN MEDS (LAST A1C  7.3)   Diverticulosis    Erythema CURRENT-- CLOSED ABD. WALL ABSCESS   PER PCP NOTE (03-31-11)- MRSA-- TAKES DOXYCYCLINE   GERD (gastroesophageal reflux disease)    CONTROLLED W/ PREVACID   History of kidney stones 2012   Hyperlipidemia    Hypertension    Insomnia    TAKES MEDS PRN   Neuropathy, peripheral    Obesity (BMI 30-39.9) 02/19/2015   Pneumonia    as child   Post op infection 11/07/2012   left  bunionectomy   Restless leg syndrome    Sepsis, urinary HISTORY - 2004   Sleep apnea    no cpap    Thyroid disease    TIA (transient ischemic attack)    "mini strokes" 2023   Tremor    Vitamin D deficiency    Past Surgical History:  Procedure Laterality Date   ABDOMINAL HYSTERECTOMY  1995   ovaries remain   BUNIONECTOMY Left 10/24/2012   BUNIONECTOMY Right 05/17/2017   CARDIAC CATHETERIZATION  07/10/2004   CARPAL TUNNEL RELEASE  2000   RIGHT   CATARACT EXTRACTION, BILATERAL     CERVICAL FUSION  10/12/2007   C5 - 7   COLONOSCOPY     CYSTOSCOPY WITH RETROGRADE PYELOGRAM, URETEROSCOPY AND STENT PLACEMENT Left 11/28/2012   Procedure: LEFT RETROGRADE PYELOGRAM, LEFT URETEROSCOPY, ;  Surgeon: Garnett Farm, MD;  Location: [facility];  Service: Urology;  Laterality: Left;   CYSTOSCOPY/RETROGRADE/URETEROSCOPY/STONE EXTRACTION WITH BASKET  X2 2004 & 2009   LEFT    GASTRIC BYPASS  1981   KNEE ARTHROSCOPY  12/2010   LEFT   LAPAROSCOPIC CHOLECYSTECTOMY  2001   LEFT HEART CATH AND CORONARY ANGIOGRAPHY N/A 12/03/2021  Procedure: LEFT HEART CATH AND CORONARY ANGIOGRAPHY;  Surgeon: Corky Crafts, MD;  Location: [facility] INVASIVE CV LAB;  Service: Cardiovascular;  Laterality: N/A;   left thumb joint surgery  2013   PERCUTANEOUS NEPHROSTOLITHOTOMY  02/27/2011   LEFT   POLYPECTOMY     REVERSE SHOULDER ARTHROPLASTY Right 07/12/2022   Procedure: REVERSE SHOULDER ARTHROPLASTY;  Surgeon: Huel Cote, MD;  Location: [facility];  Service: Orthopedics;  Laterality: Right;   RIGHT THUMB JOINT SURG.  09/2009   TRIGGER FINGER RELEASE  2010   RIGHT THUMB   UPPER GASTROINTESTINAL ENDOSCOPY     URETERAL STENT PLACEMENT  X2  2004  &  2009   LEFT   URETEROSCOPY  04/06/2011   Procedure: URETEROSCOPY;  Surgeon: Garnett Farm, MD;  Location: [facility];  Service: Urology;  Laterality: Left;  L Ureteroscopy Laser Litho & Stent    Patient Active Problem List   Diagnosis Date Noted   Anxiety  08/19/2022   Closed 3-part fracture of proximal end of right humerus 07/11/2022   Shoulder dislocation 07/11/2022   Abnormal stress test    Atypical chest pain 08/05/2021   Bradycardia 06/20/2021   Abnormal gait 03/17/2021   Acquired deformity of lower leg 03/17/2021   Urinary urgency 11/14/2020   Paresthesia 08/15/2020   Right lumbar pain 08/15/2020   Tremor 08/15/2020   Hypoglycemia associated with diabetes (HCC) 02/29/2020   Essential (primary) hypertension 08/10/2019   Chronic neck pain 06/23/2016   Radicular low back pain 07/12/2015   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 without macular edema (HCC) 04/24/2015   Severe obesity (BMI >= 40) (HCC) 02/19/2015   OSA (obstructive sleep apnea) 10/23/2014   Excessive daytime sleepiness 10/23/2014   Hypothyroidism 09/07/2014   Tobacco abuse 01/15/2014   Recurrent UTI 09/06/2013   Orthostatic hypotension 10/05/2012   General medical examination 06/22/2011   Atrial tachycardia 11/25/2010   Chronic diarrhea 08/25/2010   KNEE PAIN 07/14/2010   Knee pain 07/14/2010   DIASTOLIC HEART FAILURE, CHRONIC 06/09/2010   PERSONAL HISTORY OF COLONIC POLYPS 05/01/2010   Vitamin D deficiency 01/20/2010   RESTLESS LEG SYNDROME 01/20/2010   Insomnia 01/20/2010   Restless leg syndrome 01/20/2010   Hyperlipidemia associated with type 2 diabetes mellitus (HCC) 01/08/2009   Anemia 01/08/2009   GERD 01/08/2009   Benign hypertensive heart disease with heart failure (HCC) 01/08/2009    PCP: Neena Rhymes MD   REFERRING PROVIDER: Huel Cote, MD  REFERRING DIAG: [phone] (ICD-10-CM) - Status post reverse arthroplasty of right shoulder  THERAPY DIAG:  Acute pain of right shoulder  Muscle weakness (generalized)  Abnormal posture  Stiffness of right shoulder, not elsewhere classified  Rationale for Evaluation and Treatment: Rehabilitation  ONSET DATE: Date of Surgery:  07/12/22  SUBJECTIVE:  SUBJECTIVE STATEMENT: Lacey [name] notes she is making slow but significant progress with her strength and function on a weekly basis.  She notes feeling significantly more functional vs the last time I saw her in late May.  Hand dominance: Right  PERTINENT HISTORY: 72 year old female is 2 weeks status post right shoulder reverse shoulder arthroplasty overall doing very well. Because this is a reverse for fracture I will asked that she remain in the sling for an additional 2 weeks. She may come out of this for passive range of motion at this time 1 month postop. That time she will begin following the reverse for fracture protocol. Will plan on outpatient physical therapy.  PAIN:  Are you having pain? Yes: NPRS scale: 0-4/10 Pain location: mid-upper arm, in bicep    Pain description:  numbness "but not as bad as it normally is"  Aggravating factors: moving it, picking up heavy stuff    Relieving factors: Rest  PRECAUTIONS: Other: reverse total shoulder for fracture protocol   WEIGHT BEARING RESTRICTIONS: Yes NWB surgical LE   FALLS:  Has patient fallen in last 6 months? Yes. Number of falls 2- one fall on ramp in the rain that led to fracture/surgery, other fall was the night she came home from the hospital and just passed out; FOF (+)  LIVING ENVIRONMENT: Lives with: lives with their spouse Lives in: House/apartment Stairs: No Has following equipment at home: Single point cane, Environmental consultant - 2 wheeled, Crutches, Wheelchair (manual), bed side commode, and Ramped entry + walk-in shower, shower bench   OCCUPATION: Retired   PLOF: Independent, Independent with basic ADLs, Independent with gait, and Independent with transfers  PATIENT GOALS: move my arm, be able to wash dishes, brush teeth R handed-  generally improve level of function    OBJECTIVE:   PATIENT SURVEYS:  11/03/2022: FOTO 68 10/15/22: FOTO 44 09/10/2022 FOTO 57 (Goal met) 08/25/22 FOTO 49 EVAL: FOTO 32, predicted 57  COGNITION: Overall cognitive status: Within functional limits for tasks assessed and very anxious about therapy      SENSATION: Reports numbness and burning in upper arm since surgery   POSTURE: Rounded shoulders, forward head, increased thoracic kyphosis   UPPER EXTREMITY ROM:   Passive ROM Right eval Right 08/11/22 Right 08/13/2022 Right 08/17/22 Right 08/21/2022 Right 08/25/22 Right 08/26/2022 Right 09/02/22 Left/Right 09/10/2022 assessed supine  Right 09/29/2022 assessed supine Right 10/15/22 Right 11/03/2022  Shoulder flexion 45-50* P: 87 100 P: 110 Passive 110 AA: 136 110 PROM 120-130* supine, AAROM 120* supine   150/130  135 AAROM 120* supine 135 AAROM supine  Shoulder scaption <20*       PROM 130-140*, 110* AAROM    AAROM 110* supine    Shoulder abduction  P: 78  P: 110  AA: 143 (Scaption)        Shoulder internal rotation   60  65  65  95/65 70 65* AAROM 60 AROM  Shoulder external rotation -10* to -15* (edited 08/06/22, noted that I put ER value in IR column KEU) P: 20 30  35 AA: 35 40 PROM with arm at side 40-45*; AAROM 30-40*  60/50 55 50* AAROM 55 AROM  Elbow flexion About 75% limited PROM, very guarded              Elbow extension WNL PROM but very guarded              (Blank rows = not tested)    Active ROM Right 10/09/22 Right 10/13/22  Shoulder flexion Supine: 45 Supine: 60  Shoulder abduction Sidelying: 27   Shoulder external rotation     (Blank rows = not tested)   UPPER EXTREMITY STRENGTH:  In pounds with hand-held dynamometer Right 09/10/2022 Left 09/10/2022 Right 09/29/2022 Right 11/03/2022  Shoulder flexion      Shoulder extension      Shoulder abduction      Shoulder adduction      Shoulder internal rotation 8.5 23.8 9.2 17.4  Shoulder external rotation < 3 pounds 18.5 4.2 5.0  (Blank  rows = not tested)- DNT at eval due to limitations in protocol    TODAY'S TREATMENT:                                                                                                                                         DATE: 11/03/2022 Shoulder blade pinches 2 sets of 10 for 5 seconds Supine arm raises (for scapular protraction) 3 sets of 10 (left hand does not help at all) Side-lie ER 3 sets of 10 with 1#, slow eccentrics Supine ER stretch 10X 10 seconds Supine AAROM shoulder flexion, palm facing in, reach at top, left side assist 2 sets of 10 for 10 seconds   10/27/22 TherEx Supine active shoulder flexion with elbow flexed 2x10 Supine shoulder circles in 90 deg flexion x10 each direction Sidelying Rt shoulder abduction with elbow flexed 2x10 - limited range but able to get arm away from body Seated Rt shoulder forward reach 2x10; limited range UBE L4 x 8 minutes forward only Wall ladder flexion x 10 reps; assist needed to descend Rows L4 band 2x10; 3 sec hold   10/20/22 TherEX Seated chest press 1# bar 2x10 Attempted overhead press with 1# bar AA with PT assist x10 Seated passive concentric and active eccentric abduction x 10 reps with 1# bar (only to about 30 deg) UBE L3.5 x 8 minutes forward only Rows L4 band 2x10; 3 sec hold IR/ER reactive isometrics 2x10 each with L1 band on Rt (increased difficulty with ER)  PATIENT EDUCATION: Education details: exam findings, POC, HEP; extensive education on typical recovery after shoulder surgery, also benefit of taking pain medicine prior to PT to make skilled interventions more tolerable  Person educated: Patient Education method: Explanation, Demonstration, and Handouts Education comprehension: verbalized understanding, returned demonstration, and needs further education  HOME EXERCISE PROGRAM: Access Code: [email or web address] URL: [email or web address] Date: 08/26/2022 Prepared by: Pauletta Browns  Exercises - Seated Shoulder  Pendulum Exercise  - 3-5 x daily - 7 x weekly - 1 sets - 20 reps - Gentle Horizontal Shoulder Pendulum  - 3-5 x daily - 7 x weekly - 1 sets - 20 reps - Gentle Vertical Shoulder Pendulum  - 3-5 x daily - 7 x weekly - 1 sets - 20 reps - Standing Scapular Retraction  - 5 x daily - 7 x weekly - 1 sets - 5 reps -  5 second hold - Supine Scapular Protraction in Flexion with Dumbbells  - 2-3 x daily - 7 x weekly - 1 sets - 10-20 reps - 3 seconds hold - Sidelying Shoulder External Rotation Dumbbell  - 1-2 x daily - 7 x weekly - 2 sets - 10 reps - 1 hold  ASSESSMENT:  CLINICAL IMPRESSION: Janila is making slow but significant weekly progress with her right shoulder strength and function.  She notes doing more things around the house with less difficulty and looks significantly better with her home exercise program as compared to the last time I saw her a month ago.  Gail's current program is focused on scapular and deltoid strength which should allow her to continue to get more functional and meet remaining long-term goals.  Dondra Spry has demonstrated independence with this program and appears ready for transfer into more independent rehabilitation.  I encouraged Dondra Spry to follow-up with her primary therapist Nedra Hai PT, DPT at her next follow-up and if Baxter Hire agrees, Dondra Spry may be ready for transfer into independent rehabilitation.  OBJECTIVE IMPAIRMENTS: decreased ROM, decreased strength, hypomobility, increased edema, increased fascial restrictions, increased muscle spasms, impaired flexibility, impaired UE functional use, postural dysfunction, obesity, and pain.   ACTIVITY LIMITATIONS: carrying, lifting, bed mobility, bathing, toileting, dressing, reach over head, and caring for others  PARTICIPATION LIMITATIONS: cleaning, laundry, driving, shopping, community activity, and yard work  PERSONAL FACTORS: Age, Behavior pattern, Education, Fitness, Past/current experiences, Sex, Social background, and Time since  onset of injury/illness/exacerbation are also affecting patient's functional outcome.   REHAB POTENTIAL: Good  CLINICAL DECISION MAKING: Stable/uncomplicated  EVALUATION COMPLEXITY: Low   GOALS: Goals reviewed with patient? Yes  SHORT TERM GOALS: Target date: 09/02/2022   Will be compliant with appropriate progressive HEP  Baseline: Goal status: Met 08/21/2022  2.  R shoulder flexion and scaption PROM to be at least 100 degrees, R shoulder ER PROM to be at least 20 degrees at 0* ABD  Baseline:  Goal status: Met 08/13/2022  3.  R shoulder flexion and scaption AAROM to be at least 90 degrees, R shoulder ER AAROM to reach neutral  Baseline:  Goal status: MET 08/25/2022   4.  Will demonstrate better functional posture/postural mechanics  Baseline:  Goal status: Met 09/29/2022   LONG TERM GOALS: Target date: 11/19/22    R shoulder flexion and scaption AROM to be at least 120 degrees  Baseline:  Goal status: Partially Met 11/03/2022 (Supine met, sitting/standing no)  2.  R shoulder ER AROM to be at least 30 degrees at 45* ABD  Baseline:  Goal status: Met 09/10/2022  3.  Will be able to reach L5 level with active FIR no increased pain  Baseline:  Goal status: Met 11/03/2022  4.  Will be able to perform all dressing/bathing tasks on a Mod(I) basis no increase in pain  Baseline:  Goal status: Met 09/25/2022  5.  Will be able to reach overhead to put dishes in the cabinet without increase in pain  Baseline:  Goal status: Partially met 11/03/2022   PLAN:  PT FREQUENCY: 1 additional visit  PT DURATION: 1 follow-up after her visit with Dr. Steward Drone  PLANNED INTERVENTIONS: Therapeutic exercises, Therapeutic activity, Neuromuscular re-education, Balance training, Gait training, Patient/Family education, Self Care, Joint mobilization, Aquatic Therapy, Dry Needling, Electrical stimulation, Cryotherapy, Moist heat, Taping, Ultrasound, Ionotophoresis 4mg /ml Dexamethasone, Manual therapy, and  Re-evaluation  PLAN FOR NEXT SESSION: Discharge?  NEXT MD VISIT: 11/18/22  Cherlyn Cushing PT, MPT 11/03/22 6:10  PM     PHYSICAL THERAPY DISCHARGE SUMMARY  Visits from Start of Care: 32  Current functional level related to goals / functional outcomes: See note   Remaining deficits: See note   Education / Equipment: HEP  Patient goals were partially met. Patient is being discharged due to not returning since the last visit.   Chyrel Masson, PT, DPT, OCS, ATC 12/10/22  2:11 PM

## 2022-11-06 ENCOUNTER — Encounter: Payer: Medicare HMO | Admitting: Physical Therapy

## 2022-11-10 ENCOUNTER — Encounter: Payer: Medicare HMO | Admitting: Physical Therapy

## 2022-11-11 ENCOUNTER — Ambulatory Visit (HOSPITAL_BASED_OUTPATIENT_CLINIC_OR_DEPARTMENT_OTHER): Payer: Medicare HMO | Admitting: Orthopaedic Surgery

## 2022-11-12 ENCOUNTER — Encounter: Payer: Medicare HMO | Admitting: Physical Therapy

## 2022-11-17 ENCOUNTER — Encounter: Payer: Medicare HMO | Admitting: Physical Therapy

## 2022-11-18 ENCOUNTER — Ambulatory Visit (HOSPITAL_BASED_OUTPATIENT_CLINIC_OR_DEPARTMENT_OTHER): Payer: Medicare HMO

## 2022-11-18 ENCOUNTER — Ambulatory Visit (HOSPITAL_BASED_OUTPATIENT_CLINIC_OR_DEPARTMENT_OTHER): Payer: Medicare HMO | Admitting: Orthopaedic Surgery

## 2022-11-18 DIAGNOSIS — Z471 Aftercare following joint replacement surgery: Secondary | ICD-10-CM | POA: Diagnosis not present

## 2022-11-18 DIAGNOSIS — Z96611 Presence of right artificial shoulder joint: Secondary | ICD-10-CM

## 2022-11-18 NOTE — Progress Notes (Signed)
Post Operative Evaluation    Procedure/Date of Surgery: Right shoulder reverse shoulder arthroplasty July 12, 2022  Interval History:    Presents today for follow-up of her right shoulder.  He is continuing to improve slowly with overhead motion although this is coming somewhat slowly.  She does occasionally have pain when reaching for objects although this is minimal PMH/PSH/Family History/Social History/Meds/Allergies:    Past Medical History:  Diagnosis Date   Adenomatous colon polyp    hyperplastic   Anemia    Anginal pain (HCC)    not recent   Arthritis    NECK, KNEES, FINGERS, TOES   Atrial tachycardia CARDIOLOGIST - DR Graciela Husbands (LAST VISIT AUG 2012)   Echo 12/11: EF 55-60%, Mild LVH, grade 1 diast dysfxn;   holter 1/12: ATach   Atypical chest pain    a. 07/2004 Cath: Clean cors;  b. 04/2010 Myoview: EF 63%, no ischemia   Blood transfusion without reported diagnosis 1969   CHF (congestive heart failure) (HCC)    Chronic kidney disease    left kidney small    Coronary artery disease    Diabetes mellitus, type 2 (HCC)    ORAL AND INSULIN MEDS (LAST A1C  7.3)   Diverticulosis    Erythema CURRENT-- CLOSED ABD. WALL ABSCESS   PER PCP NOTE (03-31-11)- MRSA-- TAKES DOXYCYCLINE   GERD (gastroesophageal reflux disease)    CONTROLLED W/ PREVACID   History of kidney stones 2012   Hyperlipidemia    Hypertension    Insomnia    TAKES MEDS PRN   Neuropathy, peripheral    Obesity (BMI 30-39.9) 02/19/2015   Pneumonia    as child   Post op infection 11/07/2012   left bunionectomy   Restless leg syndrome    Sepsis, urinary HISTORY - 2004   Sleep apnea    no cpap    Thyroid disease    TIA (transient ischemic attack)    "mini strokes" 2023   Tremor    Vitamin D deficiency    Past Surgical History:  Procedure Laterality Date   ABDOMINAL HYSTERECTOMY  1995   ovaries remain   BUNIONECTOMY Left 10/24/2012   BUNIONECTOMY Right 05/17/2017    CARDIAC CATHETERIZATION  07/10/2004   CARPAL TUNNEL RELEASE  2000   RIGHT   CATARACT EXTRACTION, BILATERAL     CERVICAL FUSION  10/12/2007   C5 - 7   COLONOSCOPY     CYSTOSCOPY WITH RETROGRADE PYELOGRAM, URETEROSCOPY AND STENT PLACEMENT Left 11/28/2012   Procedure: LEFT RETROGRADE PYELOGRAM, LEFT URETEROSCOPY, ;  Surgeon: Garnett Farm, MD;  Location: [facility];  Service: Urology;  Laterality: Left;   CYSTOSCOPY/RETROGRADE/URETEROSCOPY/STONE EXTRACTION WITH BASKET  X2 2004 & 2009   LEFT    GASTRIC BYPASS  1981   KNEE ARTHROSCOPY  12/2010   LEFT   LAPAROSCOPIC CHOLECYSTECTOMY  2001   LEFT HEART CATH AND CORONARY ANGIOGRAPHY N/A 12/03/2021   Procedure: LEFT HEART CATH AND CORONARY ANGIOGRAPHY;  Surgeon: Corky Crafts, MD;  Location: [facility];  Service: Cardiovascular;  Laterality: N/A;   left thumb joint surgery  2013   PERCUTANEOUS NEPHROSTOLITHOTOMY  02/27/2011   LEFT   POLYPECTOMY     REVERSE SHOULDER ARTHROPLASTY Right 07/12/2022   Procedure: REVERSE SHOULDER ARTHROPLASTY;  Surgeon: Huel Cote, MD;  Location: [facility];  Service:  Orthopedics;  Laterality: Right;   RIGHT THUMB JOINT SURG.  09/2009   TRIGGER FINGER RELEASE  2010   RIGHT THUMB   UPPER GASTROINTESTINAL ENDOSCOPY     URETERAL STENT PLACEMENT  X2  2004  &  2009   LEFT   URETEROSCOPY  04/06/2011   Procedure: URETEROSCOPY;  Surgeon: Garnett Farm, MD;  Location: [facility];  Service: Urology;  Laterality: Left;  L Ureteroscopy Laser Litho & Stent    Social History   Socioeconomic History   Marital status: Married    Spouse name: Not on file   Number of children: 2   Years of education: 12   Highest education level: 12th grade  Occupational History   Occupation: Educational psychologist middle school    Employer: [employer]    Comment: in office   Occupation: Retired  Tobacco Use   Smoking status: Every Day    Current packs/day: 2.00    Average packs/day: 2.0 packs/day for 43.0 years  (86.0 ttl pk-yrs)    Types: Cigarettes   Smokeless tobacco: Never   Tobacco comments:    1/2 ppd 12/29/21  Vaping Use   Vaping status: Never Used  Substance and Sexual Activity   Alcohol use: No    Alcohol/week: 0.0 standard drinks of alcohol   Drug use: No   Sexual activity: Not on file  Other Topics Concern   Not on file  Social History Narrative   2 children, 2 stepchildren   Lives with husband.   Right-handed.   No daily caffeine.   Social Determinants of Health   Financial Resource Strain: Low Risk  (08/15/2022)   Overall Financial Resource Strain (CARDIA)    Difficulty of Paying Living Expenses: Not hard at all  Food Insecurity: No Food Insecurity (08/15/2022)   Hunger Vital Sign    Worried About Running Out of Food in the Last Year: Never true    Ran Out of Food in the Last Year: Never true  Transportation Needs: No Transportation Needs (08/15/2022)   PRAPARE - Administrator, Civil Service (Medical): No    Lack of Transportation (Non-Medical): No  Physical Activity: Insufficiently Active (08/15/2022)   Exercise Vital Sign    Days of Exercise per Week: 1 day    Minutes of Exercise per Session: 30 min  Stress: No Stress Concern Present (08/15/2022)   [employer] of Occupational Health - Occupational Stress Questionnaire    Feeling of Stress : Not at all  Social Connections: Socially Integrated (08/15/2022)   Social Connection and Isolation Panel [NHANES]    Frequency of Communication with Friends and Family: More than three times a week    Frequency of Social Gatherings with Friends and Family: Patient declined    Attends Religious Services: More than 4 times per year    Active Member of [employer] or Organizations: Yes    Attends Banker Meetings: 1 to 4 times per year    Marital Status: Married   Family History  Problem Relation Age of Onset   Hyperlipidemia Mother    Hypertension Mother    Heart attack Father    Lung disease Father     Heart disease Sister    Hypertension Sister    Colon polyps Sister    Hypertension Sister    Colon polyps Sister    Hypertension Sister    Hypertension Sister    Lung cancer Sister 23       stage 4  Diabetes Other    Breast cancer Other    Heart disease Other    Colon cancer Other 35       nephew   Esophageal cancer Neg Hx    Rectal cancer Neg Hx    Stomach cancer Neg Hx    Amblyopia Neg Hx    Blindness Neg Hx    Cataracts Neg Hx    Glaucoma Neg Hx    Retinal detachment Neg Hx    Strabismus Neg Hx    Retinitis pigmentosa Neg Hx    No Known Allergies Current Outpatient Medications  Medication Sig Dispense Refill   acetaminophen (TYLENOL) 500 MG tablet Take 2 tablets (1,000 mg total) by mouth 3 (three) times daily. 30 tablet 0   albuterol (VENTOLIN HFA) 108 (90 Base) MCG/ACT inhaler TAKE 2 PUFFS BY MOUTH EVERY 6 HOURS AS NEEDED FOR WHEEZE OR SHORTNESS OF BREATH (Patient taking differently: Inhale 2 puffs into the lungs every 6 (six) hours as needed for wheezing or shortness of breath.) 18 each 6   Alcohol Swabs (DROPSAFE ALCOHOL PREP) 70 % PADS USE AS DIRECTED  AS NEEDED. 200 each 3   aspirin EC 81 MG tablet Take 1 tablet (81 mg total) by mouth daily. 30 tablet 11   atorvastatin (LIPITOR) 20 MG tablet TAKE 1 TABLET BY MOUTH EVERY DAY 90 tablet 1   Blood Glucose Monitoring Suppl (TRUE METRIX METER) w/Device KIT Use as directed 3-4 times daily.  Dx- type 2 diabetes w/ use of insulin 1 kit 1   calcium-vitamin D (OSCAL WITH D) 500-5 MG-MCG tablet Take 1 tablet by mouth.     cephALEXin (KEFLEX) 500 MG capsule Take 500 mg by mouth daily at 12 noon.     cholecalciferol (VITAMIN D) 25 MCG (1000 UNIT) tablet Take 1,000 Units by mouth daily.     Cholecalciferol (VITAMIN D-3) 25 MCG (1000 UT) CAPS Take 1,000 Units by mouth daily.     clonazePAM (KLONOPIN) 1 MG tablet TAKE 1 TABLET AT BEDTIME FOR RESTLESS LEGS SYNDROME 30 tablet 3   Continuous Blood Gluc Receiver (DEXCOM G6 RECEIVER) DEVI  Use as directed w/ G6 sensor 1 each 0   Continuous Blood Gluc Sensor (DEXCOM G6 SENSOR) MISC Apply sensor every 10 days for continuous blood sugar readings 3 each 3   Continuous Blood Gluc Transmit (DEXCOM G6 TRANSMITTER) MISC Use as directed w/ G6 sensor 1 each 0   diclofenac Sodium (VOLTAREN) 1 % GEL APPLY 2 GRAMS TO AFFECTED AREA 4 TIMES A DAY (Patient taking differently: Apply 2 g topically 4 (four) times daily as needed (for pain- hands and knees).) 300 g 1   fenofibrate 160 MG tablet Take 1 tablet (160 mg total) by mouth daily. 90 tablet 10   furosemide (LASIX) 20 MG tablet TAKE 1 TABLET EVERY DAY 90 tablet 1   gabapentin (NEURONTIN) 300 MG capsule Take 3 capsules (900 mg total) by mouth 3 (three) times daily. 270 capsule 11   glucose blood test strip Use as instructed 100 each 12   insulin aspart (NOVOLOG FLEXPEN) 100 UNIT/ML FlexPen INJECT 25 UNITS SUBCUTANEOUSLY THREE TIMES DAILY WITH MEALS (Patient taking differently: Inject 25 Units into the skin 3 (three) times daily with meals.) 75 mL 0   insulin glargine (LANTUS SOLOSTAR) 100 UNIT/ML Solostar Pen INJECT 60 UNITS UNDER THE SKIN AT BEDTIME (Patient taking differently: Inject 60 Units into the skin at bedtime.) 60 mL 10   Insulin Pen Needle (DROPLET PEN NEEDLES) 31G X 8 MM  MISC USE 4 TIMES DAILY 400 each 6   Lancets Super Thin 28G MISC Please use as directed to test sugars 4 times daily. Dx. E11.9 420 each 3   lansoprazole (PREVACID) 15 MG capsule Take 15 mg by mouth daily before breakfast.     levothyroxine (SYNTHROID) 50 MCG tablet TAKE 1 TABLET BY MOUTH EVERY DAY BEFORE BREAKFAST (Patient taking differently: Take 50 mcg by mouth daily before breakfast.) 90 tablet 1   loperamide (IMODIUM A-D) 2 MG tablet Take 2 mg by mouth 4 (four) times daily as needed for diarrhea or loose stools.     OneTouch UltraSoft 2 Lancets MISC 1 Device by Does not apply route 4 (four) times daily. 100 each 3   Oxycodone HCl 10 MG TABS Take 1 tablet (10 mg  total) by mouth every 6 (six) hours as needed. 28 tablet 0   primidone (MYSOLINE) 50 MG tablet Take 2 tablets (100 mg total) by mouth 2 (two) times daily. 360 tablet 1   sertraline (ZOLOFT) 50 MG tablet Take 1 tablet (50 mg total) by mouth daily. 90 tablet 3   traZODone (DESYREL) 100 MG tablet Take 1 tablet (100 mg total) by mouth at bedtime. 90 tablet 3   valsartan (DIOVAN) 320 MG tablet Take 1 tablet (320 mg total) by mouth daily. 90 tablet 2   verapamil (CALAN-SR) 120 MG CR tablet TAKE 1 TABLET BY MOUTH EVERYDAY AT BEDTIME (Patient taking differently: Take 120 mg by mouth at bedtime.) 90 tablet 1   Vibegron (GEMTESA) 75 MG TABS Take 1 tablet by mouth daily. (Patient not taking: Reported on 10/23/2022)     No current facility-administered medications for this visit.   No results found.  Review of Systems:   A ROS was performed including pertinent positives and negatives as documented in the HPI.   Musculoskeletal Exam:     Right shoulder incision is healed.  Stable to flex and extend at the right elbow.  Passive motion is to 70 degrees in the supine position she fires all 3 heads of the deltoid.  Sensation is intact in axillary distribution.  Fires EPL as well as wrist extensors.  2+ radial pulse  Imaging:    AP right shoulder: Status post reverse shoulder arthroplasty without evidence of complication  I personally reviewed and interpreted the radiographs.   Assessment:   72 year old female is status post right shoulder reverse shoulder arthroplasty for fracture.  Her range of motion is improving slowly.  Her strength is also increasing.  I would like to see her back in 12 weeks for reassessment Plan :    -Return to clinic 12 weeks for reassessment      I personally saw and evaluated the patient, and participated in the management and treatment plan.  Huel Cote, MD Attending Physician, Orthopedic Surgery  This document was dictated using Dragon voice recognition  software. A reasonable attempt at proof reading has been made to minimize errors.

## 2022-11-19 ENCOUNTER — Encounter: Payer: Medicare HMO | Admitting: Physical Therapy

## 2022-11-25 ENCOUNTER — Other Ambulatory Visit: Payer: Self-pay

## 2022-11-25 DIAGNOSIS — E113391 Type 2 diabetes mellitus with moderate nonproliferative diabetic retinopathy without macular edema, right eye: Secondary | ICD-10-CM

## 2022-11-25 MED ORDER — GLUCOSE BLOOD VI STRP
ORAL_STRIP | 12 refills | Status: DC
Start: 2022-11-25 — End: 2023-05-03

## 2022-11-29 ENCOUNTER — Other Ambulatory Visit: Payer: Self-pay | Admitting: Family Medicine

## 2022-12-09 DIAGNOSIS — N3941 Urge incontinence: Secondary | ICD-10-CM | POA: Diagnosis not present

## 2022-12-09 DIAGNOSIS — N3020 Other chronic cystitis without hematuria: Secondary | ICD-10-CM | POA: Diagnosis not present

## 2022-12-16 ENCOUNTER — Other Ambulatory Visit: Payer: Self-pay | Admitting: Family Medicine

## 2022-12-17 ENCOUNTER — Encounter (INDEPENDENT_AMBULATORY_CARE_PROVIDER_SITE_OTHER): Payer: Self-pay

## 2023-01-01 ENCOUNTER — Other Ambulatory Visit: Payer: Self-pay | Admitting: Family Medicine

## 2023-01-10 NOTE — Progress Notes (Unsigned)
Cardiology Office Note    Patient Name: Lacey [name] Date of Encounter: 01/11/2023  Primary Care Provider:  Sheliah Hatch, MD Primary Cardiologist:  Lance Muss, MD Primary Electrophysiologist: Sherryl Manges, MD   Past Medical History    Past Medical History:  Diagnosis Date   Adenomatous colon polyp    hyperplastic   Anemia    Anginal pain (HCC)    not recent   Arthritis    NECK, KNEES, FINGERS, TOES   Atrial tachycardia CARDIOLOGIST - DR Graciela Husbands (LAST VISIT AUG 2012)   Echo 12/11: EF 55-60%, Mild LVH, grade 1 diast dysfxn;   holter 1/12: ATach   Atypical chest pain    a. 07/2004 Cath: Clean cors;  b. 04/2010 Myoview: EF 63%, no ischemia   Blood transfusion without reported diagnosis 1969   CHF (congestive heart failure) (HCC)    Chronic kidney disease    left kidney small    Coronary artery disease    Diabetes mellitus, type 2 (HCC)    ORAL AND INSULIN MEDS (LAST A1C  7.3)   Diverticulosis    Erythema CURRENT-- CLOSED ABD. WALL ABSCESS   PER PCP NOTE (03-31-11)- MRSA-- TAKES DOXYCYCLINE   GERD (gastroesophageal reflux disease)    CONTROLLED W/ PREVACID   History of kidney stones 2012   Hyperlipidemia    Hypertension    Insomnia    TAKES MEDS PRN   Neuropathy, peripheral    Obesity (BMI 30-39.9) 02/19/2015   Pneumonia    as child   Post op infection 11/07/2012   left bunionectomy   Restless leg syndrome    Sepsis, urinary HISTORY - 2004   Sleep apnea    no cpap    Thyroid disease    TIA (transient ischemic attack)    "mini strokes" 2023   Tremor    Vitamin D deficiency     History of Present Illness  Lacey [name] is a 72 y.o. female with a PMH of HFpEF, mild nonobstructive CAD, DM type II, HLD, OSA (not on CPAP) HTN, TIA (2023) atrial tachycardia who presents today for 1 year follow-up.  Lacey [name] was seen initially and Dr. Dickie La and is currently being followed by Dr. Eldridge Dace.  She was seen in 2011 for complaint of palpitations and underwent  Myoview scan that was negative and echo which was also completed showing diastolic dysfunction she wore a Holter monitor that was significant for tachycardia.  She is currently followed by Dr. Graciela Husbands managed with verapamil for rate control.  She was seen initially by Dr. Eldridge Dace on 07/03/2021 of chest pain and underwent coronary CTA that showed a high calcium score recommendation to continue statin therapy.  She and ETT was positive for ischemia recommendations for LHC that was performed on 12/03/2021 and showed mild nonobstructive CAD with 25% stenosis mild circumflex aortic valve stenosis.  During today's visit the patient reports that she is doing well with no new cardiac complaints since her previous follow-up.  Her blood pressure today is elevated at 158/54 and was 148/62 on recheck.  She is compliant with her current medications and denies any adverse reactions.  She unfortunately suffered a fractured shoulder in March after slipping outside during the rain.  She underwent a total shoulder arthroplasty.  She reports having a relapse with her cigarette use and is now smoking 1 pack every 2 days due to her incident.  During today's visit we discussed the importance of abstaining from excess tobacco abuse and she is willing to discuss  methods with her PCP.  She was previously treated with Wellbutrin but had a side effect and did not tolerate it..  Patient denies chest pain, palpitations, dyspnea, PND, orthopnea, nausea, vomiting, dizziness, syncope, edema, weight gain, or early satiety.  Review of Systems  Please see the history of present illness.    All other systems reviewed and are otherwise negative except as noted above.  Physical Exam    Wt Readings from Last 3 Encounters:  01/11/23 206 lb 3.2 oz (93.5 kg)  10/23/22 207 lb 12.8 oz (94.3 kg)  10/01/22 211 lb 6 oz (95.9 kg)   VS: Vitals:   01/11/23 0933 01/11/23 1001  BP: (!) 158/54 (!) 148/62  Pulse: (!) 55   SpO2: 98%   ,Body mass index is  37.71 kg/m. GEN: Well nourished, well developed in no acute distress Neck: No JVD; No carotid bruits Pulmonary: Clear to auscultation without rales, wheezing or rhonchi  Cardiovascular: Normal rate. Regular rhythm. Normal S1. Normal S2.   Murmurs: There is no murmur.  ABDOMEN: Soft, non-tender, non-distended EXTREMITIES:  No edema; No deformity   EKG/LABS/ Recent Cardiac Studies   ECG personally reviewed by me today -none completed today  Risk Assessment/Calculations:          Lab Results  Component Value Date   WBC 8.1 10/01/2022   HGB 13.4 10/01/2022   HCT 41.6 10/01/2022   MCV 93.8 10/01/2022   PLT 152.0 10/01/2022   Lab Results  Component Value Date   CREATININE 0.76 10/01/2022   BUN 11 10/01/2022   NA 137 10/01/2022   K 3.6 10/01/2022   CL 102 10/01/2022   CO2 26 10/01/2022   Lab Results  Component Value Date   CHOL 153 10/01/2022   HDL 43.50 10/01/2022   LDLCALC 84 10/01/2022   LDLDIRECT 86.0 05/02/2015   TRIG 124.0 10/01/2022   CHOLHDL 4 10/01/2022    Lab Results  Component Value Date   HGBA1C 7.0 (H) 10/01/2022   Assessment & Plan    1.  Nonobstructive CAD: -s/p LHC performed 12/2021 with mild 25% stenosis in circumflex -Patient reports she is doing well with no new cardiac complaints today. -Continue GDMT with ASA 81 mg, Lipitor 20 mg, fenofibrate 160 mg verapamil 120 mg daily, valsartan 320 mg daily  2.  HFpEF: -2D echo completed 05/2021 EF of 60 to 65% and no RWMA with mild LVH and grade 2 DD with trivial MVR -Patient is euvolemic on examination today and denies any shortness of breath. -Continue Lasix 20 mg daily -Low sodium diet, fluid restriction <2L, and daily weights encouraged. Educated to contact our office for weight gain of 2 lbs overnight or 5 lbs in one week.   3.  Atrial tachycardia: -He is currently followed by EP and managed with verapamil 120 mg -Patient reports no breakthrough palpitations or tachycardia since previous  follow-up.  4.  Essential hypertension: -HYPERTENSION CONTROL Vitals:   01/11/23 0933 01/11/23 1001  BP: (!) 158/54 (!) 148/62    The patient's blood pressure is elevated above target today.  In order to address the patient's elevated BP: Blood pressure will be monitored at home to determine if medication changes need to be made.     -Continue Diovan 320 mg daily and verapamil 120 mg daily  5.  Mixed hyperlipidemia: -Patient is LDL cholesterol was 84 -Continue fenofibrate 160 mg daily and Lipitor 20 mg daily  Disposition: Follow-up with Lance Muss, MD or APP in 6 months  Signed, Napoleon Form, Leodis Rains, NP 01/11/2023, 12:19 PM [facility]

## 2023-01-11 ENCOUNTER — Ambulatory Visit: Payer: Medicare HMO | Attending: Nurse Practitioner | Admitting: Nurse Practitioner

## 2023-01-11 ENCOUNTER — Encounter: Payer: Self-pay | Admitting: Nurse Practitioner

## 2023-01-11 VITALS — BP 148/62 | HR 55 | Ht 62.0 in | Wt 206.2 lb

## 2023-01-11 DIAGNOSIS — I5032 Chronic diastolic (congestive) heart failure: Secondary | ICD-10-CM | POA: Diagnosis not present

## 2023-01-11 DIAGNOSIS — Z794 Long term (current) use of insulin: Secondary | ICD-10-CM

## 2023-01-11 DIAGNOSIS — E1159 Type 2 diabetes mellitus with other circulatory complications: Secondary | ICD-10-CM | POA: Diagnosis not present

## 2023-01-11 DIAGNOSIS — I4719 Other supraventricular tachycardia: Secondary | ICD-10-CM

## 2023-01-11 DIAGNOSIS — I2510 Atherosclerotic heart disease of native coronary artery without angina pectoris: Secondary | ICD-10-CM

## 2023-01-11 DIAGNOSIS — I10 Essential (primary) hypertension: Secondary | ICD-10-CM

## 2023-01-11 DIAGNOSIS — E782 Mixed hyperlipidemia: Secondary | ICD-10-CM | POA: Diagnosis not present

## 2023-01-11 NOTE — Patient Instructions (Signed)
Medication Instructions:  Your physician recommends that you continue on your current medications as directed. Please refer to the Current Medication list given to you today.  *If you need a refill on your cardiac medications before your next appointment, please call your pharmacy*   Lab Work: None ordered   Testing/Procedures: None ordered   Follow-Up: At [facility], you and your health needs are our priority.  As part of our continuing mission to provide you with exceptional heart care, we have created designated Provider Care Teams.  These Care Teams include your primary Cardiologist (physician) and Advanced Practice Providers (APPs -  Physician Assistants and Nurse Practitioners) who all work together to provide you with the care you need, when you need it.  We recommend signing up for the patient portal called "MyChart".  Sign up information is provided on this After Visit Summary.  MyChart is used to connect with patients for Virtual Visits (Telemedicine).  Patients are able to view lab/test results, encounter notes, upcoming appointments, etc.  Non-urgent messages can be sent to your provider as well.   To learn more about what you can do with MyChart, go to [email or web address].    Your next appointment:   6 month(s)  Provider:   Lennie Odor, MD Weston Brass, MD  Other Instructions  Check your blood pressure daily for 2 weeks, then contact the office with your readings.  Make sure to check 2 hours after your medications.   AVOID these things for 30 minutes before checking your blood pressure: No Drinking caffeine. No Drinking alcohol. No Eating. No Smoking. No Exercising.  Five minutes before checking your blood pressure: Pee. Sit in a dining chair. Avoid sitting in a soft couch or armchair. Be quiet. Do not talk.

## 2023-01-14 ENCOUNTER — Ambulatory Visit: Payer: Medicare HMO | Admitting: *Deleted

## 2023-01-14 DIAGNOSIS — Z0000 Encounter for general adult medical examination without abnormal findings: Secondary | ICD-10-CM

## 2023-01-14 NOTE — Progress Notes (Signed)
Subjective:   Lacey [name] is a 72 y.o. female who presents for Medicare Annual (Subsequent) preventive examination.  Visit Complete: Virtual  I connected with  [name] on 01/14/23 by a audio enabled telemedicine application and verified that I am speaking with the correct person using two identifiers.  Patient Location: Home  Provider Location: Home Office  I discussed the limitations of evaluation and management by telemedicine. The patient expressed understanding and agreed to proceed.  Patient Medicare AWV questionnaire was completed by the patient on 01-13-2023; I have confirmed that all information answered by patient is correct and no changes since this date.  Review of Systems     Cardiac Risk Factors include: advanced age (>41men, >1 women);diabetes mellitus;hypertension;family history of premature cardiovascular disease;obesity (BMI >30kg/m2)     Objective:    There were no vitals filed for this visit. There is no height or weight on file to calculate BMI.     01/14/2023   10:37 AM 10/23/2022    8:14 AM 08/05/2022   10:16 AM 07/11/2022    7:26 AM 01/08/2022   10:35 AM 12/03/2021    8:24 AM 10/21/2021   11:04 AM  Advanced Directives  Does Patient Have a Medical Advance Directive? Yes No Yes No No Yes Yes  Type of Advance Directive Healthcare Power of Attorney  Living will   Living will;Healthcare Power of Attorney Living will  Does patient want to make changes to medical advance directive?   No - Patient declined      Copy of Healthcare Power of Attorney in Chart? No - copy requested     No - copy requested   Would patient like information on creating a medical advance directive?     No - Patient declined      Current Medications (verified) Outpatient Encounter Medications as of 01/14/2023  Medication Sig   albuterol (VENTOLIN HFA) 108 (90 Base) MCG/ACT inhaler TAKE 2 PUFFS BY MOUTH EVERY 6 HOURS AS NEEDED FOR WHEEZE OR SHORTNESS OF BREATH (Patient taking differently:  Inhale 2 puffs into the lungs every 6 (six) hours as needed for wheezing or shortness of breath.)   Alcohol Swabs (DROPSAFE ALCOHOL PREP) 70 % PADS USE AS DIRECTED  AS NEEDED.   aspirin EC 81 MG tablet Take 1 tablet (81 mg total) by mouth daily.   atorvastatin (LIPITOR) 20 MG tablet TAKE 1 TABLET BY MOUTH EVERY DAY   Blood Glucose Monitoring Suppl (TRUE METRIX METER) w/Device KIT Use as directed 3-4 times daily.  Dx- type 2 diabetes w/ use of insulin   calcium-vitamin D (OSCAL WITH D) 500-5 MG-MCG tablet Take 1 tablet by mouth.   cephALEXin (KEFLEX) 500 MG capsule Take 500 mg by mouth daily at 12 noon.   Cholecalciferol (VITAMIN D-3) 25 MCG (1000 UT) CAPS Take 1,000 Units by mouth daily.   clonazePAM (KLONOPIN) 1 MG tablet TAKE 1 TABLET AT BEDTIME FOR RESTLESS LEGS SYNDROME   Continuous Blood Gluc Receiver (DEXCOM G6 RECEIVER) DEVI Use as directed w/ G6 sensor   Continuous Blood Gluc Sensor (DEXCOM G6 SENSOR) MISC Apply sensor every 10 days for continuous blood sugar readings   Continuous Blood Gluc Transmit (DEXCOM G6 TRANSMITTER) MISC Use as directed w/ G6 sensor   diclofenac Sodium (VOLTAREN) 1 % GEL APPLY 2 GRAMS TO AFFECTED AREA 4 TIMES A DAY (Patient taking differently: Apply 2 g topically 4 (four) times daily as needed (for pain- hands and knees).)   fenofibrate 160 MG tablet Take 1 tablet (  160 mg total) by mouth daily.   furosemide (LASIX) 20 MG tablet TAKE 1 TABLET EVERY DAY   gabapentin (NEURONTIN) 300 MG capsule Take 300 mg by mouth 2 (two) times daily.   glucose blood test strip Use as instructed   insulin aspart (NOVOLOG FLEXPEN) 100 UNIT/ML FlexPen INJECT 25 UNITS SUBCUTANEOUSLY THREE TIMES DAILY WITH MEALS   insulin glargine (LANTUS SOLOSTAR) 100 UNIT/ML Solostar Pen INJECT 60 UNITS UNDER THE SKIN AT BEDTIME (Patient taking differently: Inject 60 Units into the skin at bedtime.)   Insulin Pen Needle (DROPLET PEN NEEDLES) 31G X 8 MM MISC USE 4 TIMES DAILY   Lancets Super Thin 28G  MISC Please use as directed to test sugars 4 times daily. Dx. E11.9   lansoprazole (PREVACID) 15 MG capsule Take 15 mg by mouth daily before breakfast.   levothyroxine (SYNTHROID) 50 MCG tablet TAKE 1 TABLET BY MOUTH EVERY DAY BEFORE BREAKFAST   loperamide (IMODIUM A-D) 2 MG tablet Take 2 mg by mouth 4 (four) times daily as needed for diarrhea or loose stools.   OneTouch UltraSoft 2 Lancets MISC 1 Device by Does not apply route 4 (four) times daily.   primidone (MYSOLINE) 50 MG tablet Take 2 tablets (100 mg total) by mouth 2 (two) times daily.   sertraline (ZOLOFT) 50 MG tablet Take 1 tablet (50 mg total) by mouth daily.   traZODone (DESYREL) 100 MG tablet Take 1 tablet (100 mg total) by mouth at bedtime.   valsartan (DIOVAN) 320 MG tablet Take 1 tablet (320 mg total) by mouth daily.   verapamil (CALAN-SR) 120 MG CR tablet TAKE 1 TABLET BY MOUTH EVERYDAY AT BEDTIME   Vibegron (GEMTESA) 75 MG TABS Take 1 tablet by mouth daily.   No facility-administered encounter medications on file as of 01/14/2023.    Allergies (verified) Patient has no known allergies.   History: Past Medical History:  Diagnosis Date   Adenomatous colon polyp    hyperplastic   Anemia    Anginal pain (HCC)    not recent   Arthritis    NECK, KNEES, FINGERS, TOES   Atrial tachycardia CARDIOLOGIST - DR Graciela Husbands (LAST VISIT AUG 2012)   Echo 12/11: EF 55-60%, Mild LVH, grade 1 diast dysfxn;   holter 1/12: ATach   Atypical chest pain    a. 07/2004 Cath: Clean cors;  b. 04/2010 Myoview: EF 63%, no ischemia   Blood transfusion without reported diagnosis 1969   CHF (congestive heart failure) (HCC)    Chronic kidney disease    left kidney small    Coronary artery disease    Diabetes mellitus, type 2 (HCC)    ORAL AND INSULIN MEDS (LAST A1C  7.3)   Diverticulosis    Erythema CURRENT-- CLOSED ABD. WALL ABSCESS   PER PCP NOTE (03-31-11)- MRSA-- TAKES DOXYCYCLINE   GERD (gastroesophageal reflux disease)    CONTROLLED W/  PREVACID   History of kidney stones 2012   Hyperlipidemia    Hypertension    Insomnia    TAKES MEDS PRN   Neuropathy, peripheral    Obesity (BMI 30-39.9) 02/19/2015   Pneumonia    as child   Post op infection 11/07/2012   left bunionectomy   Restless leg syndrome    Sepsis, urinary HISTORY - 2004   Sleep apnea    no cpap    Thyroid disease    TIA (transient ischemic attack)    "mini strokes" 2023   Tremor    Vitamin D deficiency  Past Surgical History:  Procedure Laterality Date   ABDOMINAL HYSTERECTOMY  1995   ovaries remain   BUNIONECTOMY Left 10/24/2012   BUNIONECTOMY Right 05/17/2017   CARDIAC CATHETERIZATION  07/10/2004   CARPAL TUNNEL RELEASE  2000   RIGHT   CATARACT EXTRACTION, BILATERAL     CERVICAL FUSION  10/12/2007   C5 - 7   COLONOSCOPY     CYSTOSCOPY WITH RETROGRADE PYELOGRAM, URETEROSCOPY AND STENT PLACEMENT Left 11/28/2012   Procedure: LEFT RETROGRADE PYELOGRAM, LEFT URETEROSCOPY, ;  Surgeon: Garnett Farm, MD;  Location: [facility];  Service: Urology;  Laterality: Left;   CYSTOSCOPY/RETROGRADE/URETEROSCOPY/STONE EXTRACTION WITH BASKET  X2 2004 & 2009   LEFT    GASTRIC BYPASS  1981   KNEE ARTHROSCOPY  12/2010   LEFT   LAPAROSCOPIC CHOLECYSTECTOMY  2001   LEFT HEART CATH AND CORONARY ANGIOGRAPHY N/A 12/03/2021   Procedure: LEFT HEART CATH AND CORONARY ANGIOGRAPHY;  Surgeon: Corky Crafts, MD;  Location: [facility] INVASIVE CV [facility];  Service: Cardiovascular;  Laterality: N/A;   left thumb joint surgery  2013   PERCUTANEOUS NEPHROSTOLITHOTOMY  02/27/2011   LEFT   POLYPECTOMY     REVERSE SHOULDER ARTHROPLASTY Right 07/12/2022   Procedure: REVERSE SHOULDER ARTHROPLASTY;  Surgeon: Huel Cote, MD;  Location: [facility];  Service: Orthopedics;  Laterality: Right;   RIGHT THUMB JOINT SURG.  09/2009   TRIGGER FINGER RELEASE  2010   RIGHT THUMB   UPPER GASTROINTESTINAL ENDOSCOPY     URETERAL STENT PLACEMENT  X2  2004  &  2009   LEFT   URETEROSCOPY  04/06/2011    Procedure: URETEROSCOPY;  Surgeon: Garnett Farm, MD;  Location: [facility];  Service: Urology;  Laterality: Left;  L Ureteroscopy Laser Litho & Stent    Family History  Problem Relation Age of Onset   Hyperlipidemia Mother    Hypertension Mother    Heart attack Father    Lung disease Father    Heart disease Sister    Hypertension Sister    Colon polyps Sister    Hypertension Sister    Colon polyps Sister    Hypertension Sister    Hypertension Sister    Lung cancer Sister 46       stage 4    Diabetes Other    Breast cancer Other    Heart disease Other    Colon cancer Other 61       nephew   Esophageal cancer Neg Hx    Rectal cancer Neg Hx    Stomach cancer Neg Hx    Amblyopia Neg Hx    Blindness Neg Hx    Cataracts Neg Hx    Glaucoma Neg Hx    Retinal detachment Neg Hx    Strabismus Neg Hx    Retinitis pigmentosa Neg Hx    Social History   Socioeconomic History   Marital status: Married    Spouse name: Not on file   Number of children: 2   Years of education: 12   Highest education level: 12th grade  Occupational History   Occupation: Educational psychologist middle school    Employer: [employer]    Comment: in office   Occupation: Retired  Tobacco Use   Smoking status: Every Day    Current packs/day: 2.00    Average packs/day: 2.0 packs/day for 43.0 years (86.0 ttl pk-yrs)    Types: Cigarettes   Smokeless tobacco: Never   Tobacco comments:    1/2 ppd 12/29/21  Vaping  Use   Vaping status: Never Used  Substance and Sexual Activity   Alcohol use: No    Alcohol/week: 0.0 standard drinks of alcohol   Drug use: No   Sexual activity: Not Currently  Other Topics Concern   Not on file  Social History Narrative   2 children, 2 stepchildren   Lives with husband.   Right-handed.   No daily caffeine.   Social Determinants of Health   Financial Resource Strain: Low Risk  (01/14/2023)   Overall Financial Resource Strain (CARDIA)    Difficulty of  Paying Living Expenses: Not hard at all  Food Insecurity: No Food Insecurity (01/14/2023)   Hunger Vital Sign    Worried About Running Out of Food in the Last Year: Never true    Ran Out of Food in the Last Year: Never true  Transportation Needs: No Transportation Needs (01/14/2023)   PRAPARE - Administrator, Civil Service (Medical): No    Lack of Transportation (Non-Medical): No  Physical Activity: Inactive (01/14/2023)   Exercise Vital Sign    Days of Exercise per Week: 0 days    Minutes of Exercise per Session: 0 min  Stress: No Stress Concern Present (01/14/2023)   [employer] of Occupational Health - Occupational Stress Questionnaire    Feeling of Stress : Not at all  Social Connections: Moderately Isolated (01/14/2023)   Social Connection and Isolation Panel [NHANES]    Frequency of Communication with Friends and Family: Never    Frequency of Social Gatherings with Friends and Family: Never    Attends Religious Services: More than 4 times per year    Active Member of [employer] or Organizations: No    Attends Banker Meetings: Never    Marital Status: Married    Tobacco Counseling Ready to quit: Not Answered Counseling given: Not Answered Tobacco comments: 1/2 ppd 12/29/21   Clinical Intake:  Pre-visit preparation completed: Yes  Pain : No/denies pain     Diabetes: Yes CBG done?: No Did pt. bring in CBG monitor from home?: No  How often do you need to have someone help you when you read instructions, pamphlets, or other written materials from your doctor or pharmacy?: 1 - Never  Interpreter Needed?: No  Information entered by :: Remi Haggard LPN   Activities of Daily Living    01/14/2023   10:37 AM 01/13/2023    5:51 PM  In your present state of health, do you have any difficulty performing the following activities:  Hearing? 0 0  Vision? 0 0  Difficulty concentrating or making decisions? 0 0  Walking or climbing stairs? 1 1  Doing  errands, shopping? 0 0  Preparing Food and eating ? N N  Using the Toilet? N N  In the past six months, have you accidently leaked urine? Y Y  Do you have problems with loss of bowel control? N N  Managing your Medications? N N  Managing your Finances? N N  Housekeeping or managing your Housekeeping? N N    Patient Care Team: Sheliah Hatch, MD as PCP - General (Family Medicine) Corky Crafts, MD as PCP - Cardiology (Cardiology) Duke Salvia, MD as PCP - Electrophysiology (Cardiology) Duke Salvia, MD as Consulting Physician (Cardiology) Lunette Stands, MD as Consulting Physician (Orthopedic Surgery) Elliot Cousin, OD as Consulting Physician (Optometry) Hilarie Fredrickson, MD as Consulting Physician (Gastroenterology) Rene Paci, MD as Consulting Physician (Urology) [place], Amy, MD as Consulting Physician (Dermatology)  Teryl Lucy, MD as Consulting Physician (Orthopedic Surgery) Tat, Octaviano Batty, DO as Consulting Physician (Neurology)  Indicate any recent Medical Services you may have received from other than [facility] providers in the past year (date may be approximate).     Assessment:   This is a routine wellness examination for [place].  Hearing/Vision screen Hearing Screening - Comments:: No trouble hearing Vision Screening - Comments:: Up to date [facility]   Goals Addressed             This Visit's Progress    Weight (lb) < 200 lb (90.7 kg)         Depression Screen    01/14/2023   10:43 AM 10/01/2022   10:30 AM 08/19/2022   10:23 AM 07/22/2022   10:53 AM 05/05/2022   10:33 AM 01/08/2022   10:33 AM 01/01/2022   10:11 AM  PHQ 2/9 Scores  PHQ - 2 Score 0 0 0 6 0 0 0  PHQ- 9 Score 0 0 6 21 0 0 0    Fall Risk    01/14/2023   10:38 AM 01/13/2023    5:51 PM 10/23/2022    8:14 AM 10/01/2022   10:30 AM 08/19/2022   10:23 AM  Fall Risk   Falls in the past year? 1 1 1 1 1   Number falls in past yr: 1 1 0 0 0  Injury with Fall? 1 1 0 1 1  Risk for  fall due to :    History of fall(s);Orthopedic patient History of fall(s);Orthopedic patient  Follow up Falls evaluation completed;Education provided;Falls prevention discussed  Falls evaluation completed Falls evaluation completed Falls evaluation completed    MEDICARE RISK AT HOME: Medicare Risk at Home Any stairs in or around the home?: Yes If so, are there any without handrails?: Yes Home free of loose throw rugs in walkways, pet beds, electrical cords, etc?: Yes Adequate lighting in your home to reduce risk of falls?: Yes Life alert?: No Use of a cane, walker or w/c?: No Grab bars in the bathroom?: Yes Shower chair or bench in shower?: Yes Elevated toilet seat or a handicapped toilet?: No  TIMED UP AND GO:  Was the test performed?  No    Cognitive Function:    06/16/2018   10:28 AM 06/09/2017    1:30 PM  MMSE - Mini Mental State Exam  Orientation to time 5 5  Orientation to Place 5 5  Registration 3 3  Attention/ Calculation 5 5  Recall 3 3  Language- name 2 objects 2 2  Language- repeat 1 1  Language- follow 3 step command 3 3  Language- read & follow direction 1 1  Write a sentence 1 1  Copy design 1 1  Total score 30 30        01/14/2023   10:40 AM 01/08/2022   10:40 AM  6CIT Screen  What Year? 0 points 0 points  What month? 0 points 0 points  What time? 0 points 0 points  Count back from 20 0 points 0 points  Months in reverse 0 points 0 points  Repeat phrase 0 points 0 points  Total Score 0 points 0 points    Immunizations Immunization History  Administered Date(s) Administered   Fluad Quad(high Dose 65+) 01/06/2022   Influenza Split 02/05/2011, 02/26/2012, 01/06/2022   Influenza Whole 02/05/2009, 01/20/2010   Influenza, High Dose Seasonal PF 01/10/2018, 12/23/2018, 01/25/2019, 01/04/2020, 01/08/2021   Influenza,inj,Quad PF,6+ Mos 01/24/2013, 01/15/2014, 01/22/2015, 01/23/2016, 12/29/2016  Influenza-Unspecified 01/02/2021, 01/06/2023   PFIZER(Purple  Top)SARS-COV-2 Vaccination 05/29/2019, 06/19/2019, 01/29/2020   Pfizer Covid-19 Vaccine Bivalent Booster 65yrs & up 01/08/2021   Pneumococcal Conjugate-13 04/17/2014   Pneumococcal Polysaccharide-23 06/05/2007, 02/06/2016   Respiratory Syncytial Virus Vaccine,Recomb Aduvanted(Arexvy) 04/18/2022   Tdap 06/22/2011, 12/23/2018, 01/25/2019   Zoster Recombinant(Shingrix) 07/01/2017, 09/04/2017   Zoster, Live 03/24/2012    TDAP status: Up to date  Flu Vaccine status: Up to date  Pneumococcal vaccine status: Up to date  Covid-19 vaccine status: Information provided on how to obtain vaccines.   Qualifies for Shingles Vaccine? No   Zostavax completed Yes   Shingrix Completed?: Yes  Screening Tests Health Maintenance  Topic Date Due   MAMMOGRAM  12/27/2022   OPHTHALMOLOGY EXAM  12/31/2022   FOOT EXAM  01/02/2023   HEMOGLOBIN A1C  04/03/2023   Diabetic kidney evaluation - eGFR measurement  10/01/2023   Diabetic kidney evaluation - Urine ACR  10/01/2023   Lung Cancer Screening  10/26/2023   Medicare Annual Wellness (AWV)  01/14/2024   Colonoscopy  09/03/2025   DEXA SCAN  01/14/2027   DTaP/Tdap/Td (4 - Td or Tdap) 01/24/2029   Pneumonia Vaccine 29+ Years old  Completed   INFLUENZA VACCINE  Completed   Hepatitis C Screening  Completed   Zoster Vaccines- Shingrix  Completed   HPV VACCINES  Aged Out   COVID-19 Vaccine  Discontinued    Health Maintenance  Health Maintenance Due  Topic Date Due   MAMMOGRAM  12/27/2022   OPHTHALMOLOGY EXAM  12/31/2022   FOOT EXAM  01/02/2023    Colorectal cancer screening: Type of screening: Colonoscopy. Completed 2022. Repeat every 5 years  Mammogram  scheduled 01-19-2023  Bone Density status: Completed 2023. Results reflect: Bone density results: OSTEOPENIA. Repeat every 5 years.  Lung Cancer Screening: (Low Dose CT Chest recommended if Age 53-80 years, 20 pack-year currently smoking OR have quit w/in 15years.) does not qualify.   Lung  Cancer Screening Referral:   Additional Screening:  Hepatitis C Screening: does not qualify; Completed 2017  Vision Screening: Recommended annual ophthalmology exams for early detection of glaucoma and other disorders of the eye. Is the patient up to date with their annual eye exam?  Yes  Who is the provider or what is the name of the office in which the patient attends annual eye exams? [facility] If pt is not established with a provider, would they like to be referred to a provider to establish care? No .   Dental Screening: Recommended annual dental exams for proper oral hygiene  Nutrition Risk Assessment:  Has the patient had any N/V/D within the last 2 months?  No  Does the patient have any non-healing wounds?  No  Has the patient had any unintentional weight loss or weight gain?  No   Diabetes:  Is the patient diabetic?  Yes  If diabetic, was a CBG obtained today?  No  Did the patient bring in their glucometer from home?  No  How often do you monitor your CBG's? 5 times a day.   Financial Strains and Diabetes Management:  Are you having any financial strains with the device, your supplies or your medication? No .  Does the patient want to be seen by Chronic Care Management for management of their diabetes?  No  Would the patient like to be referred to a Nutritionist or for Diabetic Management?  No   Diabetic Exams:  Diabetic Eye Exam: Completed .Pt has been advised about the importance in  completing this exam. A referral has been placed today.  Diabetic Foot Exam: . Pt has been advised about the importance in completing this exam.   Community Resource Referral / Chronic Care Management: CRR required this visit?  No   CCM required this visit?  No     Plan:     I have personally reviewed and noted the following in the patient's chart:   Medical and social history Use of alcohol, tobacco or illicit drugs  Current medications and supplements including opioid  prescriptions. Patient is not currently taking opioid prescriptions. Functional ability and status Nutritional status Physical activity Advanced directives List of other physicians Hospitalizations, surgeries, and ER visits in previous 12 months Vitals Screenings to include cognitive, depression, and falls Referrals and appointments  In addition, I have reviewed and discussed with patient certain preventive protocols, quality metrics, and best practice recommendations. A written personalized care plan for preventive services as well as general preventive health recommendations were provided to patient.     Remi Haggard, LPN   [id number]   After Visit Summary: (MyChart) Due to this being a telephonic visit, the after visit summary with patients personalized plan was offered to patient via MyChart   Nurse Notes:

## 2023-01-14 NOTE — Patient Instructions (Signed)
[name] , Thank you for taking time to come for your Medicare Wellness Visit. I appreciate your ongoing commitment to your health goals. Please review the following plan we discussed and let me know if I can assist you in the future.   Screening recommendations/referrals: Colonoscopy: Education provided Mammogram: scheduled Bone Density: Education provided Recommended yearly ophthalmology/optometry visit for glaucoma screening and checkup Recommended yearly dental visit for hygiene and checkup  Vaccinations: Influenza vaccine: Education provided Pneumococcal vaccine: up to date Tdap vaccine: up to date Shingles vaccine: Education provided    Advanced directives: yes not on file     Preventive Care 65 Years and Older, Female Preventive care refers to lifestyle choices and visits with your health care provider that can promote health and wellness. What does preventive care include? A yearly physical exam. This is also called an annual well check. Dental exams once or twice a year. Routine eye exams. Ask your health care provider how often you should have your eyes checked. Personal lifestyle choices, including: Daily care of your teeth and gums. Regular physical activity. Eating a healthy diet. Avoiding tobacco and drug use. Limiting alcohol use. Practicing safe sex. Taking low-dose aspirin every day. Taking vitamin and mineral supplements as recommended by your health care provider. What happens during an annual well check? The services and screenings done by your health care provider during your annual well check will depend on your age, overall health, lifestyle risk factors, and family history of disease. Counseling  Your health care provider may ask you questions about your: Alcohol use. Tobacco use. Drug use. Emotional well-being. Home and relationship well-being. Sexual activity. Eating habits. History of falls. Memory and ability to understand  (cognition). Work and work Astronomer. Reproductive health. Screening  You may have the following tests or measurements: Height, weight, and BMI. Blood pressure. Lipid and cholesterol levels. These may be checked every 5 years, or more frequently if you are over 72 years old. Skin check. Lung cancer screening. You may have this screening every year starting at age 72 if you have a 30-pack-year history of smoking and currently smoke or have quit within the past 15 years. Fecal occult blood test (FOBT) of the stool. You may have this test every year starting at age 72. Flexible sigmoidoscopy or colonoscopy. You may have a sigmoidoscopy every 5 years or a colonoscopy every 10 years starting at age 72. Hepatitis C blood test. Hepatitis B blood test. Sexually transmitted disease (STD) testing. Diabetes screening. This is done by checking your blood sugar (glucose) after you have not eaten for a while (fasting). You may have this done every 1-3 years. Bone density scan. This is done to screen for osteoporosis. You may have this done starting at age 72. Mammogram. This may be done every 1-2 years. Talk to your health care provider about how often you should have regular mammograms. Talk with your health care provider about your test results, treatment options, and if necessary, the need for more tests. Vaccines  Your health care provider may recommend certain vaccines, such as: Influenza vaccine. This is recommended every year. Tetanus, diphtheria, and acellular pertussis (Tdap, Td) vaccine. You may need a Td booster every 10 years. Zoster vaccine. You may need this after age 27. Pneumococcal 13-valent conjugate (PCV13) vaccine. One dose is recommended after age 72. Pneumococcal polysaccharide (PPSV23) vaccine. One dose is recommended after age 72. Talk to your health care provider about which screenings and vaccines you need and how often you need  them. This information is not intended to  replace advice given to you by your health care provider. Make sure you discuss any questions you have with your health care provider. Document Released: 05/17/2015 Document Revised: 01/08/2016 Document Reviewed: 02/19/2015 Elsevier Interactive Patient Education  2017 [employer].  Fall Prevention in the Home Falls can cause injuries. They can happen to people of all ages. There are many things you can do to make your home safe and to help prevent falls. What can I do on the outside of my home? Regularly fix the edges of walkways and driveways and fix any cracks. Remove anything that might make you trip as you walk through a door, such as a raised step or threshold. Trim any bushes or trees on the path to your home. Use bright outdoor lighting. Clear any walking paths of anything that might make someone trip, such as rocks or tools. Regularly check to see if handrails are loose or broken. Make sure that both sides of any steps have handrails. Any raised decks and porches should have guardrails on the edges. Have any leaves, snow, or ice cleared regularly. Use sand or salt on walking paths during winter. Clean up any spills in your garage right away. This includes oil or grease spills. What can I do in the bathroom? Use night lights. Install grab bars by the toilet and in the tub and shower. Do not use towel bars as grab bars. Use non-skid mats or decals in the tub or shower. If you need to sit down in the shower, use a plastic, non-slip stool. Keep the floor dry. Clean up any water that spills on the floor as soon as it happens. Remove soap buildup in the tub or shower regularly. Attach bath mats securely with double-sided non-slip rug tape. Do not have throw rugs and other things on the floor that can make you trip. What can I do in the bedroom? Use night lights. Make sure that you have a light by your bed that is easy to reach. Do not use any sheets or blankets that are too big for  your bed. They should not hang down onto the floor. Have a firm chair that has side arms. You can use this for support while you get dressed. Do not have throw rugs and other things on the floor that can make you trip. What can I do in the kitchen? Clean up any spills right away. Avoid walking on wet floors. Keep items that you use a lot in easy-to-reach places. If you need to reach something above you, use a strong step stool that has a grab bar. Keep electrical cords out of the way. Do not use floor polish or wax that makes floors slippery. If you must use wax, use non-skid floor wax. Do not have throw rugs and other things on the floor that can make you trip. What can I do with my stairs? Do not leave any items on the stairs. Make sure that there are handrails on both sides of the stairs and use them. Fix handrails that are broken or loose. Make sure that handrails are as long as the stairways. Check any carpeting to make sure that it is firmly attached to the stairs. Fix any carpet that is loose or worn. Avoid having throw rugs at the top or bottom of the stairs. If you do have throw rugs, attach them to the floor with carpet tape. Make sure that you have a light switch at  the top of the stairs and the bottom of the stairs. If you do not have them, ask someone to add them for you. What else can I do to help prevent falls? Wear shoes that: Do not have high heels. Have rubber bottoms. Are comfortable and fit you well. Are closed at the toe. Do not wear sandals. If you use a stepladder: Make sure that it is fully opened. Do not climb a closed stepladder. Make sure that both sides of the stepladder are locked into place. Ask someone to hold it for you, if possible. Clearly mark and make sure that you can see: Any grab bars or handrails. First and last steps. Where the edge of each step is. Use tools that help you move around (mobility aids) if they are needed. These  include: Canes. Walkers. Scooters. Crutches. Turn on the lights when you go into a dark area. Replace any light bulbs as soon as they burn out. Set up your furniture so you have a clear path. Avoid moving your furniture around. If any of your floors are uneven, fix them. If there are any pets around you, be aware of where they are. Review your medicines with your doctor. Some medicines can make you feel dizzy. This can increase your chance of falling. Ask your doctor what other things that you can do to help prevent falls. This information is not intended to replace advice given to you by your health care provider. Make sure you discuss any questions you have with your health care provider. Document Released: 02/14/2009 Document Revised: 09/26/2015 Document Reviewed: 05/25/2014 Elsevier Interactive Patient Education  2017 [employer].

## 2023-01-19 ENCOUNTER — Encounter: Payer: Self-pay | Admitting: Family Medicine

## 2023-01-19 DIAGNOSIS — Z1231 Encounter for screening mammogram for malignant neoplasm of breast: Secondary | ICD-10-CM | POA: Diagnosis not present

## 2023-01-19 LAB — HM MAMMOGRAPHY

## 2023-01-27 DIAGNOSIS — E119 Type 2 diabetes mellitus without complications: Secondary | ICD-10-CM | POA: Diagnosis not present

## 2023-01-27 DIAGNOSIS — H524 Presbyopia: Secondary | ICD-10-CM | POA: Diagnosis not present

## 2023-01-28 ENCOUNTER — Ambulatory Visit: Payer: Medicare HMO | Admitting: Family Medicine

## 2023-01-28 ENCOUNTER — Encounter: Payer: Self-pay | Admitting: Family Medicine

## 2023-01-28 VITALS — BP 140/68 | HR 62 | Temp 98.0°F | Ht 62.0 in | Wt 206.0 lb

## 2023-01-28 DIAGNOSIS — Z0000 Encounter for general adult medical examination without abnormal findings: Secondary | ICD-10-CM

## 2023-01-28 DIAGNOSIS — E113391 Type 2 diabetes mellitus with moderate nonproliferative diabetic retinopathy without macular edema, right eye: Secondary | ICD-10-CM | POA: Diagnosis not present

## 2023-01-28 DIAGNOSIS — E559 Vitamin D deficiency, unspecified: Secondary | ICD-10-CM

## 2023-01-28 DIAGNOSIS — Z794 Long term (current) use of insulin: Secondary | ICD-10-CM | POA: Diagnosis not present

## 2023-01-28 LAB — BASIC METABOLIC PANEL
BUN: 17 mg/dL (ref 6–23)
CO2: 28 mEq/L (ref 19–32)
Calcium: 9.3 mg/dL (ref 8.4–10.5)
Chloride: 108 mEq/L (ref 96–112)
Creatinine, Ser: 0.81 mg/dL (ref 0.40–1.20)
GFR: 72.37 mL/min (ref >60.00)
Glucose, Bld: 85 mg/dL (ref 70–99)
Potassium: 4.1 mEq/L (ref 3.5–5.1)
Sodium: 142 mEq/L (ref 135–145)

## 2023-01-28 LAB — TSH: TSH: 2.38 u[IU]/mL (ref 0.35–5.50)

## 2023-01-28 LAB — CBC WITH DIFFERENTIAL/PLATELET
Basophils Absolute: 0.1 10*3/uL (ref 0.0–0.1)
Basophils Relative: 0.8 % (ref 0.0–3.0)
Eosinophils Absolute: 0.2 10*3/uL (ref 0.0–0.7)
Eosinophils Relative: 2.7 % (ref 0.0–5.0)
HCT: 40.6 % (ref 36.0–46.0)
Hemoglobin: 13.2 g/dL (ref 12.0–15.0)
Lymphocytes Relative: 25.7 % (ref 12.0–46.0)
Lymphs Abs: 2.3 10*3/uL (ref 0.7–4.0)
MCHC: 32.4 g/dL (ref 30.0–36.0)
MCV: 95.7 fl (ref 78.0–100.0)
Monocytes Absolute: 0.6 10*3/uL (ref 0.1–1.0)
Monocytes Relative: 6.3 % (ref 3.0–12.0)
Neutro Abs: 5.7 10*3/uL (ref 1.4–7.7)
Neutrophils Relative %: 64.5 % (ref 43.0–77.0)
Platelets: 178 10*3/uL (ref 150.0–400.0)
RBC: 4.25 Mil/uL (ref 3.87–5.11)
RDW: 14.9 % (ref 11.5–15.5)
WBC: 8.9 10*3/uL (ref 4.0–10.5)

## 2023-01-28 LAB — LIPID PANEL
Cholesterol: 156 mg/dL (ref 0–200)
HDL: 51.5 mg/dL (ref >39.00)
LDL Cholesterol: 79 mg/dL (ref 0–99)
NonHDL: 104.62
Total CHOL/HDL Ratio: 3
Triglycerides: 129 mg/dL (ref 0.0–149.0)
VLDL: 25.8 mg/dL (ref 0.0–40.0)

## 2023-01-28 LAB — HEMOGLOBIN A1C: Hgb A1c MFr Bld: 6.8 % — ABNORMAL HIGH (ref 4.6–6.5)

## 2023-01-28 LAB — HEPATIC FUNCTION PANEL
ALT: 7 U/L (ref 0–35)
AST: 10 U/L (ref 0–37)
Albumin: 3.9 g/dL (ref 3.5–5.2)
Alkaline Phosphatase: 57 U/L (ref 39–117)
Bilirubin, Direct: 0.1 mg/dL (ref 0.0–0.3)
Total Bilirubin: 0.2 mg/dL (ref 0.2–1.2)
Total Protein: 6.3 g/dL (ref 6.0–8.3)

## 2023-01-28 LAB — VITAMIN D 25 HYDROXY (VIT D DEFICIENCY, FRACTURES): VITD: 17.8 ng/mL — ABNORMAL LOW (ref 30.00–100.00)

## 2023-01-28 NOTE — Progress Notes (Signed)
Subjective:    Patient ID: Lacey [name], female    DOB: 11/12/1950, 72 y.o.   MRN: [id number]  HPI CPE- UTD on mammo, colonoscopy, immunizations.  Due for foot exam.  UTD on eye exam and microalbumin  Patient Care Team    Relationship Specialty Notifications Start End  Sheliah Hatch, MD PCP - General Family Medicine  07/15/21   Corky Crafts, MD PCP - Cardiology Cardiology  07/03/21   Duke Salvia, MD PCP - Electrophysiology Cardiology  07/03/21   Duke Salvia, MD Consulting Physician Cardiology  08/03/14   Lunette Stands, MD Consulting Physician Orthopedic Surgery  08/03/14   Elliot Cousin, OD Consulting Physician Optometry  05/01/15   Hilarie Fredrickson, MD Consulting Physician Gastroenterology  05/02/15   Rene Paci, MD Consulting Physician Urology  06/09/17   [place], Amy, MD Consulting Physician Dermatology  06/09/17   Teryl Lucy, MD Consulting Physician Orthopedic Surgery  06/16/18   Tat, Octaviano Batty, DO Consulting Physician Neurology  10/21/21      Health Maintenance  Topic Date Due   FOOT EXAM  01/02/2023   HEMOGLOBIN A1C  04/03/2023   Diabetic kidney evaluation - eGFR measurement  10/01/2023   Diabetic kidney evaluation - Urine ACR  10/01/2023   Lung Cancer Screening  10/26/2023   Medicare Annual Wellness (AWV)  01/14/2024   MAMMOGRAM  01/19/2024   OPHTHALMOLOGY EXAM  01/27/2024   Colonoscopy  09/03/2025   DEXA SCAN  01/14/2027   DTaP/Tdap/Td (4 - Td or Tdap) 01/24/2029   Pneumonia Vaccine 32+ Years old  Completed   INFLUENZA VACCINE  Completed   Hepatitis C Screening  Completed   Zoster Vaccines- Shingrix  Completed   HPV VACCINES  Aged Out   COVID-19 Vaccine  Discontinued      Review of Systems Patient reports no vision/ hearing changes, adenopathy,fever, weight change,  persistant/recurrent hoarseness , swallowing issues, chest pain, palpitations, edema, persistant/recurrent cough, hemoptysis, dyspnea (rest/exertional/paroxysmal nocturnal),  gastrointestinal bleeding (melena, rectal bleeding), abdominal pain, significant heartburn, bowel changes, GU symptoms (dysuria, hematuria, incontinence), Gyn symptoms (abnormal  bleeding, pain),  syncope, focal weakness, memory loss, skin/hair/nail changes, abnormal bruising or bleeding, anxiety, or depression.   + chronic neuropathy    Objective:   Physical Exam General Appearance:    Alert, cooperative, no distress, appears stated age, obese  Head:    Normocephalic, without obvious abnormality, atraumatic  Eyes:    PERRL, conjunctiva/corneas clear, EOM's intact both eyes  Ears:    Normal TM's and external ear canals, both ears  Nose:   Nares normal, septum midline, mucosa normal, no drainage    or sinus tenderness  Throat:   Lips, mucosa, and tongue normal; teeth and gums normal  Neck:   Supple, symmetrical, trachea midline, no adenopathy;    Thyroid: no enlargement/tenderness/nodules  Back:     Symmetric, no curvature, ROM normal, no CVA tenderness  Lungs:     Clear to auscultation bilaterally, respirations unlabored  Chest Wall:    No tenderness or deformity   Heart:    Regular rate and rhythm, S1 and S2 normal, no murmur, rub   or gallop  Breast Exam:    Deferred to GYN  Abdomen:     Soft, non-tender, bowel sounds active all four quadrants,    no masses, no organomegaly  Genitalia:    Deferred  Rectal:    Extremities:   Extremities normal, atraumatic, no cyanosis or edema  Pulses:   2+ and symmetric  all extremities  Skin:   Skin color, texture, turgor normal, no rashes or lesions  Lymph nodes:   Cervical, supraclavicular, and axillary nodes normal  Neurologic:   CNII-XII intact, normal strength, sensation and reflexes    throughout          Assessment & Plan:

## 2023-01-28 NOTE — Patient Instructions (Addendum)
Follow up in 3 months to recheck diabetes We'll notify you of your lab results and make any changes if needed Continue to work on healthy diet and regular exercise- you're doing great! Decrease the Lantus to 30 units nightly- we can adjust based on your sugars Call with any questions or concerns Stay Safe!  Stay Healthy! Happy Fall!!  (The season, not the action!  Stay on your feet!)

## 2023-01-29 ENCOUNTER — Telehealth: Payer: Self-pay

## 2023-01-29 MED ORDER — VITAMIN D (ERGOCALCIFEROL) 1.25 MG (50000 UNIT) PO CAPS: 50000.0000 [IU] | ORAL_CAPSULE | ORAL | 0 refills | Status: DC

## 2023-01-29 NOTE — Telephone Encounter (Signed)
Vitamin D sent to pharmacy per lab note directions

## 2023-01-31 NOTE — Assessment & Plan Note (Signed)
Check labs and replete prn. 

## 2023-01-31 NOTE — Assessment & Plan Note (Signed)
Chronic problem.  UTD on eye exam and microalbumin.  Foot exam done today.  Pt reports she is having frequent lows and is having to adjust her insulin.  Will decrease Lantus to 30 units and titrate up as needed.  Pt expressed understanding and is in agreement w/ plan.

## 2023-01-31 NOTE — Assessment & Plan Note (Signed)
Pt's PE unchanged from previous.  UTD on mammo, colonoscopy, immunizations.  Check labs.  Anticipatory guidance provided.

## 2023-02-01 DIAGNOSIS — Z0100 Encounter for examination of eyes and vision without abnormal findings: Secondary | ICD-10-CM | POA: Diagnosis not present

## 2023-02-15 ENCOUNTER — Other Ambulatory Visit: Payer: Self-pay | Admitting: Family Medicine

## 2023-02-15 DIAGNOSIS — G2581 Restless legs syndrome: Secondary | ICD-10-CM

## 2023-02-15 NOTE — Telephone Encounter (Signed)
Patient is requesting a refill of the following medications: Requested Prescriptions   Pending Prescriptions Disp Refills   clonazePAM (KLONOPIN) 1 MG tablet [Pharmacy Med Name: CLONAZEPAM 1 MG TABLET] 30 tablet 3    Sig: TAKE 1 TABLET AT BEDTIME FOR RESTLESS LEGS SYNDROME    Date of patient request: 02/13/23 Last office visit: 01/28/23 Date of last refill: 10/20/22 Last refill amount: 30 Follow up time period per chart: 3 months

## 2023-02-17 ENCOUNTER — Emergency Department (HOSPITAL_COMMUNITY): Payer: Medicare HMO

## 2023-02-17 ENCOUNTER — Ambulatory Visit (HOSPITAL_BASED_OUTPATIENT_CLINIC_OR_DEPARTMENT_OTHER): Payer: Medicare HMO | Admitting: Orthopaedic Surgery

## 2023-02-17 ENCOUNTER — Emergency Department (HOSPITAL_COMMUNITY)
Admission: EM | Admit: 2023-02-17 | Discharge: 2023-02-17 | Disposition: A | Discharge status: Home / Self Care | Payer: Medicare HMO | Attending: Emergency Medicine | Admitting: Emergency Medicine

## 2023-02-17 ENCOUNTER — Other Ambulatory Visit: Payer: Self-pay

## 2023-02-17 DIAGNOSIS — Z794 Long term (current) use of insulin: Secondary | ICD-10-CM | POA: Diagnosis not present

## 2023-02-17 DIAGNOSIS — I509 Heart failure, unspecified: Secondary | ICD-10-CM | POA: Insufficient documentation

## 2023-02-17 DIAGNOSIS — I2510 Atherosclerotic heart disease of native coronary artery without angina pectoris: Secondary | ICD-10-CM | POA: Insufficient documentation

## 2023-02-17 DIAGNOSIS — S199XXA Unspecified injury of neck, initial encounter: Secondary | ICD-10-CM | POA: Diagnosis not present

## 2023-02-17 DIAGNOSIS — S3991XA Unspecified injury of abdomen, initial encounter: Secondary | ICD-10-CM | POA: Diagnosis not present

## 2023-02-17 DIAGNOSIS — S3993XA Unspecified injury of pelvis, initial encounter: Secondary | ICD-10-CM | POA: Diagnosis not present

## 2023-02-17 DIAGNOSIS — S4992XA Unspecified injury of left shoulder and upper arm, initial encounter: Secondary | ICD-10-CM | POA: Diagnosis not present

## 2023-02-17 DIAGNOSIS — M25512 Pain in left shoulder: Secondary | ICD-10-CM | POA: Diagnosis not present

## 2023-02-17 DIAGNOSIS — M19031 Primary osteoarthritis, right wrist: Secondary | ICD-10-CM | POA: Diagnosis not present

## 2023-02-17 DIAGNOSIS — S6991XA Unspecified injury of right wrist, hand and finger(s), initial encounter: Secondary | ICD-10-CM | POA: Diagnosis not present

## 2023-02-17 DIAGNOSIS — S299XXA Unspecified injury of thorax, initial encounter: Secondary | ICD-10-CM | POA: Diagnosis not present

## 2023-02-17 DIAGNOSIS — E119 Type 2 diabetes mellitus without complications: Secondary | ICD-10-CM | POA: Insufficient documentation

## 2023-02-17 DIAGNOSIS — S4991XA Unspecified injury of right shoulder and upper arm, initial encounter: Secondary | ICD-10-CM | POA: Diagnosis not present

## 2023-02-17 DIAGNOSIS — S0990XA Unspecified injury of head, initial encounter: Secondary | ICD-10-CM | POA: Diagnosis not present

## 2023-02-17 DIAGNOSIS — M79604 Pain in right leg: Secondary | ICD-10-CM | POA: Diagnosis not present

## 2023-02-17 DIAGNOSIS — M25531 Pain in right wrist: Secondary | ICD-10-CM | POA: Insufficient documentation

## 2023-02-17 DIAGNOSIS — M25561 Pain in right knee: Secondary | ICD-10-CM | POA: Diagnosis not present

## 2023-02-17 DIAGNOSIS — M25511 Pain in right shoulder: Secondary | ICD-10-CM | POA: Diagnosis not present

## 2023-02-17 DIAGNOSIS — Y92410 Unspecified street and highway as the place of occurrence of the external cause: Secondary | ICD-10-CM | POA: Insufficient documentation

## 2023-02-17 DIAGNOSIS — M19012 Primary osteoarthritis, left shoulder: Secondary | ICD-10-CM | POA: Diagnosis not present

## 2023-02-17 DIAGNOSIS — Z7982 Long term (current) use of aspirin: Secondary | ICD-10-CM | POA: Insufficient documentation

## 2023-02-17 DIAGNOSIS — Z96611 Presence of right artificial shoulder joint: Secondary | ICD-10-CM | POA: Diagnosis not present

## 2023-02-17 DIAGNOSIS — M25532 Pain in left wrist: Secondary | ICD-10-CM | POA: Diagnosis not present

## 2023-02-17 DIAGNOSIS — Z981 Arthrodesis status: Secondary | ICD-10-CM | POA: Diagnosis not present

## 2023-02-17 DIAGNOSIS — R0789 Other chest pain: Secondary | ICD-10-CM | POA: Insufficient documentation

## 2023-02-17 DIAGNOSIS — M19011 Primary osteoarthritis, right shoulder: Secondary | ICD-10-CM | POA: Diagnosis not present

## 2023-02-17 LAB — I-STAT CHEM 8, ED
BUN: 12 mg/dL (ref 8–23)
BUN: 17 mg/dL (ref 8–23)
Calcium, Ion: 0.99 mmol/L — ABNORMAL LOW (ref 1.15–1.40)
Calcium, Ion: 1.14 mmol/L — ABNORMAL LOW (ref 1.15–1.40)
Chloride: 104 mmol/L (ref 98–111)
Chloride: 107 mmol/L (ref 98–111)
Creatinine, Ser: 0.7 mg/dL (ref 0.44–1.00)
Creatinine, Ser: 0.7 mg/dL (ref 0.44–1.00)
Glucose, Bld: 204 mg/dL — ABNORMAL HIGH (ref 70–99)
Glucose, Bld: 215 mg/dL — ABNORMAL HIGH (ref 70–99)
HCT: 34 % — ABNORMAL LOW (ref 36.0–46.0)
HCT: 36 % (ref 36.0–46.0)
Hemoglobin: 11.6 g/dL — ABNORMAL LOW (ref 12.0–15.0)
Hemoglobin: 12.2 g/dL (ref 12.0–15.0)
Potassium: 4 mmol/L (ref 3.5–5.1)
Potassium: 6.6 mmol/L (ref 3.5–5.1)
Sodium: 137 mmol/L (ref 135–145)
Sodium: 140 mmol/L (ref 135–145)
TCO2: 23 mmol/L (ref 22–32)
TCO2: 24 mmol/L (ref 22–32)

## 2023-02-17 LAB — COMPREHENSIVE METABOLIC PANEL
ALT: 11 U/L (ref 0–44)
AST: 18 U/L (ref 15–41)
Albumin: 3.3 g/dL — ABNORMAL LOW (ref 3.5–5.0)
Alkaline Phosphatase: 54 U/L (ref 38–126)
Anion gap: 9 (ref 5–15)
BUN: 11 mg/dL (ref 8–23)
CO2: 25 mmol/L (ref 22–32)
Calcium: 8.4 mg/dL — ABNORMAL LOW (ref 8.9–10.3)
Chloride: 107 mmol/L (ref 98–111)
Creatinine, Ser: 0.7 mg/dL (ref 0.44–1.00)
GFR, Estimated: >60 mL/min (ref >60)
Glucose, Bld: 149 mg/dL — ABNORMAL HIGH (ref 70–99)
Potassium: 3.9 mmol/L (ref 3.5–5.1)
Sodium: 141 mmol/L (ref 135–145)
Total Bilirubin: 0.3 mg/dL (ref 0.3–1.2)
Total Protein: 5.8 g/dL — ABNORMAL LOW (ref 6.5–8.1)

## 2023-02-17 LAB — CBC WITH DIFFERENTIAL/PLATELET
Abs Immature Granulocytes: 0.06 10*3/uL (ref 0.00–0.07)
Basophils Absolute: 0.1 10*3/uL (ref 0.0–0.1)
Basophils Relative: 1 %
Eosinophils Absolute: 0.2 10*3/uL (ref 0.0–0.5)
Eosinophils Relative: 2 %
HCT: 37.6 % (ref 36.0–46.0)
Hemoglobin: 12.3 g/dL (ref 12.0–15.0)
Immature Granulocytes: 1 %
Lymphocytes Relative: 13 %
Lymphs Abs: 1.4 10*3/uL (ref 0.7–4.0)
MCH: 31.5 pg (ref 26.0–34.0)
MCHC: 32.7 g/dL (ref 30.0–36.0)
MCV: 96.2 fL (ref 80.0–100.0)
Monocytes Absolute: 0.7 10*3/uL (ref 0.1–1.0)
Monocytes Relative: 7 %
Neutro Abs: 8 10*3/uL — ABNORMAL HIGH (ref 1.7–7.7)
Neutrophils Relative %: 76 %
Platelets: 146 10*3/uL — ABNORMAL LOW (ref 150–400)
RBC: 3.91 MIL/uL (ref 3.87–5.11)
RDW: 14 % (ref 11.5–15.5)
WBC: 10.4 10*3/uL (ref 4.0–10.5)
nRBC: 0 % (ref 0.0–0.2)

## 2023-02-17 LAB — TROPONIN I (HIGH SENSITIVITY)
Troponin I (High Sensitivity): 12 ng/L (ref <18)
Troponin I (High Sensitivity): 14 ng/L (ref <18)

## 2023-02-17 LAB — CBG MONITORING, ED: Glucose-Capillary: 222 mg/dL — ABNORMAL HIGH (ref 70–99)

## 2023-02-17 MED ORDER — DICLOFENAC SODIUM 1 % EX GEL: 4.0000 g | Freq: Four times a day (QID) | CUTANEOUS | 0 refills | Status: DC

## 2023-02-17 MED ORDER — IOHEXOL 350 MG/ML SOLN
75.0000 mL | Freq: Once | INTRAVENOUS | Status: AC | PRN
  Administered 2023-02-17: 75 mL via INTRAVENOUS
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
## 2023-02-17 NOTE — ED Notes (Signed)
Patient verbalizes understanding of discharge instructions. Opportunity for questioning and answers were provided. Pt discharged from ED.
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
## 2023-02-17 NOTE — Progress Notes (Signed)
Post Operative Evaluation    Procedure/Date of Surgery: Right shoulder reverse shoulder arthroplasty July 12, 2022  Interval History:    Presents today for follow-up of her right shoulder.  Overall she is continuing to improve slowly.  She has been recently dealing with significant issues with her family and her daughter is currently hospitalized for possibility of colon cancer.  Overall her strength is improving with overhead activity.  She is still having a hard time reaching to her hair for hygiene.   PMH/PSH/Family History/Social History/Meds/Allergies:    Past Medical History:  Diagnosis Date   Adenomatous colon polyp    hyperplastic   Anemia    Anginal pain (HCC)    not recent   Arthritis    NECK, KNEES, FINGERS, TOES   Atrial tachycardia CARDIOLOGIST - DR Graciela Husbands (LAST VISIT AUG 2012)   Echo 12/11: EF 55-60%, Mild LVH, grade 1 diast dysfxn;   holter 1/12: ATach   Atypical chest pain    a. 07/2004 Cath: Clean cors;  b. 04/2010 Myoview: EF 63%, no ischemia   Blood transfusion without reported diagnosis 1969   CHF (congestive heart failure) (HCC)    Chronic kidney disease    left kidney small    Coronary artery disease    Diabetes mellitus, type 2 (HCC)    ORAL AND INSULIN MEDS (LAST A1C  7.3)   Diverticulosis    Erythema CURRENT-- CLOSED ABD. WALL ABSCESS   PER PCP NOTE (03-31-11)- MRSA-- TAKES DOXYCYCLINE   GERD (gastroesophageal reflux disease)    CONTROLLED W/ PREVACID   History of kidney stones 2012   Hyperlipidemia    Hypertension    Insomnia    TAKES MEDS PRN   Neuropathy, peripheral    Obesity (BMI 30-39.9) 02/19/2015   Pneumonia    as child   Post op infection 11/07/2012   left bunionectomy   Restless leg syndrome    Sepsis, urinary HISTORY - 2004   Sleep apnea    no cpap    Thyroid disease    TIA (transient ischemic attack)    "mini strokes" 2023   Tremor    Vitamin D deficiency    Past Surgical History:   Procedure Laterality Date   ABDOMINAL HYSTERECTOMY  1995   ovaries remain   BUNIONECTOMY Left 10/24/2012   BUNIONECTOMY Right 05/17/2017   CARDIAC CATHETERIZATION  07/10/2004   CARPAL TUNNEL RELEASE  2000   RIGHT   CATARACT EXTRACTION, BILATERAL     CERVICAL FUSION  10/12/2007   C5 - 7   COLONOSCOPY     CYSTOSCOPY WITH RETROGRADE PYELOGRAM, URETEROSCOPY AND STENT PLACEMENT Left 11/28/2012   Procedure: LEFT RETROGRADE PYELOGRAM, LEFT URETEROSCOPY, ;  Surgeon: Garnett Farm, MD;  Location: [facility];  Service: Urology;  Laterality: Left;   CYSTOSCOPY/RETROGRADE/URETEROSCOPY/STONE EXTRACTION WITH BASKET  X2 2004 & 2009   LEFT    GASTRIC BYPASS  1981   KNEE ARTHROSCOPY  12/2010   LEFT   LAPAROSCOPIC CHOLECYSTECTOMY  2001   LEFT HEART CATH AND CORONARY ANGIOGRAPHY N/A 12/03/2021   Procedure: LEFT HEART CATH AND CORONARY ANGIOGRAPHY;  Surgeon: Corky Crafts, MD;  Location: [facility] INVASIVE CV [facility];  Service: Cardiovascular;  Laterality: N/A;   left thumb joint surgery  2013   PERCUTANEOUS NEPHROSTOLITHOTOMY  02/27/2011   LEFT   POLYPECTOMY  REVERSE SHOULDER ARTHROPLASTY Right 07/12/2022   Procedure: REVERSE SHOULDER ARTHROPLASTY;  Surgeon: Huel Cote, MD;  Location: [facility];  Service: Orthopedics;  Laterality: Right;   RIGHT THUMB JOINT SURG.  09/2009   TRIGGER FINGER RELEASE  2010   RIGHT THUMB   UPPER GASTROINTESTINAL ENDOSCOPY     URETERAL STENT PLACEMENT  X2  2004  &  2009   LEFT   URETEROSCOPY  04/06/2011   Procedure: URETEROSCOPY;  Surgeon: Garnett Farm, MD;  Location: [facility];  Service: Urology;  Laterality: Left;  L Ureteroscopy Laser Litho & Stent    Social History   Socioeconomic History   Marital status: Married    Spouse name: Not on file   Number of children: 2   Years of education: 12   Highest education level: 12th grade  Occupational History   Occupation: Educational psychologist middle school    Employer: [employer]    Comment: in office    Occupation: Retired  Tobacco Use   Smoking status: Every Day    Current packs/day: 2.00    Average packs/day: 2.0 packs/day for 43.0 years (86.0 ttl pk-yrs)    Types: Cigarettes   Smokeless tobacco: Never   Tobacco comments:    1/2 ppd 12/29/21  Vaping Use   Vaping status: Never Used  Substance and Sexual Activity   Alcohol use: No    Alcohol/week: 0.0 standard drinks of alcohol   Drug use: No   Sexual activity: Not Currently  Other Topics Concern   Not on file  Social History Narrative   2 children, 2 stepchildren   Lives with husband.   Right-handed.   No daily caffeine.   Social Determinants of Health   Financial Resource Strain: Low Risk  (01/14/2023)   Overall Financial Resource Strain (CARDIA)    Difficulty of Paying Living Expenses: Not hard at all  Food Insecurity: No Food Insecurity (01/14/2023)   Hunger Vital Sign    Worried About Running Out of Food in the Last Year: Never true    Ran Out of Food in the Last Year: Never true  Transportation Needs: No Transportation Needs (01/14/2023)   PRAPARE - Administrator, Civil Service (Medical): No    Lack of Transportation (Non-Medical): No  Physical Activity: Inactive (01/14/2023)   Exercise Vital Sign    Days of Exercise per Week: 0 days    Minutes of Exercise per Session: 0 min  Stress: No Stress Concern Present (01/14/2023)   [employer] of Occupational Health - Occupational Stress Questionnaire    Feeling of Stress : Not at all  Social Connections: Moderately Isolated (01/14/2023)   Social Connection and Isolation Panel [NHANES]    Frequency of Communication with Friends and Family: Never    Frequency of Social Gatherings with Friends and Family: Never    Attends Religious Services: More than 4 times per year    Active Member of Clubs or Organizations: No    Attends Engineer, structural: Never    Marital Status: Married   Family History  Problem Relation Age of Onset   Hyperlipidemia  Mother    Hypertension Mother    Heart attack Father    Lung disease Father    Heart disease Sister    Hypertension Sister    Colon polyps Sister    Hypertension Sister    Colon polyps Sister    Hypertension Sister    Hypertension Sister    Lung cancer Sister 102  stage 4    Diabetes Other    Breast cancer Other    Heart disease Other    Colon cancer Other 57       nephew   Esophageal cancer Neg Hx    Rectal cancer Neg Hx    Stomach cancer Neg Hx    Amblyopia Neg Hx    Blindness Neg Hx    Cataracts Neg Hx    Glaucoma Neg Hx    Retinal detachment Neg Hx    Strabismus Neg Hx    Retinitis pigmentosa Neg Hx    No Known Allergies Current Outpatient Medications  Medication Sig Dispense Refill   albuterol (VENTOLIN HFA) 108 (90 Base) MCG/ACT inhaler TAKE 2 PUFFS BY MOUTH EVERY 6 HOURS AS NEEDED FOR WHEEZE OR SHORTNESS OF BREATH (Patient taking differently: Inhale 2 puffs into the lungs every 6 (six) hours as needed for wheezing or shortness of breath.) 18 each 6   Alcohol Swabs (DROPSAFE ALCOHOL PREP) 70 % PADS USE AS DIRECTED  AS NEEDED. 200 each 3   aspirin EC 81 MG tablet Take 1 tablet (81 mg total) by mouth daily. 30 tablet 11   atorvastatin (LIPITOR) 20 MG tablet TAKE 1 TABLET BY MOUTH EVERY DAY 90 tablet 1   Blood Glucose Monitoring Suppl (TRUE METRIX METER) w/Device KIT Use as directed 3-4 times daily.  Dx- type 2 diabetes w/ use of insulin 1 kit 1   calcium-vitamin D (OSCAL WITH D) 500-5 MG-MCG tablet Take 1 tablet by mouth.     cephALEXin (KEFLEX) 500 MG capsule Take 500 mg by mouth daily at 12 noon.     Cholecalciferol (VITAMIN D-3) 25 MCG (1000 UT) CAPS Take 1,000 Units by mouth daily.     clonazePAM (KLONOPIN) 1 MG tablet TAKE 1 TABLET AT BEDTIME FOR RESTLESS LEGS SYNDROME 30 tablet 3   Continuous Blood Gluc Receiver (DEXCOM G6 RECEIVER) DEVI Use as directed w/ G6 sensor 1 each 0   Continuous Blood Gluc Sensor (DEXCOM G6 SENSOR) MISC Apply sensor every 10 days for  continuous blood sugar readings 3 each 3   Continuous Blood Gluc Transmit (DEXCOM G6 TRANSMITTER) MISC Use as directed w/ G6 sensor 1 each 0   diclofenac Sodium (VOLTAREN) 1 % GEL APPLY 2 GRAMS TO AFFECTED AREA 4 TIMES A DAY (Patient taking differently: Apply 2 g topically 4 (four) times daily as needed (for pain- hands and knees).) 300 g 1   fenofibrate 160 MG tablet Take 1 tablet (160 mg total) by mouth daily. 90 tablet 10   furosemide (LASIX) 20 MG tablet TAKE 1 TABLET EVERY DAY 90 tablet 1   gabapentin (NEURONTIN) 300 MG capsule Take 300 mg by mouth 2 (two) times daily.     glucose blood test strip Use as instructed 100 each 12   insulin aspart (NOVOLOG FLEXPEN) 100 UNIT/ML FlexPen INJECT 25 UNITS SUBCUTANEOUSLY THREE TIMES DAILY WITH MEALS 75 mL 4   insulin glargine (LANTUS SOLOSTAR) 100 UNIT/ML Solostar Pen INJECT 60 UNITS UNDER THE SKIN AT BEDTIME (Patient taking differently: Inject 60 Units into the skin at bedtime.) 60 mL 10   Insulin Pen Needle (DROPLET PEN NEEDLES) 31G X 8 MM MISC USE 4 TIMES DAILY 400 each 6   Lancets Super Thin 28G MISC Please use as directed to test sugars 4 times daily. Dx. E11.9 420 each 3   lansoprazole (PREVACID) 15 MG capsule Take 15 mg by mouth daily before breakfast.     levothyroxine (SYNTHROID) 50 MCG tablet  TAKE 1 TABLET BY MOUTH EVERY DAY BEFORE BREAKFAST 90 tablet 1   loperamide (IMODIUM A-D) 2 MG tablet Take 2 mg by mouth 4 (four) times daily as needed for diarrhea or loose stools.     OneTouch UltraSoft 2 Lancets MISC 1 Device by Does not apply route 4 (four) times daily. 100 each 3   primidone (MYSOLINE) 50 MG tablet Take 2 tablets (100 mg total) by mouth 2 (two) times daily. 360 tablet 1   sertraline (ZOLOFT) 50 MG tablet Take 1 tablet (50 mg total) by mouth daily. 90 tablet 3   traZODone (DESYREL) 100 MG tablet Take 1 tablet (100 mg total) by mouth at bedtime. 90 tablet 3   valsartan (DIOVAN) 320 MG tablet Take 1 tablet (320 mg total) by mouth daily.  90 tablet 2   verapamil (CALAN-SR) 120 MG CR tablet TAKE 1 TABLET BY MOUTH EVERYDAY AT BEDTIME 90 tablet 1   Vibegron (GEMTESA) 75 MG TABS Take 1 tablet by mouth daily.     Vitamin D, Ergocalciferol, (DRISDOL) 1.25 MG (50000 UNIT) CAPS capsule Take 1 capsule (50,000 Units total) by mouth every 7 (seven) days. 12 capsule 0   No current facility-administered medications for this visit.   No results found.  Review of Systems:   A ROS was performed including pertinent positives and negatives as documented in the HPI.   Musculoskeletal Exam:     Right shoulder incision is healed.  Stable to flex and extend at the right elbow.  Active forward elevation is to 80 degrees with external rotation at side of 20 degrees.  Internal rotation is to side.  Sensation is intact in axillary distribution.  Fires EPL as well as wrist extensors.  2+ radial pulse  Imaging:    AP right shoulder: Status post reverse shoulder arthroplasty without evidence of complication  I personally reviewed and interpreted the radiographs.   Assessment:   72 year old female is status post right shoulder reverse shoulder arthroplasty for fracture.  Overall she is continuing to improve slowly which is consistent with transient nerve palsy from her shoulder dislocation.  At this time she does not have evidence of permanent axillary nerve injury.  I will plan to continue following her.  I will plan to see her in 3 months for reassessment Plan :    -Return to clinic 3 months for reassessment      I personally saw and evaluated the patient, and participated in the management and treatment plan.  Huel Cote, MD Attending Physician, Orthopedic Surgery  This document was dictated using Dragon voice recognition software. A reasonable attempt at proof reading has been made to minimize errors.

## 2023-02-19 ENCOUNTER — Ambulatory Visit: Payer: Medicare HMO | Admitting: Family Medicine

## 2023-02-19 ENCOUNTER — Encounter: Payer: Self-pay | Admitting: Family Medicine

## 2023-02-19 DIAGNOSIS — E1151 Type 2 diabetes mellitus with diabetic peripheral angiopathy without gangrene: Secondary | ICD-10-CM | POA: Insufficient documentation

## 2023-02-19 DIAGNOSIS — R233 Spontaneous ecchymoses: Secondary | ICD-10-CM | POA: Diagnosis not present

## 2023-02-19 DIAGNOSIS — M25531 Pain in right wrist: Secondary | ICD-10-CM

## 2023-02-19 DIAGNOSIS — R2689 Other abnormalities of gait and mobility: Secondary | ICD-10-CM | POA: Insufficient documentation

## 2023-02-19 NOTE — Patient Instructions (Addendum)
Follow up as needed or as scheduled Wear the wrist brace this weekend and call the Orthopedic office on Monday ICE the wrist HEAT the bruises to help them reabsorb Call with any questions or concerns Hang in there!!!
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
## 2023-02-22 ENCOUNTER — Telehealth: Payer: Self-pay | Admitting: Orthopaedic Surgery

## 2023-02-22 NOTE — Telephone Encounter (Signed)
Scheduled 10/23
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
## 2023-02-23 ENCOUNTER — Ambulatory Visit: Payer: Medicare HMO | Admitting: Internal Medicine

## 2023-02-23 DIAGNOSIS — I5032 Chronic diastolic (congestive) heart failure: Secondary | ICD-10-CM

## 2023-02-23 DIAGNOSIS — I4719 Other supraventricular tachycardia: Secondary | ICD-10-CM

## 2023-02-24 ENCOUNTER — Ambulatory Visit (HOSPITAL_BASED_OUTPATIENT_CLINIC_OR_DEPARTMENT_OTHER): Payer: Medicare HMO | Admitting: Orthopaedic Surgery

## 2023-02-24 DIAGNOSIS — Z96611 Presence of right artificial shoulder joint: Secondary | ICD-10-CM
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
## 2023-02-26 ENCOUNTER — Other Ambulatory Visit: Payer: Self-pay | Admitting: Family Medicine

## 2023-02-26 DIAGNOSIS — I10 Essential (primary) hypertension: Secondary | ICD-10-CM

## 2023-03-18 ENCOUNTER — Ambulatory Visit (INDEPENDENT_AMBULATORY_CARE_PROVIDER_SITE_OTHER): Payer: Medicare HMO | Admitting: Family Medicine

## 2023-03-18 ENCOUNTER — Other Ambulatory Visit: Payer: Self-pay | Admitting: Family Medicine

## 2023-03-18 ENCOUNTER — Encounter: Payer: Self-pay | Admitting: Family Medicine

## 2023-03-18 VITALS — BP 142/60 | HR 96 | Temp 97.6°F | Ht 62.0 in | Wt 202.1 lb

## 2023-03-18 DIAGNOSIS — R051 Acute cough: Secondary | ICD-10-CM | POA: Diagnosis not present

## 2023-03-18 DIAGNOSIS — N6320 Unspecified lump in the left breast, unspecified quadrant: Secondary | ICD-10-CM

## 2023-03-18 DIAGNOSIS — J440 Chronic obstructive pulmonary disease with acute lower respiratory infection: Secondary | ICD-10-CM

## 2023-03-18 DIAGNOSIS — N6325 Unspecified lump in the left breast, overlapping quadrants: Secondary | ICD-10-CM

## 2023-03-18 DIAGNOSIS — N644 Mastodynia: Secondary | ICD-10-CM

## 2023-03-18 DIAGNOSIS — J209 Acute bronchitis, unspecified: Secondary | ICD-10-CM

## 2023-03-18 DIAGNOSIS — F419 Anxiety disorder, unspecified: Secondary | ICD-10-CM

## 2023-03-18 MED ORDER — SERTRALINE HCL 100 MG PO TABS: 100.0000 mg | ORAL_TABLET | Freq: Every day | ORAL | 3 refills | Status: DC

## 2023-03-18 MED ORDER — DOXYCYCLINE HYCLATE 100 MG PO TABS: 100.0000 mg | ORAL_TABLET | Freq: Two times a day (BID) | ORAL | 0 refills | Status: DC
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
## 2023-03-19 ENCOUNTER — Other Ambulatory Visit: Payer: Self-pay | Admitting: Family Medicine

## 2023-03-19 LAB — POC COVID19 BINAXNOW: SARS Coronavirus 2 Ag: NEGATIVE
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
## 2023-03-24 ENCOUNTER — Telehealth: Payer: Self-pay | Admitting: Family Medicine

## 2023-03-24 NOTE — Telephone Encounter (Signed)
Changed referral to [facility] . Pt has been notified.

## 2023-03-24 NOTE — Telephone Encounter (Signed)
Caller name: Demeshia Yoke  On DPR?: Yes  Call back number: [phone] (home)  Provider they see: Sheliah Hatch, MD  Reason for call:   Pt needs order to [facility] where her records are for the mammogram

## 2023-03-24 NOTE — Addendum Note (Signed)
Addended by: Lenard Simmer on: 03/24/2023 11:30 AM   Modules accepted: Orders

## 2023-04-07 DIAGNOSIS — N6325 Unspecified lump in the left breast, overlapping quadrants: Secondary | ICD-10-CM | POA: Diagnosis not present

## 2023-04-08 ENCOUNTER — Encounter: Payer: Self-pay | Admitting: Family Medicine

## 2023-04-12 DIAGNOSIS — R3989 Other symptoms and signs involving the genitourinary system: Secondary | ICD-10-CM | POA: Diagnosis not present

## 2023-04-12 DIAGNOSIS — N3020 Other chronic cystitis without hematuria: Secondary | ICD-10-CM | POA: Diagnosis not present

## 2023-04-12 DIAGNOSIS — R8271 Bacteriuria: Secondary | ICD-10-CM | POA: Diagnosis not present

## 2023-04-17 ENCOUNTER — Other Ambulatory Visit: Payer: Self-pay | Admitting: Family Medicine

## 2023-04-20 ENCOUNTER — Ambulatory Visit: Payer: Medicare HMO | Attending: Internal Medicine | Admitting: Internal Medicine

## 2023-04-20 ENCOUNTER — Encounter: Payer: Self-pay | Admitting: Internal Medicine

## 2023-04-20 VITALS — BP 144/60 | HR 58 | Ht 62.0 in | Wt 205.0 lb

## 2023-04-20 DIAGNOSIS — I5032 Chronic diastolic (congestive) heart failure: Secondary | ICD-10-CM

## 2023-04-20 DIAGNOSIS — I4719 Other supraventricular tachycardia: Secondary | ICD-10-CM | POA: Diagnosis not present

## 2023-04-20 NOTE — Patient Instructions (Signed)
Medication Instructions:  Your physician recommends that you continue on your current medications as directed. Please refer to the Current Medication list given to you today.  *If you need a refill on your cardiac medications before your next appointment, please call your pharmacy*   Lab Work: None ordered.  If you have labs (blood work) drawn today and your tests are completely normal, you will receive your results only by: [facility] (if you have MyChart) OR A paper copy in the mail If you have any lab test that is abnormal or we need to change your treatment, we will call you to review the results.   Testing/Procedures: None ordered.    Follow-Up: At [facility], you and your health needs are our priority.  As part of our continuing mission to provide you with exceptional heart care, we have created designated Provider Care Teams.  These Care Teams include your primary Cardiologist (physician) and Advanced Practice Providers (APPs -  Physician Assistants and Nurse Practitioners) who all work together to provide you with the care you need, when you need it.  We recommend signing up for the patient portal called "MyChart".  Sign up information is provided on this After Visit Summary.  MyChart is used to connect with patients for Virtual Visits (Telemedicine).  Patients are able to view lab/test results, encounter notes, upcoming appointments, etc.  Non-urgent messages can be sent to your provider as well.   To learn more about what you can do with MyChart, go to [email or web address].    Your next appointment:   12 months with Dr Caryl Comes
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
## 2023-04-29 ENCOUNTER — Ambulatory Visit: Payer: Medicare HMO | Admitting: Family Medicine

## 2023-05-03 ENCOUNTER — Encounter: Payer: Self-pay | Admitting: Family Medicine

## 2023-05-03 ENCOUNTER — Ambulatory Visit (INDEPENDENT_AMBULATORY_CARE_PROVIDER_SITE_OTHER): Payer: Medicare HMO | Admitting: Family Medicine

## 2023-05-03 DIAGNOSIS — E113391 Type 2 diabetes mellitus with moderate nonproliferative diabetic retinopathy without macular edema, right eye: Secondary | ICD-10-CM | POA: Diagnosis not present

## 2023-05-03 DIAGNOSIS — Z794 Long term (current) use of insulin: Secondary | ICD-10-CM | POA: Diagnosis not present

## 2023-05-03 LAB — BASIC METABOLIC PANEL
BUN: 14 mg/dL (ref 6–23)
CO2: 28 meq/L (ref 19–32)
Calcium: 9.1 mg/dL (ref 8.4–10.5)
Chloride: 104 meq/L (ref 96–112)
Creatinine, Ser: 0.73 mg/dL (ref 0.40–1.20)
GFR: 81.84 mL/min (ref >60.00)
Glucose, Bld: 158 mg/dL — ABNORMAL HIGH (ref 70–99)
Potassium: 4.9 meq/L (ref 3.5–5.1)
Sodium: 139 meq/L (ref 135–145)

## 2023-05-03 LAB — HEMOGLOBIN A1C: Hgb A1c MFr Bld: 7.3 % — ABNORMAL HIGH (ref 4.6–6.5)

## 2023-05-03 MED ORDER — GLUCOSE BLOOD VI STRP: 1.0000 | ORAL_STRIP | Freq: Three times a day (TID) | 3 refills | Status: DC

## 2023-05-03 NOTE — Assessment & Plan Note (Signed)
Chronic problem.  UTD on foot exam, eye exam, microalbumin.  On Novolog 25units TID and Lantus 30 units at bedtime.  Other than chronic neuropathy, pt has been asymptomatic.  Check labs.  Adjust meds prn

## 2023-05-03 NOTE — Progress Notes (Signed)
   Subjective:    Patient ID: Lacey [name], female    DOB: 09/09/1950, 72 y.o.   MRN: [id number]  HPI DM- chronic problem.  On Novolog 25units TID, Lantus 30 units at bedtime.  UTD on foot exam, eye exam, microalbumin.  No CP, SOB above baseline, HA's, visual changes, abd pain, N/V.  Rare symptomatic lows.  + Neuropathy of feet bilaterally.   Review of Systems For ROS see HPI     Objective:   Physical Exam Vitals reviewed.  Constitutional:      General: She is not in acute distress.    Appearance: Normal appearance. She is well-developed. She is obese. She is not ill-appearing.  HENT:     Head: Normocephalic and atraumatic.  Eyes:     Conjunctiva/sclera: Conjunctivae normal.     Pupils: Pupils are equal, round, and reactive to light.  Neck:     Thyroid: No thyromegaly.  Cardiovascular:     Rate and Rhythm: Normal rate and regular rhythm.     Pulses: Normal pulses.     Heart sounds: Normal heart sounds. No murmur heard. Pulmonary:     Effort: Pulmonary effort is normal. No respiratory distress.     Breath sounds: Normal breath sounds.  Abdominal:     General: There is no distension.     Palpations: Abdomen is soft.     Tenderness: There is no abdominal tenderness.  Musculoskeletal:     Cervical back: Normal range of motion and neck supple.     Right lower leg: No edema.     Left lower leg: No edema.  Lymphadenopathy:     Cervical: No cervical adenopathy.  Skin:    General: Skin is warm and dry.  Neurological:     Mental Status: She is alert and oriented to person, place, and time.  Psychiatric:        Behavior: Behavior normal.           Assessment & Plan:

## 2023-05-03 NOTE — Patient Instructions (Signed)
Follow up in 3-4 months to recheck sugar, BP, and cholesterol We'll notify you of your lab results and make any changes if needed Keep up the good work on healthy diet and regular physical activity- you look great! Call with any questions or concerns Stay Safe!  Stay Healthy! Hang in there! Happy New Year (and Happy Early [place] Ouch!!!)
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
## 2023-05-04 ENCOUNTER — Telehealth: Payer: Self-pay

## 2023-05-04 NOTE — Telephone Encounter (Signed)
Pt has been notified.

## 2023-05-06 ENCOUNTER — Encounter: Payer: Self-pay | Admitting: Neurology

## 2023-05-06 ENCOUNTER — Ambulatory Visit: Payer: Medicare HMO | Admitting: Neurology

## 2023-05-06 VITALS — BP 144/60 | HR 60 | Ht 62.0 in | Wt 205.2 lb

## 2023-05-06 DIAGNOSIS — G253 Myoclonus: Secondary | ICD-10-CM

## 2023-05-06 DIAGNOSIS — F17210 Nicotine dependence, cigarettes, uncomplicated: Secondary | ICD-10-CM

## 2023-05-06 DIAGNOSIS — Z8673 Personal history of transient ischemic attack (TIA), and cerebral infarction without residual deficits: Secondary | ICD-10-CM | POA: Diagnosis not present

## 2023-05-06 DIAGNOSIS — R251 Tremor, unspecified: Secondary | ICD-10-CM

## 2023-05-06 NOTE — Patient Instructions (Signed)

## 2023-05-13 DIAGNOSIS — N200 Calculus of kidney: Secondary | ICD-10-CM | POA: Diagnosis not present

## 2023-05-17 ENCOUNTER — Other Ambulatory Visit: Payer: Self-pay | Admitting: Family Medicine

## 2023-05-19 ENCOUNTER — Ambulatory Visit (HOSPITAL_BASED_OUTPATIENT_CLINIC_OR_DEPARTMENT_OTHER): Payer: Medicare HMO

## 2023-05-19 ENCOUNTER — Ambulatory Visit (HOSPITAL_BASED_OUTPATIENT_CLINIC_OR_DEPARTMENT_OTHER): Payer: Medicare HMO | Admitting: Orthopaedic Surgery

## 2023-05-19 DIAGNOSIS — Z471 Aftercare following joint replacement surgery: Secondary | ICD-10-CM | POA: Diagnosis not present

## 2023-05-19 DIAGNOSIS — Z96611 Presence of right artificial shoulder joint: Secondary | ICD-10-CM

## 2023-05-19 DIAGNOSIS — M19011 Primary osteoarthritis, right shoulder: Secondary | ICD-10-CM | POA: Diagnosis not present

## 2023-05-19 NOTE — Progress Notes (Signed)
 Post Operative Evaluation    Procedure/Date of Surgery: Right shoulder reverse shoulder arthroplasty July 12, 2022  Interval History:    Presents today for follow-up of her right shoulder and wrist.  Her overhead range of motion is much improved since her most recent accident although she is still having some soreness around the humerus   PMH/PSH/Family History/Social History/Meds/Allergies:    Past Medical History:  Diagnosis Date   Adenomatous colon polyp    hyperplastic   Anemia    Anginal pain (HCC)    not recent   Arthritis    NECK, KNEES, FINGERS, TOES   Atrial tachycardia (HCC) CARDIOLOGIST - DR Rodolfo Clan (LAST VISIT AUG 2012)   Echo 12/11: EF 55-60%, Mild LVH, grade 1 diast dysfxn;   holter 1/12: ATach   Atypical chest pain    a. 07/2004 Cath: Clean cors;  b. 04/2010 Myoview : EF 63%, no ischemia   Blood transfusion without reported diagnosis 1969   CHF (congestive heart failure) (HCC)    Chronic kidney disease    left kidney small    Coronary artery disease    Diabetes mellitus, type 2 (HCC)    ORAL AND INSULIN  MEDS (LAST A1C  7.3)   Diverticulosis    Erythema CURRENT-- CLOSED ABD. WALL ABSCESS   PER PCP NOTE (03-31-11)- MRSA-- TAKES DOXYCYCLINE    GERD (gastroesophageal reflux disease)    CONTROLLED W/ PREVACID   History of kidney stones 2012   Hyperlipidemia    Hypertension    Insomnia    TAKES MEDS PRN   Neuropathy, peripheral    Obesity (BMI 30-39.9) 02/19/2015   Pneumonia    as child   Post op infection 11/07/2012   left bunionectomy   Restless leg syndrome    Sepsis, urinary HISTORY - 2004   Sleep apnea    no cpap    Thyroid  disease    TIA (transient ischemic attack)    "mini strokes" 2023   Tremor    Vitamin D  deficiency    Past Surgical History:  Procedure Laterality Date   ABDOMINAL HYSTERECTOMY  1995   ovaries remain   BUNIONECTOMY Left 10/24/2012   BUNIONECTOMY Right 05/17/2017   CARDIAC  CATHETERIZATION  07/10/2004   CARPAL TUNNEL RELEASE  2000   RIGHT   CATARACT EXTRACTION, BILATERAL     CERVICAL FUSION  10/12/2007   C5 - 7   COLONOSCOPY     CYSTOSCOPY WITH RETROGRADE PYELOGRAM, URETEROSCOPY AND STENT PLACEMENT Left 11/28/2012   Procedure: LEFT RETROGRADE PYELOGRAM, LEFT URETEROSCOPY, ;  Surgeon: Mark C Ottelin, MD;  Location: [facility];  Service: Urology;  Laterality: Left;   CYSTOSCOPY/RETROGRADE/URETEROSCOPY/STONE EXTRACTION WITH BASKET  X2 2004 & 2009   LEFT    GASTRIC BYPASS  1981   KNEE ARTHROSCOPY  12/2010   LEFT   LAPAROSCOPIC CHOLECYSTECTOMY  2001   LEFT HEART CATH AND CORONARY ANGIOGRAPHY N/A 12/03/2021   Procedure: LEFT HEART CATH AND CORONARY ANGIOGRAPHY;  Surgeon: Lucendia Rusk, MD;  Location: [facility] INVASIVE CV [facility];  Service: Cardiovascular;  Laterality: N/A;   left thumb joint surgery  2013   PERCUTANEOUS NEPHROSTOLITHOTOMY  02/27/2011   LEFT   POLYPECTOMY     REVERSE SHOULDER ARTHROPLASTY Right 07/12/2022   Procedure: REVERSE SHOULDER ARTHROPLASTY;  Surgeon: Wilhelmenia Harada, MD;  Location: [facility];  Service: Orthopedics;  Laterality: Right;   RIGHT THUMB JOINT SURG.  09/2009   TRIGGER FINGER RELEASE  2010   RIGHT THUMB   UPPER GASTROINTESTINAL ENDOSCOPY     URETERAL STENT PLACEMENT  X2  2004  &  2009   LEFT   URETEROSCOPY  04/06/2011   Procedure: URETEROSCOPY;  Surgeon: Alanson Alliance, MD;  Location: [facility];  Service: Urology;  Laterality: Left;  L Ureteroscopy Laser Litho & Stent    Social History   Socioeconomic History   Marital status: Married    Spouse name: Not on file   Number of children: 2   Years of education: 12   Highest education level: 12th grade  Occupational History   Occupation: Educational psychologist middle school    Employer: [employer]    Comment: in office   Occupation: Retired  Tobacco Use   Smoking status: Every Day    Current packs/day: 2.00    Average packs/day: 2.0 packs/day for 43.0 years (86.0 ttl  pk-yrs)    Types: Cigarettes   Smokeless tobacco: Never   Tobacco comments:    1/2 ppd 12/29/21  Vaping Use   Vaping status: Never Used  Substance and Sexual Activity   Alcohol use: No    Alcohol/week: 0.0 standard drinks of alcohol   Drug use: No   Sexual activity: Not Currently  Other Topics Concern   Not on file  Social History Narrative   2 children, 2 stepchildren   Lives with husband.   Right-handed.   No daily caffeine.   Social Drivers of Corporate investment banker Strain: Low Risk  (04/29/2023)   Overall Financial Resource Strain (CARDIA)    Difficulty of Paying Living Expenses: Not hard at all  Food Insecurity: No Food Insecurity (04/29/2023)   Hunger Vital Sign    Worried About Running Out of Food in the Last Year: Never true    Ran Out of Food in the Last Year: Never true  Transportation Needs: No Transportation Needs (04/29/2023)   PRAPARE - Administrator, Civil Service (Medical): No    Lack of Transportation (Non-Medical): No  Physical Activity: Insufficiently Active (04/29/2023)   Exercise Vital Sign    Days of Exercise per Week: 1 day    Minutes of Exercise per Session: 20 min  Stress: Stress Concern Present (04/29/2023)   [employer] of Occupational Health - Occupational Stress Questionnaire    Feeling of Stress : To some extent  Social Connections: Moderately Integrated (04/29/2023)   Social Connection and Isolation Panel [NHANES]    Frequency of Communication with Friends and Family: More than three times a week    Frequency of Social Gatherings with Friends and Family: More than three times a week    Attends Religious Services: More than 4 times per year    Active Member of [employer] or Organizations: No    Attends Banker Meetings: Never    Marital Status: Married   Family History  Problem Relation Age of Onset   Hyperlipidemia Mother    Hypertension Mother    Heart attack Father    Lung disease Father    Heart  disease Sister    Hypertension Sister    Colon polyps Sister    Hypertension Sister    Colon polyps Sister    Hypertension Sister    Hypertension Sister    Lung cancer Sister 56       stage 4    Cancer - Other  Daughter    Diabetes Other    Breast cancer Other    Heart disease Other    Colon cancer Other 27       nephew   Esophageal cancer Neg Hx    Rectal cancer Neg Hx    Stomach cancer Neg Hx    Amblyopia Neg Hx    Blindness Neg Hx    Cataracts Neg Hx    Glaucoma Neg Hx    Retinal detachment Neg Hx    Strabismus Neg Hx    Retinitis pigmentosa Neg Hx    No Known Allergies Current Outpatient Medications  Medication Sig Dispense Refill   Alcohol Swabs (DROPSAFE ALCOHOL PREP) 70 % PADS USE AS DIRECTED  AS NEEDED. 200 each 3   aspirin  EC 81 MG tablet Take 1 tablet (81 mg total) by mouth daily. 30 tablet 11   atorvastatin  (LIPITOR) 20 MG tablet TAKE 1 TABLET BY MOUTH EVERY DAY 90 tablet 1   B-D ULTRAFINE III SHORT PEN 31G X 8 MM MISC USE 4 TIMES A DAY 400 each 6   Blood Glucose Monitoring Suppl (TRUE METRIX METER) w/Device KIT Use as directed 3-4 times daily.  Dx- type 2 diabetes w/ use of insulin  1 kit 1   calcium -vitamin D  (OSCAL WITH D) 500-5 MG-MCG tablet Take 1 tablet by mouth.     clonazePAM  (KLONOPIN ) 1 MG tablet TAKE 1 TABLET AT BEDTIME FOR RESTLESS LEGS SYNDROME 30 tablet 3   diclofenac  Sodium (VOLTAREN ) 1 % GEL APPLY 2 GRAMS TO AFFECTED AREA 4 TIMES A DAY (Patient taking differently: Apply 2 g topically 4 (four) times daily as needed (for pain- hands and knees).) 300 g 1   diclofenac  Sodium (VOLTAREN ) 1 % GEL Apply 4 g topically 4 (four) times daily. 100 g 0   fenofibrate  160 MG tablet Take 1 tablet (160 mg total) by mouth daily. 90 tablet 10   furosemide  (LASIX ) 20 MG tablet TAKE 1 TABLET EVERY DAY 90 tablet 1   gabapentin  (NEURONTIN ) 300 MG capsule Take 900 mg by mouth 2 (two) times daily.     glucose blood test strip 1 each by Other route 4 (four) times daily - after  meals and at bedtime. Use as instructed 400 each 3   insulin  aspart (NOVOLOG  FLEXPEN) 100 UNIT/ML FlexPen INJECT 25 UNITS SUBCUTANEOUSLY THREE TIMES DAILY WITH MEALS 75 mL 4   insulin  glargine (LANTUS  SOLOSTAR) 100 UNIT/ML Solostar Pen INJECT 60 UNITS UNDER THE SKIN AT BEDTIME (Patient taking differently: Inject 30 Units into the skin at bedtime.) 60 mL 10   Lancets Super Thin 28G MISC Please use as directed to test sugars 4 times daily. Dx. E11.9 420 each 3   lansoprazole (PREVACID) 15 MG capsule Take 15 mg by mouth daily before breakfast.     levothyroxine  (SYNTHROID ) 50 MCG tablet TAKE 1 TABLET BY MOUTH EVERY DAY BEFORE BREAKFAST 90 tablet 1   loperamide (IMODIUM A-D) 2 MG tablet Take 2 mg by mouth daily as needed for diarrhea or loose stools.     nitrofurantoin , macrocrystal-monohydrate, (MACROBID ) 100 MG capsule Take 100 mg by mouth at bedtime.     OneTouch UltraSoft 2 Lancets MISC 1 Device by Does not apply route 4 (four) times daily. 100 each 3   primidone  (MYSOLINE ) 50 MG tablet Take 2 tablets (100 mg total) by mouth 2 (two) times daily. 360 tablet 1   sertraline  (ZOLOFT ) 100 MG tablet Take 1 tablet (100 mg total) by mouth daily. 90 tablet 3  traZODone  (DESYREL ) 100 MG tablet Take 1 tablet (100 mg total) by mouth at bedtime. 90 tablet 3   valsartan  (DIOVAN ) 320 MG tablet TAKE 1 TABLET DAILY 90 tablet 2   verapamil  (CALAN -SR) 120 MG CR tablet TAKE 1 TABLET BY MOUTH EVERYDAY AT BEDTIME 90 tablet 1   Vibegron (GEMTESA) 75 MG TABS Take 1 tablet by mouth daily.     No current facility-administered medications for this visit.   No results found.  Review of Systems:   A ROS was performed including pertinent positives and negatives as documented in the HPI.   Musculoskeletal Exam:     Right shoulder incision is healed.  Stable to flex and extend at the right elbow.  Active forward elevation is to 80 degrees with external rotation at side of 20 degrees.  Internal rotation is to side.   Sensation is intact in axillary distribution.  Fires EPL as well as wrist extensors.  2+ radial pulse  Imaging:    AP right shoulder: Status post reverse shoulder arthroplasty without evidence of complication  I personally reviewed and interpreted the radiographs.   Assessment:   73 year old female is status post right shoulder reverse shoulder arthroplasty for fracture.  I did describe that while I do see some lucency about the stem overall she is only at 9 months out and I would like to see how she progresses over the course of the next 3 months.  Her overhead motion has improved nicely since her last visit. Plan :    -Return to clinic in 3 months.  I did discuss possible inflammatory labs if she is still having persistent pain      I personally saw and evaluated the patient, and participated in the management and treatment plan.  Wilhelmenia Harada, MD Attending Physician, Orthopedic Surgery  This document was dictated using Dragon voice recognition software. A reasonable attempt at proof reading has been made to minimize errors.

## 2023-05-25 ENCOUNTER — Telehealth: Payer: Self-pay | Admitting: Family Medicine

## 2023-05-25 NOTE — Telephone Encounter (Signed)
This has been retrieved, these type of forms do not need a phone note, nothing needs to be signed

## 2023-05-25 NOTE — Telephone Encounter (Signed)
Type of form received:[facility]   Additional comments: Prescriber Services- Levothyroxine Recall Info   Received ZO:XWRUEAV- Front Desk   Form should be Faxed/mailed to: N/A  Is patient requesting call for pickup:N/A  Form placed: [employer] charge sheet.  Provider will determine charge. N/A  Individual made aware of 3-5 business day turn around No?

## 2023-05-27 DIAGNOSIS — N3020 Other chronic cystitis without hematuria: Secondary | ICD-10-CM | POA: Diagnosis not present

## 2023-06-01 ENCOUNTER — Other Ambulatory Visit: Payer: Self-pay | Admitting: *Deleted

## 2023-06-01 DIAGNOSIS — Z87891 Personal history of nicotine dependence: Secondary | ICD-10-CM

## 2023-06-01 DIAGNOSIS — F17210 Nicotine dependence, cigarettes, uncomplicated: Secondary | ICD-10-CM

## 2023-06-01 DIAGNOSIS — Z122 Encounter for screening for malignant neoplasm of respiratory organs: Secondary | ICD-10-CM

## 2023-06-09 ENCOUNTER — Other Ambulatory Visit: Payer: Self-pay | Admitting: Neurology

## 2023-06-09 DIAGNOSIS — R251 Tremor, unspecified: Secondary | ICD-10-CM

## 2023-06-16 ENCOUNTER — Other Ambulatory Visit: Payer: Self-pay | Admitting: Family Medicine

## 2023-06-16 DIAGNOSIS — G2581 Restless legs syndrome: Secondary | ICD-10-CM

## 2023-06-18 DIAGNOSIS — L821 Other seborrheic keratosis: Secondary | ICD-10-CM | POA: Diagnosis not present

## 2023-06-18 DIAGNOSIS — D225 Melanocytic nevi of trunk: Secondary | ICD-10-CM | POA: Diagnosis not present

## 2023-06-18 DIAGNOSIS — I788 Other diseases of capillaries: Secondary | ICD-10-CM | POA: Diagnosis not present

## 2023-06-18 DIAGNOSIS — D692 Other nonthrombocytopenic purpura: Secondary | ICD-10-CM | POA: Diagnosis not present

## 2023-06-18 DIAGNOSIS — D224 Melanocytic nevi of scalp and neck: Secondary | ICD-10-CM | POA: Diagnosis not present

## 2023-06-18 DIAGNOSIS — L570 Actinic keratosis: Secondary | ICD-10-CM | POA: Diagnosis not present

## 2023-07-01 ENCOUNTER — Encounter: Payer: Self-pay | Admitting: Family Medicine

## 2023-07-01 ENCOUNTER — Telehealth (INDEPENDENT_AMBULATORY_CARE_PROVIDER_SITE_OTHER): Payer: Medicare HMO | Admitting: Family Medicine

## 2023-07-01 DIAGNOSIS — R051 Acute cough: Secondary | ICD-10-CM

## 2023-07-01 MED ORDER — PROMETHAZINE-DM 6.25-15 MG/5ML PO SYRP: 5.0000 mL | ORAL_SOLUTION | Freq: Four times a day (QID) | ORAL | 0 refills | Status: DC | PRN

## 2023-07-01 MED ORDER — DOXYCYCLINE HYCLATE 100 MG PO TABS: 100.0000 mg | ORAL_TABLET | Freq: Two times a day (BID) | ORAL | 0 refills | Status: DC

## 2023-07-01 NOTE — Progress Notes (Signed)
 Virtual Visit via Video   I connected with patient on 07/01/23 at  1:40 PM EST by a video enabled telemedicine application and verified that I am speaking with the correct person using two identifiers.  Location patient: Home Location provider: [place], Office Persons participating in the virtual visit: Patient, Provider, CMA Thea Silversmith H)  I discussed the limitations of evaluation and management by telemedicine and the availability of in person appointments. The patient expressed understanding and agreed to proceed.  Subjective:   HPI:   Cough- sxs started ~1 week ago.  Husband has recently been sick. Cough is worse at night.  Tends to come in spells.  Denies fever/chills.  + HA.  Bilateral ear fullness.  +hoarseness.  Cough is intermittently productive.  Denies sinus pain/pressure.  ROS:   See pertinent positives and negatives per HPI.  Patient Active Problem List   Diagnosis Date Noted   Other abnormalities of gait and mobility 02/19/2023   Type 2 diabetes mellitus with peripheral angiopathy (HCC) 02/19/2023   Anxiety 08/19/2022   Closed 3-part fracture of proximal end of right humerus 07/11/2022   Shoulder dislocation 07/11/2022   Abnormal stress test    Atypical chest pain 08/05/2021   Bradycardia 06/20/2021   Abnormal gait 03/17/2021   Acquired deformity of lower leg 03/17/2021   Urinary urgency 11/14/2020   Paresthesia 08/15/2020   Right lumbar pain 08/15/2020   Tremor 08/15/2020   Hypoglycemia associated with diabetes (HCC) 02/29/2020   Essential (primary) hypertension 08/10/2019   Chronic neck pain 06/23/2016   Radicular low back pain 07/12/2015   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 without macular edema (HCC) 04/24/2015   Severe obesity (BMI >= 40) (HCC) 02/19/2015   OSA (obstructive sleep apnea) 10/23/2014   Excessive daytime sleepiness 10/23/2014   Hypothyroidism 09/07/2014   Tobacco abuse  01/15/2014   Recurrent UTI 09/06/2013   Orthostatic hypotension 10/05/2012   General medical examination 06/22/2011   Atrial tachycardia (HCC) 11/25/2010   Chronic diarrhea 08/25/2010   KNEE PAIN 07/14/2010   Knee pain 07/14/2010   DIASTOLIC HEART FAILURE, CHRONIC 06/09/2010   History of colonic polyps 05/01/2010   Vitamin D deficiency 01/20/2010   RESTLESS LEG SYNDROME 01/20/2010   Insomnia 01/20/2010   Restless leg syndrome 01/20/2010   Hyperlipidemia associated with type 2 diabetes mellitus (HCC) 01/08/2009   Anemia 01/08/2009   GERD 01/08/2009   Benign hypertensive heart disease with heart failure (HCC) 01/08/2009    Social History   Tobacco Use   Smoking status: Every Day    Current packs/day: 2.00    Average packs/day: 2.0 packs/day for 43.0 years (86.0 ttl pk-yrs)    Types: Cigarettes   Smokeless tobacco: Never   Tobacco comments:    1/2 ppd 12/29/21  Substance Use Topics   Alcohol use: No    Alcohol/week: 0.0 standard drinks of alcohol    Current Outpatient Medications:    aspirin EC 81 MG tablet, Take 1 tablet (81 mg total) by mouth daily., Disp: 30 tablet, Rfl: 11   atorvastatin (LIPITOR) 20 MG tablet, TAKE 1 TABLET BY MOUTH EVERY DAY, Disp: 90 tablet, Rfl: 1   B-D ULTRAFINE III SHORT PEN 31G X 8 MM MISC, USE 4 TIMES A DAY, Disp: 400 each, Rfl: 6   calcium-vitamin D (OSCAL WITH D) 500-5 MG-MCG tablet, Take 1 tablet by mouth., Disp: , Rfl:    clonazePAM (KLONOPIN) 1 MG tablet, TAKE 1 TABLET AT BEDTIME FOR RESTLESS LEGS  SYNDROME, Disp: 30 tablet, Rfl: 3   diclofenac Sodium (VOLTAREN) 1 % GEL, APPLY 2 GRAMS TO AFFECTED AREA 4 TIMES A DAY (Patient taking differently: Apply 2 g topically 4 (four) times daily as needed (for pain- hands and knees).), Disp: 300 g, Rfl: 1   diclofenac Sodium (VOLTAREN) 1 % GEL, Apply 4 g topically 4 (four) times daily., Disp: 100 g, Rfl: 0   fenofibrate 160 MG tablet, Take 1 tablet (160 mg total) by mouth daily., Disp: 90 tablet, Rfl: 10    furosemide (LASIX) 20 MG tablet, TAKE 1 TABLET EVERY DAY, Disp: 90 tablet, Rfl: 1   gabapentin (NEURONTIN) 300 MG capsule, Take 900 mg by mouth 2 (two) times daily., Disp: , Rfl:    glucose blood test strip, 1 each by Other route 4 (four) times daily - after meals and at bedtime. Use as instructed, Disp: 400 each, Rfl: 3   insulin aspart (NOVOLOG FLEXPEN) 100 UNIT/ML FlexPen, INJECT 25 UNITS SUBCUTANEOUSLY THREE TIMES DAILY WITH MEALS, Disp: 75 mL, Rfl: 4   insulin glargine (LANTUS SOLOSTAR) 100 UNIT/ML Solostar Pen, INJECT 60 UNITS UNDER THE SKIN AT BEDTIME, Disp: 15 mL, Rfl: 3   Lancets Super Thin 28G MISC, Please use as directed to test sugars 4 times daily. Dx. E11.9, Disp: 420 each, Rfl: 3   lansoprazole (PREVACID) 15 MG capsule, Take 15 mg by mouth daily before breakfast., Disp: , Rfl:    levothyroxine (SYNTHROID) 50 MCG tablet, TAKE 1 TABLET BY MOUTH EVERY DAY BEFORE BREAKFAST, Disp: 90 tablet, Rfl: 1   loperamide (IMODIUM A-D) 2 MG tablet, Take 2 mg by mouth daily as needed for diarrhea or loose stools., Disp: , Rfl:    OneTouch UltraSoft 2 Lancets MISC, 1 Device by Does not apply route 4 (four) times daily., Disp: 100 each, Rfl: 3   primidone (MYSOLINE) 50 MG tablet, TAKE 2 TABLETS BY MOUTH 2 TIMES DAILY., Disp: 360 tablet, Rfl: 0   sertraline (ZOLOFT) 100 MG tablet, Take 1 tablet (100 mg total) by mouth daily., Disp: 90 tablet, Rfl: 3   traZODone (DESYREL) 100 MG tablet, Take 1 tablet (100 mg total) by mouth at bedtime., Disp: 90 tablet, Rfl: 3   valsartan (DIOVAN) 320 MG tablet, TAKE 1 TABLET DAILY, Disp: 90 tablet, Rfl: 2   verapamil (CALAN-SR) 120 MG CR tablet, TAKE 1 TABLET BY MOUTH EVERYDAY AT BEDTIME, Disp: 90 tablet, Rfl: 1   Alcohol Swabs (DROPSAFE ALCOHOL PREP) 70 % PADS, USE AS DIRECTED  AS NEEDED., Disp: 200 each, Rfl: 3   Blood Glucose Monitoring Suppl (TRUE METRIX METER) w/Device KIT, Use as directed 3-4 times daily.  Dx- type 2 diabetes w/ use of insulin, Disp: 1 kit, Rfl: 1    nitrofurantoin, macrocrystal-monohydrate, (MACROBID) 100 MG capsule, Take 100 mg by mouth at bedtime. (Patient not taking: Reported on 07/01/2023), Disp: , Rfl:    Vibegron (GEMTESA) 75 MG TABS, Take 1 tablet by mouth daily. (Patient not taking: Reported on 07/01/2023), Disp: , Rfl:   No Known Allergies  Objective:   LMP 06/06/1993  AAOx3, NAD NCAT, EOMI No obvious CN deficits Coloring WNL Pt is able to speak clearly, coherently without shortness of breath or increased work of breathing. Voice is hoarse Thought process is linear.  Mood is appropriate.   Assessment and Plan:   Cough- new.  Pt's husband was recently sick and just started to improve w/ abx.  She has long hx of smoking and DM which puts her at increased risk for  bacterial infxn.  Start Doxycycline.  Cough meds prn.  Reviewed supportive care and red flags that should prompt return.  Pt expressed understanding and is in agreement w/ plan.    Neena Rhymes, MD 07/01/2023

## 2023-07-05 ENCOUNTER — Other Ambulatory Visit: Payer: Self-pay | Admitting: Family Medicine

## 2023-07-05 ENCOUNTER — Ambulatory Visit: Payer: Medicare HMO | Admitting: Cardiovascular Disease

## 2023-07-05 DIAGNOSIS — G4700 Insomnia, unspecified: Secondary | ICD-10-CM

## 2023-07-05 DIAGNOSIS — I10 Essential (primary) hypertension: Secondary | ICD-10-CM

## 2023-07-09 ENCOUNTER — Other Ambulatory Visit: Payer: Self-pay | Admitting: Family Medicine

## 2023-07-09 DIAGNOSIS — Z794 Long term (current) use of insulin: Secondary | ICD-10-CM

## 2023-07-09 MED ORDER — GLUCOSE BLOOD VI STRP
1.0000 | ORAL_STRIP | Freq: Three times a day (TID) | 3 refills | Status: AC
Start: 2023-07-09 — End: ?

## 2023-07-09 NOTE — Telephone Encounter (Signed)
 Copied from CRM [id number]. Topic: Clinical - Medication Refill >> Jul 09, 2023  1:35 PM Orinda Kenner C wrote: Most Recent Primary Care Visit:  Provider: Sheliah Hatch  Department: [facility]  Visit Type: MYCHART VIDEO VISIT  Date: 07/01/2023  Medication: OneTouch Verio test strip test 4 times a day  Has the patient contacted their pharmacy? Yes (Agent: If no, request that the patient contact the pharmacy for the refill. If patient does not wish to contact the pharmacy document the reason why and proceed with request.) (Agent: If yes, when and what did the pharmacy advise?)  Is this the correct pharmacy for this prescription? Yes If no, delete pharmacy and type the correct one.  This is the patient's preferred pharmacy:  [phone] [place], [place] - [address] [place] [address] Phone: [phone] Fax: [phone]   Has the prescription been filled recently? No  Is the patient out of the medication? No, will be out soon. Last purchase had to pay out pocket  Has the patient been seen for an appointment in the last year OR does the patient have an upcoming appointment? Yes, 08/02/23  Can we respond through MyChart? No, call back [phone]  Agent: Please be advised that Rx refills may take up to 3 business days. We ask that you follow-up with your pharmacy.

## 2023-07-18 NOTE — Progress Notes (Unsigned)
 Cardiology Office Note:  .   Date:  07/19/2023  ID:  Lacey [name], DOB Nov 26, 1950, MRN [id number] PCP: Sheliah Hatch, MD  [facility] Providers Cardiologist:  Lance Muss, MD Electrophysiologist:  Sherryl Manges, MD  History of Present Illness: .    Chief Complaint  Patient presents with   Follow-up    6 months.    Lacey [name] is a 73 y.o. female with history of CAD who presents for follow-up.   History of Present Illness   Lacey [name] is a 73 year old female with nonobstructive coronary artery disease, hypertension, hyperlipidemia, and diabetes who presents for follow-up.  She has a history of nonobstructive coronary artery disease (CAD) with no symptoms of angina, chest pain, or dyspnea. Her coronary calcium score is elevated, and she continues on aspirin therapy. A heart catheterization approximately two years ago showed no significant blockages but some calcification. She has never had a myocardial infarction but has experienced a transient ischemic attack as per her neurologist.  She has a history of supraventricular tachycardia (SVT) and is on verapamil, with follow-up by an electrophysiologist. No recent episodes of palpitations or arrhythmias have been reported.  Her blood pressure is slightly elevated today, but she reports it is well-controlled at home. She takes valsartan 320 mg daily and verapamil 120 mg daily.  Her LDL cholesterol is 79 mg/dL. She is currently taking atorvastatin 20 mg daily and has no issues with her current medications.  She has type 2 diabetes with a recent A1c of 7.3%.  She has a history of diastolic heart failure and takes Lasix 20 mg as needed for symptom management.  She is retired and previously worked for [employer] She has a long history of smoking, currently smoking about half a pack a day, although she has attempted to quit multiple times. She does not smoke in her house or car. She stays active and is on her feet  most of the time.          Problem List HFpEF CAD -CAC 401 (89th percentile) -LCX 25% 12/03/2021 DM -A1c 7.3 HTN HLD -T chol 156, HDL 51, LDL 79, TG 129 OSA TIA Atrial tachycardia  Tobacco abuse     ROS: All other ROS reviewed and negative. Pertinent positives noted in the HPI.     Studies Reviewed: Marland Kitchen   EKG Interpretation Date/Time:  Monday July 19 2023 08:18:17 EDT Ventricular Rate:  70 PR Interval:  152 QRS Duration:  86 QT Interval:  366 QTC Calculation: 395 R Axis:   47  Text Interpretation: Sinus rhythm with marked sinus arrhythmia Nonspecific T wave abnormality Confirmed by Lennie Odor [phone]) on 07/19/2023 8:18:41 AM   TTE 05/06/2021  1. Left ventricular ejection fraction, by estimation, is 60 to 65%. The  left ventricle has normal function. The left ventricle has no regional  wall motion abnormalities. There is mild left ventricular hypertrophy.  Left ventricular diastolic parameters  are consistent with Grade II diastolic dysfunction (pseudonormalization).   2. Right ventricular systolic function is normal. The right ventricular  size is normal. There is mildly elevated pulmonary artery systolic  pressure.   3. The mitral valve is normal in structure. Trivial mitral valve  regurgitation.   4. The aortic valve was not well visualized. Aortic valve regurgitation  is not visualized.  Physical Exam:   VS:  BP (!) 148/50 (BP Location: Left Arm, Patient Position: Sitting, Cuff Size: Large)   Pulse 74   Ht 5\' 2"  (  1.575 m)   Wt 209 lb (94.8 kg)   LMP 06/06/1993   BMI 38.23 kg/m    Wt Readings from Last 3 Encounters:  07/19/23 209 lb (94.8 kg)  05/06/23 205 lb 3.2 oz (93.1 kg)  05/03/23 205 lb 3.2 oz (93.1 kg)    GEN: Well nourished, well developed in no acute distress NECK: No JVD; No carotid bruits CARDIAC: RRR, no murmurs, rubs, gallops RESPIRATORY:  Clear to auscultation without rales, wheezing or rhonchi  ABDOMEN: Soft, non-tender,  non-distended EXTREMITIES:  No edema; No deformity  ASSESSMENT AND PLAN: .   Assessment and Plan    Nonobstructive Coronary Artery Disease (CAD) Nonobstructive CAD with elevated coronary calcium score. Recent catheterization showed no significant blockages. Aspirin therapy warranted. Increasing atorvastatin expected to further lower LDL cholesterol. - Continue aspirin 81 mg daily. - Increase atorvastatin (Lipitor) to 40 mg daily. LDL goal <70  Atrial Tachycardia SVT controlled with verapamil. Under electrophysiology specialist care. - Continue verapamil 120 mg daily.  Diastolic Heart Failure Managed with Lasix as needed. No acute symptoms reported. - Continue Lasix 20 mg as needed.  Hypertension Blood pressure slightly elevated during visit but well controlled at home with valsartan and verapamil. - Continue valsartan 320 mg daily. - Continue verapamil 120 mg daily.  Hyperlipidemia LDL cholesterol at 79 mg/dL. Increasing atorvastatin to achieve target LDL levels. - Increase atorvastatin (Lipitor) to 40 mg daily.  Diabetes Mellitus Type 2 diabetes with A1c of 7.3%, indicating fair control.  Tobacco Use Disorder Smokes approximately half a pack per day. Encouraged cessation efforts. - Encourage smoking cessation efforts. - 3 min smoking cessation counseling provided              Follow-up: Return in about 1 year (around 07/18/2024).   Signed, Lenna Gilford. Flora Lipps, MD, [facility]  [facility]  [address], [address] [place], [place] [address] [phone]  9:40 AM

## 2023-07-19 ENCOUNTER — Ambulatory Visit: Payer: Medicare HMO | Attending: Cardiovascular Disease | Admitting: Cardiovascular Disease

## 2023-07-19 ENCOUNTER — Encounter: Payer: Self-pay | Admitting: Cardiovascular Disease

## 2023-07-19 VITALS — BP 148/50 | HR 74 | Ht 62.0 in | Wt 209.0 lb

## 2023-07-19 DIAGNOSIS — I10 Essential (primary) hypertension: Secondary | ICD-10-CM

## 2023-07-19 DIAGNOSIS — I5032 Chronic diastolic (congestive) heart failure: Secondary | ICD-10-CM | POA: Diagnosis not present

## 2023-07-19 DIAGNOSIS — E782 Mixed hyperlipidemia: Secondary | ICD-10-CM

## 2023-07-19 DIAGNOSIS — I2510 Atherosclerotic heart disease of native coronary artery without angina pectoris: Secondary | ICD-10-CM | POA: Diagnosis not present

## 2023-07-19 DIAGNOSIS — I4719 Other supraventricular tachycardia: Secondary | ICD-10-CM

## 2023-07-19 MED ORDER — ATORVASTATIN CALCIUM 40 MG PO TABS: 40.0000 mg | ORAL_TABLET | Freq: Every day | ORAL | 3 refills | Status: AC

## 2023-07-19 NOTE — Patient Instructions (Signed)
 Medication Instructions:  - START LIPITOR 40MG  DAILY    *If you need a refill on your cardiac medications before your next appointment, please call your pharmacy*   Lab Work: NONE    If you have labs (blood work) drawn today and your tests are completely normal, you will receive your results only by: MyChart Message (if you have MyChart) OR A paper copy in the mail If you have any lab test that is abnormal or we need to change your treatment, we will call you to review the results.   Testing/Procedures: NONE    Follow-Up: At [facility], you and your health needs are our priority.  As part of our continuing mission to provide you with exceptional heart care, we have created designated Provider Care Teams.  These Care Teams include your primary Cardiologist (physician) and Advanced Practice Providers (APPs -  Physician Assistants and Nurse Practitioners) who all work together to provide you with the care you need, when you need it.  We recommend signing up for the patient portal called "MyChart".  Sign up information is provided on this After Visit Summary.  MyChart is used to connect with patients for Virtual Visits (Telemedicine).  Patients are able to view lab/test results, encounter notes, upcoming appointments, etc.  Non-urgent messages can be sent to your provider as well.   To learn more about what you can do with MyChart, go to [email or web address].    Your next appointment:   1 year(s)  The format for your next appointment:   In Person  Provider:   Edd Fabian, FNP, Micah Flesher, PA-C, Marjie Skiff, PA-C, Robet Leu, PA-C, Juanda Crumble, PA-C, Joni Reining, DNP, ANP, Azalee Course, PA-C, Bernadene Person, NP, or Reather Littler, NP      Other Instructions

## 2023-07-20 ENCOUNTER — Other Ambulatory Visit: Payer: Self-pay | Admitting: Family Medicine

## 2023-08-02 ENCOUNTER — Telehealth: Payer: Self-pay

## 2023-08-02 ENCOUNTER — Encounter: Payer: Self-pay | Admitting: Family Medicine

## 2023-08-02 ENCOUNTER — Ambulatory Visit (INDEPENDENT_AMBULATORY_CARE_PROVIDER_SITE_OTHER): Payer: Medicare HMO | Admitting: Family Medicine

## 2023-08-02 VITALS — BP 140/48 | HR 56 | Temp 98.0°F | Ht 62.0 in | Wt 209.1 lb

## 2023-08-02 DIAGNOSIS — E113391 Type 2 diabetes mellitus with moderate nonproliferative diabetic retinopathy without macular edema, right eye: Secondary | ICD-10-CM | POA: Diagnosis not present

## 2023-08-02 DIAGNOSIS — Z794 Long term (current) use of insulin: Secondary | ICD-10-CM

## 2023-08-02 DIAGNOSIS — I10 Essential (primary) hypertension: Secondary | ICD-10-CM | POA: Diagnosis not present

## 2023-08-02 DIAGNOSIS — E1169 Type 2 diabetes mellitus with other specified complication: Secondary | ICD-10-CM | POA: Diagnosis not present

## 2023-08-02 DIAGNOSIS — E785 Hyperlipidemia, unspecified: Secondary | ICD-10-CM

## 2023-08-02 LAB — CBC WITH DIFFERENTIAL/PLATELET
Basophils Absolute: 0.1 10*3/uL (ref 0.0–0.1)
Basophils Relative: 0.7 % (ref 0.0–3.0)
Eosinophils Absolute: 0.2 10*3/uL (ref 0.0–0.7)
Eosinophils Relative: 2.1 % (ref 0.0–5.0)
HCT: 40.8 % (ref 36.0–46.0)
Hemoglobin: 13.5 g/dL (ref 12.0–15.0)
Lymphocytes Relative: 26.8 % (ref 12.0–46.0)
Lymphs Abs: 2.2 10*3/uL (ref 0.7–4.0)
MCHC: 33.1 g/dL (ref 30.0–36.0)
MCV: 96.9 fl (ref 78.0–100.0)
Monocytes Absolute: 0.7 10*3/uL (ref 0.1–1.0)
Monocytes Relative: 8.2 % (ref 3.0–12.0)
Neutro Abs: 5.1 10*3/uL (ref 1.4–7.7)
Neutrophils Relative %: 62.2 % (ref 43.0–77.0)
Platelets: 178 10*3/uL (ref 150.0–400.0)
RBC: 4.21 Mil/uL (ref 3.87–5.11)
RDW: 14.4 % (ref 11.5–15.5)
WBC: 8.2 10*3/uL (ref 4.0–10.5)

## 2023-08-02 LAB — LIPID PANEL
Cholesterol: 159 mg/dL (ref 0–200)
HDL: 63 mg/dL (ref >39.00)
LDL Cholesterol: 79 mg/dL (ref 0–99)
NonHDL: 95.68
Total CHOL/HDL Ratio: 3
Triglycerides: 84 mg/dL (ref 0.0–149.0)
VLDL: 16.8 mg/dL (ref 0.0–40.0)

## 2023-08-02 LAB — HEPATIC FUNCTION PANEL
ALT: 10 U/L (ref 0–35)
AST: 17 U/L (ref 0–37)
Albumin: 4.1 g/dL (ref 3.5–5.2)
Alkaline Phosphatase: 55 U/L (ref 39–117)
Bilirubin, Direct: 0.1 mg/dL (ref 0.0–0.3)
Total Bilirubin: 0.3 mg/dL (ref 0.2–1.2)
Total Protein: 7 g/dL (ref 6.0–8.3)

## 2023-08-02 LAB — BASIC METABOLIC PANEL WITH GFR
BUN: 17 mg/dL (ref 6–23)
CO2: 29 meq/L (ref 19–32)
Calcium: 9.1 mg/dL (ref 8.4–10.5)
Chloride: 103 meq/L (ref 96–112)
Creatinine, Ser: 0.83 mg/dL (ref 0.40–1.20)
GFR: 70.03 mL/min (ref >60.00)
Glucose, Bld: 32 mg/dL — CL (ref 70–99)
Potassium: 4 meq/L (ref 3.5–5.1)
Sodium: 141 meq/L (ref 135–145)

## 2023-08-02 LAB — HEMOGLOBIN A1C: Hgb A1c MFr Bld: 7.3 % — ABNORMAL HIGH (ref 4.6–6.5)

## 2023-08-02 LAB — TSH: TSH: 2.45 u[IU]/mL (ref 0.35–5.50)

## 2023-08-02 NOTE — Assessment & Plan Note (Addendum)
 Chronic problem.  On Valsartan 320mg  daily and Verapamil 120mg  daily w/ adequate control.  Currently asymptomatic. Check labs due to ARB but no anticipated med changes.

## 2023-08-02 NOTE — Assessment & Plan Note (Signed)
 Chronic problem.  Currently on Lipitor 40mg  daily and Fenofibrate 160mg  daily w/o difficulty.  Check labs.  Adjust meds prn

## 2023-08-02 NOTE — Patient Instructions (Signed)
 Follow up in 3-4 months to recheck sugar We'll notify you of your lab results and make any changes if needed Continue to work on healthy diet and regular physical activity- you can do it! Take time to grieve!  You are entitled to feel whatever you're feeling Call with any questions or concerns Hang in there!!!

## 2023-08-02 NOTE — Telephone Encounter (Signed)
 Critical lab noted.  Office visit with Dr. Beverely Low today reviewed.  Insulin-dependent diabetic and did report some episodic lows.  On 60 units Lantus with NovoLog 25 units 3 times daily.    Called patient. She could tell she was running low here today - felt typical symptoms of low blood sugar  -  weird feeling in pit of stomach. Had orange juice and a brownie when she got out to her car and then started to improve quickly.  She had a reading of 110 this morning before taking her novolog. She did eat breakfast today, but less than usual so that her sugar would not run high in the office.  Even though she ate less for breakfast she had taken her full dose of NovoLog this morning.  Has been feeling ok this afternoon. Current blood sugar 240 - just took novolog 25 units and about to eat dinner.    No med changes at this time.  ER precautions given if any return of hypoglycemia but I suspect that symptoms this morning were due to full-strength of her mealtime insulin with smaller meal.  Any further med adjustments per PCP -will forward this note to Dr. Beverely Low.  I did not change anything at this time.  She is feeling well now.  Frequent monitoring over the next few days recommended.

## 2023-08-02 NOTE — Progress Notes (Signed)
   Subjective:    Patient ID: Lacey [name], female    DOB: May 14, 1950, 73 y.o.   MRN: [id number]  HPI DM- chronic problem, on Lantus 60units nightly and Novolog 25units TID.  UTD on eye exam, foot exam, microalbumin.  No numbness/tingling of hands/feet.  Has had a few symptomatic lows  HTN- chronic problem, on Valsartan 320mg  daily and Verapamil 120mg  daily.  No CP, SOB above baseline, HA's,.  Hyperlipidemia- chronic problem, on Lipitor 40mg  daily and Fenofibrate 160mg  daily.  No abd pain, N/V.   Review of Systems For ROS see HPI     Objective:   Physical Exam Vitals reviewed.  Constitutional:      General: She is not in acute distress.    Appearance: She is well-developed. She is obese. She is not ill-appearing.  HENT:     Head: Normocephalic and atraumatic.  Eyes:     Conjunctiva/sclera: Conjunctivae normal.     Pupils: Pupils are equal, round, and reactive to light.  Neck:     Thyroid: No thyromegaly.  Cardiovascular:     Rate and Rhythm: Normal rate and regular rhythm.     Pulses: Normal pulses.     Heart sounds: Normal heart sounds. No murmur heard. Pulmonary:     Effort: Pulmonary effort is normal. No respiratory distress.     Breath sounds: Normal breath sounds.  Abdominal:     General: There is no distension.     Palpations: Abdomen is soft.     Tenderness: There is no abdominal tenderness.  Musculoskeletal:     Cervical back: Normal range of motion and neck supple.     Right lower leg: Edema (trace) present.     Left lower leg: Edema (trace) present.  Lymphadenopathy:     Cervical: No cervical adenopathy.  Skin:    General: Skin is warm and dry.  Neurological:     General: No focal deficit present.     Mental Status: She is alert and oriented to person, place, and time.  Psychiatric:        Behavior: Behavior normal.           Assessment & Plan:

## 2023-08-02 NOTE — Assessment & Plan Note (Addendum)
 Chronic problem.  Currently on Lantus 60 units nightly and Novolog 25 units TID.  Currently asymptomatic w/ exception of a few symptomatic lows that she is able to recognize and self correct.  UTD on eye exam, foot exam, microalbumin.  Check labs.  Adjust meds prn

## 2023-08-02 NOTE — Telephone Encounter (Signed)
 Call from [facility] at [place] critical lab for glucose reading of 32 with a repeat reading of still 32 at 11:16am. Did talk verbally with Dr.Greene about sending him a critical lab due to Dr.Tabori not being in office but I will still send to Dr.Tabori so she is aware.

## 2023-08-03 NOTE — Telephone Encounter (Signed)
 Lab results have been discussed.   Verbalized understanding? Yes  Are there any questions? No   Patient states she was in a rush yesterday to get to her appointment and did not each much. Patient states she feels much better this morning and did have some juice after the lab draw yesterday to help bring her glucose back up. Checked glucose this morning and was 114.

## 2023-08-03 NOTE — Telephone Encounter (Signed)
-----   Message from Neena Rhymes sent at 08/02/2023  6:18 PM EDT ----- Labs are stable and look great w/ exception of low glucose (32!)  Please make sure you are eating regularly and if you are eating regularly, we may need to decrease your insulin dose.

## 2023-08-09 ENCOUNTER — Emergency Department (HOSPITAL_COMMUNITY)
Admission: EM | Admit: 2023-08-09 | Discharge: 2023-08-10 | Disposition: A | Discharge status: Home / Self Care | Attending: Emergency Medicine | Admitting: Emergency Medicine

## 2023-08-09 ENCOUNTER — Telehealth: Payer: Self-pay | Admitting: Neurology

## 2023-08-09 ENCOUNTER — Emergency Department (HOSPITAL_COMMUNITY)

## 2023-08-09 ENCOUNTER — Ambulatory Visit: Payer: Self-pay

## 2023-08-09 DIAGNOSIS — Z7984 Long term (current) use of oral hypoglycemic drugs: Secondary | ICD-10-CM | POA: Diagnosis not present

## 2023-08-09 DIAGNOSIS — Z7982 Long term (current) use of aspirin: Secondary | ICD-10-CM | POA: Diagnosis not present

## 2023-08-09 DIAGNOSIS — Z5329 Procedure and treatment not carried out because of patient's decision for other reasons: Secondary | ICD-10-CM | POA: Diagnosis not present

## 2023-08-09 DIAGNOSIS — Z79899 Other long term (current) drug therapy: Secondary | ICD-10-CM | POA: Diagnosis not present

## 2023-08-09 DIAGNOSIS — I6523 Occlusion and stenosis of bilateral carotid arteries: Secondary | ICD-10-CM | POA: Diagnosis not present

## 2023-08-09 DIAGNOSIS — I10 Essential (primary) hypertension: Secondary | ICD-10-CM | POA: Diagnosis not present

## 2023-08-09 DIAGNOSIS — R531 Weakness: Secondary | ICD-10-CM | POA: Diagnosis not present

## 2023-08-09 DIAGNOSIS — E119 Type 2 diabetes mellitus without complications: Secondary | ICD-10-CM | POA: Insufficient documentation

## 2023-08-09 DIAGNOSIS — R202 Paresthesia of skin: Secondary | ICD-10-CM | POA: Diagnosis not present

## 2023-08-09 DIAGNOSIS — R29818 Other symptoms and signs involving the nervous system: Secondary | ICD-10-CM | POA: Diagnosis not present

## 2023-08-09 DIAGNOSIS — I6782 Cerebral ischemia: Secondary | ICD-10-CM | POA: Diagnosis not present

## 2023-08-09 DIAGNOSIS — R001 Bradycardia, unspecified: Secondary | ICD-10-CM | POA: Diagnosis not present

## 2023-08-09 DIAGNOSIS — Z794 Long term (current) use of insulin: Secondary | ICD-10-CM | POA: Insufficient documentation

## 2023-08-09 LAB — CBC
HCT: 39.8 % (ref 36.0–46.0)
Hemoglobin: 13 g/dL (ref 12.0–15.0)
MCH: 31.8 pg (ref 26.0–34.0)
MCHC: 32.7 g/dL (ref 30.0–36.0)
MCV: 97.3 fL (ref 80.0–100.0)
Platelets: 190 10*3/uL (ref 150–400)
RBC: 4.09 MIL/uL (ref 3.87–5.11)
RDW: 13.7 % (ref 11.5–15.5)
WBC: 8.2 10*3/uL (ref 4.0–10.5)
nRBC: 0 % (ref 0.0–0.2)

## 2023-08-09 LAB — DIFFERENTIAL
Abs Immature Granulocytes: 0.06 10*3/uL (ref 0.00–0.07)
Basophils Absolute: 0.1 10*3/uL (ref 0.0–0.1)
Basophils Relative: 1 %
Eosinophils Absolute: 0.2 10*3/uL (ref 0.0–0.5)
Eosinophils Relative: 3 %
Immature Granulocytes: 1 %
Lymphocytes Relative: 27 %
Lymphs Abs: 2.2 10*3/uL (ref 0.7–4.0)
Monocytes Absolute: 0.6 10*3/uL (ref 0.1–1.0)
Monocytes Relative: 7 %
Neutro Abs: 5 10*3/uL (ref 1.7–7.7)
Neutrophils Relative %: 61 %

## 2023-08-09 LAB — COMPREHENSIVE METABOLIC PANEL WITH GFR
ALT: 9 U/L (ref 0–44)
AST: 16 U/L (ref 15–41)
Albumin: 3.5 g/dL (ref 3.5–5.0)
Alkaline Phosphatase: 48 U/L (ref 38–126)
Anion gap: 10 (ref 5–15)
BUN: 19 mg/dL (ref 8–23)
CO2: 24 mmol/L (ref 22–32)
Calcium: 9.2 mg/dL (ref 8.9–10.3)
Chloride: 105 mmol/L (ref 98–111)
Creatinine, Ser: 0.81 mg/dL (ref 0.44–1.00)
GFR, Estimated: >60 mL/min (ref >60)
Glucose, Bld: 93 mg/dL (ref 70–99)
Potassium: 3.8 mmol/L (ref 3.5–5.1)
Sodium: 139 mmol/L (ref 135–145)
Total Bilirubin: 0.7 mg/dL (ref 0.0–1.2)
Total Protein: 6.2 g/dL — ABNORMAL LOW (ref 6.5–8.1)

## 2023-08-09 LAB — ETHANOL: Alcohol, Ethyl (B): <10 mg/dL (ref <10)

## 2023-08-09 LAB — CBG MONITORING, ED: Glucose-Capillary: 118 mg/dL — ABNORMAL HIGH (ref 70–99)

## 2023-08-09 MED ORDER — SODIUM CHLORIDE 0.9% FLUSH: 3.0000 mL | Freq: Once | INTRAVENOUS | Status: DC

## 2023-08-09 MED ORDER — GADOBUTROL 1 MMOL/ML IV SOLN
9.0000 mL | Freq: Once | INTRAVENOUS | Status: AC | PRN
  Administered 2023-08-09: 9 mL via INTRAVENOUS

## 2023-08-09 NOTE — Telephone Encounter (Signed)
 Pt called in and left a message. She is having trouble with her right arm. She cannot pick up or hold anything in that hand. It started on Saturday.

## 2023-08-09 NOTE — Telephone Encounter (Signed)
  Chief Complaint: right arm numbness/weakness Symptoms: intermittent right arm numbness, right hand weakness Frequency: x 3 days Pertinent Negatives: Patient denies chest pain, difficulty breathing, palpitations, headache, dizziness, changes in speech or vision Disposition: [x] ED /[] Urgent Care (no appt availability in office) / [] Appointment(In office/virtual)/ []  [place]/ [] Home Care/ [x] Refused Recommended Disposition /[]  Mobile Bus/ []  Follow-up with PCP Additional Notes: Patient states she has had surgery on her right shoulder a year ago and it causes a pain from the shoulder to the elbow sometimes. She states since Saturday she was cooking dinner and had sudden onset of numbness and weakness in her right arm. She states the numbness has resolved but her right hand is weak and she can not grab anything. Patient states she would like to call her neurologist instead of going straight to the ED or call 911. Called [facility] and informed staff of patient's decision.  Copied from CRM [id number]. Topic: Clinical - Red Word Triage >> Aug 09, 2023 11:21 AM Drema Balzarine wrote: Red Word that prompted transfer to Nurse Triage: Patient has been feeling numbness in between her wrist and elbow since Saturday and she can't grasp anything with her right hand Reason for Disposition  [1] Weakness (i.e., paralysis, loss of muscle strength) of the face, arm / hand, or leg / foot on one side of the body AND [2] sudden onset AND [3] present now  (Exception: Bell's palsy suspected [i.e., weakness only on one side of the face, developing over hours to days, no other symptoms].)  Answer Assessment - Initial Assessment Questions 1. SYMPTOM: "What is the main symptom you are concerned about?" (e.g., weakness, numbness)     Right arm numbness and weakness.  2. ONSET: "When did this start?" (minutes, hours, days; while sleeping)     Sudden onset Saturday (08/07/23)  around 6pm.  3. LAST NORMAL: "When was  the last time you (the patient) were normal (no symptoms)?"     Just before 08/07/23 6pm.  4. PATTERN "Does this come and go, or has it been constant since it started?"  "Is it present now?"     She states the numbness is gone but the weakness is still there.  5. CARDIAC SYMPTOMS: "Have you had any of the following symptoms: chest pain, difficulty breathing, palpitations?"     Denies.  6. NEUROLOGIC SYMPTOMS: "Have you had any of the following symptoms: headache, dizziness, vision loss, double vision, changes in speech, unsteady on your feet?"     Denies.  7. OTHER SYMPTOMS: "Do you have any other symptoms?"     Right shoulder pain (states she has had a surgery a year ago).  8. PREGNANCY: "Is there any chance you are pregnant?" "When was your last menstrual period?"     N/A.  Protocols used: Neurologic Deficit-A-AH

## 2023-08-09 NOTE — ED Triage Notes (Signed)
 Pt states that on Saturday at 1800 she began noticing weakness and "tinging" in her R arm. Pt states that she has hx of TIA. Not anticoagulated. Pt with noticeable weakness on the R side arm/leg. She also has decreased sensation.

## 2023-08-09 NOTE — Telephone Encounter (Signed)
 Pt called asked what her symptoms where she stated She is having trouble with her right arm. She cannot pick up or hold anything in that hand. She stated that she called her PCP they told her to go to the ER Pt informed that I was calling to tell her that she needs to go to the ER. She said honey I dont want to go and sit, I told her that she needs to go because she may behaving signs of a stroke, pt asked if she has any one that can drive her she said that she was going to call her son to drive he was calling him right now to come and get her. Pt advised to let the hospital know what all of her symptoms are when she gets there she said that she would and that she would be heading to the hospital soon,

## 2023-08-09 NOTE — ED Provider Triage Note (Signed)
 Emergency Medicine Provider Triage Evaluation Note  Lacey [name] , a 73 y.o. female  was evaluated in triage.  Pt complains of RUE numbness and decreased grip strength. Hx of CVA 2023. No thinners  Right fore arm numbness on Saturday 1800. Then had difficulty with gripping this with right hand an hour later.  Called neurologist who recommended ED evaluation  Review of Systems  Positive: See HPI Negative:   Physical Exam  BP (!) 173/58 (BP Location: Right Arm)   Pulse (!) 57   Temp 99.3 F (37.4 C) (Oral)   Resp 16   Ht 5\' 2"  (1.575 m)   Wt 94.8 kg   LMP 06/06/1993   SpO2 96%   BMI 38.23 kg/m  Gen:   Awake, no distress   Resp:  Normal effort  MSK:   Moves extremities without difficulty  Other:  A&Ox3. Weakness in RUE. Decreased grip strength in RUE. Ambulated wo difficulty  Medical Decision Making  Medically screening exam initiated at 3:11 PM.  Appropriate orders placed.  Lacey [name] was informed that the remainder of the evaluation will be completed by another provider, this initial triage assessment does not replace that evaluation, and the importance of remaining in the ED until their evaluation is complete.  Labs and CT ordered   Lacey [name], [place] 08/09/23 [id number]

## 2023-08-09 NOTE — ED Provider Notes (Signed)
 [facility] Provider Note   CSN: [id number] Arrival date & time: 08/09/23  1437     History  Chief Complaint  Patient presents with   Extremity Weakness    Lacey [name] is a 73 y.o. female with past medical history hypertension, hyperlipidemia, diabetes, TIA presents due to right upper extremity weakness.  Patient states on Saturday around 6 PM, she noticed her right arm was less functional than usual.  She was unable to use her right hand with worsening grip strength.  She was also having paresthesias to the right forearm as well.  Weakness has persisted through to today.  Patient denies any trauma or significant neck pain.  Denies any fevers.  Does not take any blood thinners.   Extremity Weakness      Home Medications Prior to Admission medications   Medication Sig Start Date End Date Taking? Authorizing Provider  Alcohol Swabs (DROPSAFE ALCOHOL PREP) 70 % PADS USE AS DIRECTED  AS NEEDED. 08/15/21   Tabori, Katherine E, MD  aspirin  EC 81 MG tablet Take 1 tablet (81 mg total) by mouth daily. 08/13/22   Dahal, Aminta Baldy, MD  atorvastatin  (LIPITOR) 40 MG tablet Take 1 tablet (40 mg total) by mouth daily. 07/19/23   O'Neal, Cathay Clonts, MD  B-D ULTRAFINE III SHORT PEN 31G X 8 MM MISC USE 4 TIMES A DAY 05/17/23   Tabori, Katherine E, MD  Blood Glucose Monitoring Suppl (TRUE METRIX METER) w/Device KIT Use as directed 3-4 times daily.  Dx- type 2 diabetes w/ use of insulin  09/14/22   Tabori, Katherine E, MD  calcium -vitamin D  (OSCAL WITH D) 500-5 MG-MCG tablet Take 1 tablet by mouth.    [provider]  clonazePAM  (KLONOPIN ) 1 MG tablet TAKE 1 TABLET AT BEDTIME FOR RESTLESS LEGS SYNDROME 06/16/23   Tabori, Katherine E, MD  diclofenac  Sodium (VOLTAREN ) 1 % GEL APPLY 2 GRAMS TO AFFECTED AREA 4 TIMES A DAY Patient taking differently: Apply 2 g topically 4 (four) times daily as needed (for pain- hands and knees). 06/06/20   Tabori, Katherine E, MD   fenofibrate  160 MG tablet TAKE 1 TABLET DAILY 07/05/23   Tabori, Katherine E, MD  furosemide  (LASIX ) 20 MG tablet TAKE 1 TABLET EVERY DAY 01/21/22   Tabori, Katherine E, MD  gabapentin  (NEURONTIN ) 300 MG capsule Take 900 mg by mouth 2 (two) times daily.    [provider]  glucose blood test strip 1 each by Other route as needed for other. Test 4 times daily    [provider]  glucose blood test strip 1 each by Other route 4 (four) times daily - after meals and at bedtime. Use as instructed 07/09/23   Tabori, Katherine E, MD  insulin  aspart (NOVOLOG  FLEXPEN) 100 UNIT/ML FlexPen INJECT 25 UNITS SUBCUTANEOUSLY THREE TIMES DAILY WITH MEALS 12/16/22   Tabori, Katherine E, MD  insulin  glargine (LANTUS  SOLOSTAR) 100 UNIT/ML Solostar Pen INJECT 60 UNITS UNDER THE SKIN AT BEDTIME 06/16/23   Tabori, Katherine E, MD  Lancets Super Thin 28G MISC Please use as directed to test sugars 4 times daily. Dx. E11.9 08/14/19   Tabori, Katherine E, MD  lansoprazole (PREVACID) 15 MG capsule Take 15 mg by mouth daily before breakfast.    [provider]  levothyroxine  (SYNTHROID ) 50 MCG tablet TAKE 1 TABLET BY MOUTH EVERY DAY BEFORE BREAKFAST 05/17/23   Tabori, Katherine E, MD  loperamide (IMODIUM A-D) 2 MG tablet Take 2 mg by mouth daily  as needed for diarrhea or loose stools.    [provider]  OneTouch UltraSoft 2 Lancets MISC 1 Device by Does not apply route 4 (four) times daily. 10/02/22   Tabori, Katherine E, MD  primidone  (MYSOLINE ) 50 MG tablet TAKE 2 TABLETS BY MOUTH 2 TIMES DAILY. 06/10/23   Tat, Von Grumbling, DO  promethazine -dextromethorphan (PROMETHAZINE -DM) 6.25-15 MG/5ML syrup Take 5 mLs by mouth 4 (four) times daily as needed. 07/01/23   Tabori, Katherine E, MD  sertraline  (ZOLOFT ) 100 MG tablet Take 1 tablet (100 mg total) by mouth daily. 03/18/23   Tabori, Katherine E, MD  traZODone  (DESYREL ) 100 MG tablet TAKE 1 TABLET AT BEDTIME 07/05/23   Tabori, Katherine E, MD  trimethoprim  (TRIMPEX) 100 MG tablet Take by mouth. 07/16/23   [provider]  valsartan  (DIOVAN ) 320 MG tablet TAKE 1 TABLET DAILY 03/01/23   Tabori, Katherine E, MD  verapamil  (CALAN -SR) 120 MG CR tablet TAKE 1 TABLET BY MOUTH EVERYDAY AT BEDTIME 07/21/23   Tabori, Katherine E, MD      Allergies    Patient has no known allergies.    Review of Systems   Review of Systems  Musculoskeletal:  Positive for extremity weakness.    Physical Exam Updated Vital Signs BP (!) 173/58 (BP Location: Right Arm)   Pulse (!) 57   Temp 99.3 F (37.4 C) (Oral)   Resp 16   Ht 5\' 2"  (1.575 m)   Wt 94.8 kg   LMP 06/06/1993   SpO2 96%   BMI 38.23 kg/m  Physical Exam Vitals and nursing note reviewed.  Constitutional:      General: She is not in acute distress.    Appearance: She is well-developed.  HENT:     Head: Normocephalic and atraumatic.  Eyes:     Conjunctiva/sclera: Conjunctivae normal.  Cardiovascular:     Rate and Rhythm: Normal rate.     Heart sounds: No murmur heard. Pulmonary:     Effort: Pulmonary effort is normal. No respiratory distress.  Abdominal:     Palpations: Abdomen is soft.     Tenderness: There is no abdominal tenderness.  Musculoskeletal:        General: No swelling.     Cervical back: Normal range of motion and neck supple. No rigidity or tenderness.     Comments: 4/5 right arm flexion and extension, 5/5 left arm flexion and extension 4/5 right leg flexion and extension, 5/5 left leg flexion and extension 4/5 right knee flexion and extension, 5/5 left knee flexion and extension  Skin:    General: Skin is warm and dry.     Capillary Refill: Capillary refill takes less than 2 seconds.  Neurological:     Mental Status: She is alert.  Psychiatric:        Mood and Affect: Mood normal.     ED Results / Procedures / Treatments   Labs (all labs ordered are listed, but only abnormal results are displayed) Labs Reviewed  COMPREHENSIVE METABOLIC PANEL WITH GFR -  Abnormal; Notable for the following components:      Result Value   Total Protein 6.2 (*)    All other components within normal limits  CBG MONITORING, ED - Abnormal; Notable for the following components:   Glucose-Capillary 118 (*)    All other components within normal limits  CBC  DIFFERENTIAL  ETHANOL  PROTIME-INR  APTT  I-STAT CHEM 8, ED    EKG None  Radiology CT HEAD WO CONTRAST Result  Date: 08/09/2023 CLINICAL DATA:  Neuro deficit, acute, stroke suspected Non con. Pt endorses right arm numbess/ tingling Saturday that resolved. Feels like she cannot grip with that hand now. EXAM: CT HEAD WITHOUT CONTRAST TECHNIQUE: Contiguous axial images were obtained from the base of the skull through the vertex without intravenous contrast. RADIATION DOSE REDUCTION: This exam was performed according to the departmental dose-optimization program which includes automated exposure control, adjustment of the mA and/or kV according to patient size and/or use of iterative reconstruction technique. COMPARISON:  CT head 02/17/2023 FINDINGS: Brain: No evidence of large-territorial acute infarction. No parenchymal hemorrhage. No mass lesion. No extra-axial collection. No mass effect or midline shift. No hydrocephalus. Basilar cisterns are patent. Vascular: No hyperdense vessel. Atherosclerotic calcifications are present within the cavernous internal carotid arteries. Skull: No acute fracture or focal lesion. Sinuses/Orbits: Paranasal sinuses and mastoid air cells are clear. Bilateral lens replacement. Otherwise the orbits are unremarkable. Other: None. IMPRESSION: No acute intracranial abnormality. Electronically Signed   By: Morgane  Naveau M.D.   On: 08/09/2023 20:45    Procedures Procedures    Medications Ordered in ED Medications  sodium chloride  flush (NS) 0.9 % injection 3 mL (has no administration in time range)  gadobutrol  (GADAVIST ) 1 MMOL/ML injection 9 mL (9 mLs Intravenous Contrast Given 08/09/23  2231)    ED Course/ Medical Decision Making/ A&P                                 Medical Decision Making Amount and/or Complexity of Data Reviewed Labs: ordered. Radiology: ordered.  Risk Prescription drug management.   Patient is alert, afebrile, and hemodynamically stable in no acute distress.  Physical exam as noted above with right hemibody weakness.  Great concern at this time is stroke, out of the window for thrombolytics.  Workup resulted with no leukocytosis, normal hemoglobin, unremarkable CMP, EtOH undetectable.  CT head with no acute intracranial findings.  MRI was completed but pending read at the time.  I had an extensive bedside conversation with the patient.  She would like to leave at this time, endorses waiting for quite some time in the ED today.  We had a discussion about the risks and benefits of leaving, and patient understands that there is risk of disability or death if she leaves.  She understands this risk.  She has decision-making capacity.  She states that she will follow-up with her neurologist, calling them tomorrow morning.  I also sent her neurologist a message via secure chat.  Patient was discharged AGAINST MEDICAL ADVICE, and is aware she can return at any time to complete her workup.        Final Clinical Impression(s) / ED Diagnoses Final diagnoses:  Right sided weakness    Rx / DC Orders ED Discharge Orders     None         Lorain Robson, MD 08/10/23 0981    Guadalupe Lee, MD 08/25/23 1152

## 2023-08-10 ENCOUNTER — Telehealth: Payer: Self-pay | Admitting: Neurology

## 2023-08-10 NOTE — Telephone Encounter (Signed)
 We sent her to ED yesterday for stroke like sx's.  Looks like MRI was positive for stroke but she signed herself out AMA before MRI read.  Please put her in on Thursday for 60 min for acute stroke.

## 2023-08-10 NOTE — Discharge Instructions (Signed)
 You were seen in the ED for right-sided weakness, with workup showing unremarkable CT imaging but MRI still pending at the time of discharge.  You chose to leave AGAINST MEDICAL ADVICE.  Please follow-up with your neurologist.  Thank you for letting us take care of you today

## 2023-08-10 NOTE — Telephone Encounter (Signed)
 Pt is sch for 08-12-23 at 3:00 with Dr [name]

## 2023-08-11 NOTE — Progress Notes (Unsigned)
 Assessment/Plan:    Tremor Inderal LA was effective, but she had significant bradycardia and it had to be discontinued. She did have an abnormal DaTscan, albeit slightly so when I reviewed it.  She did not look parkinsonian today or previous visits Skin biopsy was negative for alpha-synuclein.  This may mean she is at risk for a tauopathy (again, atypical state) but not idiopathic Parkinsons Disease.  However, she does not look like she has an atypical state continue  primidone, 50 mg, 2 in the AM, 2 in the evening.  She is happy with efficacy 2.  History of cerebral infarction, December 2022 and April 2025  -She is on aspirin, 81 mg daily.  Add Plavix for 90 days and then back to aspirin alone.  -Talked about importance of blood pressure control with a goal <130/80 mm Hg.   -Talked about importance of lipid control and proper diet.  Lipids should be managed intensively, with a goal LDL < 70 mg/dL.  She is on Lipitor, 20 mg daily.  -***echo with bubble  -***carotid u/s  -***zio patch  2.   Hx of nephrolithiasis             -Drugs like topiramate and Zonegran would not be able to be used because of this for her tremor.  3. Myoclonus  -better with decrease in gabapentin dose.  4.  Tobacco abuse  -discussed importance of cessation  Subjective:   Lacey [name] was worked into the office today.  Patient called primary care on Monday, August 09, 2023.  She had apparently been cooking dinner 2 days before on Saturday, April 5 and noted acute onset of numbness and weakness in the right arm.  The numbness resolved, but she still had weakness in the right hand and could not pick up things.  She was advised by the primary care call service to go to the emergency room.  Instead, she called our office and received the same advice.  She did subsequently go to the emergency room and had a CT brain that was unremarkable.  She also had the MRI of the brain done, but it was not read before she left,  because she left AMA.  I got a message from the emergency room doctor that the workup looked okay at that time, but she needed follow-up.  I subsequently opened her chart and looked at the MRI myself and noted that the patient had a 1.2 cm acute ischemic infarct over the left frontal centrum semiovale.  I personally reviewed the images.  She last saw cardiology March 17.  Notes are reviewed.  Current movement disorder medications: Primidone, 50 mg, 2 po bid (increased)  Prior medications: Propranolol (bradycardia)   ALLERGIES:  No Known Allergies  CURRENT MEDICATIONS:  Outpatient Encounter Medications as of 08/12/2023  Medication Sig   Alcohol Swabs (DROPSAFE ALCOHOL PREP) 70 % PADS USE AS DIRECTED  AS NEEDED.   aspirin EC 81 MG tablet Take 1 tablet (81 mg total) by mouth daily.   atorvastatin (LIPITOR) 40 MG tablet Take 1 tablet (40 mg total) by mouth daily.   B-D ULTRAFINE III SHORT PEN 31G X 8 MM MISC USE 4 TIMES A DAY   Blood Glucose Monitoring Suppl (TRUE METRIX METER) w/Device KIT Use as directed 3-4 times daily.  Dx- type 2 diabetes w/ use of insulin   calcium-vitamin D (OSCAL WITH D) 500-5 MG-MCG tablet Take 1 tablet by mouth.   clonazePAM (KLONOPIN) 1 MG tablet TAKE 1  TABLET AT BEDTIME FOR RESTLESS LEGS SYNDROME   diclofenac Sodium (VOLTAREN) 1 % GEL APPLY 2 GRAMS TO AFFECTED AREA 4 TIMES A DAY (Patient taking differently: Apply 2 g topically 4 (four) times daily as needed (for pain- hands and knees).)   fenofibrate 160 MG tablet TAKE 1 TABLET DAILY   furosemide (LASIX) 20 MG tablet TAKE 1 TABLET EVERY DAY   gabapentin (NEURONTIN) 300 MG capsule Take 900 mg by mouth 2 (two) times daily.   glucose blood test strip 1 each by Other route as needed for other. Test 4 times daily   glucose blood test strip 1 each by Other route 4 (four) times daily - after meals and at bedtime. Use as instructed   insulin aspart (NOVOLOG FLEXPEN) 100 UNIT/ML FlexPen INJECT 25 UNITS SUBCUTANEOUSLY THREE  TIMES DAILY WITH MEALS   insulin glargine (LANTUS SOLOSTAR) 100 UNIT/ML Solostar Pen INJECT 60 UNITS UNDER THE SKIN AT BEDTIME   Lancets Super Thin 28G MISC Please use as directed to test sugars 4 times daily. Dx. E11.9   lansoprazole (PREVACID) 15 MG capsule Take 15 mg by mouth daily before breakfast.   levothyroxine (SYNTHROID) 50 MCG tablet TAKE 1 TABLET BY MOUTH EVERY DAY BEFORE BREAKFAST   loperamide (IMODIUM A-D) 2 MG tablet Take 2 mg by mouth daily as needed for diarrhea or loose stools.   OneTouch UltraSoft 2 Lancets MISC 1 Device by Does not apply route 4 (four) times daily.   primidone (MYSOLINE) 50 MG tablet TAKE 2 TABLETS BY MOUTH 2 TIMES DAILY.   promethazine-dextromethorphan (PROMETHAZINE-DM) 6.25-15 MG/5ML syrup Take 5 mLs by mouth 4 (four) times daily as needed.   sertraline (ZOLOFT) 100 MG tablet Take 1 tablet (100 mg total) by mouth daily.   traZODone (DESYREL) 100 MG tablet TAKE 1 TABLET AT BEDTIME   trimethoprim (TRIMPEX) 100 MG tablet Take by mouth.   valsartan (DIOVAN) 320 MG tablet TAKE 1 TABLET DAILY   verapamil (CALAN-SR) 120 MG CR tablet TAKE 1 TABLET BY MOUTH EVERYDAY AT BEDTIME   No facility-administered encounter medications on file as of 08/12/2023.    Objective:   PHYSICAL EXAMINATION:    VITALS:   There were no vitals filed for this visit.    GEN:  The patient appears stated age and is in NAD. HEENT:  Normocephalic, atraumatic.  The mucous membranes are moist. The superficial temporal arteries are without ropiness or tenderness. CV:  RRR Lungs:  there are diffuse exp wheezes Neck/HEME:  There are no carotid bruits bilaterally.   Neurological examination:   Orientation: The patient is alert and oriented x3.  Cranial nerves: There is good facial symmetry.  Extraocular muscles are intact. The visual fields are full to confrontational testing. The speech is fluent and clear. Soft palate rises symmetrically and there is no tongue deviation. Hearing is  intact to conversational tone. Sensation: Sensation is intact to light touch throughout  Motor: Strength is at least antigravity x 4.  Has trouble raising/abducting the R arm due to pain in the shoulder   Movement examination: Tone: There is normal tone in the bilateral upper extremities.  The tone in the lower extremities is normal.  Abnormal movements: no rest tremor today.  No trouble with archimedes spirals.  No postural tremor.  Min intention tremor on the R Coordination:  There is no decremation with RAM's, with any form of RAMS, including alternating supination and pronation of the forearm, hand opening and closing, finger taps, heel taps and toe taps.  I have reviewed and interpreted the following labs independently    Chemistry      Component Value Date/Time   NA 139 08/09/2023 1522   NA 142 11/27/2021 1107   K 3.8 08/09/2023 1522   CL 105 08/09/2023 1522   CO2 24 08/09/2023 1522   BUN 19 08/09/2023 1522   BUN 15 11/27/2021 1107   CREATININE 0.81 08/09/2023 1522   CREATININE 0.89 11/18/2012 1433      Component Value Date/Time   CALCIUM 9.2 08/09/2023 1522   ALKPHOS 48 08/09/2023 1522   AST 16 08/09/2023 1522   ALT 9 08/09/2023 1522   BILITOT 0.7 08/09/2023 1522       Lab Results  Component Value Date   WBC 8.2 08/09/2023   HGB 13.0 08/09/2023   HCT 39.8 08/09/2023   MCV 97.3 08/09/2023   PLT 190 08/09/2023    Lab Results  Component Value Date   TSH 2.45 08/02/2023   Lab Results  Component Value Date   CHOL 159 08/02/2023   HDL 63.00 08/02/2023   LDLCALC 79 08/02/2023   LDLDIRECT 86.0 05/02/2015   TRIG 84.0 08/02/2023   CHOLHDL 3 08/02/2023   Lab Results  Component Value Date   HGBA1C 7.3 (H) 08/02/2023     Cc:  Sheliah Hatch, MD

## 2023-08-12 ENCOUNTER — Other Ambulatory Visit (INDEPENDENT_AMBULATORY_CARE_PROVIDER_SITE_OTHER)

## 2023-08-12 ENCOUNTER — Ambulatory Visit: Admitting: Neurology

## 2023-08-12 ENCOUNTER — Other Ambulatory Visit: Payer: Self-pay | Admitting: Neurology

## 2023-08-12 VITALS — BP 132/74 | HR 51 | Wt 207.6 lb

## 2023-08-12 DIAGNOSIS — I63312 Cerebral infarction due to thrombosis of left middle cerebral artery: Secondary | ICD-10-CM

## 2023-08-12 MED ORDER — CLOPIDOGREL BISULFATE 75 MG PO TABS: 75.0000 mg | ORAL_TABLET | Freq: Every day | ORAL | 0 refills | Status: DC

## 2023-08-12 NOTE — Patient Instructions (Signed)
 Stop Plavix after 90 days

## 2023-08-12 NOTE — Progress Notes (Unsigned)
 Enrolled for [employer] to mail a ZIO AT Live Telemetry monitor to patients address on file.   Dr. Flora Lipps to read.

## 2023-08-17 DIAGNOSIS — I63312 Cerebral infarction due to thrombosis of left middle cerebral artery: Secondary | ICD-10-CM | POA: Diagnosis not present

## 2023-08-18 ENCOUNTER — Ambulatory Visit (HOSPITAL_BASED_OUTPATIENT_CLINIC_OR_DEPARTMENT_OTHER): Payer: Medicare HMO | Admitting: Orthopaedic Surgery

## 2023-08-24 ENCOUNTER — Ambulatory Visit: Attending: Neurology | Admitting: Occupational Therapy

## 2023-08-24 ENCOUNTER — Other Ambulatory Visit: Payer: Self-pay

## 2023-08-24 ENCOUNTER — Encounter: Payer: Self-pay | Admitting: Occupational Therapy

## 2023-08-24 DIAGNOSIS — M6281 Muscle weakness (generalized): Secondary | ICD-10-CM | POA: Insufficient documentation

## 2023-08-24 DIAGNOSIS — R278 Other lack of coordination: Secondary | ICD-10-CM | POA: Diagnosis not present

## 2023-08-24 DIAGNOSIS — M25611 Stiffness of right shoulder, not elsewhere classified: Secondary | ICD-10-CM | POA: Diagnosis not present

## 2023-08-24 DIAGNOSIS — I63312 Cerebral infarction due to thrombosis of left middle cerebral artery: Secondary | ICD-10-CM | POA: Diagnosis not present

## 2023-08-24 DIAGNOSIS — R208 Other disturbances of skin sensation: Secondary | ICD-10-CM | POA: Diagnosis not present

## 2023-08-24 DIAGNOSIS — R29818 Other symptoms and signs involving the nervous system: Secondary | ICD-10-CM | POA: Diagnosis not present
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
## 2023-08-25 ENCOUNTER — Ambulatory Visit (HOSPITAL_BASED_OUTPATIENT_CLINIC_OR_DEPARTMENT_OTHER): Payer: Medicare HMO | Admitting: Orthopaedic Surgery

## 2023-08-25 DIAGNOSIS — Z96611 Presence of right artificial shoulder joint: Secondary | ICD-10-CM

## 2023-08-25 NOTE — Progress Notes (Signed)
 Post Operative Evaluation    Procedure/Date of Surgery: Right shoulder reverse shoulder arthroplasty July 12, 2022  Interval History:    Presents today for follow-up of the above procedure.  Unfortunately she did recently have a stroke and as result has had some right shoulder weakness.  This has also occurred in the setting of the passing of her daughter which she has been struggling with cancer.  She is planning to begin physical therapy soon for strengthening of the right shoulder   PMH/PSH/Family History/Social History/Meds/Allergies:    Past Medical History:  Diagnosis Date   Adenomatous colon polyp    hyperplastic   Anemia    Anginal pain (HCC)    not recent   Arthritis    NECK, KNEES, FINGERS, TOES   Atrial tachycardia (HCC) CARDIOLOGIST - DR Rodolfo Clan (LAST VISIT AUG 2012)   Echo 12/11: EF 55-60%, Mild LVH, grade 1 diast dysfxn;   holter 1/12: ATach   Atypical chest pain    a. 07/2004 Cath: Clean cors;  b. 04/2010 Myoview : EF 63%, no ischemia   Blood transfusion without reported diagnosis 1969   CHF (congestive heart failure) (HCC)    Chronic kidney disease    left kidney small    Coronary artery disease    Diabetes mellitus, type 2 (HCC)    ORAL AND INSULIN  MEDS (LAST A1C  7.3)   Diverticulosis    Erythema CURRENT-- CLOSED ABD. WALL ABSCESS   PER PCP NOTE (03-31-11)- MRSA-- TAKES DOXYCYCLINE    GERD (gastroesophageal reflux disease)    CONTROLLED W/ PREVACID   History of kidney stones 2012   Hyperlipidemia    Hypertension    Insomnia    TAKES MEDS PRN   Neuropathy, peripheral    Obesity (BMI 30-39.9) 02/19/2015   Pneumonia    as child   Post op infection 11/07/2012   left bunionectomy   Restless leg syndrome    Sepsis, urinary HISTORY - 2004   Sleep apnea    no cpap    Thyroid  disease    TIA (transient ischemic attack)    "mini strokes" 2023   Tremor    Vitamin D  deficiency    Past Surgical History:  Procedure  Laterality Date   ABDOMINAL HYSTERECTOMY  1995   ovaries remain   BUNIONECTOMY Left 10/24/2012   BUNIONECTOMY Right 05/17/2017   CARDIAC CATHETERIZATION  07/10/2004   CARPAL TUNNEL RELEASE  2000   RIGHT   CATARACT EXTRACTION, BILATERAL     CERVICAL FUSION  10/12/2007   C5 - 7   COLONOSCOPY     CYSTOSCOPY WITH RETROGRADE PYELOGRAM, URETEROSCOPY AND STENT PLACEMENT Left 11/28/2012   Procedure: LEFT RETROGRADE PYELOGRAM, LEFT URETEROSCOPY, ;  Surgeon: Mark C Ottelin, MD;  Location: [facility];  Service: Urology;  Laterality: Left;   CYSTOSCOPY/RETROGRADE/URETEROSCOPY/STONE EXTRACTION WITH BASKET  X2 2004 & 2009   LEFT    GASTRIC BYPASS  1981   KNEE ARTHROSCOPY  12/2010   LEFT   LAPAROSCOPIC CHOLECYSTECTOMY  2001   LEFT HEART CATH AND CORONARY ANGIOGRAPHY N/A 12/03/2021   Procedure: LEFT HEART CATH AND CORONARY ANGIOGRAPHY;  Surgeon: Lucendia Rusk, MD;  Location: [facility] INVASIVE CV [facility];  Service: Cardiovascular;  Laterality: N/A;   left thumb joint surgery  2013   PERCUTANEOUS NEPHROSTOLITHOTOMY  02/27/2011   LEFT   POLYPECTOMY  REVERSE SHOULDER ARTHROPLASTY Right 07/12/2022   Procedure: REVERSE SHOULDER ARTHROPLASTY;  Surgeon: Wilhelmenia Harada, MD;  Location: [facility];  Service: Orthopedics;  Laterality: Right;   RIGHT THUMB JOINT SURG.  09/2009   TRIGGER FINGER RELEASE  2010   RIGHT THUMB   UPPER GASTROINTESTINAL ENDOSCOPY     URETERAL STENT PLACEMENT  X2  2004  &  2009   LEFT   URETEROSCOPY  04/06/2011   Procedure: URETEROSCOPY;  Surgeon: Alanson Alliance, MD;  Location: [facility];  Service: Urology;  Laterality: Left;  L Ureteroscopy Laser Litho & Stent    Social History   Socioeconomic History   Marital status: Married    Spouse name: Not on file   Number of children: 2   Years of education: 12   Highest education level: 12th grade  Occupational History   Occupation: Educational psychologist middle school    Employer: [employer]    Comment: in office   Occupation:  Retired  Tobacco Use   Smoking status: Every Day    Current packs/day: 2.00    Average packs/day: 2.0 packs/day for 43.0 years (86.0 ttl pk-yrs)    Types: Cigarettes   Smokeless tobacco: Never   Tobacco comments:    1/2 ppd 12/29/21  Vaping Use   Vaping status: Never Used  Substance and Sexual Activity   Alcohol use: No    Alcohol/week: 0.0 standard drinks of alcohol   Drug use: No   Sexual activity: Not Currently  Other Topics Concern   Not on file  Social History Narrative   2 children, 2 stepchildren   Lives with husband.   Right-handed.   No daily caffeine.   Social Drivers of Corporate investment banker Strain: Low Risk  (04/29/2023)   Overall Financial Resource Strain (CARDIA)    Difficulty of Paying Living Expenses: Not hard at all  Food Insecurity: No Food Insecurity (04/29/2023)   Hunger Vital Sign    Worried About Running Out of Food in the Last Year: Never true    Ran Out of Food in the Last Year: Never true  Transportation Needs: No Transportation Needs (04/29/2023)   PRAPARE - Administrator, Civil Service (Medical): No    Lack of Transportation (Non-Medical): No  Physical Activity: Insufficiently Active (04/29/2023)   Exercise Vital Sign    Days of Exercise per Week: 1 day    Minutes of Exercise per Session: 20 min  Stress: Stress Concern Present (04/29/2023)   [employer] of Occupational Health - Occupational Stress Questionnaire    Feeling of Stress : To some extent  Social Connections: Moderately Integrated (04/29/2023)   Social Connection and Isolation Panel [NHANES]    Frequency of Communication with Friends and Family: More than three times a week    Frequency of Social Gatherings with Friends and Family: More than three times a week    Attends Religious Services: More than 4 times per year    Active Member of [employer] or Organizations: No    Attends Banker Meetings: Never    Marital Status: Married   Family History   Problem Relation Age of Onset   Hyperlipidemia Mother    Hypertension Mother    Heart attack Father    Lung disease Father    Heart disease Sister    Hypertension Sister    Colon polyps Sister    Hypertension Sister    Colon polyps Sister    Hypertension Sister  Hypertension Sister    Lung cancer Sister 19       stage 4    Cancer - Other Daughter    Diabetes Other    Breast cancer Other    Heart disease Other    Colon cancer Other 48       nephew   Esophageal cancer Neg Hx    Rectal cancer Neg Hx    Stomach cancer Neg Hx    Amblyopia Neg Hx    Blindness Neg Hx    Cataracts Neg Hx    Glaucoma Neg Hx    Retinal detachment Neg Hx    Strabismus Neg Hx    Retinitis pigmentosa Neg Hx    No Known Allergies Current Outpatient Medications  Medication Sig Dispense Refill   Alcohol Swabs (DROPSAFE ALCOHOL PREP) 70 % PADS USE AS DIRECTED  AS NEEDED. 200 each 3   aspirin  EC 81 MG tablet Take 1 tablet (81 mg total) by mouth daily. 30 tablet 11   atorvastatin  (LIPITOR) 40 MG tablet Take 1 tablet (40 mg total) by mouth daily. 90 tablet 3   B-D ULTRAFINE III SHORT PEN 31G X 8 MM MISC USE 4 TIMES A DAY 400 each 6   Blood Glucose Monitoring Suppl (TRUE METRIX METER) w/Device KIT Use as directed 3-4 times daily.  Dx- type 2 diabetes w/ use of insulin  1 kit 1   calcium -vitamin D  (OSCAL WITH D) 500-5 MG-MCG tablet Take 1 tablet by mouth.     clonazePAM  (KLONOPIN ) 1 MG tablet TAKE 1 TABLET AT BEDTIME FOR RESTLESS LEGS SYNDROME 30 tablet 3   clopidogrel  (PLAVIX ) 75 MG tablet Take 1 tablet (75 mg total) by mouth daily. 90 tablet 0   diclofenac  Sodium (VOLTAREN ) 1 % GEL APPLY 2 GRAMS TO AFFECTED AREA 4 TIMES A DAY (Patient taking differently: Apply 2 g topically 4 (four) times daily as needed (for pain- hands and knees).) 300 g 1   fenofibrate  160 MG tablet TAKE 1 TABLET DAILY 90 tablet 3   furosemide  (LASIX ) 20 MG tablet TAKE 1 TABLET EVERY DAY 90 tablet 1   gabapentin  (NEURONTIN ) 300 MG  capsule Take 900 mg by mouth 2 (two) times daily.     glucose blood test strip 1 each by Other route as needed for other. Test 4 times daily     glucose blood test strip 1 each by Other route 4 (four) times daily - after meals and at bedtime. Use as instructed 400 each 3   insulin  aspart (NOVOLOG  FLEXPEN) 100 UNIT/ML FlexPen INJECT 25 UNITS SUBCUTANEOUSLY THREE TIMES DAILY WITH MEALS 75 mL 4   insulin  glargine (LANTUS  SOLOSTAR) 100 UNIT/ML Solostar Pen INJECT 60 UNITS UNDER THE SKIN AT BEDTIME 15 mL 3   Lancets Super Thin 28G MISC Please use as directed to test sugars 4 times daily. Dx. E11.9 420 each 3   lansoprazole (PREVACID) 15 MG capsule Take 15 mg by mouth daily before breakfast.     levothyroxine  (SYNTHROID ) 50 MCG tablet TAKE 1 TABLET BY MOUTH EVERY DAY BEFORE BREAKFAST 90 tablet 1   loperamide (IMODIUM A-D) 2 MG tablet Take 2 mg by mouth daily as needed for diarrhea or loose stools.     OneTouch UltraSoft 2 Lancets MISC 1 Device by Does not apply route 4 (four) times daily. 100 each 3   primidone  (MYSOLINE ) 50 MG tablet TAKE 2 TABLETS BY MOUTH 2 TIMES DAILY. 360 tablet 0   promethazine -dextromethorphan (PROMETHAZINE -DM) 6.25-15 MG/5ML syrup Take 5 mLs  by mouth 4 (four) times daily as needed. 180 mL 0   sertraline  (ZOLOFT ) 100 MG tablet Take 1 tablet (100 mg total) by mouth daily. 90 tablet 3   traZODone  (DESYREL ) 100 MG tablet TAKE 1 TABLET AT BEDTIME 90 tablet 3   trimethoprim (TRIMPEX) 100 MG tablet Take by mouth.     valsartan  (DIOVAN ) 320 MG tablet TAKE 1 TABLET DAILY 90 tablet 2   verapamil  (CALAN -SR) 120 MG CR tablet TAKE 1 TABLET BY MOUTH EVERYDAY AT BEDTIME 90 tablet 1   No current facility-administered medications for this visit.   No results found.  Review of Systems:   A ROS was performed including pertinent positives and negatives as documented in the HPI.   Musculoskeletal Exam:     Right shoulder incision is healed.  Stable to flex and extend at the right elbow.   Active forward elevation is to 80 degrees with external rotation at side of 20 degrees.  Internal rotation is to side.  Sensation is intact in axillary distribution.  Fires EPL as well as wrist extensors.  2+ radial pulse  Imaging:    AP right shoulder: Status post reverse shoulder arthroplasty without evidence of complication  I personally reviewed and interpreted the radiographs.   Assessment:   73 year old female is status post right shoulder reverse shoulder arthroplasty for fracture.  Overall she does have worsened range of motion and weakness today in the setting of a stroke.  I did discuss that I do believe that initial period of recovery and rehab is in her best interest.  I would like to obtain an x-ray at the next visit so that we can assess the progress of her shoulder.  She is still having pain and as result we will ultimately need to plan to work her up for possible low-grade infection in the future although I do believe her stroke recovery and rehab should be prioritized over this Plan :    -Return to clinic 3 months      I personally saw and evaluated the patient, and participated in the management and treatment plan.  Wilhelmenia Harada, MD Attending Physician, Orthopedic Surgery  This document was dictated using Dragon voice recognition software. A reasonable attempt at proof reading has been made to minimize errors.

## 2023-08-31 ENCOUNTER — Ambulatory Visit: Admitting: Occupational Therapy

## 2023-08-31 DIAGNOSIS — I63312 Cerebral infarction due to thrombosis of left middle cerebral artery: Secondary | ICD-10-CM | POA: Diagnosis not present

## 2023-08-31 DIAGNOSIS — M6281 Muscle weakness (generalized): Secondary | ICD-10-CM

## 2023-08-31 DIAGNOSIS — R29818 Other symptoms and signs involving the nervous system: Secondary | ICD-10-CM | POA: Diagnosis not present

## 2023-08-31 DIAGNOSIS — R208 Other disturbances of skin sensation: Secondary | ICD-10-CM

## 2023-08-31 DIAGNOSIS — R278 Other lack of coordination: Secondary | ICD-10-CM | POA: Diagnosis not present

## 2023-08-31 DIAGNOSIS — M25611 Stiffness of right shoulder, not elsewhere classified: Secondary | ICD-10-CM | POA: Diagnosis not present
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
## 2023-08-31 NOTE — Patient Instructions (Signed)
 Access Code: [email or web address] URL: [email or web address] Date: 08/31/2023 Prepared by: Sudie Ely  Exercises - Putty Squeezes  - 1-2 x daily - 10 reps - Rolling Putty on Table  - 1-2 x daily - 10 reps - Finger Pinch and Pull with Putty  - 1-2 x daily - 10 reps - 3-Point Pinch with Putty  - 1-2 x daily - 10 reps - Tip PUSH with Putty  - 1-2 x daily - 10 reps - Key Pinch with Putty  - 1-2 x daily - 10 reps - Finger Extension with Putty  - 1-2 x daily - 10 reps - Finger Adduction with Putty  - 1-2 x daily - 10 reps - Removing Marbles from Putty  - 1-2 x daily - 10 reps

## 2023-09-01 ENCOUNTER — Ambulatory Visit: Payer: Self-pay

## 2023-09-01 NOTE — Telephone Encounter (Signed)
 Copied from CRM [id number]. Topic: Clinical - Red Word Triage >> Sep 01, 2023 10:59 AM Alethia Huxley E wrote: [facility] that prompted transfer to Nurse Triage: Leg swelling. Patient noticed her leg started to swell this past Sunday 4/27. Swelling is from her toes to her knee on her right leg. Patient stated it is painful and hard to walk on.   Chief Complaint: Leg Swelling  Symptoms: Pain, Limping  Frequency: Since Sunday  Pertinent Negatives: Patient denies redness, chest pain, or dyspnea.   Disposition: [] ED /[] Urgent Care (no appt availability in office) / [x] Appointment(In office/virtual)/ []  [facility]/ [] Home Care/ [] Refused Recommended Disposition /[] [facility]/ []  Follow-up with PCP Additional Notes: [facility] is being triaged for right leg swelling. The patient states she has pain with weight bearing. The swelling is from the knee to the foot on the right leg. In office appointment made for tomorrow morning.   Reason for Disposition  [1] MODERATE leg swelling (e.g., swelling extends up to knees) AND [2] new-onset or worsening  Answer Assessment - Initial Assessment Questions 1. ONSET: "When did the swelling start?" (e.g., minutes, hours, days)     Since This Past Sunday  2. LOCATION: "What part of the leg is swollen?"  "Are both legs swollen or just one leg?"     Right leg, knee to foot  3. SEVERITY: "How bad is the swelling?" (e.g., localized; mild, moderate, severe)   - Localized: Small area of swelling localized to one leg.   - MILD pedal edema: Swelling limited to foot and ankle, pitting edema < 1/4 inch (6 mm) deep, rest and elevation eliminate most or all swelling.   - MODERATE edema: Swelling of lower leg to knee, pitting edema > 1/4 inch (6 mm) deep, rest and elevation only partially reduce swelling.   - SEVERE edema: Swelling extends above knee, facial or hand swelling present.      Moderate  4. REDNESS: "Does the swelling look red or infected?"      No  5. PAIN: "Is the swelling painful to touch?" If Yes, ask: "How painful is it?"   (Scale 1-10; mild, moderate or severe)     Pain with weight bearing  6. FEVER: "Do you have a fever?" If Yes, ask: "What is it, how was it measured, and when did it start?"      No  7. CAUSE: "What do you think is causing the leg swelling?"     Unsure  8. MEDICAL HISTORY: "Do you have a history of blood clots (e.g., DVT), cancer, heart failure, kidney disease, or liver failure?"     Diabetes, Cardiac History,   9. RECURRENT SYMPTOM: "Have you had leg swelling before?" If Yes, ask: "When was the last time?" "What happened that time?"     No  10. OTHER SYMPTOMS: "Do you have any other symptoms?" (e.g., chest pain, difficulty breathing)       No  11. PREGNANCY: "Is there any chance you are pregnant?" "When was your last menstrual period?"       No and No  Protocols used: Leg Swelling and Edema-A-AH

## 2023-09-01 NOTE — Telephone Encounter (Signed)
 Patient was made an appointment for tomorrow.

## 2023-09-02 ENCOUNTER — Ambulatory Visit (INDEPENDENT_AMBULATORY_CARE_PROVIDER_SITE_OTHER): Admitting: Family Medicine

## 2023-09-02 ENCOUNTER — Ambulatory Visit (HOSPITAL_COMMUNITY)
Admission: RE | Admit: 2023-09-02 | Discharge: 2023-09-02 | Disposition: A | Discharge status: Home / Self Care | Source: Ambulatory Visit | Attending: Vascular Surgery | Admitting: Vascular Surgery

## 2023-09-02 ENCOUNTER — Encounter: Payer: Self-pay | Admitting: Family Medicine

## 2023-09-02 VITALS — BP 162/48 | HR 54 | Temp 97.9°F | Resp 20 | Wt 210.0 lb

## 2023-09-02 DIAGNOSIS — M7989 Other specified soft tissue disorders: Secondary | ICD-10-CM | POA: Diagnosis not present

## 2023-09-02 NOTE — Progress Notes (Signed)
   Subjective:    Patient ID: Lacey [name], female    DOB: Dec 13, 1950, 73 y.o.   MRN: [id number]  HPI Leg swelling- R leg.  Pt reports she has hx of intermittent swelling that typically resolves w/ elevation.  Starting on Tuesday, swelling has been constant and painful.  No change in activity level or injury.  No improvement w/ daily lasix .  No relief w/ elevation.  Swelling will worsen w/ prolonged standing.  Currently on ASA and Plavix .  No hx of DVT   Review of Systems For ROS see HPI     Objective:   Physical Exam Vitals reviewed.  Constitutional:      General: She is not in acute distress.    Appearance: She is not ill-appearing.  HENT:     Head: Normocephalic and atraumatic.  Cardiovascular:     Rate and Rhythm: Normal rate and regular rhythm.  Pulmonary:     Effort: Pulmonary effort is normal. No respiratory distress.     Breath sounds: No wheezing or rhonchi.  Musculoskeletal:     Right lower leg: Edema (1+ edema of R lower leg, diffusely TTP) present.     Left lower leg: No edema.  Skin:    General: Skin is warm and dry.     Findings: No erythema.  Neurological:     Mental Status: She is alert.           Assessment & Plan:  R leg swelling- new.  Pt reports she has had episodic bilateral leg swelling but since Tuesday she has had constant R leg swelling w/ pain.  No improvement w/ elevation or lasix .  Concern for DVT despite being on ASA and Plavix .  Attempting a stat doppler- currently on hold w/ insurance for prior authorization.  Will determine next steps once doppler results available.  Pt expressed understanding and is in agreement w/ plan.

## 2023-09-02 NOTE — Patient Instructions (Addendum)
 You have a vascular ultrasound scheduled for 1:00 at [address] (the new cardiology building) Once we know the results, we can determine the next steps Try and limit your salt intake Increase your water  intake Elevate your leg as much as possible Call with any questions or concerns Hang in there!!

## 2023-09-03 ENCOUNTER — Telehealth (HOSPITAL_BASED_OUTPATIENT_CLINIC_OR_DEPARTMENT_OTHER): Payer: Self-pay | Admitting: Orthopaedic Surgery

## 2023-09-03 ENCOUNTER — Encounter: Payer: Self-pay | Admitting: Family Medicine

## 2023-09-03 NOTE — Telephone Encounter (Signed)
 Patient states that her primary car is concerned about the fluid in her knee and told her to call DR B. Please advise [phone]

## 2023-09-04 ENCOUNTER — Other Ambulatory Visit: Payer: Self-pay | Admitting: Family Medicine

## 2023-09-04 ENCOUNTER — Other Ambulatory Visit: Payer: Self-pay | Admitting: Neurology

## 2023-09-04 DIAGNOSIS — R251 Tremor, unspecified: Secondary | ICD-10-CM

## 2023-09-08 ENCOUNTER — Ambulatory Visit (INDEPENDENT_AMBULATORY_CARE_PROVIDER_SITE_OTHER): Admitting: Orthopaedic Surgery

## 2023-09-08 ENCOUNTER — Other Ambulatory Visit: Payer: Self-pay

## 2023-09-08 ENCOUNTER — Other Ambulatory Visit (HOSPITAL_BASED_OUTPATIENT_CLINIC_OR_DEPARTMENT_OTHER): Payer: Self-pay

## 2023-09-08 ENCOUNTER — Ambulatory Visit (HOSPITAL_BASED_OUTPATIENT_CLINIC_OR_DEPARTMENT_OTHER)

## 2023-09-08 ENCOUNTER — Ambulatory Visit: Attending: Neurology | Admitting: Occupational Therapy

## 2023-09-08 DIAGNOSIS — M1711 Unilateral primary osteoarthritis, right knee: Secondary | ICD-10-CM | POA: Diagnosis not present

## 2023-09-08 DIAGNOSIS — M25611 Stiffness of right shoulder, not elsewhere classified: Secondary | ICD-10-CM | POA: Insufficient documentation

## 2023-09-08 DIAGNOSIS — G8929 Other chronic pain: Secondary | ICD-10-CM

## 2023-09-08 DIAGNOSIS — R278 Other lack of coordination: Secondary | ICD-10-CM | POA: Diagnosis not present

## 2023-09-08 DIAGNOSIS — R208 Other disturbances of skin sensation: Secondary | ICD-10-CM | POA: Insufficient documentation

## 2023-09-08 DIAGNOSIS — M6281 Muscle weakness (generalized): Secondary | ICD-10-CM | POA: Insufficient documentation

## 2023-09-08 DIAGNOSIS — M25561 Pain in right knee: Secondary | ICD-10-CM

## 2023-09-08 DIAGNOSIS — R29818 Other symptoms and signs involving the nervous system: Secondary | ICD-10-CM | POA: Insufficient documentation

## 2023-09-08 MED ORDER — LIDOCAINE HCL 1 % IJ SOLN
4.0000 mL | INTRAMUSCULAR | Status: AC | PRN
  Administered 2023-09-08: 4 mL

## 2023-09-08 MED ORDER — TRIAMCINOLONE ACETONIDE 40 MG/ML IJ SUSP
80.0000 mg | INTRAMUSCULAR | Status: AC | PRN
  Administered 2023-09-08: 80 mg via INTRA_ARTICULAR
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
## 2023-09-08 NOTE — Addendum Note (Signed)
 Addended by: Albesa Huguenin on: 09/08/2023 05:08 PM   Modules accepted: Orders
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
## 2023-09-12 DIAGNOSIS — I63312 Cerebral infarction due to thrombosis of left middle cerebral artery: Secondary | ICD-10-CM

## 2023-09-13 ENCOUNTER — Encounter: Payer: Self-pay | Admitting: Neurology

## 2023-09-13 ENCOUNTER — Ambulatory Visit
Admission: RE | Admit: 2023-09-13 | Discharge: 2023-09-13 | Disposition: A | Discharge status: Home / Self Care | Source: Ambulatory Visit | Attending: Neurology | Admitting: Neurology

## 2023-09-13 DIAGNOSIS — I340 Nonrheumatic mitral (valve) insufficiency: Secondary | ICD-10-CM | POA: Diagnosis not present

## 2023-09-13 DIAGNOSIS — I6389 Other cerebral infarction: Secondary | ICD-10-CM | POA: Diagnosis not present

## 2023-09-13 DIAGNOSIS — I517 Cardiomegaly: Secondary | ICD-10-CM | POA: Diagnosis not present

## 2023-09-13 DIAGNOSIS — Z8673 Personal history of transient ischemic attack (TIA), and cerebral infarction without residual deficits: Secondary | ICD-10-CM | POA: Diagnosis not present

## 2023-09-13 DIAGNOSIS — I63312 Cerebral infarction due to thrombosis of left middle cerebral artery: Secondary | ICD-10-CM | POA: Diagnosis not present

## 2023-09-13 LAB — ECHOCARDIOGRAM COMPLETE BUBBLE STUDY
AR max vel: 2.22 cm2
AV Area VTI: 2.28 cm2
AV Area mean vel: 2.27 cm2
AV Mean grad: 5 mmHg
AV Peak grad: 9.4 mmHg
Ao pk vel: 1.53 m/s
Area-P 1/2: 2.84 cm2
MV VTI: 2.22 cm2
S' Lateral: 3.8 cm

## 2023-09-13 NOTE — Progress Notes (Signed)
*  PRELIMINARY RESULTS* Echocardiogram 2D Echocardiogram has been performed.  Lacey [name] 09/13/2023, 11:28 AM

## 2023-09-14 ENCOUNTER — Ambulatory Visit: Payer: Self-pay | Admitting: Occupational Therapy

## 2023-09-14 DIAGNOSIS — R29818 Other symptoms and signs involving the nervous system: Secondary | ICD-10-CM | POA: Diagnosis not present

## 2023-09-14 DIAGNOSIS — M6281 Muscle weakness (generalized): Secondary | ICD-10-CM | POA: Diagnosis not present

## 2023-09-14 DIAGNOSIS — M25611 Stiffness of right shoulder, not elsewhere classified: Secondary | ICD-10-CM

## 2023-09-14 DIAGNOSIS — R278 Other lack of coordination: Secondary | ICD-10-CM | POA: Diagnosis not present

## 2023-09-14 DIAGNOSIS — R208 Other disturbances of skin sensation: Secondary | ICD-10-CM
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
## 2023-09-19 ENCOUNTER — Other Ambulatory Visit: Payer: Self-pay | Admitting: Family Medicine

## 2023-09-23 ENCOUNTER — Ambulatory Visit: Payer: Self-pay | Admitting: Occupational Therapy

## 2023-09-23 DIAGNOSIS — M25611 Stiffness of right shoulder, not elsewhere classified: Secondary | ICD-10-CM | POA: Diagnosis not present

## 2023-09-23 DIAGNOSIS — R278 Other lack of coordination: Secondary | ICD-10-CM | POA: Diagnosis not present

## 2023-09-23 DIAGNOSIS — R29818 Other symptoms and signs involving the nervous system: Secondary | ICD-10-CM | POA: Diagnosis not present

## 2023-09-23 DIAGNOSIS — R208 Other disturbances of skin sensation: Secondary | ICD-10-CM | POA: Diagnosis not present

## 2023-09-23 DIAGNOSIS — M6281 Muscle weakness (generalized): Secondary | ICD-10-CM
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
## 2023-09-24 ENCOUNTER — Ambulatory Visit (HOSPITAL_BASED_OUTPATIENT_CLINIC_OR_DEPARTMENT_OTHER): Admitting: Orthopaedic Surgery

## 2023-09-30 ENCOUNTER — Ambulatory Visit: Admitting: Occupational Therapy

## 2023-09-30 DIAGNOSIS — R29818 Other symptoms and signs involving the nervous system: Secondary | ICD-10-CM

## 2023-09-30 DIAGNOSIS — M25611 Stiffness of right shoulder, not elsewhere classified: Secondary | ICD-10-CM | POA: Diagnosis not present

## 2023-09-30 DIAGNOSIS — M6281 Muscle weakness (generalized): Secondary | ICD-10-CM | POA: Diagnosis not present

## 2023-09-30 DIAGNOSIS — R278 Other lack of coordination: Secondary | ICD-10-CM | POA: Diagnosis not present

## 2023-09-30 DIAGNOSIS — R208 Other disturbances of skin sensation: Secondary | ICD-10-CM
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
## 2023-10-08 ENCOUNTER — Ambulatory Visit: Attending: Neurology | Admitting: Occupational Therapy

## 2023-10-08 DIAGNOSIS — R208 Other disturbances of skin sensation: Secondary | ICD-10-CM | POA: Diagnosis not present

## 2023-10-08 DIAGNOSIS — M6281 Muscle weakness (generalized): Secondary | ICD-10-CM | POA: Insufficient documentation

## 2023-10-08 DIAGNOSIS — R278 Other lack of coordination: Secondary | ICD-10-CM | POA: Diagnosis not present

## 2023-10-08 DIAGNOSIS — R29818 Other symptoms and signs involving the nervous system: Secondary | ICD-10-CM | POA: Diagnosis not present
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
## 2023-10-11 ENCOUNTER — Other Ambulatory Visit: Payer: Self-pay | Admitting: Family Medicine

## 2023-10-18 ENCOUNTER — Ambulatory Visit: Admitting: Orthopaedic Surgery

## 2023-10-18 ENCOUNTER — Encounter: Payer: Self-pay | Admitting: Pharmacist

## 2023-10-18 ENCOUNTER — Encounter: Payer: Self-pay | Admitting: Orthopaedic Surgery

## 2023-10-18 VITALS — Wt 211.0 lb

## 2023-10-18 DIAGNOSIS — G8929 Other chronic pain: Secondary | ICD-10-CM | POA: Diagnosis not present

## 2023-10-18 DIAGNOSIS — M25561 Pain in right knee: Secondary | ICD-10-CM | POA: Diagnosis not present

## 2023-10-18 DIAGNOSIS — M1711 Unilateral primary osteoarthritis, right knee: Secondary | ICD-10-CM | POA: Diagnosis not present

## 2023-10-18 NOTE — Progress Notes (Signed)
 The patient is a 73 year old female who comes in for evaluation treatment of end-stage arthritis of her right knee.  She is sent to me by my partner Dr. Hermina Loosen who has replaced her right shoulder.  She has been dealing with worsening right knee pain for over a year now.  Actually saw her remotely in the past when we were out of her other office and placed a steroid injection in her left shoulder.  She has had steroid injections multiple times in her right knee as well as hyaluronic acid injections in the right knee.  At this point her right knee pain is daily and is a detriment affecting her mobility, her quality of life and her actives daily living.  She did have a small stroke back in April and she is a type II diabetic.  In March her hemoglobin A1c was 7.3.  She is on Plavix  and a baby aspirin  daily.  Examination of her right knee shows varus malalignment.  There is pain medially and laterally and pain throughout the arc of motion of the knee with patellofemoral crepitation as well.  The knee feels ligamentously stable.  She does have a known large Baker's cyst in the popliteal area of the knee and this was seen on ultrasound findings as well.  X-rays of the right knee on the canopy system show tricompartment arthritis with narrowing of all 3 compartments which is near bone-on-bone and osteophytes in all 3 compartments.  We had a long and thorough discussion about knee replacement surgery.  We discussed the risks and benefits of the surgery.  We discussed what to expect from an intraoperative and postoperative standpoint.  I showed her knee replacement model and her x-rays.  Family is with her in the room as well.  She said that she would like to think about this a little bit.  I did give her our surgery scheduler's card.  If she decides to proceed with surgery, we will have her stop Plavix  for 7 days prior to surgery.  All questions concerns were answered and addressed.  She said she will let us  know.

## 2023-10-18 NOTE — Progress Notes (Signed)
 Pharmacy Quality Measure Review  This patient is appearing on a report for being at risk of failing the adherence measure for cholesterol (statin) and hypertension (ACEi/ARB) medications this calendar year.   Medication: valsartan  Last fill date: 05/12/2023 for 90 day supply per adherence report Per Dr Anson Basta database was filled on 10/09/2023 for 90 days.   Medication: atorvastatin   Last fill date: 07/19/2023 for 90 day supply Per Dr Anson Basta database has 3 refills remaining and filled on of those on 10/17/2023  Insurance report was not up to date. No action needed at this time.   Cecilie Coffee, PharmD Clinical Pharmacist [facility]  Population Health [phone]

## 2023-10-19 ENCOUNTER — Other Ambulatory Visit: Payer: Self-pay | Admitting: Family Medicine

## 2023-10-19 DIAGNOSIS — G2581 Restless legs syndrome: Secondary | ICD-10-CM

## 2023-10-19 NOTE — Telephone Encounter (Signed)
 Requested Prescriptions   Pending Prescriptions Disp Refills   clonazePAM  (KLONOPIN ) 1 MG tablet [Pharmacy Med Name: CLONAZEPAM  1 MG TABLET] 30 tablet     Sig: TAKE 1 TABLET AT BEDTIME FOR RESTLESS LEGS SYNDROME     Date of patient request: 10/19/2023 Last office visit: 09/02/2023 Upcoming visit: 11/03/2023 Date of last refill: 06/16/2023 Last refill amount: 30

## 2023-11-03 ENCOUNTER — Encounter: Payer: Self-pay | Admitting: Family Medicine

## 2023-11-03 ENCOUNTER — Telehealth: Payer: Self-pay

## 2023-11-03 ENCOUNTER — Ambulatory Visit: Admitting: Family Medicine

## 2023-11-03 VITALS — BP 124/62 | HR 51 | Temp 97.9°F | Ht 62.0 in | Wt 216.1 lb

## 2023-11-03 DIAGNOSIS — Z794 Long term (current) use of insulin: Secondary | ICD-10-CM

## 2023-11-03 DIAGNOSIS — F419 Anxiety disorder, unspecified: Secondary | ICD-10-CM

## 2023-11-03 DIAGNOSIS — E113391 Type 2 diabetes mellitus with moderate nonproliferative diabetic retinopathy without macular edema, right eye: Secondary | ICD-10-CM

## 2023-11-03 DIAGNOSIS — F32A Depression, unspecified: Secondary | ICD-10-CM | POA: Diagnosis not present

## 2023-11-03 LAB — BASIC METABOLIC PANEL WITH GFR
BUN: 18 mg/dL (ref 6–23)
CO2: 27 meq/L (ref 19–32)
Calcium: 9.1 mg/dL (ref 8.4–10.5)
Chloride: 106 meq/L (ref 96–112)
Creatinine, Ser: 0.86 mg/dL (ref 0.40–1.20)
GFR: 66.99 mL/min (ref >60.00)
Glucose, Bld: 30 mg/dL — CL (ref 70–99)
Potassium: 3.9 meq/L (ref 3.5–5.1)
Sodium: 140 meq/L (ref 135–145)

## 2023-11-03 LAB — HEMOGLOBIN A1C: Hgb A1c MFr Bld: 7.4 % — ABNORMAL HIGH (ref 4.6–6.5)

## 2023-11-03 LAB — MICROALBUMIN / CREATININE URINE RATIO
Creatinine,U: 114.4 mg/dL
Microalb Creat Ratio: 734.8 mg/g — ABNORMAL HIGH (ref 0.0–30.0)
Microalb, Ur: 84 mg/dL — ABNORMAL HIGH (ref 0.0–1.9)

## 2023-11-03 MED ORDER — BUSPIRONE HCL 7.5 MG PO TABS: 7.5000 mg | ORAL_TABLET | Freq: Two times a day (BID) | ORAL | 3 refills | Status: DC

## 2023-11-03 NOTE — Progress Notes (Signed)
   Subjective:    Patient ID: Lacey [name], female    DOB: [birth year], 73 y.o.   MRN: [id number]  HPI DM- chronic problem, currently on Novolog  25 units TID, Lantus  60 units at bedtime.  UTD on eye exam, foot exam.  Due for microalbumin.  Last A1C 7.3%.  Anxiety/depression- pt continues to struggle w/ loss of her daughter.  Approaching 6 months since her death.  Reports grief will come in waves.  Admits to low motivation, not keeping up w/ things at home.  Has not done grief counseling.  Pt needs knee surgery but is upset b/c daughter is not here to care for her like she was for prior surgeries.   Review of Systems For ROS see HPI     Objective:   Physical Exam Vitals reviewed.  Constitutional:      General: She is not in acute distress.    Appearance: Normal appearance. She is well-developed. She is not ill-appearing.  HENT:     Head: Normocephalic and atraumatic.  Eyes:     Conjunctiva/sclera: Conjunctivae normal.     Pupils: Pupils are equal, round, and reactive to light.  Neck:     Thyroid : No thyromegaly.  Cardiovascular:     Rate and Rhythm: Normal rate and regular rhythm.     Heart sounds: Normal heart sounds. No murmur heard. Pulmonary:     Effort: Pulmonary effort is normal. No respiratory distress.     Breath sounds: Normal breath sounds.  Abdominal:     General: There is no distension.     Palpations: Abdomen is soft.     Tenderness: There is no abdominal tenderness.  Musculoskeletal:     Cervical back: Normal range of motion and neck supple.  Lymphadenopathy:     Cervical: No cervical adenopathy.  Skin:    General: Skin is warm and dry.  Neurological:     General: No focal deficit present.     Mental Status: She is alert and oriented to person, place, and time.  Psychiatric:     Comments: Tearful, anxious, overwhelmed           Assessment & Plan:

## 2023-11-03 NOTE — Telephone Encounter (Signed)
 Pt was asymptomatic in office and I think they were going to eat after their visit.  Please call to check and make sure she's feeling ok and to eat dinner tonight

## 2023-11-03 NOTE — Patient Instructions (Signed)
 Follow up in 1 month to recheck mood We'll notify you of your lab results and make any changes if needed ADD the Buspirone twice daily for anxiety No other med changes at this time Call with any questions or concerns Hang in there!!!

## 2023-11-03 NOTE — Telephone Encounter (Signed)
 Called patient and she Drank juice post blood draw and did have lunch when she left, notes she will eat dinner tonight but she is feeling fine.

## 2023-11-03 NOTE — Telephone Encounter (Signed)
 Lab called reported critical lab value   Glucose is 30 at the time of the draw

## 2023-11-04 ENCOUNTER — Ambulatory Visit: Payer: Self-pay | Admitting: Family Medicine

## 2023-11-04 NOTE — Telephone Encounter (Signed)
-----   Message from Comer Greet sent at 11/04/2023  7:29 AM EDT ----- I know you already spoke to  [facility] about your blood sugars- please make sure you are eating regularly!  Remainder of labs are stable and look good!  No changes at this time ----- Message ----- From: Interface, Lab In Three Zero One Sent: 11/03/2023   5:22 PM EDT To: Comer FORBES Greet, MD

## 2023-11-04 NOTE — Telephone Encounter (Signed)
 Spoke to patient and let her know that the rest of her labs were stable. She had no other questions at this time.

## 2023-11-06 ENCOUNTER — Other Ambulatory Visit: Payer: Self-pay | Admitting: Family Medicine

## 2023-11-08 ENCOUNTER — Encounter: Payer: Self-pay | Admitting: Neurology

## 2023-11-17 NOTE — Assessment & Plan Note (Signed)
 Deteriorated.  Pt is having a hard time w/ the loss of her daughter.  Has not done grief counseling and seems resistant.  Admits to low motivation and not keeping up w/ things around the house.  Will add Buspar  twice daily to help w/ anxiety.  Again encouraged counseling.  Will follow closely.

## 2023-11-17 NOTE — Assessment & Plan Note (Signed)
 Chronic problem.  UTD on eye exam, foot exam.  Microalbumin ordered.  Currently on Novolog  25 units TID and Lantus  60 units at bedtime.  Check labs.  Adjust meds prn

## 2023-11-24 ENCOUNTER — Ambulatory Visit (HOSPITAL_BASED_OUTPATIENT_CLINIC_OR_DEPARTMENT_OTHER): Admitting: Orthopaedic Surgery

## 2023-11-24 ENCOUNTER — Ambulatory Visit (HOSPITAL_BASED_OUTPATIENT_CLINIC_OR_DEPARTMENT_OTHER)

## 2023-11-24 DIAGNOSIS — Z96611 Presence of right artificial shoulder joint: Secondary | ICD-10-CM | POA: Diagnosis not present

## 2023-11-24 DIAGNOSIS — M25511 Pain in right shoulder: Secondary | ICD-10-CM | POA: Diagnosis not present
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
## 2023-11-26 ENCOUNTER — Other Ambulatory Visit: Payer: Self-pay | Admitting: Family Medicine

## 2023-12-01 ENCOUNTER — Other Ambulatory Visit: Payer: Self-pay | Admitting: Physician Assistant

## 2023-12-01 DIAGNOSIS — Z01818 Encounter for other preprocedural examination: Secondary | ICD-10-CM

## 2023-12-01 NOTE — Progress Notes (Signed)
 Assessment/Plan:    Tremor Inderal  LA was effective, but she had significant bradycardia and it had to be discontinued. She did have an abnormal DaTscan , albeit slightly so when I reviewed it.  She did not look parkinsonian today or previous visits Skin biopsy was negative for alpha-synuclein.  This may mean she is at risk for a tauopathy (again, atypical state) but not idiopathic Parkinsons Disease.  However, she does not look like she has an atypical state continue  primidone , 50 mg, 2 in the AM, 2 in the evening.  She is happy with efficacy 2.  History of cerebral infarction, December 2022 and April 2025  -She is on aspirin , 81 mg daily.    -Talked about importance of blood pressure control with a goal <130/80 mm Hg.   - She is on Lipitor, 20 mg daily.    - long discussion again re: tobacco cessation  2.   Hx of nephrolithiasis             -Drugs like topiramate and Zonegran would not be able to be used because of this for her tremor.  3. Myoclonus  -better with decrease in gabapentin  dose.  4.  Tobacco abuse  -discussed importance of cessation 5.  Adjustment disorder  - Due to death of daughter from appendiceal CA  -discussed counseling and hospice grief services.    -tx with buspar  but pt not taking regularly due to palpitations.  F/u pcp.  Has appt in 3 days 6.  Shoulder pain  -getting ready for another shoulder surgery on the R Subjective:   Lacey [name] was worked into the office today.   Patient continues on primidone  for her tremor.  She is doing well in that regard.  Last visit, she had had a cerebral infarct in April, 2025.  We added Plavix  in addition to her aspirin  for 90 days and then back to aspirin  alone.  She wanted to continue dual therapy, but we told her that would not be indicated and bleeding risks were too high.  She had a carotid ultrasound done since last visit which was unremarkable.  She had an echo with bubble study.  Her echo was unremarkable.  Her left  ventricular ejection fraction was 55 to 60%.  We strongly recommended that she discontinue tobacco.  She has done occupational therapy since our last visit and those notes are reviewed.  She saw Dr. Mahlon July 2.  Notes are reviewed.  Patient continuing to have struggles because of the death of her daughter from appendiceal CA.  She was given buspar  but states that it causes palpitations so not taking it regularly for few days and has an appt on friday.  She also noted more tremor in the L hand with that medication.  She does state that she plans to have another shoulder sx due to continued pain since last shoulder sx over a year ago.   Is still smoking.  Current movement disorder medications: Primidone , 50 mg, 2 po bid  Aspirin , 81 mg daily  Prior medications: Propranolol  (bradycardia)   ALLERGIES:  No Known Allergies  CURRENT MEDICATIONS:  Outpatient Encounter Medications as of 12/07/2023  Medication Sig   Alcohol Swabs (DROPSAFE ALCOHOL PREP) 70 % PADS USE AS DIRECTED  AS NEEDED.   aspirin  EC 81 MG tablet Take 1 tablet (81 mg total) by mouth daily.   atorvastatin  (LIPITOR) 40 MG tablet Take 1 tablet (40 mg total) by mouth daily.   B-D ULTRAFINE III SHORT  PEN 31G X 8 MM MISC USE 4 TIMES A DAY   Blood Glucose Monitoring Suppl (TRUE METRIX METER) w/Device KIT Use as directed 3-4 times daily.  Dx- type 2 diabetes w/ use of insulin    busPIRone  (BUSPAR ) 7.5 MG tablet TAKE 1 TABLET BY MOUTH 2 TIMES DAILY.   calcium -vitamin D  (OSCAL WITH D) 500-5 MG-MCG tablet Take 1 tablet by mouth.   clonazePAM  (KLONOPIN ) 1 MG tablet TAKE 1 TABLET AT BEDTIME FOR RESTLESS LEGS SYNDROME   clopidogrel  (PLAVIX ) 75 MG tablet Take 1 tablet (75 mg total) by mouth daily.   diclofenac  Sodium (VOLTAREN ) 1 % GEL APPLY 2 GRAMS TO AFFECTED AREA 4 TIMES A DAY   fenofibrate  160 MG tablet TAKE 1 TABLET DAILY   furosemide  (LASIX ) 20 MG tablet TAKE 1 TABLET EVERY DAY   gabapentin  (NEURONTIN ) 300 MG capsule Take 900 mg by mouth 2  (two) times daily.   glucose blood test strip 1 each by Other route as needed for other. Test 4 times daily   glucose blood test strip 1 each by Other route 4 (four) times daily - after meals and at bedtime. Use as instructed   insulin  aspart (NOVOLOG  FLEXPEN) 100 UNIT/ML FlexPen INJECT 25 UNITS SUBCUTANEOUSLY THREE TIMES DAILY WITH MEALS   insulin  glargine (LANTUS  SOLOSTAR) 100 UNIT/ML Solostar Pen INJECT 60 UNITS UNDER THE SKIN AT BEDTIME   Lancets Super Thin 28G MISC Please use as directed to test sugars 4 times daily. Dx. E11.9   lansoprazole (PREVACID) 15 MG capsule Take 15 mg by mouth daily before breakfast.   levothyroxine  (SYNTHROID ) 50 MCG tablet TAKE 1 TABLET BY MOUTH EVERY DAY BEFORE BREAKFAST   loperamide (IMODIUM A-D) 2 MG tablet Take 2 mg by mouth daily as needed for diarrhea or loose stools.   OneTouch UltraSoft 2 Lancets MISC 1 Device by Does not apply route 4 (four) times daily.   primidone  (MYSOLINE ) 50 MG tablet TAKE 2 TABLETS BY MOUTH 2 TIMES DAILY.   promethazine -dextromethorphan (PROMETHAZINE -DM) 6.25-15 MG/5ML syrup Take 5 mLs by mouth 4 (four) times daily as needed. (Patient not taking: Reported on 11/03/2023)   sertraline  (ZOLOFT ) 100 MG tablet Take 1 tablet (100 mg total) by mouth daily.   traZODone  (DESYREL ) 100 MG tablet TAKE 1 TABLET AT BEDTIME   trimethoprim (TRIMPEX) 100 MG tablet Take by mouth.   valsartan  (DIOVAN ) 320 MG tablet TAKE 1 TABLET DAILY   verapamil  (CALAN -SR) 120 MG CR tablet TAKE 1 TABLET BY MOUTH EVERYDAY AT BEDTIME   No facility-administered encounter medications on file as of 12/07/2023.    Objective:   PHYSICAL EXAMINATION:    VITALS:   There were no vitals filed for this visit.   GEN:  The patient appears stated age and is in NAD. HEENT:  Normocephalic, atraumatic.  The mucous membranes are moist. The superficial temporal arteries are without ropiness or tenderness. CV:  RRR Lungs: Clear to auscultation bilaterally Neck/HEME:  There are  no carotid bruits bilaterally.   Neurological examination:   Orientation: The patient is alert and oriented x3.  Cranial nerves: There is good facial symmetry.  Extraocular muscles are intact. The visual fields are full to confrontational testing. The speech is fluent and clear. Soft palate rises symmetrically and there is no tongue deviation. Hearing is intact to conversational tone. Sensation: Sensation is intact to light touch throughout  Motor: Strength is at least antigravity x 4.     Movement examination: Tone: There is normal tone in the bilateral upper extremities.  The tone in the lower extremities is normal.  Abnormal movements: no rest tremor today.  No trouble with archimedes spirals.  No postural tremor.  Min intention tremor on the R.  This is all stable.   Coordination:  There is no decremation with RAM's, with any form of RAMS, including alternating supination and pronation of the forearm, hand opening and closing, finger taps, heel taps and toe taps. Gait and station:  slow and tenuous but steady   I have reviewed and interpreted the following labs independently    Chemistry      Component Value Date/Time   NA 140 11/03/2023 1058   NA 142 11/27/2021 1107   K 3.9 11/03/2023 1058   CL 106 11/03/2023 1058   CO2 27 11/03/2023 1058   BUN 18 11/03/2023 1058   BUN 15 11/27/2021 1107   CREATININE 0.86 11/03/2023 1058   CREATININE 0.89 11/18/2012 1433      Component Value Date/Time   CALCIUM  9.1 11/03/2023 1058   ALKPHOS 48 08/09/2023 1522   AST 16 08/09/2023 1522   ALT 9 08/09/2023 1522   BILITOT 0.7 08/09/2023 1522       Lab Results  Component Value Date   WBC 8.2 08/09/2023   HGB 13.0 08/09/2023   HCT 39.8 08/09/2023   MCV 97.3 08/09/2023   PLT 190 08/09/2023    Lab Results  Component Value Date   TSH 2.45 08/02/2023   Lab Results  Component Value Date   CHOL 159 08/02/2023   HDL 63.00 08/02/2023   LDLCALC 79 08/02/2023   LDLDIRECT 86.0 05/02/2015    TRIG 84.0 08/02/2023   CHOLHDL 3 08/02/2023   Lab Results  Component Value Date   HGBA1C 7.4 (H) 11/03/2023   Total time spent on today's visit was 30 minutes, including both face-to-face time and nonface-to-face time.  Time included that spent on review of records (prior notes available to me/labs/imaging if pertinent), discussing treatment and goals, answering patient's questions and coordinating care.   Cc:  Tabori, Katherine E, MD

## 2023-12-02 ENCOUNTER — Other Ambulatory Visit (HOSPITAL_BASED_OUTPATIENT_CLINIC_OR_DEPARTMENT_OTHER): Payer: Self-pay

## 2023-12-02 ENCOUNTER — Ambulatory Visit (HOSPITAL_BASED_OUTPATIENT_CLINIC_OR_DEPARTMENT_OTHER): Payer: Self-pay | Admitting: Orthopaedic Surgery

## 2023-12-02 ENCOUNTER — Ambulatory Visit (HOSPITAL_BASED_OUTPATIENT_CLINIC_OR_DEPARTMENT_OTHER): Admitting: Orthopaedic Surgery

## 2023-12-02 DIAGNOSIS — Z96611 Presence of right artificial shoulder joint: Secondary | ICD-10-CM

## 2023-12-02 MED ORDER — OXYCODONE HCL 5 MG PO TABS
5.0000 mg | ORAL_TABLET | ORAL | 0 refills | Status: DC | PRN
  Filled 2023-12-02: qty 30, 5d supply, fill #0

## 2023-12-02 MED ORDER — ASPIRIN 325 MG PO TBEC
325.0000 mg | DELAYED_RELEASE_TABLET | Freq: Every day | ORAL | 0 refills | Status: DC
  Filled 2023-12-02: qty 14, 14d supply, fill #0

## 2023-12-02 MED ORDER — ACETAMINOPHEN 500 MG PO TABS
500.0000 mg | ORAL_TABLET | Freq: Three times a day (TID) | ORAL | 0 refills | Status: AC
  Filled 2023-12-02: qty 30, 10d supply, fill #0
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
## 2023-12-03 ENCOUNTER — Telehealth: Payer: Self-pay | Admitting: Family Medicine

## 2023-12-03 ENCOUNTER — Telehealth: Payer: Self-pay

## 2023-12-03 NOTE — Telephone Encounter (Signed)
 Obtained forms from front desk and placed in providers folder for review at nurse station in POD B

## 2023-12-03 NOTE — Telephone Encounter (Signed)
...     Pre-operative Risk Assessment    Patient Name: Oluwademilade Kellett  DOB: 13-Jul-1950 MRN: [id number]   Date of last office visit: 07/19/23 Date of next office visit: NONE   Request for Surgical Clearance    Procedure:  RIGHT REVERSE SHOULDER ARTHOPLASTY REVISION  Date of Surgery:  Clearance TBD                                Surgeon:  ELSPETH PARKER Surgeon's Group or Practice Name:  [facility] Phone number:  [phone] Fax number:  [phone]   Type of Clearance Requested:   - Medical  - Pharmacy:  Hold Aspirin  and Clopidogrel  (Plavix ) WOULD LIKE TO HOLD ASPIRIN  7 DAYS AND HOLD PLAVIX  FOR 5 DAYS BEFORE SURGERY   Type of Anesthesia:  General    Additional requests/questions:    Bonney Teressa Rumalda Ronal   12/03/2023, 3:27 PM

## 2023-12-03 NOTE — Telephone Encounter (Signed)
   Name: Lacey [name]  DOB: [birth year]  MRN: [id number]  Primary Cardiologist: Candyce Reek, MD  Chart reviewed as part of pre-operative protocol coverage. Because of Corina Stacy past medical history and time since last visit, she will require a follow-up telephone visit in order to better assess preoperative cardiovascular risk.  Pre-op covering staff: - Please schedule appointment and call patient to inform them. If patient already had an upcoming appointment within acceptable timeframe, please add pre-op clearance to the appointment notes so provider is aware. - Please contact requesting surgeon's office via preferred method (i.e, phone, fax) to inform them of need for appointment prior to surgery.  We do not prescribe plavix  or ASA. Plavix  is for stroke hx. Would recommend reaching out to neuro. Patient can hold ASA x 5-7 days from a cardiac standpoint if no new symptoms at the time of phone call.    Orren LOISE Fabry, PA-C  12/03/2023, 3:41 PM

## 2023-12-03 NOTE — Telephone Encounter (Signed)
 Type of form received: Surgical Clearance  Additional comments:   Received by: Fax  Form should be Faxed/mailed to: (address/ fax #) [phone]  Is patient requesting call for pickup: N/A  Form placed:  Labeled & placed in provider bin  Attach charge sheet.  Provider will determine charge.  Individual made aware of 3-5 business day turn around? N/A

## 2023-12-05 ENCOUNTER — Other Ambulatory Visit: Payer: Self-pay | Admitting: Family Medicine

## 2023-12-06 ENCOUNTER — Telehealth: Payer: Self-pay

## 2023-12-06 NOTE — Telephone Encounter (Signed)
  Patient Consent for Virtual Visit        Lacey [name] has provided verbal consent on 12/06/2023 for a virtual visit (video or telephone).   CONSENT FOR VIRTUAL VISIT FOR:  Lacey [name]  By participating in this virtual visit I agree to the following:  I hereby voluntarily request, consent and authorize [facility] and its employed or contracted physicians, physician assistants, nurse practitioners or other licensed health care professionals (the Practitioner), to provide me with telemedicine health care services (the "Services) as deemed necessary by the treating Practitioner. I acknowledge and consent to receive the Services by the Practitioner via telemedicine. I understand that the telemedicine visit will involve communicating with the Practitioner through live audiovisual communication technology and the disclosure of certain medical information by electronic transmission. I acknowledge that I have been given the opportunity to request an in-person assessment or other available alternative prior to the telemedicine visit and am voluntarily participating in the telemedicine visit.  I understand that I have the right to withhold or withdraw my consent to the use of telemedicine in the course of my care at any time, without affecting my right to future care or treatment, and that the Practitioner or I may terminate the telemedicine visit at any time. I understand that I have the right to inspect all information obtained and/or recorded in the course of the telemedicine visit and may receive copies of available information for a reasonable fee.  I understand that some of the potential risks of receiving the Services via telemedicine include:  Delay or interruption in medical evaluation due to technological equipment failure or disruption; Information transmitted may not be sufficient (e.g. poor resolution of images) to allow for appropriate medical decision making by the Practitioner; and/or   In rare instances, security protocols could fail, causing a breach of personal health information.  Furthermore, I acknowledge that it is my responsibility to provide information about my medical history, conditions and care that is complete and accurate to the best of my ability. I acknowledge that Practitioner's advice, recommendations, and/or decision may be based on factors not within their control, such as incomplete or inaccurate data provided by me or distortions of diagnostic images or specimens that may result from electronic transmissions. I understand that the practice of medicine is not an exact science and that Practitioner makes no warranties or guarantees regarding treatment outcomes. I acknowledge that a copy of this consent can be made available to me via my patient portal [facility] MyChart), or I can request a printed copy by calling the office of  [facility].    I understand that my insurance will be billed for this visit.   I have read or had this consent read to me. I understand the contents of this consent, which adequately explains the benefits and risks of the Services being provided via telemedicine.  I have been provided ample opportunity to ask questions regarding this consent and the Services and have had my questions answered to my satisfaction. I give my informed consent for the services to be provided through the use of telemedicine in my medical care

## 2023-12-06 NOTE — Telephone Encounter (Signed)
 Pt scheduled for VV on 12/14/23

## 2023-12-06 NOTE — Telephone Encounter (Signed)
 Forms were brought to me by [facility] needing clarification about the workflow for pre-surgical appts. Reviewed this patient's appt desk and see that she has an appt scheduled on 8/8 for mood follow up. Most times, surgical clearances need a separate appt but would like to get some clarification on that, especially when patients have an upcoming appt.   Dr. Mahlon, for this patient would you like us  to contact her and get her scheduled for a separate surgical clearance appt?

## 2023-12-07 ENCOUNTER — Encounter: Payer: Self-pay | Admitting: Neurology

## 2023-12-07 ENCOUNTER — Other Ambulatory Visit: Payer: Self-pay | Admitting: Neurology

## 2023-12-07 ENCOUNTER — Ambulatory Visit: Payer: Medicare HMO | Admitting: Neurology

## 2023-12-07 ENCOUNTER — Other Ambulatory Visit: Payer: Self-pay

## 2023-12-07 VITALS — BP 150/80 | HR 58 | Ht 62.0 in | Wt 219.8 lb

## 2023-12-07 DIAGNOSIS — R3915 Urgency of urination: Secondary | ICD-10-CM | POA: Diagnosis not present

## 2023-12-07 DIAGNOSIS — R251 Tremor, unspecified: Secondary | ICD-10-CM

## 2023-12-07 DIAGNOSIS — N3941 Urge incontinence: Secondary | ICD-10-CM | POA: Diagnosis not present

## 2023-12-07 DIAGNOSIS — Z720 Tobacco use: Secondary | ICD-10-CM | POA: Diagnosis not present

## 2023-12-07 DIAGNOSIS — N3020 Other chronic cystitis without hematuria: Secondary | ICD-10-CM | POA: Diagnosis not present

## 2023-12-07 NOTE — Telephone Encounter (Signed)
 Patient has an appt with Dr.Rebecca Tat at 9:15am this am

## 2023-12-07 NOTE — Patient Instructions (Signed)
 You are doing really well.  I do want you to work on tobacco cessation.  I also want you to think about going to hospice grief counseling.  It was so good to see you!  The physicians and staff at [facility] are committed to providing excellent care. You may receive a survey requesting feedback about your experience at our office. We strive to receive very good responses to the survey questions. If you feel that your experience would prevent you from giving the office a very good  response, please contact our office to try to remedy the situation. We may be reached at [phone]. Thank you for taking the time out of your busy day to complete the survey.

## 2023-12-10 ENCOUNTER — Ambulatory Visit (INDEPENDENT_AMBULATORY_CARE_PROVIDER_SITE_OTHER): Admitting: Family Medicine

## 2023-12-10 ENCOUNTER — Encounter: Payer: Self-pay | Admitting: Family Medicine

## 2023-12-10 VITALS — BP 152/72 | HR 57 | Temp 98.0°F | Ht 62.0 in | Wt 217.4 lb

## 2023-12-10 DIAGNOSIS — F32A Depression, unspecified: Secondary | ICD-10-CM

## 2023-12-10 DIAGNOSIS — F419 Anxiety disorder, unspecified: Secondary | ICD-10-CM | POA: Diagnosis not present

## 2023-12-10 NOTE — Assessment & Plan Note (Signed)
 Improving.  Today she is w/ her great granddaughters- out of the house, smiling, bathed/dressed.  I reassured her that it is safe to take the Buspar  with her current meds and it seems to be helping.  She agrees.  No changes at this time.

## 2023-12-10 NOTE — Telephone Encounter (Signed)
 She will need cardiac clearance so I can clear her medically based on past visits

## 2023-12-10 NOTE — Progress Notes (Signed)
   Subjective:    Patient ID: Lacey [name], female    DOB: [birth year], 73 y.o.   MRN: [id number]  HPI Anxiety/depression- at last visit the plan was to add Buspar .  She reports she is not taking this as she should b/c the pharmacist told her she was concerned about medication interactions (Primidone  and Verapamil ).  Primidone  can increase metabolism of Buspar  and Verapamil  can decrease metabolism of Buspar .  Pt's appetite is returning.  Is spending time w/ great grandchildren.     Review of Systems For ROS see HPI     Objective:   Physical Exam Vitals reviewed.  Constitutional:      General: She is not in acute distress.    Appearance: Normal appearance. She is not ill-appearing.  Skin:    General: Skin is warm and dry.  Neurological:     Mental Status: She is alert and oriented to person, place, and time. Mental status is at baseline.  Psychiatric:        Mood and Affect: Mood normal.        Behavior: Behavior normal.        Thought Content: Thought content normal.           Assessment & Plan:

## 2023-12-10 NOTE — Patient Instructions (Signed)
 Schedule your complete physical in 3 months No need for blood work today Take the Buspar  twice daily- this is safe Call with any questions or concerns Stay Safe!  Stay Healthy!

## 2023-12-12 ENCOUNTER — Other Ambulatory Visit: Payer: Self-pay | Admitting: Neurology

## 2023-12-13 NOTE — Telephone Encounter (Signed)
 Faxed back with appt notes

## 2023-12-14 ENCOUNTER — Ambulatory Visit: Attending: Cardiology

## 2023-12-14 DIAGNOSIS — Z01810 Encounter for preprocedural cardiovascular examination: Secondary | ICD-10-CM

## 2023-12-14 NOTE — Progress Notes (Signed)
 Virtual Visit via Telephone Note   Because of Lacey [name] co-morbid illnesses, she is at least at moderate risk for complications without adequate follow up.  This format is felt to be most appropriate for this patient at this time.  Due to technical limitations with video connection (technology), today's appointment will be conducted as an audio only telehealth visit, and Lacey [name] verbally agreed to proceed in this manner.   All issues noted in this document were discussed and addressed.  No physical exam could be performed with this format.  Evaluation Performed:  Preoperative cardiovascular risk assessment _____________   Date:  12/14/2023   Patient ID:  Lacey [name], DOB [birth year], MRN [id number] Patient Location:  Home Provider location:   Office  Primary Care Provider:  Mahlon Comer BRAVO, MD Primary Cardiologist:  Lacey Reek, MD  Chief Complaint / Patient Profile  73 y.o. y/o female with a h/o nonobstructive coronary artery disease, SVT, hypertension, hyperlipidemia, type 2 diabetes mellitus, diastolic heart failure, TIA who is pending right reverse shoulder arthroplasty revision with Lacey [name] and presents today for telephonic preoperative cardiovascular risk assessment. History of Present Illness  Lacey [name] is a 73 y.o. female who presents via audio/video conferencing for a telehealth visit today.  Pt was last seen in cardiology clinic on 07/19/23 by Lacey [name].  At that time Lacey [name] was doing well.  The patient is now pending procedure as outlined above. Since her last visit, she has remained stable from a cardiac standpoint. Per notes she had a CVA in April, 2025, her echocardiogram indicated LVEF 55 to 60%, no RWMA, mild LVH, diastolic parameters were normal, RV systolic function and size was normal, saline contrast bubble study was negative with no evidence of interatrial shunt.  Her cardiac monitor showed an average heart rate of 57 bpm, ranging from 39  bpm to 276 bpm, predominant underlying rhythm was sinus rhythm.  She had 23 episodes of supraventricular tachycardia, there was no evidence of atrial fibrillation.  Carotid Dopplers showed minimal heterogenous plaque with no hemodynamically significant stenosis. She is on aspirin  and plavix  per neurology.   Today she denies chest pain, shortness of breath, lower extremity edema, fatigue, palpitations, melena, hematuria, hemoptysis, diaphoresis, weakness, presyncope, syncope, orthopnea, and PND. She is able to achieve greater than 4 METs of activity, she enjoys playing with great grandchildren, she completes household chores, and she walks regularly in the grocery store Past Medical History    Past Medical History:  Diagnosis Date   Adenomatous colon polyp    hyperplastic   Anemia    Anginal pain (HCC)    not recent   Arthritis    NECK, KNEES, FINGERS, TOES   Atrial tachycardia (HCC) CARDIOLOGIST - DR [name] (LAST VISIT AUG 2012)   Echo 12/11: EF 55-60%, Mild LVH, grade 1 diast dysfxn;   holter 1/12: ATach   Atypical chest pain    a. 07/2004 Cath: Clean cors;  b. 04/2010 Myoview : EF 63%, no ischemia   Blood transfusion without reported diagnosis 1969   CHF (congestive heart failure) (HCC)    Chronic kidney disease    left kidney small    Coronary artery disease    Diabetes mellitus, type 2 (HCC)    ORAL AND INSULIN  MEDS (LAST A1C  7.3)   Diverticulosis    Erythema CURRENT-- CLOSED ABD. WALL ABSCESS   PER PCP NOTE (03-31-11)- MRSA-- TAKES DOXYCYCLINE    GERD (gastroesophageal reflux disease)    CONTROLLED W/ PREVACID  History of kidney stones 2012   Hyperlipidemia    Hypertension    Insomnia    TAKES MEDS PRN   Neuropathy, peripheral    Obesity (BMI 30-39.9) 02/19/2015   Pneumonia    as child   Post op infection 11/07/2012   left bunionectomy   Restless leg syndrome    Sepsis, urinary HISTORY - 2004   Sleep apnea    no cpap    Thyroid  disease    TIA (transient ischemic  attack)    mini strokes 2023   Tremor    Vitamin D  deficiency    Past Surgical History:  Procedure Laterality Date   ABDOMINAL HYSTERECTOMY  1995   ovaries remain   BUNIONECTOMY Left 10/24/2012   BUNIONECTOMY Right 05/17/2017   CARDIAC CATHETERIZATION  07/10/2004   CARPAL TUNNEL RELEASE  2000   RIGHT   CATARACT EXTRACTION, BILATERAL     CERVICAL FUSION  10/12/2007   C5 - 7   COLONOSCOPY     CYSTOSCOPY WITH RETROGRADE PYELOGRAM, URETEROSCOPY AND STENT PLACEMENT Left 11/28/2012   Procedure: LEFT RETROGRADE PYELOGRAM, LEFT URETEROSCOPY, ;  Surgeon: Lacey C Ottelin, MD;  Location: [facility];  Service: Urology;  Laterality: Left;   CYSTOSCOPY/RETROGRADE/URETEROSCOPY/STONE EXTRACTION WITH BASKET  X2 2004 & 2009   LEFT    GASTRIC BYPASS  1981   KNEE ARTHROSCOPY  12/2010   LEFT   LAPAROSCOPIC CHOLECYSTECTOMY  2001   LEFT HEART CATH AND CORONARY ANGIOGRAPHY N/A 12/03/2021   Procedure: LEFT HEART CATH AND CORONARY ANGIOGRAPHY;  Surgeon: Lacey Lacey RAMAN, MD;  Location: [facility] INVASIVE CV LAB;  Service: Cardiovascular;  Laterality: N/A;   left thumb joint surgery  2013   PERCUTANEOUS NEPHROSTOLITHOTOMY  02/27/2011   LEFT   POLYPECTOMY     REVERSE SHOULDER ARTHROPLASTY Right 07/12/2022   Procedure: REVERSE SHOULDER ARTHROPLASTY;  Surgeon: Lacey Standing, MD;  Location: [facility];  Service: Orthopedics;  Laterality: Right;   RIGHT THUMB JOINT SURG.  09/2009   TRIGGER FINGER RELEASE  2010   RIGHT THUMB   UPPER GASTROINTESTINAL ENDOSCOPY     URETERAL STENT PLACEMENT  X2  2004  &  2009   LEFT   URETEROSCOPY  04/06/2011   Procedure: URETEROSCOPY;  Surgeon: Oneil JAYSON Rafter, MD;  Location[facility];  Service: Urology;  Laterality: Left;  L Ureteroscopy Laser Litho & Stent    Allergies No Known Allergies Home Medications    Prior to Admission medications   Medication Sig Start Date End Date Taking? Authorizing Provider  Alcohol Swabs (DROPSAFE ALCOHOL PREP) 70 % PADS USE AS  DIRECTED  AS NEEDED. 08/15/21   Tabori, Katherine E, MD  aspirin  EC 325 MG tablet Take 1 tablet (325 mg total) by mouth daily. 12/02/23   Lacey Standing, MD  aspirin  EC 81 MG tablet Take 1 tablet (81 mg total) by mouth daily. 08/13/22   Arlice Reichert, MD  atorvastatin  (LIPITOR) 40 MG tablet Take 1 tablet (40 mg total) by mouth daily. 07/19/23   O'Neal, Darryle Ned, MD  B-D ULTRAFINE III SHORT PEN 31G X 8 MM MISC USE 4 TIMES A DAY 05/17/23   Tabori, Katherine E, MD  Blood Glucose Monitoring Suppl (TRUE METRIX METER) w/Device KIT Use as directed 3-4 times daily.  Dx- type 2 diabetes w/ use of insulin  09/14/22   Tabori, Katherine E, MD  busPIRone  (BUSPAR ) 7.5 MG tablet TAKE 1 TABLET BY MOUTH 2 TIMES DAILY. Patient not taking: Reported on 12/10/2023 11/26/23   Lacey Crank  E, MD  calcium -vitamin D  (OSCAL WITH D) 500-5 MG-MCG tablet Take 1 tablet by mouth.    [provider]  clonazePAM  (KLONOPIN ) 1 MG tablet TAKE 1 TABLET AT BEDTIME FOR RESTLESS LEGS SYNDROME 10/19/23   Tabori, Katherine E, MD  clopidogrel  (PLAVIX ) 75 MG tablet Take 1 tablet (75 mg total) by mouth daily. 08/12/23   Tat, Asberry RAMAN, DO  diclofenac  Sodium (VOLTAREN ) 1 % GEL APPLY 2 GRAMS TO AFFECTED AREA 4 TIMES A DAY 06/06/20   Tabori, Katherine E, MD  fenofibrate  160 MG tablet TAKE 1 TABLET DAILY 07/05/23   Tabori, Katherine E, MD  furosemide  (LASIX ) 20 MG tablet TAKE 1 TABLET EVERY DAY 01/21/22   Tabori, Katherine E, MD  gabapentin  (NEURONTIN ) 300 MG capsule Take 900 mg by mouth 2 (two) times daily.    [provider]  glucose blood test strip 1 each by Other route as needed for other. Test 4 times daily    [provider]  glucose blood test strip 1 each by Other route 4 (four) times daily - after meals and at bedtime. Use as instructed 07/09/23   Tabori, Katherine E, MD  insulin  aspart (NOVOLOG  FLEXPEN) 100 UNIT/ML FlexPen INJECT 25 UNITS SUBCUTANEOUSLY THREE TIMES DAILY WITH MEALS 12/16/22   Tabori, Katherine E, MD   insulin  glargine (LANTUS  SOLOSTAR) 100 UNIT/ML Solostar Pen INJECT 60 UNITS UNDER THE SKIN AT BEDTIME 11/08/23   Tabori, Katherine E, MD  Lancets Super Thin 28G MISC Please use as directed to test sugars 4 times daily. Dx. E11.9 08/14/19   Tabori, Katherine E, MD  lansoprazole (PREVACID) 15 MG capsule Take 15 mg by mouth daily before breakfast.    [provider]  levothyroxine  (SYNTHROID ) 50 MCG tablet TAKE 1 TABLET BY MOUTH EVERY DAY BEFORE BREAKFAST 09/20/23   Tabori, Katherine E, MD  loperamide (IMODIUM A-D) 2 MG tablet Take 2 mg by mouth daily as needed for diarrhea or loose stools.    [provider]  OneTouch UltraSoft 2 Lancets MISC 1 Device by Does not apply route 4 (four) times daily. 10/02/22   Tabori, Katherine E, MD  oxyCODONE  (ROXICODONE ) 5 MG immediate release tablet Take 1 tablet (5 mg total) by mouth every 4 (four) hours as needed for severe pain (pain score 7-10) or breakthrough pain. 12/02/23   Lacey Standing, MD  primidone  (MYSOLINE ) 50 MG tablet TAKE 2 TABLETS BY MOUTH 2 TIMES DAILY. 12/07/23   Tat, Asberry RAMAN, DO  promethazine -dextromethorphan (PROMETHAZINE -DM) 6.25-15 MG/5ML syrup Take 5 mLs by mouth 4 (four) times daily as needed. 07/01/23   Tabori, Katherine E, MD  sertraline  (ZOLOFT ) 100 MG tablet Take 1 tablet (100 mg total) by mouth daily. 03/18/23   Tabori, Katherine E, MD  traZODone  (DESYREL ) 100 MG tablet TAKE 1 TABLET AT BEDTIME 07/05/23   Tabori, Katherine E, MD  trimethoprim (TRIMPEX) 100 MG tablet Take by mouth. 07/16/23   [provider]  valsartan  (DIOVAN ) 320 MG tablet TAKE 1 TABLET DAILY 03/01/23   Tabori, Katherine E, MD  verapamil  (CALAN -SR) 120 MG CR tablet TAKE 1 TABLET BY MOUTH EVERYDAY AT BEDTIME 12/06/23   Lacey Comer BRAVO, MD    Physical Exam  Vital Signs:  Lacey [name] does not have vital signs available for review today. Given telephonic nature of communication, physical exam is limited. AAOx3. NAD. Normal affect.  Speech and  respirations are unlabored. Accessory Clinical Findings   None Assessment & Plan    1.  Preoperative Cardiovascular Risk Assessment:  Lacey [name] perioperative risk of a major cardiac event is 11% according to the Revised Cardiac Risk Index (RCRI). Her functional capacity is fair at 4.95 METs according to the [place] Activity Status Index (DASI). Recommendations: According to ACC/AHA guidelines, no further cardiovascular testing needed.  The patient may proceed to surgery at acceptable risk.   Antiplatelet and/or Anticoagulation Recommendations: From a cardiac standpoint patient can hold aspirin  for 5 to 7 days prior to procedure. Howeveer patient is currently on aspirin  and Plavix  given CVA in April 2025.  She is on aspirin  and Plavix  per neurology, final recommendations regarding holding of aspirin  and Plavix  will need to come from prescribing office.    The patient was advised that if she develops new symptoms prior to surgery to contact our office to arrange for a follow-up visit, and she verbalized understanding.  A copy of this note will be routed to requesting surgeon.  Time:   Today, I have spent 14 minutes with the patient with telehealth technology discussing medical history, symptoms, and management plan.    Kellyanne Ellwanger D Eleanor Dimichele, NP  12/14/2023, 10:50 AM

## 2023-12-15 DIAGNOSIS — M2022 Hallux rigidus, left foot: Secondary | ICD-10-CM | POA: Diagnosis not present

## 2023-12-15 DIAGNOSIS — E1151 Type 2 diabetes mellitus with diabetic peripheral angiopathy without gangrene: Secondary | ICD-10-CM | POA: Diagnosis not present

## 2023-12-15 DIAGNOSIS — R2689 Other abnormalities of gait and mobility: Secondary | ICD-10-CM | POA: Diagnosis not present

## 2023-12-15 DIAGNOSIS — M21961 Unspecified acquired deformity of right lower leg: Secondary | ICD-10-CM | POA: Diagnosis not present

## 2023-12-15 DIAGNOSIS — M2021 Hallux rigidus, right foot: Secondary | ICD-10-CM | POA: Diagnosis not present

## 2023-12-15 DIAGNOSIS — I739 Peripheral vascular disease, unspecified: Secondary | ICD-10-CM | POA: Diagnosis not present

## 2023-12-15 NOTE — Telephone Encounter (Signed)
 Notes on August 05 appointment state patient take for 90 days and then aspirin  alone. Patient had originally been prescribed the plavix  on 08/12/23. Not sure if I should send in another refill she should be done with plavix .

## 2023-12-17 ENCOUNTER — Other Ambulatory Visit: Payer: Self-pay

## 2023-12-17 DIAGNOSIS — E119 Type 2 diabetes mellitus without complications: Secondary | ICD-10-CM | POA: Diagnosis not present

## 2023-12-25 ENCOUNTER — Other Ambulatory Visit: Payer: Self-pay | Admitting: Family Medicine

## 2023-12-25 DIAGNOSIS — I10 Essential (primary) hypertension: Secondary | ICD-10-CM

## 2023-12-29 ENCOUNTER — Telehealth: Payer: Self-pay | Admitting: Orthopaedic Surgery

## 2023-12-29 NOTE — Telephone Encounter (Signed)
 Patient got a letter from [place] stating her doctor's office discontinued the request for surgery. Patient is very concerned this is not going to be approved. Due to her circumstances patient has to arrange for someone to stay with and take care of her spouse so she can have this surgery. I did relay message to patient that you are still trying  to get on [employer] website to submit request. Please advise me or patient once shara has been received.

## 2023-12-30 NOTE — Telephone Encounter (Signed)
 No patient does not have to be admit. When I posted case our system would not let me post it saying that code is on the admit only list for insurance. I had one of the other ladies who post the cases at [facility] look at it, and she said the same thing. Code is not on the list, but the system will not let us  post it if we do not put it as admit. So no, she does not need to be admit.

## 2024-01-06 ENCOUNTER — Other Ambulatory Visit: Payer: Self-pay | Admitting: Family Medicine

## 2024-01-12 NOTE — Progress Notes (Addendum)
 Surgical Instructions   Your procedure is scheduled on Tuesday, September 16th. Report to [facility] Main Entrance A at 10:15 A.M., then check in with the Admitting office. Any questions or running late day of surgery: call [phone]  Questions prior to your surgery date: call [phone], Monday-Friday, 8am-4pm. If you experience any cold or flu symptoms such as cough, fever, chills, shortness of breath, etc. between now and your scheduled surgery, please notify us  at the above number.     Remember:  Do not eat after midnight the night before your surgery  You may drink clear liquids until 9:15 the morning of your surgery.   Clear liquids allowed are: Water , Non-Citrus Juices (without pulp), Carbonated Beverages, Clear Tea (no milk, honey, etc.), Black Coffee Only (NO MILK, CREAM OR POWDERED CREAMER of any kind), and Gatorade.    Take these medicines the morning of surgery with A SIP OF WATER   atorvastatin  (LIPITOR)  busPIRone  (BUSPAR )  fenofibrate   gabapentin  (NEURONTIN )  lansoprazole (PREVACID)  levothyroxine  (SYNTHROID )  primidone  (MYSOLINE )  sertraline  (ZOLOFT )  Vibegron (GEMTESA)   Follow your cardiologist instruction and HOLD your aspirin  for 5-7 day's prior to surgery.   One week prior to surgery, STOP taking any Aspirin  (unless otherwise instructed by your surgeon) Aleve, Naproxen, Ibuprofen, Motrin, Advil, Goody's, BC's, all herbal medications, fish oil, and non-prescription vitamins. This includes your diclofenac  Sodium (VOLTAREN ) gel.    WHAT DO I DO ABOUT MY DIABETES MEDICATION?   Do not take oral diabetes medicines (pills) the morning of surgery.  THE NIGHT BEFORE SURGERY, take 15 units (50%) of  Insulin  glargine (LANTUS  SOLOSTAR) insulin  pen.    HOLD your insulin  aspart (NOVOLOG  FLEXPEN) on the day of surgery.  The day of surgery, do not take other diabetes injectables, including Byetta (exenatide), Bydureon (exenatide ER), Victoza (liraglutide), or  Trulicity (dulaglutide).  If your CBG is greater than 220 mg/dL, you may take  of your sliding scale (correction) dose of insulin .   HOW TO MANAGE YOUR DIABETES BEFORE AND AFTER SURGERY  Why is it important to control my blood sugar before and after surgery? Improving blood sugar levels before and after surgery helps healing and can limit problems. A way of improving blood sugar control is eating a healthy diet by:  Eating less sugar and carbohydrates  Increasing activity/exercise  Talking with your doctor about reaching your blood sugar goals High blood sugars (greater than 180 mg/dL) can raise your risk of infections and slow your recovery, so you will need to focus on controlling your diabetes during the weeks before surgery. Make sure that the doctor who takes care of your diabetes knows about your planned surgery including the date and location.  How do I manage my blood sugar before surgery? Check your blood sugar at least 4 times a day, starting 2 days before surgery, to make sure that the level is not too high or low.  Check your blood sugar the morning of your surgery when you wake up and every 2 hours until you get to the Short Stay unit.  If your blood sugar is less than 70 mg/dL, you will need to treat for low blood sugar: Do not take insulin . Treat a low blood sugar (less than 70 mg/dL) with  cup of clear juice (cranberry or apple), 4 glucose tablets, OR glucose gel. Recheck blood sugar in 15 minutes after treatment (to make sure it is greater than 70 mg/dL). If your blood sugar is not greater than 70 mg/dL on  recheck, call [phone] for further instructions. Report your blood sugar to the short stay nurse when you get to Short Stay.  If you are admitted to the hospital after surgery: Your blood sugar will be checked by the staff and you will probably be given insulin  after surgery (instead of oral diabetes medicines) to make sure you have good blood sugar levels. The  goal for blood sugar control after surgery is 80-180 mg/dL.                     Do NOT Smoke (Tobacco/Vaping) for 24 hours prior to your procedure.  If you use a CPAP at night, you may bring your mask/headgear for your overnight stay.   You will be asked to remove any contacts, glasses, piercing's, hearing aid's, dentures/partials prior to surgery. Please bring cases for these items if needed.    Patients discharged the day of surgery will not be allowed to drive home, and someone needs to stay with them for 24 hours.  SURGICAL WAITING ROOM VISITATION Patients may have no more than 2 support people in the waiting area - these visitors may rotate.   Pre-op nurse will coordinate an appropriate time for 1 ADULT support person, who may not rotate, to accompany patient in pre-op.  Children under the age of 74 must have an adult with them who is not the patient and must remain in the main waiting area with an adult.  If the patient needs to stay at the hospital during part of their recovery, the visitor guidelines for inpatient rooms apply.  Please refer to the [facility] website for the visitor guidelines for any additional information.   If you received a COVID test during your pre-op visit  it is requested that you wear a mask when out in public, stay away from anyone that may not be feeling well and notify your surgeon if you develop symptoms. If you have been in contact with anyone that has tested positive in the last 10 days please notify you surgeon.    Oral Hygiene is also important to reduce your risk of infection.  Remember - BRUSH YOUR TEETH THE MORNING OF SURGERY WITH YOUR REGULAR TOOTHPASTE  - Preparing for Total Shoulder Arthroplasty  Before surgery, you can play an important role. Because skin is not sterile, your skin needs to be as free of germs as possible. You can reduce the number of germs on your skin by using the following products.   Benzoyl Peroxide  Gel  o Reduces the number of germs present on the skin  o Applied twice a day to shoulder area starting two days before surgery   Chlorhexidine  Gluconate (CHG) Soap (instructions listed above on how to wash with CHG Soap)  o An antiseptic cleaner that kills germs and bonds with the skin to continue killing germs even after washing  o Used for showering the night before surgery and morning of surgery   ==================================================================  Please follow these instructions carefully:  BENZOYL PEROXIDE 5% GEL  Please do not use if you have an allergy to benzoyl peroxide. If your skin becomes reddened/irritated stop using the benzoyl peroxide.  Starting two days before surgery, apply as follows:  1. Apply benzoyl peroxide in the morning and at night. Apply after taking a shower. If you are not taking a shower clean entire shoulder front, back, and side along with the armpit with a clean wet washcloth.  2. Place a quarter-sized dollop on  your SHOULDER and rub in thoroughly, making sure to cover the front, back, and side of your shoulder, along with the armpit.   2 Days prior to Surgery First Dose on _____________ Morning Second Dose on ______________ Night  Day Before Surgery First Dose on ______________ Morning Night before surgery wash (entire body except face and private areas) with CHG Soap THEN Second Dose on ____________ Night   Morning of Surgery  wash BODY AGAIN with CHG Soap   4. Do NOT apply benzoyl peroxide gel on the day of surgery   [facility]- Preparing For Surgery  Before surgery, you can play an important role. Because skin is not sterile, your skin needs to be as free of germs as possible. You can reduce the number of germs on your skin by washing with CHG (chlorahexidine gluconate) Soap before surgery.  CHG is an antiseptic cleaner which kills germs and bonds with the skin to continue killing germs even after washing.      Please do not use if you have an allergy to CHG or antibacterial soaps. If your skin becomes reddened/irritated stop using the CHG.  Do not shave (including legs and underarms) for at least 48 hours prior to first CHG shower. It is OK to shave your face.  Please follow these instructions carefully.     Shower the NIGHT BEFORE SURGERY and the MORNING OF SURGERY with CHG Soap.   If you chose to wash your hair, wash your hair first as usual with your normal shampoo. After you shampoo, rinse your hair and body thoroughly to remove the shampoo.  Then [employer] and genitals (private parts) with your normal soap and rinse thoroughly to remove soap.  After that Use CHG Soap as you would any other liquid soap. You can apply CHG directly to the skin and wash gently with a scrungie or a clean washcloth.   Apply the CHG Soap to your body ONLY FROM THE NECK DOWN.  Do not use on open wounds or open sores. Avoid contact with your eyes, ears, mouth and genitals (private parts). Wash Face and genitals (private parts)  with your normal soap.   Wash thoroughly, paying special attention to the area where your surgery will be performed.  Thoroughly rinse your body with warm water  from the neck down.  DO NOT shower/wash with your normal soap after using and rinsing off the CHG Soap.  Pat yourself dry with a CLEAN TOWEL.  8. Apply the Benzoyl Peroxide only the night before surgery.  Do Not use it the morning of surgery.  Wear CLEAN PAJAMAS to bed the night before surgery  Place CLEAN SHEETS on your bed the night before your surgery  DO NOT SLEEP WITH PETS.   Day of Surgery: Take a shower with CHG soap. Wear Clean/Comfortable clothing the morning of surgery Do not apply any deodorants/lotions.   Remember to brush your teeth WITH YOUR REGULAR TOOTHPASTE.  .       Pre-operative 5 CHG Bathing Instructions   You can play a key role in reducing the risk of infection after surgery. Your skin needs  to be as free of germs as possible. You can reduce the number of germs on your skin by washing with CHG (chlorhexidine  gluconate) soap before surgery. CHG is an antiseptic soap that kills germs and continues to kill germs even after washing.   DO NOT use if you have an allergy to chlorhexidine /CHG or antibacterial soaps. If your skin becomes reddened or  irritated, stop using the CHG and notify one of our RNs at [phone].   Please shower with the CHG soap starting 4 days before surgery using the following schedule:     Please keep in mind the following:  DO NOT shave, including legs and underarms, starting the day of your first shower.   You may shave your face at any point before/day of surgery.  Place clean sheets on your bed the day you start using CHG soap. Use a clean washcloth (not used since being washed) for each shower. DO NOT sleep with pets once you start using the CHG.   CHG Shower Instructions:  Wash your face and private area with normal soap. If you choose to wash your hair, wash first with your normal shampoo.  After you use shampoo/soap, rinse your hair and body thoroughly to remove shampoo/soap residue.  Turn the water  OFF and apply about 3 tablespoons (45 ml) of CHG soap to a CLEAN washcloth.  Apply CHG soap ONLY FROM YOUR NECK DOWN TO YOUR TOES (washing for 3-5 minutes)  DO NOT use CHG soap on face, private areas, open wounds, or sores.  Pay special attention to the area where your surgery is being performed.  If you are having back surgery, having someone wash your back for you may be helpful. Wait 2 minutes after CHG soap is applied, then you may rinse off the CHG soap.  Pat dry with a clean towel  Put on clean clothes/pajamas   If you choose to wear lotion, please use ONLY the CHG-compatible lotions that are listed below.  Additional instructions for the day of surgery: DO NOT APPLY any lotions, deodorants, cologne, or perfumes.   Do not bring valuables to the  hospital. [facility] is not responsible for any belongings/valuables. Do not wear nail polish, gel polish, artificial nails, or any other type of covering on natural nails (fingers and toes) Do not wear jewelry or makeup Put on clean/comfortable clothes.  Please brush your teeth.  Ask your nurse before applying any prescription medications to the skin.     CHG Compatible Lotions   Aveeno Moisturizing lotion  Cetaphil Moisturizing Cream  Cetaphil Moisturizing Lotion  Clairol Herbal Essence Moisturizing Lotion, Dry Skin  Clairol Herbal Essence Moisturizing Lotion, Extra Dry Skin  Clairol Herbal Essence Moisturizing Lotion, Normal Skin  Curel Age Defying Therapeutic Moisturizing Lotion with Alpha Hydroxy  Curel Extreme Care Body Lotion  Curel Soothing Hands Moisturizing Hand Lotion  Curel Therapeutic Moisturizing Cream, Fragrance-Free  Curel Therapeutic Moisturizing Lotion, Fragrance-Free  Curel Therapeutic Moisturizing Lotion, Original Formula  Eucerin Daily Replenishing Lotion  Eucerin Dry Skin Therapy Plus Alpha Hydroxy Crme  Eucerin Dry Skin Therapy Plus Alpha Hydroxy Lotion  Eucerin Original Crme  Eucerin Original Lotion  Eucerin Plus Crme Eucerin Plus Lotion  Eucerin TriLipid Replenishing Lotion  Keri Anti-Bacterial Hand Lotion  Keri Deep Conditioning Original Lotion Dry Skin Formula Softly Scented  Keri Deep Conditioning Original Lotion, Fragrance Free Sensitive Skin Formula  Keri Lotion Fast Absorbing Fragrance Free Sensitive Skin Formula  Keri Lotion Fast Absorbing Softly Scented Dry Skin Formula  Keri Original Lotion  Keri Skin Renewal Lotion Keri Silky Smooth Lotion  Keri Silky Smooth Sensitive Skin Lotion  Nivea Body Creamy Conditioning Oil  Nivea Body Extra Enriched Teacher, adult education Moisturizing Lotion Nivea Crme  Nivea Skin Firming Lotion  NutraDerm 30 Skin Lotion  NutraDerm Skin Lotion  NutraDerm Therapeutic Skin  Cream  NutraDerm  Therapeutic Skin Lotion  ProShield Protective Hand Cream  Provon moisturizing lotion  Please read over the following fact sheets that you were given.

## 2024-01-13 ENCOUNTER — Telehealth: Payer: Self-pay | Admitting: Neurology

## 2024-01-13 ENCOUNTER — Encounter (HOSPITAL_COMMUNITY): Payer: Self-pay

## 2024-01-13 ENCOUNTER — Other Ambulatory Visit: Payer: Self-pay

## 2024-01-13 ENCOUNTER — Encounter (HOSPITAL_COMMUNITY)
Admission: RE | Admit: 2024-01-13 | Discharge: 2024-01-13 | Disposition: A | Discharge status: Home / Self Care | Source: Ambulatory Visit | Attending: Orthopaedic Surgery | Admitting: Orthopaedic Surgery

## 2024-01-13 VITALS — BP 186/65 | HR 61 | Temp 98.1°F | Resp 18 | Ht 62.0 in | Wt 215.0 lb

## 2024-01-13 DIAGNOSIS — Z01812 Encounter for preprocedural laboratory examination: Secondary | ICD-10-CM | POA: Insufficient documentation

## 2024-01-13 DIAGNOSIS — Z01818 Encounter for other preprocedural examination: Secondary | ICD-10-CM

## 2024-01-13 DIAGNOSIS — E1151 Type 2 diabetes mellitus with diabetic peripheral angiopathy without gangrene: Secondary | ICD-10-CM | POA: Diagnosis not present

## 2024-01-13 HISTORY — DX: Cerebral infarction, unspecified: I63.9

## 2024-01-13 LAB — CBC
HCT: 40.6 % (ref 36.0–46.0)
Hemoglobin: 13.3 g/dL (ref 12.0–15.0)
MCH: 31.7 pg (ref 26.0–34.0)
MCHC: 32.8 g/dL (ref 30.0–36.0)
MCV: 96.7 fL (ref 80.0–100.0)
Platelets: 156 K/uL (ref 150–400)
RBC: 4.2 MIL/uL (ref 3.87–5.11)
RDW: 13.1 % (ref 11.5–15.5)
WBC: 7.5 K/uL (ref 4.0–10.5)
nRBC: 0 % (ref 0.0–0.2)

## 2024-01-13 LAB — GLUCOSE, CAPILLARY: Glucose-Capillary: 276 mg/dL — ABNORMAL HIGH (ref 70–99)

## 2024-01-13 LAB — BASIC METABOLIC PANEL WITH GFR
Anion gap: 6 (ref 5–15)
BUN: 12 mg/dL (ref 8–23)
CO2: 27 mmol/L (ref 22–32)
Calcium: 8.9 mg/dL (ref 8.9–10.3)
Chloride: 103 mmol/L (ref 98–111)
Creatinine, Ser: 0.81 mg/dL (ref 0.44–1.00)
GFR, Estimated: >60 mL/min (ref >60)
Glucose, Bld: 215 mg/dL — ABNORMAL HIGH (ref 70–99)
Potassium: 4.7 mmol/L (ref 3.5–5.1)
Sodium: 136 mmol/L (ref 135–145)

## 2024-01-13 LAB — SURGICAL PCR SCREEN
MRSA, PCR: NEGATIVE
Staphylococcus aureus: NEGATIVE

## 2024-01-13 LAB — HEMOGLOBIN A1C
Hgb A1c MFr Bld: 6.9 % — ABNORMAL HIGH (ref 4.8–5.6)
Mean Plasma Glucose: 151.33 mg/dL

## 2024-01-13 NOTE — Telephone Encounter (Signed)
 Caller states she needs surgery clearance sent back to her office. Ortho care of [employer] [facility]

## 2024-01-13 NOTE — Telephone Encounter (Signed)
 Dr. Evonnie,  I am Dr. Danetta surgery scheduler. Anesthesia at [facility] is requiring a surgical clearance from you stating patient is optimized for her upcoming surgery on 9/16 with Dr. Genelle. Patient did advise us  she is no longer on Plavix , but because she did have a stroke approximately six months ago a clearance is needed from you to proceed with surgery. I have tried to relay this message thru a phone call, but it is not being translated. If you could, please fax the clearance to Attn. April B at [phone]. Your attention to this matter is greatly appreciated.

## 2024-01-13 NOTE — Progress Notes (Signed)
 PCP - Dr. Comer Greet Cardiologist - Dr. Candyce Reek LOV 12-14-23 Follow up as needed  PPM/ICD - Denies Device Orders - n/a Rep Notified - n/a  Chest x-ray - N/A EKG - 08-10-23 Stress Test - 11-18-21 ECHO - 04-30-10 Cardiac Cath - 12-03-21  Sleep Study - Yes CPAP - patient states she does not tolerate a cpap machine  Fasting Blood Sugar - [phone] - per patient her physician has been making changes to her diabetic medication Checks Blood Sugar: checks 2-3 times a day  Last dose of GLP1 agonist-  Denies GLP1 instructions: n/a  Blood Thinner Instructions: Denies Aspirin  Instructions: Per physician hold 5-7 days. Patient states her last day was 01-09-24  ERAS Protcol - Clears until 0915 PRE-SURGERY Ensure or G2- G2  COVID TEST- n/a   Anesthesia review: Yes, DM, CKD, CHF, CAD, sleep apnea, TIA  Patient denies shortness of breath, fever, cough and chest pain at PAT appointment. Patient denies any respiratory issues at this time.   All instructions explained to the patient, with a verbal understanding of the material. Patient agrees to go over the instructions while at home for a better understanding. Patient also instructed to self quarantine after being tested for COVID-19. The opportunity to ask questions was provided.

## 2024-01-14 ENCOUNTER — Encounter (HOSPITAL_COMMUNITY): Payer: Self-pay | Admitting: Vascular Surgery

## 2024-01-14 NOTE — Telephone Encounter (Signed)
 Can you call the patient please.  I'm being asked for surgery clearance.  Her surgery is on Tuesday.  Unfortunately, it hasn't been 6 months since her stroke.  Ideally, we would like her to be 6 months out, especially since she has had 2 infarcts and this appears to be an elective surgery.  Her infarct ws 5 months ago.

## 2024-01-14 NOTE — Telephone Encounter (Signed)
 Called patient and explained that Dr. Evonnie can not sign off on surgery until she has been stroke free for 60 months at least

## 2024-01-17 ENCOUNTER — Telehealth: Payer: Self-pay | Admitting: Orthopaedic Surgery

## 2024-01-17 ENCOUNTER — Telehealth: Payer: Self-pay | Admitting: Neurology

## 2024-01-17 NOTE — Telephone Encounter (Signed)
 Lacey [name] spoke to patient and cancelled surgery

## 2024-01-17 NOTE — Telephone Encounter (Signed)
 Pt left a message to have earlier appt due to issue with surgery clearance, placed Pt on waitlist

## 2024-01-17 NOTE — Telephone Encounter (Signed)
 Patient called. Would like to know what to do. Says she does not think her neurosurgeon approved the surgery. Would like a call.

## 2024-01-18 ENCOUNTER — Encounter (HOSPITAL_COMMUNITY): Admission: RE | Payer: Self-pay | Source: Home / Self Care

## 2024-01-18 ENCOUNTER — Encounter: Payer: Self-pay | Admitting: Neurology

## 2024-01-18 ENCOUNTER — Inpatient Hospital Stay (HOSPITAL_COMMUNITY): Admission: RE | Admit: 2024-01-18 | Source: Home / Self Care | Admitting: Orthopaedic Surgery

## 2024-01-18 SURGERY — REVISION, REVERSE TOTAL ARTHROPLASTY, SHOULDER
Anesthesia: General | Site: Shoulder | Laterality: Right

## 2024-01-27 ENCOUNTER — Ambulatory Visit (HOSPITAL_COMMUNITY): Admit: 2024-01-27 | Admitting: Orthopaedic Surgery

## 2024-01-27 SURGERY — ARTHROPLASTY, KNEE, TOTAL
Anesthesia: Spinal | Site: Knee | Laterality: Right

## 2024-01-28 ENCOUNTER — Encounter (HOSPITAL_BASED_OUTPATIENT_CLINIC_OR_DEPARTMENT_OTHER): Admitting: Orthopaedic Surgery

## 2024-02-03 ENCOUNTER — Ambulatory Visit (INDEPENDENT_AMBULATORY_CARE_PROVIDER_SITE_OTHER)

## 2024-02-03 DIAGNOSIS — Z23 Encounter for immunization: Secondary | ICD-10-CM | POA: Diagnosis not present

## 2024-02-03 NOTE — Progress Notes (Addendum)
 Patient is in office today for a nurse visit for Influenza Immunization. Patient Injection was given in the  Left deltoid. Patient tolerated injection well.

## 2024-02-08 ENCOUNTER — Ambulatory Visit: Payer: Medicare HMO | Admitting: *Deleted

## 2024-02-08 VITALS — Ht 62.0 in | Wt 215.0 lb

## 2024-02-08 DIAGNOSIS — Z0000 Encounter for general adult medical examination without abnormal findings: Secondary | ICD-10-CM | POA: Diagnosis not present

## 2024-02-08 DIAGNOSIS — Z780 Asymptomatic menopausal state: Secondary | ICD-10-CM

## 2024-02-08 NOTE — Patient Instructions (Signed)
 Lacey [name] , Thank you for taking time to come for your Medicare Wellness Visit. I appreciate your ongoing commitment to your health goals. Please review the following plan we discussed and let me know if I can assist you in the future.   Screening recommendations/referrals: Colonoscopy:  Mammogram:  Bone Density:  Recommended yearly ophthalmology/optometry visit for glaucoma screening and checkup Recommended yearly dental visit for hygiene and checkup  Vaccinations: Influenza vaccine:  Pneumococcal vaccine:  Tdap vaccine:  Shingles vaccine:         Preventive Care 65 Years and Older, Female Preventive care refers to lifestyle choices and visits with your health care provider that can promote health and wellness. What does preventive care include? A yearly physical exam. This is also called an annual well check. Dental exams once or twice a year. Routine eye exams. Ask your health care provider how often you should have your eyes checked. Personal lifestyle choices, including: Daily care of your teeth and gums. Regular physical activity. Eating a healthy diet. Avoiding tobacco and drug use. Limiting alcohol use. Practicing safe sex. Taking low-dose aspirin  every day. Taking vitamin and mineral supplements as recommended by your health care provider. What happens during an annual well check? The services and screenings done by your health care provider during your annual well check will depend on your age, overall health, lifestyle risk factors, and family history of disease. Counseling  Your health care provider may ask you questions about your: Alcohol use. Tobacco use. Drug use. Emotional well-being. Home and relationship well-being. Sexual activity. Eating habits. History of falls. Memory and ability to understand (cognition). Work and work Astronomer. Reproductive health. Screening  You may have the following tests or measurements: Height, weight, and  BMI. Blood pressure. Lipid and cholesterol levels. These may be checked every 5 years, or more frequently if you are over 11 years old. Skin check. Lung cancer screening. You may have this screening every year starting at age 85 if you have a 30-pack-year history of smoking and currently smoke or have quit within the past 15 years. Fecal occult blood test (FOBT) of the stool. You may have this test every year starting at age 55. Flexible sigmoidoscopy or colonoscopy. You may have a sigmoidoscopy every 5 years or a colonoscopy every 10 years starting at age 23. Hepatitis C blood test. Hepatitis B blood test. Sexually transmitted disease (STD) testing. Diabetes screening. This is done by checking your blood sugar (glucose) after you have not eaten for a while (fasting). You may have this done every 1-3 years. Bone density scan. This is done to screen for osteoporosis. You may have this done starting at age 32. Mammogram. This may be done every 1-2 years. Talk to your health care provider about how often you should have regular mammograms. Talk with your health care provider about your test results, treatment options, and if necessary, the need for more tests. Vaccines  Your health care provider may recommend certain vaccines, such as: Influenza vaccine. This is recommended every year. Tetanus, diphtheria, and acellular pertussis (Tdap, Td) vaccine. You may need a Td booster every 10 years. Zoster vaccine. You may need this after age 82. Pneumococcal 13-valent conjugate (PCV13) vaccine. One dose is recommended after age 49. Pneumococcal polysaccharide (PPSV23) vaccine. One dose is recommended after age 76. Talk to your health care provider about which screenings and vaccines you need and how often you need them. This information is not intended to replace advice given to you by your  health care provider. Make sure you discuss any questions you have with your health care provider. Document  Released: 05/17/2015 Document Revised: 01/08/2016 Document Reviewed: 02/19/2015 Elsevier Interactive Patient Education  2017 [employer].  Fall Prevention in the Home Falls can cause injuries. They can happen to people of all ages. There are many things you can do to make your home safe and to help prevent falls. What can I do on the outside of my home? Regularly fix the edges of walkways and driveways and fix any cracks. Remove anything that might make you trip as you walk through a door, such as a raised step or threshold. Trim any bushes or trees on the path to your home. Use bright outdoor lighting. Clear any walking paths of anything that might make someone trip, such as rocks or tools. Regularly check to see if handrails are loose or broken. Make sure that both sides of any steps have handrails. Any raised decks and porches should have guardrails on the edges. Have any leaves, snow, or ice cleared regularly. Use sand or salt on walking paths during winter. Clean up any spills in your garage right away. This includes oil or grease spills. What can I do in the bathroom? Use night lights. Install grab bars by the toilet and in the tub and shower. Do not use towel bars as grab bars. Use non-skid mats or decals in the tub or shower. If you need to sit down in the shower, use a plastic, non-slip stool. Keep the floor dry. Clean up any water  that spills on the floor as soon as it happens. Remove soap buildup in the tub or shower regularly. Attach bath mats securely with double-sided non-slip rug tape. Do not have throw rugs and other things on the floor that can make you trip. What can I do in the bedroom? Use night lights. Make sure that you have a light by your bed that is easy to reach. Do not use any sheets or blankets that are too big for your bed. They should not hang down onto the floor. Have a firm chair that has side arms. You can use this for support while you get dressed. Do  not have throw rugs and other things on the floor that can make you trip. What can I do in the kitchen? Clean up any spills right away. Avoid walking on wet floors. Keep items that you use a lot in easy-to-reach places. If you need to reach something above you, use a strong step stool that has a grab bar. Keep electrical cords out of the way. Do not use floor polish or wax that makes floors slippery. If you must use wax, use non-skid floor wax. Do not have throw rugs and other things on the floor that can make you trip. What can I do with my stairs? Do not leave any items on the stairs. Make sure that there are handrails on both sides of the stairs and use them. Fix handrails that are broken or loose. Make sure that handrails are as long as the stairways. Check any carpeting to make sure that it is firmly attached to the stairs. Fix any carpet that is loose or worn. Avoid having throw rugs at the top or bottom of the stairs. If you do have throw rugs, attach them to the floor with carpet tape. Make sure that you have a light switch at the top of the stairs and the bottom of the stairs. If you do  not have them, ask someone to add them for you. What else can I do to help prevent falls? Wear shoes that: Do not have high heels. Have rubber bottoms. Are comfortable and fit you well. Are closed at the toe. Do not wear sandals. If you use a stepladder: Make sure that it is fully opened. Do not climb a closed stepladder. Make sure that both sides of the stepladder are locked into place. Ask someone to hold it for you, if possible. Clearly mark and make sure that you can see: Any grab bars or handrails. First and last steps. Where the edge of each step is. Use tools that help you move around (mobility aids) if they are needed. These include: Canes. Walkers. Scooters. Crutches. Turn on the lights when you go into a dark area. Replace any light bulbs as soon as they burn out. Set up your  furniture so you have a clear path. Avoid moving your furniture around. If any of your floors are uneven, fix them. If there are any pets around you, be aware of where they are. Review your medicines with your doctor. Some medicines can make you feel dizzy. This can increase your chance of falling. Ask your doctor what other things that you can do to help prevent falls. This information is not intended to replace advice given to you by your health care provider. Make sure you discuss any questions you have with your health care provider. Document Released: 02/14/2009 Document Revised: 09/26/2015 Document Reviewed: 05/25/2014 Elsevier Interactive Patient Education  2017 [employer].

## 2024-02-08 NOTE — Progress Notes (Signed)
Reviewed documentation provided by LPN and agree

## 2024-02-08 NOTE — Progress Notes (Signed)
 Subjective:   Lacey [name] is a 73 y.o. female who presents for Medicare Annual (Subsequent) preventive examination.  Visit Complete: Virtual I connected with  Lacey [name] on 02/08/24 by a video and audio enabled telemedicine application and verified that I am speaking with the correct person using two identifiers.  Patient Location: Home  Provider Location: Home Office  I discussed the limitations of evaluation and management by telemedicine. The patient expressed understanding and agreed to proceed.  Vital Signs: Because this visit was a virtual/telehealth visit, some criteria may be missing or patient reported. Any vitals not documented were not able to be obtained and vitals that have been documented are patient reported.   Cardiac Risk Factors include: advanced age (>20men, >89 women);obesity (BMI >30kg/m2);family history of premature cardiovascular disease;hypertension     Objective:    Today's Vitals   02/08/24 1102  Weight: 215 lb (97.5 kg)  Height: 5' 2 (1.575 m)   Body mass index is 39.32 kg/m.     02/08/2024   11:13 AM 01/13/2024    1:37 PM 12/07/2023    9:13 AM 08/12/2023    3:01 PM 08/09/2023    2:52 PM 05/06/2023    8:08 AM 01/14/2023   10:37 AM  Advanced Directives  Does Patient Have a Medical Advance Directive? Yes No Yes Yes Yes Yes Yes  Type of Advance Directive Healthcare Power of Attorney  Living will Living will Living will Living will Healthcare Power of Attorney  Copy of Healthcare Power of Attorney in Chart? No - copy requested      No - copy requested  Would patient like information on creating a medical advance directive?  No - Patient declined         Current Medications (verified) Outpatient Encounter Medications as of 02/08/2024  Medication Sig   Alcohol Swabs (DROPSAFE ALCOHOL PREP) 70 % PADS USE AS DIRECTED  AS NEEDED.   aspirin  EC 81 MG tablet Take 1 tablet (81 mg total) by mouth daily.   atorvastatin  (LIPITOR) 40 MG tablet Take 1 tablet (40 mg  total) by mouth daily.   B-D ULTRAFINE III SHORT PEN 31G X 8 MM MISC USE 4 TIMES A DAY   Blood Glucose Monitoring Suppl (TRUE METRIX METER) w/Device KIT Use as directed 3-4 times daily.  Dx- type 2 diabetes w/ use of insulin    busPIRone  (BUSPAR ) 7.5 MG tablet TAKE 1 TABLET BY MOUTH 2 TIMES DAILY.   calcium -vitamin D  (OSCAL WITH D) 500-5 MG-MCG tablet Take 1 tablet by mouth daily with breakfast.   clonazePAM  (KLONOPIN ) 1 MG tablet TAKE 1 TABLET AT BEDTIME FOR RESTLESS LEGS SYNDROME   diclofenac  Sodium (VOLTAREN ) 1 % GEL APPLY 2 GRAMS TO AFFECTED AREA 4 TIMES A DAY (Patient taking differently: Apply 2 g topically 4 (four) times daily as needed (pain).)   fenofibrate  160 MG tablet TAKE 1 TABLET DAILY   furosemide  (LASIX ) 20 MG tablet TAKE 1 TABLET EVERY DAY (Patient taking differently: Take 20 mg by mouth daily as needed for fluid or edema.)   gabapentin  (NEURONTIN ) 300 MG capsule TAKE 3 CAPSULES BY MOUTH 3 TIMES DAILY.   glucose blood test strip 1 each by Other route as needed for other. Test 4 times daily   glucose blood test strip 1 each by Other route 4 (four) times daily - after meals and at bedtime. Use as instructed   insulin  aspart (NOVOLOG  FLEXPEN) 100 UNIT/ML FlexPen INJECT 25 UNITS SUBCUTANEOUSLY THREE TIMES DAILY WITH MEALS   insulin   glargine (LANTUS  SOLOSTAR) 100 UNIT/ML Solostar Pen INJECT 60 UNITS UNDER THE SKIN AT BEDTIME (Patient taking differently: Inject 30 Units into the skin at bedtime.)   Lancets Super Thin 28G MISC Please use as directed to test sugars 4 times daily. Dx. E11.9   lansoprazole (PREVACID) 15 MG capsule Take 15 mg by mouth daily before breakfast.   levothyroxine  (SYNTHROID ) 50 MCG tablet TAKE 1 TABLET BY MOUTH EVERY DAY BEFORE BREAKFAST   loperamide (IMODIUM A-D) 2 MG tablet Take 2 mg by mouth daily as needed for diarrhea or loose stools.   nitrofurantoin , macrocrystal-monohydrate, (MACROBID ) 100 MG capsule Take 100 mg by mouth at bedtime.   OneTouch UltraSoft 2  Lancets MISC 1 Device by Does not apply route 4 (four) times daily.   primidone  (MYSOLINE ) 50 MG tablet TAKE 2 TABLETS BY MOUTH 2 TIMES DAILY.   sertraline  (ZOLOFT ) 100 MG tablet Take 1 tablet (100 mg total) by mouth daily.   traZODone  (DESYREL ) 100 MG tablet TAKE 1 TABLET AT BEDTIME (Patient taking differently: Take 100 mg by mouth at bedtime as needed for sleep.)   valsartan  (DIOVAN ) 320 MG tablet TAKE 1 TABLET DAILY   verapamil  (CALAN -SR) 120 MG CR tablet TAKE 1 TABLET BY MOUTH EVERYDAY AT BEDTIME   Vibegron (GEMTESA) 75 MG TABS Take 75 mg by mouth daily.   aspirin  EC 325 MG tablet Take 1 tablet (325 mg total) by mouth daily. (Patient not taking: Reported on 02/08/2024)   oxyCODONE  (ROXICODONE ) 5 MG immediate release tablet Take 1 tablet (5 mg total) by mouth every 4 (four) hours as needed for severe pain (pain score 7-10) or breakthrough pain. (Patient not taking: Reported on 02/08/2024)   promethazine -dextromethorphan (PROMETHAZINE -DM) 6.25-15 MG/5ML syrup Take 5 mLs by mouth 4 (four) times daily as needed. (Patient not taking: Reported on 02/08/2024)   No facility-administered encounter medications on file as of 02/08/2024.    Allergies (verified) Patient has no known allergies.   History: Past Medical History:  Diagnosis Date   Adenomatous colon polyp    hyperplastic   Anemia    Anginal pain    not recent   Arthritis    NECK, KNEES, FINGERS, TOES   Atrial tachycardia CARDIOLOGIST - DR FERNANDE (LAST VISIT AUG 2012)   Echo 12/11: EF 55-60%, Mild LVH, grade 1 diast dysfxn;   holter 1/12: ATach   Atypical chest pain    a. 07/2004 Cath: Clean cors;  b. 04/2010 Myoview : EF 63%, no ischemia   Blood transfusion without reported diagnosis 1969   CHF (congestive heart failure) (HCC)    Chronic kidney disease    left kidney small    Coronary artery disease    Diabetes mellitus, type 2 (HCC)    ORAL AND INSULIN  MEDS (LAST A1C  7.3)   Diverticulosis    Erythema CURRENT-- CLOSED ABD. WALL  ABSCESS   PER PCP NOTE (03-31-11)- MRSA-- TAKES DOXYCYCLINE    GERD (gastroesophageal reflux disease)    CONTROLLED W/ PREVACID   History of kidney stones 2012   Hyperlipidemia    Hypertension    Insomnia    TAKES MEDS PRN   Neuropathy, peripheral    Obesity (BMI 30-39.9) 02/19/2015   Pneumonia    as child   Post op infection 11/07/2012   left bunionectomy   Restless leg syndrome    Sepsis, urinary HISTORY - 2004   Sleep apnea    no cpap    Stroke (HCC)    08-2023   Thyroid  disease  TIA (transient ischemic attack)    mini strokes 2023   Tremor    Vitamin D  deficiency    Past Surgical History:  Procedure Laterality Date   ABDOMINAL HYSTERECTOMY  1995   ovaries remain   BUNIONECTOMY Left 10/24/2012   BUNIONECTOMY Right 05/17/2017   CARDIAC CATHETERIZATION  07/10/2004   CARPAL TUNNEL RELEASE  2000   RIGHT   CATARACT EXTRACTION, BILATERAL     CERVICAL FUSION  10/12/2007   C5 - 7   COLONOSCOPY     CYSTOSCOPY WITH RETROGRADE PYELOGRAM, URETEROSCOPY AND STENT PLACEMENT Left 11/28/2012   Procedure: LEFT RETROGRADE PYELOGRAM, LEFT URETEROSCOPY, ;  Surgeon: Mark C Ottelin, MD;  Location: [facility];  Service: Urology;  Laterality: Left;   CYSTOSCOPY/RETROGRADE/URETEROSCOPY/STONE EXTRACTION WITH BASKET  X2 2004 & 2009   LEFT    GASTRIC BYPASS  1981   KNEE ARTHROSCOPY  12/2010   LEFT   LAPAROSCOPIC CHOLECYSTECTOMY  2001   LEFT HEART CATH AND CORONARY ANGIOGRAPHY N/A 12/03/2021   Procedure: LEFT HEART CATH AND CORONARY ANGIOGRAPHY;  Surgeon: Dann Candyce RAMAN, MD;  Location: [facility] INVASIVE CV LAB;  Service: Cardiovascular;  Laterality: N/A;   left thumb joint surgery  2013   PERCUTANEOUS NEPHROSTOLITHOTOMY  02/27/2011   LEFT   POLYPECTOMY     REVERSE SHOULDER ARTHROPLASTY Right 07/12/2022   Procedure: REVERSE SHOULDER ARTHROPLASTY;  Surgeon: Genelle Standing, MD;  Location: [facility] OR;  Service: Orthopedics;  Laterality: Right;   RIGHT THUMB JOINT SURG.  09/2009   TRIGGER FINGER  RELEASE  2010   RIGHT THUMB   UPPER GASTROINTESTINAL ENDOSCOPY     URETERAL STENT PLACEMENT  X2  2004  &  2009   LEFT   URETEROSCOPY  04/06/2011   Procedure: URETEROSCOPY;  Surgeon: Oneil JAYSON Rafter, MD;  Location: [facility];  Service: Urology;  Laterality: Left;  L Ureteroscopy Laser Litho & Stent    Family History  Problem Relation Age of Onset   Hyperlipidemia Mother    Hypertension Mother    Heart attack Father    Lung disease Father    Heart disease Sister    Hypertension Sister    Colon polyps Sister    Hypertension Sister    Colon polyps Sister    Hypertension Sister    Hypertension Sister    Lung cancer Sister 64       stage 4    Cancer - Other Daughter        appendiceal caner   Diabetes Other    Breast cancer Other    Heart disease Other    Colon cancer Other 28       nephew   Esophageal cancer Neg Hx    Rectal cancer Neg Hx    Stomach cancer Neg Hx    Amblyopia Neg Hx    Blindness Neg Hx    Cataracts Neg Hx    Glaucoma Neg Hx    Retinal detachment Neg Hx    Strabismus Neg Hx    Retinitis pigmentosa Neg Hx    Social History   Socioeconomic History   Marital status: Married    Spouse name: Not on file   Number of children: 2   Years of education: 12   Highest education level: 12th grade  Occupational History   Occupation: Educational psychologist middle school    Employer: [employer]    Comment: in office   Occupation: Retired  Tobacco Use   Smoking status: Every Day    Current  packs/day: 2.00    Average packs/day: 2.0 packs/day for 43.0 years (86.0 ttl pk-yrs)    Types: Cigarettes   Smokeless tobacco: Never   Tobacco comments:    1/2 ppd 12/29/21  Vaping Use   Vaping status: Never Used  Substance and Sexual Activity   Alcohol use: No    Alcohol/week: 0.0 standard drinks of alcohol   Drug use: No   Sexual activity: Not Currently  Other Topics Concern   Not on file  Social History Narrative   2 children, 2 stepchildren   Lives  with husband.   Right-handed.   No daily caffeine.   Social Drivers of Corporate investment banker Strain: Low Risk  (02/08/2024)   Overall Financial Resource Strain (CARDIA)    Difficulty of Paying Living Expenses: Not hard at all  Food Insecurity: No Food Insecurity (02/08/2024)   Hunger Vital Sign    Worried About Running Out of Food in the Last Year: Never true    Ran Out of Food in the Last Year: Never true  Transportation Needs: No Transportation Needs (02/08/2024)   PRAPARE - Administrator, Civil Service (Medical): No    Lack of Transportation (Non-Medical): No  Physical Activity: Inactive (02/08/2024)   Exercise Vital Sign    Days of Exercise per Week: 0 days    Minutes of Exercise per Session: 0 min  Stress: Stress Concern Present (02/08/2024)   [employer] of Occupational Health - Occupational Stress Questionnaire    Feeling of Stress: To some extent  Social Connections: Moderately Integrated (02/08/2024)   Social Connection and Isolation Panel    Frequency of Communication with Friends and Family: More than three times a week    Frequency of Social Gatherings with Friends and Family: More than three times a week    Attends Religious Services: More than 4 times per year    Active Member of [employer] or Organizations: No    Attends Banker Meetings: Never    Marital Status: Married    Tobacco Counseling Ready to quit: Not Answered Counseling given: Not Answered Tobacco comments: 1/2 ppd 12/29/21   Clinical Intake:  Pre-visit preparation completed: Yes  Pain : No/denies pain     Diabetes: Yes CBG done?: No Did pt. bring in CBG monitor from home?: No  How often do you need to have someone help you when you read instructions, pamphlets, or other written materials from your doctor or pharmacy?: 1 - Never  Interpreter Needed?: No  Information entered by :: Mliss Graff LPN   Activities of Daily Living    02/08/2024   11:01 AM  02/07/2024    3:00 PM  In your present state of health, do you have any difficulty performing the following activities:  Hearing? 0 0  Vision? 0 0  Difficulty concentrating or making decisions? 0 0  Walking or climbing stairs? 0 0  Dressing or bathing? 0 0  Doing errands, shopping? 0 0  Preparing Food and eating ? N N  Using the Toilet? N N  In the past six months, have you accidently leaked urine? Y Y  Do you have problems with loss of bowel control? N N  Managing your Medications? N N  Managing your Finances? N N  Housekeeping or managing your Housekeeping?  N    Patient Care Team: Mahlon Comer BRAVO, MD as PCP - General (Family Medicine) Dann Candyce RAMAN, MD as PCP - Cardiology (Cardiology) Fernande Elspeth BROCKS, MD as  PCP - Electrophysiology (Cardiology) Fernande Elspeth BROCKS, MD as Consulting Physician (Cardiology) Gaspar Kung, MD as Consulting Physician (Orthopedic Surgery) Kenn Dunnings, [place] as Consulting Physician (Optometry) Abran Norleen SAILOR, MD as Consulting Physician (Gastroenterology) Devere Lonni Righter, MD as Consulting Physician (Urology) [place], Amy, MD as Consulting Physician (Dermatology) Josefina Chew, MD as Consulting Physician (Orthopedic Surgery) Tat, Asberry RAMAN, DO as Consulting Physician (Neurology)  Indicate any recent Medical Services you may have received from other than [facility] providers in the past year (date may be approximate).     Assessment:   This is a routine wellness examination for Cecile.  Hearing/Vision screen Hearing Screening - Comments:: No trouble hearing Vision Screening - Comments:: Wood Up to date   Goals Addressed             This Visit's Progress    DIET - EAT MORE FRUITS AND VEGETABLES   On track    Patient Stated   Not on track    Would like to lose weight     Patient Stated   Not on track    Would like to loose some weight     Patient Stated       Get healthier       Depression Screen    02/08/2024   10:58 AM  12/10/2023   10:13 AM 01/28/2023   12:53 PM 01/14/2023   10:43 AM 10/01/2022   10:30 AM 08/19/2022   10:23 AM 07/22/2022   10:53 AM  PHQ 2/9 Scores  PHQ - 2 Score 3  2 0 0 0 6  PHQ- 9 Score 3  3 0 0 6 21  Exception Documentation  Patient refusal         Fall Risk    02/08/2024   10:56 AM 02/07/2024    3:00 PM 12/07/2023    9:12 AM 11/03/2023    9:56 AM 08/12/2023    3:01 PM  Fall Risk   Falls in the past year? 0 0 0 0 0  Number falls in past yr: 0   0 0  Injury with Fall? 0   0 0  Risk for fall due to :    No Fall Risks   Follow up Falls evaluation completed;Education provided;Falls prevention discussed  Falls evaluation completed  Falls evaluation completed    MEDICARE RISK AT HOME: Medicare Risk at Home Any stairs in or around the home?: No If so, are there any without handrails?: No Home free of loose throw rugs in walkways, pet beds, electrical cords, etc?: Yes Adequate lighting in your home to reduce risk of falls?: Yes Life alert?: No Use of a cane, walker or w/c?: No Grab bars in the bathroom?: Yes Shower chair or bench in shower?: Yes Elevated toilet seat or a handicapped toilet?: No  TIMED UP AND GO:  Was the test performed?  No    Cognitive Function:    06/16/2018   10:28 AM 06/09/2017    1:30 PM  MMSE - Mini Mental State Exam  Orientation to time 5 5   Orientation to Place 5 5   Registration 3 3   Attention/ Calculation 5 5   Recall 3 3   Language- name 2 objects 2 2   Language- repeat 1 1  Language- follow 3 step command 3 3   Language- read & follow direction 1 1   Write a sentence 1 1   Copy design 1 1   Total score 30 30  Data saved with a previous flowsheet row definition        02/08/2024   10:57 AM 01/14/2023   10:40 AM 01/08/2022   10:40 AM  6CIT Screen  What Year? 0 points 0 points 0 points  What month? 0 points 0 points 0 points  What time? 0 points 0 points 0 points  Count back from 20 0 points 0 points 0 points  Months in reverse 0  points 0 points 0 points  Repeat phrase 0 points 0 points 0 points  Total Score 0 points 0 points 0 points    Immunizations Immunization History  Administered Date(s) Administered   Fluad Quad(high Dose 65+) 01/06/2022   INFLUENZA, HIGH DOSE SEASONAL PF 01/10/2018, 12/23/2018, 01/25/2019, 01/04/2020, 01/08/2021, 01/06/2023, 02/03/2024   Influenza Split 02/05/2011, 02/26/2012, 01/06/2022   Influenza Whole 02/05/2009, 01/20/2010   Influenza,inj,Quad PF,6+ Mos 01/24/2013, 01/15/2014, 01/22/2015, 01/23/2016, 12/29/2016   Influenza-Unspecified 01/02/2021, 01/06/2023   PFIZER(Purple Top)SARS-COV-2 Vaccination 05/29/2019, 06/19/2019, 01/29/2020   Pfizer Covid-19 Vaccine Bivalent Booster 60yrs & up 01/08/2021   Pneumococcal Conjugate-13 04/17/2014   Pneumococcal Polysaccharide-23 06/05/2007, 02/06/2016   Respiratory Syncytial Virus Vaccine,Recomb Aduvanted(Arexvy) 04/18/2022   Tdap 06/22/2011, 12/23/2018, 01/25/2019   Zoster Recombinant(Shingrix) 07/01/2017, 09/04/2017   Zoster, Live 03/24/2012    TDAP status: Up to date  Flu Vaccine status: Up to date  Pneumococcal vaccine status: Up to date  Covid-19 vaccine status: Declined, Education has been provided regarding the importance of this vaccine but patient still declined. Advised may receive this vaccine at local pharmacy or Health Dept.or vaccine clinic. Aware to provide a copy of the vaccination record if obtained from local pharmacy or Health Dept. Verbalized acceptance and understanding.  Qualifies for Shingles Vaccine? No   Zostavax completed Yes   Shingrix Completed?: Yes  Screening Tests Health Maintenance  Topic Date Due   Mammogram  01/19/2024   FOOT EXAM  01/28/2024   Lung Cancer Screening  02/17/2024   HEMOGLOBIN A1C  07/12/2024   Diabetic kidney evaluation - Urine ACR  11/02/2024   OPHTHALMOLOGY EXAM  12/16/2024   Diabetic kidney evaluation - eGFR measurement  01/12/2025   Medicare Annual Wellness (AWV)  02/07/2025    Colonoscopy  09/03/2025   DEXA SCAN  01/14/2027   DTaP/Tdap/Td (4 - Td or Tdap) 01/24/2029   Pneumococcal Vaccine: 50+ Years  Completed   Influenza Vaccine  Completed   Hepatitis C Screening  Completed   Zoster Vaccines- Shingrix  Completed   Meningococcal B Vaccine  Aged Out   COVID-19 Vaccine  Discontinued    Health Maintenance  Health Maintenance Due  Topic Date Due   Mammogram  01/19/2024   FOOT EXAM  01/28/2024   Lung Cancer Screening  02/17/2024    Colorectal cancer screening: Type of screening: Colonoscopy. Completed 2022. Repeat every 5 years  Mammogram will schedule  Bone Density status: Ordered  . Pt provided with contact info and advised to call to schedule appt.  Lung Cancer Screening: (Low Dose CT Chest recommended if Age 66-80 years, 20 pack-year currently smoking OR have quit w/in 15years.) does qualify.   Lung Cancer Screening Referral: scheduled  Additional Screening:  Hepatitis C Screening: does not qualify; Completed  2017  Vision Screening: Recommended annual ophthalmology exams for early detection of glaucoma and other disorders of the eye. Is the patient up to date with their annual eye exam?  Yes  Who is the provider or what is the name of the office in which the patient attends annual eye  exams? [facility] If pt is not established with a provider, would they like to be referred to a provider to establish care? No .   Dental Screening: Recommended annual dental exams for proper oral hygiene  Nutrition Risk Assessment:  Has the patient had any N/V/D within the last 2 months?  No  Does the patient have any non-healing wounds?  No  Has the patient had any unintentional weight loss or weight gain?  No   Diabetes:  Is the patient diabetic?  Yes  If diabetic, was a CBG obtained today?  No  Did the patient bring in their glucometer from home?  No  How often do you monitor your CBG's? 4 x a day.   Financial Strains and Diabetes Management:  Are you  having any financial strains with the device, your supplies or your medication? No .  Does the patient want to be seen by Chronic Care Management for management of their diabetes?  No  Would the patient like to be referred to a Nutritionist or for Diabetic Management?  No   Diabetic Exams:  Diabetic Eye Exam: Completed .Pt has been advised about the importance in completing this exam.  Diabetic Foot Exam: . Pt has been advised about the importance in completing this exam..    Community Resource Referral / Chronic Care Management: CRR required this visit?  No   CCM required this visit?  No     Plan:     I have personally reviewed and noted the following in the patient's chart:   Medical and social history Use of alcohol, tobacco or illicit drugs  Current medications and supplements including opioid prescriptions. Patient is not currently taking opioid prescriptions. Functional ability and status Nutritional status Physical activity Advanced directives List of other physicians Hospitalizations, surgeries, and ER visits in previous 12 months Vitals Screenings to include cognitive, depression, and falls Referrals and appointments  In addition, I have reviewed and discussed with patient certain preventive protocols, quality metrics, and best practice recommendations. A written personalized care plan for preventive services as well as general preventive health recommendations were provided to patient.     Mliss Graff, LPN   [id number]   After Visit Summary: (MyChart) Due to this being a telephonic visit, the after visit summary with patients personalized plan was offered to patient via MyChart   Nurse Notes:

## 2024-02-10 ENCOUNTER — Encounter: Admitting: Orthopaedic Surgery

## 2024-02-14 ENCOUNTER — Ambulatory Visit (INDEPENDENT_AMBULATORY_CARE_PROVIDER_SITE_OTHER): Admitting: Family Medicine

## 2024-02-14 ENCOUNTER — Encounter: Payer: Self-pay | Admitting: Family Medicine

## 2024-02-14 VITALS — BP 148/68 | HR 58 | Temp 97.8°F | Ht 62.0 in | Wt 221.0 lb

## 2024-02-14 DIAGNOSIS — Z794 Long term (current) use of insulin: Secondary | ICD-10-CM | POA: Diagnosis not present

## 2024-02-14 DIAGNOSIS — R300 Dysuria: Secondary | ICD-10-CM | POA: Diagnosis not present

## 2024-02-14 DIAGNOSIS — E113391 Type 2 diabetes mellitus with moderate nonproliferative diabetic retinopathy without macular edema, right eye: Secondary | ICD-10-CM | POA: Diagnosis not present

## 2024-02-14 LAB — POCT URINALYSIS DIPSTICK
Bilirubin, UA: NEGATIVE
Blood, UA: POSITIVE
Glucose, UA: POSITIVE — AB
Ketones, UA: NEGATIVE
Nitrite, UA: NEGATIVE
Protein, UA: POSITIVE — AB
Spec Grav, UA: 1.015 (ref 1.010–1.025)
Urobilinogen, UA: 0.2 U/dL
pH, UA: 6 (ref 5.0–8.0)

## 2024-02-14 NOTE — Assessment & Plan Note (Signed)
 Deteriorated.  Pt reports recent CBGs have been much higher than usual.  She was unaware that A1C last month was 6.9%.  She is thrilled w/ this news.  Suspect her high readings are due to the high levels of stress she has been under w/ the death of her daughter, her husband's health issues, her own orthopedic pain and recent strokes- which have delayed/cancelled her surgeries.  Will increase Lantus  to 40 units nightly and she will continue to adjust her Novolog  prior to meals.  Will follow closely.

## 2024-02-14 NOTE — Progress Notes (Signed)
   Subjective:    Patient ID: Lacey [name], female    DOB: 02/02/1951, 73 y.o.   MRN: [id number]  HPI DM- pt reports elevated CBGs.  Currently on Novolog  25 units TID, Lantus  32-35 units nightly.  Last A1C 6.9% on 9/11.  Pt reports sugars have been 'as high as 3 something'.  Eating dinner between 5-6pm.  Will very rarely eat after dinner.  Has been trying to adjust her insulin  on her own based on readings.  Pt doesn't feel 'bad' but has noticed increased thirst.  Sxs started ~6 weeks ago.  Pt was supposed to have both shoulder and knee surgery.  She has been under considerable stress- daughter passed away, husband's health is declining, she has been dealing w/ orthopedic issues.    Dysuria- pt reports she had a UTI and was treated by her kidney specialist.  She states as soon as she came off medication (unknown) she again had discomfort when urinating   Review of Systems For ROS see HPI     Objective:   Physical Exam Vitals reviewed.  Constitutional:      General: She is not in acute distress.    Appearance: Normal appearance. She is not ill-appearing.  HENT:     Head: Normocephalic and atraumatic.  Eyes:     Extraocular Movements: Extraocular movements intact.     Conjunctiva/sclera: Conjunctivae normal.  Cardiovascular:     Rate and Rhythm: Normal rate and regular rhythm.  Pulmonary:     Effort: Pulmonary effort is normal. No respiratory distress.  Skin:    General: Skin is warm and dry.  Neurological:     General: No focal deficit present.     Mental Status: She is alert and oriented to person, place, and time.  Psychiatric:     Comments: Tearful, overwhelmed, anxious           Assessment & Plan:  Dysuria- new.  Check UA and if abnormal, send for cx.  Will await results prior to starting abx.  Pt expressed understanding and is in agreement w/ plan.

## 2024-02-14 NOTE — Patient Instructions (Signed)
 Follow up in 1 month to recheck sugars INCREASE the Lantus  to 40 units nightly Continue the Novolog  3x/day and adjust as needed Try and limit your carb and sugar intake during the day Make sure you are taking care of YOU during all of this and trying to limit stress Call with any questions or concerns Hang in there!!

## 2024-02-17 ENCOUNTER — Other Ambulatory Visit: Payer: Self-pay | Admitting: Family Medicine

## 2024-02-17 DIAGNOSIS — G2581 Restless legs syndrome: Secondary | ICD-10-CM

## 2024-02-17 NOTE — Telephone Encounter (Signed)
 Requested Prescriptions   Pending Prescriptions Disp Refills   clonazePAM  (KLONOPIN ) 1 MG tablet [Pharmacy Med Name: CLONAZEPAM  1 MG TABLET] 30 tablet     Sig: TAKE 1 TABLET AT BEDTIME FOR RESTLESS LEGS SYNDROME     Date of patient request: 02/17/2024 Last office visit: 02/14/2024 Upcoming visit: 03/23/2024 Date of last refill: 10/19/2023 Last refill amount: 30

## 2024-02-18 ENCOUNTER — Ambulatory Visit (HOSPITAL_COMMUNITY)
Admission: RE | Admit: 2024-02-18 | Discharge: 2024-02-18 | Disposition: A | Discharge status: Home / Self Care | Source: Ambulatory Visit | Attending: Acute Care | Admitting: Acute Care

## 2024-02-18 DIAGNOSIS — F17210 Nicotine dependence, cigarettes, uncomplicated: Secondary | ICD-10-CM | POA: Insufficient documentation

## 2024-02-18 DIAGNOSIS — Z87891 Personal history of nicotine dependence: Secondary | ICD-10-CM | POA: Diagnosis not present

## 2024-02-18 DIAGNOSIS — Z122 Encounter for screening for malignant neoplasm of respiratory organs: Secondary | ICD-10-CM | POA: Diagnosis not present

## 2024-02-19 LAB — URINE CULTURE
MICRO NUMBER:: 17091087
SPECIMEN QUALITY:: ADEQUATE

## 2024-02-20 ENCOUNTER — Ambulatory Visit: Payer: Self-pay | Admitting: Family Medicine

## 2024-02-20 MED ORDER — CEPHALEXIN 500 MG PO CAPS: 500.0000 mg | ORAL_CAPSULE | Freq: Two times a day (BID) | ORAL | 0 refills | Status: DC

## 2024-02-21 NOTE — Progress Notes (Signed)
 Tried to call patient and could not get through. Will send mychart message to see if patient was able to pick up and start medication

## 2024-02-22 NOTE — Progress Notes (Unsigned)
 Assessment/Plan:    Tremor Inderal  LA was effective, but she had significant bradycardia and it had to be discontinued. She did have an abnormal DaTscan , albeit slightly so when I reviewed it.  She did not look parkinsonian today or previous visits Skin biopsy was negative for alpha-synuclein.  This may mean she is at risk for a tauopathy (again, atypical state) but not idiopathic Parkinsons Disease.  However, she does not look like she has an atypical state continue  primidone , 50 mg, 2 in the AM, 2 in the evening.  She is happy with efficacy 2.  History of cerebral infarction, December 2022 and April 2025  -She is on aspirin , 81 mg daily.    -Talked about importance of blood pressure control with a goal <130/80 mm Hg.   - She is on Lipitor, 20 mg daily.    - long discussion again re: tobacco cessation, which really is one of her greatest risk for future stroke, besides for the fact that she has previously had stroke.  - Patient was wanting to have knee surgery but has decided to hold off on that and now have shoulder surgery.  She is greater than 6 months out from last infarction, and from a neurologic standpoint she is optimized.  She understands risks of going off of aspirin .  At this point, I have no objection to the surgery from a purely neurologic standpoint.  She is back to baseline from her previous infarcts.  She did have a URI today and needs to f/u with pcp.  2.   Hx of nephrolithiasis             -Drugs like topiramate and Zonegran would not be able to be used because of this for her tremor.  3. Myoclonus  -better with decrease in gabapentin  dose.  4.  Tobacco abuse  - Long discussion about the importance of tobacco cessation.  Discussed it as a risk factor for recurrence of stroke. 5.  Adjustment disorder  - Due to death of daughter from appendiceal CA  - She is also helping to caregiver for her husband. 6.  Shoulder pain  -getting ready for another shoulder surgery on  the R  7.  URI  -pt did have some type of URI today and I told her she needed to f/u with pcp.  She has diffuse inspiratory/expiratory wheezes and she promised she would call pcp for sick visit.  BP is high likely from cold meds she is taking Subjective:   Lacey [name] was worked into the office today.   Patient continues on primidone  for her tremor.  She is doing well from a tremor standpoint.  She continues to take that medication faithfully.  That primidone  is amazing.  A little over a month ago, she had planned to have a surgery and immediately prior to the surgery (5 days) we got a request for clearance.  However, this was an elective surgery and she had not been out for 6 months from her stroke, and I felt it was probably best that we wait at least 6 months.  She states that she is now going to have shoulder surgery first (over knee surgery) and is holding off on the knee surgery now.  She worries about infection from DM as the incision is smaller with the shoulder as they can do it arthroscopically.  She does state that she has a URI for the last 2 days and doesn't feel well.  She has been  taking cold meds.  Current movement disorder medications: Primidone , 50 mg, 2 po bid  Aspirin , 81 mg daily  Prior medications: Propranolol  (bradycardia)   ALLERGIES:  No Known Allergies  CURRENT MEDICATIONS:  Outpatient Encounter Medications as of 02/24/2024  Medication Sig   Alcohol Swabs (DROPSAFE ALCOHOL PREP) 70 % PADS USE AS DIRECTED  AS NEEDED.   aspirin  EC 325 MG tablet Take 1 tablet (325 mg total) by mouth daily.   aspirin  EC 81 MG tablet Take 1 tablet (81 mg total) by mouth daily.   atorvastatin  (LIPITOR) 40 MG tablet Take 1 tablet (40 mg total) by mouth daily.   B-D ULTRAFINE III SHORT PEN 31G X 8 MM MISC USE 4 TIMES A DAY   Blood Glucose Monitoring Suppl (TRUE METRIX METER) w/Device KIT Use as directed 3-4 times daily.  Dx- type 2 diabetes w/ use of insulin    busPIRone  (BUSPAR ) 7.5 MG  tablet TAKE 1 TABLET BY MOUTH 2 TIMES DAILY.   calcium -vitamin D  (OSCAL WITH D) 500-5 MG-MCG tablet Take 1 tablet by mouth daily with breakfast.   cephALEXin  (KEFLEX ) 500 MG capsule Take 1 capsule (500 mg total) by mouth 2 (two) times daily.   clonazePAM  (KLONOPIN ) 1 MG tablet TAKE 1 TABLET AT BEDTIME FOR RESTLESS LEGS SYNDROME   diclofenac  Sodium (VOLTAREN ) 1 % GEL APPLY 2 GRAMS TO AFFECTED AREA 4 TIMES A DAY   fenofibrate  160 MG tablet TAKE 1 TABLET DAILY   furosemide  (LASIX ) 20 MG tablet TAKE 1 TABLET EVERY DAY   gabapentin  (NEURONTIN ) 300 MG capsule TAKE 3 CAPSULES BY MOUTH 3 TIMES DAILY.   glucose blood test strip 1 each by Other route as needed for other. Test 4 times daily   glucose blood test strip 1 each by Other route 4 (four) times daily - after meals and at bedtime. Use as instructed   insulin  aspart (NOVOLOG  FLEXPEN) 100 UNIT/ML FlexPen INJECT 25 UNITS SUBCUTANEOUSLY THREE TIMES DAILY WITH MEALS   insulin  glargine (LANTUS  SOLOSTAR) 100 UNIT/ML Solostar Pen INJECT 60 UNITS UNDER THE SKIN AT BEDTIME   Lancets Super Thin 28G MISC Please use as directed to test sugars 4 times daily. Dx. E11.9   lansoprazole (PREVACID) 15 MG capsule Take 15 mg by mouth daily before breakfast.   levothyroxine  (SYNTHROID ) 50 MCG tablet TAKE 1 TABLET BY MOUTH EVERY DAY BEFORE BREAKFAST   loperamide (IMODIUM A-D) 2 MG tablet Take 2 mg by mouth daily as needed for diarrhea or loose stools.   nitrofurantoin , macrocrystal-monohydrate, (MACROBID ) 100 MG capsule Take 100 mg by mouth at bedtime.   OneTouch UltraSoft 2 Lancets MISC 1 Device by Does not apply route 4 (four) times daily.   oxyCODONE  (ROXICODONE ) 5 MG immediate release tablet Take 1 tablet (5 mg total) by mouth every 4 (four) hours as needed for severe pain (pain score 7-10) or breakthrough pain.   primidone  (MYSOLINE ) 50 MG tablet TAKE 2 TABLETS BY MOUTH 2 TIMES DAILY.   promethazine -dextromethorphan (PROMETHAZINE -DM) 6.25-15 MG/5ML syrup Take 5 mLs  by mouth 4 (four) times daily as needed.   sertraline  (ZOLOFT ) 100 MG tablet Take 1 tablet (100 mg total) by mouth daily.   traZODone  (DESYREL ) 100 MG tablet TAKE 1 TABLET AT BEDTIME   valsartan  (DIOVAN ) 320 MG tablet TAKE 1 TABLET DAILY   verapamil  (CALAN -SR) 120 MG CR tablet TAKE 1 TABLET BY MOUTH EVERYDAY AT BEDTIME   Vibegron (GEMTESA) 75 MG TABS Take 75 mg by mouth daily.   No facility-administered encounter medications on file  as of 02/24/2024.    Objective:   PHYSICAL EXAMINATION:    VITALS:   Vitals:   02/24/24 0909  BP: (!) 176/66  Pulse: 75  SpO2: 95%  Weight: 220 lb 3.2 oz (99.9 kg)    GEN:  The patient appears stated age and is in NAD. HEENT:  Normocephalic, atraumatic.  The mucous membranes are moist. The superficial temporal arteries are without ropiness or tenderness. CV:  RRR Lungs: There are diffuse inspiratory and expiratory wheezes bilaterally, in addition to upper airway rhonchi. Neck/HEME:  There are no carotid bruits bilaterally.   Neurological examination:   Orientation: The patient is alert and oriented x3.  Cranial nerves: There is good facial symmetry.  Extraocular muscles are intact. The visual fields are full to confrontational testing. The speech is fluent and clear. Soft palate rises symmetrically and there is no tongue deviation. Hearing is intact to conversational tone. Sensation: Sensation is intact to light touch throughout  Motor: Strength 5/5 in the bilateral upper and lower extremities.  Grip strength is good and equal bilaterally today.   Movement examination: Tone: There is normal tone in the bilateral upper extremities.  The tone in the lower extremities is normal.  Abnormal movements: no rest tremor today.  No trouble with archimedes spirals.  No postural tremor.  Min intention tremor on the R.  This is all stable.   Coordination:  There is no decremation with RAM's, with any form of RAMS, including alternating supination and pronation  of the forearm, hand opening and closing, finger taps, heel taps and toe taps. Gait and station: She ambulates well in the hall today.   I have reviewed and interpreted the following labs independently    Chemistry      Component Value Date/Time   NA 136 01/13/2024 1420   NA 142 11/27/2021 1107   K 4.7 01/13/2024 1420   CL 103 01/13/2024 1420   CO2 27 01/13/2024 1420   BUN 12 01/13/2024 1420   BUN 15 11/27/2021 1107   CREATININE 0.81 01/13/2024 1420   CREATININE 0.89 11/18/2012 1433      Component Value Date/Time   CALCIUM  8.9 01/13/2024 1420   ALKPHOS 48 08/09/2023 1522   AST 16 08/09/2023 1522   ALT 9 08/09/2023 1522   BILITOT 0.7 08/09/2023 1522       Lab Results  Component Value Date   WBC 7.5 01/13/2024   HGB 13.3 01/13/2024   HCT 40.6 01/13/2024   MCV 96.7 01/13/2024   PLT 156 01/13/2024    Lab Results  Component Value Date   TSH 2.45 08/02/2023   Lab Results  Component Value Date   CHOL 159 08/02/2023   HDL 63.00 08/02/2023   LDLCALC 79 08/02/2023   LDLDIRECT 86.0 05/02/2015   TRIG 84.0 08/02/2023   CHOLHDL 3 08/02/2023   Lab Results  Component Value Date   HGBA1C 6.9 (H) 01/13/2024   Total time spent on today's visit was 30 minutes, including both face-to-face time and nonface-to-face time.  Time included that spent on review of records (prior notes available to me/labs/imaging if pertinent), discussing treatment and goals, answering patient's questions and coordinating care.   Cc:  Tabori, Katherine E, MD

## 2024-02-23 ENCOUNTER — Other Ambulatory Visit: Payer: Self-pay | Admitting: Acute Care

## 2024-02-23 DIAGNOSIS — Z87891 Personal history of nicotine dependence: Secondary | ICD-10-CM

## 2024-02-23 DIAGNOSIS — F17210 Nicotine dependence, cigarettes, uncomplicated: Secondary | ICD-10-CM

## 2024-02-23 DIAGNOSIS — Z122 Encounter for screening for malignant neoplasm of respiratory organs: Secondary | ICD-10-CM

## 2024-02-24 ENCOUNTER — Ambulatory Visit (HOSPITAL_BASED_OUTPATIENT_CLINIC_OR_DEPARTMENT_OTHER): Admitting: Orthopaedic Surgery

## 2024-02-24 ENCOUNTER — Ambulatory Visit: Payer: Self-pay

## 2024-02-24 ENCOUNTER — Ambulatory Visit: Admitting: Neurology

## 2024-02-24 VITALS — BP 176/66 | HR 75 | Wt 220.2 lb

## 2024-02-24 DIAGNOSIS — J069 Acute upper respiratory infection, unspecified: Secondary | ICD-10-CM

## 2024-02-24 DIAGNOSIS — Z01818 Encounter for other preprocedural examination: Secondary | ICD-10-CM

## 2024-02-24 DIAGNOSIS — G250 Essential tremor: Secondary | ICD-10-CM | POA: Diagnosis not present

## 2024-02-24 NOTE — Telephone Encounter (Signed)
 FYI Only or Action Required?: FYI only for provider.  Patient was last seen in primary care on 02/14/2024 by Mahlon Comer BRAVO, MD.  Called Nurse Triage reporting Cough.  Symptoms began 2 days ago.  Symptoms are: unchanged.  Triage Disposition: See Physician Within 24 Hours  Patient/caregiver understands and will follow disposition?: Yes    Copied from CRM [id number]. Topic: Clinical - Red Word Triage >> Feb 24, 2024 10:17 AM Alfonso ORN wrote: Red Word that prompted transfer to Nurse Triage: wheezing and coughing has to be addressed by pcp prior to surgery , shortness of breath and trouble breathing since last Tuesday     Reason for Disposition  [1] Continuous (nonstop) coughing interferes with work or school AND [2] no improvement using cough treatment per Care Advice  Answer Assessment - Initial Assessment Questions 1. ONSET: When did the cough begin?      2 days ago 2. SEVERITY: How bad is the cough today?      Moderate  3. SPUTUM: Describe the color of your sputum (e.g., none, dry cough; clear, white, yellow, green)     White 4. HEMOPTYSIS: Are you coughing up any blood? If Yes, ask: How much? (e.g., flecks, streaks, tablespoons, etc.)     No 5. DIFFICULTY BREATHING: Are you having difficulty breathing? If Yes, ask: How bad is it? (e.g., mild, moderate, severe)      Mild 6. FEVER: Do you have a fever? If Yes, ask: What is your temperature, how was it measured, and when did it start?     No 7. CARDIAC HISTORY: Do you have any history of heart disease? (e.g., heart attack, congestive heart failure)      Yes 8. LUNG HISTORY: Do you have any history of lung disease?  (e.g., pulmonary embolus, asthma, emphysema)     OSA 9. PE RISK FACTORS: Do you have a history of blood clots? (or: recent major surgery, recent prolonged travel, bedridden)     No 10. OTHER SYMPTOMS: Do you have any other symptoms? (e.g., runny nose, wheezing, chest pain)        No  Protocols used: Cough - Acute Productive-A-AH

## 2024-02-25 ENCOUNTER — Ambulatory Visit (INDEPENDENT_AMBULATORY_CARE_PROVIDER_SITE_OTHER): Admitting: Family Medicine

## 2024-02-25 VITALS — BP 172/62 | HR 96 | Temp 97.9°F | Resp 16 | Ht 62.0 in | Wt 216.8 lb

## 2024-02-25 DIAGNOSIS — E119 Type 2 diabetes mellitus without complications: Secondary | ICD-10-CM

## 2024-02-25 DIAGNOSIS — R062 Wheezing: Secondary | ICD-10-CM

## 2024-02-25 DIAGNOSIS — R051 Acute cough: Secondary | ICD-10-CM

## 2024-02-25 LAB — POCT COVID BINAXNOW CARD: SARS Coronavirus 2 Ag: NEGATIVE

## 2024-02-25 LAB — POCT INFLUENZA A/B
Influenza A, POC: NEGATIVE
Influenza B, POC: NEGATIVE

## 2024-02-25 MED ORDER — GUAIFENESIN-CODEINE 100-10 MG/5ML PO SOLN: 10.0000 mL | Freq: Four times a day (QID) | ORAL | 0 refills | Status: DC | PRN

## 2024-02-25 MED ORDER — PREDNISONE 10 MG PO TABS: ORAL_TABLET | ORAL | 0 refills | Status: DC

## 2024-02-25 MED ORDER — ALBUTEROL SULFATE HFA 108 (90 BASE) MCG/ACT IN AERS: 2.0000 | INHALATION_SPRAY | Freq: Four times a day (QID) | RESPIRATORY_TRACT | 0 refills | Status: AC | PRN

## 2024-02-25 MED ORDER — DOXYCYCLINE HYCLATE 100 MG PO TABS: 100.0000 mg | ORAL_TABLET | Freq: Two times a day (BID) | ORAL | 0 refills | Status: DC

## 2024-02-25 NOTE — Patient Instructions (Signed)
 Follow up as needed or as scheduled START the Prednisone as directed- 3 pills at the same time x3 days, then 2 pills at the same time x3 days, then 1 pill daily.  Take w/ food  TAKE the Doxycycline  twice daily- take w/ food USE the Albuterol  inhaler- 2 puffs every 4 hrs as needed for cough/wheezing Take the cough medication as needed- may cause drowsiness Call with any questions or concerns Hang in there!!!

## 2024-02-25 NOTE — Progress Notes (Signed)
   Subjective:    Patient ID: [name], female    DOB: March 28, 1951, 73 y.o.   MRN: [id number]  HPI URI- sxs started a few days ago w/ wheezing, wet cough, SOB.  + HA.  Denies sinus pain/pressure.  No known fever but + chills.  + sick contacts.  Denies N/V/D.  No body aches.  + fatigue.  No ear pain.  Sore throat but mostly w/ coughing.   Review of Systems For ROS see HPI     Objective:   Physical Exam Vitals reviewed.  Constitutional:      General: She is not in acute distress.    Appearance: Normal appearance. She is not ill-appearing.  HENT:     Head: Normocephalic and atraumatic.     Right Ear: Tympanic membrane and ear canal normal.     Left Ear: Tympanic membrane and ear canal normal.     Nose: No congestion.     Comments: No TTP over frontal or maxillary sinuses    Mouth/Throat:     Mouth: Mucous membranes are moist.     Pharynx: No oropharyngeal exudate or posterior oropharyngeal erythema.  Eyes:     Extraocular Movements: Extraocular movements intact.     Conjunctiva/sclera: Conjunctivae normal.  Cardiovascular:     Rate and Rhythm: Normal rate and regular rhythm.  Pulmonary:     Breath sounds: Wheezing present. No rhonchi or rales.     Comments: + wet, hacking cough Musculoskeletal:     Cervical back: Neck supple.  Lymphadenopathy:     Cervical: No cervical adenopathy.  Skin:    General: Skin is warm and dry.  Neurological:     General: No focal deficit present.     Mental Status: She is alert and oriented to person, place, and time.  Psychiatric:        Mood and Affect: Mood normal.        Behavior: Behavior normal.        Thought Content: Thought content normal.           Assessment & Plan:  Acute cough- new.  Pt's son w/ similar sxs.  Negative for COVID and flu.  Due to DM she is at increased risk for bacterial infxn.  Start doxycycline .  Prednisone taper to improve wheezing.  Albuterol  prn wheezing.  Cough meds prn.  Reviewed supportive care and red  flags that should prompt return.  Pt expressed understanding and is in agreement w/ plan.

## 2024-02-28 ENCOUNTER — Other Ambulatory Visit: Payer: Self-pay | Admitting: Family Medicine

## 2024-03-02 ENCOUNTER — Other Ambulatory Visit: Payer: Self-pay | Admitting: Family Medicine

## 2024-03-05 ENCOUNTER — Other Ambulatory Visit: Payer: Self-pay | Admitting: Neurology

## 2024-03-05 DIAGNOSIS — R251 Tremor, unspecified: Secondary | ICD-10-CM

## 2024-03-06 ENCOUNTER — Encounter: Payer: Self-pay | Admitting: Radiology

## 2024-03-08 ENCOUNTER — Ambulatory Visit: Payer: Self-pay | Admitting: Family Medicine

## 2024-03-08 NOTE — Telephone Encounter (Signed)
 Patient requesting refill of her cough medicine- no worsening of sx, she is improving but has not resolved completely.    Answer Assessment - Initial Assessment Questions 1. DRUG NAME: What medicine do you need to have refilled?     guaiFENesin -codeine  100-10 MG/5ML syrup [[id number]] 2. REFILLS REMAINING: How many refills are remaining? Notes: The label on the medicine or pill bottle will show how many refills are remaining. If there are no refills remaining, then a renewal may be needed.     0 4. PRESCRIBER: Who prescribed it? Note: The prescribing doctor or group is responsible for refill approvals.SABRA Greet 5. PHARMACY: Have you contacted your pharmacy (drugstore)? Note: Some pharmacies will contact the doctor (or NP/PA).  [id number] [address], [place] - [address], [place]  [address] Phone: [phone]  Fax: [phone]  6. SYMPTOMS: Do you have any symptoms?     Cough- slowly improving but not resolved  Protocols used: Medication Refill and Renewal Call-A-AH

## 2024-03-13 ENCOUNTER — Other Ambulatory Visit (HOSPITAL_BASED_OUTPATIENT_CLINIC_OR_DEPARTMENT_OTHER): Payer: Self-pay | Admitting: Orthopaedic Surgery

## 2024-03-13 DIAGNOSIS — Z96611 Presence of right artificial shoulder joint: Secondary | ICD-10-CM

## 2024-03-23 ENCOUNTER — Ambulatory Visit (INDEPENDENT_AMBULATORY_CARE_PROVIDER_SITE_OTHER): Admitting: Family Medicine

## 2024-03-23 ENCOUNTER — Encounter: Payer: Self-pay | Admitting: Family Medicine

## 2024-03-23 VITALS — BP 134/72 | HR 54 | Temp 97.9°F | Ht 62.0 in | Wt 216.5 lb

## 2024-03-23 DIAGNOSIS — E785 Hyperlipidemia, unspecified: Secondary | ICD-10-CM

## 2024-03-23 DIAGNOSIS — I10 Essential (primary) hypertension: Secondary | ICD-10-CM | POA: Diagnosis not present

## 2024-03-23 DIAGNOSIS — E113391 Type 2 diabetes mellitus with moderate nonproliferative diabetic retinopathy without macular edema, right eye: Secondary | ICD-10-CM | POA: Diagnosis not present

## 2024-03-23 DIAGNOSIS — W19XXXA Unspecified fall, initial encounter: Secondary | ICD-10-CM | POA: Diagnosis not present

## 2024-03-23 DIAGNOSIS — Z794 Long term (current) use of insulin: Secondary | ICD-10-CM | POA: Diagnosis not present

## 2024-03-23 DIAGNOSIS — E1169 Type 2 diabetes mellitus with other specified complication: Secondary | ICD-10-CM | POA: Diagnosis not present

## 2024-03-23 LAB — CBC WITH DIFFERENTIAL/PLATELET
Basophils Absolute: 0.1 K/uL (ref 0.0–0.1)
Basophils Relative: 0.9 % (ref 0.0–3.0)
Eosinophils Absolute: 0.2 K/uL (ref 0.0–0.7)
Eosinophils Relative: 3.1 % (ref 0.0–5.0)
HCT: 38.2 % (ref 36.0–46.0)
Hemoglobin: 12.8 g/dL (ref 12.0–15.0)
Lymphocytes Relative: 23.6 % (ref 12.0–46.0)
Lymphs Abs: 1.4 K/uL (ref 0.7–4.0)
MCHC: 33.4 g/dL (ref 30.0–36.0)
MCV: 94.2 fl (ref 78.0–100.0)
Monocytes Absolute: 0.4 K/uL (ref 0.1–1.0)
Monocytes Relative: 6.8 % (ref 3.0–12.0)
Neutro Abs: 3.8 K/uL (ref 1.4–7.7)
Neutrophils Relative %: 65.6 % (ref 43.0–77.0)
Platelets: 163 K/uL (ref 150.0–400.0)
RBC: 4.05 Mil/uL (ref 3.87–5.11)
RDW: 13.8 % (ref 11.5–15.5)
WBC: 5.8 K/uL (ref 4.0–10.5)

## 2024-03-23 LAB — HEPATIC FUNCTION PANEL
ALT: 7 U/L (ref 0–35)
AST: 13 U/L (ref 0–37)
Albumin: 3.7 g/dL (ref 3.5–5.2)
Alkaline Phosphatase: 47 U/L (ref 39–117)
Bilirubin, Direct: 0.1 mg/dL (ref 0.0–0.3)
Total Bilirubin: 0.3 mg/dL (ref 0.2–1.2)
Total Protein: 6.4 g/dL (ref 6.0–8.3)

## 2024-03-23 LAB — BASIC METABOLIC PANEL WITH GFR
BUN: 13 mg/dL (ref 6–23)
CO2: 28 meq/L (ref 19–32)
Calcium: 8.6 mg/dL (ref 8.4–10.5)
Chloride: 103 meq/L (ref 96–112)
Creatinine, Ser: 0.85 mg/dL (ref 0.40–1.20)
GFR: 67.75 mL/min (ref >60.00)
Glucose, Bld: 296 mg/dL — ABNORMAL HIGH (ref 70–99)
Potassium: 4.1 meq/L (ref 3.5–5.1)
Sodium: 137 meq/L (ref 135–145)

## 2024-03-23 LAB — LIPID PANEL
Cholesterol: 128 mg/dL (ref 0–200)
HDL: 48.8 mg/dL (ref >39.00)
LDL Cholesterol: 59 mg/dL (ref 0–99)
NonHDL: 78.92
Total CHOL/HDL Ratio: 3
Triglycerides: 101 mg/dL (ref 0.0–149.0)
VLDL: 20.2 mg/dL (ref 0.0–40.0)

## 2024-03-23 LAB — HEMOGLOBIN A1C: Hgb A1c MFr Bld: 7.5 % — ABNORMAL HIGH (ref 4.6–6.5)

## 2024-03-23 LAB — TSH: TSH: 2.51 u[IU]/mL (ref 0.35–5.50)

## 2024-03-23 NOTE — Progress Notes (Signed)
   Subjective:    Patient ID: Lacey [name], female    DOB: 1950-12-17, 73 y.o.   MRN: [id number]  HPI DM- chronic problem, on Lantus  40 units nightly, Novolog  TID based on sliding scale.  UTD on eye exam.  Due for foot exam.  UTD on microalbumin.  Denies symptomatic lows.  HTN- chronic problem, on Lasix  20mg  daily, Valsartan  320mg  daily, Verapamil  120mg  daily w/ good control.  No CP, SOB, HA's, visual changes, edema.  Hyperlipidemia- chronic problem, on Lipitor 40mg  daily, Fenofibrate  160mg  daily.  Denies abd pain, N/V.  Fall- pt was walking around the house and tripped over items on the floor. Scraped her R cheek but denies other injuries.   Review of Systems For ROS see HPI     Objective:   Physical Exam Vitals reviewed.  Constitutional:      General: She is not in acute distress.    Appearance: Normal appearance. She is well-developed. She is not ill-appearing.  HENT:     Head: Normocephalic and atraumatic.  Eyes:     Conjunctiva/sclera: Conjunctivae normal.     Pupils: Pupils are equal, round, and reactive to light.  Neck:     Thyroid : No thyromegaly.  Cardiovascular:     Rate and Rhythm: Normal rate and regular rhythm.     Pulses: Normal pulses.     Heart sounds: Normal heart sounds. No murmur heard. Pulmonary:     Effort: Pulmonary effort is normal. No respiratory distress.     Breath sounds: Normal breath sounds.  Abdominal:     General: There is no distension.     Palpations: Abdomen is soft.     Tenderness: There is no abdominal tenderness.  Musculoskeletal:     Cervical back: Normal range of motion and neck supple.  Lymphadenopathy:     Cervical: No cervical adenopathy.  Skin:    General: Skin is warm and dry.     Comments: Abrasion on R cheekbone  Neurological:     Mental Status: She is alert and oriented to person, place, and time. Mental status is at baseline.  Psychiatric:        Mood and Affect: Mood normal.        Behavior: Behavior normal.         Thought Content: Thought content normal.           Assessment & Plan:  Fall from standing- new.  Pt is redoing their living room and had things out of place and on the floor.  Tripped while walking.  Has abrasion on R cheek but thankfully no bruising or swelling or signs of infxn.  Encouraged her to remove all possible tripping hazards.  Will follow.

## 2024-03-23 NOTE — Assessment & Plan Note (Signed)
 BMI 39.6 and coupled w/ DM, HTN, hyperlipidemia qualifies as morbid obesity. Encouraged healthy diet and physical activity as able.

## 2024-03-23 NOTE — Patient Instructions (Addendum)
 Follow up in 3-4 months to recheck sugar We'll notify you of your lab results and make any changes if needed Continue to work on healthy diet and regular physical activity as you are able Try and eliminate any tripping hazards at home Call with any questions or concerns Stay Safe!  Stay Healthy! Happy Holidays!

## 2024-03-24 ENCOUNTER — Ambulatory Visit (HOSPITAL_BASED_OUTPATIENT_CLINIC_OR_DEPARTMENT_OTHER): Payer: Self-pay | Admitting: Orthopaedic Surgery

## 2024-03-24 ENCOUNTER — Ambulatory Visit: Payer: Self-pay | Admitting: Family Medicine

## 2024-03-24 DIAGNOSIS — Z96611 Presence of right artificial shoulder joint: Secondary | ICD-10-CM

## 2024-03-24 NOTE — Progress Notes (Addendum)
 Surgical Instructions   Your procedure is scheduled on Tuesday, 04/04/24. Report to [facility] Main Entrance A at 1:00 P.M., then check in with the Admitting office. Any questions or running late day of surgery: call [phone]  Questions prior to your surgery date: call [phone], Monday-Friday, 8am-4pm. If you experience any cold or flu symptoms such as cough, fever, chills, shortness of breath, etc. between now and your scheduled surgery, please notify us  at the above number.     Remember:  Do not eat after midnight the night before your surgery-Monday  You may drink clear liquids until 12 Noon the day of your surgery-Tuesday.   Clear liquids allowed are: Water , Non-Citrus Juices (without pulp), Carbonated Beverages, Clear Tea (no milk, honey, etc.), Black Coffee Only (NO MILK, CREAM OR POWDERED CREAMER of any kind), and Gatorade.    Take these medicines the morning of surgery with A SIP OF WATER : atorvastatin  (LIPITOR)   gabapentin  (NEURONTIN )   lansoprazole (PREVACID)  levothyroxine  (SYNTHROID )  primidone  (MYSOLINE ) sertraline  (ZOLOFT )  Vibegron (GEMTESA)    May take these medicines IF NEEDED: albuterol  (VENTOLIN  HFA)    busPIRone  (BUSPAR )  fenofibrate   guaiFENesin -codeine   loperamide (IMODIUM A-D)    (INSULIN  (LONG ACTING) MEDICATION INSTRUCTIONS (Lantus )  The night day before your procedure:  Take  your regular daily dose (Take 20-30 Units)     INSULIN  (SHORT ACTING) MEDICATION INSTRUCTIONS (Novolog )  The day of your procedure:  Do not take your morning dose We will check your blood sugar levels during the admission process and again in Recovery before discharging you home   One week prior to surgery, STOP taking any Aspirin  (unless otherwise instructed by your surgeon) Voltaren , Aleve, Naproxen, Ibuprofen, Motrin, Advil, Goody's, BC's, all herbal medications, fish oil, and non-prescription vitamins.           Aspirin  Instructions: Follow your  surgeon's instructions on when to stop aspirin  prior to surgery,  If no instructions were given by your surgeon then you will need to call the office for those instructions.             Do NOT Smoke (Tobacco/Vaping) for 24 hours prior to your procedure.  If you use a CPAP at night, you may bring your mask/headgear for your overnight stay.   You will be asked to remove any contacts, glasses, piercing's, hearing aid's, dentures/partials prior to surgery. Please bring cases for these items if needed.    Patients discharged the day of surgery will not be allowed to drive home, and someone needs to stay with them for 24 hours.  SURGICAL WAITING ROOM VISITATION Patients may have no more than 2 support people in the waiting area - these visitors may rotate.   Pre-op nurse will coordinate an appropriate time for 1 ADULT support person, who may not rotate, to accompany patient in pre-op.  Children under the age of 25 must have an adult with them who is not the patient and must remain in the main waiting area with an adult.  If the patient needs to stay at the hospital during part of their recovery, the visitor guidelines for inpatient rooms apply.  Please refer to the [facility] website for the visitor guidelines for any additional information.   If you received a COVID test during your pre-op visit  it is requested that you wear a mask when out in public, stay away from anyone that may not be feeling well and notify your surgeon if you develop symptoms. If you have  been in contact with anyone that has tested positive in the last 10 days please notify you surgeon.    Oral Hygiene is also important to reduce your risk of infection.  Remember - BRUSH YOUR TEETH THE MORNING OF SURGERY WITH YOUR REGULAR TOOTHPASTE   [place]- Preparing for Total Shoulder Arthroplasty  Before surgery, you can play an important role. Because skin is not sterile, your skin needs to be as free of germs as possible.  You can reduce the number of germs on your skin by using the following products.   Benzoyl Peroxide Gel  o Reduces the number of germs present on the skin  o Applied twice a day to shoulder area starting two days before surgery   Chlorhexidine  Gluconate (CHG) Soap (instructions listed above on how to wash with CHG Soap)  o An antiseptic cleaner that kills germs and bonds with the skin to continue killing germs even after washing  o Used for showering the night before surgery and morning of surgery   ==================================================================  Please follow these instructions carefully:  BENZOYL PEROXIDE 5% GEL  Please do not use if you have an allergy to benzoyl peroxide. If your skin becomes reddened/irritated stop using the benzoyl peroxide.  Starting two days before surgery, apply as follows:  1. Apply benzoyl peroxide in the morning and at night. Apply after taking a shower. If you are not taking a shower clean entire shoulder front, back, and side along with the armpit with a clean wet washcloth.  2. Place a quarter-sized dollop on your SHOULDER and rub in thoroughly, making sure to cover the front, back, and side of your shoulder, along with the armpit.   2 Days prior to Surgery First Dose on 04/02/24  Sunday Morning Second Dose on 06/03/23 Sunday Night  Day Before Surgery First Dose on 04/03/24 Monday Morning Night before surgery wash (entire body except face and private areas) with CHG Soap THEN Second Dose on 06/03/23 Sunday Night    4. Do NOT apply benzoyl peroxide gel on the day of surgery      Pre-operative 4 CHG Bathing Instructions   You can play a key role in reducing the risk of infection after surgery. Your skin needs to be as free of germs as possible. You can reduce the number of germs on your skin by washing with CHG (chlorhexidine  gluconate) soap before surgery. CHG is an antiseptic soap that kills germs and continues to kill  germs even after washing.   DO NOT use if you have an allergy to chlorhexidine /CHG or antibacterial soaps. If your skin becomes reddened or irritated, stop using the CHG and notify one of our RNs at [phone].   Please shower with the CHG soap starting 4 days before surgery using the following schedule:     Please keep in mind the following:  DO NOT shave, including legs and underarms, starting the day of your first shower.   You may shave your face at any point before/day of surgery.  Place clean sheets on your bed the day you start using CHG soap. Use a clean washcloth (not used since being washed) for each shower. DO NOT sleep with pets once you start using the CHG.   CHG Shower Instructions:  Wash your face and private area with normal soap. If you choose to wash your hair, wash first with your normal shampoo.  After you use shampoo/soap, rinse your hair and body thoroughly to remove shampoo/soap residue.  Turn the  water  OFF and apply  bottle of CHG soap to a CLEAN washcloth.  Apply CHG soap ONLY FROM YOUR NECK DOWN TO YOUR TOES (washing for 3-5 minutes)  DO NOT use CHG soap on face, private areas, open wounds, or sores.  Pay special attention to the area where your surgery is being performed.  If you are having back surgery, having someone wash your back for you may be helpful. Wait 2 minutes after CHG soap is applied, then you may rinse off the CHG soap.  Pat dry with a clean towel  Put on clean clothes/pajamas   If you choose to wear lotion, please use ONLY the CHG-compatible lotions that are listed below.  Additional instructions for the day of surgery:  If you choose, you may shower the morning of surgery with an antibacterial soap.  DO NOT APPLY any lotions, deodorants, cologne, or perfumes.   Do not bring valuables to the hospital. [facility] is not responsible for any belongings/valuables. Do not wear nail polish, gel polish, artificial nails, or any other type of  covering on natural nails (fingers and toes) Do not wear jewelry or makeup Put on clean/comfortable clothes.  Please brush your teeth.  Ask your nurse before applying any prescription medications to the skin.     CHG Compatible Lotions   Aveeno Moisturizing lotion  Cetaphil Moisturizing Cream  Cetaphil Moisturizing Lotion  Clairol Herbal Essence Moisturizing Lotion, Dry Skin  Clairol Herbal Essence Moisturizing Lotion, Extra Dry Skin  Clairol Herbal Essence Moisturizing Lotion, Normal Skin  Curel Age Defying Therapeutic Moisturizing Lotion with Alpha Hydroxy  Curel Extreme Care Body Lotion  Curel Soothing Hands Moisturizing Hand Lotion  Curel Therapeutic Moisturizing Cream, Fragrance-Free  Curel Therapeutic Moisturizing Lotion, Fragrance-Free  Curel Therapeutic Moisturizing Lotion, Original Formula  Eucerin Daily Replenishing Lotion  Eucerin Dry Skin Therapy Plus Alpha Hydroxy Crme  Eucerin Dry Skin Therapy Plus Alpha Hydroxy Lotion  Eucerin Original Crme  Eucerin Original Lotion  Eucerin Plus Crme Eucerin Plus Lotion  Eucerin TriLipid Replenishing Lotion  Keri Anti-Bacterial Hand Lotion  Keri Deep Conditioning Original Lotion Dry Skin Formula Softly Scented  Keri Deep Conditioning Original Lotion, Fragrance Free Sensitive Skin Formula  Keri Lotion Fast Absorbing Fragrance Free Sensitive Skin Formula  Keri Lotion Fast Absorbing Softly Scented Dry Skin Formula  Keri Original Lotion  Keri Skin Renewal Lotion Keri Silky Smooth Lotion  Keri Silky Smooth Sensitive Skin Lotion  Nivea Body Creamy Conditioning Oil  Nivea Body Extra Enriched Lotion  Nivea Body Original Lotion  Nivea Body Sheer Moisturizing Lotion Nivea Crme  Nivea Skin Firming Lotion  NutraDerm 30 Skin Lotion  NutraDerm Skin Lotion  NutraDerm Therapeutic Skin Cream  NutraDerm Therapeutic Skin Lotion  ProShield Protective Hand Cream  Provon moisturizing lotion  Please read over the following fact sheets  that you were given.

## 2024-03-26 NOTE — Assessment & Plan Note (Signed)
 Chronic problem.  Currently on Lipitor and Fenofibrate w/o difficulty.  Check labs.  Adjust meds prn

## 2024-03-26 NOTE — Assessment & Plan Note (Signed)
 Chronic problem.  Currently on Lantus  40 units nightly and Novolog  TID.  Denies symptomatic lows.  Reports suars have been better since we increased the Lantus  dose.  Check A1C and adjust meds prn.

## 2024-03-26 NOTE — Assessment & Plan Note (Signed)
 Chrnic problem.  On Valsartan , Verapamil , and Lasix  w/ good control.  Currently asymptomatic.  Check labs due to ARB and diuretic use but no anticipated med changes.

## 2024-03-27 ENCOUNTER — Encounter (HOSPITAL_COMMUNITY)
Admission: RE | Admit: 2024-03-27 | Discharge: 2024-03-27 | Disposition: A | Discharge status: Home / Self Care | Source: Ambulatory Visit | Attending: Orthopaedic Surgery | Admitting: Orthopaedic Surgery

## 2024-03-27 ENCOUNTER — Encounter (HOSPITAL_COMMUNITY): Payer: Self-pay

## 2024-03-27 ENCOUNTER — Other Ambulatory Visit: Payer: Self-pay

## 2024-03-27 VITALS — BP 182/55 | HR 60 | Temp 98.3°F | Resp 17 | Ht 62.0 in | Wt 216.6 lb

## 2024-03-27 DIAGNOSIS — Z7902 Long term (current) use of antithrombotics/antiplatelets: Secondary | ICD-10-CM | POA: Insufficient documentation

## 2024-03-27 DIAGNOSIS — Z7982 Long term (current) use of aspirin: Secondary | ICD-10-CM | POA: Insufficient documentation

## 2024-03-27 DIAGNOSIS — I5030 Unspecified diastolic (congestive) heart failure: Secondary | ICD-10-CM | POA: Insufficient documentation

## 2024-03-27 DIAGNOSIS — Z01812 Encounter for preprocedural laboratory examination: Secondary | ICD-10-CM | POA: Diagnosis present

## 2024-03-27 DIAGNOSIS — Z794 Long term (current) use of insulin: Secondary | ICD-10-CM | POA: Insufficient documentation

## 2024-03-27 DIAGNOSIS — Z8673 Personal history of transient ischemic attack (TIA), and cerebral infarction without residual deficits: Secondary | ICD-10-CM | POA: Insufficient documentation

## 2024-03-27 DIAGNOSIS — I110 Hypertensive heart disease with heart failure: Secondary | ICD-10-CM | POA: Insufficient documentation

## 2024-03-27 DIAGNOSIS — E119 Type 2 diabetes mellitus without complications: Secondary | ICD-10-CM | POA: Diagnosis not present

## 2024-03-27 DIAGNOSIS — E785 Hyperlipidemia, unspecified: Secondary | ICD-10-CM | POA: Diagnosis not present

## 2024-03-27 DIAGNOSIS — I2510 Atherosclerotic heart disease of native coronary artery without angina pectoris: Secondary | ICD-10-CM | POA: Insufficient documentation

## 2024-03-27 DIAGNOSIS — Z01818 Encounter for other preprocedural examination: Secondary | ICD-10-CM

## 2024-03-27 LAB — SURGICAL PCR SCREEN
MRSA, PCR: NEGATIVE
Staphylococcus aureus: NEGATIVE

## 2024-03-27 LAB — TYPE AND SCREEN
ABO/RH(D): O POS
Antibody Screen: NEGATIVE

## 2024-03-27 LAB — GLUCOSE, CAPILLARY: Glucose-Capillary: 268 mg/dL — ABNORMAL HIGH (ref 70–99)

## 2024-03-27 MED ORDER — CHLORHEXIDINE GLUCONATE 0.12 % MT SOLN: 15.0000 mL | Freq: Once | OROMUCOSAL | Status: DC

## 2024-03-27 MED ORDER — LACTATED RINGERS IV SOLN: INTRAVENOUS | Status: DC

## 2024-03-27 MED ORDER — ORAL CARE MOUTH RINSE: 15.0000 mL | Freq: Once | OROMUCOSAL | Status: DC

## 2024-03-27 NOTE — Progress Notes (Signed)
 Surgical Instructions   Your procedure is scheduled on Tuesday, 04/04/24. Report to [facility] [address] Main Entrance A at 1:00 P.M., then check in with the Admitting office. Any questions or running late day of surgery: call [phone]  Questions prior to your surgery date: call [phone], Monday-Friday, 8am-4pm. If you experience any cold or flu symptoms such as cough, fever, chills, shortness of breath, etc. between now and your scheduled surgery, please notify us  at the above number.     Remember:  Do not eat after midnight the night before your surgery.  You may drink clear liquids until 12 Noon the day of your surgery. Clear liquids allowed are: Water , Non-Citrus Juices (without pulp), Carbonated Beverages, Clear Tea (no milk, honey, etc.), Black Coffee Only (NO MILK, CREAM OR POWDERED CREAMER of any kind), and Gatorade.  Patient Instructions  The night before surgery:  No food after midnight. ONLY clear liquids after midnight  The day of surgery (if you have diabetes): Drink ONE (1) 12 oz G2 given to you in your pre admission testing appointment by 1:00PM the day of surgery. Drink in one sitting. Do not sip.  This drink was given to you during your hospital  pre-op appointment visit.  Nothing else to drink after completing the  12 oz bottle of G2.         If you have questions, please contact your surgeon's office.     Take these medicines the morning of surgery with A SIP OF WATER : atorvastatin  (LIPITOR)   busPIRone  (BUSPAR )  gabapentin  (NEURONTIN )   lansoprazole (PREVACID)  levothyroxine  (SYNTHROID )  primidone  (MYSOLINE ) sertraline  (ZOLOFT )  Vibegron (GEMTESA)    May take these medicines IF NEEDED: albuterol  (VENTOLIN  HFA)   - please bring inhaler to the hospital with you.  guaiFENesin -codeine   loperamide (IMODIUM A-D)    (INSULIN  (LONG ACTING) MEDICATION INSTRUCTIONS (Lantus )  The night day before your procedure:  Take  your regular daily dose (Take  20-30 Units)     INSULIN  (SHORT ACTING) MEDICATION INSTRUCTIONS (Novolog )  The day of your procedure: DO NOT take a bedtime dose of Novolog  the night before surgery. DO NOT take your morning dose of Novolog  the day of surgery.   We will check your blood sugar levels during the admission process and again in Recovery before discharging you home  FOLLOW your surgeons instructions on stopping ASPIRIN . If no instructions were given, please contact your surgeon's office.   One week prior to surgery, STOP taking any Voltaren , Aleve, Naproxen, Ibuprofen, Motrin, Advil, Goody's, BC's, all herbal medications, fish oil, and non-prescription vitamins, including diclofenac  Sodium (VOLTAREN ) 1 % GEL              HOW TO MANAGE YOUR DIABETES BEFORE AND AFTER SURGERY  Why is it important to control my blood sugar before and after surgery? Improving blood sugar levels before and after surgery helps healing and can limit problems. A way of improving blood sugar control is eating a healthy diet by:  Eating less sugar and carbohydrates  Increasing activity/exercise  Talking with your doctor about reaching your blood sugar goals High blood sugars (greater than 180 mg/dL) can raise your risk of infections and slow your recovery, so you will need to focus on controlling your diabetes during the weeks before surgery. Make sure that the doctor who takes care of your diabetes knows about your planned surgery including the date and location.  How do I manage my blood sugar before surgery? Check your blood sugar at least  4 times a day, starting 2 days before surgery, to make sure that the level is not too high or low.  Check your blood sugar the morning of your surgery when you wake up and every 2 hours until you get to the Short Stay unit.  If your blood sugar is less than 70 mg/dL, you will need to treat for low blood sugar: Do not take insulin . Treat a low blood sugar (less than 70 mg/dL) with  cup of  clear juice (cranberry or apple), 4 glucose tablets, OR glucose gel. Recheck blood sugar in 15 minutes after treatment (to make sure it is greater than 70 mg/dL). If your blood sugar is not greater than 70 mg/dL on recheck, call [phone] for further instructions. Report your blood sugar to the short stay nurse when you get to Short Stay.  If you are admitted to the hospital after surgery: Your blood sugar will be checked by the staff and you will probably be given insulin  after surgery (instead of oral diabetes medicines) to make sure you have good blood sugar levels. The goal for blood sugar control after surgery is 80-180 mg/dL.             Do NOT Smoke (Tobacco/Vaping) for 24 hours prior to your procedure.  If you use a CPAP at night, you may bring your mask/headgear for your overnight stay.   You will be asked to remove any contacts, glasses, piercing's, hearing aid's, dentures/partials prior to surgery. Please bring cases for these items if needed.    Patients discharged the day of surgery will not be allowed to drive home, and someone needs to stay with them for 24 hours.  SURGICAL WAITING ROOM VISITATION Patients may have no more than 2 support people in the waiting area - these visitors may rotate.   Pre-op nurse will coordinate an appropriate time for 1 ADULT support person, who may not rotate, to accompany patient in pre-op.  Children under the age of 4 must have an adult with them who is not the patient and must remain in the main waiting area with an adult.  If the patient needs to stay at the hospital during part of their recovery, the visitor guidelines for inpatient rooms apply.  Please refer to the [facility] website for the visitor guidelines for any additional information.   If you received a COVID test during your pre-op visit  it is requested that you wear a mask when out in public, stay away from anyone that may not be feeling well and notify your surgeon if you  develop symptoms. If you have been in contact with anyone that has tested positive in the last 10 days please notify you surgeon.    Oral Hygiene is also important to reduce your risk of infection.  Remember - BRUSH YOUR TEETH THE MORNING OF SURGERY WITH YOUR REGULAR TOOTHPASTE   [place]- Preparing for Total Shoulder Arthroplasty  Before surgery, you can play an important role. Because skin is not sterile, your skin needs to be as free of germs as possible. You can reduce the number of germs on your skin by using the following products.   Benzoyl Peroxide Gel  o Reduces the number of germs present on the skin  o Applied twice a day to shoulder area starting two days before surgery   Chlorhexidine  Gluconate (CHG) Soap (instructions listed above on how to wash with CHG Soap)  o An antiseptic cleaner that kills germs and bonds  with the skin to continue killing germs even after washing  o Used for showering the night before surgery and morning of surgery   ==================================================================  Please follow these instructions carefully:  BENZOYL PEROXIDE 5% GEL  Please do not use if you have an allergy to benzoyl peroxide. If your skin becomes reddened/irritated stop using the benzoyl peroxide.  Starting two days before surgery, apply as follows:  1. Apply benzoyl peroxide in the morning and at night. Apply after taking a shower. If you are not taking a shower clean entire shoulder front, back, and side along with the armpit with a clean wet washcloth.  2. Place a quarter-sized dollop on your SHOULDER and rub in thoroughly, making sure to cover the front, back, and side of your shoulder, along with the armpit.   2 Days prior to Surgery First Dose on 04/02/24  Sunday Morning Night- Wash (entire body except face and private areas) with CHG Soap THEN Second Dose on 04/02/24 Sunday Night  Day Before Surgery First Dose on 04/03/24 Monday  Morning Night before surgery wash (entire body except face and private areas) with CHG Soap THEN Second Dose on 04/03/24 Sunday Night    4. Do NOT apply benzoyl peroxide gel on the day of surgery      Pre-operative 4 CHG Bathing Instructions   You can play a key role in reducing the risk of infection after surgery. Your skin needs to be as free of germs as possible. You can reduce the number of germs on your skin by washing with CHG (chlorhexidine  gluconate) soap before surgery. CHG is an antiseptic soap that kills germs and continues to kill germs even after washing.   DO NOT use if you have an allergy to chlorhexidine /CHG or antibacterial soaps. If your skin becomes reddened or irritated, stop using the CHG and notify one of our RNs at [phone].   Please shower with the CHG soap starting 4 days before surgery using the following schedule:     Please keep in mind the following:  DO NOT shave, including legs and underarms, starting the day of your first shower.   You may shave your face at any point before/day of surgery.  Place clean sheets on your bed the day you start using CHG soap. Use a clean washcloth (not used since being washed) for each shower. DO NOT sleep with pets once you start using the CHG.   CHG Shower Instructions:  Wash your face and private area with normal soap. If you choose to wash your hair, wash first with your normal shampoo.  After you use shampoo/soap, rinse your hair and body thoroughly to remove shampoo/soap residue.  Turn the water  OFF and apply  bottle of CHG soap to a CLEAN washcloth.  Apply CHG soap ONLY FROM YOUR NECK DOWN TO YOUR TOES (washing for 3-5 minutes)  DO NOT use CHG soap on face, private areas, open wounds, or sores.  Pay special attention to the area where your surgery is being performed.  If you are having back surgery, having someone wash your back for you may be helpful. Wait 2 minutes after CHG soap is applied, then you may  rinse off the CHG soap.  Pat dry with a clean towel  Put on clean clothes/pajamas   If you choose to wear lotion, please use ONLY the CHG-compatible lotions that are listed below.  Additional instructions for the day of surgery:  If you choose, you may shower the morning of surgery  with an antibacterial soap.  DO NOT APPLY any lotions, deodorants, cologne, or perfumes.   Do not bring valuables to the hospital. [facility] is not responsible for any belongings/valuables. Do not wear nail polish, gel polish, artificial nails, or any other type of covering on natural nails (fingers and toes) Do not wear jewelry or makeup Put on clean/comfortable clothes.  Please brush your teeth.  Ask your nurse before applying any prescription medications to the skin.     CHG Compatible Lotions   Aveeno Moisturizing lotion  Cetaphil Moisturizing Cream  Cetaphil Moisturizing Lotion  Clairol Herbal Essence Moisturizing Lotion, Dry Skin  Clairol Herbal Essence Moisturizing Lotion, Extra Dry Skin  Clairol Herbal Essence Moisturizing Lotion, Normal Skin  Curel Age Defying Therapeutic Moisturizing Lotion with Alpha Hydroxy  Curel Extreme Care Body Lotion  Curel Soothing Hands Moisturizing Hand Lotion  Curel Therapeutic Moisturizing Cream, Fragrance-Free  Curel Therapeutic Moisturizing Lotion, Fragrance-Free  Curel Therapeutic Moisturizing Lotion, Original Formula  Eucerin Daily Replenishing Lotion  Eucerin Dry Skin Therapy Plus Alpha Hydroxy Crme  Eucerin Dry Skin Therapy Plus Alpha Hydroxy Lotion  Eucerin Original Crme  Eucerin Original Lotion  Eucerin Plus Crme Eucerin Plus Lotion  Eucerin TriLipid Replenishing Lotion  Keri Anti-Bacterial Hand Lotion  Keri Deep Conditioning Original Lotion Dry Skin Formula Softly Scented  Keri Deep Conditioning Original Lotion, Fragrance Free Sensitive Skin Formula  Keri Lotion Fast Absorbing Fragrance Free Sensitive Skin Formula  Keri Lotion Fast Absorbing  Softly Scented Dry Skin Formula  Keri Original Lotion  Keri Skin Renewal Lotion Keri Silky Smooth Lotion  Keri Silky Smooth Sensitive Skin Lotion  Nivea Body Creamy Conditioning Oil  Nivea Body Extra Enriched Lotion  Nivea Body Original Lotion  Nivea Body Sheer Moisturizing Lotion Nivea Crme  Nivea Skin Firming Lotion  NutraDerm 30 Skin Lotion  NutraDerm Skin Lotion  NutraDerm Therapeutic Skin Cream  NutraDerm Therapeutic Skin Lotion  ProShield Protective Hand Cream  Provon moisturizing lotion  Please read over the following fact sheets that you were given.

## 2024-03-27 NOTE — Progress Notes (Signed)
 PCP -    Mahlon Comer BRAVO, MD   Cardiologist - Dr Darryle P'Neal Neurologist- Dr. Asberry Tat  PPM/ICD - denies    Chest x-ray - N/A EKG - 08/09/23 Stress Test - 11/18/21 ECHO - 09/13/23 Cardiac Cath - 12/03/21  Sleep Study - no CPAP   Fasting Blood Sugar - pt reports blood sugar is normally 70-90 fasting. Reports that her blood sugar was 59 this morning, she did not take any insulin  and ate some type of cookie at 9:45. CBG 268 at PAT appointment Checks Blood Sugar 4 times a day  Last dose of GLP1 agonist-  NA   Blood Thinner Instructions: NA Aspirin  Instructions: Pt states that her last dose of ASA will be 03/29/24.   ERAS Protcol - ERAS + G2   COVID TEST- N/A   Anesthesia review: yes- cardiac history. History of stroke x2. Pt has clearance from Neurology and cardiology. Pt reports URI with coughing/wheezing in October, pt reports that she has been symptom free for 3 weeks and completed antibiotics and steroids. BP 182/55 on arrival to PAT, 167/61 after appointment. Pt asymptomatic and reports that she did take her BP medications today. Pt with home stress today as husband is currently sick and she is trying to make medical decisions about husband while at PAT appointment.    Patient denies shortness of breath, fever, cough and chest pain at PAT appointment   All instructions explained to the patient, with a verbal understanding of the material. Patient agrees to go over the instructions while at home for a better understanding.  The opportunity to ask questions was provided.

## 2024-03-28 NOTE — Progress Notes (Signed)
 Anesthesia Chart Review:  73 year old female follows with cardiology for history of nonobstructive CAD by cath 12/2021, SVT, HTN, HLD, HFpEF.  Echo 08/2023 showed LVEF 55 to 60%, normal wall motion, mild LVH, normal diastolic parameters, normal RV, no significant valvular abnormalities.  Carotid Dopplers 5/25 with minimal plaque.  Event monitor 09/2023 with average heart rate of 57 bpm, ranging from 39 bpm to 76 bpm, predominant underlying rhythm was sinus, 23 episodes of SVT, no evidence of atrial fibrillation.  Seen by Katlyn West, NP on 12/14/2023 for preop evaluation.  Per note, Ms. Hurd perioperative risk of a major cardiac event is 11% according to the Revised Cardiac Risk Index (RCRI). Her functional capacity is fair at 4.95 METs according to the Duke Activity Status Index (DASI). Recommendations: According to ACC/AHA guidelines, no further cardiovascular testing needed.  The patient may proceed to surgery at acceptable risk.  Antiplatelet and/or Anticoagulation Recommendations: From a cardiac standpoint patient can hold aspirin  for 5 to 7 days prior to procedure. Howeveer patient is currently on aspirin  and Plavix  given CVA in April 2025.  She is on aspirin  and Plavix  per neurology, final recommendations regarding holding of aspirin  and Plavix  will need to come from prescribing office.  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  She was last seen in follow-up by Dr. Evonnie on 02/24/2024 for discussion of upcoming surgery.  Per note, Patient was wanting to have knee surgery but has decided to hold off on that and now have shoulder surgery.  She is greater than 6 months out from last infarction, and from a neurologic standpoint she is optimized.  She understands risks of going off of aspirin .  At this point, I have no objection to the surgery from a purely neurologic standpoint.  She is back to baseline from her previous infarcts.  She did have a URI  today and needs to f/u with pcp.  Patient did subsequently follow-up with PCP Dr. Mahlon on 02/25/2024 regarding URI and was prescribed antibiotics and steroids.  She states she has been symptom-free for approximately 3 weeks now.  Other pertinent history includes IDDM 2, hypothyroidism, GERD on PPI, class II obesity BMI 39.6.  Preop labs reviewed, glucose elevated 296, otherwise WNL.  A1c 7.5.  EKG 08/09/2023: Sinus bradycardia with sinus arrhythmia.  Rate 57.  LDCT chest 02/18/24: IMPRESSION: 1. Lung-RADS 2, benign appearance or behavior. Continue annual screening with low-dose chest CT without contrast in 12 months. 2. Coronary artery calcifications 3. Aortic Atherosclerosis (ICD10-I70.0) and Emphysema (ICD10-J43.9).  TTE 09/13/2023: 1. Left ventricular ejection fraction, by estimation, is 55 to 60%. The  left ventricle has normal function. The left ventricle has no regional  wall motion abnormalities. There is mild left ventricular hypertrophy.  Left ventricular diastolic parameters  were normal.   2. Right ventricular systolic function is normal. The right ventricular  size is normal.   3. Left atrial size was mildly dilated.   4. The mitral valve is normal in structure. Mild mitral valve  regurgitation. No evidence of mitral stenosis.   5. The aortic valve is normal in structure. Aortic valve regurgitation is  not visualized. No aortic stenosis is present.   6. The inferior vena cava is normal in size with greater than 50%  respiratory variability, suggesting right atrial pressure of 3 mmHg.   7. Agitated saline contrast bubble study was negative, with no evidence  of any interatrial shunt.   Cath 12/03/21:  Mid Cx lesion is 25% stenosed.   The left ventricular systolic function is normal.   LV end diastolic pressure is normal.   The left ventricular ejection fraction is 55-65% by visual estimate.   There is no aortic valve stenosis.   Mild, nonobstructive coronary artery  disease.  Continue preventive therapy.    Lynwood Geofm RIGGERS [facility]/Anesthesiology Phone [phone] 03/28/2024 12:26 PM

## 2024-03-28 NOTE — Telephone Encounter (Signed)
 Patient called back and was informed of lab results.

## 2024-03-28 NOTE — Anesthesia Preprocedure Evaluation (Addendum)
 Anesthesia Evaluation  Patient identified by MRN, date of birth, ID band Patient awake    Reviewed: Allergy & Precautions, H&P , NPO status , Patient's Chart, lab work & pertinent test results  Airway Mallampati: III  TM Distance: >3 FB Neck ROM: Full    Dental  (+) Dental Advisory Given, Missing Missing many rear teeth:   Pulmonary shortness of breath, sleep apnea , pneumonia, Current Smoker and Patient abstained from smoking.   + rhonchi  + decreased breath sounds+ wheezing      Cardiovascular hypertension, Pt. on home beta blockers and Pt. on medications + angina  + CAD, + Peripheral Vascular Disease and +CHF  + dysrhythmias Supra Ventricular Tachycardia  Rhythm:Regular Rate:Normal  Stress 11/2021   Exercise capacity was moderately impaired. Patient exercised for 5 min and 0 sec. Maximum HR of 127 bpm. MPHR 85.0 %. Peak METS 6.2 . Elevated blood pressure and normal heart rate response noted during stress. Heart rate recovery was normal.   2.0 mm of horizontal ST depression in the inferior and inferolateral leads (II, III, aVF, V5 and V6) was noted. ST depression began during stress and ended during recovery. ST deviation persisted during recovery. ECG was interpretable and conclusive. The ECG was positive for ischemia.   Prior study not available for comparison.   Positive for ischemia, with 2 mm ST depressions (horizontal to downsloping) in inferior and inferolateral leads that persisted throughout recovery.   LHC 12/2021   Mid Cx lesion is 25% stenosed.   The left ventricular systolic function is normal.   LV end diastolic pressure is normal.   The left ventricular ejection fraction is 55-65% by visual estimate.   There is no aortic valve stenosis.   Mild, nonobstructive coronary artery disease.  Continue preventive therapy.     Neuro/Psych   Anxiety Depression    TIA Neuromuscular disease  negative psych ROS    GI/Hepatic Neg liver ROS,GERD  Medicated and Controlled,,  Endo/Other  diabetes, Type 2, Oral Hypoglycemic AgentsHypothyroidism  Class 3 obesity  Renal/GU Renal disease     Musculoskeletal  (+) Arthritis ,    Abdominal  (+) + obese  Peds  Hematology  (+) Blood dyscrasia, anemia   Anesthesia Other Findings   Reproductive/Obstetrics negative OB ROS                              Anesthesia Physical Anesthesia Plan  ASA: 3  Anesthesia Plan: General   Post-op Pain Management: Regional block*, Gabapentin  PO (pre-op)* and Tylenol  PO (pre-op)*   Induction: Intravenous  PONV Risk Score and Plan: 2 and Ondansetron , Dexamethasone  and Treatment may vary due to age or medical condition  Airway Management Planned: Oral ETT  Additional Equipment:   Intra-op Plan:   Post-operative Plan: Extubation in OR  Informed Consent: I have reviewed the patients History and Physical, chart, labs and discussed the procedure including the risks, benefits and alternatives for the proposed anesthesia with the patient or authorized representative who has indicated his/her understanding and acceptance.     Dental advisory given  Plan Discussed with: CRNA  Anesthesia Plan Comments: (PAT note by Lynwood Hope, PA-C:  73 year old female follows with cardiology for history of nonobstructive CAD by cath 12/2021, SVT, HTN, HLD, HFpEF.  Echo 08/2023 showed LVEF 55 to 60%, normal wall motion, mild LVH, normal diastolic parameters, normal RV, no significant valvular abnormalities.  Carotid Dopplers 5/25 with minimal plaque.  Event monitor  09/2023 with average heart rate of 57 bpm, ranging from 39 bpm to 76 bpm, predominant underlying rhythm was sinus, 23 episodes of SVT, no evidence of atrial fibrillation.  Seen by Katlyn West, NP on 12/14/2023 for preop evaluation.  Per note, Ms. Deloney perioperative risk of a major cardiac event is 11% according to the Revised Cardiac Risk Index  (RCRI). Her functional capacity is fair at 4.95 METs according to the Duke Activity Status Index (DASI). Recommendations: According to ACC/AHA guidelines, no further cardiovascular testing needed.  The patient may proceed to surgery at acceptable risk.  Antiplatelet and/or Anticoagulation Recommendations: From a cardiac standpoint patient can hold aspirin  for 5 to 7 days prior to procedure. Howeveer patient is currently on aspirin  and Plavix  given CVA in April 2025.  She is on aspirin  and Plavix  per neurology, final recommendations regarding holding of aspirin  and Plavix  will need to come from prescribing office.  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  She was last seen in follow-up by Dr. Evonnie on 02/24/2024 for discussion of upcoming surgery.  Per note, Patient was wanting to have knee surgery but has decided to hold off on that and now have shoulder surgery.  She is greater than 6 months out from last infarction, and from a neurologic standpoint she is optimized.  She understands risks of going off of aspirin .  At this point, I have no objection to the surgery from a purely neurologic standpoint.  She is back to baseline from her previous infarcts.  She did have a URI today and needs to f/u with pcp.  Patient did subsequently follow-up with PCP Dr. Mahlon on 02/25/2024 regarding URI and was prescribed antibiotics and steroids.  She states she has been symptom-free for approximately 3 weeks now.  Other pertinent history includes IDDM 2, hypothyroidism, GERD on PPI, class II obesity BMI 39.6.  Preop labs reviewed, glucose elevated 296, otherwise WNL.  A1c 7.5.  EKG 08/09/2023: Sinus bradycardia with sinus arrhythmia.  Rate 57.  LDCT chest 02/18/24: IMPRESSION: 1. Lung-RADS 2, benign appearance or behavior. Continue annual screening with low-dose chest CT without contrast in 12 months. 2. Coronary artery calcifications 3. Aortic  Atherosclerosis (ICD10-I70.0) and Emphysema (ICD10-J43.9).  TTE 09/13/2023: 1. Left ventricular ejection fraction, by estimation, is 55 to 60%. The  left ventricle has normal function. The left ventricle has no regional  wall motion abnormalities. There is mild left ventricular hypertrophy.  Left ventricular diastolic parameters  were normal.  2. Right ventricular systolic function is normal. The right ventricular  size is normal.  3. Left atrial size was mildly dilated.  4. The mitral valve is normal in structure. Mild mitral valve  regurgitation. No evidence of mitral stenosis.  5. The aortic valve is normal in structure. Aortic valve regurgitation is  not visualized. No aortic stenosis is present.  6. The inferior vena cava is normal in size with greater than 50%  respiratory variability, suggesting right atrial pressure of 3 mmHg.  7. Agitated saline contrast bubble study was negative, with no evidence  of any interatrial shunt.   Cath 12/03/21:   Mid Cx lesion is 25% stenosed.   The left ventricular systolic function is normal.   LV end diastolic pressure is normal.   The left ventricular ejection fraction is 55-65% by visual estimate.   There is no aortic valve stenosis.  Mild, nonobstructive coronary artery disease.  Continue preventive therapy.   )  Anesthesia Quick Evaluation

## 2024-03-28 NOTE — Progress Notes (Signed)
 Unable to leave vm.

## 2024-04-04 ENCOUNTER — Encounter (HOSPITAL_COMMUNITY): Payer: Self-pay | Admitting: Orthopaedic Surgery

## 2024-04-04 ENCOUNTER — Other Ambulatory Visit: Payer: Self-pay

## 2024-04-04 ENCOUNTER — Encounter (HOSPITAL_COMMUNITY): Admission: RE | Disposition: A | Payer: Self-pay | Source: Home / Self Care | Attending: Orthopaedic Surgery

## 2024-04-04 ENCOUNTER — Observation Stay (HOSPITAL_COMMUNITY)
Admission: RE | Admit: 2024-04-04 | Discharge: 2024-04-05 | DRG: 940 | Disposition: A | Attending: Orthopaedic Surgery | Admitting: Orthopaedic Surgery

## 2024-04-04 ENCOUNTER — Inpatient Hospital Stay (HOSPITAL_COMMUNITY): Admitting: Certified Registered Nurse Anesthetist

## 2024-04-04 ENCOUNTER — Inpatient Hospital Stay (HOSPITAL_COMMUNITY): Payer: Self-pay | Admitting: Physician Assistant

## 2024-04-04 DIAGNOSIS — F17210 Nicotine dependence, cigarettes, uncomplicated: Secondary | ICD-10-CM

## 2024-04-04 DIAGNOSIS — I110 Hypertensive heart disease with heart failure: Secondary | ICD-10-CM

## 2024-04-04 DIAGNOSIS — T84038A Mechanical loosening of other internal prosthetic joint, initial encounter: Secondary | ICD-10-CM | POA: Diagnosis not present

## 2024-04-04 DIAGNOSIS — T849XXA Unspecified complication of internal orthopedic prosthetic device, implant and graft, initial encounter: Secondary | ICD-10-CM | POA: Diagnosis not present

## 2024-04-04 DIAGNOSIS — I2510 Atherosclerotic heart disease of native coronary artery without angina pectoris: Secondary | ICD-10-CM

## 2024-04-04 DIAGNOSIS — I509 Heart failure, unspecified: Secondary | ICD-10-CM | POA: Diagnosis not present

## 2024-04-04 DIAGNOSIS — Z96611 Presence of right artificial shoulder joint: Principal | ICD-10-CM

## 2024-04-04 DIAGNOSIS — E119 Type 2 diabetes mellitus without complications: Secondary | ICD-10-CM

## 2024-04-04 DIAGNOSIS — G8918 Other acute postprocedural pain: Secondary | ICD-10-CM | POA: Diagnosis not present

## 2024-04-04 HISTORY — PX: REVISION TOTAL SHOULDER TO REVERSE TOTAL SHOULDER: SHX6313

## 2024-04-04 LAB — CBC
HCT: 36.6 % (ref 36.0–46.0)
Hemoglobin: 12.1 g/dL (ref 12.0–15.0)
MCH: 31.3 pg (ref 26.0–34.0)
MCHC: 33.1 g/dL (ref 30.0–36.0)
MCV: 94.8 fL (ref 80.0–100.0)
Platelets: 161 K/uL (ref 150–400)
RBC: 3.86 MIL/uL — ABNORMAL LOW (ref 3.87–5.11)
RDW: 13.5 % (ref 11.5–15.5)
WBC: 13.5 K/uL — ABNORMAL HIGH (ref 4.0–10.5)
nRBC: 0 % (ref 0.0–0.2)

## 2024-04-04 LAB — CREATININE, SERUM
Creatinine, Ser: 0.8 mg/dL (ref 0.44–1.00)
GFR, Estimated: >60 mL/min (ref >60)

## 2024-04-04 LAB — GLUCOSE, CAPILLARY
Glucose-Capillary: 148 mg/dL — ABNORMAL HIGH (ref 70–99)
Glucose-Capillary: 160 mg/dL — ABNORMAL HIGH (ref 70–99)
Glucose-Capillary: 186 mg/dL — ABNORMAL HIGH (ref 70–99)
Glucose-Capillary: 220 mg/dL — ABNORMAL HIGH (ref 70–99)

## 2024-04-04 SURGERY — REVISION, REVERSE TOTAL ARTHROPLASTY, SHOULDER
Anesthesia: General | Site: Shoulder | Laterality: Right

## 2024-04-04 MED ORDER — IRRISEPT - 450ML BOTTLE WITH 0.05% CHG IN STERILE WATER, USP 99.95% OPTIME
TOPICAL | Status: DC | PRN
  Administered 2024-04-04: 450 mL via TOPICAL

## 2024-04-04 MED ORDER — TRANEXAMIC ACID-NACL 1000-0.7 MG/100ML-% IV SOLN
1000.0000 mg | INTRAVENOUS | Status: AC
  Administered 2024-04-04: 1000 mg via INTRAVENOUS
  Filled 2024-04-04: qty 100

## 2024-04-04 MED ORDER — PROMETHAZINE-DM 6.25-15 MG/5ML PO SYRP: 5.0000 mL | ORAL_SOLUTION | Freq: Four times a day (QID) | ORAL | Status: DC | PRN

## 2024-04-04 MED ORDER — GABAPENTIN 300 MG PO CAPS
300.0000 mg | ORAL_CAPSULE | Freq: Once | ORAL | Status: DC
  Filled 2024-04-04: qty 1

## 2024-04-04 MED ORDER — ACETAMINOPHEN 500 MG PO TABS
1000.0000 mg | ORAL_TABLET | Freq: Four times a day (QID) | ORAL | Status: DC
  Administered 2024-04-04 – 2024-04-05 (×3): 1000 mg via ORAL
  Filled 2024-04-04 (×3): qty 2

## 2024-04-04 MED ORDER — ENOXAPARIN SODIUM 40 MG/0.4ML IJ SOSY
40.0000 mg | PREFILLED_SYRINGE | INTRAMUSCULAR | Status: DC
  Administered 2024-04-05: 40 mg via SUBCUTANEOUS
  Filled 2024-04-04: qty 0.4

## 2024-04-04 MED ORDER — PROPOFOL 10 MG/ML IV BOLUS
INTRAVENOUS | Status: DC | PRN
  Administered 2024-04-04: 120 mg via INTRAVENOUS

## 2024-04-04 MED ORDER — OXYCODONE HCL 5 MG PO TABS
10.0000 mg | ORAL_TABLET | ORAL | Status: DC | PRN
  Administered 2024-04-04 – 2024-04-05 (×2): 15 mg via ORAL
  Filled 2024-04-04 (×2): qty 3

## 2024-04-04 MED ORDER — ONDANSETRON HCL 4 MG/2ML IJ SOLN
INTRAMUSCULAR | Status: AC
  Filled 2024-04-04: qty 2

## 2024-04-04 MED ORDER — SUGAMMADEX SODIUM 200 MG/2ML IV SOLN
INTRAVENOUS | Status: DC | PRN
  Administered 2024-04-04: 200 mg via INTRAVENOUS

## 2024-04-04 MED ORDER — ACETAMINOPHEN 500 MG PO TABS: 1000.0000 mg | ORAL_TABLET | Freq: Once | ORAL | Status: AC

## 2024-04-04 MED ORDER — HYDRALAZINE HCL 20 MG/ML IJ SOLN
INTRAMUSCULAR | Status: DC | PRN
  Administered 2024-04-04: 5 mg via INTRAVENOUS

## 2024-04-04 MED ORDER — OYSTER SHELL CALCIUM/D3 500-5 MG-MCG PO TABS
1.0000 | ORAL_TABLET | Freq: Every day | ORAL | Status: DC
  Administered 2024-04-05: 1 via ORAL
  Filled 2024-04-04: qty 1

## 2024-04-04 MED ORDER — ACETAMINOPHEN 325 MG PO TABS: 325.0000 mg | ORAL_TABLET | Freq: Four times a day (QID) | ORAL | Status: DC | PRN

## 2024-04-04 MED ORDER — LACTATED RINGERS IV SOLN: INTRAVENOUS | Status: DC

## 2024-04-04 MED ORDER — PANTOPRAZOLE SODIUM 40 MG PO TBEC
40.0000 mg | DELAYED_RELEASE_TABLET | Freq: Every day | ORAL | Status: DC
  Administered 2024-04-05: 40 mg via ORAL
  Filled 2024-04-04: qty 1

## 2024-04-04 MED ORDER — BUPIVACAINE HCL (PF) 0.5 % IJ SOLN
INTRAMUSCULAR | Status: DC | PRN
  Administered 2024-04-04: 20 mL via PERINEURAL

## 2024-04-04 MED ORDER — ROCURONIUM BROMIDE 10 MG/ML (PF) SYRINGE
PREFILLED_SYRINGE | INTRAVENOUS | Status: DC | PRN
  Administered 2024-04-04: 60 mg via INTRAVENOUS
  Administered 2024-04-04: 30 mg via INTRAVENOUS

## 2024-04-04 MED ORDER — SODIUM CHLORIDE 0.9 % IV SOLN: INTRAVENOUS | Status: DC

## 2024-04-04 MED ORDER — PRIMIDONE 50 MG PO TABS
100.0000 mg | ORAL_TABLET | Freq: Two times a day (BID) | ORAL | Status: DC
  Administered 2024-04-04 – 2024-04-05 (×2): 100 mg via ORAL
  Filled 2024-04-04 (×3): qty 2

## 2024-04-04 MED ORDER — CEFAZOLIN SODIUM-DEXTROSE 2-4 GM/100ML-% IV SOLN
2.0000 g | INTRAVENOUS | Status: AC
  Administered 2024-04-04: 2 g via INTRAVENOUS
  Filled 2024-04-04: qty 100

## 2024-04-04 MED ORDER — MIDAZOLAM HCL 2 MG/2ML IJ SOLN
INTRAMUSCULAR | Status: AC
  Filled 2024-04-04: qty 2

## 2024-04-04 MED ORDER — PHENYLEPHRINE 80 MCG/ML (10ML) SYRINGE FOR IV PUSH (FOR BLOOD PRESSURE SUPPORT): PREFILLED_SYRINGE | INTRAVENOUS | Status: DC | PRN

## 2024-04-04 MED ORDER — INSULIN ASPART 100 UNIT/ML IJ SOLN: 0.0000 [IU] | INTRAMUSCULAR | Status: DC | PRN

## 2024-04-04 MED ORDER — GLYCOPYRROLATE 0.2 MG/ML IJ SOLN
INTRAMUSCULAR | Status: DC | PRN
  Administered 2024-04-04: .2 mg via INTRAVENOUS

## 2024-04-04 MED ORDER — TRAZODONE HCL 100 MG PO TABS
100.0000 mg | ORAL_TABLET | Freq: Every day | ORAL | Status: DC
  Administered 2024-04-04: 100 mg via ORAL
  Filled 2024-04-04 (×3): qty 1

## 2024-04-04 MED ORDER — OXYCODONE HCL 5 MG PO TABS: 5.0000 mg | ORAL_TABLET | ORAL | Status: DC | PRN

## 2024-04-04 MED ORDER — BUPIVACAINE LIPOSOME 1.3 % IJ SUSP
INTRAMUSCULAR | Status: DC | PRN
  Administered 2024-04-04: 10 mL via PERINEURAL

## 2024-04-04 MED ORDER — GUAIFENESIN-CODEINE 100-10 MG/5ML PO SOLN: 10.0000 mL | Freq: Four times a day (QID) | ORAL | Status: DC | PRN

## 2024-04-04 MED ORDER — ONETOUCH ULTRASOFT 2 LANCETS MISC: 1.0000 | Freq: Four times a day (QID) | Status: DC

## 2024-04-04 MED ORDER — AMISULPRIDE (ANTIEMETIC) 5 MG/2ML IV SOLN: 10.0000 mg | Freq: Once | INTRAVENOUS | Status: DC | PRN

## 2024-04-04 MED ORDER — LIDOCAINE 2% (20 MG/ML) 5 ML SYRINGE
INTRAMUSCULAR | Status: DC | PRN
  Administered 2024-04-04: 40 mg via INTRAVENOUS

## 2024-04-04 MED ORDER — ASPIRIN 325 MG PO TBEC
325.0000 mg | DELAYED_RELEASE_TABLET | Freq: Every day | ORAL | Status: DC
  Administered 2024-04-05: 325 mg via ORAL
  Filled 2024-04-04: qty 1

## 2024-04-04 MED ORDER — ATORVASTATIN CALCIUM 40 MG PO TABS
40.0000 mg | ORAL_TABLET | Freq: Every day | ORAL | Status: DC
  Administered 2024-04-05: 40 mg via ORAL
  Filled 2024-04-04: qty 1

## 2024-04-04 MED ORDER — SERTRALINE HCL 100 MG PO TABS
100.0000 mg | ORAL_TABLET | Freq: Every day | ORAL | Status: DC
  Administered 2024-04-05: 100 mg via ORAL
  Filled 2024-04-04: qty 1

## 2024-04-04 MED ORDER — FENOFIBRATE 160 MG PO TABS
160.0000 mg | ORAL_TABLET | Freq: Every day | ORAL | Status: DC
  Administered 2024-04-05: 160 mg via ORAL
  Filled 2024-04-04: qty 1

## 2024-04-04 MED ORDER — CHLORHEXIDINE GLUCONATE 0.12 % MT SOLN: 15.0000 mL | Freq: Once | OROMUCOSAL | Status: AC

## 2024-04-04 MED ORDER — CLONAZEPAM 1 MG PO TABS
1.0000 mg | ORAL_TABLET | Freq: Every day | ORAL | Status: DC
  Administered 2024-04-04: 1 mg via ORAL
  Filled 2024-04-04: qty 1

## 2024-04-04 MED ORDER — ALBUTEROL SULFATE (2.5 MG/3ML) 0.083% IN NEBU: 3.0000 mL | INHALATION_SOLUTION | Freq: Four times a day (QID) | RESPIRATORY_TRACT | Status: DC | PRN

## 2024-04-04 MED ORDER — HYDROMORPHONE HCL 1 MG/ML IJ SOLN
0.2500 mg | INTRAMUSCULAR | Status: DC | PRN
  Administered 2024-04-04: 0.5 mg via INTRAVENOUS

## 2024-04-04 MED ORDER — ONDANSETRON HCL 4 MG/2ML IJ SOLN: 4.0000 mg | Freq: Four times a day (QID) | INTRAMUSCULAR | Status: DC | PRN

## 2024-04-04 MED ORDER — EPHEDRINE SULFATE-NACL 50-0.9 MG/10ML-% IV SOSY
PREFILLED_SYRINGE | INTRAVENOUS | Status: DC | PRN
  Administered 2024-04-04 (×2): 5 mg via INTRAVENOUS

## 2024-04-04 MED ORDER — 0.9 % SODIUM CHLORIDE (POUR BTL) OPTIME
TOPICAL | Status: DC | PRN
  Administered 2024-04-04: 1000 mL

## 2024-04-04 MED ORDER — HYDRALAZINE HCL 20 MG/ML IJ SOLN
INTRAMUSCULAR | Status: AC
  Filled 2024-04-04: qty 1

## 2024-04-04 MED ORDER — HYDROMORPHONE HCL 1 MG/ML IJ SOLN
INTRAMUSCULAR | Status: AC
  Filled 2024-04-04: qty 1

## 2024-04-04 MED ORDER — SODIUM CHLORIDE 0.9 % IR SOLN
Status: DC | PRN
  Administered 2024-04-04: 3000 mL

## 2024-04-04 MED ORDER — DOCUSATE SODIUM 100 MG PO CAPS
100.0000 mg | ORAL_CAPSULE | Freq: Two times a day (BID) | ORAL | Status: DC
  Administered 2024-04-04 – 2024-04-05 (×2): 100 mg via ORAL
  Filled 2024-04-04 (×2): qty 1

## 2024-04-04 MED ORDER — VANCOMYCIN HCL 1000 MG IV SOLR
INTRAVENOUS | Status: AC
  Filled 2024-04-04: qty 20

## 2024-04-04 MED ORDER — VANCOMYCIN HCL 1000 MG IV SOLR
INTRAVENOUS | Status: DC | PRN
  Administered 2024-04-04: 1000 mg via TOPICAL

## 2024-04-04 MED ORDER — LEVOTHYROXINE SODIUM 50 MCG PO TABS
50.0000 ug | ORAL_TABLET | Freq: Every day | ORAL | Status: DC
  Administered 2024-04-05: 50 ug via ORAL
  Filled 2024-04-04: qty 1

## 2024-04-04 MED ORDER — PROPOFOL 10 MG/ML IV BOLUS
INTRAVENOUS | Status: AC
  Filled 2024-04-04: qty 20

## 2024-04-04 MED ORDER — OXYCODONE HCL 5 MG/5ML PO SOLN: 5.0000 mg | Freq: Once | ORAL | Status: DC | PRN

## 2024-04-04 MED ORDER — FUROSEMIDE 20 MG PO TABS
20.0000 mg | ORAL_TABLET | Freq: Every day | ORAL | Status: DC
  Administered 2024-04-05: 20 mg via ORAL
  Filled 2024-04-04: qty 1

## 2024-04-04 MED ORDER — LIDOCAINE 2% (20 MG/ML) 5 ML SYRINGE
INTRAMUSCULAR | Status: AC
  Filled 2024-04-04: qty 5

## 2024-04-04 MED ORDER — BUSPIRONE HCL 15 MG PO TABS
7.5000 mg | ORAL_TABLET | Freq: Two times a day (BID) | ORAL | Status: DC
  Administered 2024-04-04 – 2024-04-05 (×2): 7.5 mg via ORAL
  Filled 2024-04-04: qty 2
  Filled 2024-04-04 (×3): qty 1
  Filled 2024-04-04: qty 2

## 2024-04-04 MED ORDER — LOPERAMIDE HCL 2 MG PO CAPS: 2.0000 mg | ORAL_CAPSULE | Freq: Every day | ORAL | Status: DC | PRN

## 2024-04-04 MED ORDER — DOXYCYCLINE HYCLATE 100 MG PO TABS: 100.0000 mg | ORAL_TABLET | Freq: Two times a day (BID) | ORAL | Status: DC

## 2024-04-04 MED ORDER — FENTANYL CITRATE (PF) 100 MCG/2ML IJ SOLN
INTRAMUSCULAR | Status: DC | PRN
  Administered 2024-04-04: 50 ug via INTRAVENOUS

## 2024-04-04 MED ORDER — CHLORHEXIDINE GLUCONATE 0.12 % MT SOLN
OROMUCOSAL | Status: AC
  Administered 2024-04-04: 15 mL via OROMUCOSAL
  Filled 2024-04-04: qty 15

## 2024-04-04 MED ORDER — INSULIN ASPART 100 UNIT/ML IJ SOLN
0.0000 [IU] | INTRAMUSCULAR | Status: DC
  Administered 2024-04-04: 5 [IU] via SUBCUTANEOUS
  Administered 2024-04-04: 3 [IU] via SUBCUTANEOUS
  Administered 2024-04-05: 2 [IU] via SUBCUTANEOUS
  Administered 2024-04-05: 3 [IU] via SUBCUTANEOUS
  Administered 2024-04-05: 2 [IU] via SUBCUTANEOUS
  Filled 2024-04-04: qty 3
  Filled 2024-04-04 (×2): qty 2
  Filled 2024-04-04: qty 5
  Filled 2024-04-04: qty 3

## 2024-04-04 MED ORDER — ONDANSETRON HCL 4 MG/2ML IJ SOLN
INTRAMUSCULAR | Status: DC | PRN
  Administered 2024-04-04: 4 mg via INTRAVENOUS

## 2024-04-04 MED ORDER — VERAPAMIL HCL ER 120 MG PO TBCR
120.0000 mg | EXTENDED_RELEASE_TABLET | Freq: Every day | ORAL | Status: DC
  Administered 2024-04-04: 120 mg via ORAL
  Filled 2024-04-04 (×3): qty 1

## 2024-04-04 MED ORDER — FENTANYL CITRATE (PF) 100 MCG/2ML IJ SOLN
INTRAMUSCULAR | Status: AC
  Filled 2024-04-04: qty 2

## 2024-04-04 MED ORDER — DEXAMETHASONE SOD PHOSPHATE PF 10 MG/ML IJ SOLN
INTRAMUSCULAR | Status: DC | PRN
  Administered 2024-04-04: 5 mg via INTRAVENOUS

## 2024-04-04 MED ORDER — ONDANSETRON HCL 4 MG PO TABS: 4.0000 mg | ORAL_TABLET | Freq: Four times a day (QID) | ORAL | Status: DC | PRN

## 2024-04-04 MED ORDER — OXYCODONE HCL 5 MG PO TABS: 5.0000 mg | ORAL_TABLET | Freq: Once | ORAL | Status: DC | PRN

## 2024-04-04 MED ORDER — ROCURONIUM BROMIDE 10 MG/ML (PF) SYRINGE
PREFILLED_SYRINGE | INTRAVENOUS | Status: AC
  Filled 2024-04-04: qty 10

## 2024-04-04 MED ORDER — ACETAMINOPHEN 500 MG PO TABS
ORAL_TABLET | ORAL | Status: AC
  Administered 2024-04-04: 1000 mg via ORAL
  Filled 2024-04-04: qty 2

## 2024-04-04 MED ORDER — PHENYLEPHRINE HCL-NACL 20-0.9 MG/250ML-% IV SOLN
INTRAVENOUS | Status: DC | PRN
  Administered 2024-04-04: 30 ug/min via INTRAVENOUS

## 2024-04-04 MED ORDER — HYDROMORPHONE HCL 1 MG/ML IJ SOLN
0.5000 mg | INTRAMUSCULAR | Status: DC | PRN
  Administered 2024-04-04: 1 mg via INTRAVENOUS
  Filled 2024-04-04: qty 1

## 2024-04-04 MED ORDER — ASPIRIN 81 MG PO TBEC: 81.0000 mg | DELAYED_RELEASE_TABLET | Freq: Every day | ORAL | Status: DC

## 2024-04-04 SURGICAL SUPPLY — 52 items
BAG COUNTER SPONGE SURGICOUNT (BAG) ×1 IMPLANT
BIT DRILL 3.2 PERIPHERAL SCREW (BIT) IMPLANT
BLADE SAW SAG 29X58X.64 (BLADE) IMPLANT
CHLORAPREP W/TINT 26 (MISCELLANEOUS) ×1 IMPLANT
COOLER ICEMAN CLASSIC (MISCELLANEOUS) ×1 IMPLANT
COVER SURGICAL LIGHT HANDLE (MISCELLANEOUS) ×1 IMPLANT
DRAPE IMP U-DRAPE 54X76 (DRAPES) ×1 IMPLANT
DRAPE INCISE IOBAN 66X45 STRL (DRAPES) ×1 IMPLANT
DRAPE U-SHAPE 47X51 STRL (DRAPES) ×2 IMPLANT
DRSG AQUACEL AG ADV 3.5X10 (GAUZE/BANDAGES/DRESSINGS) ×1 IMPLANT
DRSG XEROFORM 1X8 (GAUZE/BANDAGES/DRESSINGS) IMPLANT
ELECT CAUTERY BLADE 6.4 (BLADE) IMPLANT
ELECTRODE BLDE 4.0 EZ CLN MEGD (MISCELLANEOUS) ×1 IMPLANT
ELECTRODE REM PT RTRN 9FT ADLT (ELECTROSURGICAL) ×1 IMPLANT
GAUZE SPONGE 4X4 12PLY STRL (GAUZE/BANDAGES/DRESSINGS) IMPLANT
GLOVE BIO SURGEON STRL SZ7.5 (GLOVE) ×3 IMPLANT
GLOVE BIOGEL PI IND STRL 6.5 (GLOVE) ×1 IMPLANT
GLOVE BIOGEL PI IND STRL 8 (GLOVE) ×2 IMPLANT
GLOVE BIOGEL PI MICRO STRL 6 (GLOVE) ×1 IMPLANT
GLOVE INDICATOR 8.0 STRL GRN (GLOVE) ×1 IMPLANT
GOWN STRL REUS W/ TWL LRG LVL3 (GOWN DISPOSABLE) ×2 IMPLANT
INSERT HUM RET REV 3/4 36 +0 (Insert) IMPLANT
INSERT REVERSED SZ3/4 (Orthopedic Implant) IMPLANT
KIT BASIN OR (CUSTOM PROCEDURE TRAY) ×1 IMPLANT
KIT STABILIZATION SHOULDER (MISCELLANEOUS) ×1 IMPLANT
KIT TURNOVER KIT B (KITS) ×1 IMPLANT
MANIFOLD NEPTUNE II (INSTRUMENTS) ×1 IMPLANT
NDL HYPO 21X1 ECLIPSE (NEEDLE) ×1 IMPLANT
NDL MAYO TROCAR (NEEDLE) ×1 IMPLANT
PACK SHOULDER (CUSTOM PROCEDURE TRAY) ×1 IMPLANT
PACK UNIVERSAL I (CUSTOM PROCEDURE TRAY) ×1 IMPLANT
PAD ARMBOARD POSITIONER FOAM (MISCELLANEOUS) ×2 IMPLANT
PAD COLD SHLDR WRAP-ON (PAD) ×1 IMPLANT
RESTRAINT HEAD UNIVERSAL NS (MISCELLANEOUS) ×1 IMPLANT
SET HNDPC FAN SPRY TIP SCT (DISPOSABLE) ×1 IMPLANT
SLING ARM IMMOBILIZER LRG (SOFTGOODS) ×1 IMPLANT
SOLN 0.9% NACL POUR BTL 1000ML (IV SOLUTION) ×1 IMPLANT
SOLN STERILE WATER BTL 1000 ML (IV SOLUTION) ×1 IMPLANT
SPONGE T-LAP 18X18 ~~LOC~~+RFID (SPONGE) ×1 IMPLANT
STAPLER SKIN PROX 35W (STAPLE) ×1 IMPLANT
STEM FRACTURE STD SZ10 L 130 (Orthopedic Implant) IMPLANT
SUCTION TUBE FRAZIER 10FR DISP (SUCTIONS) ×1 IMPLANT
SUT ETHIBOND 2 V 37 (SUTURE) IMPLANT
SUT ETHILON 3 0 PS 1 (SUTURE) IMPLANT
SUT VIC AB 0 CT1 27XBRD ANBCTR (SUTURE) ×2 IMPLANT
SUT VIC AB 2-0 CT1 TAPERPNT 27 (SUTURE) ×2 IMPLANT
SYR 50ML LL SCALE MARK (SYRINGE) ×1 IMPLANT
TAPE CLOTH SURG 4X10 WHT LF (GAUZE/BANDAGES/DRESSINGS) IMPLANT
TAPE LABRALWHITE 1.5X36 (TAPE) IMPLANT
TAPE SUT LABRALTAP WHT/BLK (SUTURE) IMPLANT
TOWEL GREEN STERILE (TOWEL DISPOSABLE) ×1 IMPLANT
TRAY FOLEY MTR SLVR 16FR STAT (SET/KITS/TRAYS/PACK) ×1 IMPLANT

## 2024-04-04 NOTE — Op Note (Signed)
 Date of Surgery: 04/04/2024  INDICATIONS: Lacey [name] is a 73 y.o.-year-old female with right humeral stem loosening following reverse shoulder for fracture.  The risk and benefits of the procedure were discussed in detail and documented in the pre-operative evaluation.   PREOPERATIVE DIAGNOSIS: 1.  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  PROCEDURE: 1.  Revision right reverse shoulder arthroplasty humeral component  SURGEON: Elspeth LITTIE Parker MD  ASSISTANT: Conley Dawson, ATC  ANESTHESIA:  general  IV FLUIDS AND URINE: See anesthesia record.  ANTIBIOTICS: Ancef   ESTIMATED BLOOD LOSS: 50 mL.  IMPLANTS:  Implant Name Type Inv. Item Serial No. Manufacturer Lot No. LRB No. Used Action  STEM FRACTURE STD SZ10 L 130 - [id number] Orthopedic Implant STEM FRACTURE STD SZ10 L 130 [id number] [employer]  Right 1 Implanted  INSERT HUM RET REV 3/4 36 +0 - [id number] Insert INSERT HUM RET REV 3/4 36 +0 [id number] [employer]  Right 1 Implanted  INSERT REVERSED SZ3/4 - [id number] Orthopedic Implant INSERT REVERSED SZ3/4 [id number] [employer]  Right 1 Implanted    DRAINS: None  CULTURES: None  COMPLICATIONS: none  DESCRIPTION OF PROCEDURE:  Patient was identified in the preoperative holding area.  Anesthesia performed an interscalene nerve block after universal timeout was performed with nursing.  Ancef  was given 1 hour prior to skin incision.    The surgical site was scrubbed with a chlorhexidine  scrub brush and alcohol.  The patient was then prepped with chlorhexidine  skin prep.  The patient was subsequently taken back to the operating room.  Anesthesia was induced. The patient was transferred to the beachchair position.  All bony prominences were padded.  Final timeout was again performed.     The bony landmarks of the shoulder were marked with a marking pen. A delto-pectoral incision was made, extending up approximately 5  inches. The wound with then irrigated with dilute betadine . Cephalic vein was identified, and an protected. This was retracted medially. Subdeltoid and subpectoral lesions were released. Neurovascular structures were carefully protected. The Gelpi retractor was used to retract the deltoid and pectoralis major.    The deltoid was retracted laterally with a Brown humeral retractor.  The conjoined tendon was identified. The cleido-pectoral fascia was excised.  The axillary nerve was palpated and carefully protected throughout the procedure. At this point the subscapularis was peeled off from the lesser tuberosity with care to avoid dissection distally in order to protect the axillary nerve.  Once the joint was exposed the proximal humerus was delivered with external rotation and extension of the arm.  The humeral stem was found to be loose with the baseplate use as well.  There was some metallosis underneath the baseplate.  The entire thing was removed in entirety.  Following this the humerus was thoroughly irrigated and broached sequentially to a size 10.  A performed fracture stem was then placed and trialed with excellent rotational stability.  As result the decision was made to take this as the final option without cementing as there was excellent rotational and longitudinal fixation.  The glenoid component was stable and as result not revised.  Sequential polys were trialed and the decision was made to utilize a size 6 polyethylene component.  I was happy in final range of motion   Appropriate tension was noted on the conjoined tendon and deltoid muscle.  Extension was stable, external and internal rotation as well.  The subscap was pulled over but as this was not  able to reach comfortably decision was made not to repair in order to prevent limited in external rotation.  The wound was then irrigated. Vancomycin  powder was placed in the wound again for infection prevention.   The wound was then closed in  layers with 0 Vicryl interrupted in the deep subcu followed by 2-0 Vicryl in the superficial subcu and 3-0 nylon for skin.  An Aquacel dressing was applied as well as an Veterinary surgeon.  A shoulder immobilizer was applied.     Postoperative Plan: -The patient will begin the reverse shoulder rehab protocol  -Aspirin  325 mg daily will be used for 4 weeks for blood clot prevention -I will see the patient back in 2 weeks for first postoperative wound check    Elspeth LITTIE Parker, MD 5:05 PM

## 2024-04-04 NOTE — Interval H&P Note (Signed)
 History and Physical Interval Note:  04/04/2024 2:57 PM  Lacey [name]  has presented today for surgery, with the diagnosis of Status post reverse arthroplasty of shoulder, right.  The various methods of treatment have been discussed with the patient and family. After consideration of risks, benefits and other options for treatment, the patient has consented to  Procedure(s): REVISION, REVERSE TOTAL ARTHROPLASTY, SHOULDER (Right) as a surgical intervention.  The patient's history has been reviewed, patient examined, no change in status, stable for surgery.  I have reviewed the patient's chart and labs.  Questions were answered to the patient's satisfaction.     Maricela Kawahara

## 2024-04-04 NOTE — Discharge Instructions (Signed)
 Discharge Instructions    Attending Surgeon: Huel Cote, MD Office Phone Number: [phone]   Diagnosis and Procedures:    Surgeries Performed: Right shoulder reverse shoulder arthroplasty  Discharge Plan:    Diet: Resume usual diet. Begin with light or bland foods.  Drink plenty of fluids.  Activity:  Keep sling and dressing in place until your follow up visit in Physical Therapy You are advised to go home directly from the hospital or surgical center. Restrict your activities.  GENERAL INSTRUCTIONS: 1.  Keep your surgical site elevated above your heart for at least 5-7 days or longer to prevent swelling. This will improve your comfort and your overall recovery following surgery.     2. Please call Dr. Serena Croissant office at [phone] with questions Monday-Friday during business hours. If no one answers, please leave a message and someone should get back to the patient within 24 hours. For emergencies please call 911 or proceed to the emergency room.   3. Patient to notify surgical team if experiences any of the following: Bowel/Bladder dysfunction, uncontrolled pain, nerve/muscle weakness, incision with increased drainage or redness, nausea/vomiting and Fever greater than 101.0 F.  Be alert for signs of infection including redness, streaking, odor, fever or chills. Be alert for excessive pain or bleeding and notify your surgeon immediately.  WOUND INSTRUCTIONS:   Leave your dressing/cast/splint in place until your post operative visit.  Keep it clean and dry.  Always keep the incision clean and dry until the staples/sutures are removed. If there is no drainage from the incision you should keep it open to air. If there is drainage from the incision you must keep it covered at all times until the drainage stops  Do not soak in a bath tub, hot tub, pool, lake or other body of water until 21 days after your surgery and your incision is completely dry and healed.  If you  have removable sutures (or staples) they must be removed 10-14 days (unless otherwise instructed) from the day of your surgery.     1)  Elevate the extremity as much as possible.  2)  Keep the dressing clean and dry.  3)  Please call us if the dressing becomes wet or dirty.  4)  If you are experiencing worsening pain or worsening swelling, please call.     MEDICATIONS: Resume all previous home medications at the previous prescribed dose and frequency unless otherwise noted Start taking the  pain medications on an as-needed basis as prescribed  Please taper down pain medication over the next week following surgery.  Ideally you should not require a refill of any narcotic pain medication.  Take pain medication with food to minimize nausea. In addition to the prescribed pain medication, you may take over-the-counter pain relievers such as Tylenol.  Do NOT take additional tylenol if your pain medication already has tylenol in it.  Aspirin 325mg  daily per bottle instructions. Narcotic Policy: Per [facility] clinic policy, our goal is ensure optimal postoperative pain control with a multimodal pain management strategy. For all [facility] patients, our goal is to wean post-operative narcotic medications by 6 weeks post-operatively, and many times sooner. If this is not possible due to utilization of pain medication prior to surgery, your [facility] doctor will support your acute post-operative pain control for the first 6 weeks postoperatively, with a plan to transition you back to your primary pain team following that. Cyndia Skeeters will work to ensure a Therapist, occupational.  FOLLOWUP INSTRUCTIONS: 1. Follow up at the Physical Therapy Clinic 3-4 days following surgery. This appointment should be scheduled unless other arrangements have been made.The Physical Therapy scheduling number is [phone] if an appointment has not already been arranged.  2. Contact Dr. Serena Croissant office during office hours at  [phone] or the practice after hours line at [phone] for non-emergencies. For medical emergencies call 911.   Discharge Location: Home
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
## 2024-04-04 NOTE — Brief Op Note (Signed)
   Brief Op Note  Date of Surgery: 04/04/2024  Preoperative Diagnosis: Status post reverse arthroplasty of shoulder, right  Postoperative Diagnosis: same  Procedure: Procedure(s): REVISION RIGHT SHOULDER ARTHROPLASTY  Implants: Implant Name Type Inv. Item Serial No. Manufacturer Lot No. LRB No. Used Action  STEM FRACTURE STD SZ10 L 130 - [id number] Orthopedic Implant STEM FRACTURE STD SZ10 L 130 [id number] [employer]  Right 1 Implanted  INSERT HUM RET REV 3/4 36 +0 - [id number] Insert INSERT HUM RET REV 3/4 36 +0 [id number] [employer]  Right 1 Implanted  INSERT REVERSED SZ3/4 - [id number] Orthopedic Implant INSERT REVERSED SZ3/4 [id number] [employer]  Right 1 Implanted    Surgeons: Surgeon(s): Genelle Standing, MD  Anesthesia: General    Estimated Blood Loss: See anesthesia record  Complications: None  Condition to PACU: Stable  Standing LITTIE Genelle, MD 04/04/2024 5:04 PM

## 2024-04-04 NOTE — Anesthesia Procedure Notes (Signed)
 Procedure Name: Intubation Date/Time: 04/04/2024 3:28 PM  Performed by: Elby Raelene SAUNDERS, CRNAPre-anesthesia Checklist: Patient identified, Emergency Drugs available, Suction available and Patient being monitored Patient Re-evaluated:Patient Re-evaluated prior to induction Oxygen Delivery Method: Circle System Utilized Preoxygenation: Pre-oxygenation with 100% oxygen Induction Type: IV induction Ventilation: Mask ventilation without difficulty Laryngoscope Size: Miller and 2 Grade View: Grade I Tube type: Oral Tube size: 7.0 mm Number of attempts: 1 Airway Equipment and Method: Stylet and Bite block Placement Confirmation: ETT inserted through vocal cords under direct vision, positive ETCO2 and breath sounds checked- equal and bilateral Secured at: 21 cm Tube secured with: Tape Dental Injury: Teeth and Oropharynx as per pre-operative assessment

## 2024-04-04 NOTE — Anesthesia Procedure Notes (Signed)
 Anesthesia Regional Block: Interscalene brachial plexus block   Pre-Anesthetic Checklist: , timeout performed,  Correct Patient, Correct Site, Correct Laterality,  Correct Procedure, Correct Position, site marked,  Risks and benefits discussed,  Surgical consent,  Pre-op evaluation,  At surgeon's request and post-op pain management  Laterality: Right  Prep: chloraprep       Needles:  Injection technique: Single-shot  Needle Type: Stimiplex     Needle Length: 9cm  Needle Gauge: 21     Additional Needles:   Procedures:,,,, ultrasound used (permanent image in chart),,    Narrative:  Start time: 04/04/2024 3:03 PM End time: 04/04/2024 3:08 PM Injection made incrementally with aspirations every 5 mL.  Performed by: Personally  Anesthesiologist: Cleotilde Butler Dade, MD

## 2024-04-04 NOTE — Plan of Care (Signed)
  Problem: Education: Goal: Knowledge of General Education information will improve Description: Including pain rating scale, medication(s)/side effects and non-pharmacologic comfort measures Outcome: Progressing   Problem: Clinical Measurements: Goal: Ability to maintain clinical measurements within normal limits will improve Outcome: Progressing Goal: Will remain free from infection Outcome: Progressing Goal: Diagnostic test results will improve Outcome: Progressing Goal: Respiratory complications will improve Outcome: Progressing Goal: Cardiovascular complication will be avoided Outcome: Progressing   Problem: Activity: Goal: Risk for activity intolerance will decrease Outcome: Progressing   Problem: Nutrition: Goal: Adequate nutrition will be maintained Outcome: Progressing   Problem: Coping: Goal: Level of anxiety will decrease Outcome: Progressing   Problem: Elimination: Goal: Will not experience complications related to bowel motility Outcome: Progressing Goal: Will not experience complications related to urinary retention Outcome: Progressing   Problem: Pain Managment: Goal: General experience of comfort will improve and/or be controlled Outcome: Progressing   Problem: Safety: Goal: Ability to remain free from injury will improve Outcome: Progressing   Problem: Skin Integrity: Goal: Risk for impaired skin integrity will decrease Outcome: Progressing   Problem: Fluid Volume: Goal: Ability to maintain a balanced intake and output will improve Outcome: Progressing   Problem: Metabolic: Goal: Ability to maintain appropriate glucose levels will improve Outcome: Progressing   Problem: Nutritional: Goal: Maintenance of adequate nutrition will improve Outcome: Progressing Goal: Progress toward achieving an optimal weight will improve Outcome: Progressing   Problem: Skin Integrity: Goal: Risk for impaired skin integrity will decrease Outcome:  Progressing   Problem: Tissue Perfusion: Goal: Adequacy of tissue perfusion will improve Outcome: Progressing

## 2024-04-04 NOTE — Transfer of Care (Signed)
 Immediate Anesthesia Transfer of Care Note  Patient: Lacey [name]  Procedure(s) Performed: REVISION RIGHT SHOULDER ARTHROPLASTY (Right: Shoulder)  Patient Location: PACU  Anesthesia Type:General  Level of Consciousness: drowsy and patient cooperative  Airway & Oxygen Therapy: Patient connected to nasal cannula oxygen  Post-op Assessment: Report given to RN, Post -op Vital signs reviewed and stable, and Patient moving all extremities X 4  Post vital signs: Reviewed and stable  Last Vitals:  Vitals Value Taken Time  BP 175/52 04/04/24 17:25  Temp    Pulse 81 04/04/24 17:27  Resp 18 04/04/24 17:27  SpO2 93% 04/04/24 17:27  Vitals shown include unfiled device data.  Last Pain:  Vitals:   04/04/24 1505  TempSrc:   PainSc: 0-No pain      Patients Stated Pain Goal: 2 (04/04/24 1346)  Complications: No notable events documented.

## 2024-04-05 ENCOUNTER — Other Ambulatory Visit (HOSPITAL_BASED_OUTPATIENT_CLINIC_OR_DEPARTMENT_OTHER): Payer: Self-pay | Admitting: Orthopaedic Surgery

## 2024-04-05 ENCOUNTER — Other Ambulatory Visit (HOSPITAL_COMMUNITY): Payer: Self-pay

## 2024-04-05 ENCOUNTER — Observation Stay (HOSPITAL_COMMUNITY)

## 2024-04-05 DIAGNOSIS — S42301A Unspecified fracture of shaft of humerus, right arm, initial encounter for closed fracture: Secondary | ICD-10-CM | POA: Diagnosis not present

## 2024-04-05 DIAGNOSIS — Z96611 Presence of right artificial shoulder joint: Secondary | ICD-10-CM | POA: Diagnosis not present

## 2024-04-05 DIAGNOSIS — M19011 Primary osteoarthritis, right shoulder: Secondary | ICD-10-CM | POA: Diagnosis not present

## 2024-04-05 LAB — GLUCOSE, CAPILLARY
Glucose-Capillary: 149 mg/dL — ABNORMAL HIGH (ref 70–99)
Glucose-Capillary: 150 mg/dL — ABNORMAL HIGH (ref 70–99)
Glucose-Capillary: 191 mg/dL — ABNORMAL HIGH (ref 70–99)

## 2024-04-05 MED ORDER — ACETAMINOPHEN EXTRA STRENGTH 500 MG PO TABS
500.0000 mg | ORAL_TABLET | Freq: Three times a day (TID) | ORAL | 0 refills | Status: AC
  Filled 2024-04-05: qty 45, 15d supply, fill #0

## 2024-04-05 MED ORDER — ASPIRIN 325 MG PO TBEC
325.0000 mg | DELAYED_RELEASE_TABLET | Freq: Every day | ORAL | 0 refills | Status: AC
  Filled 2024-04-05: qty 30, 30d supply, fill #0

## 2024-04-05 MED ORDER — OXYCODONE HCL 5 MG PO TABS
5.0000 mg | ORAL_TABLET | ORAL | 0 refills | Status: AC | PRN
  Filled 2024-04-05: qty 25, 5d supply, fill #0

## 2024-04-05 NOTE — Progress Notes (Signed)
   Subjective:  Patient reports pain as mild.Nerve block in effect. Tolerating diet. Voiding.   Objective:   VITALS:   Vitals:   04/04/24 1933 04/04/24 2150 04/05/24 0354 04/05/24 0757  BP: (!) 149/48 (!) 149/48 (!) 140/51 (!) 166/51  Pulse: 60  60 (!) 57  Resp: 18  17   Temp: 97.9 F (36.6 C)  98.2 F (36.8 C) 98.2 F (36.8 C)  TempSrc: Oral  Oral Oral  SpO2: 93%  93% 94%  Weight:      Height:       Status post right shoulder reverse revision. Dressing CDI. Block in effect strong 2+ RP.   Lab Results  Component Value Date   WBC 13.5 (H) 04/04/2024   HGB 12.1 04/04/2024   HCT 36.6 04/04/2024   MCV 94.8 04/04/2024   PLT 161 04/04/2024     Assessment/Plan:  1 Day Post-Op right humeral revision status post reverse arthroplasty  - Expected postop acute blood loss anemia - will monitor for symptoms - Patient to work with PT/OT to optimize mobilization safely - DVT ppx - SCDs, ambulation, Aspirin  325mg  on DC - NWB operative extremity - Pain control - multimodal pain management, ATC acetaminophen  in conjunction with as needed narcotic (oxycodone ), although this should be minimized with other modalities  - Discharge planning pending CM, appreciate coordination   Lacey [name] 04/05/2024, 8:18 AM

## 2024-04-05 NOTE — Anesthesia Postprocedure Evaluation (Signed)
 Anesthesia Post Note  Patient: Lacey [name]  Procedure(s) Performed: REVISION RIGHT SHOULDER ARTHROPLASTY (Right: Shoulder)     Patient location during evaluation: PACU Anesthesia Type: General Level of consciousness: awake and alert Pain management: pain level controlled Vital Signs Assessment: post-procedure vital signs reviewed and stable Respiratory status: spontaneous breathing, nonlabored ventilation and respiratory function stable Cardiovascular status: blood pressure returned to baseline and stable Postop Assessment: no apparent nausea or vomiting Anesthetic complications: no   No notable events documented.  Last Vitals:  Vitals:   04/05/24 0354 04/05/24 0757  BP: (!) 140/51 (!) 166/51  Pulse: 60 (!) 57  Resp: 17   Temp: 36.8 C 36.8 C  SpO2: 93% 94%    Last Pain:  Vitals:   04/05/24 0757  TempSrc: Oral  PainSc:                  Butler Levander Pinal

## 2024-04-05 NOTE — TOC Transition Note (Incomplete)
 Transition of Care [facility]) - Discharge Note   Patient Details  Name: Lacey [name] MRN: [id number] Date of Birth: [birth year]  [facility] CM/SW Contact:  Rosalva Jon Bloch, RN Phone Number: 04/05/2024, 1:03 PM   Clinical Narrative:    Patient will DC to: home Anticipated DC date: 04/05/2024 Family notified: yes Transport by: car     Per MD patient ready for DC today pending therapy clearance. RN, patient, and  patient's family notified of DC. Pt agreeable to home health service. Preference: [facility].   RNCM will sign off for now as intervention is no longer needed. Please consult us  again if new needs arise.    Final next level of care: Home w Home Health Services     Patient Goals and CMS Choice     Choice offered to / list presented to : Patient      Discharge Placement                       Discharge Plan and Services Additional resources added to the After Visit Summary for                  DME Arranged: Bedside commode DME Agency: [employer] Date DME Agency Contacted: 04/05/24 Time DME Agency Contacted: 1105 Representative spoke with at DME Agency: [facility] HH Arranged: PT, OT [facility] Agency: [facility] Date HH Agency Contacted: 04/05/24 Time HH Agency Contacted: 1302 Representative spoke with at [facility] Agency: [facility]  Social Drivers of Health (SDOH) Interventions SDOH Screenings   Food Insecurity: No Food Insecurity (04/05/2024)  Housing: Unknown (04/05/2024)  Transportation Needs: No Transportation Needs (04/05/2024)  Utilities: Not At Risk (04/05/2024)  Alcohol Screen: Low Risk  (02/08/2024)  Depression (PHQ2-9): Low Risk  (02/08/2024)  Financial Resource Strain: Low Risk  (02/25/2024)  Physical Activity: Insufficiently Active (02/25/2024)  Social Connections: Moderately Integrated (04/05/2024)  Stress: Stress Concern Present (02/25/2024)  Tobacco Use: High Risk (04/04/2024)  Health Literacy: Adequate  Health Literacy (02/08/2024)     Readmission Risk Interventions     No data to display

## 2024-04-05 NOTE — Discharge Summary (Signed)
 Patient ID: Lacey [name] MRN: [id number] DOB/AGE: 12/01/1950 73 y.o.  Admit date: 04/04/2024 Discharge date: 04/05/2024  Admission Diagnoses:  Status post reverse arthroplasty of right shoulder  Discharge Diagnoses:  Principal Problem:   Status post reverse arthroplasty of right shoulder   Past Medical History:  Diagnosis Date   Adenomatous colon polyp    hyperplastic   Anemia    Anginal pain    not recent   Anxiety    Arthritis    NECK, KNEES, FINGERS, TOES   Atrial tachycardia CARDIOLOGIST - DR FERNANDE (LAST VISIT AUG 2012)   Echo 12/11: EF 55-60%, Mild LVH, grade 1 diast dysfxn;   holter 1/12: ATach   Atypical chest pain    a. 07/2004 Cath: Clean cors;  b. 04/2010 Myoview : EF 63%, no ischemia   Blood transfusion without reported diagnosis 1969   CHF (congestive heart failure) (HCC)    Chronic kidney disease    left kidney small    Coronary artery disease    Diabetes mellitus, type 2 (HCC)    ORAL AND INSULIN  MEDS (LAST A1C  7.3)   Diverticulosis    Erythema CURRENT-- CLOSED ABD. WALL ABSCESS   PER PCP NOTE (03-31-11)- MRSA-- TAKES DOXYCYCLINE    GERD (gastroesophageal reflux disease)    CONTROLLED W/ PREVACID   History of kidney stones 2012   Hyperlipidemia    Hypertension    Insomnia    TAKES MEDS PRN   Neuropathy, peripheral    Obesity (BMI 30-39.9) 02/19/2015   Pneumonia    as child   Post op infection 11/07/2012   left bunionectomy   Restless leg syndrome    Sepsis, urinary HISTORY - 2004   Sleep apnea    no cpap    Stroke (HCC)    08-2023. stroke x2   Thyroid  disease    TIA (transient ischemic attack)    mini strokes 2023   Tremor    Vitamin D  deficiency     Surgeries: Procedure(s): REVISION RIGHT SHOULDER ARTHROPLASTY on 04/04/2024   Consultants (if any):   Discharged Condition: Improved  Hospital Course: Lacey [name] is an 73 y.o. female who was admitted 04/04/2024 with a diagnosis of Status post reverse arthroplasty of right shoulder and  went to the operating room on 04/04/2024 and underwent the above named procedures.    She was given perioperative antibiotics:  Anti-infectives (From admission, onward)    Start     Dose/Rate Route Frequency Ordered Stop   04/04/24 2200  doxycycline  (VIBRA -TABS) tablet 100 mg  Status:  Discontinued        100 mg Oral 2 times daily 04/04/24 1830 04/04/24 1843   04/04/24 1654  vancomycin  (VANCOCIN ) powder  Status:  Discontinued          As needed 04/04/24 1654 04/04/24 1721   04/04/24 1345  ceFAZolin  (ANCEF ) IVPB 2g/100 mL premix        2 g 200 mL/hr over 30 Minutes Intravenous On call to O.R. 04/04/24 1323 04/04/24 1610     .  She was given sequential compression devices, early ambulation, and appropriate chemoprophylaxis for DVT prophylaxis.  She benefited maximally from the hospital stay and there were no complications.    Recent vital signs:  Vitals:   04/05/24 0354 04/05/24 0757  BP: (!) 140/51 (!) 166/51  Pulse: 60 (!) 57  Resp: 17   Temp: 98.2 F (36.8 C) 98.2 F (36.8 C)  SpO2: 93% 94%    Recent laboratory studies:  Lab  Results  Component Value Date   HGB 12.1 04/04/2024   HGB 12.8 03/23/2024   HGB 13.3 01/13/2024   Lab Results  Component Value Date   WBC 13.5 (H) 04/04/2024   PLT 161 04/04/2024   Lab Results  Component Value Date   INR 1.09 07/08/2016   Lab Results  Component Value Date   NA 137 03/23/2024   K 4.1 03/23/2024   CL 103 03/23/2024   CO2 28 03/23/2024   BUN 13 03/23/2024   CREATININE 0.80 04/04/2024   GLUCOSE 296 (H) 03/23/2024    Discharge Medications:   Allergies as of 04/05/2024   No Known Allergies      Medication List     TAKE these medications    acetaminophen  500 MG tablet Commonly known as: TYLENOL  Take 1 tablet (500 mg total) by mouth every 8 (eight) hours for 10 days.   albuterol  108 (90 Base) MCG/ACT inhaler Commonly known as: VENTOLIN  HFA Inhale 2 puffs into the lungs every 6 (six) hours as needed for wheezing  or shortness of breath.   aspirin  EC 325 MG tablet Take 1 tablet (325 mg total) by mouth daily. What changed: Another medication with the same name was removed. Continue taking this medication, and follow the directions you see here.   atorvastatin  40 MG tablet Commonly known as: Lipitor Take 1 tablet (40 mg total) by mouth daily.   B-D ULTRAFINE III SHORT PEN 31G X 8 MM Misc Generic drug: Insulin  Pen Needle USE 4 TIMES A DAY   busPIRone  7.5 MG tablet Commonly known as: BUSPAR  TAKE 1 TABLET BY MOUTH TWICE A DAY   calcium -vitamin D  500-5 MG-MCG tablet Commonly known as: OSCAL WITH D Take 1 tablet by mouth daily with breakfast.   clonazePAM  1 MG tablet Commonly known as: KLONOPIN  TAKE 1 TABLET AT BEDTIME FOR RESTLESS LEGS SYNDROME   diclofenac  Sodium 1 % Gel Commonly known as: VOLTAREN  APPLY 2 GRAMS TO AFFECTED AREA 4 TIMES A DAY What changed: See the new instructions.   doxycycline  100 MG tablet Commonly known as: VIBRA -TABS Take 1 tablet (100 mg total) by mouth 2 (two) times daily.   DropSafe Alcohol Prep 70 % Pads USE AS DIRECTED  AS NEEDED.   fenofibrate  160 MG tablet TAKE 1 TABLET DAILY   furosemide  20 MG tablet Commonly known as: LASIX  TAKE 1 TABLET EVERY DAY What changed:  when to take this reasons to take this   gabapentin  300 MG capsule Commonly known as: NEURONTIN  TAKE 3 CAPSULES BY MOUTH 3 TIMES DAILY.   Gemtesa 75 MG Tabs Generic drug: Vibegron Take 75 mg by mouth daily.   glucose blood test strip 1 each by Other route as needed for other. Test 4 times daily   glucose blood test strip 1 each by Other route 4 (four) times daily - after meals and at bedtime. Use as instructed   guaiFENesin -codeine  100-10 MG/5ML syrup Take 10 mLs by mouth every 6 (six) hours as needed for cough.   Lancets Super Thin 28G Misc Please use as directed to test sugars 4 times daily. Dx. E11.9   OneTouch UltraSoft 2 Lancets Misc 1 Device by Does not apply route 4  (four) times daily.   lansoprazole 15 MG capsule Commonly known as: PREVACID Take 15 mg by mouth daily before breakfast.   Lantus  SoloStar 100 UNIT/ML Solostar Pen Generic drug: insulin  glargine INJECT 60 UNITS UNDER THE SKIN AT BEDTIME What changed: See the new instructions.   levothyroxine  50  MCG tablet Commonly known as: SYNTHROID  TAKE 1 TABLET BY MOUTH EVERY DAY BEFORE BREAKFAST   loperamide 2 MG tablet Commonly known as: IMODIUM A-D Take 2 mg by mouth daily as needed for diarrhea or loose stools.   NovoLOG  FlexPen 100 UNIT/ML FlexPen Generic drug: insulin  aspart INJECT 25 UNITS SUBCUTANEOUSLY THREE TIMES DAILY WITH MEALS   oxyCODONE  5 MG immediate release tablet Commonly known as: Roxicodone  Take 1 tablet (5 mg total) by mouth every 4 (four) hours as needed for severe pain (pain score 7-10) or breakthrough pain.   primidone  50 MG tablet Commonly known as: MYSOLINE  TAKE 2 TABLETS BY MOUTH 2 TIMES DAILY.   promethazine -dextromethorphan 6.25-15 MG/5ML syrup Commonly known as: PROMETHAZINE -DM Take 5 mLs by mouth 4 (four) times daily as needed.   sertraline  100 MG tablet Commonly known as: ZOLOFT  TAKE 1 TABLET DAILY   traZODone  100 MG tablet Commonly known as: DESYREL  TAKE 1 TABLET AT BEDTIME   True Metrix Meter w/Device Kit Use as directed 3-4 times daily.  Dx- type 2 diabetes w/ use of insulin    valsartan  320 MG tablet Commonly known as: DIOVAN  TAKE 1 TABLET DAILY   verapamil  120 MG CR tablet Commonly known as: CALAN -SR TAKE 1 TABLET BY MOUTH EVERYDAY AT BEDTIME        Diagnostic Studies: No results found.  Disposition:      Follow-up Information     Genelle Standing, MD Follow up.   Specialty: Orthopedic Surgery Contact information: [address] [place] [address] [phone]                  Signed: STANDING GENELLE 04/05/2024, 8:23 AM

## 2024-04-05 NOTE — Care Management Obs Status (Signed)
 MEDICARE OBSERVATION STATUS NOTIFICATION   Patient Details  Name: Lacey [name] MRN: [id number] Date of Birth: 1950-12-27   Medicare Observation Status Notification Given:  Yes    Jennie Laneta Dragon 04/05/2024, 10:51 AM

## 2024-04-06 ENCOUNTER — Telehealth: Payer: Self-pay | Admitting: *Deleted

## 2024-04-06 ENCOUNTER — Telehealth: Payer: Self-pay | Admitting: Orthopaedic Surgery

## 2024-04-06 NOTE — Telephone Encounter (Signed)
 Redell from [facility] called wanting verbal orders for pt. Home health pt for 1Xwk for 5 wks, 2Xwf or 3 wks, 1xwk for 1wk. Call back number is [phone], confidential number. He also wants the protical for reverse total shoulder replacement.

## 2024-04-06 NOTE — Transitions of Care (Post Inpatient/ED Visit) (Signed)
 04/06/2024  Name: Lacey [name] MRN: [id number] DOB: 05/04/1951  Today's TOC FU Call Status: Today's TOC FU Call Status:: Successful TOC FU Call Completed TOC FU Call Complete Date: 04/06/24  Patient's Name and Date of Birth confirmed. Name, DOB  Transition Care Management Follow-up Telephone Call Date of Discharge: 04/05/24 Discharge Facility: [facility]) Type of Discharge: Inpatient Admission Primary Inpatient Discharge Diagnosis:: Planned surgical (R) shoulder arthroplasty- revision: overnight hospitalization How have you been since you were released from the hospital?: Better (I am doing okay; have been through this shoulder surgery before and know what to do; I am just taking my time and managing well so far.  The PT will be coming out either today or tomorrow.  I was not told that I need to see Dr. Mahlon after the surgery) Any questions or concerns?: No  Items Reviewed: Did you receive and understand the discharge instructions provided?: Yes (thoroughly reviewed with patient who verbalizes good understanding of same) Medications obtained,verified, and reconciled?: Yes (Medications Reviewed) (Full medication reconciliation/ review completed; no concerns or discrepancies identified; confirmed patient obtained/ is taking all newly Rx'd medications as instructed; self-manages medications and denies questions/ concerns around medications today) Extensive medication review requiring > 30 minutes dedicated time: patient reports just home from the hospital; reports not organized yet Any new allergies since your discharge?: No Dietary orders reviewed?: Yes Type of Diet Ordered:: Regular try to limit my sugar and carbohydrates Do you have support at home?: Yes People in Home [RPT]: spouse Name of Support/Comfort Primary Source: Reports essentially independent in self-care activities; resides with supportive spouse and has local son and granddaughter: all assists as/ if needed/  indicated  Medications Reviewed Today: Medications Reviewed Today     Reviewed by Lawson Isabell M, RN (Registered Nurse) on 04/06/24 at 1106  Med List Status: <None>   Medication Order Taking? Sig Documenting Provider Last Dose Status Informant  Acetaminophen  Extra Strength 500 MG TABS [id number] Yes Take 1 tablet (500 mg total) by mouth every 8 (eight) hours for 10 days. Genelle Standing, MD  Active   albuterol  (VENTOLIN  HFA) 108 [phone] Base) MCG/ACT inhaler [id number]  Inhale 2 puffs into the lungs every 6 (six) hours as needed for wheezing or shortness of breath.  Patient not taking: Reported on 04/06/2024   Tabori, Katherine E, MD  Active Self  Alcohol Swabs (DROPSAFE ALCOHOL PREP) 70 % PADS [id number] Yes USE AS DIRECTED  AS NEEDED. Tabori, Katherine E, MD  Active Self  aspirin  EC 325 MG tablet [id number] Yes Take 1 tablet (325 mg total) by mouth daily. Genelle Standing, MD  Active   atorvastatin  (LIPITOR) 40 MG tablet [id number] Yes Take 1 tablet (40 mg total) by mouth daily. Barbaraann Darryle Ned, MD  Active Self  B-D ULTRAFINE III SHORT PEN 31G X 8 MM MISC [id number] Yes USE 4 TIMES A DAY Tabori, Katherine E, MD  Active Self  Blood Glucose Monitoring Suppl (TRUE METRIX METER) w/Device KIT [id number] Yes Use as directed 3-4 times daily.  Dx- type 2 diabetes w/ use of insulin  Tabori, Katherine E, MD  Active Self  busPIRone  (BUSPAR ) 7.5 MG tablet [id number] Yes TAKE 1 TABLET BY MOUTH TWICE A DAY  Patient taking differently: Take 7.5 mg by mouth 2 (two) times daily. 04/06/24: Reports during TOC call takes prn   Tabori, Katherine E, MD  Active Self  calcium -vitamin D  (OSCAL WITH D) 500-5 MG-MCG tablet [id number] Yes Take 1 tablet by mouth daily with breakfast. [provider]  Active Self  clonazePAM  (KLONOPIN ) 1 MG tablet [id number] Yes TAKE 1 TABLET AT BEDTIME FOR RESTLESS LEGS SYNDROME Tabori, Katherine E, MD  Active Self  diclofenac  Sodium (VOLTAREN ) 1 % GEL [id number] Yes APPLY 2 GRAMS TO  AFFECTED AREA 4 TIMES A DAY Tabori, Katherine E, MD  Active Self  doxycycline  (VIBRA -TABS) 100 MG tablet [id number]  Take 1 tablet (100 mg total) by mouth 2 (two) times daily.  Patient not taking: Reported on 04/06/2024   Tabori, Katherine E, MD  Active Self  fenofibrate  160 MG tablet [id number] Yes TAKE 1 TABLET DAILY Tabori, Katherine E, MD  Active Self  furosemide  (LASIX ) 20 MG tablet [id number] Yes TAKE 1 TABLET EVERY DAY  Patient taking differently: Take 20 mg by mouth daily as needed for fluid. 04/06/24: Reports during TOC call, takes most every day but not every day   Tabori, Katherine E, MD  Active Self  gabapentin  (NEURONTIN ) 300 MG capsule [id number] Yes TAKE 3 CAPSULES BY MOUTH 3 TIMES DAILY. Mahlon Comer BRAVO, MD  Active Self  glucose blood test strip [id number] Yes 1 each by Other route as needed for other. Test 4 times daily [provider]  Active Self  glucose blood test strip [id number] Yes 1 each by Other route 4 (four) times daily - after meals and at bedtime. Use as instructed Tabori, Katherine E, MD  Active Self  guaiFENesin -codeine  100-10 MG/5ML syrup [id number]  Take 10 mLs by mouth every 6 (six) hours as needed for cough.  Patient not taking: Reported on 04/06/2024   Tabori, Katherine E, MD  Active Self  insulin  aspart (NOVOLOG  FLEXPEN) 100 UNIT/ML FlexPen [id number] Yes INJECT 25 UNITS SUBCUTANEOUSLY THREE TIMES DAILY WITH MEALS Tabori, Katherine E, MD  Active Self  insulin  glargine (LANTUS  SOLOSTAR) 100 UNIT/ML Solostar Pen [id number] Yes INJECT 60 UNITS UNDER THE SKIN AT BEDTIME Tabori, Katherine E, MD  Active Self  Lancets Super Thin 28G MISC [id number] Yes Please use as directed to test sugars 4 times daily. Dx. E11.9 Tabori, Katherine E, MD  Active Self  lansoprazole (PREVACID) 15 MG capsule [id number] Yes Take 15 mg by mouth daily before breakfast. [provider]  Active Self  levothyroxine  (SYNTHROID ) 50 MCG tablet [id number] Yes TAKE 1 TABLET BY MOUTH EVERY  DAY BEFORE BREAKFAST Tabori, Katherine E, MD  Active Self  loperamide (IMODIUM A-D) 2 MG tablet [id number]  Take 2 mg by mouth daily as needed for diarrhea or loose stools.  Patient not taking: Reported on 04/06/2024   [provider]  Active Self  OneTouch UltraSoft 2 Lancets MISC [id number] Yes 1 Device by Does not apply route 4 (four) times daily. Tabori, Katherine E, MD  Active Self  oxyCODONE  (ROXICODONE ) 5 MG immediate release tablet [id number] Yes Take 1 tablet (5 mg total) by mouth every 4 (four) hours as needed for severe pain (pain score 7-10) or breakthrough pain. Genelle Standing, MD  Active   primidone  (MYSOLINE ) 50 MG tablet [id number] Yes TAKE 2 TABLETS BY MOUTH 2 TIMES DAILY. Evonnie Asberry RAMAN, DO  Active Self  promethazine -dextromethorphan (PROMETHAZINE -DM) 6.25-15 MG/5ML syrup [id number]  Take 5 mLs by mouth 4 (four) times daily as needed.  Patient not taking: Reported on 04/06/2024   Tabori, Katherine E, MD  Active Self  sertraline  (ZOLOFT ) 100 MG tablet [id number] Yes TAKE 1 TABLET DAILY Tabori, Katherine E, MD  Active Self  traZODone  (DESYREL ) 100 MG tablet [id number] Yes TAKE 1 TABLET AT BEDTIME Tabori, Katherine E, MD  Active Self  valsartan  (  DIOVAN ) 320 MG tablet [id number] Yes TAKE 1 TABLET DAILY Tabori, Katherine E, MD  Active Self  verapamil  (CALAN -SR) 120 MG CR tablet [id number] Yes TAKE 1 TABLET BY MOUTH EVERYDAY AT BEDTIME Tabori, Katherine E, MD  Active Self  Vibegron [facility]) 75 MG TABS [id number] Yes Take 75 mg by mouth daily. [provider]  Active Self           Home Care and Equipment/Supplies: Were Home Health Services Ordered?: Yes Name of Home Health Agency:: [facility]- PT/ OT Has Agency set up a time to come to your home?: Yes First Home Health Visit Date: 04/07/24 (they told me they will be coming either today or tomorrow) Any new equipment or medical supplies ordered?: Yes (Bedside commode) Name of Medical supply agency?: [employer] Were you  able to get the equipment/medical supplies?: Yes Do you have any questions related to the use of the equipment/supplies?: No  Functional Questionnaire: Do you need assistance with bathing/showering or dressing?: No (family assists as indicated) Do you need assistance with meal preparation?: Yes (family assists as indicated) Do you need assistance with eating?: No Do you have difficulty maintaining continence: Yes (wears incontinence pads as per baseline) Do you need assistance with getting out of bed/getting out of a chair/moving?: Yes (family assists as indicated after recent planned surgery) Do you have difficulty managing or taking your medications?: No  Follow up appointments reviewed: PCP Follow-up appointment confirmed?: No (Declined care coordination outreach in real-time with scheduling care guide to schedule hospital follow up PCP appointment- they told me I just need to see the surgeon.  I just saw Dr. Mahlon before the surgery happened) MD Provider Line Number:[phone] Given: No Specialist Hospital Follow-up appointment confirmed?: Yes Date of Specialist follow-up appointment?: 04/11/24 Follow-Up Specialty Provider:: Orthopedic surgeon: Dr. Genelle Do you need transportation to your follow-up appointment?: No Do you understand care options if your condition(s) worsen?: Yes-patient verbalized understanding  SDOH Interventions Today    Flowsheet Row Most Recent Value  SDOH Interventions   Food Insecurity Interventions Intervention Not Indicated  Housing Interventions Intervention Not Indicated  Transportation Interventions Intervention Not Indicated  [Reports family providing transportation after recent planned surgery:apparently she drives self at baseline]  Utilities Interventions Intervention Not Indicated   See TOC assessment tabs for additional assessment/ TOC intervention information  Patient declines need for ongoing/ further care management/ coordination  outreach; declines enrollment in 30-day TOC program- declines taking my direct phone number should needs/ concerns arise post-TOC call   Pls call/ message for questions,  Tenesha Garza Mckinney Sequoyah Ramone, RN, BSN, CCRN Alumnus RN Care Manager  Transitions of Care  [facility] [phone]: direct office

## 2024-04-07 ENCOUNTER — Encounter (HOSPITAL_COMMUNITY): Payer: Self-pay | Admitting: Orthopaedic Surgery

## 2024-04-07 NOTE — Telephone Encounter (Signed)
 LMOM for verbal orders and explained the protocol can be found online at [facility] resources website

## 2024-04-10 ENCOUNTER — Ambulatory Visit: Admitting: Physical Therapy

## 2024-04-11 ENCOUNTER — Telehealth (HOSPITAL_BASED_OUTPATIENT_CLINIC_OR_DEPARTMENT_OTHER): Payer: Self-pay | Admitting: Orthopaedic Surgery

## 2024-04-11 NOTE — Telephone Encounter (Signed)
 Patient wants to know when she should come in for a follow up please advise [phone]

## 2024-04-21 ENCOUNTER — Ambulatory Visit (INDEPENDENT_AMBULATORY_CARE_PROVIDER_SITE_OTHER): Admitting: Orthopaedic Surgery

## 2024-04-21 ENCOUNTER — Ambulatory Visit (HOSPITAL_BASED_OUTPATIENT_CLINIC_OR_DEPARTMENT_OTHER)

## 2024-04-21 DIAGNOSIS — Z96611 Presence of right artificial shoulder joint: Secondary | ICD-10-CM

## 2024-04-21 NOTE — Progress Notes (Signed)
 "                                Post Operative Evaluation    Procedure/Date of Surgery: Revision right shoulder arthroplasty 12/2  Interval History:   Presents 2 weeks status post the above procedure.  Overall she is doing extremely well.  Denies significant pain.  She has been able to establish with physical therapy   PMH/PSH/Family History/Social History/Meds/Allergies:    Past Medical History:  Diagnosis Date   Adenomatous colon polyp    hyperplastic   Anemia    Anginal pain    not recent   Anxiety    Arthritis    NECK, KNEES, FINGERS, TOES   Atrial tachycardia CARDIOLOGIST - DR FERNANDE (LAST VISIT AUG 2012)   Echo 12/11: EF 55-60%, Mild LVH, grade 1 diast dysfxn;   holter 1/12: ATach   Atypical chest pain    a. 07/2004 Cath: Clean cors;  b. 04/2010 Myoview : EF 63%, no ischemia   Blood transfusion without reported diagnosis 1969   CHF (congestive heart failure) (HCC)    Chronic kidney disease    left kidney small    Coronary artery disease    Diabetes mellitus, type 2 (HCC)    ORAL AND INSULIN  MEDS (LAST A1C  7.3)   Diverticulosis    Erythema CURRENT-- CLOSED ABD. WALL ABSCESS   PER PCP NOTE (03-31-11)- MRSA-- TAKES DOXYCYCLINE    GERD (gastroesophageal reflux disease)    CONTROLLED W/ PREVACID   History of kidney stones 2012   Hyperlipidemia    Hypertension    Insomnia    TAKES MEDS PRN   Neuropathy, peripheral    Obesity (BMI 30-39.9) 02/19/2015   Pneumonia    as child   Post op infection 11/07/2012   left bunionectomy   Restless leg syndrome    Sepsis, urinary HISTORY - 2004   Sleep apnea    no cpap    Stroke (HCC)    08-2023. stroke x2   Thyroid  disease    TIA (transient ischemic attack)    mini strokes 2023   Tremor    Vitamin D  deficiency    Past Surgical History:  Procedure Laterality Date   ABDOMINAL HYSTERECTOMY  1995   ovaries remain   BUNIONECTOMY Left 10/24/2012   BUNIONECTOMY Right 05/17/2017   CARDIAC CATHETERIZATION  07/10/2004    CARPAL TUNNEL RELEASE  2000   RIGHT   CATARACT EXTRACTION, BILATERAL     CERVICAL FUSION  10/12/2007   C5 - 7   COLONOSCOPY     CYSTOSCOPY WITH RETROGRADE PYELOGRAM, URETEROSCOPY AND STENT PLACEMENT Left 11/28/2012   Procedure: LEFT RETROGRADE PYELOGRAM, LEFT URETEROSCOPY, ;  Surgeon: Mark C Ottelin, MD;  Location: [facility];  Service: Urology;  Laterality: Left;   CYSTOSCOPY/RETROGRADE/URETEROSCOPY/STONE EXTRACTION WITH BASKET  X2 2004 & 2009   LEFT    EYE SURGERY  Remove Cataracts   GASTRIC BYPASS  1981   JOINT REPLACEMENT     KNEE ARTHROSCOPY  12/2010   LEFT   LAPAROSCOPIC CHOLECYSTECTOMY  2001   LEFT HEART CATH AND CORONARY ANGIOGRAPHY N/A 12/03/2021   Procedure: LEFT HEART CATH AND CORONARY ANGIOGRAPHY;  Surgeon: Dann Candyce RAMAN, MD;  Location: [facility] INVASIVE CV [facility];  Service: Cardiovascular;  Laterality: N/A;   left thumb joint surgery  2013   PERCUTANEOUS NEPHROSTOLITHOTOMY  02/27/2011   LEFT   POLYPECTOMY     REVERSE SHOULDER ARTHROPLASTY Right 07/12/2022  Procedure: REVERSE SHOULDER ARTHROPLASTY;  Surgeon: Genelle Standing, MD;  Location: [facility] OR;  Service: Orthopedics;  Laterality: Right;   REVISION TOTAL SHOULDER TO REVERSE TOTAL SHOULDER Right 04/04/2024   Procedure: REVISION RIGHT SHOULDER ARTHROPLASTY;  Surgeon: Genelle Standing, MD;  Location: [facility] OR;  Service: Orthopedics;  Laterality: Right;   RIGHT THUMB JOINT SURG.  09/2009   SPINE SURGERY     TRIGGER FINGER RELEASE  2010   RIGHT THUMB   TUBAL LIGATION     UPPER GASTROINTESTINAL ENDOSCOPY     URETERAL STENT PLACEMENT  X2  2004  &  2009   LEFT   URETEROSCOPY  04/06/2011   Procedure: URETEROSCOPY;  Surgeon: Oneil JAYSON Rafter, MD;  Location: [facility];  Service: Urology;  Laterality: Left;  L Ureteroscopy Laser Litho & Stent    Social History   Socioeconomic History   Marital status: Married    Spouse name: Not on file   Number of children: 2   Years of education: 12   Highest education level: 12th  grade  Occupational History   Occupation: educational psychologist middle school    Employer: [employer]    Comment: in office   Occupation: Retired  Tobacco Use   Smoking status: Every Day    Current packs/day: 2.00    Average packs/day: 2.0 packs/day for 43.0 years (86.0 ttl pk-yrs)    Types: Cigarettes   Smokeless tobacco: Never   Tobacco comments:    Less than 1/2 ppd per pt  Vaping Use   Vaping status: Never Used  Substance and Sexual Activity   Alcohol use: No    Alcohol/week: 0.0 standard drinks of alcohol   Drug use: No   Sexual activity: Not Currently  Other Topics Concern   Not on file  Social History Narrative   2 children, 2 stepchildren   Lives with husband.   Right-handed.   No daily caffeine.   Social Drivers of Health   Tobacco Use: High Risk (04/04/2024)   Patient History    Smoking Tobacco Use: Every Day    Smokeless Tobacco Use: Never    Passive Exposure: Not on file  Financial Resource Strain: Low Risk (02/25/2024)   Overall Financial Resource Strain (CARDIA)    Difficulty of Paying Living Expenses: Not hard at all  Food Insecurity: No Food Insecurity (04/06/2024)   Epic    Worried About Programme Researcher, Broadcasting/film/video in the Last Year: Never true    Ran Out of Food in the Last Year: Never true  Transportation Needs: No Transportation Needs (04/06/2024)   Epic    Lack of Transportation (Medical): No    Lack of Transportation (Non-Medical): No  Physical Activity: Insufficiently Active (02/25/2024)   Exercise Vital Sign    Days of Exercise per Week: 2 days    Minutes of Exercise per Session: 20 min  Stress: Stress Concern Present (02/25/2024)   [employer] of Occupational Health - Occupational Stress Questionnaire    Feeling of Stress: Rather much  Social Connections: Moderately Integrated (04/05/2024)   Social Connection and Isolation Panel    Frequency of Communication with Friends and Family: More than three times a week    Frequency of Social Gatherings with  Friends and Family: Once a week    Attends Religious Services: More than 4 times per year    Active Member of [employer] or Organizations: No    Attends Engineer, Structural: Not on file    Marital Status: Married  Depression (PHQ2-9): Low Risk (04/06/2024)   Depression (PHQ2-9)    PHQ-2 Score: 1  Alcohol Screen: Low Risk (02/08/2024)   Alcohol Screen    Last Alcohol Screening Score (AUDIT): 0  Housing: Unknown (04/06/2024)   Epic    Unable to Pay for Housing in the Last Year: No    Number of Times Moved in the Last Year: Not on file    Homeless in the Last Year: No  Utilities: Not At Risk (04/06/2024)   Epic    Threatened with loss of utilities: No  Health Literacy: Adequate Health Literacy (02/08/2024)   B1300 Health Literacy    Frequency of need for help with medical instructions: Never   Family History  Problem Relation Age of Onset   Hyperlipidemia Mother    Hypertension Mother    Arthritis Mother    Heart attack Father    Lung disease Father    Early death Father    Heart disease Father    Heart disease Sister    Hypertension Sister    Colon polyps Sister    Cancer Sister    Early death Sister    Hypertension Sister    Colon polyps Sister    Hearing loss Sister    Hypertension Sister    Obesity Sister    Hypertension Sister    Lung cancer Sister 22       stage 4    Obesity Sister    Cancer - Other Daughter        appendiceal caner   Diabetes Other    Breast cancer Other    Heart disease Other    Colon cancer Other 44       nephew   Esophageal cancer Neg Hx    Rectal cancer Neg Hx    Stomach cancer Neg Hx    Amblyopia Neg Hx    Blindness Neg Hx    Cataracts Neg Hx    Glaucoma Neg Hx    Retinal detachment Neg Hx    Strabismus Neg Hx    Retinitis pigmentosa Neg Hx    Allergies[1] Current Outpatient Medications  Medication Sig Dispense Refill   albuterol  (VENTOLIN  HFA) 108 (90 Base) MCG/ACT inhaler Inhale 2 puffs into the lungs every 6 (six) hours  as needed for wheezing or shortness of breath. (Patient not taking: Reported on 04/06/2024) 8 g 0   Alcohol Swabs (DROPSAFE ALCOHOL PREP) 70 % PADS USE AS DIRECTED  AS NEEDED. 200 each 3   aspirin  EC 325 MG tablet Take 1 tablet (325 mg total) by mouth daily. 30 tablet 0   atorvastatin  (LIPITOR) 40 MG tablet Take 1 tablet (40 mg total) by mouth daily. 90 tablet 3   B-D ULTRAFINE III SHORT PEN 31G X 8 MM MISC USE 4 TIMES A DAY 400 each 6   Blood Glucose Monitoring Suppl (TRUE METRIX METER) w/Device KIT Use as directed 3-4 times daily.  Dx- type 2 diabetes w/ use of insulin  1 kit 1   busPIRone  (BUSPAR ) 7.5 MG tablet TAKE 1 TABLET BY MOUTH TWICE A DAY (Patient taking differently: Take 7.5 mg by mouth 2 (two) times daily. 04/06/24: Reports during TOC call takes prn) 180 tablet 0   calcium -vitamin D  (OSCAL WITH D) 500-5 MG-MCG tablet Take 1 tablet by mouth daily with breakfast.     clonazePAM  (KLONOPIN ) 1 MG tablet TAKE 1 TABLET AT BEDTIME FOR RESTLESS LEGS SYNDROME 30 tablet 3   diclofenac  Sodium (VOLTAREN ) 1 % GEL APPLY 2 GRAMS  TO AFFECTED AREA 4 TIMES A DAY 300 g 1   doxycycline  (VIBRA -TABS) 100 MG tablet Take 1 tablet (100 mg total) by mouth 2 (two) times daily. (Patient not taking: Reported on 04/06/2024) 14 tablet 0   fenofibrate  160 MG tablet TAKE 1 TABLET DAILY 90 tablet 3   furosemide  (LASIX ) 20 MG tablet TAKE 1 TABLET EVERY DAY (Patient taking differently: Take 20 mg by mouth daily as needed for fluid. 04/06/24: Reports during TOC call, takes most every day but not every day) 90 tablet 1   gabapentin  (NEURONTIN ) 300 MG capsule TAKE 3 CAPSULES BY MOUTH 3 TIMES DAILY. 270 capsule 11   glucose blood test strip 1 each by Other route as needed for other. Test 4 times daily     glucose blood test strip 1 each by Other route 4 (four) times daily - after meals and at bedtime. Use as instructed 400 each 3   guaiFENesin -codeine  100-10 MG/5ML syrup Take 10 mLs by mouth every 6 (six) hours as needed for  cough. (Patient not taking: Reported on 04/06/2024) 120 mL 0   insulin  aspart (NOVOLOG  FLEXPEN) 100 UNIT/ML FlexPen INJECT 25 UNITS SUBCUTANEOUSLY THREE TIMES DAILY WITH MEALS 75 mL 4   insulin  glargine (LANTUS  SOLOSTAR) 100 UNIT/ML Solostar Pen INJECT 60 UNITS UNDER THE SKIN AT BEDTIME 45 mL 1   Lancets Super Thin 28G MISC Please use as directed to test sugars 4 times daily. Dx. E11.9 420 each 3   lansoprazole (PREVACID) 15 MG capsule Take 15 mg by mouth daily before breakfast.     levothyroxine  (SYNTHROID ) 50 MCG tablet TAKE 1 TABLET BY MOUTH EVERY DAY BEFORE BREAKFAST 90 tablet 1   loperamide  (IMODIUM  A-D) 2 MG tablet Take 2 mg by mouth daily as needed for diarrhea or loose stools. (Patient not taking: Reported on 04/06/2024)     OneTouch UltraSoft 2 Lancets MISC 1 Device by Does not apply route 4 (four) times daily. 100 each 3   oxyCODONE  (ROXICODONE ) 5 MG immediate release tablet Take 1 tablet (5 mg total) by mouth every 4 (four) hours as needed for severe pain (pain score 7-10) or breakthrough pain. 25 tablet 0   primidone  (MYSOLINE ) 50 MG tablet TAKE 2 TABLETS BY MOUTH 2 TIMES DAILY. 360 tablet 0   promethazine -dextromethorphan (PROMETHAZINE -DM) 6.25-15 MG/5ML syrup Take 5 mLs by mouth 4 (four) times daily as needed. (Patient not taking: Reported on 04/06/2024) 180 mL 0   sertraline  (ZOLOFT ) 100 MG tablet TAKE 1 TABLET DAILY 90 tablet 3   traZODone  (DESYREL ) 100 MG tablet TAKE 1 TABLET AT BEDTIME 90 tablet 3   valsartan  (DIOVAN ) 320 MG tablet TAKE 1 TABLET DAILY 90 tablet 2   verapamil  (CALAN -SR) 120 MG CR tablet TAKE 1 TABLET BY MOUTH EVERYDAY AT BEDTIME 90 tablet 1   Vibegron (GEMTESA) 75 MG TABS Take 75 mg by mouth daily.     No current facility-administered medications for this visit.   No results found.  Review of Systems:   A ROS was performed including pertinent positives and negatives as documented in the HPI.   Musculoskeletal Exam:    Right shoulder incisions well-appearing  without erythema or drainage.  In the setting position she can forward elevate to approximately 40 degrees external rotation at the the side to 30 degrees distal neurosensory exam is intact  Imaging:    3 views right shoulder: Post right shoulder reverse shoulder arthroplasty revision no evidence of complication  I personally reviewed and interpreted the radiographs.  Assessment:   2 weeks status post right shoulder reverse shoulder arthroplasty overall doing extremely well.  At this time she will continue to progress through physical therapy and range of motion  Plan :    - Return to clinic 4 weeks for reassessment      I personally saw and evaluated the patient, and participated in the management and treatment plan.  Elspeth Parker, MD Attending Physician, Orthopedic Surgery  This document was dictated using Dragon voice recognition software. A reasonable attempt at proof reading has been made to minimize errors.    [1] No Known Allergies  "

## 2024-04-30 ENCOUNTER — Other Ambulatory Visit: Payer: Self-pay | Admitting: Family Medicine

## 2024-05-03 ENCOUNTER — Other Ambulatory Visit: Payer: Self-pay | Admitting: Family Medicine

## 2024-05-23 ENCOUNTER — Ambulatory Visit: Payer: Self-pay

## 2024-05-23 NOTE — Telephone Encounter (Signed)
 FYI Only or Action Required?: FYI only for provider: appointment scheduled on tomorrow.  Patient was last seen in primary care on 03/23/2024 by Lacey Comer BRAVO, MD.  Called Nurse Triage reporting Hypertension.  Symptoms began today.  Interventions attempted: Nothing.  Symptoms are: unchanged.  Triage Disposition: See HCP Within 4 Hours (Or PCP Triage)  Patient/caregiver understands and will follow disposition?: No, wishes to speak with PCP  Reason for Triage: 200/68 is blood pressure , no symptoms    Reason for Disposition  [1] Systolic BP >= 200 OR Diastolic >= 120 AND [2] having NO cardiac or neurologic symptoms  Answer Assessment - Initial Assessment Questions 1. BLOOD PRESSURE: What is your blood pressure? Did you take at least two measurements 5 minutes apart?     194/80, 200/68 2. ONSET: When did you take your blood pressure?     Today, 3. HOW: How did you take your blood pressure? (e.g., automatic home BP monitor, visiting nurse)     Manual HH PT 4. HISTORY: Do you have a history of high blood pressure?     yes 5. MEDICINES: Are you taking any medicines for blood pressure? Have you missed any doses recently?     No missed doses 6. OTHER SYMPTOMS: Do you have any symptoms? (e.g., blurred vision, chest pain, difficulty breathing, headache, weakness)     Denies  Pt offered appt today, declined d/t provider it was with as well as lack of transportation. Pt prior BP's with home health PT:  1/16 140/80 1/8 137/60  Protocols used: Blood Pressure - High-A-AH

## 2024-05-23 NOTE — Telephone Encounter (Signed)
 Appt scheduled for tomorrow

## 2024-05-24 ENCOUNTER — Ambulatory Visit: Admitting: Family Medicine

## 2024-05-24 ENCOUNTER — Ambulatory Visit (INDEPENDENT_AMBULATORY_CARE_PROVIDER_SITE_OTHER): Admitting: Orthopaedic Surgery

## 2024-05-24 ENCOUNTER — Encounter: Payer: Self-pay | Admitting: Family Medicine

## 2024-05-24 VITALS — BP 142/58 | HR 95 | Ht 62.0 in | Wt 215.0 lb

## 2024-05-24 DIAGNOSIS — I10 Essential (primary) hypertension: Secondary | ICD-10-CM

## 2024-05-24 DIAGNOSIS — Z96611 Presence of right artificial shoulder joint: Secondary | ICD-10-CM

## 2024-05-24 NOTE — Progress Notes (Signed)
 "                                Post Operative Evaluation    Procedure/Date of Surgery: Revision right shoulder arthroplasty 12/2  Interval History:   Presents complaining of spine6 weeks status post the above procedure.  Overall she is doing extremely well.  Denies significant pain.  She has been able to establish with physical therapy   PMH/PSH/Family History/Social History/Meds/Allergies:    Past Medical History:  Diagnosis Date   Adenomatous colon polyp    hyperplastic   Anemia    Anginal pain    not recent   Anxiety    Arthritis    NECK, KNEES, FINGERS, TOES   Atrial tachycardia CARDIOLOGIST - DR FERNANDE (LAST VISIT AUG 2012)   Echo 12/11: EF 55-60%, Mild LVH, grade 1 diast dysfxn;   holter 1/12: ATach   Atypical chest pain    a. 07/2004 Cath: Clean cors;  b. 04/2010 Myoview : EF 63%, no ischemia   Blood transfusion without reported diagnosis 1969   CHF (congestive heart failure) (HCC)    Chronic kidney disease    left kidney small    Coronary artery disease    Diabetes mellitus, type 2 (HCC)    ORAL AND INSULIN  MEDS (LAST A1C  7.3)   Diverticulosis    Erythema CURRENT-- CLOSED ABD. WALL ABSCESS   PER PCP NOTE (03-31-11)- MRSA-- TAKES DOXYCYCLINE    GERD (gastroesophageal reflux disease)    CONTROLLED W/ PREVACID   History of kidney stones 2012   Hyperlipidemia    Hypertension    Insomnia    TAKES MEDS PRN   Neuropathy, peripheral    Obesity (BMI 30-39.9) 02/19/2015   Pneumonia    as child   Post op infection 11/07/2012   left bunionectomy   Restless leg syndrome    Sepsis, urinary HISTORY - 2004   Sleep apnea    no cpap    Stroke (HCC)    08-2023. stroke x2   Thyroid  disease    TIA (transient ischemic attack)    mini strokes 2023   Tremor    Vitamin D  deficiency    Past Surgical History:  Procedure Laterality Date   ABDOMINAL HYSTERECTOMY  1995   ovaries remain   BUNIONECTOMY Left 10/24/2012   BUNIONECTOMY Right 05/17/2017   CARDIAC  CATHETERIZATION  07/10/2004   CARPAL TUNNEL RELEASE  2000   RIGHT   CATARACT EXTRACTION, BILATERAL     CERVICAL FUSION  10/12/2007   C5 - 7   COLONOSCOPY     CYSTOSCOPY WITH RETROGRADE PYELOGRAM, URETEROSCOPY AND STENT PLACEMENT Left 11/28/2012   Procedure: LEFT RETROGRADE PYELOGRAM, LEFT URETEROSCOPY, ;  Surgeon: Mark C Ottelin, MD;  Location: [facility];  Service: Urology;  Laterality: Left;   CYSTOSCOPY/RETROGRADE/URETEROSCOPY/STONE EXTRACTION WITH BASKET  X2 2004 & 2009   LEFT    EYE SURGERY  Remove Cataracts   GASTRIC BYPASS  1981   JOINT REPLACEMENT     KNEE ARTHROSCOPY  12/2010   LEFT   LAPAROSCOPIC CHOLECYSTECTOMY  2001   LEFT HEART CATH AND CORONARY ANGIOGRAPHY N/A 12/03/2021   Procedure: LEFT HEART CATH AND CORONARY ANGIOGRAPHY;  Surgeon: Dann Candyce RAMAN, MD;  Location: [facility] INVASIVE CV [facility];  Service: Cardiovascular;  Laterality: N/A;   left thumb joint surgery  2013   PERCUTANEOUS NEPHROSTOLITHOTOMY  02/27/2011   LEFT   POLYPECTOMY     REVERSE SHOULDER ARTHROPLASTY Right  07/12/2022   Procedure: REVERSE SHOULDER ARTHROPLASTY;  Surgeon: Genelle Standing, MD;  Location: [facility];  Service: Orthopedics;  Laterality: Right;   REVISION TOTAL SHOULDER TO REVERSE TOTAL SHOULDER Right 04/04/2024   Procedure: REVISION RIGHT SHOULDER ARTHROPLASTY;  Surgeon: Genelle Standing, MD;  Location: [facility];  Service: Orthopedics;  Laterality: Right;   RIGHT THUMB JOINT SURG.  09/2009   SPINE SURGERY     TRIGGER FINGER RELEASE  2010   RIGHT THUMB   TUBAL LIGATION     UPPER GASTROINTESTINAL ENDOSCOPY     URETERAL STENT PLACEMENT  X2  2004  &  2009   LEFT   URETEROSCOPY  04/06/2011   Procedure: URETEROSCOPY;  Surgeon: Oneil JAYSON Rafter, MD;  Location: [facility];  Service: Urology;  Laterality: Left;  L Ureteroscopy Laser Litho & Stent    Social History   Socioeconomic History   Marital status: Married    Spouse name: Not on file   Number of children: 2   Years of education: 12    Highest education level: 12th grade  Occupational History   Occupation: educational psychologist middle school    Employer: [employer]    Comment: in office   Occupation: Retired  Tobacco Use   Smoking status: Every Day    Current packs/day: 2.00    Average packs/day: 2.0 packs/day for 43.0 years (86.0 ttl pk-yrs)    Types: Cigarettes   Smokeless tobacco: Never   Tobacco comments:    Less than 1/2 ppd per pt  Vaping Use   Vaping status: Never Used  Substance and Sexual Activity   Alcohol use: No    Alcohol/week: 0.0 standard drinks of alcohol   Drug use: No   Sexual activity: Not Currently  Other Topics Concern   Not on file  Social History Narrative   2 children, 2 stepchildren   Lives with husband.   Right-handed.   No daily caffeine.   Social Drivers of Health   Tobacco Use: High Risk (05/24/2024)   Patient History    Smoking Tobacco Use: Every Day    Smokeless Tobacco Use: Never    Passive Exposure: Not on file  Financial Resource Strain: Low Risk (02/25/2024)   Overall Financial Resource Strain (CARDIA)    Difficulty of Paying Living Expenses: Not hard at all  Food Insecurity: No Food Insecurity (04/06/2024)   Epic    Worried About Programme Researcher, Broadcasting/film/video in the Last Year: Never true    Ran Out of Food in the Last Year: Never true  Transportation Needs: No Transportation Needs (04/06/2024)   Epic    Lack of Transportation (Medical): No    Lack of Transportation (Non-Medical): No  Physical Activity: Insufficiently Active (02/25/2024)   Exercise Vital Sign    Days of Exercise per Week: 2 days    Minutes of Exercise per Session: 20 min  Stress: Stress Concern Present (02/25/2024)   [employer] of Occupational Health - Occupational Stress Questionnaire    Feeling of Stress: Rather much  Social Connections: Moderately Integrated (04/05/2024)   Social Connection and Isolation Panel    Frequency of Communication with Friends and Family: More than three times a week     Frequency of Social Gatherings with Friends and Family: Once a week    Attends Religious Services: More than 4 times per year    Active Member of [employer] or Organizations: No    Attends Banker Meetings: Not on file    Marital  Status: Married  Depression (PHQ2-9): Low Risk (04/06/2024)   Depression (PHQ2-9)    PHQ-2 Score: 1  Alcohol Screen: Low Risk (02/08/2024)   Alcohol Screen    Last Alcohol Screening Score (AUDIT): 0  Housing: Unknown (04/06/2024)   Epic    Unable to Pay for Housing in the Last Year: No    Number of Times Moved in the Last Year: Not on file    Homeless in the Last Year: No  Utilities: Not At Risk (04/06/2024)   Epic    Threatened with loss of utilities: No  Health Literacy: Adequate Health Literacy (02/08/2024)   B1300 Health Literacy    Frequency of need for help with medical instructions: Never   Family History  Problem Relation Age of Onset   Hyperlipidemia Mother    Hypertension Mother    Arthritis Mother    Heart attack Father    Lung disease Father    Early death Father    Heart disease Father    Heart disease Sister    Hypertension Sister    Colon polyps Sister    Cancer Sister    Early death Sister    Hypertension Sister    Colon polyps Sister    Hearing loss Sister    Hypertension Sister    Obesity Sister    Hypertension Sister    Lung cancer Sister 48       stage 4    Obesity Sister    Cancer - Other Daughter        appendiceal caner   Diabetes Other    Breast cancer Other    Heart disease Other    Colon cancer Other 23       nephew   Esophageal cancer Neg Hx    Rectal cancer Neg Hx    Stomach cancer Neg Hx    Amblyopia Neg Hx    Blindness Neg Hx    Cataracts Neg Hx    Glaucoma Neg Hx    Retinal detachment Neg Hx    Strabismus Neg Hx    Retinitis pigmentosa Neg Hx    Allergies[1] Current Outpatient Medications  Medication Sig Dispense Refill   albuterol  (VENTOLIN  HFA) 108 (90 Base) MCG/ACT inhaler Inhale 2 puffs  into the lungs every 6 (six) hours as needed for wheezing or shortness of breath. 8 g 0   Alcohol Swabs (DROPSAFE ALCOHOL PREP) 70 % PADS USE AS DIRECTED  AS NEEDED. 200 each 3   aspirin  EC 325 MG tablet Take 1 tablet (325 mg total) by mouth daily. 30 tablet 0   atorvastatin  (LIPITOR) 40 MG tablet Take 1 tablet (40 mg total) by mouth daily. 90 tablet 3   B-D ULTRAFINE III SHORT PEN 31G X 8 MM MISC USE 4 TIMES A DAY 400 each 6   Blood Glucose Monitoring Suppl (TRUE METRIX METER) w/Device KIT Use as directed 3-4 times daily.  Dx- type 2 diabetes w/ use of insulin  1 kit 1   busPIRone  (BUSPAR ) 7.5 MG tablet TAKE 1 TABLET BY MOUTH TWICE A DAY (Patient taking differently: Take 7.5 mg by mouth 2 (two) times daily. 04/06/24: Reports during TOC call takes prn) 180 tablet 0   calcium -vitamin D  (OSCAL WITH D) 500-5 MG-MCG tablet Take 1 tablet by mouth daily with breakfast.     clonazePAM  (KLONOPIN ) 1 MG tablet TAKE 1 TABLET AT BEDTIME FOR RESTLESS LEGS SYNDROME 30 tablet 3   diclofenac  Sodium (VOLTAREN ) 1 % GEL APPLY 2 GRAMS TO AFFECTED AREA  4 TIMES A DAY 300 g 1   fenofibrate  160 MG tablet TAKE 1 TABLET DAILY 90 tablet 3   furosemide  (LASIX ) 20 MG tablet TAKE 1 TABLET EVERY DAY (Patient taking differently: Take 20 mg by mouth daily as needed for fluid. 04/06/24: Reports during TOC call, takes most every day but not every day) 90 tablet 1   gabapentin  (NEURONTIN ) 300 MG capsule TAKE 3 CAPSULES BY MOUTH 3 TIMES DAILY. 270 capsule 11   glucose blood test strip 1 each by Other route 4 (four) times daily - after meals and at bedtime. Use as instructed 400 each 3   insulin  aspart (NOVOLOG  FLEXPEN) 100 UNIT/ML FlexPen INJECT 25 UNITS SUBCUTANEOUSLY THREE TIMES DAILY WITH MEALS 75 mL 4   insulin  glargine (LANTUS  SOLOSTAR) 100 UNIT/ML Solostar Pen INJECT 60 UNITS UNDER THE SKIN AT BEDTIME 45 mL 1   lansoprazole (PREVACID) 15 MG capsule Take 15 mg by mouth daily before breakfast.     levothyroxine  (SYNTHROID ) 50 MCG  tablet TAKE 1 TABLET BY MOUTH EVERY DAY BEFORE BREAKFAST 90 tablet 1   loperamide  (IMODIUM  A-D) 2 MG tablet Take 2 mg by mouth daily as needed for diarrhea or loose stools.     OneTouch UltraSoft 2 Lancets MISC 1 Device by Does not apply route 4 (four) times daily. 100 each 3   oxyCODONE  (ROXICODONE ) 5 MG immediate release tablet Take 1 tablet (5 mg total) by mouth every 4 (four) hours as needed for severe pain (pain score 7-10) or breakthrough pain. 25 tablet 0   primidone  (MYSOLINE ) 50 MG tablet TAKE 2 TABLETS BY MOUTH 2 TIMES DAILY. 360 tablet 0   sertraline  (ZOLOFT ) 100 MG tablet TAKE 1 TABLET DAILY 90 tablet 3   traZODone  (DESYREL ) 100 MG tablet TAKE 1 TABLET AT BEDTIME 90 tablet 3   valsartan  (DIOVAN ) 320 MG tablet TAKE 1 TABLET DAILY 90 tablet 2   verapamil  (CALAN -SR) 120 MG CR tablet TAKE 1 TABLET BY MOUTH EVERYDAY AT BEDTIME 90 tablet 1   Vibegron (GEMTESA) 75 MG TABS Take 75 mg by mouth daily.     No current facility-administered medications for this visit.   No results found.  Review of Systems:   A ROS was performed including pertinent positives and negatives as documented in the HPI.   Musculoskeletal Exam:    Right shoulder incisions well-appearing without erythema or drainage.  In the setting position she can forward elevate to approximately 40 degrees external rotation at the the side to 30 degrees distal neurosensory exam is intact  Imaging:    3 views right shoulder: Post right shoulder reverse shoulder arthroplasty revision no evidence of complication  I personally reviewed and interpreted the radiographs.   Assessment:   6 weeks status post right shoulder reverse shoulder arthroplasty overall doing extremely well.  At this time should continue to work on active range of motion as well as strengthening I will plan to see her back in 6 weeks for reassessment  Plan :    - Return to clinic 6 7 evidence of no Grayce is the one who like weeks for  reassessment      I personally saw and evaluated the patient, and participated in the management and treatment plan.  Elspeth Parker, MD Attending Physician, Orthopedic Surgery  This document was dictated using Dragon voice recognition software. A reasonable attempt at proof reading has been made to minimize errors.     [1] No Known Allergies  "

## 2024-05-24 NOTE — Assessment & Plan Note (Signed)
 Ongoing issue.  BP is mildly elevated today but not that out of range.  Last visit was 134/72.  I worry that if medication adjustments are made, she will have dizziness or hypotension.  She was asymptomatic yesterday w/ high readings and I'm not sure how accurate those readings were.  The pharmacy did just send her a supply of Dilt 180mg  (rather than 120mg ) and I told her that given her mildly elevated BP and HR, it would be safe to take the higher dose.  Pt expressed understanding and is in agreement w/ plan.

## 2024-05-24 NOTE — Progress Notes (Signed)
" ° °  Subjective:    Patient ID: Lacey [name], female    DOB: July 10, 1950, 74 y.o.   MRN: [id number]  HPI HTN- ongoing issue for pt.  PT was at house yesterday and took BP and it was 196/90.  She took BP this morning and it was 171/80s.  Currently on Valsartan  320mg  daily, Verapamil  120mg  daily, Lasix  daily.  Today BP is 142/58.  At last visit BP was 134/72.  Did just receive mail order medication for Verapamil  180mg  (which she has not been taking since 2023).  No CP, SOB, HA's, visual changes, edema.   Review of Systems For ROS see HPI     Objective:   Physical Exam Vitals reviewed.  Constitutional:      General: She is not in acute distress.    Appearance: Normal appearance. She is well-developed. She is not ill-appearing.  HENT:     Head: Normocephalic and atraumatic.  Eyes:     Conjunctiva/sclera: Conjunctivae normal.     Pupils: Pupils are equal, round, and reactive to light.  Neck:     Thyroid : No thyromegaly.  Cardiovascular:     Rate and Rhythm: Normal rate and regular rhythm.     Pulses: Normal pulses.     Heart sounds: Normal heart sounds. No murmur heard. Pulmonary:     Effort: Pulmonary effort is normal. No respiratory distress.     Breath sounds: Normal breath sounds.  Abdominal:     General: There is no distension.     Palpations: Abdomen is soft.     Tenderness: There is no abdominal tenderness.  Musculoskeletal:     Cervical back: Normal range of motion and neck supple.     Right lower leg: No edema.     Left lower leg: No edema.  Lymphadenopathy:     Cervical: No cervical adenopathy.  Skin:    General: Skin is warm and dry.  Neurological:     General: No focal deficit present.     Mental Status: She is alert and oriented to person, place, and time.  Psychiatric:        Mood and Affect: Mood normal.        Behavior: Behavior normal.        Thought Content: Thought content normal.           Assessment & Plan:    "

## 2024-05-24 NOTE — Patient Instructions (Signed)
 Follow up as needed or as scheduled No need for blood work or med changes today- yay!!! Continue the Valsartan , Verapamil , and Lasix  as directed Call with any questions or concerns Stay Safe!  Stay Healthy! Happy [birth year] Birthday!!!

## 2024-05-26 ENCOUNTER — Encounter (HOSPITAL_BASED_OUTPATIENT_CLINIC_OR_DEPARTMENT_OTHER): Admitting: Orthopaedic Surgery

## 2024-05-28 ENCOUNTER — Other Ambulatory Visit: Payer: Self-pay | Admitting: Family Medicine

## 2024-06-01 ENCOUNTER — Telehealth: Payer: Self-pay | Admitting: Orthopaedic Surgery

## 2024-06-01 ENCOUNTER — Ambulatory Visit: Payer: Self-pay

## 2024-06-01 NOTE — Telephone Encounter (Signed)
 FYI Only or Action Required?: FYI only for provider: appointment scheduled on 06/02/24.  Patient was last seen in primary care on 05/24/2024 by Mahlon Comer BRAVO, MD.  Called Nurse Triage reporting Dizziness and Fall.  Symptoms began yesterday.  Interventions attempted: Nothing.  Symptoms are: unchanged.  Triage Disposition: See Physician Within 24 Hours  Patient/caregiver understands and will follow disposition?: Yes    Message from [place] C sent at 06/01/2024  5:14 PM EST  Reason for Triage: Patient states all day since this morning she is unable to control her balance, she is getting very dizzy, and was just standing and completely fell over     Reason for Disposition  [1] MODERATE dizziness (e.g., interferes with normal activities) AND [2] has NOT been evaluated by doctor (or NP/PA) for this  (Exception: Dizziness caused by heat exposure, sudden standing, or poor fluid intake.)  Answer Assessment - Initial Assessment Questions Pt called to report dizziness and room spinning/tilting that started last night. Pt states when she laid down to go to bed she noticed symptoms but was able to fall asleep. She woke up at 3am and when she sat on the side of the bed to go to the bathroom, noticed she was having trembling in her legs. Pt suspected low blood glucose levels but when she check, BG 138. Pt states she has monitored it all day and levels are WNL. Pt did report a fall about 1h ago when she fell and slid down her side table. Pt states she landed on her R side and shoulder. No injury or bruises at this time; pt worried as 04/04/24 pt had complete shoulder replacement and she is currently having mild pain in shoulder. Pt is no longer taking pain medication at this time. Pt states when dizziness occurs she stops walking and holds onto whatever is closest to her as the room is spinning. Pt denies hx of dizziness, no issues with BP. Discussed importance of safety and increasing fluids.  Appointment scheduled for evaluation and transportation arranged so pt is not driving. Patient agrees with plan of care, and will call back if anything changes, or if symptoms worsen.       1. DESCRIPTION: Describe your dizziness.     Wooziness, weakness, room spinning   2. LIGHTHEADED: Do you feel lightheaded? (e.g., somewhat faint, woozy, weak upon standing)     Woozy, weak/trembling   3. VERTIGO: Do you feel like either you or the room is spinning or tilting? (i.e., vertigo)     Yes, spinning. Pt reports she has to stop and hold on to whatever is closest to her   4. SEVERITY: How bad is it?  Do you feel like you are going to faint? Can you stand and walk?     Pt is able to walk but reports falling about 1h ago. Pt states she feel on her R side, hitting her shoulder which she did have a complete shoulder replacement on 04/04/25. Mild to mod pain, no bruising, no physical injury   5. ONSET:  When did the dizziness begin?     Last night when pt when to bed   6. AGGRAVATING FACTORS: Does anything make it worse? (e.g., standing, change in head position)     Standing   8. CAUSE: What do you think is causing the dizziness? (e.g., decreased fluids or food, diarrhea, emotional distress, heat exposure, new medicine, sudden standing, vomiting; unknown)     Unsure; pt suspected blood sugar but has been WNL  all day. At 3am when pt woke up and had leg trembling, blood glucose 138  9. RECURRENT SYMPTOM: Have you had dizziness before? If Yes, ask: When was the last time? What happened that time?     No   10. OTHER SYMPTOMS: Do you have any other symptoms? (e.g., fever, chest pain, vomiting, diarrhea, bleeding)     No  Protocols used: Dizziness - Lightheadedness-A-AH

## 2024-06-01 NOTE — Telephone Encounter (Signed)
 Terry from [employer]. He would like verbal PT orders for 1x/wk for 9 wks. His cb# [phone]

## 2024-06-02 ENCOUNTER — Ambulatory Visit: Admitting: Family Medicine

## 2024-06-02 ENCOUNTER — Encounter: Payer: Self-pay | Admitting: Family Medicine

## 2024-06-02 DIAGNOSIS — R42 Dizziness and giddiness: Secondary | ICD-10-CM

## 2024-06-02 DIAGNOSIS — S3011XA Contusion of abdominal wall, initial encounter: Secondary | ICD-10-CM | POA: Diagnosis not present

## 2024-06-02 MED ORDER — MECLIZINE HCL 25 MG PO TABS: 25.0000 mg | ORAL_TABLET | Freq: Three times a day (TID) | ORAL | 0 refills | Status: AC | PRN

## 2024-06-02 NOTE — Patient Instructions (Signed)
 Follow up as needed or as scheduled USE the Meclizine  as needed for dizziness INCREASE your fluid intake to avoid dehydration Change positions SLOWLY.  Allow your body time to adjust Do the exercises after a few days to help desensitize your inner ear canal USE a heating pad to help with the bruising Call your Orthopedic doctor next week- weather permitting Call with any questions or concerns Hang in there!

## 2024-06-02 NOTE — Telephone Encounter (Signed)
 Spoke with Lacey [name] for verbal orders

## 2024-06-02 NOTE — Telephone Encounter (Signed)
 FYI patient has appt this AM

## 2024-06-02 NOTE — Progress Notes (Signed)
" ° °  Subjective:    Patient ID: Lacey [name], female    DOB: 1950-06-20, 74 y.o.   MRN: [id number]  HPI Dizziness- pt reports sxs are like 'spinning'.  Sxs started Wednesday night upon lying down.  Sxs are episodic and when they occur, she has to hold onto something to keep her balance.  Pt admits she has been having some trouble getting blood to flow when checking her sugar.  Has 8 oz of water  at breakfast, 8 oz at super, 12 oz of diet caffeine free soda.  Did fall yesterday and hit the side table- has large bruise on L abdomen.   Review of Systems For ROS see HPI     Objective:   Physical Exam Vitals reviewed.  Constitutional:      General: She is not in acute distress.    Appearance: Normal appearance. She is not ill-appearing.  HENT:     Head: Normocephalic and atraumatic.     Right Ear: Tympanic membrane and ear canal normal.     Left Ear: Tympanic membrane and ear canal normal.     Nose:     Comments: No TTP over frontal or maxillary sinuses Eyes:     Extraocular Movements: Extraocular movements intact.     Conjunctiva/sclera: Conjunctivae normal.     Comments: 4-5 beats of horizontal nystagmus when looking L  Cardiovascular:     Rate and Rhythm: Normal rate and regular rhythm.  Pulmonary:     Effort: Pulmonary effort is normal. No respiratory distress.     Breath sounds: No wheezing.  Musculoskeletal:     Cervical back: Normal range of motion and neck supple.  Lymphadenopathy:     Cervical: No cervical adenopathy.  Skin:    General: Skin is warm and dry.     Findings: Bruising (large bruise along L abd/flank, TTP) present.  Neurological:     General: No focal deficit present.     Mental Status: She is alert and oriented to person, place, and time.  Psychiatric:        Mood and Affect: Mood normal.        Behavior: Behavior normal.        Thought Content: Thought content normal.           Assessment & Plan:  Vertigo- new.  Pt's sxs and horizontal nystagmus on PE  are consistent w/ positional vertigo.  Thankfully no red flags on hx or PE.  Reviewed dx w/ pt and son.  Suspect dehydration is playing a role.  Encouraged increased water  intake, changing positions slowly, using Meclizine  prn, and handout given to start modified Epley maneuver in the next few days.  Pt expressed understanding and is in agreement w/ plan.   Ecchymosis- new.  Abd wall after hitting the side table.  Thankfully no evidence of firm fluid collection or cellulitis.  Encouraged heat.  Pt expressed understanding and is in agreement w/ plan.   "

## 2024-06-05 ENCOUNTER — Ambulatory Visit: Admitting: Neurology

## 2024-06-09 ENCOUNTER — Other Ambulatory Visit: Payer: Self-pay | Admitting: Neurology

## 2024-06-09 DIAGNOSIS — R251 Tremor, unspecified: Secondary | ICD-10-CM

## 2024-06-13 ENCOUNTER — Ambulatory Visit: Admitting: Neurology

## 2024-06-21 ENCOUNTER — Ambulatory Visit: Admitting: Neurology

## 2024-06-27 ENCOUNTER — Ambulatory Visit: Admitting: Family Medicine

## 2024-07-05 ENCOUNTER — Ambulatory Visit (HOSPITAL_BASED_OUTPATIENT_CLINIC_OR_DEPARTMENT_OTHER): Admitting: Orthopaedic Surgery

## 2025-02-20 ENCOUNTER — Ambulatory Visit
